# Patient Record
Sex: Female | Born: 1935
Health system: Southern US, Community
[De-identification: ages and names within clinical notes are randomized; demographics above are authoritative.]

## PROBLEM LIST (undated history)

## (undated) DIAGNOSIS — R0602 Shortness of breath: Secondary | ICD-10-CM

## (undated) DIAGNOSIS — T4145XA Adverse effect of unspecified anesthetic, initial encounter: Secondary | ICD-10-CM

## (undated) DIAGNOSIS — I10 Essential (primary) hypertension: Secondary | ICD-10-CM

## (undated) DIAGNOSIS — T8859XA Other complications of anesthesia, initial encounter: Secondary | ICD-10-CM

## (undated) DIAGNOSIS — I639 Cerebral infarction, unspecified: Secondary | ICD-10-CM

## (undated) DIAGNOSIS — E079 Disorder of thyroid, unspecified: Secondary | ICD-10-CM

## (undated) DIAGNOSIS — F329 Major depressive disorder, single episode, unspecified: Secondary | ICD-10-CM

## (undated) DIAGNOSIS — M1990 Unspecified osteoarthritis, unspecified site: Secondary | ICD-10-CM

## (undated) DIAGNOSIS — F32A Depression, unspecified: Secondary | ICD-10-CM

## (undated) DIAGNOSIS — G5600 Carpal tunnel syndrome, unspecified upper limb: Secondary | ICD-10-CM

## (undated) DIAGNOSIS — E785 Hyperlipidemia, unspecified: Secondary | ICD-10-CM

## (undated) HISTORY — DX: Major depressive disorder, single episode, unspecified: F32.9

## (undated) HISTORY — PX: BACK SURGERY: SHX140

## (undated) HISTORY — DX: Depression, unspecified: F32.A

## (undated) HISTORY — DX: Carpal tunnel syndrome, unspecified upper limb: G56.00

## (undated) HISTORY — DX: Hyperlipidemia, unspecified: E78.5

## (undated) HISTORY — DX: Shortness of breath: R06.02

## (undated) HISTORY — PX: CHOLECYSTECTOMY: SHX55

## (undated) HISTORY — PX: HERNIA REPAIR: SHX51

## (undated) HISTORY — PX: APPENDECTOMY: SHX54

---

## 2003-10-23 ENCOUNTER — Other Ambulatory Visit: Payer: Self-pay

## 2003-10-25 ENCOUNTER — Other Ambulatory Visit: Payer: Self-pay

## 2004-03-15 ENCOUNTER — Ambulatory Visit: Payer: Self-pay | Admitting: Internal Medicine

## 2004-12-01 ENCOUNTER — Emergency Department: Payer: Self-pay | Admitting: Emergency Medicine

## 2005-01-06 ENCOUNTER — Ambulatory Visit: Payer: Self-pay

## 2005-02-03 ENCOUNTER — Ambulatory Visit: Payer: Self-pay | Admitting: Internal Medicine

## 2005-03-27 ENCOUNTER — Ambulatory Visit: Payer: Self-pay | Admitting: General Surgery

## 2005-03-28 ENCOUNTER — Ambulatory Visit: Payer: Self-pay | Admitting: General Surgery

## 2005-10-30 ENCOUNTER — Ambulatory Visit: Payer: Self-pay | Admitting: Internal Medicine

## 2005-12-02 ENCOUNTER — Ambulatory Visit: Payer: Self-pay | Admitting: Internal Medicine

## 2006-02-05 ENCOUNTER — Ambulatory Visit: Payer: Self-pay | Admitting: Internal Medicine

## 2006-02-18 ENCOUNTER — Ambulatory Visit: Payer: Self-pay | Admitting: Internal Medicine

## 2006-02-25 ENCOUNTER — Ambulatory Visit: Payer: Self-pay | Admitting: Internal Medicine

## 2006-03-11 ENCOUNTER — Ambulatory Visit: Payer: Self-pay

## 2006-06-03 ENCOUNTER — Ambulatory Visit: Payer: Self-pay | Admitting: Family Medicine

## 2006-06-05 ENCOUNTER — Ambulatory Visit: Payer: Self-pay | Admitting: Family Medicine

## 2006-06-18 ENCOUNTER — Ambulatory Visit: Payer: Self-pay | Admitting: Obstetrics and Gynecology

## 2006-09-08 ENCOUNTER — Ambulatory Visit: Payer: Self-pay | Admitting: Obstetrics and Gynecology

## 2006-09-15 ENCOUNTER — Ambulatory Visit: Payer: Self-pay | Admitting: Gynecologic Oncology

## 2006-11-16 ENCOUNTER — Ambulatory Visit: Payer: Self-pay | Admitting: General Surgery

## 2006-11-25 ENCOUNTER — Ambulatory Visit: Payer: Self-pay | Admitting: Family Medicine

## 2006-12-26 ENCOUNTER — Ambulatory Visit: Payer: Self-pay | Admitting: General Practice

## 2007-01-05 ENCOUNTER — Ambulatory Visit: Payer: Self-pay | Admitting: Internal Medicine

## 2007-01-14 ENCOUNTER — Ambulatory Visit: Payer: Self-pay | Admitting: Pain Medicine

## 2007-02-11 ENCOUNTER — Ambulatory Visit: Payer: Self-pay | Admitting: Pain Medicine

## 2007-03-08 ENCOUNTER — Ambulatory Visit: Payer: Self-pay | Admitting: Physician Assistant

## 2007-03-22 ENCOUNTER — Ambulatory Visit: Payer: Self-pay | Admitting: Pain Medicine

## 2007-03-23 ENCOUNTER — Ambulatory Visit: Payer: Self-pay | Admitting: Pain Medicine

## 2007-05-18 ENCOUNTER — Ambulatory Visit: Payer: Self-pay | Admitting: Pain Medicine

## 2007-06-03 ENCOUNTER — Ambulatory Visit: Payer: Self-pay | Admitting: Physician Assistant

## 2007-07-13 ENCOUNTER — Ambulatory Visit: Payer: Self-pay | Admitting: Unknown Physician Specialty

## 2007-07-30 ENCOUNTER — Other Ambulatory Visit: Payer: Self-pay

## 2007-07-30 ENCOUNTER — Ambulatory Visit: Payer: Self-pay | Admitting: Unknown Physician Specialty

## 2007-08-03 ENCOUNTER — Inpatient Hospital Stay: Payer: Self-pay | Admitting: Unknown Physician Specialty

## 2007-08-05 ENCOUNTER — Other Ambulatory Visit: Payer: Self-pay

## 2007-08-12 ENCOUNTER — Encounter: Payer: Self-pay | Admitting: Internal Medicine

## 2008-04-11 ENCOUNTER — Emergency Department: Payer: Self-pay

## 2008-04-18 ENCOUNTER — Ambulatory Visit: Payer: Self-pay | Admitting: Internal Medicine

## 2008-10-17 ENCOUNTER — Ambulatory Visit: Payer: Self-pay | Admitting: Internal Medicine

## 2008-10-17 DIAGNOSIS — K8020 Calculus of gallbladder without cholecystitis without obstruction: Secondary | ICD-10-CM | POA: Insufficient documentation

## 2008-10-17 DIAGNOSIS — G4733 Obstructive sleep apnea (adult) (pediatric): Secondary | ICD-10-CM | POA: Insufficient documentation

## 2008-10-17 DIAGNOSIS — E039 Hypothyroidism, unspecified: Secondary | ICD-10-CM | POA: Insufficient documentation

## 2008-10-17 DIAGNOSIS — E663 Overweight: Secondary | ICD-10-CM | POA: Insufficient documentation

## 2008-10-17 DIAGNOSIS — E669 Obesity, unspecified: Secondary | ICD-10-CM | POA: Insufficient documentation

## 2008-10-17 DIAGNOSIS — I10 Essential (primary) hypertension: Secondary | ICD-10-CM | POA: Insufficient documentation

## 2008-10-17 DIAGNOSIS — R0602 Shortness of breath: Secondary | ICD-10-CM | POA: Insufficient documentation

## 2008-12-24 ENCOUNTER — Emergency Department: Payer: Self-pay | Admitting: Emergency Medicine

## 2009-11-21 ENCOUNTER — Other Ambulatory Visit: Payer: Self-pay | Admitting: Internal Medicine

## 2009-12-04 ENCOUNTER — Inpatient Hospital Stay: Payer: Self-pay | Admitting: Internal Medicine

## 2009-12-12 LAB — PATHOLOGY REPORT

## 2010-02-17 ENCOUNTER — Emergency Department: Payer: Self-pay | Admitting: Emergency Medicine

## 2010-05-05 ENCOUNTER — Emergency Department: Payer: Self-pay | Admitting: Emergency Medicine

## 2011-01-07 ENCOUNTER — Ambulatory Visit: Payer: Self-pay

## 2012-02-10 ENCOUNTER — Emergency Department: Payer: Self-pay | Admitting: Emergency Medicine

## 2012-02-10 LAB — URINALYSIS, COMPLETE
Bacteria: NONE SEEN
Bilirubin,UR: NEGATIVE
Blood: NEGATIVE
Glucose,UR: NEGATIVE mg/dL (ref 0–75)
Ketone: NEGATIVE
Leukocyte Esterase: NEGATIVE
Nitrite: NEGATIVE
Ph: 6 (ref 4.5–8.0)
Protein: NEGATIVE
RBC,UR: NONE SEEN /HPF (ref 0–5)
Specific Gravity: 1.005 (ref 1.003–1.030)
Squamous Epithelial: <1
WBC UR: NONE SEEN /HPF (ref 0–5)

## 2012-02-10 LAB — COMPREHENSIVE METABOLIC PANEL
Albumin: 3.6 g/dL (ref 3.4–5.0)
Alkaline Phosphatase: 111 U/L (ref 50–136)
Anion Gap: 9 (ref 7–16)
BUN: 20 mg/dL — ABNORMAL HIGH (ref 7–18)
Bilirubin,Total: 0.3 mg/dL (ref 0.2–1.0)
Calcium, Total: 8.9 mg/dL (ref 8.5–10.1)
Chloride: 103 mmol/L (ref 98–107)
Co2: 28 mmol/L (ref 21–32)
Creatinine: 0.99 mg/dL (ref 0.60–1.30)
EGFR (African American): >60
EGFR (Non-African Amer.): 55 — ABNORMAL LOW
Glucose: 107 mg/dL — ABNORMAL HIGH (ref 65–99)
Osmolality: 282 (ref 275–301)
Potassium: 4.1 mmol/L (ref 3.5–5.1)
SGOT(AST): 18 U/L (ref 15–37)
SGPT (ALT): 16 U/L (ref 12–78)
Sodium: 140 mmol/L (ref 136–145)
Total Protein: 6.9 g/dL (ref 6.4–8.2)

## 2012-02-10 LAB — TROPONIN I: Troponin-I: <0.02 ng/mL

## 2012-02-10 LAB — CK TOTAL AND CKMB (NOT AT ARMC)
CK, Total: 96 U/L (ref 21–215)
CK-MB: 2.2 ng/mL (ref 0.5–3.6)

## 2012-02-10 LAB — CBC
HCT: 41.7 % (ref 35.0–47.0)
HGB: 14.5 g/dL (ref 12.0–16.0)
MCH: 31.4 pg (ref 26.0–34.0)
MCHC: 34.6 g/dL (ref 32.0–36.0)
MCV: 91 fL (ref 80–100)
Platelet: 200 10*3/uL (ref 150–440)
RBC: 4.6 10*6/uL (ref 3.80–5.20)
RDW: 13.7 % (ref 11.5–14.5)
WBC: 6.3 10*3/uL (ref 3.6–11.0)

## 2012-02-10 LAB — PRO B NATRIURETIC PEPTIDE: B-Type Natriuretic Peptide: 82 pg/mL (ref 0–450)

## 2012-05-13 ENCOUNTER — Ambulatory Visit: Payer: Self-pay

## 2012-05-13 DIAGNOSIS — R0602 Shortness of breath: Secondary | ICD-10-CM

## 2012-07-01 ENCOUNTER — Ambulatory Visit: Payer: Self-pay

## 2012-08-18 ENCOUNTER — Ambulatory Visit: Payer: Self-pay | Admitting: Internal Medicine

## 2012-09-08 LAB — COMPREHENSIVE METABOLIC PANEL
Albumin: 3.5 g/dL (ref 3.4–5.0)
Alkaline Phosphatase: 105 U/L (ref 50–136)
Anion Gap: 8 (ref 7–16)
BUN: 20 mg/dL — ABNORMAL HIGH (ref 7–18)
Bilirubin,Total: 0.3 mg/dL (ref 0.2–1.0)
Calcium, Total: 8.9 mg/dL (ref 8.5–10.1)
Chloride: 103 mmol/L (ref 98–107)
Co2: 27 mmol/L (ref 21–32)
Creatinine: 1.13 mg/dL (ref 0.60–1.30)
EGFR (African American): 54 — ABNORMAL LOW
EGFR (Non-African Amer.): 47 — ABNORMAL LOW
Glucose: 104 mg/dL — ABNORMAL HIGH (ref 65–99)
Osmolality: 279 (ref 275–301)
Potassium: 3.9 mmol/L (ref 3.5–5.1)
SGOT(AST): 19 U/L (ref 15–37)
SGPT (ALT): 17 U/L (ref 12–78)
Sodium: 138 mmol/L (ref 136–145)
Total Protein: 6.7 g/dL (ref 6.4–8.2)

## 2012-09-08 LAB — URINALYSIS, COMPLETE
Bilirubin,UR: NEGATIVE
Blood: NEGATIVE
Glucose,UR: NEGATIVE mg/dL (ref 0–75)
Ketone: NEGATIVE
Leukocyte Esterase: NEGATIVE
Nitrite: NEGATIVE
Ph: 5 (ref 4.5–8.0)
Protein: NEGATIVE
RBC,UR: 1 /HPF (ref 0–5)
Specific Gravity: 1.019 (ref 1.003–1.030)
Squamous Epithelial: 4
WBC UR: 3 /HPF (ref 0–5)

## 2012-09-08 LAB — CK TOTAL AND CKMB (NOT AT ARMC)
CK, Total: 78 U/L (ref 21–215)
CK-MB: 1.7 ng/mL (ref 0.5–3.6)

## 2012-09-08 LAB — CBC
HCT: 43.7 % (ref 35.0–47.0)
HGB: 14.7 g/dL (ref 12.0–16.0)
MCH: 30.6 pg (ref 26.0–34.0)
MCHC: 33.7 g/dL (ref 32.0–36.0)
MCV: 91 fL (ref 80–100)
Platelet: 224 10*3/uL (ref 150–440)
RBC: 4.81 10*6/uL (ref 3.80–5.20)
RDW: 13.5 % (ref 11.5–14.5)
WBC: 6.3 10*3/uL (ref 3.6–11.0)

## 2012-09-08 LAB — TROPONIN I: Troponin-I: <0.02 ng/mL

## 2012-09-09 ENCOUNTER — Inpatient Hospital Stay: Payer: Self-pay | Admitting: Internal Medicine

## 2012-09-09 LAB — PRO B NATRIURETIC PEPTIDE: B-Type Natriuretic Peptide: 82 pg/mL (ref 0–450)

## 2012-09-09 LAB — TROPONIN I
Troponin-I: <0.02 ng/mL
Troponin-I: <0.02 ng/mL
Troponin-I: <0.02 ng/mL

## 2012-09-09 LAB — CK TOTAL AND CKMB (NOT AT ARMC)
CK, Total: 65 U/L (ref 21–215)
CK-MB: 1.2 ng/mL (ref 0.5–3.6)

## 2012-09-09 LAB — CK-MB
CK-MB: 1.3 ng/mL (ref 0.5–3.6)
CK-MB: 1.4 ng/mL (ref 0.5–3.6)

## 2012-09-10 LAB — BASIC METABOLIC PANEL
Anion Gap: 6 — ABNORMAL LOW (ref 7–16)
BUN: 20 mg/dL — ABNORMAL HIGH (ref 7–18)
Calcium, Total: 9 mg/dL (ref 8.5–10.1)
Chloride: 106 mmol/L (ref 98–107)
Co2: 27 mmol/L (ref 21–32)
Creatinine: 0.87 mg/dL (ref 0.60–1.30)
EGFR (African American): >60
EGFR (Non-African Amer.): >60
Glucose: 89 mg/dL (ref 65–99)
Osmolality: 280 (ref 275–301)
Potassium: 4.3 mmol/L (ref 3.5–5.1)
Sodium: 139 mmol/L (ref 136–145)

## 2012-09-10 LAB — TSH: Thyroid Stimulating Horm: 0.179 u[IU]/mL — ABNORMAL LOW

## 2012-09-24 ENCOUNTER — Ambulatory Visit: Payer: Self-pay

## 2013-03-28 ENCOUNTER — Emergency Department: Payer: Self-pay | Admitting: Emergency Medicine

## 2013-03-28 LAB — COMPREHENSIVE METABOLIC PANEL
Albumin: 3.3 g/dL — ABNORMAL LOW (ref 3.4–5.0)
Alkaline Phosphatase: 109 U/L (ref 50–136)
Anion Gap: 5 — ABNORMAL LOW (ref 7–16)
BUN: 22 mg/dL — ABNORMAL HIGH (ref 7–18)
Bilirubin,Total: 0.3 mg/dL (ref 0.2–1.0)
Calcium, Total: 8.8 mg/dL (ref 8.5–10.1)
Chloride: 108 mmol/L — ABNORMAL HIGH (ref 98–107)
Co2: 28 mmol/L (ref 21–32)
Creatinine: 1.15 mg/dL (ref 0.60–1.30)
EGFR (African American): 53 — ABNORMAL LOW
EGFR (Non-African Amer.): 46 — ABNORMAL LOW
Glucose: 101 mg/dL — ABNORMAL HIGH (ref 65–99)
Osmolality: 285 (ref 275–301)
Potassium: 3.8 mmol/L (ref 3.5–5.1)
SGOT(AST): 22 U/L (ref 15–37)
SGPT (ALT): 17 U/L (ref 12–78)
Sodium: 141 mmol/L (ref 136–145)
Total Protein: 6.8 g/dL (ref 6.4–8.2)

## 2013-03-28 LAB — CBC
HCT: 39.6 % (ref 35.0–47.0)
HGB: 13.3 g/dL (ref 12.0–16.0)
MCH: 30.9 pg (ref 26.0–34.0)
MCHC: 33.6 g/dL (ref 32.0–36.0)
MCV: 92 fL (ref 80–100)
Platelet: 217 10*3/uL (ref 150–440)
RBC: 4.31 10*6/uL (ref 3.80–5.20)
RDW: 14.2 % (ref 11.5–14.5)
WBC: 8.3 10*3/uL (ref 3.6–11.0)

## 2013-03-28 LAB — TROPONIN I: Troponin-I: <0.02 ng/mL

## 2013-03-28 LAB — CK TOTAL AND CKMB (NOT AT ARMC)
CK, Total: 132 U/L (ref 21–215)
CK-MB: 2.5 ng/mL (ref 0.5–3.6)

## 2013-03-28 LAB — PRO B NATRIURETIC PEPTIDE: B-Type Natriuretic Peptide: 102 pg/mL (ref 0–450)

## 2013-09-06 ENCOUNTER — Observation Stay: Payer: Self-pay | Admitting: Internal Medicine

## 2013-09-06 LAB — PROTIME-INR
INR: 0.9
Prothrombin Time: 12 secs (ref 11.5–14.7)

## 2013-09-06 LAB — BASIC METABOLIC PANEL
Anion Gap: 7 (ref 7–16)
BUN: 30 mg/dL — ABNORMAL HIGH (ref 7–18)
Calcium, Total: 9 mg/dL (ref 8.5–10.1)
Chloride: 102 mmol/L (ref 98–107)
Co2: 29 mmol/L (ref 21–32)
Creatinine: 0.99 mg/dL (ref 0.60–1.30)
EGFR (African American): >60
EGFR (Non-African Amer.): 55 — ABNORMAL LOW
Glucose: 109 mg/dL — ABNORMAL HIGH (ref 65–99)
Osmolality: 282 (ref 275–301)
Potassium: 3.6 mmol/L (ref 3.5–5.1)
Sodium: 138 mmol/L (ref 136–145)

## 2013-09-06 LAB — TROPONIN I
Troponin-I: <0.02 ng/mL
Troponin-I: <0.02 ng/mL

## 2013-09-06 LAB — CBC WITH DIFFERENTIAL/PLATELET
Basophil #: 0 10*3/uL (ref 0.0–0.1)
Basophil %: 0.6 %
Eosinophil #: 0.3 10*3/uL (ref 0.0–0.7)
Eosinophil %: 3.2 %
HCT: 43.8 % (ref 35.0–47.0)
HGB: 14.7 g/dL (ref 12.0–16.0)
Lymphocyte #: 2.1 10*3/uL (ref 1.0–3.6)
Lymphocyte %: 24 %
MCH: 31.3 pg (ref 26.0–34.0)
MCHC: 33.5 g/dL (ref 32.0–36.0)
MCV: 94 fL (ref 80–100)
Monocyte #: 0.8 x10 3/mm (ref 0.2–0.9)
Monocyte %: 9.1 %
Neutrophil #: 5.5 10*3/uL (ref 1.4–6.5)
Neutrophil %: 63.1 %
Platelet: 198 10*3/uL (ref 150–440)
RBC: 4.68 10*6/uL (ref 3.80–5.20)
RDW: 14.1 % (ref 11.5–14.5)
WBC: 8.7 10*3/uL (ref 3.6–11.0)

## 2013-09-06 LAB — URINALYSIS, COMPLETE
Bilirubin,UR: NEGATIVE
Glucose,UR: NEGATIVE mg/dL (ref 0–75)
Ketone: NEGATIVE
Nitrite: NEGATIVE
Ph: 5 (ref 4.5–8.0)
Protein: NEGATIVE
RBC,UR: 33 /HPF (ref 0–5)
Specific Gravity: 1.016 (ref 1.003–1.030)
Squamous Epithelial: 3
WBC UR: 12 /HPF (ref 0–5)

## 2013-09-06 LAB — APTT: Activated PTT: 25.4 secs (ref 23.6–35.9)

## 2013-09-06 LAB — TSH: Thyroid Stimulating Horm: 2.02 u[IU]/mL

## 2013-09-06 LAB — CK TOTAL AND CKMB (NOT AT ARMC)
CK, Total: 149 U/L
CK-MB: 3 ng/mL (ref 0.5–3.6)

## 2013-09-06 LAB — PRO B NATRIURETIC PEPTIDE: B-Type Natriuretic Peptide: 108 pg/mL (ref 0–450)

## 2013-09-07 ENCOUNTER — Ambulatory Visit: Payer: Self-pay | Admitting: Neurology

## 2013-09-07 LAB — LIPID PANEL
Cholesterol: 98 mg/dL (ref 0–200)
HDL Cholesterol: 43 mg/dL (ref 40–60)
Ldl Cholesterol, Calc: 42 mg/dL (ref 0–100)
Triglycerides: 63 mg/dL (ref 0–200)
VLDL Cholesterol, Calc: 13 mg/dL (ref 5–40)

## 2013-09-07 LAB — TROPONIN I
Troponin-I: <0.02 ng/mL
Troponin-I: <0.02 ng/mL

## 2013-09-07 LAB — HEMOGLOBIN A1C: Hemoglobin A1C: 5.9 % (ref 4.2–6.3)

## 2013-09-07 LAB — TSH: Thyroid Stimulating Horm: 1.18 u[IU]/mL

## 2013-09-26 ENCOUNTER — Ambulatory Visit: Payer: Self-pay

## 2013-11-22 ENCOUNTER — Emergency Department: Payer: Self-pay | Admitting: Emergency Medicine

## 2013-11-22 LAB — COMPREHENSIVE METABOLIC PANEL
Albumin: 3.4 g/dL (ref 3.4–5.0)
Alkaline Phosphatase: 81 U/L
Anion Gap: 9 (ref 7–16)
BUN: 35 mg/dL — ABNORMAL HIGH (ref 7–18)
Bilirubin,Total: 0.4 mg/dL (ref 0.2–1.0)
Calcium, Total: 8.3 mg/dL — ABNORMAL LOW (ref 8.5–10.1)
Chloride: 106 mmol/L (ref 98–107)
Co2: 25 mmol/L (ref 21–32)
Creatinine: 1.51 mg/dL — ABNORMAL HIGH (ref 0.60–1.30)
EGFR (African American): 38 — ABNORMAL LOW
EGFR (Non-African Amer.): 33 — ABNORMAL LOW
Glucose: 105 mg/dL — ABNORMAL HIGH (ref 65–99)
Osmolality: 288 (ref 275–301)
Potassium: 3.4 mmol/L — ABNORMAL LOW (ref 3.5–5.1)
SGOT(AST): 16 U/L (ref 15–37)
SGPT (ALT): 18 U/L (ref 12–78)
Sodium: 140 mmol/L (ref 136–145)
Total Protein: 6.4 g/dL (ref 6.4–8.2)

## 2013-11-22 LAB — URINALYSIS, COMPLETE
Bacteria: NONE SEEN
Bilirubin,UR: NEGATIVE
Blood: NEGATIVE
Glucose,UR: NEGATIVE mg/dL (ref 0–75)
Ketone: NEGATIVE
Leukocyte Esterase: NEGATIVE
Nitrite: NEGATIVE
Ph: 5 (ref 4.5–8.0)
Protein: NEGATIVE
RBC,UR: 1 /HPF (ref 0–5)
Specific Gravity: 1.018 (ref 1.003–1.030)
Squamous Epithelial: 1
WBC UR: 2 /HPF (ref 0–5)

## 2013-11-22 LAB — CBC
HCT: 40.1 % (ref 35.0–47.0)
HGB: 13.3 g/dL (ref 12.0–16.0)
MCH: 30.9 pg (ref 26.0–34.0)
MCHC: 33.1 g/dL (ref 32.0–36.0)
MCV: 93 fL (ref 80–100)
Platelet: 194 10*3/uL (ref 150–440)
RBC: 4.3 10*6/uL (ref 3.80–5.20)
RDW: 13.9 % (ref 11.5–14.5)
WBC: 6.5 10*3/uL (ref 3.6–11.0)

## 2013-11-22 LAB — LIPASE, BLOOD: Lipase: 155 U/L (ref 73–393)

## 2013-11-22 LAB — TROPONIN I
Troponin-I: <0.02 ng/mL
Troponin-I: <0.02 ng/mL

## 2014-07-07 DIAGNOSIS — M19011 Primary osteoarthritis, right shoulder: Secondary | ICD-10-CM | POA: Diagnosis not present

## 2014-07-07 DIAGNOSIS — M792 Neuralgia and neuritis, unspecified: Secondary | ICD-10-CM | POA: Diagnosis not present

## 2014-07-07 DIAGNOSIS — M545 Low back pain: Secondary | ICD-10-CM | POA: Diagnosis not present

## 2014-07-07 DIAGNOSIS — M79621 Pain in right upper arm: Secondary | ICD-10-CM | POA: Diagnosis not present

## 2014-07-07 DIAGNOSIS — E039 Hypothyroidism, unspecified: Secondary | ICD-10-CM | POA: Diagnosis not present

## 2014-07-07 DIAGNOSIS — M25511 Pain in right shoulder: Secondary | ICD-10-CM | POA: Diagnosis not present

## 2014-07-07 DIAGNOSIS — I10 Essential (primary) hypertension: Secondary | ICD-10-CM | POA: Diagnosis not present

## 2014-07-26 DIAGNOSIS — M1990 Unspecified osteoarthritis, unspecified site: Secondary | ICD-10-CM | POA: Diagnosis not present

## 2014-08-29 DIAGNOSIS — J45991 Cough variant asthma: Secondary | ICD-10-CM | POA: Diagnosis not present

## 2014-08-29 DIAGNOSIS — E039 Hypothyroidism, unspecified: Secondary | ICD-10-CM | POA: Diagnosis not present

## 2014-08-29 DIAGNOSIS — I10 Essential (primary) hypertension: Secondary | ICD-10-CM | POA: Diagnosis not present

## 2014-08-29 DIAGNOSIS — E782 Mixed hyperlipidemia: Secondary | ICD-10-CM | POA: Diagnosis not present

## 2014-08-29 DIAGNOSIS — E2839 Other primary ovarian failure: Secondary | ICD-10-CM | POA: Diagnosis not present

## 2014-08-29 DIAGNOSIS — E1122 Type 2 diabetes mellitus with diabetic chronic kidney disease: Secondary | ICD-10-CM | POA: Diagnosis not present

## 2014-08-29 DIAGNOSIS — I2510 Atherosclerotic heart disease of native coronary artery without angina pectoris: Secondary | ICD-10-CM | POA: Diagnosis not present

## 2014-08-29 DIAGNOSIS — R0602 Shortness of breath: Secondary | ICD-10-CM | POA: Diagnosis not present

## 2014-08-29 DIAGNOSIS — D518 Other vitamin B12 deficiency anemias: Secondary | ICD-10-CM | POA: Diagnosis not present

## 2014-08-31 ENCOUNTER — Ambulatory Visit
Admit: 2014-08-31 | Disposition: A | Discharge status: Home / Self Care | Payer: Self-pay | Attending: Internal Medicine | Admitting: Internal Medicine

## 2014-08-31 DIAGNOSIS — R0602 Shortness of breath: Secondary | ICD-10-CM | POA: Diagnosis not present

## 2014-09-01 NOTE — H&P (Signed)
PATIENT NAME:  Christy Carter, Christy Carter MR#:  [id number] DATE OF BIRTH:  01/29/1933  DATE OF ADMISSION:  09/08/2012  REFERRING PHYSICIAN: Loney Hering, MD  PRIMARY CARE PHYSICIAN: Lavera Guise, MD  CHIEF COMPLAINT: Shortness of breath, generalized weakness.   HISTORY OF PRESENT ILLNESS: The patient is a 81 year old pleasant white female with past medical history of hypertension, hyperlipidemia, hypothyroidism, obesity, who presented to the Emergency Department with complaints of severe generalized weakness, shortness of breath of a sudden onset while working at home. The patient has been having these symptoms of severe generalized weakness and shortness of breath associated with chest pain on the right side of the chest for the last 2 months. Per the patient, the patient underwent echocardiogram and stress test by Dr. Neoma Laming and planned to do the left heart cath this morning at 7:00 a.m. However, yesterday, the patient was cleaning the dishes and cleaning her house and started to experience severe generalized weakness and had to sit down. This was associated with palpitations, lightheadedness. The patient stated that the patient was about to pass out. This particular episode was not associated with chest pain; however, was in severe shortness of breath. Considering this, the patient was brought to the Emergency Department. Workup in the Emergency Department is unremarkable. Chest x-ray does not show any acute cardiopulmonary disease. Cardiac enzymes are well within normal limits. No obvious signs of infection. BNP is 82. When Emergency Department physician tried to walk her in the hallway, the patient's oxygen saturations dropped to 70s. Concerning this, contacted Dr. Neoma Laming, who recommended the patient to be admitted to the hospital and still plan to do the left heart cath considering the patient's persistence of the symptoms for the last few months.   PAST MEDICAL HISTORY:  1. Hypertension.   2. Hyperlipidemia.  3. Hypothyroidism.  4. Congestive heart failure.  5. Previous history of CVA.   PAST SURGICAL HISTORY:  1. Appendectomy.  2. Gallbladder.   3. Hernia repair x3.  4. Back surgery.   HOME MEDICATIONS:  1. Vytorin 10/40 mg 1 tablet once a day.  2. Omeprazole 20 mg once a day.  3. Levothyroxine 137 mcg 1 tablet once a day.  4. Ibuprofen 800 mg 3 times a day.  5. Hydrochlorothiazide 1 tablet once a day.  6. Escitalopram 10 mg once a day.  7. Bisoprolol 5 mg once a day.  8. Acetaminophen 1 tablet 3 times a day.   ALLERGIES: No known drug allergies.   SOCIAL HISTORY: Smoked until 1996, one pack a day. Denies drinking alcohol. Currently lives with her great-grandson. Also, another grandson lives right next door. The patient states independent of ADLs.   FAMILY HISTORY: Multiple family members with various gastrointestinal cancers. One brother with colon cancer and with other two with pancreatic and liver cancer.   REVIEW OF SYSTEMS:  CONSTITUTIONAL: Generalized fatigue, weakness. No weight loss.  EYES: No change in vision. No redness.  ENT: No tinnitus or ear pain. No sore throat.  RESPIRATORY: Has shortness of breath. No cough.  CARDIOVASCULAR: Dizziness, shortness of breath, chest pain frequent.  GASTROINTESTINAL: Mild nausea. No abdominal pain.  GENITOURINARY: No dysuria or hematuria.  MUSCULOSKELETAL: Has chronic back pain.  SKIN: No rash or lesions.  HEMATOLOGIC: No easy bruising or bleeding.  ENDOCRINE: No polyuria or polydipsia.  NEUROLOGIC: No weakness or numbness.  PSYCHIATRIC: No anxiety, insomnia.   PHYSICAL EXAMINATION:  GENERAL: This is a well-built, well-nourished, age-appropriate female lying down in the bed,  not in distress.  VITAL SIGNS: Temperature 97.6, pulse 67, 120/55, respiratory rate of 18, oxygen saturation is 98% on 2 liters of oxygen.  HEENT: Head normocephalic, atraumatic. Eyes: No sclerae icterus. Conjunctivae normal. Extraocular  movements are intact. Mucous membranes moist. No pharyngeal erythema.  NECK: Supple. No lymphadenopathy. No JVD. No carotid bruit. No thyromegaly.  CHEST: Has no focal tenderness.  LUNGS: Bilaterally clear to auscultation.  HEART: S1 and S2 regular. No murmurs are heard. Lower extremities: No pedal edema. Pulses present in dorsalis pedis bilaterally. ABDOMEN: Bowel sounds present. Soft, nontender, nondistended. Obese abdomen. Could not appreciate any hepatosplenomegaly.  SKIN: No rash or lesions.  LYMPHATIC: No cervical, axillary or inguinal lymphadenopathy.  NEUROLOGIC: The patient is alert, oriented to place, person and time. Cranial nerves II through XII intact. Motor 5/5 in upper and lower extremities.   LABORATORIES: CK-MB is 1.7. CMP is completely within normal limits. CBC is completely within normal limits. Cardiac enzymes: Troponin less than 0.02. Chest x-ray, PA and lateral: No acute cardiopulmonary disease. UA negative for nitrites and leukocyte esterase. CT head without contrast: No acute intracranial abnormality. BNP is 82. EKG, 12-lead: Normal sinus rhythm with no ST-T wave abnormalities.   ASSESSMENT AND PLAN: The patient is a 81 year old female who comes to the Emergency Department with generalized weakness of 2 months' duration associated with shortness of breath.   1. Generalized weakness. Concerned about there is some deconditioning. Uncertain about the patient being hypoxic, also could be contributing to some extent. Will also check TSH levels. Will involve the physical therapy. Cardiac enzymes have been negative. No obvious focal signs of weakness.  2. Shortness of breath. Again, cannot pinpoint any obvious cause of the shortness of breath. The patient has a normal BNP. No signs of any infection. The patient's chest x-ray does not indicate any obvious infiltrates or interstitial prominence. However, there is also concern about some component of obesity hypoventilation associated  with the muscle weakness, causing her to have low oxygen saturations with exertion. BNP is 82, which does not indicate the patient is in congestive heart failure. Considering the patient's ongoing symptoms associated with chest pain, the plan was to undergo left heart cath this morning, which will explain if this cause is from the cardiac. Will involve the physical therapy and occupational therapy as mentioned above.  3. Hypertension, currently well controlled.  4. Hypothyroidism. Continue Synthroid.  5. Keep the patient on deep vein thrombosis prophylaxis with Lovenox.   TIME SPENT: 50 minutes.   ____________________________ Monica Becton, MD pv:OSi D: 09/09/2012 08:22:00 ET T: 09/09/2012 08:42:16 ET JOB#: [id number]  cc: Monica Becton, MD, <Dictator> Lavera Guise, MD Grier Mitts Lianni Kanaan MD ELECTRONICALLY SIGNED 09/13/2012 6:54

## 2014-09-01 NOTE — Discharge Summary (Signed)
PATIENT NAME:  SAPHRONIA, OZDEMIR MR#:  [id number] DATE OF BIRTH:  01/29/1933  DATE OF ADMISSION:  09/09/2012 DATE OF DISCHARGE:  09/10/2012  PRESENTING COMPLAINT:  Generalized weakness with hypoxic.   DISCHARGE DIAGNOSES:  1.  Generalized weakness.  2.  Hypothyroidism overtreated.  3.  Morbid obesity.  4.  Hypoxia,  resolved.  5.  Catheter showed ejection fraction of 65% and minimal coronary artery disease.   CODE STATUS:  FULL CODE.   MEDICATIONS:  1.  Acetaminophen/hydrocodone 500/5, 1 tablet 3 times a day as needed.  2.  Hydrochlorothiazide/lisinopril 12.5/10 1 tablet daily.  3.  Escitalopram 10 mg daily.  4.  Vytorin 10/40, 1 tablet daily.  5.  Bisoprolol 5 mg daily.  5.  Omeprazole 20 mg daily.  7.  Ibuprofen 800 mg 1 tablet 3 times a day as needed.  8.  Levothyroxine, new dose has been change to 100 mcg p.o. daily.   PHYSICAL THERAPY:  Home physical therapy.   FOLLOW UP:  Dr. Clayborn Bigness on 09/21/2012 at 1:50 p.m.   CONSULTATION:  Dr. Nehemiah Massed, Dr. Ubaldo Glassing.   LABORATORY, DIAGNOSTIC, AND RADIOLOGICAL DATA:  1.  Basic metabolic panel within normal limits.  2.  TSH is 1.179.  3.  Vitamin B12 is results pending.  4.  Calcitriol results pending.  5.  CPK-MB is 1.4. Troponin x 3 is negative.   PROCEDURES: 1.  Cardiac catheterization showed minimal one-vessel CAD with 25% stenosis.  2.  CT of the head is essentially negative.  3.  Urinalysis negative for UTI. 4.  EKG normal sinus rhythm.  5.  CBC within normal limits.   HISTORY OF PRESENT ILLNESS:  The patient is a 81 year old, morbidly obese, Caucasian female with history of hypothyroidism, comes in with:  1.  Generalized weakness. It appears like chronic deconditioning. TSH level was found to be low due to overcorrection. Her Synthroid dose was decreased to 100 mcg. Physical therapy saw the patient and recommended home PT which has been set up. CT of the head was negative. No obvious neurologic signs or symptoms were noted.   2.  Chronic back pain with severe DJD and previous laminectomy. PT recommends walker, which has been prescribed.  3.  Chest pain with hypoxia, transient, that resolved. Cardiac catheterization was negative, showed minimal CAD, insignificant CAD. The patient is on aspirin and beta blockers.   4.  Hypertension, well controlled.  5.  Hypothyroidism. Continue Synthroid.   BRIEF SUMMARY OF HOSPITAL COURSE:  The patient is a 81 year old, morbidly obese, Caucasian female who has past medical history of hypertension and hypothyroidism who comes to the Emergency Room and was admitted with:  1.  Generalized weakness suspected due to chronic deconditioning. She was found to have low TSH where her thyroid seemed to be overcorrected. Her dose was adjusted. Physical therapy saw the patient and recommended home PT, which has been arranged.  2.  Transient hypoxia noted in the Emergency Room. No saturations were noted in the hospital. The patient is status post heart cath, is breathing at room air, which her saturations at room air were normal. No suspicion for PE. BNP was 82. No signs of infection. Normal chest x-ray. There is a possibility obstructive sleep apnea, which we will defer to primary care physician to do workup as outpatient.  3.  Chest pain with positive stress test. The patient did undergo a heart cath which showed insignificant CAD, one vessel.   4.  Hypertension, well controlled.  5.  Hypothyroidism.  Her Synthroid was adjusted. Hospital stay otherwise remained stable.   CODE STATUS: The patient remained a FULL CODE.   TIME SPENT:  40 minutes.  ____________________________ Hart Rochester Posey Pronto, MD sap:jm D: 09/11/2012 06:59:01 ET T: 09/11/2012 12:03:57 ET JOB#: [id number]  cc: Kenlei Safi A. Posey Pronto, MD, <Dictator> Javier Docker. Ubaldo Glassing, MD Lavera Guise, MD Corey Skains, MD Ilda Basset MD ELECTRONICALLY SIGNED 09/28/2012 15:19

## 2014-09-01 NOTE — Consult Note (Signed)
   General Aspect 81 yo female with history of shortness of breath weakness and fatigue. She presented with increasing symtpoms. She was scheduled for left cardiac cath this am per Dr. Nehemiah Massed. She has ruled out for an mi thus far with a normal troponin. CXR was unremarkable for chf. WHen ambulating, the patients sat dropped to 70%. She has improved with oxygen. She is currently hemodynaimcally stable. She denis orthopnea ofr pnd. Out patient funcitonal study was positive for inferior ischemia. Risk factors include hypertension, prior smoking and family history   Physical Exam:  GEN well nourished, obese   HEENT PERRL, hearing intact to voice   NECK supple   RESP normal resp effort  no use of accessory muscles  rhonchi   CARD Regular rate and rhythm  Normal, S1, S2  Murmur   Murmur Systolic   Systolic Murmur axilla   ABD denies tenderness  no hernia  normal BS   LYMPH negative neck, negative axillae   EXTR negative cyanosis/clubbing, negative edema   SKIN normal to palpation   NEURO cranial nerves intact, motor/sensory function intact   PSYCH A+O to time, place, person, anxious   Review of Systems:  Subjective/Chief Complaint shortness of breath   General: Fatigue  Weakness   Skin: No Complaints   ENT: No Complaints   Eyes: No Complaints   Neck: No Complaints   Respiratory: Short of breath  Wheezing   Cardiovascular: Tightness  Dyspnea  Orthopnea   Gastrointestinal: No Complaints   Genitourinary: No Complaints   Vascular: No Complaints   Musculoskeletal: No Complaints   Neurologic: No Complaints   Hematologic: No Complaints   Endocrine: No Complaints   Psychiatric: No Complaints   Review of Systems: All other systems were reviewed and found to be negative   Medications/Allergies Reviewed Medications/Allergies reviewed     UTI:    hyperlipidemia:    depression:    CHF:    Back Pain, Chronic:    Obesity:    Osteoarthritis:     Hypothyroidism:    Hypertension:    Lumbar laminectomy: 03-Aug-2007   Hernia Repair:    Appendectomy:    Cholecystectomy:   EKG:  EKG NSR   Abnormal NSSTTW changes   Interpretation no ischemia    No Known Allergies:    Impression 81 yo female with history of abnormal funcitonal studyt suggestive of inferior ischemia who was scheduled for out patient cardiac cath per Dr. Nehemiah Massed this am presednted lastpm to the er with complaints of weakness and shortness of breath. Ruiled  out for mi thus far. No chf on cxr. Hypoxic with ambulation but stabled at rest. On oxygen at rest and asyhmptomatic. Based on progressive symptoms and abnormal funcitonal study, left heaert cath is indicated to evaluate coronary anatomy and to guide further therapyh. COnsidration for right heart cath can be made based on symptoms and ouitcome of left heart cath.Pt aslohas ckd iii with gfr of 45   Plan 1. Contineu to rule out for mi 2. Oxygen per [place] 3. Proceed with cardiac cath this am with further recs after cath 4. Weight loss 5. No evidence of chf clinically or b y cxr 6 Class III-IV angina   Electronic Signatures: Teodoro Spray (MD)  (Signed 01-May-14 07:44)  Authored: General Aspect/Present Illness, History and Physical Exam, Review of System, Past Medical History, EKG , Allergies, Impression/Plan   Last Updated: 01-May-14 07:44 by Teodoro Spray (MD)

## 2014-09-02 NOTE — H&P (Signed)
PATIENT NAME:  HOWARD, PATTON MR#:  [id number] DATE OF BIRTH:  01/29/1933  DATE OF ADMISSION:  09/06/2013  PRIMARY CARE PHYSICIAN: Clayborn Bigness, MD.   CHIEF COMPLAINT: Acute onset of balance problems with chest pain.   HISTORY OF PRESENT ILLNESS: A 81 year old morbidly obese Caucasian female patient with history of hypertension, hyperlipidemia, hypothyroidism, chronic back pain with left-sided weakness from prior CVA presents to the  Emergency Room complaining of acute onset of generalized weakness, balance problems and chest pain. The patient mentioned that she got up to go to the bathroom. While returning from the bathroom, she acutely felt like she was drunk. Was having balance problems then developed chest pain. The patient states that it is difficult for her to explain how she felt at that moment, but had problems with balance; never had similar symptoms. She does ambulates with a walker, has chronic back pain, lower extremity weakness, along with left-sided weakness from prior stroke, which are unchanged at this time.   Her chest pain has resolved. She did have a cardiac cath last year with only mild CAD. Presently her neuro examination is normal.   The case was discussed by ER physician with Dr. Irish Elders of neurology. No TP advised.   PAST MEDICAL HISTORY: 1.  Hypertension.  2.  Hyperlipidemia.  3.  Hypothyroidism.  4.  Congestive heart failure.  5.  Prior history of CVA 10 years prior.   PAST SURGICAL HISTORY: Appendectomy, gallbladder surgery, hernia repair and back surgery.   HOME MEDICATIONS: 1.  Vytorin 10/40 one tablet daily.  2.  Omeprazole 20 mg a day.  3.  Levothyroxine 150 daily.  4.  Hydrochlorothiazide 1 tablet oral once a day.  5.  Vitamin B12 1000 mg oral daily.  6.  Multivitamin 1 tablet daily.  7.  Lisinopril 10 mg daily.   ALLERGIES: No known allergies.   SOCIAL HISTORY: The patient smoked until 1996 a pack a day, but quit. Does not drink alcohol. Lives with her  sister at this time, who has a terminal disease, and is taking care of her.   FAMILY HISTORY: Multiple family members with GI cancers, one brother with colon cancer.   REVIEW OF SYSTEMS:  CONSTITUTIONAL: Complains of generalized fatigue, weakness.  EYES: No blurred vision, pain, redness. ENT:  No tinnitus, ear pain, hearing loss. RESPIRATORY: No cough, wheeze, hemoptysis. CARDIOVASCULAR: Has chest pain, dizziness.  GASTROINTESTINAL: Mild nausea. GENITOURINARY: No dysuria, hematuria or frequency.  ENDOCRINE: No polyuria, nocturia or thyroid problems. HEMATOLOGIC AND LYMPHATIC: No anemia, easy bruising, bleeding. INTEGUMENT:  No acne, rash, lesion.  MUSCULOSKELETAL: Has chronic back pain, arthritis.  NEUROLOGIC: Has left-sided weakness from prior stroke.  PSYCHIATRIC:  No anxiety or depression.   PHYSICAL EXAMINATION: VITAL SIGNS: Shows temperature of 98, pulse 72, blood pressure 158/72, saturating 96% on room. GENERAL:  Morbidly obese Caucasian female patient, lying in bed, is comfortable, conversational, cooperative with exam.  PSYCHIATRIC:  Alert, oriented x 3. Mood and affect appropriate. Judgment intact, pleasant. HEENT:  Atraumatic, normocephalic.   Oral mucosa moist and pink. . No pallor noted. Pupils bilaterally equal and reactive to light.  NECK: Supple. No thyromegaly or palpable lymph nodes. Trachea midline. No carotid bruit, JVD. CARDIOVASCULAR: S1, S2, without any murmurs.  Peripheral pulses 2+, no edema.  RESPIRATORY: Normal work of breathing. Clear to auscultation on both sides. GASTROINTESTINAL:  Soft abdomen, nontender. Bowel sounds present. No hepatosplenomegaly palpable.  SKIN: Warm and dry. No petechiae, rash, ulcer. MUSCULOSKELETAL: No joint swelling, redness of  the large joints. Normal muscle tone.  NEUROLOGIC: Has left-sided weakness of 4/5 in upper left-sided, 5/5 on right side. Sensation  intact all over. Cerebellar signs intact. Cranial nerves II through XII are  intact.   LABORATORY STUDY:  Show glucose 109, BNP of 108, BUN 30, creatinine 0.99. Troponin less than 0.02. TSH of 2, WBC 8.7, hemoglobin 14.7, platelets of 198. INR 0.9, PTT of 25.4.   CT scan of the head without contrast shows mild diffuse cortical atrophy, nothing acute.   EKG shows normal sinus rhythm. Q waves in inferior leads. No acute ST elevation.   ASSESSMENT AND PLAN: 1.  Acute onset of generalized weakness and balance problems and then patient developed chest pain. I suspect the patient likely had vertigo, which was brief, although cannot say if this was transient ischemic attack or not. The case has been discussed with Dr. Irish Elders by emergency room physician. We will admit the patient onto the telemetry floor, get MRI, carotid Doppler and echocardiogram. We will start her on aspirin, statin. Check fasting lipid profile in the morning, have physical therapy see her and case manager consult and neurology consult. The patient will be on fall precautions.  2.  Chest pain. This was very brief, associated with her sense of vertigo. The patient did have cardiac catheterization last year with only mild coronary artery disease. Very unlikely this is acute coronary syndrome. We will check 2 more sets of cardiac enzymes and keep patient on telemetry while in the hospital.  3.  Hypertension. Continue medication.  4.  Chronic back pain with left-sided weakness. The patient is on fall precautions.  Physical therapy to see her.  5.  Deep vein thrombosis prophylaxis with Lovenox.  CODE STATUS:  FULL CODE.  TIME SPENT TODAY ON THIS CASE:  40 minutes.    ____________________________ Leia Alf Sartaj Hoskin, MD srs:ce D: 09/06/2013 18:58:33 ET T: 09/06/2013 19:12:12 ET JOB#: [id number]  cc: Alveta Heimlich R. Darvin Neighbours, MD, <Dictator> Lavera Guise, MD Leotis Pain, MD Neita Carp MD ELECTRONICALLY SIGNED 10/07/2013 14:09

## 2014-09-02 NOTE — Consult Note (Signed)
PATIENT NAME:  Christy Carter, Christy Carter MR#:  [id number] DATE OF BIRTH:  01/29/1933  DATE OF CONSULTATION:  09/07/2013  REFERRING PHYSICIAN:   CONSULTING PHYSICIAN:  Leotis Pain, MD  REASON FOR CONSULTATION: Transient ischemic attack.   HISTORY OF PRESENTING ILLNESS: A 81 year old female with past medical history of hypertension, hyperlipidemia, hypothyroidism, congestive heart failure, history of stroke in the 90s, presenting with generalized weakness and left-sided chest discomfort as well as some numbness in the left upper and left lower extremity. The patient last evening stood up to go to the bathroom and while returning from the bathroom, she felt, what she describes, as vertiginous room spinning after which she developed some chest discomfort, chest pressure with some left-sided numbness,  which has all resolved and the patient states she is currently at baseline. She had a stroke in the 1990s with some minimal left residual upper extremity weakness. At baseline, she is not on antiplatelet medication. The patient is status post cardiac catheter last year with only mild coronary artery disease.  PAST MEDICAL HISTORY: Hypertension, hyperlipidemia, hypothyroidism, congestive heart failure and history of stroke in the 1990s.   PAST SURGICAL HISTORY: Appendectomy, gallbladder surgery, hernia repair and back surgery.   HOME MEDICATIONS: Include Vytorin, omeprazole, levothyroxine, hydrochlorothiazide, vitamin B12, multivitamin and lisinopril.   ALLERGIES: No known allergies.   SOCIAL HISTORY: Quit smoking in the 1990s No EtOH use. No drug use. The patient states she lives by herself, but she has family members visiting her daily; at least 2 to 3 people are at her house daily she states and assist her with her daily chores and her daily activities.   FAMILY HISTORY: Significant for GI cancer and colon cancer.  REVIEW OF SYSTEMS: CONSTITUTIONAL: Currently back to baseline. No fatigue. EYES: No blurry  vision. No pain. No redness. ENT: No tinnitus. No ear pain. No hearing loss. RESPIRATORY: No cough. No wheezing. No hemoptysis. CARDIOVASCULAR: Has resolution of her chest discomfort. ENDOCRINE: No polyuria. No nocturia. No polydipsia.  HEMATOLOGIC: No heat or cold intolerance. MUSCULOSKELETAL: Generalized pain that has resolved. Some arthritis and chronic back pain. NEUROLOGIC: History of left upper extremity weakness that is chronic from prior stroke. No history of anxiety or depression.   IMAGING: MRI of the brain: No acute intracranial abnormalities. CAT scan: No acute intracranial abnormalities.   LABORATORY: Laboratory workup has been reviewed. Hemoglobin A1c is 5.9.   PHYSICAL EXAMINATION:  VITAL SIGNS: Include a temperature of 97.5, pulse 68, respirations 18, blood pressure 118/76, pulse oximetry 94% on room air.  NEUROLOGIC: The patient is alert, awake, oriented to time, date, location, the reason why she is in the hospital. Speech appears to be clear and no signs of dysarthria or aphasia are present. Extraocular movements are intact. Visual fields intact bilaterally. Facial sensation intact. Facial motor intact. Tongue is midline. Uvula elevates symmetrically. Shoulder shrug intact. Motor strength left upper extremity is 4+ out of 5, left lower extremities 4+ out of  5. The rest of her extremities on the right upper and right lower extremities are 5 out of 5. Sensation intact to light touch and temperature bilaterally. Reflexes 1+ and diminished throughout. Coordination: Finger-to-nose intact. Gait not assessed.   IMPRESSION: A 81 year old female with past medical history of hypertension, hyperlipidemia, hypothyroidism, congestive heart failure, prior stroke in the 1990s with residual left upper extremity weakness, presenting with sudden episode of dizziness, chest discomfort and numbness on the left side, which have all resolved. The patient is currently back to baseline. The patient  is status  post imaging, which included MRI of the brain that did not show any acute intracranial abnormality. The patient's troponin and other cardiac workup is negative so far. Hemoglobin A1c is within normal limits. The patient is being evaluated by physical therapy at this point.   PLAN: From a neurological standpoint, I think the patient can be discharged home today. The patient lives by herself  and takes care of her usual activities by herself, but she has a lot of grandkids and children that visit her daily; she states at least 2 to 3 grandkids and children are at her house daily assisting her with her daily activities. The patient was not on any antiplatelet at home. I agree with continuation of aspirin 81 mg and continue the current statin that the patient has.   Thank you. It was a pleasure seeing this patient.  ____________________________ Leotis Pain, MD yz:aw D: 09/07/2013 11:14:45 ET T: 09/07/2013 11:50:33 ET JOB#: [id number]  cc: Leotis Pain, MD, <Dictator> Leotis Pain MD ELECTRONICALLY SIGNED 10/10/2013 18:37

## 2014-09-02 NOTE — Discharge Summary (Signed)
PATIENT NAME:  Christy Carter, Christy Carter MR#:  [id number] DATE OF BIRTH:  01/29/1933  DATE OF ADMISSION:  09/06/2013 DATE OF DISCHARGE:  09/07/2013  ADMISSION DIAGNOSIS: 1.  Possible transient ischemic attack.  DISCHARGE DIAGNOSES: 1.  Dizziness.  2.  Chest pain.  3.  History of hypertension.  4.  History of hypothyroidism.   CONSULTATIONS:  Neurology.   IMAGING: MRI of the brain showed no evidence of acute intracranial hemorrhage or CVA.  Carotid Dopplers: negative for hemodynamic significant stenosis. ECHO EF65-70% with [place] no Thrmobii seen.  LABORATORY DATA:  LDL 42. Cholesterol is 98.   TSH 1.18.  Troponin x 3 were negative.   HOSPITAL COURSE:  A 81 year old very pleasant female presented with dizziness and vague chest pain after eating lunch with her sister. For further details, please refer to the H and P.  1.  Dizziness with acute onset of generalized weakness and balance problems. I suspect this is actually related to maybe her dizziness rather than any neurological event such as TIA or CVA. Neurology consultation was obtained as well and it was not felt that this was an acute neurological event.    2.  Chest pain. This did not appear to be cardiac in nature. Troponins were negative. EKG showed no acute changes. Her telemetry was normal. She should follow up with her primary care physician.  3.  Urinary tract infection. It did appear the patient must have a urinary tract infection from her UA.  I started her on some antibiotics and UA can be repeated in 7 days.  4.  Hypertension. The patient is to continue on outpatient medications. 5.  Hyperlipidemia. Her LDL is at goal.  6.  Hypothyroidism. The patient is to continue on outpatient medications.   DISCHARGE MEDICATIONS: 1.  Tylenol Arthritis 650, 2 tablets t.i.d. p.r.n.  2.  Vytorin 10/40 daily.  3.  Hydrochlorothiazide/lisinopril 12.5/10 daily.  4.  Aspirin 81 mg daily.  5.  Ventolin HFA 2 puffs 4 times a day as needed.  6.   Omeprazole 20 mg daily.  7.  Synthroid 100 mcg daily.  8.  Multivitamin 1 tablet daily.  9.  B12, 1 tablet daily.  10.  Keflex 500 mg p.o. q.8 hours x 7 days.   DISCHARGE DIET:  Low sodium.   DISCHARGE ACTIVITY:  As tolerated.   DISCHARGE FOLLOWUP:  The patient will follow up with Dr. Clayborn Bigness in 1 week.    TIME SPENT: 35 minutes.   The patient is medically stable for discharge.    ____________________________ Sidhant Helderman P. Benjie Karvonen, MD spm:dmm D: 09/07/2013 11:31:05 ET T: 09/07/2013 13:33:17 ET JOB#: [id number]  cc: Jamylah Marinaccio P. Benjie Karvonen, MD, <Dictator> Lavera Guise, MD Vertis Bauder P Josiyah Tozzi MD ELECTRONICALLY SIGNED 09/07/2013 20:20

## 2015-01-23 DIAGNOSIS — I10 Essential (primary) hypertension: Secondary | ICD-10-CM | POA: Diagnosis not present

## 2015-01-23 DIAGNOSIS — E2839 Other primary ovarian failure: Secondary | ICD-10-CM | POA: Diagnosis not present

## 2015-01-23 DIAGNOSIS — E039 Hypothyroidism, unspecified: Secondary | ICD-10-CM | POA: Diagnosis not present

## 2015-01-23 DIAGNOSIS — E782 Mixed hyperlipidemia: Secondary | ICD-10-CM | POA: Diagnosis not present

## 2015-01-23 DIAGNOSIS — Z0001 Encounter for general adult medical examination with abnormal findings: Secondary | ICD-10-CM | POA: Diagnosis not present

## 2015-01-23 DIAGNOSIS — Z0000 Encounter for general adult medical examination without abnormal findings: Secondary | ICD-10-CM | POA: Diagnosis not present

## 2015-01-24 ENCOUNTER — Other Ambulatory Visit: Payer: Self-pay | Admitting: Nurse Practitioner

## 2015-01-24 DIAGNOSIS — Z1231 Encounter for screening mammogram for malignant neoplasm of breast: Secondary | ICD-10-CM

## 2015-02-02 ENCOUNTER — Encounter: Payer: Self-pay | Admitting: Emergency Medicine

## 2015-02-02 ENCOUNTER — Emergency Department: Payer: Commercial Managed Care - HMO

## 2015-02-02 ENCOUNTER — Emergency Department
Admission: EM | Admit: 2015-02-02 | Discharge: 2015-02-02 | Disposition: A | Discharge status: Home / Self Care | Payer: Commercial Managed Care - HMO | Attending: Emergency Medicine | Admitting: Emergency Medicine

## 2015-02-02 DIAGNOSIS — Z87891 Personal history of nicotine dependence: Secondary | ICD-10-CM | POA: Diagnosis not present

## 2015-02-02 DIAGNOSIS — Z7982 Long term (current) use of aspirin: Secondary | ICD-10-CM | POA: Insufficient documentation

## 2015-02-02 DIAGNOSIS — Z79899 Other long term (current) drug therapy: Secondary | ICD-10-CM | POA: Diagnosis not present

## 2015-02-02 DIAGNOSIS — M79672 Pain in left foot: Secondary | ICD-10-CM | POA: Insufficient documentation

## 2015-02-02 DIAGNOSIS — I10 Essential (primary) hypertension: Secondary | ICD-10-CM | POA: Insufficient documentation

## 2015-02-02 HISTORY — DX: Essential (primary) hypertension: I10

## 2015-02-02 HISTORY — DX: Cerebral infarction, unspecified: I63.9

## 2015-02-02 HISTORY — DX: Unspecified osteoarthritis, unspecified site: M19.90

## 2015-02-02 HISTORY — DX: Disorder of thyroid, unspecified: E07.9

## 2015-02-02 MED ORDER — HYDROCODONE-ACETAMINOPHEN 5-325 MG PO TABS: 1.0000 | ORAL_TABLET | ORAL | Status: DC | PRN

## 2015-02-02 MED ORDER — NAPROXEN 500 MG PO TABS
500.0000 mg | ORAL_TABLET | Freq: Two times a day (BID) | ORAL | Status: DC
Start: 2015-02-02 — End: 2018-02-12

## 2015-02-02 NOTE — ED Notes (Signed)
Redness to top of left foot  No injury ambulates well with walker

## 2015-02-02 NOTE — ED Provider Notes (Signed)
[facility] Emergency Department Provider Note  ____________________________________________  Time seen: Approximately 3:23 PM  I have reviewed the triage vital signs and the nursing notes.   HISTORY  Chief Complaint Foot Pain  HPI Christy Carter is a 81 y.o. female is here with complaint of redness on the top of her left foot. Patient states that it began last evening and has gotten worse during the night making it painful for her to walk today. She denies any previous problems such as this and denies any injury to her foot. She denies any known knowledge of gout in her family and is not experienced it herself. Pain is 7 out of 10 and constant. Walking makes it worse, elevating helps a little.   Past Medical History  Diagnosis Date  . Hypertension   . Thyroid disease   . Stroke   . Arthritis     Patient Active Problem List   Diagnosis Date Noted  . HYPOTHYROIDISM 10/17/2008  . OVERWEIGHT/OBESITY 10/17/2008  . SLEEP APNEA, OBSTRUCTIVE 10/17/2008  . HYPERTENSION, UNSPECIFIED 10/17/2008  . GALLSTONES 10/17/2008  . DYSPNEA 10/17/2008    Past Surgical History  Procedure Laterality Date  . Cholecystectomy    . Appendectomy    . Hernia repair    . Back surgery      Current Outpatient Rx  Name  Route  Sig  Dispense  Refill  . aspirin 81 MG tablet   Oral   Take 81 mg by mouth daily.         Marland Kitchen gabapentin (NEURONTIN) 100 MG capsule   Oral   Take 100 mg by mouth 2 (two) times daily.         Marland Kitchen levothyroxine (SYNTHROID, LEVOTHROID) 100 MCG tablet   Oral   Take 100 mcg by mouth daily before breakfast.         . omeprazole (PRILOSEC) 20 MG capsule   Oral   Take 20 mg by mouth daily.         Marland Kitchen triamterene-hydrochlorothiazide (MAXZIDE-25) 37.5-25 MG per tablet   Oral   Take 1 tablet by mouth daily.         Marland Kitchen HYDROcodone-acetaminophen (NORCO/VICODIN) 5-325 MG per tablet   Oral   Take 1 tablet by mouth every 4 (four) hours as needed for  moderate pain.   20 tablet   0   . naproxen (NAPROSYN) 500 MG tablet   Oral   Take 1 tablet (500 mg total) by mouth 2 (two) times daily with a meal.   30 tablet   0     Allergies Review of patient's allergies indicates no known allergies.  No family history on file.  Social History Social History  Substance Use Topics  . Smoking status: Former Research scientist (life sciences)  . Smokeless tobacco: None  . Alcohol Use: No    Review of Systems Constitutional: No fever/chills Eyes: No visual changes. ENT: No sore throat. Cardiovascular: Denies chest pain. Respiratory: Denies shortness of breath. Gastrointestinal: .  No nausea, no vomiting.  Genitourinary: Negative for dysuria. Musculoskeletal: Negative for back pain. Skin: Negative for rash. Erythema left foot Neurological: Negative for headaches, focal weakness or numbness.  10-point ROS otherwise negative.  ____________________________________________   PHYSICAL EXAM:  VITAL SIGNS: ED Triage Vitals  Enc Vitals Group     BP 02/02/15 1423 147/63 mmHg     Pulse Rate 02/02/15 1423 107     Resp 02/02/15 1423 20     Temp 02/02/15 1423 98.1 F (36.7  C)     Temp Source 02/02/15 1423 Oral     SpO2 02/02/15 1423 98 %     Weight 02/02/15 1423 230 lb (104.327 kg)     Height 02/02/15 1423 5\' 4"  (1.626 m)     Head Cir --      Peak Flow --      Pain Score 02/02/15 1424 7     Pain Loc --      Pain Edu? --      Excl. in [place]? --     Constitutional: Alert and oriented. Well appearing and in no acute distress. Eyes: Conjunctivae are normal. PERRL. EOMI. Head: Atraumatic. Nose: No congestion/rhinnorhea. Neck: No stridor.   Cardiovascular: Normal rate, regular rhythm. Grossly normal heart sounds.  Good peripheral circulation. Respiratory: Normal respiratory effort.  No retractions. Lungs CTAB. Gastrointestinal: Soft and nontender. No distention. Musculoskeletal left foot no gross deformity as well as ankle. There is moderate tenderness on  palpation of the ankle. Minimal edema present. No evidence of injury. Range of motion with flexion and extension is possible that I'm called for. Neurologic:  Normal speech and language. No gross focal neurologic deficits are appreciated. No gait instability. Skin:  Skin is warm, dry and intact. Moderate erythema with warmth. There is no evidence of abrasion or open skin. Psychiatric: Mood and affect are normal. Speech and behavior are normal.  ____________________________________________   LABS (all labs ordered are listed, but only abnormal results are displayed)  Labs Reviewed - No data to display RADIOLOGY  left x-ray per radiologist showed mild spurring of the posterior calcaneus ____________________________________________   PROCEDURES  Procedure(s) performed: None  Critical Care performed: No  ____________________________________________   INITIAL IMPRESSION / ASSESSMENT AND PLAN / ED COURSE  Pertinent labs & imaging results that were available during my care of the patient were reviewed by me and considered in my medical decision making (see chart for details).  Patient was placed on naproxen twice a day with food, Norco as needed for pain. Patient is to follow-up with her primary care doctor. She is aware this possibly could be gout. She is also advised that the pain medication could cause stress in his which will increase her chances of following. ____________________________________________   FINAL CLINICAL IMPRESSION(S) / ED DIAGNOSES  Final diagnoses:  Acute pain of left foot      Johnn Hai, PA-C 02/02/15 1642  Harvest Dark, MD 02/07/15 1500

## 2015-02-02 NOTE — ED Notes (Signed)
Pt states that last night she was sitting in her recliner and states that the top of her left foot started to hurt and has cont to hurt throughout the night, she was unable to sleep, pt has redness to the top of the foot

## 2015-02-02 NOTE — Discharge Instructions (Signed)
FOLLOW UP WITH YOUR DOCTOR NEXT WEEK IF PAIN IS NOT IMPROVING.  THIS IS SUSPICIOUS FOR GOUT TAKE MEDICATION ONLY AS DIRECTED.  BE AWARE THE PAIN MEDICATION COULD CAUSE DROWSINESS AND INCREASE YOUR RISK OF FALLING

## 2015-02-15 ENCOUNTER — Ambulatory Visit: Payer: Self-pay | Attending: Nurse Practitioner

## 2015-09-08 ENCOUNTER — Encounter: Payer: Self-pay | Admitting: Emergency Medicine

## 2015-09-08 ENCOUNTER — Emergency Department
Admission: EM | Admit: 2015-09-08 | Discharge: 2015-09-08 | Disposition: A | Discharge status: Home / Self Care | Payer: Commercial Managed Care - HMO | Attending: Emergency Medicine | Admitting: Emergency Medicine

## 2015-09-08 ENCOUNTER — Emergency Department: Payer: Commercial Managed Care - HMO

## 2015-09-08 DIAGNOSIS — E669 Obesity, unspecified: Secondary | ICD-10-CM | POA: Diagnosis not present

## 2015-09-08 DIAGNOSIS — Z87891 Personal history of nicotine dependence: Secondary | ICD-10-CM | POA: Diagnosis not present

## 2015-09-08 DIAGNOSIS — S299XXA Unspecified injury of thorax, initial encounter: Secondary | ICD-10-CM | POA: Diagnosis not present

## 2015-09-08 DIAGNOSIS — W19XXXA Unspecified fall, initial encounter: Secondary | ICD-10-CM

## 2015-09-08 DIAGNOSIS — Z79899 Other long term (current) drug therapy: Secondary | ICD-10-CM | POA: Insufficient documentation

## 2015-09-08 DIAGNOSIS — M549 Dorsalgia, unspecified: Secondary | ICD-10-CM | POA: Insufficient documentation

## 2015-09-08 DIAGNOSIS — M1990 Unspecified osteoarthritis, unspecified site: Secondary | ICD-10-CM | POA: Insufficient documentation

## 2015-09-08 DIAGNOSIS — Z8673 Personal history of transient ischemic attack (TIA), and cerebral infarction without residual deficits: Secondary | ICD-10-CM | POA: Insufficient documentation

## 2015-09-08 DIAGNOSIS — S40012A Contusion of left shoulder, initial encounter: Secondary | ICD-10-CM | POA: Diagnosis not present

## 2015-09-08 DIAGNOSIS — E039 Hypothyroidism, unspecified: Secondary | ICD-10-CM | POA: Diagnosis not present

## 2015-09-08 DIAGNOSIS — I10 Essential (primary) hypertension: Secondary | ICD-10-CM | POA: Insufficient documentation

## 2015-09-08 DIAGNOSIS — S40011A Contusion of right shoulder, initial encounter: Secondary | ICD-10-CM | POA: Diagnosis not present

## 2015-09-08 DIAGNOSIS — S3991XA Unspecified injury of abdomen, initial encounter: Secondary | ICD-10-CM | POA: Diagnosis not present

## 2015-09-08 DIAGNOSIS — S0990XA Unspecified injury of head, initial encounter: Secondary | ICD-10-CM | POA: Diagnosis not present

## 2015-09-08 DIAGNOSIS — Y92009 Unspecified place in unspecified non-institutional (private) residence as the place of occurrence of the external cause: Secondary | ICD-10-CM

## 2015-09-08 DIAGNOSIS — M545 Low back pain: Secondary | ICD-10-CM | POA: Diagnosis not present

## 2015-09-08 DIAGNOSIS — T07XXXA Unspecified multiple injuries, initial encounter: Secondary | ICD-10-CM

## 2015-09-08 DIAGNOSIS — S199XXA Unspecified injury of neck, initial encounter: Secondary | ICD-10-CM | POA: Diagnosis not present

## 2015-09-08 MED ORDER — MORPHINE SULFATE (PF) 4 MG/ML IV SOLN
4.0000 mg | Freq: Once | INTRAVENOUS | Status: DC
  Filled 2015-09-08: qty 1

## 2015-09-08 MED ORDER — HYDROCODONE-ACETAMINOPHEN 5-325 MG PO TABS: 1.0000 | ORAL_TABLET | ORAL | Status: DC | PRN

## 2015-09-08 MED ORDER — MORPHINE SULFATE (PF) 4 MG/ML IV SOLN
4.0000 mg | Freq: Once | INTRAVENOUS | Status: AC
  Administered 2015-09-08: 4 mg via INTRAVENOUS

## 2015-09-08 NOTE — ED Notes (Signed)
Pt arrived by EMS after an unwitnessed fall at home. Pt states she missed the step and fell backward on to step and hit between the shoulder blades. Pt states she did not have LOC. Pt has chronic pain.

## 2015-09-08 NOTE — Discharge Instructions (Signed)
You have been seen in the emergency department today after a fall. Your CT scans do not show any acute fractures. You have likely suffered from a bad bruise to your back. Please take your home medications as written by your primary care doctor. Return to the emergency department for any increased pain, or any other symptoms such as chest pain or abdominal pain. Otherwise please follow-up with your primary care physician in the next 2-3 days for recheck/reevaluation.   Fall Prevention in the Home  Falls can cause injuries and can affect people from all age groups. There are many simple things that you can do to make your home safe and to help prevent falls.  WHAT CAN I DO ON THE OUTSIDE OF MY HOME?  Regularly repair the edges of walkways and driveways and fix any cracks.  Remove high doorway thresholds.  Trim any shrubbery on the main path into your home.  Use bright outdoor lighting.  Clear walkways of debris and clutter, including tools and rocks.  Regularly check that handrails are securely fastened and in good repair. Both sides of any steps should have handrails.  Install guardrails along the edges of any raised decks or porches.  Have leaves, snow, and ice cleared regularly.  Use sand or salt on walkways during winter months.  In the garage, clean up any spills right away, including grease or oil spills. WHAT CAN I DO IN THE BATHROOM?  Use night lights.  Install grab bars by the toilet and in the tub and shower. Do not use towel bars as grab bars.  Use non-skid mats or decals on the floor of the tub or shower.  If you need to sit down while you are in the shower, use a plastic, non-slip stool.Marland Kitchen  Keep the floor dry. Immediately clean up any water that spills on the floor.  Remove soap buildup in the tub or shower on a regular basis.  Attach bath mats securely with double-sided non-slip rug tape.  Remove throw rugs and other tripping hazards from the floor. WHAT CAN I DO IN THE BEDROOM?    Use night lights.  Make sure that a bedside light is easy to reach.  Do not use oversized bedding that drapes onto the floor.  Have a firm chair that has side arms to use for getting dressed.  Remove throw rugs and other tripping hazards from the floor. WHAT CAN I DO IN THE KITCHEN?  Clean up any spills right away.  Avoid walking on wet floors.  Place frequently used items in easy-to-reach places.  If you need to reach for something above you, use a sturdy step stool that has a grab bar.  Keep electrical cables out of the way.  Do not use floor polish or wax that makes floors slippery. If you have to use wax, make sure that it is non-skid floor wax.  Remove throw rugs and other tripping hazards from the floor. WHAT CAN I DO IN THE STAIRWAYS?  Do not leave any items on the stairs.  Make sure that there are handrails on both sides of the stairs. Fix handrails that are broken or loose. Make sure that handrails are as long as the stairways.  Check any carpeting to make sure that it is firmly attached to the stairs. Fix any carpet that is loose or worn.  Avoid having throw rugs at the top or bottom of stairways, or secure the rugs with carpet tape to prevent them from moving.  Make  sure that you have a light switch at the top of the stairs and the bottom of the stairs. If you do not have them, have them installed. WHAT ARE SOME OTHER FALL PREVENTION TIPS?  Wear closed-toe shoes that fit well and support your feet. Wear shoes that have rubber soles or low heels.  When you use a stepladder, make sure that it is completely opened and that the sides are firmly locked. Have someone hold the ladder while you are using it. Do not climb a closed stepladder.  Add color or contrast paint or tape to grab bars and handrails in your home. Place contrasting color strips on the first and last steps.  Use mobility aids as needed, such as canes, walkers, scooters, and crutches.  Turn on lights if it is dark.  Replace any light bulbs that burn out.  Set up furniture so that there are clear paths. Keep the furniture in the same spot.  Fix any uneven floor surfaces.  Choose a carpet design that does not hide the edge of steps of a stairway.  Be aware of any and all pets.  Review your medicines with your healthcare provider. Some medicines can cause dizziness or changes in blood pressure, which increase your risk of falling. Talk with your health care provider about other ways that you can decrease your risk of falls. This may include working with a physical therapist or trainer to improve your strength, balance, and endurance.  This information is not intended to replace advice given to you by your health care provider. Make sure you discuss any questions you have with your health care provider.  Document Released: 04/18/2002 Document Revised: 09/12/2014 Document Reviewed: 06/02/2014  Elsevier Interactive Patient Education [employer].

## 2015-09-08 NOTE — ED Notes (Signed)
Pt verbalized understanding of discharge instructions. NAD at this time. 

## 2015-09-08 NOTE — ED Provider Notes (Signed)
[facility] Emergency Department Provider Note  Time seen: 2:18 PM  I have reviewed the triage vital signs and the nursing notes.   HISTORY  Chief Complaint Fall and Back Pain    HPI Christy Carter is a 82 y.o. female with a past medical history of hypertension, CVA, arthritis presents the emergency department after a fall at home. According to the patient she was walking outside to feed some squirrels when she tripped falling backwards. Patient states a cement step hit her in the back between her shoulder blades and that is where she is having muscular pain. Denies any head injury or LOC. Denies abdominal pain patient does have a degree of chronic pain. Patient describes her pain as moderate located mostly in her back between her shoulder blades. States it comes in spasms.     Past Medical History  Diagnosis Date  . Hypertension   . Thyroid disease   . Stroke ([place])   . Arthritis     Patient Active Problem List   Diagnosis Date Noted  . HYPOTHYROIDISM 10/17/2008  . OVERWEIGHT/OBESITY 10/17/2008  . SLEEP APNEA, OBSTRUCTIVE 10/17/2008  . HYPERTENSION, UNSPECIFIED 10/17/2008  . GALLSTONES 10/17/2008  . DYSPNEA 10/17/2008    Past Surgical History  Procedure Laterality Date  . Cholecystectomy    . Appendectomy    . Hernia repair    . Back surgery      Current Outpatient Rx  Name  Route  Sig  Dispense  Refill  . gabapentin (NEURONTIN) 100 MG capsule   Oral   Take 100 mg by mouth 2 (two) times daily.         Marland Kitchen levothyroxine (SYNTHROID, LEVOTHROID) 100 MCG tablet   Oral   Take 100 mcg by mouth daily before breakfast.         . omeprazole (PRILOSEC) 20 MG capsule   Oral   Take 20 mg by mouth daily.         . simvastatin (ZOCOR) 20 MG tablet   Oral   Take 20 mg by mouth at bedtime.         . triamterene-hydrochlorothiazide (MAXZIDE-25) 37.5-25 MG per tablet   Oral   Take 1 tablet by mouth daily.         Marland Kitchen  HYDROcodone-acetaminophen (NORCO/VICODIN) 5-325 MG per tablet   Oral   Take 1 tablet by mouth every 4 (four) hours as needed for moderate pain.   20 tablet   0   . naproxen (NAPROSYN) 500 MG tablet   Oral   Take 1 tablet (500 mg total) by mouth 2 (two) times daily with a meal.   30 tablet   0     Allergies Review of patient's allergies indicates no known allergies.  History reviewed. No pertinent family history.  Social History Social History  Substance Use Topics  . Smoking status: Former Research scientist (life sciences)  . Smokeless tobacco: None  . Alcohol Use: No    Review of Systems Constitutional: Negative for fever. Cardiovascular: Negative for chest pain. Respiratory: Negative for shortness of breath. Gastrointestinal: Negative for abdominal pain Musculoskeletal: Positive for back pain after a fall. Skin: Negative for rash. Negative for abrasion/laceration Neurological: Negative for headache. Negative for focal weakness or numbness. 10-point ROS otherwise negative.  ____________________________________________   PHYSICAL EXAM:  VITAL SIGNS: ED Triage Vitals  Enc Vitals Group     BP 09/08/15 1314 166/71 mmHg     Pulse Rate 09/08/15 1314 80     Resp 09/08/15  1314 18     Temp --      Temp src --      SpO2 09/08/15 1312 95 %     Weight 09/08/15 1314 225 lb (102.059 kg)     Height 09/08/15 1314 5\' 7"  (1.702 m)     Head Cir --      Peak Flow --      Pain Score 09/08/15 1315 0     Pain Loc --      Pain Edu? --      Excl. in [place]? --     Constitutional: Alert and oriented. Well appearing and in no distress. Eyes: Normal exam ENT   Head: Normocephalic and atraumatic.   Mouth/Throat: Mucous membranes are moist. Cardiovascular: Normal rate, regular rhythm. No murmur Respiratory: Normal respiratory effort without tachypnea nor retractions. Breath sounds are clear  Gastrointestinal: Soft and nontender. No distention.   Musculoskeletal: Mild to moderate tenderness palpation  over the mid thoracic spine but appears to be more in the paraspinal muscles than midline. No cervical tenderness. Minimal to no lumbar tenderness. Patient has good range of motion in all extremities including lower extremities, stable pelvis good range of motion in her hips without tenderness. Neurologic:  Normal speech and language. No gross focal neurologic deficits Skin:  Skin is warm, dry and intact.  Psychiatric: Mood and affect are normal.   ____________________________________________   RADIOLOGY  IMPRESSION: Anterior vertebral ossification throughout the thoracic spine suggestive of DISH. Nondisplaced fracture through the ALL ossification at T7-8 of indeterminate age. No extension centrally into the vertebral body or into the posterior elements.  ____________________________________________    INITIAL IMPRESSION / ASSESSMENT AND PLAN / ED COURSE  Pertinent labs & imaging results that were available during my care of the patient were reviewed by me and considered in my medical decision making (see chart for details).  Patient presents the emergency department after a fall at home. Patient denies LOC. States her pain is in her mid back between her shoulder blades. Given the patient's age we'll proceed to CT imaging to help further evaluate the patient's back pain. Patient received 1 dose of IV morphine and is now sleeping comfortably. No acute abnormality and CTs, possible fracture at the ossification of a ligament. Patient will follow up with PCP. ____________________________________________   FINAL CLINICAL IMPRESSION(S) / ED DIAGNOSES  Fall Back pain   Harvest Dark, MD 09/12/15 2241

## 2016-01-25 DIAGNOSIS — Z23 Encounter for immunization: Secondary | ICD-10-CM | POA: Diagnosis not present

## 2016-01-25 DIAGNOSIS — Z0001 Encounter for general adult medical examination with abnormal findings: Secondary | ICD-10-CM | POA: Diagnosis not present

## 2016-01-25 DIAGNOSIS — E039 Hypothyroidism, unspecified: Secondary | ICD-10-CM | POA: Diagnosis not present

## 2016-01-25 DIAGNOSIS — I10 Essential (primary) hypertension: Secondary | ICD-10-CM | POA: Diagnosis not present

## 2016-01-25 DIAGNOSIS — M150 Primary generalized (osteo)arthritis: Secondary | ICD-10-CM | POA: Diagnosis not present

## 2016-01-25 DIAGNOSIS — M5137 Other intervertebral disc degeneration, lumbosacral region: Secondary | ICD-10-CM | POA: Diagnosis not present

## 2016-02-11 DIAGNOSIS — E2839 Other primary ovarian failure: Secondary | ICD-10-CM | POA: Diagnosis not present

## 2016-06-09 DIAGNOSIS — M109 Gout, unspecified: Secondary | ICD-10-CM | POA: Diagnosis not present

## 2016-07-03 ENCOUNTER — Other Ambulatory Visit: Payer: Self-pay | Admitting: Nurse Practitioner

## 2016-07-03 ENCOUNTER — Ambulatory Visit
Admission: RE | Admit: 2016-07-03 | Discharge: 2016-07-03 | Disposition: A | Discharge status: Home / Self Care | Payer: Medicare HMO | Source: Ambulatory Visit | Attending: Nurse Practitioner | Admitting: Nurse Practitioner

## 2016-07-03 DIAGNOSIS — L03116 Cellulitis of left lower limb: Secondary | ICD-10-CM | POA: Diagnosis not present

## 2016-07-03 DIAGNOSIS — M79605 Pain in left leg: Secondary | ICD-10-CM

## 2016-07-03 DIAGNOSIS — N390 Urinary tract infection, site not specified: Secondary | ICD-10-CM | POA: Diagnosis not present

## 2016-07-03 DIAGNOSIS — E039 Hypothyroidism, unspecified: Secondary | ICD-10-CM | POA: Diagnosis not present

## 2016-07-03 DIAGNOSIS — I10 Essential (primary) hypertension: Secondary | ICD-10-CM | POA: Diagnosis not present

## 2016-07-03 DIAGNOSIS — M79672 Pain in left foot: Secondary | ICD-10-CM | POA: Diagnosis not present

## 2016-08-25 ENCOUNTER — Emergency Department: Payer: Medicare HMO

## 2016-08-25 ENCOUNTER — Emergency Department
Admission: EM | Admit: 2016-08-25 | Discharge: 2016-08-25 | Disposition: A | Discharge status: Home / Self Care | Payer: Medicare HMO | Attending: Emergency Medicine | Admitting: Emergency Medicine

## 2016-08-25 ENCOUNTER — Encounter: Payer: Self-pay | Admitting: *Deleted

## 2016-08-25 DIAGNOSIS — M25552 Pain in left hip: Secondary | ICD-10-CM

## 2016-08-25 DIAGNOSIS — M1612 Unilateral primary osteoarthritis, left hip: Secondary | ICD-10-CM | POA: Insufficient documentation

## 2016-08-25 DIAGNOSIS — E039 Hypothyroidism, unspecified: Secondary | ICD-10-CM | POA: Diagnosis not present

## 2016-08-25 DIAGNOSIS — Z87891 Personal history of nicotine dependence: Secondary | ICD-10-CM | POA: Insufficient documentation

## 2016-08-25 DIAGNOSIS — Z79899 Other long term (current) drug therapy: Secondary | ICD-10-CM | POA: Diagnosis not present

## 2016-08-25 DIAGNOSIS — I10 Essential (primary) hypertension: Secondary | ICD-10-CM | POA: Diagnosis not present

## 2016-08-25 MED ORDER — TRAMADOL HCL 50 MG PO TABS: 50.0000 mg | ORAL_TABLET | Freq: Three times a day (TID) | ORAL | 0 refills | Status: DC | PRN

## 2016-08-25 MED ORDER — TRAMADOL HCL 50 MG PO TABS
50.0000 mg | ORAL_TABLET | Freq: Once | ORAL | Status: AC
  Administered 2016-08-25: 50 mg via ORAL
  Filled 2016-08-25: qty 1

## 2016-08-25 NOTE — ED Provider Notes (Signed)
[facility] Emergency Department Provider Note   ____________________________________________   First MD Initiated Contact with Patient 08/25/16 1140     (approximate)  I have reviewed the triage vital signs and the nursing notes.   HISTORY  Chief Complaint Hip Pain    HPI Christy Carter is a 83 y.o. female is here complaining of left hip pain. Patient states that last week she has had increased hip pain. She denies any fall or injury to her hip. She states she is aware that  she does have arthritis. She generally uses her walker at home. Patient states currently she is not on any pain medication. She has taken in the past before but not any recently. She rates her pain as an 8 out of 10 at this time. Patient is ambulatory with the use of a prolonged cane.   Past Medical History:  Diagnosis Date  . Arthritis   . Hypertension   . Stroke ([place])   . Thyroid disease     Patient Active Problem List   Diagnosis Date Noted  . HYPOTHYROIDISM 10/17/2008  . OVERWEIGHT/OBESITY 10/17/2008  . SLEEP APNEA, OBSTRUCTIVE 10/17/2008  . HYPERTENSION, UNSPECIFIED 10/17/2008  . GALLSTONES 10/17/2008  . DYSPNEA 10/17/2008    Past Surgical History:  Procedure Laterality Date  . APPENDECTOMY    . BACK SURGERY    . CHOLECYSTECTOMY    . HERNIA REPAIR      Prior to Admission medications   Medication Sig Start Date End Date Taking? Authorizing Provider  gabapentin (NEURONTIN) 100 MG capsule Take 100 mg by mouth 2 (two) times daily.    Historical Provider, MD  HYDROcodone-acetaminophen (NORCO/VICODIN) 5-325 MG tablet Take 1 tablet by mouth every 4 (four) hours as needed for moderate pain. 09/08/15   Harvest Dark, MD  levothyroxine (SYNTHROID, LEVOTHROID) 100 MCG tablet Take 100 mcg by mouth daily before breakfast.    Historical Provider, MD  naproxen (NAPROSYN) 500 MG tablet Take 1 tablet (500 mg total) by mouth 2 (two) times daily with a meal. 02/02/15   Johnn Hai, PA-C  omeprazole (PRILOSEC) 20 MG capsule Take 20 mg by mouth daily.    Historical Provider, MD  simvastatin (ZOCOR) 20 MG tablet Take 20 mg by mouth at bedtime.    Historical Provider, MD  traMADol (ULTRAM) 50 MG tablet Take 1 tablet (50 mg total) by mouth every 8 (eight) hours as needed. 08/25/16   Johnn Hai, PA-C  triamterene-hydrochlorothiazide (MAXZIDE-25) 37.5-25 MG per tablet Take 1 tablet by mouth daily.    Historical Provider, MD    Allergies Patient has no known allergies.  History reviewed. No pertinent family history.  Social History Social History  Substance Use Topics  . Smoking status: Former Research scientist (life sciences)  . Smokeless tobacco: Not on file  . Alcohol use No    Review of Systems Constitutional: No fever/chills Eyes: No visual changes. Cardiovascular: Denies chest pain. Respiratory: Denies shortness of breath. Gastrointestinal: No abdominal pain.  No nausea, no vomiting.   Genitourinary: Negative for dysuria. Musculoskeletal: Positive for left hip pain. Skin: Negative for rash. Endocrine:  Hypothyroidism Neurological: Negative for headaches, focal weakness or numbness.  10-point ROS otherwise negative.  ____________________________________________   PHYSICAL EXAM:  VITAL SIGNS: ED Triage Vitals [08/25/16 0937]  Enc Vitals Group     BP (!) 162/58     Pulse Rate 73     Resp 18     Temp 98 F (36.7 C)  Temp Source Oral     SpO2 95 %     Weight 220 lb (99.8 kg)     Height 5\' 7"  (1.702 m)     Head Circumference      Peak Flow      Pain Score 8     Pain Loc      Pain Edu?      Excl. in [place]?     Constitutional: Alert and oriented. Well appearing and in no acute distress. Eyes: Conjunctivae are normal. PERRL. EOMI. Head: Atraumatic. Nose: No congestion/rhinnorhea. Neck: No stridor.   Cardiovascular: Normal rate, regular rhythm. Grossly normal heart sounds.  Good peripheral circulation. Respiratory: Normal respiratory effort.  No  retractions. Lungs CTAB. Gastrointestinal: Soft and nontender. Obese. Musculoskeletal: On exam there is no point tenderness to palpation. There is no pain with abduction or abduction. Patient rotated slightly experienced pain. There is no shortening of her lower extremities and negative for rotation of the left leg. Patient is able to weight-bear with the use of a cane. Neurologic:  Normal speech and language. No gross focal neurologic deficits are appreciated.  Skin:  Skin is warm, dry and intact. No ecchymosis, abrasions, erythema or soft tissue swelling. Psychiatric: Mood and affect are normal. Speech and behavior are normal.  ____________________________________________   LABS (all labs ordered are listed, but only abnormal results are displayed)  Labs Reviewed - No data to display  RADIOLOGY Left hip x-ray per radiologist: IMPRESSION:  Bilateral hip joint degenerative changes.    No fracture or dislocation.  I, Johnn Hai, personally viewed and evaluated these images (plain radiographs) as part of my medical decision making, as well as reviewing the written report by the radiologist.  ____________________________________________   PROCEDURES  Procedure(s) performed: None  Procedures  Critical Care performed: No  ____________________________________________   INITIAL IMPRESSION / ASSESSMENT AND PLAN / ED COURSE  Pertinent labs & imaging results that were available during my care of the patient were reviewed by me and considered in my medical decision making (see chart for details).  Patient was reassured that her degenerative arthritis is the only thing that was seen on x-ray. Patient was given a prescription for tramadol 50 mg 1 every 8 hours as needed for pain. She was given one in the department prior to discharge. Patient is aware that this medication can cause drowsiness increase her risk for falling. She states that she generally she uses her walker when she  is up at home. She'll follow-up with her PCP if any continued problems.    ___________________________________________   FINAL CLINICAL IMPRESSION(S) / ED DIAGNOSES  Final diagnoses:  Left hip pain  Osteoarthritis of left hip, unspecified osteoarthritis type      NEW MEDICATIONS STARTED DURING THIS VISIT:  Discharge Medication List as of 08/25/2016  1:12 PM    START taking these medications   Details  traMADol (ULTRAM) 50 MG tablet Take 1 tablet (50 mg total) by mouth every 8 (eight) hours as needed., Starting Mon 08/25/2016, Print         Note:  This document was prepared using Dragon voice recognition software and may include unintentional dictation errors.    Johnn Hai, PA-C 08/25/16 1740    Lavonia Drafts, MD 08/26/16 [phone]

## 2016-08-25 NOTE — Discharge Instructions (Signed)
Follow-up with primary care doctor if any continued problems. Take tramadol as directed. Be aware that this medication could cause drowsiness and increase your risk for falling. Use your walker when up walking.

## 2016-08-25 NOTE — ED Triage Notes (Signed)
States left hip pain for 1 week, pt uses walker all the time, denies any fall or injury, states hx of arthritis

## 2016-08-25 NOTE — ED Notes (Signed)
See triage note  Developed pain to left hip about 2 weeks ago   w/o trauma

## 2016-10-01 DIAGNOSIS — I10 Essential (primary) hypertension: Secondary | ICD-10-CM | POA: Diagnosis not present

## 2016-10-01 DIAGNOSIS — E039 Hypothyroidism, unspecified: Secondary | ICD-10-CM | POA: Diagnosis not present

## 2016-10-01 DIAGNOSIS — M064 Inflammatory polyarthropathy: Secondary | ICD-10-CM | POA: Diagnosis not present

## 2016-10-20 ENCOUNTER — Other Ambulatory Visit: Payer: Self-pay | Admitting: Internal Medicine

## 2016-10-20 DIAGNOSIS — R0602 Shortness of breath: Secondary | ICD-10-CM

## 2016-10-28 ENCOUNTER — Ambulatory Visit
Admission: RE | Admit: 2016-10-28 | Discharge: 2016-10-28 | Disposition: A | Discharge status: Home / Self Care | Payer: Medicare HMO | Source: Ambulatory Visit | Attending: Internal Medicine | Admitting: Internal Medicine

## 2016-10-28 DIAGNOSIS — I10 Essential (primary) hypertension: Secondary | ICD-10-CM | POA: Diagnosis not present

## 2016-10-28 DIAGNOSIS — R0602 Shortness of breath: Secondary | ICD-10-CM | POA: Insufficient documentation

## 2016-10-28 DIAGNOSIS — I071 Rheumatic tricuspid insufficiency: Secondary | ICD-10-CM | POA: Insufficient documentation

## 2016-10-28 DIAGNOSIS — E079 Disorder of thyroid, unspecified: Secondary | ICD-10-CM | POA: Diagnosis not present

## 2016-10-28 DIAGNOSIS — Z8673 Personal history of transient ischemic attack (TIA), and cerebral infarction without residual deficits: Secondary | ICD-10-CM | POA: Insufficient documentation

## 2016-10-28 NOTE — Progress Notes (Signed)
*  PRELIMINARY RESULTS* Echocardiogram 2D Echocardiogram has been performed.  Christy Carter 10/28/2016, 10:14 AM

## 2016-11-24 ENCOUNTER — Other Ambulatory Visit
Admission: RE | Admit: 2016-11-24 | Discharge: 2016-11-24 | Disposition: A | Discharge status: Home / Self Care | Payer: Medicare HMO | Source: Ambulatory Visit | Attending: Nurse Practitioner | Admitting: Nurse Practitioner

## 2016-11-24 DIAGNOSIS — D518 Other vitamin B12 deficiency anemias: Secondary | ICD-10-CM | POA: Diagnosis not present

## 2016-11-24 DIAGNOSIS — R5383 Other fatigue: Secondary | ICD-10-CM | POA: Insufficient documentation

## 2016-11-24 DIAGNOSIS — E538 Deficiency of other specified B group vitamins: Secondary | ICD-10-CM | POA: Diagnosis not present

## 2016-11-24 DIAGNOSIS — M792 Neuralgia and neuritis, unspecified: Secondary | ICD-10-CM | POA: Diagnosis not present

## 2016-11-24 DIAGNOSIS — E039 Hypothyroidism, unspecified: Secondary | ICD-10-CM | POA: Insufficient documentation

## 2016-11-24 DIAGNOSIS — E782 Mixed hyperlipidemia: Secondary | ICD-10-CM | POA: Diagnosis not present

## 2016-11-24 DIAGNOSIS — F330 Major depressive disorder, recurrent, mild: Secondary | ICD-10-CM | POA: Diagnosis not present

## 2016-11-24 DIAGNOSIS — M064 Inflammatory polyarthropathy: Secondary | ICD-10-CM | POA: Diagnosis not present

## 2016-11-24 LAB — CBC
HCT: 44 % (ref 35.0–47.0)
Hemoglobin: 14.8 g/dL (ref 12.0–16.0)
MCH: 29.5 pg (ref 26.0–34.0)
MCHC: 33.7 g/dL (ref 32.0–36.0)
MCV: 87.5 fL (ref 80.0–100.0)
Platelets: 226 10*3/uL (ref 150–440)
RBC: 5.02 MIL/uL (ref 3.80–5.20)
RDW: 14.7 % — ABNORMAL HIGH (ref 11.5–14.5)
WBC: 5.9 10*3/uL (ref 3.6–11.0)

## 2016-11-24 LAB — COMPREHENSIVE METABOLIC PANEL
ALT: 13 U/L — ABNORMAL LOW (ref 14–54)
AST: 22 U/L (ref 15–41)
Albumin: 3.7 g/dL (ref 3.5–5.0)
Alkaline Phosphatase: 95 U/L (ref 38–126)
Anion gap: 9 (ref 5–15)
BUN: 23 mg/dL — ABNORMAL HIGH (ref 6–20)
CO2: 26 mmol/L (ref 22–32)
Calcium: 9.1 mg/dL (ref 8.9–10.3)
Chloride: 103 mmol/L (ref 101–111)
Creatinine, Ser: 1.03 mg/dL — ABNORMAL HIGH (ref 0.44–1.00)
GFR calc Af Amer: 57 mL/min — ABNORMAL LOW (ref >60)
GFR calc non Af Amer: 50 mL/min — ABNORMAL LOW (ref >60)
Glucose, Bld: 115 mg/dL — ABNORMAL HIGH (ref 65–99)
Potassium: 3.7 mmol/L (ref 3.5–5.1)
Sodium: 138 mmol/L (ref 135–145)
Total Bilirubin: 0.5 mg/dL (ref 0.3–1.2)
Total Protein: 6.7 g/dL (ref 6.5–8.1)

## 2016-11-24 LAB — FERRITIN: Ferritin: 42 ng/mL (ref 11–307)

## 2016-11-24 LAB — FOLATE: Folate: 17 ng/mL (ref >5.9)

## 2016-11-24 LAB — TSH: TSH: 1.419 u[IU]/mL (ref 0.350–4.500)

## 2016-11-24 LAB — VITAMIN B12: Vitamin B-12: 1885 pg/mL — ABNORMAL HIGH (ref 180–914)

## 2016-11-24 LAB — T4, FREE: Free T4: 0.8 ng/dL (ref 0.61–1.12)

## 2017-05-18 ENCOUNTER — Encounter: Payer: Self-pay | Admitting: Nurse Practitioner

## 2017-05-19 ENCOUNTER — Ambulatory Visit: Payer: Self-pay | Admitting: Nurse Practitioner

## 2017-05-22 DIAGNOSIS — R300 Dysuria: Secondary | ICD-10-CM | POA: Diagnosis not present

## 2017-05-27 ENCOUNTER — Encounter: Payer: Self-pay | Admitting: Internal Medicine

## 2017-05-27 ENCOUNTER — Ambulatory Visit (INDEPENDENT_AMBULATORY_CARE_PROVIDER_SITE_OTHER): Payer: Medicare HMO | Admitting: Nurse Practitioner

## 2017-05-27 VITALS — BP 173/72 | HR 72 | Temp 96.3°F | Resp 16 | Ht 68.0 in | Wt 244.6 lb

## 2017-05-27 DIAGNOSIS — B373 Candidiasis of vulva and vagina: Secondary | ICD-10-CM | POA: Diagnosis not present

## 2017-05-27 DIAGNOSIS — N390 Urinary tract infection, site not specified: Secondary | ICD-10-CM | POA: Diagnosis not present

## 2017-05-27 DIAGNOSIS — J069 Acute upper respiratory infection, unspecified: Secondary | ICD-10-CM

## 2017-05-27 DIAGNOSIS — I10 Essential (primary) hypertension: Secondary | ICD-10-CM | POA: Diagnosis not present

## 2017-05-27 DIAGNOSIS — N3001 Acute cystitis with hematuria: Secondary | ICD-10-CM

## 2017-05-27 DIAGNOSIS — R062 Wheezing: Secondary | ICD-10-CM

## 2017-05-27 DIAGNOSIS — B3731 Acute candidiasis of vulva and vagina: Secondary | ICD-10-CM

## 2017-05-27 LAB — POCT URINALYSIS DIPSTICK
Bilirubin, UA: NEGATIVE
Blood, UA: NEGATIVE
Glucose, UA: NEGATIVE
Ketones, UA: NEGATIVE
Nitrite, UA: NEGATIVE
Spec Grav, UA: 1.01 (ref 1.010–1.025)
Urobilinogen, UA: 0.2 E.U./dL
pH, UA: 5 (ref 5.0–8.0)

## 2017-05-27 MED ORDER — ALBUTEROL SULFATE HFA 108 (90 BASE) MCG/ACT IN AERS: 2.0000 | INHALATION_SPRAY | Freq: Four times a day (QID) | RESPIRATORY_TRACT | 1 refills | Status: DC | PRN

## 2017-05-27 MED ORDER — FLUCONAZOLE 150 MG PO TABS: ORAL_TABLET | ORAL | 1 refills | Status: DC

## 2017-05-27 MED ORDER — NYSTATIN 100000 UNIT/GM EX OINT: 1.0000 "application " | TOPICAL_OINTMENT | Freq: Three times a day (TID) | CUTANEOUS | 2 refills | Status: DC

## 2017-05-27 MED ORDER — LEVOFLOXACIN 500 MG PO TABS: 500.0000 mg | ORAL_TABLET | Freq: Every day | ORAL | 0 refills | Status: DC

## 2017-05-27 NOTE — Progress Notes (Signed)
[facility] [place], [place] [address]  Internal MEDICINE  Office Visit Note  Patient Name: Christy Carter  [id number]  [id number]  Date of Service: 05/27/2017  Chief Complaint  Patient presents with  . Vaginal Itching    burning with urination    The patient states that she was seen at walk-in clinic on Friday. Was treated with flagyl for bacterial vagintis. Symptoms have not resolved. They did send a urine sample for culture and sensitivity. They called her this morning to say that no bacteria grew in culture.    Other  This is a new problem. The current episode started 1 to 4 weeks ago. The problem occurs constantly. The problem has been unchanged. Associated symptoms include abdominal pain, congestion, coughing, fatigue, headaches, urinary symptoms and weakness. Associated symptoms comments: Wheezing, especially at night. . Nothing aggravates the symptoms. Treatments tried: OTC medication for yeast infection.  The treatment provided no relief.    Pt is here for routine follow up.    Current Medication: Outpatient Encounter Medications as of 05/27/2017  Medication Sig  . gabapentin (NEURONTIN) 100 MG capsule Take 100 mg by mouth 2 (two) times daily.  Marland Kitchen HYDROcodone-acetaminophen (NORCO/VICODIN) 5-325 MG tablet Take 1 tablet by mouth every 4 (four) hours as needed for moderate pain.  Marland Kitchen levothyroxine (SYNTHROID, LEVOTHROID) 100 MCG tablet Take 100 mcg by mouth daily before breakfast.  . naproxen (NAPROSYN) 500 MG tablet Take 1 tablet (500 mg total) by mouth 2 (two) times daily with a meal.  . omeprazole (PRILOSEC) 20 MG capsule Take 20 mg by mouth daily.  . simvastatin (ZOCOR) 20 MG tablet Take 20 mg by mouth at bedtime.  . traMADol (ULTRAM) 50 MG tablet Take 1 tablet (50 mg total) by mouth every 8 (eight) hours as needed.  . triamterene-hydrochlorothiazide (MAXZIDE-25) 37.5-25 MG per tablet Take 1 tablet by mouth daily.   No facility-administered  encounter medications on file as of 05/27/2017.     Surgical History: Past Surgical History:  Procedure Laterality Date  . APPENDECTOMY    . BACK SURGERY    . CHOLECYSTECTOMY    . HERNIA REPAIR      Medical History: Past Medical History:  Diagnosis Date  . Arthritis   . Hypertension   . Stroke ([place])   . Thyroid disease     Family History: History reviewed. No pertinent family history.  Social History   Socioeconomic History  . Marital status: Widowed    Spouse name: Not on file  . Number of children: Not on file  . Years of education: Not on file  . Highest education level: Not on file  Social Needs  . Financial resource strain: Not on file  . Food insecurity - worry: Not on file  . Food insecurity - inability: Not on file  . Transportation needs - medical: Not on file  . Transportation needs - non-medical: Not on file  Occupational History  . Not on file  Tobacco Use  . Smoking status: Former Research scientist (life sciences)  . Smokeless tobacco: Never Used  Substance and Sexual Activity  . Alcohol use: No  . Drug use: No  . Sexual activity: Not on file  Other Topics Concern  . Not on file  Social History Narrative  . Not on file      Review of Systems  Constitutional: Positive for activity change and fatigue.  HENT: Positive for congestion, postnasal drip and rhinorrhea.   Respiratory: Positive for cough and wheezing.  Gastrointestinal: Positive for abdominal pain.  Genitourinary: Positive for difficulty urinating, dysuria, flank pain, frequency and urgency.  Musculoskeletal: Positive for back pain.  Skin: Negative.   Allergic/Immunologic: Negative for environmental allergies, food allergies and immunocompromised state.  Neurological: Positive for weakness and headaches.  Hematological: Negative.   Psychiatric/Behavioral: Negative for behavioral problems and sleep disturbance. The patient is not nervous/anxious.     Today's Vitals   05/27/17 1149  BP: (!) 173/72   Pulse: 72  Resp: 16  Temp: (!) 96.3 F (35.7 C)  TempSrc: Oral  SpO2: 95%  Weight: 244 lb 9.6 oz (110.9 kg)  Height: 5\' 8"  (1.727 m)    Physical Exam  Constitutional: She is oriented to person, place, and time. She appears well-developed and well-nourished.  HENT:  Head: Normocephalic and atraumatic.  Eyes: Pupils are equal, round, and reactive to light.  Neck: Normal range of motion. Neck supple. No thyromegaly present.  Cardiovascular: Normal rate and regular rhythm.  Pulmonary/Chest: Effort normal. She has wheezes.  Congested, non-productive cough present.  Abdominal: Soft. Bowel sounds are normal. There is no tenderness.  Genitourinary:  Genitourinary Comments: Urine sample positive for small WBC  Musculoskeletal: Normal range of motion.  Lymphadenopathy:    She has no cervical adenopathy.  Neurological: She is alert and oriented to person, place, and time.  Skin: Skin is warm and dry.  Psychiatric: She has a normal mood and affect.  Nursing note and vitals reviewed.   Assessment/Plan: 1. Acute cystitis with hematuria levofloxacin (LEVAQUIN) 500 MG tablet; Take 1 tablet (500 mg total) by mouth daily.  Dispense: 10 tablet; Refill: 0 Will send urine sample for culture and sensitivity and adjust antibiotics as indicated.  2. Vaginal candidiasis - fluconazole (DIFLUCAN) 150 MG tablet; Take 1 tablet po once. Repeat dose in 3 days for persistent symptoms.  Dispense: 5 tablet; Refill: 1 - nystatin ointment (MYCOSTATIN); Apply 1 application topically 3 (three) times daily.  Dispense: 60 g; Refill: 2  3. Acute upper respiratory infection Levofloxacin 500mg  daily for 10 days to fight infection.   4. Wheezing - albuterol (PROVENTIL HFA;VENTOLIN HFA) 108 (90 Base) MCG/ACT inhaler; Inhale 2 puffs into the lungs every 6 (six) hours as needed for wheezing or shortness of breath.  Dispense: 1 Inhaler; Refill: 1  5. Essential hypertension bp generally stable. Continue bp medication  as prescribed   6. Urinary tract infection without hematuria, site unspecified POCT Urinalysis Dipstick - CULTURE, URINE COMPREHENSIVE  General Counseling: Anaaya verbalizes understanding of the findings of todays visit and agrees with plan of treatment. I have discussed any further diagnostic evaluation that may be needed or ordered today. We also reviewed her medications today. she has been encouraged to call the office with any questions or concerns that should arise related to todays visit.  This patient was seen by Leretha Pol, FNP- C in Collaboration with Dr Lavera Guise as a part of collaborative care agreement    Time spent: 52 Minutes          Dr Lavera Guise Internal medicine

## 2017-05-30 LAB — CULTURE, URINE COMPREHENSIVE

## 2017-06-11 ENCOUNTER — Other Ambulatory Visit: Payer: Self-pay | Admitting: Nurse Practitioner

## 2017-06-11 DIAGNOSIS — B373 Candidiasis of vulva and vagina: Secondary | ICD-10-CM

## 2017-06-11 DIAGNOSIS — B3731 Acute candidiasis of vulva and vagina: Secondary | ICD-10-CM

## 2017-06-11 MED ORDER — CLOTRIMAZOLE 2 % VA CREA: 1.0000 | TOPICAL_CREAM | Freq: Every day | VAGINAL | 1 refills | Status: DC

## 2017-06-11 MED ORDER — NYSTATIN 100000 UNIT/GM EX OINT: 1.0000 "application " | TOPICAL_OINTMENT | Freq: Three times a day (TID) | CUTANEOUS | 2 refills | Status: DC

## 2017-06-11 NOTE — Progress Notes (Signed)
Start clotrimazole vaginal cream. Use 1 applicatorful nightly for 7 nights. Refilled nystatin cream to apply three times daily as long as needed. Both to [facility].

## 2017-06-15 ENCOUNTER — Other Ambulatory Visit: Payer: Self-pay

## 2017-06-15 ENCOUNTER — Encounter: Payer: Self-pay | Admitting: Internal Medicine

## 2017-06-15 DIAGNOSIS — M109 Gout, unspecified: Secondary | ICD-10-CM | POA: Insufficient documentation

## 2017-06-15 DIAGNOSIS — M792 Neuralgia and neuritis, unspecified: Secondary | ICD-10-CM | POA: Insufficient documentation

## 2017-06-15 DIAGNOSIS — E782 Mixed hyperlipidemia: Secondary | ICD-10-CM | POA: Insufficient documentation

## 2017-06-15 DIAGNOSIS — D518 Other vitamin B12 deficiency anemias: Secondary | ICD-10-CM | POA: Insufficient documentation

## 2017-06-15 DIAGNOSIS — M064 Inflammatory polyarthropathy: Secondary | ICD-10-CM | POA: Insufficient documentation

## 2017-06-15 DIAGNOSIS — F330 Major depressive disorder, recurrent, mild: Secondary | ICD-10-CM | POA: Insufficient documentation

## 2017-06-16 ENCOUNTER — Ambulatory Visit (INDEPENDENT_AMBULATORY_CARE_PROVIDER_SITE_OTHER): Payer: Medicare HMO | Admitting: Internal Medicine

## 2017-06-16 ENCOUNTER — Encounter: Payer: Self-pay | Admitting: Internal Medicine

## 2017-06-16 VITALS — BP 147/72 | HR 67 | Resp 16 | Ht 68.0 in | Wt 244.6 lb

## 2017-06-16 DIAGNOSIS — R300 Dysuria: Secondary | ICD-10-CM

## 2017-06-16 DIAGNOSIS — B373 Candidiasis of vulva and vagina: Secondary | ICD-10-CM

## 2017-06-16 DIAGNOSIS — N952 Postmenopausal atrophic vaginitis: Secondary | ICD-10-CM

## 2017-06-16 DIAGNOSIS — B3731 Acute candidiasis of vulva and vagina: Secondary | ICD-10-CM

## 2017-06-16 LAB — POCT URINALYSIS DIPSTICK
Bilirubin, UA: NEGATIVE
Blood, UA: NEGATIVE
Glucose, UA: NEGATIVE
Ketones, UA: NEGATIVE
Leukocytes, UA: NEGATIVE
Nitrite, UA: NEGATIVE
Protein, UA: NEGATIVE
Spec Grav, UA: 1.01 (ref 1.010–1.025)
Urobilinogen, UA: 0.2 E.U./dL
pH, UA: 5 (ref 5.0–8.0)

## 2017-06-16 MED ORDER — FLUCONAZOLE 150 MG PO TABS: ORAL_TABLET | ORAL | 1 refills | Status: DC

## 2017-06-16 MED ORDER — CLOTRIMAZOLE-BETAMETHASONE 1-0.05 % EX CREA: 1.0000 "application " | TOPICAL_CREAM | Freq: Two times a day (BID) | CUTANEOUS | 1 refills | Status: DC

## 2017-06-16 MED ORDER — ESTROGENS, CONJUGATED 0.625 MG/GM VA CREA: TOPICAL_CREAM | VAGINAL | 1 refills | Status: DC

## 2017-06-16 NOTE — Progress Notes (Signed)
[facility] [place], [place] [address]  Internal MEDICINE  Office Visit Note  Patient Name: Christy Carter  [id number]  [id number]  Date of Service: 06/16/2017  Chief Complaint  Patient presents with  . Vaginal Itching    better some with medication used but has been going on since november    HPI  Pt is here for routine follow up.  Rash and itching in perivaginal area Unable to sleep at night Dysuria is present Takes all medications as prescribed   Current Medication: Outpatient Encounter Medications as of 06/16/2017  Medication Sig  . albuterol (PROVENTIL HFA;VENTOLIN HFA) 108 (90 Base) MCG/ACT inhaler Inhale 2 puffs into the lungs every 6 (six) hours as needed for wheezing or shortness of breath.  . celecoxib (CELEBREX) 200 MG capsule Take 200 mg by mouth daily.  . clotrimazole (GYNE-LOTRIMIN 3) 2 % vaginal cream Place 1 Applicatorful vaginally at bedtime. Use for 7 nights  . ergocalciferol (VITAMIN D2) 50000 units capsule Take 50,000 Units by mouth once a week.  . fluconazole (DIFLUCAN) 150 MG tablet Take 1 tablet po once. Repeat dose in 3 days for persistent symptoms.  Marland Kitchen gabapentin (NEURONTIN) 100 MG capsule Take 100 mg by mouth 2 (two) times daily.  Marland Kitchen HYDROcodone-acetaminophen (NORCO/VICODIN) 5-325 MG tablet Take 1 tablet by mouth every 4 (four) hours as needed for moderate pain.  Marland Kitchen levofloxacin (LEVAQUIN) 500 MG tablet Take 1 tablet (500 mg total) by mouth daily.  Marland Kitchen levothyroxine (SYNTHROID, LEVOTHROID) 100 MCG tablet Take 100 mcg by mouth daily before breakfast.  . naproxen (NAPROSYN) 500 MG tablet Take 1 tablet (500 mg total) by mouth 2 (two) times daily with a meal.  . nystatin ointment (MYCOSTATIN) Apply 1 application topically 3 (three) times daily.  Marland Kitchen omeprazole (PRILOSEC) 20 MG capsule Take 20 mg by mouth daily.  . sertraline (ZOLOFT) 25 MG tablet Take 25 mg by mouth every evening.  . simvastatin (ZOCOR) 20 MG tablet Take 20 mg by mouth at  bedtime.  . traMADol (ULTRAM) 50 MG tablet Take 1 tablet (50 mg total) by mouth every 8 (eight) hours as needed.  . triamterene-hydrochlorothiazide (MAXZIDE-25) 37.5-25 MG per tablet Take 1 tablet by mouth daily.  . vitamin E 200 UNIT capsule Take 200 Units by mouth daily.  Marland Kitchen Zoster Vaccine Live, PF, (ZOSTAVAX) 75643 UNT/0.65ML injection Inject 0.65 mLs into the skin once.   No facility-administered encounter medications on file as of 06/16/2017.     Surgical History: Past Surgical History:  Procedure Laterality Date  . APPENDECTOMY    . BACK SURGERY    . CHOLECYSTECTOMY    . HERNIA REPAIR      Medical History: Past Medical History:  Diagnosis Date  . Arthritis   . Depression   . Hyperlipidemia   . Hypertension   . Shortness of breath   . Stroke ([place])   . Thyroid disease     Family History: History reviewed. No pertinent family history.  Social History   Socioeconomic History  . Marital status: Widowed    Spouse name: Not on file  . Number of children: Not on file  . Years of education: Not on file  . Highest education level: Not on file  Social Needs  . Financial resource strain: Not on file  . Food insecurity - worry: Not on file  . Food insecurity - inability: Not on file  . Transportation needs - medical: Not on file  . Transportation needs - non-medical: Not  on file  Occupational History  . Not on file  Tobacco Use  . Smoking status: Former Research scientist (life sciences)  . Smokeless tobacco: Never Used  Substance and Sexual Activity  . Alcohol use: No  . Drug use: No  . Sexual activity: Not on file  Other Topics Concern  . Not on file  Social History Narrative  . Not on file      Review of Systems  Constitutional: Negative for fatigue.  HENT: Negative for ear pain, postnasal drip and sinus pressure.   Eyes: Negative for photophobia, discharge, redness, itching and visual disturbance.  Respiratory: Negative for cough, shortness of breath and wheezing.   Cardiovascular:  Negative for chest pain, palpitations and leg swelling.  Gastrointestinal: Negative for abdominal pain, constipation, diarrhea, nausea and vomiting.  Genitourinary: Positive for dysuria and urgency. Negative for flank pain.  Musculoskeletal: Negative for arthralgias, back pain, gait problem and neck pain.  Skin: Positive for rash (itching). Negative for color change.  Allergic/Immunologic: Negative for environmental allergies and food allergies.  Neurological: Negative for dizziness and headaches.  Hematological: Does not bruise/bleed easily.  Psychiatric/Behavioral: Negative.  Negative for agitation, behavioral problems (depression) and hallucinations.    Vital Signs: BP (!) 147/72 (BP Location: Left Arm, Patient Position: Sitting)   Pulse 67   Resp 16   Ht 5\' 8"  (1.727 m)   Wt 244 lb 9.6 oz (110.9 kg)   SpO2 96%   BMI 37.19 kg/m    Physical Exam  Constitutional: She appears well-developed and well-nourished.  Eyes: EOM are normal. Pupils are equal, round, and reactive to light.  Cardiovascular: Normal rate.  Pulmonary/Chest: Effort normal.  Abdominal: Soft.  Genitourinary:    There is rash on the right labia. There is rash on the left labia. There is erythema in the vagina.    Assessment/Plan: 1. Dysuria - POCT Urinalysis Dipstick  2. Vaginal candidiasis - fluconazole (DIFLUCAN) 150 MG tablet; Take one tab a day for 3 days and then once a week  Dispense: 7 tablet; Refill: 1 - clotrimazole-betamethasone (LOTRISONE) cream; Apply 1 application topically 2 (two) times daily. Apply to the affected area only at night  Dispense: 30 g; Refill: 1  3. Atrophic vaginitis - conjugated estrogens (PREMARIN) vaginal cream; Use pea size and apply internaly in the vagina once a week  Dispense: 42.5 g; Refill: 1  General Counseling: [name] verbalizes understanding of the findings of todays visit and agrees with plan of treatment. I have discussed any further diagnostic evaluation that may  be needed or ordered today. We also reviewed her medications today. she has been encouraged to call the office with any questions or concerns that should arise related to todays visit.  Time spent: 75 Minutes          Dr Lavera Guise Internal medicine

## 2017-07-01 ENCOUNTER — Ambulatory Visit (INDEPENDENT_AMBULATORY_CARE_PROVIDER_SITE_OTHER): Payer: Medicare HMO | Admitting: Internal Medicine

## 2017-07-01 ENCOUNTER — Encounter: Payer: Self-pay | Admitting: Internal Medicine

## 2017-07-01 VITALS — BP 148/64 | HR 65 | Resp 16 | Ht 68.0 in | Wt 235.6 lb

## 2017-07-01 DIAGNOSIS — N390 Urinary tract infection, site not specified: Secondary | ICD-10-CM

## 2017-07-01 DIAGNOSIS — B373 Candidiasis of vulva and vagina: Secondary | ICD-10-CM | POA: Diagnosis not present

## 2017-07-01 DIAGNOSIS — N952 Postmenopausal atrophic vaginitis: Secondary | ICD-10-CM

## 2017-07-01 DIAGNOSIS — B3731 Acute candidiasis of vulva and vagina: Secondary | ICD-10-CM

## 2017-07-01 DIAGNOSIS — R300 Dysuria: Secondary | ICD-10-CM | POA: Diagnosis not present

## 2017-07-01 LAB — POCT URINALYSIS DIPSTICK
Bilirubin, UA: NEGATIVE
Blood, UA: NEGATIVE
Glucose, UA: NEGATIVE
Ketones, UA: NEGATIVE
Nitrite, UA: NEGATIVE
Protein, UA: NEGATIVE
Spec Grav, UA: <=1.005 — AB (ref 1.010–1.025)
Urobilinogen, UA: 0.2 E.U./dL
pH, UA: 7.5 (ref 5.0–8.0)

## 2017-07-01 MED ORDER — NYSTATIN 100000 UNIT/GM EX POWD
Freq: Two times a day (BID) | CUTANEOUS | 6 refills | Status: DC
Start: 2017-07-01 — End: 2018-02-10

## 2017-07-01 NOTE — Progress Notes (Signed)
[facility] [place], [place] [address]  Internal MEDICINE  Office Visit Note  Patient Name: Christy Carter  [id number]  [id number]  Date of Service: 07/19/2017  Chief Complaint  Patient presents with  . Rash  . Urinary Tract Infection    Rash  This is a recurrent problem. The current episode started more than 1 month ago. The problem has been gradually improving since onset. Location: perivaginal. The rash is characterized by burning (itching). Pertinent negatives include no congestion, cough, diarrhea, fatigue, rhinorrhea, shortness of breath, sore throat or vomiting.  Urinary Tract Infection   This is a recurrent problem. The current episode started 1 to 4 weeks ago. The problem occurs intermittently. The quality of the pain is described as burning. Associated symptoms include chills and flank pain. Pertinent negatives include no frequency, hematuria, nausea or vomiting. The treatment provided moderate relief.    Pt is here for f/u after treatment with premarin vaginal cream and Lotrisone. She is feeling better, UTI is resolved   Current Medication: Outpatient Encounter Medications as of 07/01/2017  Medication Sig  . albuterol (PROVENTIL HFA;VENTOLIN HFA) 108 (90 Base) MCG/ACT inhaler Inhale 2 puffs into the lungs every 6 (six) hours as needed for wheezing or shortness of breath.  . celecoxib (CELEBREX) 200 MG capsule Take 200 mg by mouth daily.  . clotrimazole-betamethasone (LOTRISONE) cream Apply 1 application topically 2 (two) times daily. Apply to the affected area only at night  . conjugated estrogens (PREMARIN) vaginal cream Use pea size and apply internaly in the vagina once a week  . ergocalciferol (VITAMIN D2) 50000 units capsule Take 50,000 Units by mouth once a week.  . fluconazole (DIFLUCAN) 150 MG tablet Take one tab a day for 3 days and then once a week  . gabapentin (NEURONTIN) 100 MG capsule Take 100 mg by mouth 2 (two) times daily.  .  naproxen (NAPROSYN) 500 MG tablet Take 1 tablet (500 mg total) by mouth 2 (two) times daily with a meal.  . omeprazole (PRILOSEC) 20 MG capsule Take 20 mg by mouth daily.  . simvastatin (ZOCOR) 20 MG tablet Take 20 mg by mouth at bedtime.  . triamterene-hydrochlorothiazide (MAXZIDE-25) 37.5-25 MG per tablet Take 1 tablet by mouth daily.  . vitamin E 200 UNIT capsule Take 200 Units by mouth daily.  Marland Kitchen HYDROcodone-acetaminophen (NORCO/VICODIN) 5-325 MG tablet Take 1 tablet by mouth every 4 (four) hours as needed for moderate pain. (Patient not taking: Reported on 07/01/2017)  . nystatin (MYCOSTATIN/NYSTOP) powder Apply topically 2 (two) times daily.  . sertraline (ZOLOFT) 25 MG tablet Take 25 mg by mouth every evening.  . traMADol (ULTRAM) 50 MG tablet Take 1 tablet (50 mg total) by mouth every 8 (eight) hours as needed. (Patient not taking: Reported on 07/01/2017)  . Zoster Vaccine Live, PF, (ZOSTAVAX) 67893 UNT/0.65ML injection Inject 0.65 mLs into the skin once.   No facility-administered encounter medications on file as of 07/01/2017.     Surgical History: Past Surgical History:  Procedure Laterality Date  . APPENDECTOMY    . BACK SURGERY    . CHOLECYSTECTOMY    . HERNIA REPAIR      Medical History: Past Medical History:  Diagnosis Date  . Arthritis   . Depression   . Hyperlipidemia   . Hypertension   . Shortness of breath   . Stroke ([place])   . Thyroid disease     Family History: Family History  Problem Relation Age of Onset  .  Heart disease Mother   . Hypertension Mother   . Diverticulitis Mother   . Heart disease Father   . Hypertension Father     Social History   Socioeconomic History  . Marital status: Widowed    Spouse name: Not on file  . Number of children: Not on file  . Years of education: Not on file  . Highest education level: Not on file  Social Needs  . Financial resource strain: Not on file  . Food insecurity - worry: Not on file  . Food insecurity -  inability: Not on file  . Transportation needs - medical: Not on file  . Transportation needs - non-medical: Not on file  Occupational History  . Not on file  Tobacco Use  . Smoking status: Former Smoker    Types: Cigarettes    Last attempt to quit: 08/15/1992    Years since quitting: 24.9  . Smokeless tobacco: Never Used  Substance and Sexual Activity  . Alcohol use: No  . Drug use: No  . Sexual activity: Not on file  Other Topics Concern  . Not on file  Social History Narrative  . Not on file      Review of Systems  Constitutional: Positive for chills. Negative for fatigue and unexpected weight change.  HENT: Positive for postnasal drip. Negative for congestion, rhinorrhea, sneezing and sore throat.   Eyes: Negative for redness.  Respiratory: Negative for cough, chest tightness and shortness of breath.   Cardiovascular: Negative for chest pain and palpitations.  Gastrointestinal: Negative for abdominal pain, constipation, diarrhea, nausea and vomiting.  Genitourinary: Positive for flank pain. Negative for dysuria, frequency and hematuria.  Musculoskeletal: Negative for arthralgias, back pain, joint swelling and neck pain.  Skin: Positive for rash.  Neurological: Negative.  Negative for tremors and numbness.  Hematological: Negative for adenopathy. Does not bruise/bleed easily.  Psychiatric/Behavioral: Negative for behavioral problems (Depression), sleep disturbance and suicidal ideas. The patient is not nervous/anxious.     Vital Signs: BP (!) 148/64   Pulse 65   Resp 16   Ht 5\' 8"  (1.727 m)   Wt 235 lb 9.6 oz (106.9 kg)   SpO2 95%   BMI 35.82 kg/m    Physical Exam  Constitutional: She is oriented to person, place, and time. She appears well-developed and well-nourished.  HENT:  Head: Normocephalic and atraumatic.  Mouth/Throat: Oropharynx is clear and moist. No oropharyngeal exudate.  Eyes: EOM are normal. Pupils are equal, round, and reactive to light.  Neck:  Normal range of motion. Neck supple.  Cardiovascular: Normal rate, regular rhythm and normal heart sounds. Exam reveals no friction rub.  Pulmonary/Chest: Effort normal. She has wheezes.  Abdominal: Soft. Bowel sounds are normal.  Musculoskeletal: Normal range of motion.  Neurological: She is alert and oriented to person, place, and time. No cranial nerve deficit.  Skin: Skin is warm and dry. There is erythema.  Psychiatric: She has a normal mood and affect. Her behavior is normal. Judgment and thought content normal.    Assessment/Plan: 1. Vaginal candidiasis - nystatin (MYCOSTATIN/NYSTOP) powder; Apply topically 2 (two) times daily.  Dispense: 15 g; Refill: 6  2. Urinary tract infection without hematuria, site unspecified - POCT Urinalysis Dipstick - CULTURE, URINE COMPREHENSIVE  3. Atrophic vaginitis - continue Premarin Vaginal cream once week   General Counseling: Christy Carter verbalizes understanding of the findings of todays visit and agrees with plan of treatment. I have discussed any further diagnostic evaluation that may be needed or ordered  today. We also reviewed her medications today. she has been encouraged to call the office with any questions or concerns that should arise related to todays visit.    Orders Placed This Encounter  Procedures  . CULTURE, URINE COMPREHENSIVE  . POCT Urinalysis Dipstick    Meds ordered this encounter  Medications  . nystatin (MYCOSTATIN/NYSTOP) powder    Sig: Apply topically 2 (two) times daily.    Dispense:  15 g    Refill:  6    Time spent [facility]

## 2017-07-04 LAB — CULTURE, URINE COMPREHENSIVE

## 2017-07-10 ENCOUNTER — Other Ambulatory Visit: Payer: Self-pay

## 2017-07-10 MED ORDER — METRONIDAZOLE 0.75 % EX GEL: 1.0000 "application " | Freq: Every evening | CUTANEOUS | 1 refills | Status: DC

## 2017-07-13 ENCOUNTER — Other Ambulatory Visit: Payer: Self-pay

## 2017-07-20 ENCOUNTER — Other Ambulatory Visit: Payer: Self-pay

## 2017-07-20 DIAGNOSIS — R062 Wheezing: Secondary | ICD-10-CM

## 2017-07-20 MED ORDER — ALBUTEROL SULFATE HFA 108 (90 BASE) MCG/ACT IN AERS: 2.0000 | INHALATION_SPRAY | Freq: Four times a day (QID) | RESPIRATORY_TRACT | 1 refills | Status: DC | PRN

## 2017-07-21 ENCOUNTER — Telehealth: Payer: Self-pay

## 2017-07-21 NOTE — Telephone Encounter (Signed)
-----   Message from Ronnell Freshwater, NP sent at 07/21/2017  8:01 AM EDT ----- Regarding: RE: pt still having burning Contact: [phone] The medications she was given were all presceribed 06/16/2017 and all have a refill on them. She just needs to call pharmacy to get them filled.  ----- Message ----- From: Edd Arbour, CMA Sent: 07/20/2017   1:38 PM To: Ronnell Freshwater, NP Subject: pt still having burning                        Pt left vm asking if she can get another rx for the medication that was given for the vaginal burning and itching.  She stated that it got better and then started up again.  dbs

## 2017-07-21 NOTE — Telephone Encounter (Signed)
Advised pt to call pharmacy and get refill on medication for her burning/itching.  dbs

## 2017-08-04 ENCOUNTER — Ambulatory Visit (INDEPENDENT_AMBULATORY_CARE_PROVIDER_SITE_OTHER): Payer: Medicare HMO | Admitting: Internal Medicine

## 2017-08-04 ENCOUNTER — Encounter: Payer: Self-pay | Admitting: Internal Medicine

## 2017-08-04 VITALS — BP 158/81 | HR 78 | Temp 94.2°F | Resp 16 | Ht 67.0 in | Wt 230.0 lb

## 2017-08-04 DIAGNOSIS — R319 Hematuria, unspecified: Secondary | ICD-10-CM

## 2017-08-04 DIAGNOSIS — N390 Urinary tract infection, site not specified: Secondary | ICD-10-CM | POA: Diagnosis not present

## 2017-08-04 DIAGNOSIS — B3731 Acute candidiasis of vulva and vagina: Secondary | ICD-10-CM

## 2017-08-04 DIAGNOSIS — B373 Candidiasis of vulva and vagina: Secondary | ICD-10-CM | POA: Diagnosis not present

## 2017-08-04 DIAGNOSIS — R102 Pelvic and perineal pain: Secondary | ICD-10-CM | POA: Diagnosis not present

## 2017-08-04 DIAGNOSIS — R300 Dysuria: Secondary | ICD-10-CM | POA: Diagnosis not present

## 2017-08-04 LAB — POCT URINALYSIS DIPSTICK
Glucose, UA: NEGATIVE
Ketones, UA: NEGATIVE
Nitrite, UA: NEGATIVE
Protein, UA: NEGATIVE
Spec Grav, UA: 1.01 (ref 1.010–1.025)
Urobilinogen, UA: 0.2 E.U./dL
pH, UA: 6 (ref 5.0–8.0)

## 2017-08-04 MED ORDER — FLUCONAZOLE 150 MG PO TABS: 150.0000 mg | ORAL_TABLET | Freq: Once | ORAL | 0 refills | Status: AC

## 2017-08-04 MED ORDER — FLUCONAZOLE 150 MG PO TABS: ORAL_TABLET | ORAL | 1 refills | Status: DC

## 2017-08-04 MED ORDER — CIPROFLOXACIN HCL 500 MG PO TABS: 500.0000 mg | ORAL_TABLET | Freq: Two times a day (BID) | ORAL | 1 refills | Status: DC

## 2017-08-04 NOTE — Progress Notes (Signed)
[facility] [place], [place] [address]  Internal MEDICINE  Office Visit Note  Patient Name: Christy Carter  [id number]  [id number]  Date of Service: 08/04/2017  Chief Complaint  Patient presents with  . Urinary Tract Infection    burning terrible when urinating   . Vaginal Itching    HPI  Pt is here for routine follow up. Pt continues to have this problem, she did improve after abx and premarin vaginal cream. She has noticed blood in her urine    Current Medication: Outpatient Encounter Medications as of 08/04/2017  Medication Sig  . albuterol (PROVENTIL HFA;VENTOLIN HFA) 108 (90 Base) MCG/ACT inhaler Inhale 2 puffs into the lungs every 6 (six) hours as needed for wheezing or shortness of breath.  . celecoxib (CELEBREX) 200 MG capsule Take 200 mg by mouth daily.  Marland Kitchen conjugated estrogens (PREMARIN) vaginal cream Use pea size and apply internaly in the vagina once a week  . ergocalciferol (VITAMIN D2) 50000 units capsule Take 50,000 Units by mouth once a week.  . metroNIDAZOLE (METROGEL) 0.75 % gel Apply 1 application topically every evening.  . naproxen (NAPROSYN) 500 MG tablet Take 1 tablet (500 mg total) by mouth 2 (two) times daily with a meal.  . nystatin (MYCOSTATIN/NYSTOP) powder Apply topically 2 (two) times daily.  Marland Kitchen omeprazole (PRILOSEC) 20 MG capsule Take 20 mg by mouth daily.  . simvastatin (ZOCOR) 20 MG tablet Take 20 mg by mouth at bedtime.  . triamterene-hydrochlorothiazide (MAXZIDE-25) 37.5-25 MG per tablet Take 1 tablet by mouth daily.  . ciprofloxacin (CIPRO) 500 MG tablet Take 1 tablet (500 mg total) by mouth 2 (two) times daily. After 10 days, take one tab po M/W/F  . clotrimazole-betamethasone (LOTRISONE) cream Apply 1 application topically 2 (two) times daily. Apply to the affected area only at night  . fluconazole (DIFLUCAN) 150 MG tablet Take one tab a day for 3 days and then once a week  . gabapentin (NEURONTIN) 100 MG capsule Take  100 mg by mouth 2 (two) times daily.  Marland Kitchen HYDROcodone-acetaminophen (NORCO/VICODIN) 5-325 MG tablet Take 1 tablet by mouth every 4 (four) hours as needed for moderate pain. (Patient not taking: Reported on 07/01/2017)  . sertraline (ZOLOFT) 25 MG tablet Take 25 mg by mouth every evening.  . traMADol (ULTRAM) 50 MG tablet Take 1 tablet (50 mg total) by mouth every 8 (eight) hours as needed. (Patient not taking: Reported on 07/01/2017)  . vitamin E 200 UNIT capsule Take 200 Units by mouth daily.  Marland Kitchen Zoster Vaccine Live, PF, (ZOSTAVAX) 71245 UNT/0.65ML injection Inject 0.65 mLs into the skin once.   No facility-administered encounter medications on file as of 08/04/2017.     Surgical History: Past Surgical History:  Procedure Laterality Date  . APPENDECTOMY    . BACK SURGERY    . CHOLECYSTECTOMY    . HERNIA REPAIR      Medical History: Past Medical History:  Diagnosis Date  . Arthritis   . Depression   . Hyperlipidemia   . Hypertension   . Shortness of breath   . Stroke ([place])   . Thyroid disease     Family History: Family History  Problem Relation Age of Onset  . Heart disease Mother   . Hypertension Mother   . Diverticulitis Mother   . Heart disease Father   . Hypertension Father     Social History   Socioeconomic History  . Marital status: Widowed    Spouse name: Not  on file  . Number of children: Not on file  . Years of education: Not on file  . Highest education level: Not on file  Occupational History  . Not on file  Social Needs  . Financial resource strain: Not on file  . Food insecurity:    Worry: Not on file    Inability: Not on file  . Transportation needs:    Medical: Not on file    Non-medical: Not on file  Tobacco Use  . Smoking status: Former Smoker    Types: Cigarettes    Last attempt to quit: 08/15/1992    Years since quitting: 24.9  . Smokeless tobacco: Never Used  Substance and Sexual Activity  . Alcohol use: No  . Drug use: No  . Sexual  activity: Not on file  Lifestyle  . Physical activity:    Days per week: Not on file    Minutes per session: Not on file  . Stress: Not on file  Relationships  . Social connections:    Talks on phone: Not on file    Gets together: Not on file    Attends religious service: Not on file    Active member of club or organization: Not on file    Attends meetings of clubs or organizations: Not on file    Relationship status: Not on file  . Intimate partner violence:    Fear of current or ex partner: Not on file    Emotionally abused: Not on file    Physically abused: Not on file    Forced sexual activity: Not on file  Other Topics Concern  . Not on file  Social History Narrative  . Not on file    Review of Systems  Gastrointestinal: Negative for abdominal pain, blood in stool and diarrhea.  Genitourinary: Positive for difficulty urinating, flank pain, hematuria and pelvic pain.    Vital Signs: BP (!) 158/81 (BP Location: Left Arm, Patient Position: Sitting, Cuff Size: Normal)   Pulse 78   Temp (!) 94.2 F (34.6 C) (Oral)   Resp 16   Ht 5\' 7"  (1.702 m)   Wt 230 lb (104.3 kg)   SpO2 96%   BMI 36.02 kg/m    Physical Exam  Constitutional: She appears well-developed and well-nourished. She appears distressed.  Neck: No JVD present. No tracheal deviation present. No thyromegaly present.  Pulmonary/Chest: Effort normal. She has no rales.  Lymphadenopathy:    She has no cervical adenopathy.  Skin: She is not diaphoretic.  Psychiatric: She has a normal mood and affect.   Assessment/Plan: 1. Urinary tract infection with hematuria, site unspecified - CULTURE, URINE COMPREHENSIVE - US PELVIS (TRANSABDOMINAL ONLY); Future - ciprofloxacin (CIPRO) 500 MG tablet; Take 1 tablet (500 mg total) by mouth 2 (two) times daily. After 10 days, take one tab po M/W/F  Dispense: 45 tablet; Refill: 1, Suppressive therapy  2. Dysuria - POCT Urinalysis Dipstick  3. Pelvic pain - US PELVIS  (TRANSABDOMINAL ONLY); Future  General Counseling: noni stonesifer understanding of the findings of todays visit and agrees with plan of treatment. I have discussed any further diagnostic evaluation that may be needed or ordered today. We also reviewed her medications today. she has been encouraged to call the office with any questions or concerns that should arise related to todays visit.   Orders Placed This Encounter  Procedures  . CULTURE, URINE COMPREHENSIVE  . US PELVIS (TRANSABDOMINAL ONLY)  . POCT Urinalysis Dipstick    Meds ordered  this encounter  Medications  . ciprofloxacin (CIPRO) 500 MG tablet    Sig: Take 1 tablet (500 mg total) by mouth 2 (two) times daily. After 10 days, take one tab po M/W/F    Dispense:  45 tablet    Refill:  1    Time spent:20 Minutes   Dr Lavera Guise Internal medicine

## 2017-08-07 LAB — CULTURE, URINE COMPREHENSIVE

## 2017-08-07 NOTE — Progress Notes (Signed)
Patient called and notified about infection and advised to continue taking rx.

## 2017-08-10 ENCOUNTER — Ambulatory Visit: Payer: Self-pay | Admitting: Nurse Practitioner

## 2017-08-17 ENCOUNTER — Ambulatory Visit: Payer: Medicare HMO | Admitting: Nurse Practitioner

## 2017-09-03 ENCOUNTER — Other Ambulatory Visit: Payer: Self-pay | Admitting: Nurse Practitioner

## 2017-09-03 MED ORDER — METRONIDAZOLE 0.75 % EX GEL: 1.0000 "application " | Freq: Every evening | CUTANEOUS | 1 refills | Status: DC

## 2017-09-08 ENCOUNTER — Other Ambulatory Visit: Payer: Self-pay | Admitting: Internal Medicine

## 2017-09-08 DIAGNOSIS — N952 Postmenopausal atrophic vaginitis: Secondary | ICD-10-CM

## 2017-09-22 ENCOUNTER — Ambulatory Visit
Admission: RE | Admit: 2017-09-22 | Discharge: 2017-09-22 | Disposition: A | Discharge status: Home / Self Care | Payer: Medicare HMO | Source: Ambulatory Visit | Attending: Internal Medicine | Admitting: Internal Medicine

## 2017-09-22 ENCOUNTER — Ambulatory Visit: Payer: Self-pay | Admitting: Nurse Practitioner

## 2017-09-22 DIAGNOSIS — R319 Hematuria, unspecified: Secondary | ICD-10-CM | POA: Insufficient documentation

## 2017-09-22 DIAGNOSIS — N83292 Other ovarian cyst, left side: Secondary | ICD-10-CM | POA: Insufficient documentation

## 2017-09-22 DIAGNOSIS — R9389 Abnormal findings on diagnostic imaging of other specified body structures: Secondary | ICD-10-CM | POA: Insufficient documentation

## 2017-09-22 DIAGNOSIS — N83202 Unspecified ovarian cyst, left side: Secondary | ICD-10-CM | POA: Diagnosis not present

## 2017-09-22 DIAGNOSIS — N390 Urinary tract infection, site not specified: Secondary | ICD-10-CM | POA: Insufficient documentation

## 2017-09-22 DIAGNOSIS — R102 Pelvic and perineal pain: Secondary | ICD-10-CM | POA: Diagnosis not present

## 2017-09-29 ENCOUNTER — Telehealth: Payer: Self-pay

## 2017-09-30 NOTE — Telephone Encounter (Signed)
Pt was notified that results will be discussed at next visit on 10/08/17 at 10:30

## 2017-09-30 NOTE — Telephone Encounter (Signed)
Will be discussed on next visit

## 2017-10-08 ENCOUNTER — Ambulatory Visit (INDEPENDENT_AMBULATORY_CARE_PROVIDER_SITE_OTHER): Payer: Medicare HMO | Admitting: Nurse Practitioner

## 2017-10-08 ENCOUNTER — Encounter: Payer: Self-pay | Admitting: Nurse Practitioner

## 2017-10-08 VITALS — BP 133/51 | HR 77 | Resp 16 | Ht 65.0 in | Wt 248.0 lb

## 2017-10-08 DIAGNOSIS — D390 Neoplasm of uncertain behavior of uterus: Secondary | ICD-10-CM

## 2017-10-08 DIAGNOSIS — M064 Inflammatory polyarthropathy: Secondary | ICD-10-CM

## 2017-10-08 DIAGNOSIS — R102 Pelvic and perineal pain: Secondary | ICD-10-CM | POA: Diagnosis not present

## 2017-10-08 MED ORDER — CELECOXIB 200 MG PO CAPS: 200.0000 mg | ORAL_CAPSULE | Freq: Every day | ORAL | 3 refills | Status: DC

## 2017-10-08 NOTE — Progress Notes (Signed)
[facility] [place], [place] [address]  Internal MEDICINE  Office Visit Note  Patient Name: Christy Carter  [id number]  [id number]  Date of Service: 10/08/2017  Chief Complaint  Patient presents with  . Follow-up    The patient has been having trouble, for last 6 months, with vaginal itching and discomfort. Has been treated multiple times for UTI and bacterial vaginitis. Did have single episode of vaginal bleeding. Recently had transvaginal ultrasound done. Did show endometrial hyperplasia as well as endometrial mass, possibly uterine fibroid embedded in endometrial lining.    Pt is here for routine follow up.    Current Medication: Outpatient Encounter Medications as of 10/08/2017  Medication Sig  . albuterol (PROVENTIL HFA;VENTOLIN HFA) 108 (90 Base) MCG/ACT inhaler Inhale 2 puffs into the lungs every 6 (six) hours as needed for wheezing or shortness of breath.  . celecoxib (CELEBREX) 200 MG capsule Take 1 capsule (200 mg total) by mouth daily.  . clotrimazole-betamethasone (LOTRISONE) cream Apply 1 application topically 2 (two) times daily. Apply to the affected area only at night  . conjugated estrogens (PREMARIN) vaginal cream USE PEA SIZE AND APPLY INTERNALY IN THE VAGINA ONCE A WEEK  . ergocalciferol (VITAMIN D2) 50000 units capsule Take 50,000 Units by mouth once a week.  . fluconazole (DIFLUCAN) 150 MG tablet Take one tab a day for 3 days and then once a week (Patient not taking: Reported on 10/08/2017)  . gabapentin (NEURONTIN) 100 MG capsule Take 100 mg by mouth 2 (two) times daily.  Marland Kitchen HYDROcodone-acetaminophen (NORCO/VICODIN) 5-325 MG tablet Take 1 tablet by mouth every 4 (four) hours as needed for moderate pain. (Patient not taking: Reported on 07/01/2017)  . levothyroxine (SYNTHROID, LEVOTHROID) 100 MCG tablet TAKE 1 TABLET EVERY DAY  . metroNIDAZOLE (METROGEL) 0.75 % gel Apply 1 application topically every evening.  . naproxen (NAPROSYN) 500 MG  tablet Take 1 tablet (500 mg total) by mouth 2 (two) times daily with a meal.  . nystatin (MYCOSTATIN/NYSTOP) powder Apply topically 2 (two) times daily.  Marland Kitchen omeprazole (PRILOSEC) 20 MG capsule Take 20 mg by mouth daily.  . sertraline (ZOLOFT) 25 MG tablet Take 25 mg by mouth every evening.  . simvastatin (ZOCOR) 20 MG tablet TAKE 1 TABLET AT BEDTIME  . traMADol (ULTRAM) 50 MG tablet Take 1 tablet (50 mg total) by mouth every 8 (eight) hours as needed. (Patient not taking: Reported on 07/01/2017)  . triamterene-hydrochlorothiazide (MAXZIDE-25) 37.5-25 MG per tablet Take 1 tablet by mouth daily.  . vitamin E 200 UNIT capsule Take 200 Units by mouth daily.  Marland Kitchen Zoster Vaccine Live, PF, (ZOSTAVAX) 37902 UNT/0.65ML injection Inject 0.65 mLs into the skin once.  . [DISCONTINUED] celecoxib (CELEBREX) 200 MG capsule Take 200 mg by mouth daily.  . [DISCONTINUED] ciprofloxacin (CIPRO) 500 MG tablet Take 1 tablet (500 mg total) by mouth 2 (two) times daily. After 10 days, take one tab po M/W/F (Patient not taking: Reported on 10/08/2017)   No facility-administered encounter medications on file as of 10/08/2017.     Surgical History: Past Surgical History:  Procedure Laterality Date  . APPENDECTOMY    . BACK SURGERY    . CHOLECYSTECTOMY    . HERNIA REPAIR      Medical History: Past Medical History:  Diagnosis Date  . Arthritis   . Depression   . Hyperlipidemia   . Hypertension   . Shortness of breath   . Stroke ([place])   . Thyroid disease  Family History: Family History  Problem Relation Age of Onset  . Heart disease Mother   . Hypertension Mother   . Diverticulitis Mother   . Heart disease Father   . Hypertension Father     Social History   Socioeconomic History  . Marital status: Widowed    Spouse name: Not on file  . Number of children: Not on file  . Years of education: Not on file  . Highest education level: Not on file  Occupational History  . Not on file  Social Needs   . Financial resource strain: Not on file  . Food insecurity:    Worry: Not on file    Inability: Not on file  . Transportation needs:    Medical: Not on file    Non-medical: Not on file  Tobacco Use  . Smoking status: Former Smoker    Types: Cigarettes    Last attempt to quit: 08/15/1992    Years since quitting: 25.1  . Smokeless tobacco: Never Used  Substance and Sexual Activity  . Alcohol use: No  . Drug use: No  . Sexual activity: Not on file  Lifestyle  . Physical activity:    Days per week: Not on file    Minutes per session: Not on file  . Stress: Not on file  Relationships  . Social connections:    Talks on phone: Not on file    Gets together: Not on file    Attends religious service: Not on file    Active member of club or organization: Not on file    Attends meetings of clubs or organizations: Not on file    Relationship status: Not on file  . Intimate partner violence:    Fear of current or ex partner: Not on file    Emotionally abused: Not on file    Physically abused: Not on file    Forced sexual activity: Not on file  Other Topics Concern  . Not on file  Social History Narrative  . Not on file      Review of Systems  Constitutional: Positive for fatigue. Negative for chills and unexpected weight change.  HENT: Negative for congestion, postnasal drip, rhinorrhea, sneezing and sore throat.   Eyes: Negative.  Negative for redness.  Respiratory: Negative for cough, chest tightness, shortness of breath and wheezing.   Cardiovascular: Negative for chest pain and palpitations.  Gastrointestinal: Negative for abdominal pain, constipation, diarrhea and nausea.  Endocrine: Negative for cold intolerance, polydipsia, polyphagia and polyuria.  Genitourinary: Positive for difficulty urinating, flank pain and vaginal pain. Negative for dysuria, frequency and hematuria.  Musculoskeletal: Positive for back pain. Negative for arthralgias, joint swelling and neck pain.   Skin: Positive for rash.  Allergic/Immunologic: Negative for environmental allergies.  Neurological: Positive for weakness. Negative for dizziness, tremors, numbness and headaches.  Hematological: Negative for adenopathy. Does not bruise/bleed easily.  Psychiatric/Behavioral: Negative for behavioral problems (Depression), sleep disturbance and suicidal ideas. The patient is not nervous/anxious.     Today's Vitals   10/08/17 1031  BP: (!) 133/51  Pulse: 77  Resp: 16  SpO2: 95%  Weight: 248 lb (112.5 kg)  Height: 5\' 5"  (1.651 m)    Physical Exam  Constitutional: She is oriented to person, place, and time. She appears well-developed and well-nourished. No distress.  HENT:  Head: Normocephalic and atraumatic.  Nose: Nose normal.  Mouth/Throat: Oropharynx is clear and moist. No oropharyngeal exudate.  Eyes: Pupils are equal, round, and reactive to light.  Conjunctivae and EOM are normal.  Neck: Normal range of motion. Neck supple. No JVD present. No tracheal deviation present. No thyromegaly present.  Cardiovascular: Normal rate, regular rhythm and normal heart sounds. Exam reveals no gallop and no friction rub.  No murmur heard. Pulmonary/Chest: Effort normal and breath sounds normal. No respiratory distress. She has no wheezes. She has no rales. She exhibits no tenderness.  Abdominal: Soft. Bowel sounds are normal.  Musculoskeletal: Normal range of motion.  Lymphadenopathy:    She has no cervical adenopathy.  Neurological: She is alert and oriented to person, place, and time. No cranial nerve deficit.  Skin: Skin is warm and dry. She is not diaphoretic.  Psychiatric: She has a normal mood and affect. Her behavior is normal. Judgment and thought content normal.  Nursing note and vitals reviewed.  Assessment/Plan: 1. Neoplasm of uncertain behavior of endometrium Reviewed transvaginal u/s with patient. Did show endometrial hyperplasia and possible uterine fibroid. Refer to GYN for  further evaluation.  - Ambulatory referral to Gynecology  2. Pelvic pain Refer to GYN   3. Inflammatory polyarthropathy (HCC) Continue celebrex 200mg  daily as needed. Refill provided today.  - celecoxib (CELEBREX) 200 MG capsule; Take 1 capsule (200 mg total) by mouth daily.  Dispense: 30 capsule; Refill: 3  General Counseling: Casilda verbalizes understanding of the findings of todays visit and agrees with plan of treatment. I have discussed any further diagnostic evaluation that may be needed or ordered today. We also reviewed her medications today. she has been encouraged to call the office with any questions or concerns that should arise related to todays visit.  This patient was seen by Leretha Pol, FNP- C in Collaboration with Dr Lavera Guise as a part of collaborative care agreement    Orders Placed This Encounter  Procedures  . Ambulatory referral to Gynecology    Meds ordered this encounter  Medications  . celecoxib (CELEBREX) 200 MG capsule    Sig: Take 1 capsule (200 mg total) by mouth daily.    Dispense:  30 capsule    Refill:  3    Order Specific Question:   Supervising Provider    Answer:   Lavera Guise [[id number]]    Time spent: 83 Minutes    Dr Lavera Guise Internal medicine

## 2017-10-22 DIAGNOSIS — B3749 Other urogenital candidiasis: Secondary | ICD-10-CM | POA: Diagnosis not present

## 2017-10-26 ENCOUNTER — Other Ambulatory Visit: Payer: Self-pay

## 2017-10-26 MED ORDER — METRONIDAZOLE 0.75 % EX GEL: 1.0000 "application " | Freq: Every evening | CUTANEOUS | 1 refills | Status: DC

## 2017-10-27 ENCOUNTER — Other Ambulatory Visit: Payer: Self-pay

## 2017-10-27 MED ORDER — METRONIDAZOLE 0.75 % EX GEL: 1.0000 "application " | Freq: Every evening | CUTANEOUS | 1 refills | Status: DC

## 2017-11-11 ENCOUNTER — Encounter: Payer: Self-pay | Admitting: Obstetrics and Gynecology

## 2017-12-11 ENCOUNTER — Other Ambulatory Visit: Payer: Self-pay | Admitting: Internal Medicine

## 2017-12-15 ENCOUNTER — Encounter: Payer: Self-pay | Admitting: Adult Health

## 2017-12-15 ENCOUNTER — Ambulatory Visit: Payer: Medicare HMO | Admitting: Adult Health

## 2017-12-15 ENCOUNTER — Other Ambulatory Visit: Payer: Self-pay | Admitting: Adult Health

## 2017-12-15 VITALS — BP 150/80 | HR 82 | Resp 16 | Ht 67.0 in | Wt 244.0 lb

## 2017-12-15 DIAGNOSIS — E0781 Sick-euthyroid syndrome: Secondary | ICD-10-CM | POA: Diagnosis not present

## 2017-12-15 DIAGNOSIS — R5383 Other fatigue: Secondary | ICD-10-CM | POA: Diagnosis not present

## 2017-12-15 DIAGNOSIS — E079 Disorder of thyroid, unspecified: Secondary | ICD-10-CM | POA: Diagnosis not present

## 2017-12-15 DIAGNOSIS — E538 Deficiency of other specified B group vitamins: Secondary | ICD-10-CM | POA: Diagnosis not present

## 2017-12-15 DIAGNOSIS — R0602 Shortness of breath: Secondary | ICD-10-CM

## 2017-12-15 DIAGNOSIS — I10 Essential (primary) hypertension: Secondary | ICD-10-CM

## 2017-12-15 DIAGNOSIS — R29898 Other symptoms and signs involving the musculoskeletal system: Secondary | ICD-10-CM | POA: Diagnosis not present

## 2017-12-15 DIAGNOSIS — E611 Iron deficiency: Secondary | ICD-10-CM | POA: Diagnosis not present

## 2017-12-15 DIAGNOSIS — R5381 Other malaise: Secondary | ICD-10-CM | POA: Diagnosis not present

## 2017-12-15 NOTE — Progress Notes (Signed)
[facility] [place], [place] [address]  Internal MEDICINE  Office Visit Note  Patient Name: MARCUS GROLL  [id number]  [id number]  Date of Service: 12/15/2017  Chief Complaint  Patient presents with  . Shortness of Breath    going on for few weeks   . Fatigue     Shortness of Breath  This is a recurrent problem. Pertinent negatives include no abdominal pain, chest pain, neck pain, rash, rhinorrhea, sore throat or vomiting.   Pt is here for a sick visit.  She reports for a few days she has been feeling weak and fatigued.  She reports one episode of diarrhea that resolved. She is very frustrated because she feels like her legs are going to give out, when she walks.  She is currently using a Rolator walker. She was unable to complete a six minute walk because of her legs.  She reports the last time she felt this way was when she was diagnosed with hypothyroid.      Current Medication:  Outpatient Encounter Medications as of 12/15/2017  Medication Sig  . albuterol (PROVENTIL HFA;VENTOLIN HFA) 108 (90 Base) MCG/ACT inhaler Inhale 2 puffs into the lungs every 6 (six) hours as needed for wheezing or shortness of breath.  . celecoxib (CELEBREX) 200 MG capsule Take 1 capsule (200 mg total) by mouth daily.  . clotrimazole-betamethasone (LOTRISONE) cream Apply 1 application topically 2 (two) times daily. Apply to the affected area only at night  . conjugated estrogens (PREMARIN) vaginal cream USE PEA SIZE AND APPLY INTERNALY IN THE VAGINA ONCE A WEEK  . fluconazole (DIFLUCAN) 150 MG tablet Take one tab a day for 3 days and then once a week  . gabapentin (NEURONTIN) 100 MG capsule Take 100 mg by mouth 2 (two) times daily.  Marland Kitchen HYDROcodone-acetaminophen (NORCO/VICODIN) 5-325 MG tablet Take 1 tablet by mouth every 4 (four) hours as needed for moderate pain.  Marland Kitchen levothyroxine (SYNTHROID, LEVOTHROID) 100 MCG tablet TAKE 1 TABLET EVERY DAY  . metroNIDAZOLE (METROGEL) 0.75 %  gel Apply 1 application topically every evening.  . naproxen (NAPROSYN) 500 MG tablet Take 1 tablet (500 mg total) by mouth 2 (two) times daily with a meal.  . nystatin (MYCOSTATIN/NYSTOP) powder Apply topically 2 (two) times daily.  Marland Kitchen omeprazole (PRILOSEC) 20 MG capsule Take 20 mg by mouth daily.  . sertraline (ZOLOFT) 25 MG tablet Take 25 mg by mouth every evening.  . simvastatin (ZOCOR) 20 MG tablet TAKE 1 TABLET AT BEDTIME  . traMADol (ULTRAM) 50 MG tablet Take 1 tablet (50 mg total) by mouth every 8 (eight) hours as needed.  . triamterene-hydrochlorothiazide (MAXZIDE-25) 37.5-25 MG per tablet Take 1 tablet by mouth daily.  . Vitamin D, Ergocalciferol, (DRISDOL) 50000 units CAPS capsule TAKE ONE CAPSULE BY MOUTH WEEKLY FOR 12 WEEKS  . vitamin E 200 UNIT capsule Take 200 Units by mouth daily.  Marland Kitchen Zoster Vaccine Live, PF, (ZOSTAVAX) 88416 UNT/0.65ML injection Inject 0.65 mLs into the skin once.   No facility-administered encounter medications on file as of 12/15/2017.       Medical History: Past Medical History:  Diagnosis Date  . Arthritis   . Depression   . Hyperlipidemia   . Hypertension   . Shortness of breath   . Stroke ([place])   . Thyroid disease      Vital Signs: BP (!) 150/80   Pulse 82   Resp 16   Ht 5\' 7"  (1.702 m)   Wt 244  lb (110.7 kg)   SpO2 95%   BMI 38.22 kg/m    Review of Systems  Constitutional: Negative for chills, fatigue and unexpected weight change.  HENT: Negative for congestion, rhinorrhea, sneezing and sore throat.   Eyes: Negative for photophobia, pain and redness.  Respiratory: Positive for shortness of breath. Negative for cough and chest tightness.   Cardiovascular: Negative for chest pain and palpitations.  Gastrointestinal: Negative for abdominal pain, constipation, diarrhea, nausea and vomiting.  Endocrine: Negative.   Genitourinary: Negative for dysuria and frequency.  Musculoskeletal: Negative for arthralgias, back pain, joint swelling  and neck pain.  Skin: Negative for rash.  Allergic/Immunologic: Negative.   Neurological: Negative for tremors and numbness.  Hematological: Negative for adenopathy. Does not bruise/bleed easily.  Psychiatric/Behavioral: Negative for behavioral problems and sleep disturbance. The patient is not nervous/anxious.     Physical Exam  Constitutional: She is oriented to person, place, and time. She appears well-developed and well-nourished. No distress.  HENT:  Head: Normocephalic and atraumatic.  Mouth/Throat: Oropharynx is clear and moist. No oropharyngeal exudate.  Eyes: Pupils are equal, round, and reactive to light. EOM are normal.  Neck: Normal range of motion. Neck supple. No JVD present. No tracheal deviation present. No thyromegaly present.  Cardiovascular: Normal rate, regular rhythm and normal heart sounds. Exam reveals no gallop and no friction rub.  No murmur heard. Pulmonary/Chest: Effort normal and breath sounds normal. No respiratory distress. She has no wheezes. She has no rales. She exhibits no tenderness.  Abdominal: Soft. There is no tenderness. There is no guarding.  Musculoskeletal: Normal range of motion.  Lymphadenopathy:    She has no cervical adenopathy.  Neurological: She is alert and oriented to person, place, and time. No cranial nerve deficit.  Skin: Skin is warm and dry. She is not diaphoretic.  Psychiatric: She has a normal mood and affect. Her behavior is normal. Judgment and thought content normal.  Nursing note and vitals reviewed.   Assessment/Plan: 1. Bilateral leg weakness New weakness of legs. Will draw fatigue labs, and consider physical therapy depending on results.   2. Fatigue, unspecified type - CBC with Differential/Platelet - TSH - T4, free - Comprehensive metabolic panel - Vitamin D (25 hydroxy) - Fe+TIBC+Fer - B12 and Folate Panel  3. SOB (shortness of breath) Sat did not drop when walking.  Pt did appear short of breath. If not for  her leg weakness, she could have kept walking. - 6 minute walk  4. Essential hypertension Stable at this time.  5. Morbid obesity ([place]) Obesity Counseling: Risk Assessment: An assessment of behavioral risk factors was made today and includes lack of exercise sedentary lifestyle, lack of portion control and poor dietary habits.  Risk Modification Advice: She was counseled on portion control guidelines. Restricting daily caloric intake to. . The detrimental long term effects of obesity on her health and ongoing poor compliance was also discussed with the patient.    General Counseling: true [place] understanding of the findings of todays visit and agrees with plan of treatment. I have discussed any further diagnostic evaluation that may be needed or ordered today. We also reviewed her medications today. she has been encouraged to call the office with any questions or concerns that should arise related to todays visit.   No orders of the defined types were placed in this encounter.   No orders of the defined types were placed in this encounter.   Time spent:25 Minutes  This patient was seen by [place] Skye  Jesseka Drinkard AGNP-C in Collaboration with Dr Lavera Guise as a part of collaborative care agreement

## 2017-12-15 NOTE — Patient Instructions (Signed)

## 2017-12-16 LAB — COMPREHENSIVE METABOLIC PANEL
ALT: 8 IU/L (ref 0–32)
AST: 14 IU/L (ref 0–40)
Albumin/Globulin Ratio: 1.9 (ref 1.2–2.2)
Albumin: 4.3 g/dL (ref 3.5–4.7)
Alkaline Phosphatase: 109 IU/L (ref 39–117)
BUN/Creatinine Ratio: 19 (ref 12–28)
BUN: 21 mg/dL (ref 8–27)
Bilirubin Total: 0.4 mg/dL (ref 0.0–1.2)
CO2: 27 mmol/L (ref 20–29)
Calcium: 9.2 mg/dL (ref 8.7–10.3)
Chloride: 102 mmol/L (ref 96–106)
Creatinine, Ser: 1.09 mg/dL — ABNORMAL HIGH (ref 0.57–1.00)
GFR calc Af Amer: 55 mL/min/{1.73_m2} — ABNORMAL LOW (ref >59)
GFR calc non Af Amer: 47 mL/min/{1.73_m2} — ABNORMAL LOW (ref >59)
Globulin, Total: 2.3 g/dL (ref 1.5–4.5)
Glucose: 87 mg/dL (ref 65–99)
Potassium: 4.5 mmol/L (ref 3.5–5.2)
Sodium: 142 mmol/L (ref 134–144)
Total Protein: 6.6 g/dL (ref 6.0–8.5)

## 2017-12-16 LAB — CBC WITH DIFFERENTIAL/PLATELET
Basophils Absolute: 0.1 10*3/uL (ref 0.0–0.2)
Basos: 1 %
EOS (ABSOLUTE): 0.3 10*3/uL (ref 0.0–0.4)
Eos: 4 %
Hematocrit: 43.4 % (ref 34.0–46.6)
Hemoglobin: 15.2 g/dL (ref 11.1–15.9)
Immature Grans (Abs): 0 10*3/uL (ref 0.0–0.1)
Immature Granulocytes: 1 %
Lymphocytes Absolute: 1.9 10*3/uL (ref 0.7–3.1)
Lymphs: 32 %
MCH: 31 pg (ref 26.6–33.0)
MCHC: 35 g/dL (ref 31.5–35.7)
MCV: 88 fL (ref 79–97)
Monocytes Absolute: 0.6 10*3/uL (ref 0.1–0.9)
Monocytes: 10 %
Neutrophils Absolute: 3 10*3/uL (ref 1.4–7.0)
Neutrophils: 52 %
Platelets: 254 10*3/uL (ref 150–450)
RBC: 4.91 x10E6/uL (ref 3.77–5.28)
RDW: 13.9 % (ref 12.3–15.4)
WBC: 5.8 10*3/uL (ref 3.4–10.8)

## 2017-12-16 LAB — IRON,TIBC AND FERRITIN PANEL
Ferritin: 70 ng/mL (ref 15–150)
Iron Saturation: 18 % (ref 15–55)
Iron: 50 ug/dL (ref 27–139)
Total Iron Binding Capacity: 285 ug/dL (ref 250–450)
UIBC: 235 ug/dL (ref 118–369)

## 2017-12-16 LAB — VITAMIN D 25 HYDROXY (VIT D DEFICIENCY, FRACTURES): Vit D, 25-Hydroxy: 27.8 ng/mL — ABNORMAL LOW (ref 30.0–100.0)

## 2017-12-16 LAB — B12 AND FOLATE PANEL
Folate: 15.2 ng/mL (ref >3.0)
Vitamin B-12: >2000 pg/mL — ABNORMAL HIGH (ref 232–1245)

## 2017-12-16 LAB — T4, FREE: Free T4: 1.32 ng/dL (ref 0.82–1.77)

## 2017-12-16 LAB — TSH: TSH: 1.71 u[IU]/mL (ref 0.450–4.500)

## 2017-12-17 ENCOUNTER — Telehealth: Payer: Self-pay | Admitting: Nurse Practitioner

## 2017-12-17 ENCOUNTER — Encounter: Payer: Self-pay | Admitting: Adult Health

## 2017-12-17 NOTE — Telephone Encounter (Signed)
Talked to Christy Carter about her b12 , patient is to stop taken supplement until seen at next appointment

## 2018-01-08 ENCOUNTER — Encounter: Payer: Self-pay | Admitting: Nurse Practitioner

## 2018-01-08 ENCOUNTER — Other Ambulatory Visit: Payer: Self-pay | Admitting: Nurse Practitioner

## 2018-01-08 ENCOUNTER — Ambulatory Visit (INDEPENDENT_AMBULATORY_CARE_PROVIDER_SITE_OTHER): Payer: Medicare HMO | Admitting: Nurse Practitioner

## 2018-01-08 VITALS — BP 156/68 | HR 68 | Resp 16 | Ht 67.0 in | Wt 235.0 lb

## 2018-01-08 DIAGNOSIS — M064 Inflammatory polyarthropathy: Secondary | ICD-10-CM | POA: Diagnosis not present

## 2018-01-08 DIAGNOSIS — G8929 Other chronic pain: Secondary | ICD-10-CM | POA: Diagnosis not present

## 2018-01-08 DIAGNOSIS — M545 Low back pain: Secondary | ICD-10-CM | POA: Diagnosis not present

## 2018-01-08 DIAGNOSIS — I10 Essential (primary) hypertension: Secondary | ICD-10-CM

## 2018-01-08 DIAGNOSIS — Z0001 Encounter for general adult medical examination with abnormal findings: Secondary | ICD-10-CM

## 2018-01-08 DIAGNOSIS — Z23 Encounter for immunization: Secondary | ICD-10-CM | POA: Diagnosis not present

## 2018-01-08 DIAGNOSIS — R300 Dysuria: Secondary | ICD-10-CM

## 2018-01-08 MED ORDER — ZOSTER VAC RECOMB ADJUVANTED 50 MCG/0.5ML IM SUSR: INTRAMUSCULAR | 1 refills | Status: DC

## 2018-01-08 MED ORDER — HYDROCODONE-ACETAMINOPHEN 5-325 MG PO TABS: 1.0000 | ORAL_TABLET | ORAL | 0 refills | Status: DC | PRN

## 2018-01-08 NOTE — Progress Notes (Signed)
[facility] [place], [place] [address]  Internal MEDICINE  Office Visit Note  Patient Name: Christy Carter  [id number]  [id number]  Date of Service: 01/13/2018   Pt is here for routine health maintenance examination  Chief Complaint  Patient presents with  . Annual Exam    3 month wellness visit  . Hypertension  . Back Pain     The patient c/o lower back pain. This is intermittent. Makes it difficult for her to stand up from seated position and to walk without difficulty. Generally, she takes celebrex every day which will help keep pain and inflammation manageable. Intermittently, the pain can be so bad, she will take a hydrocodone/APPAP. This does make her sleepy, but she only takes it if she is able to go to bed and rest. A prescription for #20 tablets will last her 4-6 months.   Hypertension  This is a chronic problem. The current episode started more than 1 year ago. The problem is unchanged. The problem is resistant. Associated symptoms include shortness of breath. Pertinent negatives include no chest pain, headaches, neck pain or palpitations. Agents associated with hypertension include NSAIDs and thyroid hormones. Risk factors for coronary artery disease include obesity, post-menopausal state and dyslipidemia. Past treatments include diuretics. The current treatment provides moderate improvement. Compliance problems include exercise.  Hypertensive end-organ damage includes kidney disease.     Current Medication: Outpatient Encounter Medications as of 01/08/2018  Medication Sig  . albuterol (PROVENTIL HFA;VENTOLIN HFA) 108 (90 Base) MCG/ACT inhaler Inhale 2 puffs into the lungs every 6 (six) hours as needed for wheezing or shortness of breath.  . celecoxib (CELEBREX) 200 MG capsule Take 1 capsule (200 mg total) by mouth daily.  Marland Kitchen levothyroxine (SYNTHROID, LEVOTHROID) 100 MCG tablet TAKE 1 TABLET EVERY DAY  . metroNIDAZOLE (METROGEL) 0.75 % gel Apply 1  application topically every evening.  Marland Kitchen omeprazole (PRILOSEC) 20 MG capsule Take 20 mg by mouth daily.  . simvastatin (ZOCOR) 20 MG tablet TAKE 1 TABLET AT BEDTIME  . traMADol (ULTRAM) 50 MG tablet Take 1 tablet (50 mg total) by mouth every 8 (eight) hours as needed.  . triamterene-hydrochlorothiazide (MAXZIDE-25) 37.5-25 MG per tablet Take 1 tablet by mouth daily.  . Vitamin D, Ergocalciferol, (DRISDOL) 50000 units CAPS capsule TAKE ONE CAPSULE BY MOUTH WEEKLY FOR 12 WEEKS  . vitamin E 200 UNIT capsule Take 200 Units by mouth daily.  . clotrimazole-betamethasone (LOTRISONE) cream Apply 1 application topically 2 (two) times daily. Apply to the affected area only at night (Patient not taking: Reported on 01/08/2018)  . conjugated estrogens (PREMARIN) vaginal cream USE PEA SIZE AND APPLY INTERNALY IN THE VAGINA ONCE A WEEK (Patient not taking: Reported on 01/08/2018)  . fluconazole (DIFLUCAN) 150 MG tablet Take one tab a day for 3 days and then once a week (Patient not taking: Reported on 01/08/2018)  . gabapentin (NEURONTIN) 100 MG capsule Take 100 mg by mouth 2 (two) times daily.  Marland Kitchen HYDROcodone-acetaminophen (NORCO/VICODIN) 5-325 MG tablet Take 1 tablet by mouth every 4 (four) hours as needed for moderate pain.  . naproxen (NAPROSYN) 500 MG tablet Take 1 tablet (500 mg total) by mouth 2 (two) times daily with a meal. (Patient not taking: Reported on 01/08/2018)  . nystatin (MYCOSTATIN/NYSTOP) powder Apply topically 2 (two) times daily. (Patient not taking: Reported on 01/08/2018)  . sertraline (ZOLOFT) 25 MG tablet Take 25 mg by mouth every evening.  . vitamin B-12 (CYANOCOBALAMIN) 500 MCG tablet Take 500  mcg by mouth daily.  Marland Kitchen Zoster Vaccine Adjuvanted [facility]) injection Shingles vaccine series - inject IM as directed .  Marland Kitchen Zoster Vaccine Live, PF, (ZOSTAVAX) 82993 UNT/0.65ML injection Inject 0.65 mLs into the skin once.  . [DISCONTINUED] HYDROcodone-acetaminophen (NORCO/VICODIN) 5-325 MG tablet Take  1 tablet by mouth every 4 (four) hours as needed for moderate pain. (Patient not taking: Reported on 01/08/2018)   No facility-administered encounter medications on file as of 01/08/2018.     Surgical History: Past Surgical History:  Procedure Laterality Date  . APPENDECTOMY    . BACK SURGERY    . CHOLECYSTECTOMY    . HERNIA REPAIR      Medical History: Past Medical History:  Diagnosis Date  . Arthritis   . Depression   . Hyperlipidemia   . Hypertension   . Shortness of breath   . Stroke ([place])   . Thyroid disease     Family History: Family History  Problem Relation Age of Onset  . Heart disease Mother   . Hypertension Mother   . Diverticulitis Mother   . Heart disease Father   . Hypertension Father       Review of Systems  Constitutional: Positive for fatigue. Negative for activity change, chills and unexpected weight change.  HENT: Negative for congestion, rhinorrhea, sneezing and sore throat.   Eyes: Negative.  Negative for photophobia, pain and redness.  Respiratory: Positive for shortness of breath. Negative for cough, chest tightness and wheezing.   Cardiovascular: Negative for chest pain and palpitations.  Gastrointestinal: Negative for abdominal pain, constipation, diarrhea, nausea and vomiting.  Endocrine: Negative for cold intolerance, heat intolerance, polydipsia, polyphagia and polyuria.       Currently on levothyroxine and is time for her to have thyroid panel checked.   Genitourinary: Positive for flank pain and pelvic pain. Negative for dysuria and frequency.  Musculoskeletal: Negative for arthralgias, back pain, joint swelling and neck pain.  Skin: Negative for rash.  Allergic/Immunologic: Positive for environmental allergies.  Neurological: Positive for weakness. Negative for dizziness, tremors, numbness and headaches.  Hematological: Negative for adenopathy. Does not bruise/bleed easily.  Psychiatric/Behavioral: Negative for behavioral problems and  sleep disturbance. The patient is not nervous/anxious.      Today's Vitals   01/08/18 1037  BP: (!) 156/68  Pulse: 68  Resp: 16  SpO2: 96%  Weight: 235 lb (106.6 kg)  Height: 5\' 7"  (1.702 m)    Physical Exam  Constitutional: She is oriented to person, place, and time. She appears well-developed and well-nourished. No distress.  HENT:  Head: Normocephalic and atraumatic.  Nose: Nose normal.  Mouth/Throat: Oropharynx is clear and moist. No oropharyngeal exudate.  Eyes: Pupils are equal, round, and reactive to light. Conjunctivae and EOM are normal.  Neck: Normal range of motion. Neck supple. No JVD present. No tracheal deviation present. No thyromegaly present.  Cardiovascular: Normal rate, regular rhythm, normal heart sounds and intact distal pulses. Exam reveals no gallop and no friction rub.  No murmur heard. Pulmonary/Chest: Effort normal and breath sounds normal. No respiratory distress. She has no wheezes. She has no rales. She exhibits no tenderness.  Abdominal: Soft. Bowel sounds are normal. There is no tenderness.  Musculoskeletal: Normal range of motion.  Moderate lower back pain, worse with exertion. Using a cane to help with mobility.   Lymphadenopathy:    She has no cervical adenopathy.  Neurological: She is alert and oriented to person, place, and time. No cranial nerve deficit.  She is at her  neurological baseline .  Skin: Skin is warm and dry. Capillary refill takes less than 2 seconds. She is not diaphoretic.  Psychiatric: She has a normal mood and affect. Her behavior is normal. Judgment and thought content normal.  Nursing note and vitals reviewed.  MMSE - Mini Mental State Exam 01/08/2018  Orientation to time 5  Orientation to Place 5  Registration 3  Attention/ Calculation 5  Recall 3  Language- name 2 objects 2  Language- repeat 1  Language- follow 3 step command 3  Language- read & follow direction 1  Write a sentence 1  Copy design 1  Total score  30   Functional Status Survey: Is the patient deaf or have difficulty hearing?: No Does the patient have difficulty seeing, even when wearing glasses/contacts?: No Does the patient have difficulty concentrating, remembering, or making decisions?: Yes(memory) Does the patient have difficulty walking or climbing stairs?: Yes Does the patient have difficulty dressing or bathing?: No Does the patient have difficulty doing errands alone such as visiting a doctor's office or shopping?: No   Fall Risk  01/08/2018 12/15/2017 08/04/2017 07/01/2017 06/16/2017  Falls in the past year? Yes Yes No Yes Yes  Number falls in past yr: 2 or more 2 or more 2 or more 1 1  Injury with Fall? No Yes - No No    Depression screen [facility] 2/9 12/15/2017 08/04/2017 07/01/2017 06/16/2017 05/27/2017  Decreased Interest 0 0 0 0 0  Down, Depressed, Hopeless 0 0 0 0 0  PHQ - 2 Score 0 0 0 0 0    LABS: Recent Results (from the past 2160 hour(s))  Iron, TIBC and Ferritin Panel     Status: None   Collection Time: 12/15/17 10:39 AM  Result Value Ref Range   Total Iron Binding Capacity 285 250 - 450 ug/dL   UIBC 235 118 - 369 ug/dL   Iron 50 27 - 139 ug/dL   Iron Saturation 18 15 - 55 %   Ferritin 70 15 - 150 ng/mL  CBC with Differential/Platelet     Status: None   Collection Time: 12/15/17 10:39 AM  Result Value Ref Range   WBC 5.8 3.4 - 10.8 x10E3/uL   RBC 4.91 3.77 - 5.28 x10E6/uL   Hemoglobin 15.2 11.1 - 15.9 g/dL   Hematocrit 43.4 34.0 - 46.6 %   MCV 88 79 - 97 fL   MCH 31.0 26.6 - 33.0 pg   MCHC 35.0 31.5 - 35.7 g/dL   RDW 13.9 12.3 - 15.4 %   Platelets 254 150 - 450 x10E3/uL   Neutrophils 52 Not Estab. %   Lymphs 32 Not Estab. %   Monocytes 10 Not Estab. %   Eos 4 Not Estab. %   Basos 1 Not Estab. %   Neutrophils Absolute 3.0 1.4 - 7.0 x10E3/uL   Lymphocytes Absolute 1.9 0.7 - 3.1 x10E3/uL   Monocytes Absolute 0.6 0.1 - 0.9 x10E3/uL   EOS (ABSOLUTE) 0.3 0.0 - 0.4 x10E3/uL   Basophils Absolute 0.1 0.0 - 0.2  x10E3/uL   Immature Granulocytes 1 Not Estab. %   Immature Grans (Abs) 0.0 0.0 - 0.1 x10E3/uL  Comprehensive metabolic panel     Status: Abnormal   Collection Time: 12/15/17 10:39 AM  Result Value Ref Range   Glucose 87 65 - 99 mg/dL   BUN 21 8 - 27 mg/dL   Creatinine, Ser 1.09 (H) 0.57 - 1.00 mg/dL   GFR calc non Af Amer 47 (L) >59 mL/min/1.73  GFR calc Af Amer 55 (L) >59 mL/min/1.73   BUN/Creatinine Ratio 19 12 - 28   Sodium 142 134 - 144 mmol/L   Potassium 4.5 3.5 - 5.2 mmol/L   Chloride 102 96 - 106 mmol/L   CO2 27 20 - 29 mmol/L   Calcium 9.2 8.7 - 10.3 mg/dL   Total Protein 6.6 6.0 - 8.5 g/dL   Albumin 4.3 3.5 - 4.7 g/dL   Globulin, Total 2.3 1.5 - 4.5 g/dL   Albumin/Globulin Ratio 1.9 1.2 - 2.2   Bilirubin Total 0.4 0.0 - 1.2 mg/dL   Alkaline Phosphatase 109 39 - 117 IU/L   AST 14 0 - 40 IU/L   ALT 8 0 - 32 IU/L  B12 and Folate Panel     Status: Abnormal   Collection Time: 12/15/17 10:39 AM  Result Value Ref Range   Vitamin B-12 >2000 (H) 232 - 1245 pg/mL   Folate 15.2 >3.0 ng/mL    Comment: A serum folate concentration of less than 3.1 ng/mL is considered to represent clinical deficiency.   T4, free     Status: None   Collection Time: 12/15/17 10:39 AM  Result Value Ref Range   Free T4 1.32 0.82 - 1.77 ng/dL  TSH     Status: None   Collection Time: 12/15/17 10:39 AM  Result Value Ref Range   TSH 1.710 0.450 - 4.500 uIU/mL  VITAMIN D 25 Hydroxy (Vit-D Deficiency, Fractures)     Status: Abnormal   Collection Time: 12/15/17 10:39 AM  Result Value Ref Range   Vit D, 25-Hydroxy 27.8 (L) 30.0 - 100.0 ng/mL    Comment: Vitamin D deficiency has been defined by the [place] and an Endocrine Society practice guideline as a level of serum 25-OH vitamin D less than 20 ng/mL (1,2). The Endocrine Society went on to further define vitamin D insufficiency as a level between 21 and 29 ng/mL (2). 1. IOM (Institute of Medicine). 2010. Dietary reference    intakes  for calcium and D. [place]: The    [employer]. 2. Holick MF, Binkley [place], Bischoff-Ferrari HA, et al.    Evaluation, treatment, and prevention of vitamin D    deficiency: an Endocrine Society clinical practice    guideline. JCEM. 2011 Jul; 96(7):1911-30.   UA/M w/rflx Culture, Routine     Status: Abnormal   Collection Time: 01/08/18 12:00 AM  Result Value Ref Range   Specific Gravity, UA 1.007 1.005 - 1.030   pH, UA 5.5 5.0 - 7.5   Color, UA Yellow Yellow   Appearance Ur Clear Clear   Leukocytes, UA 1+ (A) Negative   Protein, UA Negative Negative/Trace   Glucose, UA Negative Negative   Ketones, UA Negative Negative   RBC, UA Negative Negative   Bilirubin, UA Negative Negative   Urobilinogen, Ur 0.2 0.2 - 1.0 mg/dL   Nitrite, UA Positive (A) Negative   Microscopic Examination See below:     Comment: Microscopic was indicated and was performed.   Urinalysis Reflex Comment     Comment: This specimen has reflexed to a Urine Culture.  Microscopic Examination     Status: Abnormal   Collection Time: 01/08/18 12:00 AM  Result Value Ref Range   WBC, UA 6-10 (A) 0 - 5 /hpf   RBC, UA 0-2 0 - 2 /hpf   Epithelial Cells (non renal) 0-10 0 - 10 /hpf   Casts None seen None seen /lpf   Mucus, UA Present Not  Estab.   Bacteria, UA Few None seen/Few  Urine Culture, Reflex     Status: Abnormal   Collection Time: 01/08/18 12:00 AM  Result Value Ref Range   Urine Culture, Routine Final report (A)    Organism ID, Bacteria Escherichia coli (A)     Comment: Greater than 100,000 colony forming units per mL Cefazolin <=4 ug/mL Cefazolin with an MIC <=16 predicts susceptibility to the oral agents cefaclor, cefdinir, cefpodoxime, cefprozil, cefuroxime, cephalexin, and loracarbef when used for therapy of uncomplicated urinary tract infections due to E. coli, Klebsiella pneumoniae, and Proteus mirabilis.    Antimicrobial Susceptibility Comment     Comment:       ** S =  Susceptible; I = Intermediate; R = Resistant **                    P = Positive; N = Negative             MICS are expressed in micrograms per mL    Antibiotic                 RSLT#1    RSLT#2    RSLT#3    RSLT#4 Amoxicillin/Clavulanic Acid    S Ampicillin                     S Cefepime                       S Ceftriaxone                    S Cefuroxime                     S Ciprofloxacin                  S Ertapenem                      S Gentamicin                     S Imipenem                       S Levofloxacin                   S Meropenem                      S Nitrofurantoin                 S Piperacillin/Tazobactam        S Tetracycline                   S Tobramycin                     S Trimethoprim/Sulfa             S   Specimen status report     Status: None   Collection Time: 01/08/18 12:00 AM  Result Value Ref Range   specimen status report Comment     Comment: Please note Please note The date and/or time of collection was not indicated on the requisition as required by state and federal law.  The date of receipt of the specimen was used as the collection date if not supplied.     Assessment/Plan: 1. Encounter for general adult medical  examination with abnormal findings Annual health maintenance exam today.   2. Essential hypertension enerally stable. Continue bp medication as prescribed. Monitor closely.  3. Inflammatory polyarthropathy (HCC) Continue to take celebrex every day to keep pain/inflammation manageable.   4. Chronic low back pain, unspecified back pain laterality, with sciatica presence unspecified May take hydrocodone/apap 5/325mg  tablets as needed for severe pain. A prescription for #20 tablets was sent to her pharmacy. Reviewed risk factors and side effects associated with taking narcotic pain relievers.  - HYDROcodone-acetaminophen (NORCO/VICODIN) 5-325 MG tablet; Take 1 tablet by mouth every 4 (four) hours as needed for moderate pain.   Dispense: 20 tablet; Refill: 0  5. Need for shingles vaccine - Zoster Vaccine Adjuvanted [facility]) injection; Shingles vaccine series - inject IM as directed .  Dispense: 0.5 mL; Refill: 1  6. Need for vaccination against Streptococcus pneumoniae using pneumococcal conjugate vaccine 13 - Pneumococcal conjugate vaccine 13-valent IM  7. Dysuria - UA/M w/rflx Culture, Routine  General Counseling: Margaretta verbalizes understanding of the findings of todays visit and agrees with plan of treatment. I have discussed any further diagnostic evaluation that may be needed or ordered today. We also reviewed her medications today. she has been encouraged to call the office with any questions or concerns that should arise related to todays visit.    Counseling:  Hypertension Counseling:   The following hypertensive lifestyle modification were recommended and discussed:  1. Limiting alcohol intake to less than 1 oz/day of ethanol:(24 oz of beer or 8 oz of wine or 2 oz of 100-proof whiskey). 2. Take baby ASA 81 mg daily. 3. Importance of regular aerobic exercise and losing weight. 4. Reduce dietary saturated fat and cholesterol intake for overall cardiovascular health. 5. Maintaining adequate dietary potassium, calcium, and magnesium intake. 6. Regular monitoring of the blood pressure. 7. Reduce sodium intake to less than 100 mmol/day (less than 2.3 gm of sodium or less than 6 gm of sodium choride)   This patient was seen by [place] with Dr Lavera Guise as a part of collaborative care agreement  Orders Placed This Encounter  Procedures  . Pneumococcal conjugate vaccine 13-valent IM  . UA/M w/rflx Culture, Routine    Meds ordered this encounter  Medications  . HYDROcodone-acetaminophen (NORCO/VICODIN) 5-325 MG tablet    Sig: Take 1 tablet by mouth every 4 (four) hours as needed for moderate pain.    Dispense:  20 tablet    Refill:  0    Order Specific Question:    Supervising Provider    Answer:   Lavera Guise [[id number]]  . Zoster Vaccine Adjuvanted [facility]) injection    Sig: Shingles vaccine series - inject IM as directed .    Dispense:  0.5 mL    Refill:  1    Order Specific Question:   Supervising Provider    Answer:   Lavera Guise [[id number]]    Time spent: [place], MD  Internal Medicine

## 2018-01-13 ENCOUNTER — Telehealth: Payer: Self-pay

## 2018-01-13 ENCOUNTER — Other Ambulatory Visit: Payer: Self-pay | Admitting: Nurse Practitioner

## 2018-01-13 DIAGNOSIS — R300 Dysuria: Secondary | ICD-10-CM | POA: Insufficient documentation

## 2018-01-13 DIAGNOSIS — M545 Low back pain, unspecified: Secondary | ICD-10-CM | POA: Insufficient documentation

## 2018-01-13 DIAGNOSIS — R319 Hematuria, unspecified: Principal | ICD-10-CM

## 2018-01-13 DIAGNOSIS — Z0001 Encounter for general adult medical examination with abnormal findings: Secondary | ICD-10-CM | POA: Insufficient documentation

## 2018-01-13 DIAGNOSIS — Z23 Encounter for immunization: Secondary | ICD-10-CM | POA: Insufficient documentation

## 2018-01-13 DIAGNOSIS — G8929 Other chronic pain: Secondary | ICD-10-CM | POA: Insufficient documentation

## 2018-01-13 DIAGNOSIS — N390 Urinary tract infection, site not specified: Secondary | ICD-10-CM

## 2018-01-13 LAB — MICROSCOPIC EXAMINATION: Casts: NONE SEEN /lpf

## 2018-01-13 LAB — URINE CULTURE, REFLEX

## 2018-01-13 LAB — UA/M W/RFLX CULTURE, ROUTINE
Bilirubin, UA: NEGATIVE
Glucose, UA: NEGATIVE
Ketones, UA: NEGATIVE
Nitrite, UA: POSITIVE — AB
Protein, UA: NEGATIVE
RBC, UA: NEGATIVE
Specific Gravity, UA: 1.007 (ref 1.005–1.030)
Urobilinogen, Ur: 0.2 mg/dL (ref 0.2–1.0)
pH, UA: 5.5 (ref 5.0–7.5)

## 2018-01-13 LAB — SPECIMEN STATUS REPORT

## 2018-01-13 MED ORDER — NITROFURANTOIN MONOHYD MACRO 100 MG PO CAPS: 100.0000 mg | ORAL_CAPSULE | Freq: Two times a day (BID) | ORAL | 0 refills | Status: DC

## 2018-01-13 NOTE — Telephone Encounter (Signed)
-----   Message from Ronnell Freshwater, NP sent at 01/13/2018  1:51 PM EDT ----- Please let the patient know that Urine sample from CPE done 01/08/2018 showed evidence of uti. Start macrobid 100mg  bid for 10 days. New rx sent to her pharmacy. thanks

## 2018-01-13 NOTE — Telephone Encounter (Signed)
Informed pt of results and also let her know that her rx is at her pharmacy.

## 2018-01-13 NOTE — Progress Notes (Signed)
Urine sample from CPE done 01/08/2018 showed evidence of uti. Start macrobid 100mg  bid for 10 days. New rx sent to her pharmacy.

## 2018-01-26 ENCOUNTER — Encounter: Payer: Self-pay | Admitting: Nurse Practitioner

## 2018-01-26 ENCOUNTER — Ambulatory Visit (INDEPENDENT_AMBULATORY_CARE_PROVIDER_SITE_OTHER): Payer: Medicare HMO | Admitting: Nurse Practitioner

## 2018-01-26 VITALS — BP 148/75 | HR 88 | Temp 96.5°F | Resp 16 | Ht 67.0 in | Wt 239.2 lb

## 2018-01-26 DIAGNOSIS — R079 Chest pain, unspecified: Secondary | ICD-10-CM

## 2018-01-26 DIAGNOSIS — I10 Essential (primary) hypertension: Secondary | ICD-10-CM | POA: Diagnosis not present

## 2018-01-26 DIAGNOSIS — K219 Gastro-esophageal reflux disease without esophagitis: Secondary | ICD-10-CM

## 2018-01-26 DIAGNOSIS — R300 Dysuria: Secondary | ICD-10-CM

## 2018-01-26 DIAGNOSIS — J069 Acute upper respiratory infection, unspecified: Secondary | ICD-10-CM | POA: Diagnosis not present

## 2018-01-26 LAB — POCT URINALYSIS DIPSTICK
Appearance: NEGATIVE
Bilirubin, UA: NEGATIVE
Blood, UA: NEGATIVE
Glucose, UA: NEGATIVE
Ketones, UA: NEGATIVE
Leukocytes, UA: NEGATIVE
Protein, UA: POSITIVE — AB
Spec Grav, UA: 1.01 (ref 1.010–1.025)
Urobilinogen, UA: 0.2 E.U./dL
pH, UA: 5 (ref 5.0–8.0)

## 2018-01-26 MED ORDER — OMEPRAZOLE 40 MG PO CPDR: 40.0000 mg | DELAYED_RELEASE_CAPSULE | Freq: Every day | ORAL | 3 refills | Status: DC

## 2018-01-26 MED ORDER — AMOXICILLIN-POT CLAVULANATE 875-125 MG PO TABS: 1.0000 | ORAL_TABLET | Freq: Two times a day (BID) | ORAL | 0 refills | Status: DC

## 2018-01-26 NOTE — Progress Notes (Signed)
[facility] [place], [place] [address]  Internal MEDICINE  Office Visit Note  Patient Name: Christy Carter  [id number]  [id number]  Date of Service: 02/03/2018   Pt is here for a sick visit.  Chief Complaint  Patient presents with  . Fatigue    has been feeling drained since saturday. no fever or chills. legs weak and gives out on her  . Breathing Problem    pt having hard time breathing been going on since saturday. pt took zantac for intergestion over the weekend due to her feeling like her chest was tight.   . Chest Pain    with shortness of breath     The patient has been having shortness of breast and weakness since Saturday. Has bad taste in her mouth. Tastes like mucus in her mouth. Gives out after walking for short period of time.        Current Medication:  Outpatient Encounter Medications as of 01/26/2018  Medication Sig  . celecoxib (CELEBREX) 200 MG capsule Take 1 capsule (200 mg total) by mouth daily.  Marland Kitchen gabapentin (NEURONTIN) 100 MG capsule Take 100 mg by mouth 2 (two) times daily.  Marland Kitchen HYDROcodone-acetaminophen (NORCO/VICODIN) 5-325 MG tablet Take 1 tablet by mouth every 4 (four) hours as needed for moderate pain.  Marland Kitchen levothyroxine (SYNTHROID, LEVOTHROID) 100 MCG tablet TAKE 1 TABLET EVERY DAY  . metroNIDAZOLE (METROGEL) 0.75 % gel Apply 1 application topically every evening.  . sertraline (ZOLOFT) 25 MG tablet Take 25 mg by mouth every evening.  . simvastatin (ZOCOR) 20 MG tablet TAKE 1 TABLET AT BEDTIME  . traMADol (ULTRAM) 50 MG tablet Take 1 tablet (50 mg total) by mouth every 8 (eight) hours as needed.  . triamterene-hydrochlorothiazide (MAXZIDE-25) 37.5-25 MG per tablet Take 1 tablet by mouth daily.  . vitamin B-12 (CYANOCOBALAMIN) 500 MCG tablet Take 500 mcg by mouth daily.  . Vitamin D, Ergocalciferol, (DRISDOL) 50000 units CAPS capsule TAKE ONE CAPSULE BY MOUTH WEEKLY FOR 12 WEEKS  . vitamin E 200 UNIT capsule Take 200 Units  by mouth daily.  Marland Kitchen Zoster Vaccine Adjuvanted [facility]) injection Shingles vaccine series - inject IM as directed .  Marland Kitchen Zoster Vaccine Live, PF, (ZOSTAVAX) 01751 UNT/0.65ML injection Inject 0.65 mLs into the skin once.  . [DISCONTINUED] albuterol (PROVENTIL HFA;VENTOLIN HFA) 108 (90 Base) MCG/ACT inhaler Inhale 2 puffs into the lungs every 6 (six) hours as needed for wheezing or shortness of breath.  . [DISCONTINUED] nitrofurantoin, macrocrystal-monohydrate, (MACROBID) 100 MG capsule Take 1 capsule (100 mg total) by mouth 2 (two) times daily.  . [DISCONTINUED] omeprazole (PRILOSEC) 20 MG capsule Take 20 mg by mouth daily.  Marland Kitchen amoxicillin-clavulanate (AUGMENTIN) 875-125 MG tablet Take 1 tablet by mouth 2 (two) times daily.  . clotrimazole-betamethasone (LOTRISONE) cream Apply 1 application topically 2 (two) times daily. Apply to the affected area only at night (Patient not taking: Reported on 01/26/2018)  . conjugated estrogens (PREMARIN) vaginal cream USE PEA SIZE AND APPLY INTERNALY IN THE VAGINA ONCE A WEEK (Patient not taking: Reported on 01/08/2018)  . fluconazole (DIFLUCAN) 150 MG tablet Take one tab a day for 3 days and then once a week (Patient not taking: Reported on 01/08/2018)  . naproxen (NAPROSYN) 500 MG tablet Take 1 tablet (500 mg total) by mouth 2 (two) times daily with a meal. (Patient not taking: Reported on 01/08/2018)  . nystatin (MYCOSTATIN/NYSTOP) powder Apply topically 2 (two) times daily. (Patient not taking: Reported on 01/08/2018)  . omeprazole (  PRILOSEC) 40 MG capsule Take 1 capsule (40 mg total) by mouth daily.   No facility-administered encounter medications on file as of 01/26/2018.       Medical History: Past Medical History:  Diagnosis Date  . Arthritis   . Depression   . Hyperlipidemia   . Hypertension   . Shortness of breath   . Stroke ([place])   . Thyroid disease     Review of Systems  Constitutional: Positive for fatigue. Negative for activity change, chills  and unexpected weight change.  HENT: Negative for congestion, rhinorrhea, sneezing and sore throat.   Eyes: Negative.  Negative for photophobia, pain and redness.  Respiratory: Positive for chest tightness and shortness of breath. Negative for cough and wheezing.   Cardiovascular: Positive for chest pain. Negative for palpitations.  Gastrointestinal: Positive for nausea. Negative for abdominal pain, constipation, diarrhea and vomiting.  Endocrine: Negative for cold intolerance, heat intolerance, polydipsia, polyphagia and polyuria.  Genitourinary: Negative for dysuria, flank pain, frequency and pelvic pain.  Musculoskeletal: Negative for arthralgias, back pain, joint swelling and neck pain.  Skin: Negative for rash.  Allergic/Immunologic: Positive for environmental allergies.  Neurological: Positive for weakness. Negative for dizziness, tremors, numbness and headaches.  Hematological: Negative for adenopathy. Does not bruise/bleed easily.  Psychiatric/Behavioral: Negative for behavioral problems and sleep disturbance. The patient is not nervous/anxious.      Today's Vitals   01/26/18 1024  BP: (!) 148/75  Pulse: 88  Resp: 16  Temp: (!) 96.5 F (35.8 C)  SpO2: 91%  Weight: 239 lb 3.2 oz (108.5 kg)  Height: 5\' 7"  (1.702 m)   Physical Exam  Constitutional: She is oriented to person, place, and time. She appears well-developed and well-nourished. No distress.  HENT:  Head: Normocephalic and atraumatic.  Nose: Nose normal.  Mouth/Throat: Oropharynx is clear and moist. No oropharyngeal exudate.  Eyes: Pupils are equal, round, and reactive to light. Conjunctivae and EOM are normal.  Neck: Normal range of motion. Neck supple. No JVD present. No tracheal deviation present. No thyromegaly present.  Cardiovascular: Normal rate, regular rhythm, normal heart sounds and intact distal pulses. Exam reveals no gallop and no friction rub.  No murmur heard. ECG today showed non specific t-wave  abnormality but was otherwise without acute abnormality.   Pulmonary/Chest: Effort normal and breath sounds normal. No respiratory distress. She has no wheezes. She has no rales. She exhibits no tenderness.  Abdominal: Soft. Bowel sounds are normal. There is no tenderness.  Genitourinary:  Genitourinary Comments: Urine sample was positive for protein only.   Musculoskeletal: Normal range of motion.  Moderate lower back pain, worse with exertion. Using a cane to help with mobility.   Lymphadenopathy:    She has no cervical adenopathy.  Neurological: She is alert and oriented to person, place, and time. No cranial nerve deficit.  She is at her neurological baseline .  Skin: Skin is warm and dry. Capillary refill takes less than 2 seconds. She is not diaphoretic.  Psychiatric: She has a normal mood and affect. Her behavior is normal. Judgment and thought content normal.  Nursing note and vitals reviewed.  Assessment/Plan: 1. Chest pain, unspecified type - EKG 12-Lead showed non specific t-wave abnormality but no acute problems noted. Likely related to URI. Will continue to monitor.   2. Acute upper respiratory infection Add augmentin 875mg  twice daily for 10 days. Take OTC medications to alleviate symptoms.  - amoxicillin-clavulanate (AUGMENTIN) 875-125 MG tablet; Take 1 tablet by mouth 2 (two) times  daily.  Dispense: 20 tablet; Refill: 0  3. Gastroesophageal reflux disease without esophagitis - omeprazole (PRILOSEC) 40 MG capsule; Take 1 capsule (40 mg total) by mouth daily.  Dispense: 30 capsule; Refill: 3  4. Essential hypertension Stable. Continue bp medication as prescribed.   5. Dysuria - POCT Urinalysis Dipstick  General Counseling: Kysha verbalizes understanding of the findings of todays visit and agrees with plan of treatment. I have discussed any further diagnostic evaluation that may be needed or ordered today. We also reviewed her medications today. she has been encouraged to  call the office with any questions or concerns that should arise related to todays visit.    Counseling:  Rest and increase fluids. Continue using OTC medication to control symptoms.   This patient was seen by Leretha Pol FNP Collaboration with Dr Lavera Guise as a part of collaborative care agreement  Orders Placed This Encounter  Procedures  . POCT Urinalysis Dipstick  . EKG 12-Lead    Meds ordered this encounter  Medications  . amoxicillin-clavulanate (AUGMENTIN) 875-125 MG tablet    Sig: Take 1 tablet by mouth 2 (two) times daily.    Dispense:  20 tablet    Refill:  0    Order Specific Question:   Supervising Provider    Answer:   Lavera Guise [[id number]]  . omeprazole (PRILOSEC) 40 MG capsule    Sig: Take 1 capsule (40 mg total) by mouth daily.    Dispense:  30 capsule    Refill:  3    Order Specific Question:   Supervising Provider    Answer:   Lavera Guise [[id number]]    Time spent: 30 Minutes

## 2018-01-29 ENCOUNTER — Other Ambulatory Visit: Payer: Self-pay

## 2018-01-29 DIAGNOSIS — R062 Wheezing: Secondary | ICD-10-CM

## 2018-01-29 MED ORDER — ALBUTEROL SULFATE HFA 108 (90 BASE) MCG/ACT IN AERS: 2.0000 | INHALATION_SPRAY | Freq: Four times a day (QID) | RESPIRATORY_TRACT | 1 refills | Status: DC | PRN

## 2018-02-03 DIAGNOSIS — K219 Gastro-esophageal reflux disease without esophagitis: Secondary | ICD-10-CM | POA: Insufficient documentation

## 2018-02-03 DIAGNOSIS — J069 Acute upper respiratory infection, unspecified: Secondary | ICD-10-CM | POA: Insufficient documentation

## 2018-02-03 DIAGNOSIS — R079 Chest pain, unspecified: Secondary | ICD-10-CM | POA: Insufficient documentation

## 2018-02-10 ENCOUNTER — Other Ambulatory Visit: Payer: Self-pay | Admitting: Nurse Practitioner

## 2018-02-10 ENCOUNTER — Ambulatory Visit (INDEPENDENT_AMBULATORY_CARE_PROVIDER_SITE_OTHER): Payer: Medicare HMO | Admitting: Nurse Practitioner

## 2018-02-10 ENCOUNTER — Telehealth: Payer: Self-pay | Admitting: Nurse Practitioner

## 2018-02-10 ENCOUNTER — Encounter: Payer: Self-pay | Admitting: Nurse Practitioner

## 2018-02-10 VITALS — BP 151/68 | HR 71 | Resp 16 | Ht 67.0 in | Wt 229.0 lb

## 2018-02-10 DIAGNOSIS — R413 Other amnesia: Secondary | ICD-10-CM | POA: Insufficient documentation

## 2018-02-10 DIAGNOSIS — J069 Acute upper respiratory infection, unspecified: Secondary | ICD-10-CM | POA: Diagnosis not present

## 2018-02-10 DIAGNOSIS — I10 Essential (primary) hypertension: Secondary | ICD-10-CM | POA: Diagnosis not present

## 2018-02-10 DIAGNOSIS — Z23 Encounter for immunization: Secondary | ICD-10-CM

## 2018-02-10 DIAGNOSIS — M064 Inflammatory polyarthropathy: Secondary | ICD-10-CM

## 2018-02-10 MED ORDER — CELECOXIB 200 MG PO CAPS: 200.0000 mg | ORAL_CAPSULE | Freq: Every day | ORAL | 3 refills | Status: DC

## 2018-02-10 NOTE — Progress Notes (Signed)
[facility] [place], [place] [address]  Internal MEDICINE  Office Visit Note  Patient Name: Christy Carter  [id number]  [id number]  Date of Service: 02/10/2018  Chief Complaint  Patient presents with  . Shortness of Breath    follow up upper resp , doing much better, no recent chest pain  . Memory Loss    pt is concerned about short term memory loss     The patient has been having shortness of breath and weakness since Saturday. Has bad taste in her mouth. Tastes like mucus in her mouth. Gives out after walking for short period of time. She was treated with augentin for 10 days. Today, she is feeling much better today.  She is concerned about memory loss. States that she especially has trouble remembering people's names. Watches "stories" on TV but can forget the character's names. Will also forget tiny details about certain situations. Mini mental status exam was administered today. She scored 30/30,       Current Medication: Outpatient Encounter Medications as of 02/10/2018  Medication Sig  . gabapentin (NEURONTIN) 100 MG capsule Take 100 mg by mouth 2 (two) times daily.  Marland Kitchen HYDROcodone-acetaminophen (NORCO/VICODIN) 5-325 MG tablet Take 1 tablet by mouth every 4 (four) hours as needed for moderate pain.  Marland Kitchen levothyroxine (SYNTHROID, LEVOTHROID) 100 MCG tablet TAKE 1 TABLET EVERY DAY  . metroNIDAZOLE (METROGEL) 0.75 % gel Apply 1 application topically every evening.  Marland Kitchen omeprazole (PRILOSEC) 40 MG capsule Take 1 capsule (40 mg total) by mouth daily.  . sertraline (ZOLOFT) 25 MG tablet Take 25 mg by mouth every evening.  . simvastatin (ZOCOR) 20 MG tablet TAKE 1 TABLET AT BEDTIME  . traMADol (ULTRAM) 50 MG tablet Take 1 tablet (50 mg total) by mouth every 8 (eight) hours as needed.  . triamterene-hydrochlorothiazide (MAXZIDE-25) 37.5-25 MG per tablet Take 1 tablet by mouth daily.  . vitamin B-12 (CYANOCOBALAMIN) 500 MCG tablet Take 500 mcg by mouth daily.  .  Vitamin D, Ergocalciferol, (DRISDOL) 50000 units CAPS capsule TAKE ONE CAPSULE BY MOUTH WEEKLY FOR 12 WEEKS  . vitamin E 200 UNIT capsule Take 200 Units by mouth daily.  Marland Kitchen albuterol (PROVENTIL HFA;VENTOLIN HFA) 108 (90 Base) MCG/ACT inhaler Inhale 2 puffs into the lungs every 6 (six) hours as needed for wheezing or shortness of breath.  . celecoxib (CELEBREX) 200 MG capsule Take 1 capsule (200 mg total) by mouth daily.  . fluconazole (DIFLUCAN) 150 MG tablet Take one tab a day for 3 days and then once a week (Patient not taking: Reported on 01/08/2018)  . naproxen (NAPROSYN) 500 MG tablet Take 1 tablet (500 mg total) by mouth 2 (two) times daily with a meal. (Patient not taking: Reported on 01/08/2018)  . Zoster Vaccine Adjuvanted [facility]) injection Shingles vaccine series - inject IM as directed .  Marland Kitchen Zoster Vaccine Live, PF, (ZOSTAVAX) 79024 UNT/0.65ML injection Inject 0.65 mLs into the skin once.  . [DISCONTINUED] amoxicillin-clavulanate (AUGMENTIN) 875-125 MG tablet Take 1 tablet by mouth 2 (two) times daily. (Patient not taking: Reported on 02/10/2018)  . [DISCONTINUED] clotrimazole-betamethasone (LOTRISONE) cream Apply 1 application topically 2 (two) times daily. Apply to the affected area only at night (Patient not taking: Reported on 01/26/2018)  . [DISCONTINUED] conjugated estrogens (PREMARIN) vaginal cream USE PEA SIZE AND APPLY INTERNALY IN THE VAGINA ONCE A WEEK (Patient not taking: Reported on 01/08/2018)  . [DISCONTINUED] nystatin (MYCOSTATIN/NYSTOP) powder Apply topically 2 (two) times daily. (Patient not taking: Reported on  01/08/2018)   No facility-administered encounter medications on file as of 02/10/2018.     Surgical History: Past Surgical History:  Procedure Laterality Date  . APPENDECTOMY    . BACK SURGERY    . CHOLECYSTECTOMY    . HERNIA REPAIR      Medical History: Past Medical History:  Diagnosis Date  . Arthritis   . Depression   . Hyperlipidemia   . Hypertension    . Shortness of breath   . Stroke ([place])   . Thyroid disease     Family History: Family History  Problem Relation Age of Onset  . Heart disease Mother   . Hypertension Mother   . Diverticulitis Mother   . Heart disease Father   . Hypertension Father     Social History   Socioeconomic History  . Marital status: Widowed    Spouse name: Not on file  . Number of children: Not on file  . Years of education: Not on file  . Highest education level: Not on file  Occupational History  . Not on file  Social Needs  . Financial resource strain: Not on file  . Food insecurity:    Worry: Not on file    Inability: Not on file  . Transportation needs:    Medical: Not on file    Non-medical: Not on file  Tobacco Use  . Smoking status: Former Smoker    Types: Cigarettes    Last attempt to quit: 08/15/1992    Years since quitting: 25.5  . Smokeless tobacco: Never Used  Substance and Sexual Activity  . Alcohol use: No  . Drug use: No  . Sexual activity: Not on file  Lifestyle  . Physical activity:    Days per week: Not on file    Minutes per session: Not on file  . Stress: Not on file  Relationships  . Social connections:    Talks on phone: Not on file    Gets together: Not on file    Attends religious service: Not on file    Active member of club or organization: Not on file    Attends meetings of clubs or organizations: Not on file    Relationship status: Not on file  . Intimate partner violence:    Fear of current or ex partner: Not on file    Emotionally abused: Not on file    Physically abused: Not on file    Forced sexual activity: Not on file  Other Topics Concern  . Not on file  Social History Narrative  . Not on file      Review of Systems  Constitutional: Positive for fatigue. Negative for activity change, chills and unexpected weight change.  HENT: Negative for congestion, rhinorrhea, sneezing and sore throat.   Eyes: Negative.  Negative for photophobia,  pain and redness.  Respiratory: Positive for shortness of breath. Negative for cough, chest tightness and wheezing.   Cardiovascular: Negative for chest pain and palpitations.  Gastrointestinal: Negative for abdominal pain, constipation, diarrhea, nausea and vomiting.  Endocrine: Negative for cold intolerance, heat intolerance, polydipsia, polyphagia and polyuria.  Genitourinary: Negative for dysuria, flank pain, frequency and pelvic pain.  Musculoskeletal: Negative for arthralgias, back pain, joint swelling and neck pain.  Skin: Negative for rash.  Allergic/Immunologic: Positive for environmental allergies.  Neurological: Positive for weakness. Negative for dizziness, tremors, numbness and headaches.  Hematological: Negative for adenopathy. Does not bruise/bleed easily.  Psychiatric/Behavioral: Negative for behavioral problems, dysphoric mood and sleep disturbance. The  patient is not nervous/anxious.     Today's Vitals   02/10/18 1041  BP: (!) 151/68  Pulse: 71  Resp: 16  SpO2: 93%  Weight: 229 lb (103.9 kg)  Height: 5\' 7"  (1.702 m)    Physical Exam  Constitutional: She is oriented to person, place, and time. She appears well-developed and well-nourished. No distress.  HENT:  Head: Normocephalic and atraumatic.  Nose: Nose normal.  Mouth/Throat: Oropharynx is clear and moist. No oropharyngeal exudate.  Eyes: Pupils are equal, round, and reactive to light. Conjunctivae and EOM are normal.  Neck: Normal range of motion. Neck supple. No JVD present. No tracheal deviation present. No thyromegaly present.  Cardiovascular: Normal rate, regular rhythm and normal heart sounds. Exam reveals no gallop and no friction rub.  No murmur heard. Pulmonary/Chest: Effort normal and breath sounds normal. No respiratory distress. She has no wheezes. She has no rales. She exhibits no tenderness.  Abdominal: Soft. Bowel sounds are normal. There is no tenderness.  Genitourinary:  Genitourinary  Comments: Urine sample was positive for protein only.   Musculoskeletal: Normal range of motion.  Moderate lower back pain, worse with exertion. Using a cane to help with mobility.   Lymphadenopathy:    She has no cervical adenopathy.  Neurological: She is alert and oriented to person, place, and time. No cranial nerve deficit.  She is at her neurological baseline .  Skin: Skin is warm and dry. Capillary refill takes less than 2 seconds. She is not diaphoretic.  Psychiatric: She has a normal mood and affect. Her behavior is normal. Judgment and thought content normal.  Nursing note and vitals reviewed.  MMSE - Mini Mental State Exam 02/10/2018 02/10/2018 01/08/2018  Orientation to time 5 5 5   Orientation to Place 5 5 5   Registration 3 3 3   Attention/ Calculation 5 5 5   Recall 3 3 3   Language- name 2 objects 2 2 2   Language- repeat 1 1 1   Language- follow 3 step command 3 3 3   Language- read & follow direction 1 - 1  Write a sentence 1 1 1   Copy design 1 1 1   Total score 30 - 30   Assessment/Plan:  1. Acute upper respiratory infection resolvedd after treatment with antibiotics   2. Essential hypertension Generally stable. Continue bp medication as prescribed   3. Memory loss, short term Mini-mental status exam given at today's visit. Patient scoring 30/30. Will continue to monitor.   4. Flu vaccine need - Flu Vaccine MDCK QUAD PF   General Counseling: Jovana verbalizes understanding of the findings of todays visit and agrees with plan of treatment. I have discussed any further diagnostic evaluation that may be needed or ordered today. We also reviewed her medications today. she has been encouraged to call the office with any questions or concerns that should arise related to todays visit.  This patient was seen by Leretha Pol FNP Collaboration with Dr Lavera Guise as a part of collaborative care agreement  Orders Placed This Encounter  Procedures  . Flu Vaccine MDCK QUAD  PF    Time spent: 33 Minutes      Dr Lavera Guise Internal medicine

## 2018-02-11 ENCOUNTER — Emergency Department: Payer: Medicare HMO

## 2018-02-11 ENCOUNTER — Inpatient Hospital Stay
Admission: EM | Admit: 2018-02-11 | Discharge: 2018-02-14 | DRG: 871 | Disposition: A | Discharge status: Home / Self Care | Payer: Medicare HMO | Attending: Internal Medicine | Admitting: Internal Medicine

## 2018-02-11 ENCOUNTER — Other Ambulatory Visit: Payer: Self-pay

## 2018-02-11 DIAGNOSIS — E039 Hypothyroidism, unspecified: Secondary | ICD-10-CM | POA: Diagnosis not present

## 2018-02-11 DIAGNOSIS — Z79899 Other long term (current) drug therapy: Secondary | ICD-10-CM | POA: Diagnosis not present

## 2018-02-11 DIAGNOSIS — K219 Gastro-esophageal reflux disease without esophagitis: Secondary | ICD-10-CM | POA: Diagnosis not present

## 2018-02-11 DIAGNOSIS — Z791 Long term (current) use of non-steroidal anti-inflammatories (NSAID): Secondary | ICD-10-CM | POA: Diagnosis not present

## 2018-02-11 DIAGNOSIS — I10 Essential (primary) hypertension: Secondary | ICD-10-CM | POA: Diagnosis not present

## 2018-02-11 DIAGNOSIS — I129 Hypertensive chronic kidney disease with stage 1 through stage 4 chronic kidney disease, or unspecified chronic kidney disease: Secondary | ICD-10-CM | POA: Diagnosis not present

## 2018-02-11 DIAGNOSIS — Z8673 Personal history of transient ischemic attack (TIA), and cerebral infarction without residual deficits: Secondary | ICD-10-CM

## 2018-02-11 DIAGNOSIS — Z87891 Personal history of nicotine dependence: Secondary | ICD-10-CM

## 2018-02-11 DIAGNOSIS — A419 Sepsis, unspecified organism: Secondary | ICD-10-CM | POA: Diagnosis not present

## 2018-02-11 DIAGNOSIS — E876 Hypokalemia: Secondary | ICD-10-CM | POA: Diagnosis present

## 2018-02-11 DIAGNOSIS — M1990 Unspecified osteoarthritis, unspecified site: Secondary | ICD-10-CM | POA: Diagnosis present

## 2018-02-11 DIAGNOSIS — R911 Solitary pulmonary nodule: Secondary | ICD-10-CM | POA: Diagnosis not present

## 2018-02-11 DIAGNOSIS — N183 Chronic kidney disease, stage 3 unspecified: Secondary | ICD-10-CM | POA: Diagnosis present

## 2018-02-11 DIAGNOSIS — R0602 Shortness of breath: Secondary | ICD-10-CM | POA: Diagnosis not present

## 2018-02-11 DIAGNOSIS — Z8249 Family history of ischemic heart disease and other diseases of the circulatory system: Secondary | ICD-10-CM | POA: Diagnosis not present

## 2018-02-11 DIAGNOSIS — Z79890 Hormone replacement therapy (postmenopausal): Secondary | ICD-10-CM

## 2018-02-11 DIAGNOSIS — E785 Hyperlipidemia, unspecified: Secondary | ICD-10-CM | POA: Diagnosis present

## 2018-02-11 DIAGNOSIS — R000 Tachycardia, unspecified: Secondary | ICD-10-CM | POA: Diagnosis not present

## 2018-02-11 DIAGNOSIS — R079 Chest pain, unspecified: Secondary | ICD-10-CM | POA: Diagnosis not present

## 2018-02-11 DIAGNOSIS — J189 Pneumonia, unspecified organism: Secondary | ICD-10-CM | POA: Diagnosis present

## 2018-02-11 DIAGNOSIS — R918 Other nonspecific abnormal finding of lung field: Secondary | ICD-10-CM | POA: Diagnosis not present

## 2018-02-11 LAB — COMPREHENSIVE METABOLIC PANEL
ALT: 13 U/L (ref 0–44)
AST: 21 U/L (ref 15–41)
Albumin: 3.8 g/dL (ref 3.5–5.0)
Alkaline Phosphatase: 85 U/L (ref 38–126)
Anion gap: 11 (ref 5–15)
BUN: 26 mg/dL — ABNORMAL HIGH (ref 8–23)
CO2: 24 mmol/L (ref 22–32)
Calcium: 8.8 mg/dL — ABNORMAL LOW (ref 8.9–10.3)
Chloride: 103 mmol/L (ref 98–111)
Creatinine, Ser: 1.23 mg/dL — ABNORMAL HIGH (ref 0.44–1.00)
GFR calc Af Amer: 46 mL/min — ABNORMAL LOW (ref >60)
GFR calc non Af Amer: 40 mL/min — ABNORMAL LOW (ref >60)
Glucose, Bld: 172 mg/dL — ABNORMAL HIGH (ref 70–99)
Potassium: 3.3 mmol/L — ABNORMAL LOW (ref 3.5–5.1)
Sodium: 138 mmol/L (ref 135–145)
Total Bilirubin: 0.6 mg/dL (ref 0.3–1.2)
Total Protein: 7.4 g/dL (ref 6.5–8.1)

## 2018-02-11 LAB — CBC WITH DIFFERENTIAL/PLATELET
Basophils Absolute: 0 10*3/uL (ref 0–0.1)
Basophils Relative: 0 %
Eosinophils Absolute: 0 10*3/uL (ref 0–0.7)
Eosinophils Relative: 0 %
HCT: 41.6 % (ref 35.0–47.0)
Hemoglobin: 14.4 g/dL (ref 12.0–16.0)
Lymphocytes Relative: 1 %
Lymphs Abs: 0.3 10*3/uL — ABNORMAL LOW (ref 1.0–3.6)
MCH: 31 pg (ref 26.0–34.0)
MCHC: 34.6 g/dL (ref 32.0–36.0)
MCV: 89.6 fL (ref 80.0–100.0)
Monocytes Absolute: 0.6 10*3/uL (ref 0.2–0.9)
Monocytes Relative: 2 %
Neutro Abs: 22.5 10*3/uL — ABNORMAL HIGH (ref 1.4–6.5)
Neutrophils Relative %: 97 %
Platelets: 336 10*3/uL (ref 150–440)
RBC: 4.64 MIL/uL (ref 3.80–5.20)
RDW: 13.8 % (ref 11.5–14.5)
WBC: 23.4 10*3/uL — ABNORMAL HIGH (ref 3.6–11.0)

## 2018-02-11 LAB — LACTIC ACID, PLASMA: Lactic Acid, Venous: 1.7 mmol/L (ref 0.5–1.9)

## 2018-02-11 LAB — TROPONIN I: Troponin I: <0.03 ng/mL (ref <0.03)

## 2018-02-11 MED ORDER — IPRATROPIUM-ALBUTEROL 0.5-2.5 (3) MG/3ML IN SOLN
3.0000 mL | Freq: Once | RESPIRATORY_TRACT | Status: AC
  Administered 2018-02-11: 3 mL via RESPIRATORY_TRACT
  Filled 2018-02-11: qty 3

## 2018-02-11 MED ORDER — METRONIDAZOLE IN NACL 5-0.79 MG/ML-% IV SOLN
500.0000 mg | Freq: Three times a day (TID) | INTRAVENOUS | Status: DC
  Administered 2018-02-12 – 2018-02-13 (×6): 500 mg via INTRAVENOUS
  Filled 2018-02-11 (×8): qty 100

## 2018-02-11 MED ORDER — SODIUM CHLORIDE 0.9 % IV SOLN
2.0000 g | Freq: Once | INTRAVENOUS | Status: AC
  Administered 2018-02-11: 2 g via INTRAVENOUS
  Filled 2018-02-11: qty 2

## 2018-02-11 MED ORDER — SODIUM CHLORIDE 0.9 % IV BOLUS
1000.0000 mL | Freq: Once | INTRAVENOUS | Status: AC
  Administered 2018-02-11: 1000 mL via INTRAVENOUS

## 2018-02-11 MED ORDER — VANCOMYCIN HCL IN DEXTROSE 1-5 GM/200ML-% IV SOLN
1000.0000 mg | Freq: Once | INTRAVENOUS | Status: AC
  Administered 2018-02-11: 1000 mg via INTRAVENOUS
  Filled 2018-02-11: qty 200

## 2018-02-11 MED ORDER — IOHEXOL 300 MG/ML  SOLN
60.0000 mL | Freq: Once | INTRAMUSCULAR | Status: AC | PRN
  Administered 2018-02-11: 60 mL via INTRAVENOUS

## 2018-02-11 NOTE — ED Notes (Addendum)
Charge nurse notified of pt's WBC, bed requested

## 2018-02-11 NOTE — ED Provider Notes (Signed)
[facility] Emergency Department Provider Note    First MD Initiated Contact with Patient 02/11/18 2108     (approximate)  I have reviewed the triage vital signs and the nursing notes.   HISTORY  Chief Complaint Shortness of Breath and Chest Pain    HPI Christy Carter is a 84 y.o. female with bolus of chronic medical conditions presents to the emergency department with a one-month history of progressive dyspnea. Patient denies any fever but does admit to cough.patient admits to central chest chest tightness. Patient was seen by primary care provider yesterday and advised that this was secondary to reflux and omeprazole was increased and 20-40 mg.  Patient denies any nausea vomiting diarrhea constipation.  Patient denies any abdominal pain.     Past Medical History:  Diagnosis Date  . Arthritis   . Depression   . Hyperlipidemia   . Hypertension   . Shortness of breath   . Stroke ([place])   . Thyroid disease     Patient Active Problem List   Diagnosis Date Noted  . Memory loss, short term 02/10/2018  . Flu vaccine need 02/10/2018  . Chest pain 02/03/2018  . Acute upper respiratory infection 02/03/2018  . Gastroesophageal reflux disease without esophagitis 02/03/2018  . Encounter for general adult medical examination with abnormal findings 01/13/2018  . Chronic low back pain 01/13/2018  . Need for vaccination against Streptococcus pneumoniae using pneumococcal conjugate vaccine 13 01/13/2018  . Dysuria 01/13/2018  . 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 Pelvic pain 10/08/2017  . Gout 06/15/2017  . Mixed hyperlipidemia 06/15/2017  . Inflammatory polyarthropathy ([place]) 06/15/2017  . Neuralgia and neuritis, unspecified 06/15/2017  . Major depressive disorder, recurrent, mild ([place]) 06/15/2017  . Other vitamin B12 deficiency anemias 06/15/2017  . HYPOTHYROIDISM 10/17/2008  . OVERWEIGHT/OBESITY 10/17/2008  . SLEEP APNEA,  OBSTRUCTIVE 10/17/2008  . Essential hypertension 10/17/2008  . GALLSTONES 10/17/2008  . DYSPNEA 10/17/2008  . Overweight 10/17/2008    Past Surgical History:  Procedure Laterality Date  . APPENDECTOMY    . BACK SURGERY    . CHOLECYSTECTOMY    . HERNIA REPAIR      Prior to Admission medications   Medication Sig Start Date End Date Taking? Authorizing Provider  albuterol (PROVENTIL HFA;VENTOLIN HFA) 108 (90 Base) MCG/ACT inhaler Inhale 2 puffs into the lungs every 6 (six) hours as needed for wheezing or shortness of breath. 01/29/18   Ronnell Freshwater, NP  celecoxib (CELEBREX) 200 MG capsule Take 1 capsule (200 mg total) by mouth daily. 02/10/18   Ronnell Freshwater, NP  fluconazole (DIFLUCAN) 150 MG tablet Take one tab a day for 3 days and then once a week Patient not taking: Reported on 01/08/2018 08/04/17   Lavera Guise, MD  gabapentin (NEURONTIN) 100 MG capsule Take 100 mg by mouth 2 (two) times daily.    [provider]  HYDROcodone-acetaminophen (NORCO/VICODIN) 5-325 MG tablet Take 1 tablet by mouth every 4 (four) hours as needed for moderate pain. 01/08/18   Ronnell Freshwater, NP  levothyroxine (SYNTHROID, LEVOTHROID) 100 MCG tablet TAKE 1 TABLET EVERY DAY 09/08/17   Ronnell Freshwater, NP  metroNIDAZOLE (METROGEL) 0.75 % gel Apply 1 application topically every evening. 10/27/17   Lavera Guise, MD  naproxen (NAPROSYN) 500 MG tablet Take 1 tablet (500 mg total) by mouth 2 (two) times daily with a meal. Patient not taking: Reported on 01/08/2018 02/02/15   Johnn Hai, PA-C  omeprazole (PRILOSEC) 40 MG capsule Take 1 capsule (40 mg total) by mouth daily. 01/26/18   Ronnell Freshwater, NP  sertraline (ZOLOFT) 25 MG tablet Take 25 mg by mouth every evening.    [provider]  simvastatin (ZOCOR) 20 MG tablet TAKE 1 TABLET AT BEDTIME 09/08/17   Ronnell Freshwater, NP  traMADol (ULTRAM) 50 MG tablet Take 1 tablet (50 mg total) by mouth every 8 (eight) hours as needed.  08/25/16   Johnn Hai, PA-C  triamterene-hydrochlorothiazide (MAXZIDE-25) 37.5-25 MG per tablet Take 1 tablet by mouth daily.    [provider]  vitamin B-12 (CYANOCOBALAMIN) 500 MCG tablet Take 500 mcg by mouth daily.    [provider]  Vitamin D, Ergocalciferol, (DRISDOL) 50000 units CAPS capsule TAKE ONE CAPSULE BY MOUTH WEEKLY FOR 12 WEEKS 12/11/17   Ronnell Freshwater, NP  vitamin E 200 UNIT capsule Take 200 Units by mouth daily.    [provider]  Zoster Vaccine Adjuvanted [facility]) injection Shingles vaccine series - inject IM as directed . 01/08/18   Ronnell Freshwater, NP  Zoster Vaccine Live, PF, (ZOSTAVAX) 16109 UNT/0.65ML injection Inject 0.65 mLs into the skin once.    [provider]    Allergies no known drug allergies  Family History  Problem Relation Age of Onset  . Heart disease Mother   . Hypertension Mother   . Diverticulitis Mother   . Heart disease Father   . Hypertension Father     Social History Social History   Tobacco Use  . Smoking status: Former Smoker    Types: Cigarettes    Last attempt to quit: 08/15/1992    Years since quitting: 25.5  . Smokeless tobacco: Never Used  Substance Use Topics  . Alcohol use: No  . Drug use: No    Review of Systems Constitutional: No fever/chills Eyes: No visual changes. ENT: No sore throat. Cardiovascular: Denies chest pain. Respiratory: positive for progressive dyspnea Gastrointestinal: No abdominal pain.  No nausea, no vomiting.  No diarrhea.  No constipation. Genitourinary: Negative for dysuria. Musculoskeletal: Negative for neck pain.  Negative for back pain. Integumentary: Negative for rash. Neurological: Negative for headaches, focal weakness or numbness.   ____________________________________________   PHYSICAL EXAM:  VITAL SIGNS: ED Triage Vitals  Enc Vitals Group     BP 02/11/18 2004 (!) 145/50     Pulse Rate 02/11/18 2004 (!) 109     Resp 02/11/18  2004 (!) 22     Temp 02/11/18 2004 98.3 F (36.8 C)     Temp Source 02/11/18 2004 Oral     SpO2 02/11/18 2004 91 %     Weight 02/11/18 2005 103.4 kg (228 lb)     Height 02/11/18 2005 1.702 m (5\' 7" )     Head Circumference --      Peak Flow --      Pain Score 02/11/18 2020 7     Pain Loc --      Pain Edu? --      Excl. in [place]? --     Constitutional: Alert and oriented. apparent respiratory difficulty Eyes: Conjunctivae are normal.  Head: Atraumatic. Ears:  Healthy appearing ear canals and TMs bilaterally Nose: No congestion/rhinnorhea. Mouth/Throat: Mucous membranes are moist.  Oropharynx non-erythematous. Neck: No stridor.  Cardiovascular: Normal rate, regular rhythm. Good peripheral circulation. Grossly normal heart sounds. Respiratory:tachypnea, positive accessory respiratory muscle use, diffuse rhonchi. Gastrointestinal: Soft and nontender. No distention.  Musculoskeletal: No lower extremity tenderness nor  edema. No gross deformities of extremities. Neurologic:  Normal speech and language. No gross focal neurologic deficits are appreciated.  Skin:  Skin is warm, dry and intact. No rash noted. Psychiatric: Mood and affect are normal. Speech and behavior are normal.  ____________________________________________   LABS (all labs ordered are listed, but only abnormal results are displayed)  Labs Reviewed  CBC WITH DIFFERENTIAL/PLATELET - Abnormal; Notable for the following components:      Result Value   WBC 23.4 (*)    Neutro Abs 22.5 (*)    Lymphs Abs 0.3 (*)    All other components within normal limits  COMPREHENSIVE METABOLIC PANEL - Abnormal; Notable for the following components:   Potassium 3.3 (*)    Glucose, Bld 172 (*)    BUN 26 (*)    Creatinine, Ser 1.23 (*)    Calcium 8.8 (*)    GFR calc non Af Amer 40 (*)    GFR calc Af Amer 46 (*)    All other components within normal limits  CULTURE, BLOOD (ROUTINE X 2)  CULTURE, BLOOD (ROUTINE X 2)  TROPONIN I    LACTIC ACID, PLASMA  LACTIC ACID, PLASMA  BLOOD GAS, VENOUS  URINALYSIS, COMPLETE (UACMP) WITH MICROSCOPIC   ____________________________________________  EKG  ED ECG REPORT I, No Name N BROWN, the attending physician, personally viewed and interpreted this ECG.   Date: 02/11/2018  EKG Time: 8:04 PM  Rate: 104  Rhythm: Sinus tachycardia   Axis: Normal  Intervals: Normal  ST&T Change: None  ____________________________________________  RADIOLOGY I, [place] N BROWN, personally viewed and evaluated these images (plain radiographs) as part of my medical decision making, as well as reviewing the written report by the radiologist.  ED MD interpretation: Increased nonspecific interstitial opacities noted on chest x-ray.  Official radiology report(s): Dg Chest 2 View  Result Date: 02/11/2018 CLINICAL DATA:  Shortness of breath and weakness for 1 month. EXAM: CHEST - 2 VIEW COMPARISON:  08/31/2014 and prior chest radiographs. FINDINGS: Cardiomediastinal silhouette is unremarkable and unchanged. Increased interstitial opacities noted which are nonspecific but may represent interstitial edema. Mild elevation of the LEFT hemidiaphragm is again noted. No definite pleural effusion or pneumothorax. IMPRESSION: Increased nonspecific interstitial opacities, may represent interstitial edema. Consider high-resolution chest CT if interstitial lung disease is suspected. Electronically Signed   By: Margarette Canada M.D.   On: 02/11/2018 20:24   Ct Chest W Contrast  Result Date: 02/11/2018 CLINICAL DATA:  Medial chest pain short of breath EXAM: CT CHEST WITH CONTRAST TECHNIQUE: Multidetector CT imaging of the chest was performed during intravenous contrast administration. CONTRAST:  46mL OMNIPAQUE IOHEXOL 300 MG/ML  SOLN COMPARISON:  Chest x-ray 02/11/2017, CT chest 09/08/2015 FINDINGS: Cardiovascular: Nonaneurysmal aorta. Moderate aortic atherosclerosis. Coronary vascular calcification. Normal heart size.  No pneumothorax. Mediastinum/Nodes: Midline trachea. No thyroid mass. Multiple small lymph nodes in the mediastinum. AP window lymph nodes measure up to 11 mm in size. Small hilar nodes measuring up to 13 mm on the right. Esophagus within normal limits. Lungs/Pleura: Atelectasis in the right middle lobe. No focal consolidation or effusion. 7 mm right lower lobe pulmonary nodule. Additional 2-3 mm multiple nodules in the bilateral upper lobes. Peribronchial thickening in the perihilar regions bilaterally. Upper Abdomen: No acute abnormality. Scarring upper pole right kidney. Musculoskeletal: DISH changes of the spine. No acute or suspicious abnormality. IMPRESSION: 1. No focal pulmonary infiltrate is visualized. Mild peribronchial thickening in the perihilar regions, possibly due to central airways inflammatory process. 2. Multiple small pulmonary  nodules including a 7 mm right lower lobe pulmonary nodule. Non-contrast chest CT at 6-12 months is recommended. If the nodule is stable at time of repeat CT, then future CT at 18-24 months (from today's scan) is considered optional for low-risk patients, but is recommended for high-risk patients. This recommendation follows the consensus statement: Guidelines for Management of Incidental Pulmonary Nodules Detected on CT Images: From the Fleischner Society 2017; Radiology 2017; 284:228-243. 3. Multiple small mediastinal and right hilar lymph nodes, nonspecific, could be due to infection, reactive inflammation, lymphoproliferative abnormality. Aortic Atherosclerosis (ICD10-I70.0). Electronically Signed   By: Donavan Foil M.D.   On: 02/11/2018 22:38    Critical Care performed:   ____________________________________________   INITIAL IMPRESSION / ASSESSMENT AND PLAN / ED COURSE  As part of my medical decision making, I reviewed the following data within the electronic MEDICAL RECORD NUMBER   84 year old female presented with above-stated history and physical exam  concerning for possible sepsis given tachypnea tachycardia leukocytosis as such patient received appropriate antibiotic therapy.  Chest x-ray concerning for possible opacities hours CT scan does not show evidence of pneumonia clinical exam patient has crackles and rhonchi diffusely.  Patient discussed with Dr. Jannifer Franklin for hospital admission further evaluation and management.     ____________________________________________  FINAL CLINICAL IMPRESSION(S) / ED DIAGNOSES  Final diagnoses:  Sepsis, due to unspecified organism, unspecified whether acute organ dysfunction present [facility])     MEDICATIONS GIVEN DURING THIS VISIT:  Medications  sodium chloride 0.9 % bolus 1,000 mL (1,000 mLs Intravenous New Bag/Given 02/11/18 2312)  ipratropium-albuterol (DUONEB) 0.5-2.5 (3) MG/3ML nebulizer solution 3 mL (has no administration in time range)  ceFEPIme (MAXIPIME) 2 g in sodium chloride 0.9 % 100 mL IVPB (has no administration in time range)  metroNIDAZOLE (FLAGYL) IVPB 500 mg (has no administration in time range)  vancomycin (VANCOCIN) IVPB 1000 mg/200 mL premix (has no administration in time range)  iohexol (OMNIPAQUE) 300 MG/ML solution 60 mL (60 mLs Intravenous Contrast Given 02/11/18 2210)     ED Discharge Orders    None       Note:  This document was prepared using Dragon voice recognition software and may include unintentional dictation errors.    Gregor Hams, MD 02/12/18 [phone]

## 2018-02-11 NOTE — ED Triage Notes (Signed)
Patient c/o medial chest pain, denies radiation, described as tightness. Patient c/o SOB. Patient c/o weakness.

## 2018-02-11 NOTE — Progress Notes (Signed)
CODE SEPSIS - PHARMACY COMMUNICATION  **Broad Spectrum Antibiotics should be administered within 1 hour of Sepsis diagnosis**  Time Code Sepsis Called/Page Received: [id number]  Antibiotics Ordered: [id number] [id number]  Time of 1st antibiotic administration: [id number]  Additional action taken by pharmacy:   If necessary, Name of Provider/Nurse Contacted:     Eloise Harman ,PharmD Clinical Pharmacist  02/11/2018  11:53 PM

## 2018-02-12 ENCOUNTER — Other Ambulatory Visit: Payer: Self-pay

## 2018-02-12 ENCOUNTER — Ambulatory Visit: Payer: Self-pay | Admitting: Adult Health

## 2018-02-12 DIAGNOSIS — J189 Pneumonia, unspecified organism: Secondary | ICD-10-CM | POA: Diagnosis present

## 2018-02-12 DIAGNOSIS — R0602 Shortness of breath: Secondary | ICD-10-CM | POA: Diagnosis present

## 2018-02-12 DIAGNOSIS — E039 Hypothyroidism, unspecified: Secondary | ICD-10-CM | POA: Diagnosis present

## 2018-02-12 DIAGNOSIS — K219 Gastro-esophageal reflux disease without esophagitis: Secondary | ICD-10-CM | POA: Diagnosis present

## 2018-02-12 DIAGNOSIS — Z87891 Personal history of nicotine dependence: Secondary | ICD-10-CM | POA: Diagnosis not present

## 2018-02-12 DIAGNOSIS — R918 Other nonspecific abnormal finding of lung field: Secondary | ICD-10-CM | POA: Diagnosis not present

## 2018-02-12 DIAGNOSIS — M1990 Unspecified osteoarthritis, unspecified site: Secondary | ICD-10-CM | POA: Diagnosis present

## 2018-02-12 DIAGNOSIS — R911 Solitary pulmonary nodule: Secondary | ICD-10-CM | POA: Diagnosis present

## 2018-02-12 DIAGNOSIS — E876 Hypokalemia: Secondary | ICD-10-CM | POA: Diagnosis present

## 2018-02-12 DIAGNOSIS — Z8673 Personal history of transient ischemic attack (TIA), and cerebral infarction without residual deficits: Secondary | ICD-10-CM | POA: Diagnosis not present

## 2018-02-12 DIAGNOSIS — Z791 Long term (current) use of non-steroidal anti-inflammatories (NSAID): Secondary | ICD-10-CM | POA: Diagnosis not present

## 2018-02-12 DIAGNOSIS — E785 Hyperlipidemia, unspecified: Secondary | ICD-10-CM | POA: Diagnosis present

## 2018-02-12 DIAGNOSIS — A419 Sepsis, unspecified organism: Secondary | ICD-10-CM | POA: Diagnosis present

## 2018-02-12 DIAGNOSIS — N183 Chronic kidney disease, stage 3 (moderate): Secondary | ICD-10-CM | POA: Diagnosis present

## 2018-02-12 DIAGNOSIS — Z8249 Family history of ischemic heart disease and other diseases of the circulatory system: Secondary | ICD-10-CM | POA: Diagnosis not present

## 2018-02-12 DIAGNOSIS — I129 Hypertensive chronic kidney disease with stage 1 through stage 4 chronic kidney disease, or unspecified chronic kidney disease: Secondary | ICD-10-CM | POA: Diagnosis present

## 2018-02-12 DIAGNOSIS — Z79899 Other long term (current) drug therapy: Secondary | ICD-10-CM | POA: Diagnosis not present

## 2018-02-12 DIAGNOSIS — Z79890 Hormone replacement therapy (postmenopausal): Secondary | ICD-10-CM | POA: Diagnosis not present

## 2018-02-12 LAB — BASIC METABOLIC PANEL
Anion gap: 9 (ref 5–15)
BUN: 22 mg/dL (ref 8–23)
CO2: 26 mmol/L (ref 22–32)
Calcium: 8.1 mg/dL — ABNORMAL LOW (ref 8.9–10.3)
Chloride: 104 mmol/L (ref 98–111)
Creatinine, Ser: 1.15 mg/dL — ABNORMAL HIGH (ref 0.44–1.00)
GFR calc Af Amer: 50 mL/min — ABNORMAL LOW (ref >60)
GFR calc non Af Amer: 43 mL/min — ABNORMAL LOW (ref >60)
Glucose, Bld: 150 mg/dL — ABNORMAL HIGH (ref 70–99)
Potassium: 3.2 mmol/L — ABNORMAL LOW (ref 3.5–5.1)
Sodium: 139 mmol/L (ref 135–145)

## 2018-02-12 LAB — URINALYSIS, COMPLETE (UACMP) WITH MICROSCOPIC
Bacteria, UA: NONE SEEN
Bilirubin Urine: NEGATIVE
Glucose, UA: NEGATIVE mg/dL
Hgb urine dipstick: NEGATIVE
Ketones, ur: NEGATIVE mg/dL
Leukocytes, UA: NEGATIVE
Nitrite: NEGATIVE
Protein, ur: NEGATIVE mg/dL
Specific Gravity, Urine: 1.031 — ABNORMAL HIGH (ref 1.005–1.030)
pH: 5 (ref 5.0–8.0)

## 2018-02-12 LAB — CBC
HCT: 39.4 % (ref 35.0–47.0)
Hemoglobin: 13.3 g/dL (ref 12.0–16.0)
MCH: 31.2 pg (ref 26.0–34.0)
MCHC: 33.8 g/dL (ref 32.0–36.0)
MCV: 92.3 fL (ref 80.0–100.0)
Platelets: 314 10*3/uL (ref 150–440)
RBC: 4.27 MIL/uL (ref 3.80–5.20)
RDW: 14.1 % (ref 11.5–14.5)
WBC: 22.6 10*3/uL — ABNORMAL HIGH (ref 3.6–11.0)

## 2018-02-12 LAB — PROCALCITONIN: Procalcitonin: 4 ng/mL

## 2018-02-12 LAB — BRAIN NATRIURETIC PEPTIDE: B Natriuretic Peptide: 118 pg/mL — ABNORMAL HIGH (ref 0.0–100.0)

## 2018-02-12 LAB — MRSA PCR SCREENING: MRSA by PCR: NEGATIVE

## 2018-02-12 MED ORDER — ALUM & MAG HYDROXIDE-SIMETH 200-200-20 MG/5ML PO SUSP
30.0000 mL | Freq: Four times a day (QID) | ORAL | Status: DC | PRN
  Administered 2018-02-12 (×2): 30 mL via ORAL
  Filled 2018-02-12 (×2): qty 30

## 2018-02-12 MED ORDER — ONDANSETRON HCL 4 MG/2ML IJ SOLN: 4.0000 mg | Freq: Four times a day (QID) | INTRAMUSCULAR | Status: DC | PRN

## 2018-02-12 MED ORDER — ACETAMINOPHEN 650 MG RE SUPP: 650.0000 mg | Freq: Four times a day (QID) | RECTAL | Status: DC | PRN

## 2018-02-12 MED ORDER — ONDANSETRON HCL 4 MG PO TABS: 4.0000 mg | ORAL_TABLET | Freq: Four times a day (QID) | ORAL | Status: DC | PRN

## 2018-02-12 MED ORDER — PNEUMOCOCCAL VAC POLYVALENT 25 MCG/0.5ML IJ INJ: 0.5000 mL | INJECTION | INTRAMUSCULAR | Status: DC

## 2018-02-12 MED ORDER — SODIUM CHLORIDE 0.9 % IV SOLN
INTRAVENOUS | Status: DC | PRN
  Administered 2018-02-12 – 2018-02-13 (×3): 1000 mL via INTRAVENOUS
  Administered 2018-02-14: 500 mL via INTRAVENOUS

## 2018-02-12 MED ORDER — VANCOMYCIN HCL IN DEXTROSE 1-5 GM/200ML-% IV SOLN
1000.0000 mg | INTRAVENOUS | Status: DC
  Administered 2018-02-12: 1000 mg via INTRAVENOUS
  Filled 2018-02-12 (×2): qty 200

## 2018-02-12 MED ORDER — PANTOPRAZOLE SODIUM 40 MG PO TBEC
40.0000 mg | DELAYED_RELEASE_TABLET | Freq: Every day | ORAL | Status: DC
  Administered 2018-02-12 – 2018-02-14 (×3): 40 mg via ORAL
  Filled 2018-02-12 (×2): qty 1

## 2018-02-12 MED ORDER — SODIUM CHLORIDE 0.9 % IV SOLN
2.0000 g | Freq: Two times a day (BID) | INTRAVENOUS | Status: DC
  Administered 2018-02-12 – 2018-02-13 (×3): 2 g via INTRAVENOUS
  Filled 2018-02-12 (×5): qty 2

## 2018-02-12 MED ORDER — HYDROCODONE-ACETAMINOPHEN 5-325 MG PO TABS
1.0000 | ORAL_TABLET | ORAL | Status: DC | PRN
  Administered 2018-02-12 (×2): 1 via ORAL
  Filled 2018-02-12 (×2): qty 1

## 2018-02-12 MED ORDER — ENOXAPARIN SODIUM 40 MG/0.4ML ~~LOC~~ SOLN
40.0000 mg | SUBCUTANEOUS | Status: DC
  Administered 2018-02-12 – 2018-02-13 (×2): 40 mg via SUBCUTANEOUS
  Filled 2018-02-12 (×2): qty 0.4

## 2018-02-12 MED ORDER — IPRATROPIUM-ALBUTEROL 0.5-2.5 (3) MG/3ML IN SOLN: 3.0000 mL | RESPIRATORY_TRACT | Status: DC | PRN

## 2018-02-12 MED ORDER — LEVOTHYROXINE SODIUM 100 MCG PO TABS
100.0000 ug | ORAL_TABLET | Freq: Every day | ORAL | Status: DC
  Administered 2018-02-12 – 2018-02-14 (×3): 100 ug via ORAL
  Filled 2018-02-12 (×3): qty 1

## 2018-02-12 MED ORDER — ACETAMINOPHEN 325 MG PO TABS: 650.0000 mg | ORAL_TABLET | Freq: Four times a day (QID) | ORAL | Status: DC | PRN

## 2018-02-12 NOTE — H&P (Signed)
[facility] NAME: Christy Carter    MR#:  [id number]  DATE OF BIRTH:  Dec 24, 1933  DATE OF ADMISSION:  02/11/2018  PRIMARY CARE PHYSICIAN: Lavera Guise, MD   REQUESTING/REFERRING PHYSICIAN: Owens Shark, MD  CHIEF COMPLAINT:   Chief Complaint  Patient presents with  . Shortness of Breath  . Chest Pain    HISTORY OF PRESENT ILLNESS:  Christy Carter  is a 84 y.o. female who presents with chief complaint as above.  Patient presents to the ED with complaint of shortness of breath, chills, and chest tenderness.  She states that she first started feeling bad a couple weeks ago, when she felt like she had a cold.  Over the last week or so she has been feeling progressively worse, especially over the last 2 to 3 days.  She saw her physician who felt like she had some increased reflux and doubled her antacid medication.  Patient states this did not help.  A couple of nights ago she woke up very short of breath.  Today she had significant shaking chills and was very short of breath with pleuritic chest pain.  She came to ED for evaluation and was found to have pneumonia, and met sepsis criteria.  Hospitalist were called for admission  PAST MEDICAL HISTORY:   Past Medical History:  Diagnosis Date  . Arthritis   . Depression   . Hyperlipidemia   . Hypertension   . Shortness of breath   . Stroke ([facility])   . Thyroid disease      PAST SURGICAL HISTORY:   Past Surgical History:  Procedure Laterality Date  . APPENDECTOMY    . BACK SURGERY    . CHOLECYSTECTOMY    . HERNIA REPAIR       SOCIAL HISTORY:   Social History   Tobacco Use  . Smoking status: Former Smoker    Types: Cigarettes    Last attempt to quit: 08/15/1992    Years since quitting: 25.5  . Smokeless tobacco: Never Used  Substance Use Topics  . Alcohol use: No     FAMILY HISTORY:   Family History  Problem Relation Age of Onset  . Heart disease Mother   .  Hypertension Mother   . Diverticulitis Mother   . Heart disease Father   . Hypertension Father      DRUG ALLERGIES:  No Known Allergies  MEDICATIONS AT HOME:   Prior to Admission medications   Medication Sig Start Date End Date Taking? Authorizing Provider  albuterol (PROVENTIL HFA;VENTOLIN HFA) 108 (90 Base) MCG/ACT inhaler Inhale 2 puffs into the lungs every 6 (six) hours as needed for wheezing or shortness of breath. 01/29/18  Yes Boscia, Greer Ee, NP  celecoxib (CELEBREX) 200 MG capsule Take 1 capsule (200 mg total) by mouth daily. 02/10/18  Yes Boscia, Greer Ee, NP  HYDROcodone-acetaminophen (NORCO/VICODIN) 5-325 MG tablet Take 1 tablet by mouth every 4 (four) hours as needed for moderate pain. 01/08/18  Yes Boscia, Greer Ee, NP  ibuprofen (ADVIL,MOTRIN) 200 MG tablet Take 200 mg by mouth 2 (two) times daily.   Yes [provider]  levothyroxine (SYNTHROID, LEVOTHROID) 100 MCG tablet TAKE 1 TABLET EVERY DAY Patient taking differently: Take 100 mcg by mouth daily before breakfast.  09/08/17  Yes Boscia, Heather E, NP  omeprazole (PRILOSEC) 40 MG capsule Take 1 capsule (40 mg total) by mouth daily. 01/26/18  Yes Ronnell Freshwater, NP  triamterene-hydrochlorothiazide (MAXZIDE-25) 37.5-25  MG per tablet Take 1 tablet by mouth daily.   Yes [provider]  vitamin B-12 (CYANOCOBALAMIN) 500 MCG tablet Take 500 mcg by mouth daily.   Yes [provider]  Vitamin D, Ergocalciferol, (DRISDOL) 50000 units CAPS capsule TAKE ONE CAPSULE BY MOUTH WEEKLY FOR 12 WEEKS Patient taking differently: Take 50,000 Units by mouth every 7 (seven) days.  12/11/17  Yes Ronnell Freshwater, NP  simvastatin (ZOCOR) 20 MG tablet TAKE 1 TABLET AT BEDTIME Patient not taking: Reported on 02/11/2018 09/08/17   Ronnell Freshwater, NP  Zoster Vaccine Adjuvanted [facility]) injection Shingles vaccine series - inject IM as directed . 01/08/18   Ronnell Freshwater, NP    REVIEW OF SYSTEMS:  Review of  Systems  Constitutional: Positive for chills and malaise/fatigue. Negative for fever and weight loss.  HENT: Negative for ear pain, hearing loss and tinnitus.   Eyes: Negative for blurred vision, double vision, pain and redness.  Respiratory: Positive for shortness of breath. Negative for cough and hemoptysis.   Cardiovascular: Negative for chest pain, palpitations, orthopnea and leg swelling.  Gastrointestinal: Negative for abdominal pain, constipation, diarrhea, nausea and vomiting.  Genitourinary: Negative for dysuria, frequency and hematuria.  Musculoskeletal: Negative for back pain, joint pain and neck pain.  Skin:       No acne, rash, or lesions  Neurological: Negative for dizziness, tremors, focal weakness and weakness.  Endo/Heme/Allergies: Negative for polydipsia. Does not bruise/bleed easily.  Psychiatric/Behavioral: Negative for depression. The patient is not nervous/anxious and does not have insomnia.      VITAL SIGNS:   Vitals:   02/11/18 2130 02/11/18 2300 02/11/18 2351 02/12/18 0029  BP: (!) 126/54 (!) 141/55 (!) 144/70 (!) 106/46  Pulse: 96 94 92 (!) 102  Resp: (!) 25 (!) 22 18 (!) 24  Temp:      TempSrc:      SpO2: 91% 95% 97% 95%  Weight:      Height:       Wt Readings from Last 3 Encounters:  02/11/18 103.4 kg  02/10/18 103.9 kg  01/26/18 108.5 kg    PHYSICAL EXAMINATION:  Physical Exam  Vitals reviewed. Constitutional: She is oriented to person, place, and time. She appears well-developed and well-nourished. No distress.  HENT:  Head: Normocephalic and atraumatic.  Mouth/Throat: Oropharynx is clear and moist.  Eyes: Pupils are equal, round, and reactive to light. Conjunctivae and EOM are normal. No scleral icterus.  Neck: Normal range of motion. Neck supple. No JVD present. No thyromegaly present.  Cardiovascular: Regular rhythm and intact distal pulses. Exam reveals no gallop and no friction rub.  No murmur heard. Borderline tachycardic   Respiratory: Effort normal. No respiratory distress. She has no wheezes. She has rales (Bilateral, coarse).  GI: Soft. Bowel sounds are normal. She exhibits no distension. There is no tenderness.  Musculoskeletal: Normal range of motion. She exhibits no edema.  No arthritis, no gout  Lymphadenopathy:    She has no cervical adenopathy.  Neurological: She is alert and oriented to person, place, and time. No cranial nerve deficit.  No dysarthria, no aphasia  Skin: Skin is warm and dry. No rash noted. No erythema.  Psychiatric: She has a normal mood and affect. Her behavior is normal. Judgment and thought content normal.    LABORATORY PANEL:   CBC Recent Labs  Lab 02/11/18 2006  WBC 23.4*  HGB 14.4  HCT 41.6  PLT 336   ------------------------------------------------------------------------------------------------------------------  Chemistries  Recent Labs  Lab  02/11/18 2006  NA 138  K 3.3*  CL 103  CO2 24  GLUCOSE 172*  BUN 26*  CREATININE 1.23*  CALCIUM 8.8*  AST 21  ALT 13  ALKPHOS 85  BILITOT 0.6   ------------------------------------------------------------------------------------------------------------------  Cardiac Enzymes Recent Labs  Lab 02/11/18 2006  TROPONINI <0.03   ------------------------------------------------------------------------------------------------------------------  RADIOLOGY:  Dg Chest 2 View  Result Date: 02/11/2018 CLINICAL DATA:  Shortness of breath and weakness for 1 month. EXAM: CHEST - 2 VIEW COMPARISON:  08/31/2014 and prior chest radiographs. FINDINGS: Cardiomediastinal silhouette is unremarkable and unchanged. Increased interstitial opacities noted which are nonspecific but may represent interstitial edema. Mild elevation of the LEFT hemidiaphragm is again noted. No definite pleural effusion or pneumothorax. IMPRESSION: Increased nonspecific interstitial opacities, may represent interstitial edema. Consider high-resolution  chest CT if interstitial lung disease is suspected. Electronically Signed   By: Margarette Canada M.D.   On: 02/11/2018 20:24   Ct Chest W Contrast  Result Date: 02/11/2018 CLINICAL DATA:  Medial chest pain short of breath EXAM: CT CHEST WITH CONTRAST TECHNIQUE: Multidetector CT imaging of the chest was performed during intravenous contrast administration. CONTRAST:  52m OMNIPAQUE IOHEXOL 300 MG/ML  SOLN COMPARISON:  Chest x-ray 02/11/2017, CT chest 09/08/2015 FINDINGS: Cardiovascular: Nonaneurysmal aorta. Moderate aortic atherosclerosis. Coronary vascular calcification. Normal heart size. No pneumothorax. Mediastinum/Nodes: Midline trachea. No thyroid mass. Multiple small lymph nodes in the mediastinum. AP window lymph nodes measure up to 11 mm in size. Small hilar nodes measuring up to 13 mm on the right. Esophagus within normal limits. Lungs/Pleura: Atelectasis in the right middle lobe. No focal consolidation or effusion. 7 mm right lower lobe pulmonary nodule. Additional 2-3 mm multiple nodules in the bilateral upper lobes. Peribronchial thickening in the perihilar regions bilaterally. Upper Abdomen: No acute abnormality. Scarring upper pole right kidney. Musculoskeletal: DISH changes of the spine. No acute or suspicious abnormality. IMPRESSION: 1. No focal pulmonary infiltrate is visualized. Mild peribronchial thickening in the perihilar regions, possibly due to central airways inflammatory process. 2. Multiple small pulmonary nodules including a 7 mm right lower lobe pulmonary nodule. Non-contrast chest CT at 6-12 months is recommended. If the nodule is stable at time of repeat CT, then future CT at 18-24 months (from today's scan) is considered optional for low-risk patients, but is recommended for high-risk patients. This recommendation follows the consensus statement: Guidelines for Management of Incidental Pulmonary Nodules Detected on CT Images: From the Fleischner Society 2017; Radiology 2017;  284:228-243. 3. Multiple small mediastinal and right hilar lymph nodes, nonspecific, could be due to infection, reactive inflammation, lymphoproliferative abnormality. Aortic Atherosclerosis (ICD10-I70.0). Electronically Signed   By: KDonavan FoilM.D.   On: 02/11/2018 22:38    EKG:   Orders placed or performed during the hospital encounter of 02/11/18  . ED EKG  . ED EKG  . EKG 12-Lead  . EKG 12-Lead    IMPRESSION AND PLAN:  Principal Problem:   Sepsis ([place] -IV antibiotics given, lactic acid within normal limits, blood pressure stable, sepsis is due to pneumonia, culture sent Active Problems:   CAP (community acquired pneumonia) -IV antibiotics as above, other supportive treatment PRN   CKD (chronic kidney disease), stage III ([place] -stable, avoid nephrotoxins and monitor   Essential hypertension -continue home meds   Hypothyroidism -home dose thyroid replacement   Gastroesophageal reflux disease without esophagitis -home dose PPI  Chart review performed and case discussed with ED provider. Labs, imaging and/or ECG reviewed by provider and discussed with patient/family. Management plans  discussed with the patient and/or family.  DVT PROPHYLAXIS: SubQ lovenox   GI PROPHYLAXIS:  PPI   ADMISSION STATUS: Inpatient     CODE STATUS: Full Advance Directive Documentation     Most Recent Value  Type of Advance Directive  Healthcare Power of Attorney, Living will  Pre-existing out of facility DNR order (yellow form or pink MOST form)  -  "MOST" Form in Place?  -      TOTAL TIME TAKING CARE OF THIS PATIENT: 45 minutes.   Jannifer Franklin, Vernadine Coombs Glandorf 02/12/2018, 12:45 AM  [facility]  Office  [phone]  CC: Primary care physician; Lavera Guise, MD  Note:  This document was prepared using Dragon voice recognition software and may include unintentional dictation errors.

## 2018-02-12 NOTE — ED Notes (Signed)
2 unsuccessful attempts to collect 2nd set of blood cultures. One set labeled and sent to lab; antibiotics started at this time.

## 2018-02-12 NOTE — Progress Notes (Signed)
Pharmacy Antibiotic Note  Christy Carter is a 84 y.o. female admitted on 02/11/2018 with unknown source.  Pharmacy has been consulted for vancomycin and cefepime dosing.  Plan: DW 78kg  Vd 55L kei 0.041 hr-1  T1/2 17 hours Vancomycin 1 gram q 18 hours ordered with stacked dosing. Level before 5th dose. Goal trough 15-20  Cefepime 2 grams q 12 hours ordered  Height: 5\' 7"  (170.2 cm) Weight: 228 lb (103.4 kg) IBW/kg (Calculated) : 61.6  Temp (24hrs), Avg:98.3 F (36.8 C), Min:98.3 F (36.8 C), Max:98.3 F (36.8 C)  Recent Labs  Lab 02/11/18 2006 02/11/18 2310  WBC 23.4*  --   CREATININE 1.23*  --   LATICACIDVEN  --  1.7    Estimated Creatinine Clearance: 43.6 mL/min (A) (by C-G formula based on SCr of 1.23 mg/dL (H)).    No Known Allergies  Antimicrobials this admission: Vancomycin, cefepime 10/3  >>    >>   Dose adjustments this admission:   Microbiology results: 10/3 BCx: pending      10/3 CXR: increased interstitial opacities 10/3 UA: pending Thank you for allowing pharmacy to be a part of this patient's care.  Kyia Rhude S 02/12/2018 12:05 AM

## 2018-02-12 NOTE — Progress Notes (Signed)
Pt complains of discomfort in mid chest. Pt says feels like indigestion. Standing Maalox orders will be entered and dose will be given. I will continue to assess.

## 2018-02-13 DIAGNOSIS — R918 Other nonspecific abnormal finding of lung field: Secondary | ICD-10-CM

## 2018-02-13 DIAGNOSIS — K219 Gastro-esophageal reflux disease without esophagitis: Secondary | ICD-10-CM

## 2018-02-13 DIAGNOSIS — N183 Chronic kidney disease, stage 3 (moderate): Secondary | ICD-10-CM

## 2018-02-13 DIAGNOSIS — J189 Pneumonia, unspecified organism: Secondary | ICD-10-CM

## 2018-02-13 LAB — CBC
HCT: 41.7 % (ref 35.0–47.0)
Hemoglobin: 13.5 g/dL (ref 12.0–16.0)
MCH: 29.7 pg (ref 26.0–34.0)
MCHC: 32.4 g/dL (ref 32.0–36.0)
MCV: 91.7 fL (ref 80.0–100.0)
Platelets: 327 10*3/uL (ref 150–440)
RBC: 4.54 MIL/uL (ref 3.80–5.20)
RDW: 14.2 % (ref 11.5–14.5)
WBC: 10.5 10*3/uL (ref 3.6–11.0)

## 2018-02-13 LAB — PROCALCITONIN: Procalcitonin: 1.38 ng/mL

## 2018-02-13 LAB — MAGNESIUM: Magnesium: 2.1 mg/dL (ref 1.7–2.4)

## 2018-02-13 MED ORDER — AZITHROMYCIN 250 MG PO TABS
500.0000 mg | ORAL_TABLET | Freq: Every day | ORAL | Status: DC
  Administered 2018-02-14: 500 mg via ORAL
  Filled 2018-02-13: qty 2

## 2018-02-13 MED ORDER — SODIUM CHLORIDE 0.9 % IV SOLN
1.0000 g | INTRAVENOUS | Status: DC
  Administered 2018-02-14: 1 g via INTRAVENOUS
  Filled 2018-02-13: qty 1
  Filled 2018-02-13: qty 10

## 2018-02-13 MED ORDER — POTASSIUM CHLORIDE CRYS ER 20 MEQ PO TBCR
40.0000 meq | EXTENDED_RELEASE_TABLET | Freq: Once | ORAL | Status: AC
  Administered 2018-02-13: 40 meq via ORAL
  Filled 2018-02-13: qty 2

## 2018-02-13 MED ORDER — GUAIFENESIN-DM 100-10 MG/5ML PO SYRP: 5.0000 mL | ORAL_SOLUTION | ORAL | Status: DC | PRN

## 2018-02-13 NOTE — Progress Notes (Signed)
Advanced Care Plan.  Purpose of Encounter: CODE STATUS. Parties in Attendance: The patient and me. Patient's Decisional Capacity: Yes. Medical Story: Christy Carter  is a 84 y.o. female  with history of hypertension, hyperlipidemia, stroke and arthritis etc. presented to the ED with complaint of shortness of breath, chills, and chest tenderness.  She is admitted for Sepsis due to pneumonia, CAP.  CT of the chest show pulmonary nodules.  I discussed with the patient about current condition, prognosis and CODE STATUS.  She want to be resuscitated and intubated if necessary but she does not want to put on ventilator long time. Plan:  Code Status: Full code. Time spent discussing advance care planning: 17 minutes.

## 2018-02-13 NOTE — Consult Note (Signed)
Pulmonary Critical Care  Initial Consult Note  Christy Carter EUM:[id number] D DOB: [birth year] DOA: 02/11/2018 Referring physician: Dr Christeen Douglas  Chief Complaint: SOB Pulm Nodule  HPI: Christy Carter is a 84 y.o. female admitted for possible pneumonia.  The patient was being followed in the office had been having some complaints of tightness and pain in her chest.  She was treated for what appeared to be an upper respiratory tract infection by her primary care provider.  Patient was given Augmentin as an outpatient.  On the last visit which was October 2 in the office she states that she was feeling better.  She had been complaining about having bad taste in her mouth.  She had just completed course of Augmentin for approximately 10 days.  Should be noted that her primary care provider has noted that she has been having increasing forgetfulness.  She is had difficulty remembering patient's names and details about television shows.  She was also given a flu vaccination at the time of the last visit.  Patient presented then the following day in the emergency room with complaining of shortness of breath as well as chest pain.  The patient at that time told the admitting physician that she was given double her dose of the antireflux medications.  Does not appear that the patient mention anything about the Augmentin.  She had been also complaining about having shaking and chills.  Chest x-ray shows nonspecific interstitial changes and reduction in lung volumes which is really unchanged from her previous film that I personally reviewed dating back to 2016 except for the interstitial changes.  Diagnosis of interstitial pneumonitis was made at that time and patient was admitted to the hospital.  Patient had a CT scan of the chest done which shows multiple small pulmonary nodules with a single 7 mm right lower lobe nodule and a repeat scan was recommended in 6 to 12 months.  I have asked to see the patient for evaluation  of the pulmonary nodules.  It should be noted on the CT scan however that there are no focal pulmonary infiltrates other than some peribronchial thickening which would suggest more of a chronic inflammatory process.  Review of Systems:  12 point ROS performed and is unremarkable other than noted in HPI  Past Medical History:  Diagnosis Date  . Arthritis   . Depression   . Hyperlipidemia   . Hypertension   . Shortness of breath   . Stroke ([place])   . Thyroid disease    Past Surgical History:  Procedure Laterality Date  . APPENDECTOMY    . BACK SURGERY    . CHOLECYSTECTOMY    . HERNIA REPAIR     Social History:  reports that she quit smoking about 25 years ago. Her smoking use included cigarettes. She has never used smokeless tobacco. She reports that she does not drink alcohol or use drugs.  No Known Allergies  Family History  Problem Relation Age of Onset  . Heart disease Mother   . Hypertension Mother   . Diverticulitis Mother   . Heart disease Father   . Hypertension Father     Prior to Admission medications   Medication Sig Start Date End Date Taking? Authorizing Provider  albuterol (PROVENTIL HFA;VENTOLIN HFA) 108 (90 Base) MCG/ACT inhaler Inhale 2 puffs into the lungs every 6 (six) hours as needed for wheezing or shortness of breath. 01/29/18  Yes Ronnell Freshwater, NP  celecoxib (CELEBREX) 200 MG capsule Take 1  capsule (200 mg total) by mouth daily. 02/10/18  Yes Boscia, Greer Ee, NP  HYDROcodone-acetaminophen (NORCO/VICODIN) 5-325 MG tablet Take 1 tablet by mouth every 4 (four) hours as needed for moderate pain. 01/08/18  Yes [place]ia, Greer Ee, NP  ibuprofen (ADVIL,MOTRIN) 200 MG tablet Take 200 mg by mouth 2 (two) times daily.   Yes [provider]  levothyroxine (SYNTHROID, LEVOTHROID) 100 MCG tablet TAKE 1 TABLET EVERY DAY Patient taking differently: Take 100 mcg by mouth daily before breakfast.  09/08/17  Yes Boscia, Heather E, NP  omeprazole (PRILOSEC) 40 MG  capsule Take 1 capsule (40 mg total) by mouth daily. 01/26/18  Yes Boscia, Greer Ee, NP  triamterene-hydrochlorothiazide (MAXZIDE-25) 37.5-25 MG per tablet Take 1 tablet by mouth daily.   Yes [provider]  vitamin B-12 (CYANOCOBALAMIN) 500 MCG tablet Take 500 mcg by mouth daily.   Yes [provider]  Vitamin D, Ergocalciferol, (DRISDOL) 50000 units CAPS capsule TAKE ONE CAPSULE BY MOUTH WEEKLY FOR 12 WEEKS Patient taking differently: Take 50,000 Units by mouth every 7 (seven) days.  12/11/17  Yes Ronnell Freshwater, NP  simvastatin (ZOCOR) 20 MG tablet TAKE 1 TABLET AT BEDTIME Patient not taking: Reported on 02/11/2018 09/08/17   Ronnell Freshwater, NP  Zoster Vaccine Adjuvanted [facility]) injection Shingles vaccine series - inject IM as directed . 01/08/18   Ronnell Freshwater, NP   Physical Exam: Vitals:   02/12/18 2114 02/13/18 0410 02/13/18 0822 02/13/18 1646  BP: (!) 123/52 105/85 (!) 136/53 (!) 112/44  Pulse: 84 87 82 67  Resp:      Temp:  98.5 F (36.9 C) 98 F (36.7 C) 97.9 F (36.6 C)  TempSrc:  Oral Oral Oral  SpO2: 95% 90% 91% 95%  Weight:  99.8 kg    Height:        Wt Readings from Last 3 Encounters:  02/13/18 99.8 kg  02/10/18 103.9 kg  01/26/18 108.5 kg    General:  Appears calm and comfortable Eyes: PERRL, normal lids, irises & conjunctiva ENT: grossly normal hearing, lips & tongue Neck: no LAD, masses or thyromegaly Cardiovascular: RRR, no m/r/g. No LE edema. Respiratory: CTA bilaterally, no w/r/r.       Normal respiratory effort. Abdomen: soft, nontender Skin: no rash or induration seen on limited exam Musculoskeletal: grossly normal tone BUE/BLE Psychiatric: grossly normal mood and affect Neurologic: grossly non-focal.          Labs on Admission:  Basic Metabolic Panel: Recent Labs  Lab 02/11/18 2006 02/12/18 0218 02/13/18 0350  NA 138 139  --   K 3.3* 3.2*  --   CL 103 104  --   CO2 24 26  --   GLUCOSE 172* 150*  --   BUN 26*  22  --   CREATININE 1.23* 1.15*  --   CALCIUM 8.8* 8.1*  --   MG  --   --  2.1   Liver Function Tests: Recent Labs  Lab 02/11/18 2006  AST 21  ALT 13  ALKPHOS 85  BILITOT 0.6  PROT 7.4  ALBUMIN 3.8   No results for input(s): LIPASE, AMYLASE in the last 168 hours. No results for input(s): AMMONIA in the last 168 hours. CBC: Recent Labs  Lab 02/11/18 2006 02/12/18 0218 02/13/18 0350  WBC 23.4* 22.6* 10.5  NEUTROABS 22.5*  --   --   HGB 14.4 13.3 13.5  HCT 41.6 39.4 41.7  MCV 89.6 92.3 91.7  PLT 336 314 327  Cardiac Enzymes: Recent Labs  Lab 02/11/18 2006  TROPONINI <0.03    BNP (last 3 results) Recent Labs    02/11/18 2006  BNP 118.0*    ProBNP (last 3 results) No results for input(s): PROBNP in the last 8760 hours.  CBG: No results for input(s): GLUCAP in the last 168 hours.  Radiological Exams on Admission: Dg Chest 2 View  Result Date: 02/11/2018 CLINICAL DATA:  Shortness of breath and weakness for 1 month. EXAM: CHEST - 2 VIEW COMPARISON:  08/31/2014 and prior chest radiographs. FINDINGS: Cardiomediastinal silhouette is unremarkable and unchanged. Increased interstitial opacities noted which are nonspecific but may represent interstitial edema. Mild elevation of the LEFT hemidiaphragm is again noted. No definite pleural effusion or pneumothorax. IMPRESSION: Increased nonspecific interstitial opacities, may represent interstitial edema. Consider high-resolution chest CT if interstitial lung disease is suspected. Electronically Signed   By: Margarette Canada M.D.   On: 02/11/2018 20:24   Ct Chest W Contrast  Result Date: 02/11/2018 CLINICAL DATA:  Medial chest pain short of breath EXAM: CT CHEST WITH CONTRAST TECHNIQUE: Multidetector CT imaging of the chest was performed during intravenous contrast administration. CONTRAST:  61mL OMNIPAQUE IOHEXOL 300 MG/ML  SOLN COMPARISON:  Chest x-ray 02/11/2017, CT chest 09/08/2015 FINDINGS: Cardiovascular: Nonaneurysmal  aorta. Moderate aortic atherosclerosis. Coronary vascular calcification. Normal heart size. No pneumothorax. Mediastinum/Nodes: Midline trachea. No thyroid mass. Multiple small lymph nodes in the mediastinum. AP window lymph nodes measure up to 11 mm in size. Small hilar nodes measuring up to 13 mm on the right. Esophagus within normal limits. Lungs/Pleura: Atelectasis in the right middle lobe. No focal consolidation or effusion. 7 mm right lower lobe pulmonary nodule. Additional 2-3 mm multiple nodules in the bilateral upper lobes. Peribronchial thickening in the perihilar regions bilaterally. Upper Abdomen: No acute abnormality. Scarring upper pole right kidney. Musculoskeletal: DISH changes of the spine. No acute or suspicious abnormality. IMPRESSION: 1. No focal pulmonary infiltrate is visualized. Mild peribronchial thickening in the perihilar regions, possibly due to central airways inflammatory process. 2. Multiple small pulmonary nodules including a 7 mm right lower lobe pulmonary nodule. Non-contrast chest CT at 6-12 months is recommended. If the nodule is stable at time of repeat CT, then future CT at 18-24 months (from today's scan) is considered optional for low-risk patients, but is recommended for high-risk patients. This recommendation follows the consensus statement: Guidelines for Management of Incidental Pulmonary Nodules Detected on CT Images: From the Fleischner Society 2017; Radiology 2017; 284:228-243. 3. Multiple small mediastinal and right hilar lymph nodes, nonspecific, could be due to infection, reactive inflammation, lymphoproliferative abnormality. Aortic Atherosclerosis (ICD10-I70.0). Electronically Signed   By: Donavan Foil M.D.   On: 02/11/2018 22:38    EKG: Independently reviewed.  Assessment/Plan Principal Problem:   Sepsis ([place]) Active Problems:   Hypothyroidism   Essential hypertension   Gastroesophageal reflux disease without esophagitis   CAP (community acquired  pneumonia)   CKD (chronic kidney disease), stage III ([place])   1. Pulmonary nodules multiple nodules are presented at this time.  They are subcentimeter nodules at this time and therefore no further invasive testing can be performed.  It is reasonable however to continue to follow these nodules around 6 to 12 months.  She can come back in the office and we can continue to monitor them. 2. Possible community-acquired pneumonia the CT scan does not clearly show a pneumonitis or infectious process at this time.  I would consider discontinuing IV cefepime and changing her to azithromycin as  atypical coverage is not been provided.  Patient was already treated with Augmentin as an outpatient. 3. Chronic kidney disease stage III stable at this time we will continue to monitor. 4. GERD would continue with present management  Patient should follow-up with me in the office after she is discharged for further follow-up  Code Status: Full code  Family Communication: None Disposition Plan: Home  Time spent: 70 minutes  I have personally obtained a history, examined the patient, evaluated laboratory and imaging results, formulated the assessment and plan and placed orders.  The Patient requires high complexity decision making for assessment and support. Total Time Spent 1min   Saadat A Khan, MD [facility] Pulmonary Critical Care Medicine Sleep Medicine

## 2018-02-13 NOTE — Progress Notes (Addendum)
[place] [facility] [place] NAME: Christy Carter    MR#:  [id number]  DATE OF BIRTH:  November 02, 1933  SUBJECTIVE:  CHIEF COMPLAINT:   Chief Complaint  Patient presents with  . Shortness of Breath  . Chest Pain   Chest pain while eating.  The patient denies any history of GERD.  She was given Maalox. REVIEW OF SYSTEMS:  Review of Systems  Constitutional: Negative for chills, fever and malaise/fatigue.  HENT: Negative for sore throat.   Eyes: Negative for blurred vision and double vision.  Respiratory: Negative for cough, hemoptysis, shortness of breath, wheezing and stridor.   Cardiovascular: Positive for chest pain. Negative for palpitations, orthopnea and leg swelling.  Gastrointestinal: Negative for abdominal pain, blood in stool, diarrhea, melena, nausea and vomiting.  Genitourinary: Negative for dysuria, flank pain and hematuria.  Musculoskeletal: Negative for back pain and joint pain.  Skin: Negative for rash.  Neurological: Negative for dizziness, sensory change, focal weakness, seizures, loss of consciousness, weakness and headaches.  Endo/Heme/Allergies: Negative for polydipsia.  Psychiatric/Behavioral: Negative for depression. The patient is not nervous/anxious.     DRUG ALLERGIES:  No Known Allergies VITALS:  Blood pressure (!) 136/53, pulse 82, temperature 98 F (36.7 C), temperature source Oral, resp. rate 16, height 5\' 5"  (1.651 m), weight 99.8 kg, SpO2 91 %. PHYSICAL EXAMINATION:  Physical Exam  Constitutional: She is oriented to person, place, and time. She appears well-nourished. No distress.  HENT:  Head: Normocephalic.  Mouth/Throat: Oropharynx is clear and moist.  Eyes: Pupils are equal, round, and reactive to light. Conjunctivae and EOM are normal. No scleral icterus.  Neck: Normal range of motion. Neck supple. No JVD present. No tracheal deviation present.  Cardiovascular: Normal rate, regular rhythm and normal heart  sounds. Exam reveals no gallop.  No murmur heard. Pulmonary/Chest: Effort normal. No respiratory distress. She has no wheezes. She has no rales.  Some crackles.  Abdominal: Soft. Bowel sounds are normal. She exhibits no distension. There is no tenderness. There is no rebound.  Musculoskeletal: Normal range of motion. She exhibits no edema or tenderness.  Neurological: She is alert and oriented to person, place, and time. No cranial nerve deficit.  Skin: No rash noted. No erythema.  Psychiatric: She has a normal mood and affect.   LABORATORY PANEL:  Female CBC Recent Labs  Lab 02/13/18 0350  WBC 10.5  HGB 13.5  HCT 41.7  PLT 327   ------------------------------------------------------------------------------------------------------------------ Chemistries  Recent Labs  Lab 02/11/18 2006 02/12/18 0218 02/13/18 0350  NA 138 139  --   K 3.3* 3.2*  --   CL 103 104  --   CO2 24 26  --   GLUCOSE 172* 150*  --   BUN 26* 22  --   CREATININE 1.23* 1.15*  --   CALCIUM 8.8* 8.1*  --   MG  --   --  2.1  AST 21  --   --   ALT 13  --   --   ALKPHOS 85  --   --   BILITOT 0.6  --   --    RADIOLOGY:  No results found. ASSESSMENT AND PLAN:   Sepsis due to pneumonia, CAP. The patient has been treated with cefepime IV. I will change to Zithromax and Rocephin since it is atypical CAP.  Robitussin as needed.   Blood cultures negative so far.  Leukocytosis improved.  Hypokalemia.  Given potassium and follow-up level.  CKD (chronic kidney disease), stage III ([facility]) -stable, avoid nephrotoxins and monitor   Essential hypertension -continue home meds   Hypothyroidism -home dose thyroid replacement   Gastroesophageal reflux disease without esophagitis -home dose PPI  Multiple small pulmonary nodules including a 7 mm right lower lobe pulmonary nodule. Non-contrast chest CT at 6-12 months is recommended.  Follow-up with Dr. Humphrey Rolls as outpatient.  All the records are reviewed and case  discussed with Care Management/Social Worker. Management plans discussed with the patient, family and they are in agreement.  CODE STATUS: Full Code  TOTAL TIME TAKING CARE OF THIS PATIENT: 25 minutes.   More than 50% of the time was spent in counseling/coordination of care: YES  POSSIBLE D/C IN 2 DAYS, DEPENDING ON CLINICAL CONDITION.   Demetrios Loll M.D on 02/13/2018 at 4:41 PM  Between 7am to 6pm - Pager - [phone]  After 6pm go to [email or web address] - [facility]

## 2018-02-13 NOTE — Progress Notes (Signed)
Admitted for sepsis, community-acquired pneumonia, labs, medications.  Agree with present treatment patient says that she feels better than yesterday, continue antibiotics, antacids, likely discharge home tomorrow if she continues to do well.  Physical exam did not reveal any wheezing on auscultation of the lungs. Time spent 10 minutes

## 2018-02-14 LAB — PROCALCITONIN: Procalcitonin: 0.64 ng/mL

## 2018-02-14 LAB — BASIC METABOLIC PANEL
Anion gap: 5 (ref 5–15)
BUN: 23 mg/dL (ref 8–23)
CO2: 27 mmol/L (ref 22–32)
Calcium: 8.2 mg/dL — ABNORMAL LOW (ref 8.9–10.3)
Chloride: 109 mmol/L (ref 98–111)
Creatinine, Ser: 0.91 mg/dL (ref 0.44–1.00)
GFR calc Af Amer: >60 mL/min (ref >60)
GFR calc non Af Amer: 57 mL/min — ABNORMAL LOW (ref >60)
Glucose, Bld: 94 mg/dL (ref 70–99)
Potassium: 3.9 mmol/L (ref 3.5–5.1)
Sodium: 141 mmol/L (ref 135–145)

## 2018-02-14 MED ORDER — AZITHROMYCIN 500 MG PO TABS: 500.0000 mg | ORAL_TABLET | Freq: Every day | ORAL | 0 refills | Status: DC

## 2018-02-14 MED ORDER — GUAIFENESIN-DM 100-10 MG/5ML PO SYRP: 5.0000 mL | ORAL_SOLUTION | ORAL | 0 refills | Status: DC | PRN

## 2018-02-14 NOTE — Progress Notes (Signed)
Christy Carter to be D/C'd Home per MD order.  Discussed prescriptions and follow up appointments with the patient. Prescriptions given to patient, medication list explained in detail. Pt verbalized understanding. Son at bedside  Allergies as of 02/14/2018   No Known Allergies     Medication List    STOP taking these medications   ibuprofen 200 MG tablet Commonly known as:  ADVIL,MOTRIN   Zoster Vaccine Adjuvanted injection Commonly known as:  [place] these medications   albuterol 108 (90 Base) MCG/ACT inhaler Commonly known as:  PROVENTIL HFA;VENTOLIN HFA Inhale 2 puffs into the lungs every 6 (six) hours as needed for wheezing or shortness of breath.   azithromycin 500 MG tablet Commonly known as:  ZITHROMAX Take 1 tablet (500 mg total) by mouth daily.   celecoxib 200 MG capsule Commonly known as:  CELEBREX Take 1 capsule (200 mg total) by mouth daily.   guaiFENesin-dextromethorphan 100-10 MG/5ML syrup Commonly known as:  ROBITUSSIN DM Take 5 mLs by mouth every 4 (four) hours as needed for cough.   HYDROcodone-acetaminophen 5-325 MG tablet Commonly known as:  NORCO/VICODIN Take 1 tablet by mouth every 4 (four) hours as needed for moderate pain.   levothyroxine 100 MCG tablet Commonly known as:  SYNTHROID, LEVOTHROID TAKE 1 TABLET EVERY DAY What changed:  when to take this   omeprazole 40 MG capsule Commonly known as:  PRILOSEC Take 1 capsule (40 mg total) by mouth daily.   simvastatin 20 MG tablet Commonly known as:  ZOCOR TAKE 1 TABLET AT BEDTIME   triamterene-hydrochlorothiazide 37.5-25 MG tablet Commonly known as:  MAXZIDE-25 Take 1 tablet by mouth daily.   vitamin B-12 500 MCG tablet Commonly known as:  CYANOCOBALAMIN Take 500 mcg by mouth daily.   Vitamin D (Ergocalciferol) 50000 units Caps capsule Commonly known as:  DRISDOL TAKE ONE CAPSULE BY MOUTH WEEKLY FOR 12 WEEKS What changed:  See the new instructions.       Vitals:   02/14/18  0523 02/14/18 0747  BP: (!) 145/63 (!) 138/54  Pulse: 74 65  Resp: 17 18  Temp: 98.1 F (36.7 C) (!) 97.5 F (36.4 C)  SpO2: 92% 94%    Tele box removed and returned. Skin clean, dry and intact without evidence of skin break down, no evidence of skin tears noted. IV catheter discontinued intact. Site without signs and symptoms of complications. Dressing and pressure applied. Pt denies pain at this time. No complaints noted.  An After Visit Summary was printed and given to the patient. Patient escorted via [place], and D/C home via private auto.  [name]

## 2018-02-14 NOTE — Discharge Instructions (Signed)
Multiple small pulmonary nodules including a 7 mm right lower lobe pulmonary nodule. Non-contrast chest CT at 6-12 months is recommended.  Fall precaution.

## 2018-02-14 NOTE — Discharge Summary (Signed)
[facility] [place] NAME: Christy Carter    MR#:  [id number]  DATE OF BIRTH:  1934-05-01  DATE OF ADMISSION:  02/11/2018   ADMITTING PHYSICIAN: Harrie Foreman, MD  DATE OF DISCHARGE: 02/14/2018 PRIMARY CARE PHYSICIAN: Lavera Guise, MD   ADMISSION DIAGNOSIS:  Sepsis, due to unspecified organism, unspecified whether acute organ dysfunction present ([place]) [A41.9] DISCHARGE DIAGNOSIS:  Principal Problem:   Sepsis ([place]) Active Problems:   Hypothyroidism   Essential hypertension   Gastroesophageal reflux disease without esophagitis   CAP (community acquired pneumonia)   CKD (chronic kidney disease), stage III ([place])  SECONDARY DIAGNOSIS:   Past Medical History:  Diagnosis Date  . Arthritis   . Depression   . Hyperlipidemia   . Hypertension   . Shortness of breath   . Stroke ([place])   . Thyroid disease    HOSPITAL COURSE:  Sepsis due to pneumonia, CAP. The patient has been treated with cefepime IV. Changeed to Zithromax and Rocephin since it is atypical CAP.  Robitussin as needed.  continue Zithromax p.o. for 5 more days after discharge. Blood cultures negative so far.  Leukocytosis improved.  Hypokalemia.  Given potassium and improved.  CKD (chronic kidney disease), stage III ([facility]) -stable, avoid nephrotoxins and monitor Essential hypertension -continue home meds Hypothyroidism -home dose thyroid replacement Gastroesophageal reflux disease without esophagitis -home dose PPI  Multiple small pulmonary nodules including a 7 mm right lower lobe pulmonary nodule. Non-contrast chest CT at 6-12 months is recommended.  Follow-up with Dr. Humphrey Rolls as outpatient. The patient needs home health but she refused. DISCHARGE CONDITIONS:  Stable, discharge to home today. CONSULTS OBTAINED:  Treatment Team:  Allyne Gee, MD DRUG ALLERGIES:  No Known Allergies DISCHARGE MEDICATIONS:   Allergies as of 02/14/2018   No Known  Allergies     Medication List    STOP taking these medications   ibuprofen 200 MG tablet Commonly known as:  ADVIL,MOTRIN   Zoster Vaccine Adjuvanted injection Commonly known as:  SHINGRIX     TAKE these medications   albuterol 108 (90 Base) MCG/ACT inhaler Commonly known as:  PROVENTIL HFA;VENTOLIN HFA Inhale 2 puffs into the lungs every 6 (six) hours as needed for wheezing or shortness of breath.   azithromycin 500 MG tablet Commonly known as:  ZITHROMAX Take 1 tablet (500 mg total) by mouth daily.   celecoxib 200 MG capsule Commonly known as:  CELEBREX Take 1 capsule (200 mg total) by mouth daily.   guaiFENesin-dextromethorphan 100-10 MG/5ML syrup Commonly known as:  ROBITUSSIN DM Take 5 mLs by mouth every 4 (four) hours as needed for cough.   HYDROcodone-acetaminophen 5-325 MG tablet Commonly known as:  NORCO/VICODIN Take 1 tablet by mouth every 4 (four) hours as needed for moderate pain.   levothyroxine 100 MCG tablet Commonly known as:  SYNTHROID, LEVOTHROID TAKE 1 TABLET EVERY DAY What changed:  when to take this   omeprazole 40 MG capsule Commonly known as:  PRILOSEC Take 1 capsule (40 mg total) by mouth daily.   simvastatin 20 MG tablet Commonly known as:  ZOCOR TAKE 1 TABLET AT BEDTIME   triamterene-hydrochlorothiazide 37.5-25 MG tablet Commonly known as:  MAXZIDE-25 Take 1 tablet by mouth daily.   vitamin B-12 500 MCG tablet Commonly known as:  CYANOCOBALAMIN Take 500 mcg by mouth daily.   Vitamin D (Ergocalciferol) 50000 units Caps capsule Commonly known as:  DRISDOL TAKE ONE CAPSULE BY MOUTH WEEKLY FOR 12 WEEKS  What changed:  See the new instructions.        DISCHARGE INSTRUCTIONS:  See AVS. If you experience worsening of your admission symptoms, develop shortness of breath, life threatening emergency, suicidal or homicidal thoughts you must seek medical attention immediately by calling 911 or calling your MD immediately  if symptoms less  severe.  You Must read complete instructions/literature along with all the possible adverse reactions/side effects for all the Medicines you take and that have been prescribed to you. Take any new Medicines after you have completely understood and accpet all the possible adverse reactions/side effects.   Please note  You were cared for by a hospitalist during your hospital stay. If you have any questions about your discharge medications or the care you received while you were in the hospital after you are discharged, you can call the unit and asked to speak with the hospitalist on call if the hospitalist that took care of you is not available. Once you are discharged, your primary care physician will handle any further medical issues. Please note that NO REFILLS for any discharge medications will be authorized once you are discharged, as it is imperative that you return to your primary care physician (or establish a relationship with a primary care physician if you do not have one) for your aftercare needs so that they can reassess your need for medications and monitor your lab values.    On the day of Discharge:  VITAL SIGNS:  Blood pressure (!) 138/54, pulse 65, temperature (!) 97.5 F (36.4 C), temperature source Oral, resp. rate 18, height 5\' 5"  (1.651 m), weight 99.8 kg, SpO2 94 %. PHYSICAL EXAMINATION:  GENERAL:  84 y.o.-year-old patient lying in the bed with no acute distress.  EYES: Pupils equal, round, reactive to light and accommodation. No scleral icterus. Extraocular muscles intact.  HEENT: Head atraumatic, normocephalic. Oropharynx and nasopharynx clear.  NECK:  Supple, no jugular venous distention. No thyroid enlargement, no tenderness.  LUNGS: Normal breath sounds bilaterally, no wheezing, rales,rhonchi or crepitation. No use of accessory muscles of respiration.  CARDIOVASCULAR: S1, S2 normal. No murmurs, rubs, or gallops.  ABDOMEN: Soft, non-tender, non-distended. Bowel sounds  present. No organomegaly or mass.  EXTREMITIES: No pedal edema, cyanosis, or clubbing.  NEUROLOGIC: Cranial nerves II through XII are intact. Muscle strength 5/5 in all extremities. Sensation intact. Gait not checked.  PSYCHIATRIC: The patient is alert and oriented x 3.  SKIN: No obvious rash, lesion, or ulcer.  DATA REVIEW:   CBC Recent Labs  Lab 02/13/18 0350  WBC 10.5  HGB 13.5  HCT 41.7  PLT 327    Chemistries  Recent Labs  Lab 02/11/18 2006  02/13/18 0350 02/14/18 0417  NA 138   < >  --  141  K 3.3*   < >  --  3.9  CL 103   < >  --  109  CO2 24   < >  --  27  GLUCOSE 172*   < >  --  94  BUN 26*   < >  --  23  CREATININE 1.23*   < >  --  0.91  CALCIUM 8.8*   < >  --  8.2*  MG  --   --  2.1  --   AST 21  --   --   --   ALT 13  --   --   --   ALKPHOS 85  --   --   --  BILITOT 0.6  --   --   --    < > = values in this interval not displayed.     Microbiology Results  Results for orders placed or performed during the hospital encounter of 02/11/18  Blood Culture (routine x 2)     Status: None (Preliminary result)   Collection Time: 02/12/18  2:17 AM  Result Value Ref Range Status   Specimen Description BLOOD RIGHT ANTECUBITAL  Final   Special Requests   Final    BOTTLES DRAWN AEROBIC AND ANAEROBIC Blood Culture results may not be optimal due to an excessive volume of blood received in culture bottles   Culture   Final    NO GROWTH 2 DAYS Performed at [facility], [address] [place], [place] [address]    Report Status PENDING  Incomplete  Blood Culture (routine x 2)     Status: None (Preliminary result)   Collection Time: 02/12/18  2:18 AM  Result Value Ref Range Status   Specimen Description BLOOD RIGHT ARM  Final   Special Requests   Final    BOTTLES DRAWN AEROBIC AND ANAEROBIC Blood Culture results may not be optimal due to an inadequate volume of blood received in culture bottles   Culture   Final    NO GROWTH 2 DAYS Performed at  [facility], [address] [place], [place] [address]    Report Status PENDING  Incomplete  MRSA PCR Screening     Status: None   Collection Time: 02/12/18  1:16 PM  Result Value Ref Range Status   MRSA by PCR NEGATIVE NEGATIVE Final    Comment:        The GeneXpert MRSA Assay (FDA approved for NASAL specimens only), is one component of a comprehensive MRSA colonization surveillance program. It is not intended to diagnose MRSA infection nor to guide or monitor treatment for MRSA infections. Performed at [facility], [address] [place], [place] [address]     RADIOLOGY:  No results found.   Management plans discussed with the patient, family and they are in agreement.  CODE STATUS: Full Code   TOTAL TIME TAKING CARE OF THIS PATIENT: 32 minutes.    Demetrios Loll M.D on 02/14/2018 at 12:53 PM  Between 7am to 6pm - Pager - [phone]  After 6pm go to [email or web address] - password [email or web address] [facility]  [employer] [place] [facility]  Office  [phone]  CC: Primary care physician; Lavera Guise, MD   Note: This dictation was prepared with Dragon dictation along with smaller phrase technology. Any transcriptional errors that result from this process are unintentional.

## 2018-02-16 ENCOUNTER — Other Ambulatory Visit: Payer: Self-pay

## 2018-02-16 NOTE — Patient Outreach (Addendum)
[facility] [place] [facility]) Care Management  02/16/2018  Christy Carter Dec 11, 1933 [id number]   TELEPHONE SCREENING Referral date: 02/16/18 Referral source: [facility] tier 4  Referral reason: high risk screening Insurance: [employer] Attempt #1   Telephone call to patient regarding referral. Unable to reach patient. HIPAA compliant voice message left with call back phone number.   PLAN: RNCM will attempt 2nd telephone call to patient within 4 business days. RNCM will send outreach letter.   Quinn Plowman RN,BSN, [employer] Telephonic  [phone]

## 2018-02-17 LAB — CULTURE, BLOOD (ROUTINE X 2)
Culture: NO GROWTH
Culture: NO GROWTH

## 2018-02-18 ENCOUNTER — Other Ambulatory Visit: Payer: Self-pay

## 2018-02-18 NOTE — Patient Outreach (Signed)
[facility] [place] [facility]) Care Management  02/18/2018  Christy Carter 05-19-1933 [id number]  TELEPHONE SCREENING Referral date: 02/16/18 Referral source: [facility] referral Insurance: [facility]  Transition of care will be completed by patient's primary care provider office  Telephone call to patient regarding [facility] referral. HIPAA verified with patient. Explained reason for call.  RNCM discussed and offered [facility] care management services. Patient states she was discharged from the hospital on 10/ 6/19.  Patient reports she had Pneumonia, sepsis, and MRSA.  Patient states she is scheduled to follow up with a lung specialist on Monday, 02/22/18 to discuss test results.  She states she had a CT scan done while in the hospital.  Patient states she feels she is ok for now.  She denies having any home care services. Patient states she receives her medication from [facility] order. She states she is taking her medicines as prescribed. Patient states she has transportation to her appointments. RNCM offered to send patient [facility] care management brochure/ magnet.  Patient verbally agreed.   PLAN;  RNCM will close patient due to refusal of services.  RNCM will send patient [facility] care management brochure / magnet RNCM will send patients primary MD closure notification   Quinn Plowman RN,BSN,CCM [facility] Telephonic  [phone]

## 2018-02-22 ENCOUNTER — Ambulatory Visit: Payer: Medicare HMO | Admitting: Internal Medicine

## 2018-02-22 ENCOUNTER — Encounter: Payer: Self-pay | Admitting: Internal Medicine

## 2018-02-22 VITALS — BP 154/74 | HR 73 | Temp 98.3°F | Resp 18 | Ht 67.0 in | Wt 243.0 lb

## 2018-02-22 DIAGNOSIS — G4733 Obstructive sleep apnea (adult) (pediatric): Secondary | ICD-10-CM | POA: Diagnosis not present

## 2018-02-22 DIAGNOSIS — R911 Solitary pulmonary nodule: Secondary | ICD-10-CM | POA: Diagnosis not present

## 2018-02-22 DIAGNOSIS — N183 Chronic kidney disease, stage 3 unspecified: Secondary | ICD-10-CM

## 2018-02-22 DIAGNOSIS — K219 Gastro-esophageal reflux disease without esophagitis: Secondary | ICD-10-CM | POA: Diagnosis not present

## 2018-02-22 NOTE — Progress Notes (Signed)
[facility] [place], [place] [address]  Pulmonary Sleep Medicine   Office Visit Note  Patient Name: Christy Carter DOB: 1933-06-17 MRN [id number]  Date of Service: 02/22/2018  Complaints/HPI: Pt here for hospital follow up.  She was admitted to [facility] with sepsis, and community acquired pneumonia.  She was admitted for 3 days during she was treated for the pneumonia. Dr. Humphrey Rolls was consult on patient due to multiple left lower lobe lung nodules that were found on CAT scan.  She is seen in this today as follow-up for this.  These findings were discussed with patient she does questions at this time.  Will repeat CT in 6 months to evaluate nodules further.  At this juncture she denies chest pain, cough, shortness of breath, fever or chills.  ROS  General: (-) fever, (-) chills, (-) night sweats, (-) weakness Skin: (-) rashes, (-) itching,. Eyes: (-) visual changes, (-) redness, (-) itching. Nose and Sinuses: (-) nasal stuffiness or itchiness, (-) postnasal drip, (-) nosebleeds, (-) sinus trouble. Mouth and Throat: (-) sore throat, (-) hoarseness. Neck: (-) swollen glands, (-) enlarged thyroid, (-) neck pain. Respiratory: - cough, (-) bloody sputum, + shortness of breath, - wheezing. Cardiovascular: - ankle swelling, (-) chest pain. Lymphatic: (-) lymph node enlargement. Neurologic: (-) numbness, (-) tingling. Psychiatric: (-) anxiety, (-) depression   Current Medication: Outpatient Encounter Medications as of 02/22/2018  Medication Sig  . albuterol (PROVENTIL HFA;VENTOLIN HFA) 108 (90 Base) MCG/ACT inhaler Inhale 2 puffs into the lungs every 6 (six) hours as needed for wheezing or shortness of breath.  . celecoxib (CELEBREX) 200 MG capsule Take 1 capsule (200 mg total) by mouth daily.  Marland Kitchen guaiFENesin-dextromethorphan (ROBITUSSIN DM) 100-10 MG/5ML syrup Take 5 mLs by mouth every 4 (four) hours as needed for cough.  Marland Kitchen HYDROcodone-acetaminophen (NORCO/VICODIN) 5-325  MG tablet Take 1 tablet by mouth every 4 (four) hours as needed for moderate pain.  Marland Kitchen levothyroxine (SYNTHROID, LEVOTHROID) 100 MCG tablet TAKE 1 TABLET EVERY DAY (Patient taking differently: Take 100 mcg by mouth daily before breakfast. )  . omeprazole (PRILOSEC) 40 MG capsule Take 1 capsule (40 mg total) by mouth daily.  . simvastatin (ZOCOR) 20 MG tablet TAKE 1 TABLET AT BEDTIME  . triamterene-hydrochlorothiazide (MAXZIDE-25) 37.5-25 MG per tablet Take 1 tablet by mouth daily.  . vitamin B-12 (CYANOCOBALAMIN) 500 MCG tablet Take 500 mcg by mouth daily.  . Vitamin D, Ergocalciferol, (DRISDOL) 50000 units CAPS capsule TAKE ONE CAPSULE BY MOUTH WEEKLY FOR 12 WEEKS (Patient taking differently: Take 50,000 Units by mouth every 7 (seven) days. )  . azithromycin (ZITHROMAX) 500 MG tablet Take 1 tablet (500 mg total) by mouth daily. (Patient not taking: Reported on 02/22/2018)   No facility-administered encounter medications on file as of 02/22/2018.     Surgical History: Past Surgical History:  Procedure Laterality Date  . APPENDECTOMY    . BACK SURGERY    . CHOLECYSTECTOMY    . HERNIA REPAIR      Medical History: Past Medical History:  Diagnosis Date  . Arthritis   . Depression   . Hyperlipidemia   . Hypertension   . Shortness of breath   . Stroke ([place])   . Thyroid disease     Family History: Family History  Problem Relation Age of Onset  . Heart disease Mother   . Hypertension Mother   . Diverticulitis Mother   . Heart disease Father   . Hypertension Father  Social History: Social History   Socioeconomic History  . Marital status: Widowed    Spouse name: Not on file  . Number of children: Not on file  . Years of education: Not on file  . Highest education level: Not on file  Occupational History  . Not on file  Social Needs  . Financial resource strain: Somewhat hard  . Food insecurity:    Worry: Sometimes true    Inability: Sometimes true  . Transportation  needs:    Medical: No    Non-medical: No  Tobacco Use  . Smoking status: Former Smoker    Types: Cigarettes    Last attempt to quit: 08/15/1992    Years since quitting: 25.5  . Smokeless tobacco: Never Used  Substance and Sexual Activity  . Alcohol use: No  . Drug use: No  . Sexual activity: Not on file  Lifestyle  . Physical activity:    Days per week: 0 days    Minutes per session: 0 min  . Stress: Only a little  Relationships  . Social connections:    Talks on phone: More than three times a week    Gets together: More than three times a week    Attends religious service: More than 4 times per year    Active member of club or organization: Yes    Attends meetings of clubs or organizations: 1 to 4 times per year    Relationship status: Widowed  . Intimate partner violence:    Fear of current or ex partner: No    Emotionally abused: No    Physically abused: No    Forced sexual activity: No  Other Topics Concern  . Not on file  Social History Narrative  . Not on file    Vital Signs: Blood pressure (!) 154/74, pulse 73, temperature 98.3 F (36.8 C), resp. rate 18, height 5' 7" (1.702 m), weight 243 lb (110.2 kg), SpO2 93 %.  Examination: General Appearance: The patient is well-developed, well-nourished, and in no distress. Skin: Gross inspection of skin unremarkable. Head: normocephalic, no gross deformities. Eyes: no gross deformities noted. ENT: ears appear grossly normal no exudates. Neck: Supple. No thyromegaly. No LAD. Respiratory: Clear to auscultation bilateraly. Cardiovascular: Normal S1 and S2 without murmur or rub. Extremities: No cyanosis. pulses are equal. Neurologic: Alert and oriented. No involuntary movements.  LABS: Recent Results (from the past 2160 hour(s))  Iron, TIBC and Ferritin Panel     Status: None   Collection Time: 12/15/17 10:39 AM  Result Value Ref Range   Total Iron Binding Capacity 285 250 - 450 ug/dL   UIBC 235 118 - 369 ug/dL    Iron 50 27 - 139 ug/dL   Iron Saturation 18 15 - 55 %   Ferritin 70 15 - 150 ng/mL  CBC with Differential/Platelet     Status: None   Collection Time: 12/15/17 10:39 AM  Result Value Ref Range   WBC 5.8 3.4 - 10.8 x10E3/uL   RBC 4.91 3.77 - 5.28 x10E6/uL   Hemoglobin 15.2 11.1 - 15.9 g/dL   Hematocrit 43.4 34.0 - 46.6 %   MCV 88 79 - 97 fL   MCH 31.0 26.6 - 33.0 pg   MCHC 35.0 31.5 - 35.7 g/dL   RDW 13.9 12.3 - 15.4 %   Platelets 254 150 - 450 x10E3/uL   Neutrophils 52 Not Estab. %   Lymphs 32 Not Estab. %   Monocytes 10 Not Estab. %   Eos   4 Not Estab. %   Basos 1 Not Estab. %   Neutrophils Absolute 3.0 1.4 - 7.0 x10E3/uL   Lymphocytes Absolute 1.9 0.7 - 3.1 x10E3/uL   Monocytes Absolute 0.6 0.1 - 0.9 x10E3/uL   EOS (ABSOLUTE) 0.3 0.0 - 0.4 x10E3/uL   Basophils Absolute 0.1 0.0 - 0.2 x10E3/uL   Immature Granulocytes 1 Not Estab. %   Immature Grans (Abs) 0.0 0.0 - 0.1 x10E3/uL  Comprehensive metabolic panel     Status: Abnormal   Collection Time: 12/15/17 10:39 AM  Result Value Ref Range   Glucose 87 65 - 99 mg/dL   BUN 21 8 - 27 mg/dL   Creatinine, Ser 1.09 (H) 0.57 - 1.00 mg/dL   GFR calc non Af Amer 47 (L) >59 mL/min/1.73   GFR calc Af Amer 55 (L) >59 mL/min/1.73   BUN/Creatinine Ratio 19 12 - 28   Sodium 142 134 - 144 mmol/L   Potassium 4.5 3.5 - 5.2 mmol/L   Chloride 102 96 - 106 mmol/L   CO2 27 20 - 29 mmol/L   Calcium 9.2 8.7 - 10.3 mg/dL   Total Protein 6.6 6.0 - 8.5 g/dL   Albumin 4.3 3.5 - 4.7 g/dL   Globulin, Total 2.3 1.5 - 4.5 g/dL   Albumin/Globulin Ratio 1.9 1.2 - 2.2   Bilirubin Total 0.4 0.0 - 1.2 mg/dL   Alkaline Phosphatase 109 39 - 117 IU/L   AST 14 0 - 40 IU/L   ALT 8 0 - 32 IU/L  B12 and Folate Panel     Status: Abnormal   Collection Time: 12/15/17 10:39 AM  Result Value Ref Range   Vitamin B-12 >2000 (H) 232 - 1245 pg/mL   Folate 15.2 >3.0 ng/mL    Comment: A serum folate concentration of less than 3.1 ng/mL is considered to represent  clinical deficiency.   T4, free     Status: None   Collection Time: 12/15/17 10:39 AM  Result Value Ref Range   Free T4 1.32 0.82 - 1.77 ng/dL  TSH     Status: None   Collection Time: 12/15/17 10:39 AM  Result Value Ref Range   TSH 1.710 0.450 - 4.500 uIU/mL  VITAMIN D 25 Hydroxy (Vit-D Deficiency, Fractures)     Status: Abnormal   Collection Time: 12/15/17 10:39 AM  Result Value Ref Range   Vit D, 25-Hydroxy 27.8 (L) 30.0 - 100.0 ng/mL    Comment: Vitamin D deficiency has been defined by the Institute of Medicine and an Endocrine Society practice guideline as a level of serum 25-OH vitamin D less than 20 ng/mL (1,2). The Endocrine Society went on to further define vitamin D insufficiency as a level between 21 and 29 ng/mL (2). 1. IOM (Institute of Medicine). 2010. Dietary reference    intakes for calcium and D. [place]: The    National Academies Press. 2. Holick MF, Binkley [place], Bischoff-Ferrari HA, et al.    Evaluation, treatment, and prevention of vitamin D    deficiency: an Endocrine Society clinical practice    guideline. JCEM. 2011 Jul; 96(7):1911-30.   UA/M w/rflx Culture, Routine     Status: Abnormal   Collection Time: 01/08/18 12:00 AM  Result Value Ref Range   Specific Gravity, UA 1.007 1.005 - 1.030   pH, UA 5.5 5.0 - 7.5   Color, UA Yellow Yellow   Appearance Ur Clear Clear   Leukocytes, UA 1+ (A) Negative   Protein, UA Negative Negative/Trace   Glucose, UA Negative   Negative   Ketones, UA Negative Negative   RBC, UA Negative Negative   Bilirubin, UA Negative Negative   Urobilinogen, Ur 0.2 0.2 - 1.0 mg/dL   Nitrite, UA Positive (A) Negative   Microscopic Examination See below:     Comment: Microscopic was indicated and was performed.   Urinalysis Reflex Comment     Comment: This specimen has reflexed to a Urine Culture.  Microscopic Examination     Status: Abnormal   Collection Time: 01/08/18 12:00 AM  Result Value Ref Range   WBC, UA 6-10 (A) 0 - 5  /hpf   RBC, UA 0-2 0 - 2 /hpf   Epithelial Cells (non renal) 0-10 0 - 10 /hpf   Casts None seen None seen /lpf   Mucus, UA Present Not Estab.   Bacteria, UA Few None seen/Few  Urine Culture, Reflex     Status: Abnormal   Collection Time: 01/08/18 12:00 AM  Result Value Ref Range   Urine Culture, Routine Final report (A)    Organism ID, Bacteria Escherichia coli (A)     Comment: Greater than 100,000 colony forming units per mL Cefazolin <=4 ug/mL Cefazolin with an MIC <=16 predicts susceptibility to the oral agents cefaclor, cefdinir, cefpodoxime, cefprozil, cefuroxime, cephalexin, and loracarbef when used for therapy of uncomplicated urinary tract infections due to E. coli, Klebsiella pneumoniae, and Proteus mirabilis.    Antimicrobial Susceptibility Comment     Comment:       ** S = Susceptible; I = Intermediate; R = Resistant **                    P = Positive; N = Negative             MICS are expressed in micrograms per mL    Antibiotic                 RSLT#1    RSLT#2    RSLT#3    RSLT#4 Amoxicillin/Clavulanic Acid    S Ampicillin                     S Cefepime                       S Ceftriaxone                    S Cefuroxime                     S Ciprofloxacin                  S Ertapenem                      S Gentamicin                     S Imipenem                       S Levofloxacin                   S Meropenem                      S Nitrofurantoin                 S Piperacillin/Tazobactam        S Tetracycline  S Tobramycin                     S Trimethoprim/Sulfa             S   Specimen status report     Status: None   Collection Time: 01/08/18 12:00 AM  Result Value Ref Range   specimen status report Comment     Comment: Please note Please note The date and/or time of collection was not indicated on the requisition as required by state and federal law.  The date of receipt of the specimen was used as the collection date if  not supplied.   POCT Urinalysis Dipstick     Status: Abnormal   Collection Time: 01/26/18  2:39 PM  Result Value Ref Range   Color, UA     Clarity, UA     Glucose, UA Negative Negative   Bilirubin, UA Negative    Ketones, UA Negative    Spec Grav, UA 1.010 1.010 - 1.025   Blood, UA neg    pH, UA 5.0 5.0 - 8.0   Protein, UA Positive (A) Negative   Urobilinogen, UA 0.2 0.2 or 1.0 E.U./dL   Nitrite, UA     Leukocytes, UA Negative Negative   Appearance Negative    Odor    CBC with Differential     Status: Abnormal   Collection Time: 02/11/18  8:06 PM  Result Value Ref Range   WBC 23.4 (H) 3.6 - 11.0 K/uL   RBC 4.64 3.80 - 5.20 MIL/uL   Hemoglobin 14.4 12.0 - 16.0 g/dL   HCT 41.6 35.0 - 47.0 %   MCV 89.6 80.0 - 100.0 fL   MCH 31.0 26.0 - 34.0 pg   MCHC 34.6 32.0 - 36.0 g/dL   RDW 13.8 11.5 - 14.5 %   Platelets 336 150 - 440 K/uL   Neutrophils Relative % 97 %   Neutro Abs 22.5 (H) 1.4 - 6.5 K/uL   Lymphocytes Relative 1 %   Lymphs Abs 0.3 (L) 1.0 - 3.6 K/uL   Monocytes Relative 2 %   Monocytes Absolute 0.6 0.2 - 0.9 K/uL   Eosinophils Relative 0 %   Eosinophils Absolute 0.0 0 - 0.7 K/uL   Basophils Relative 0 %   Basophils Absolute 0.0 0 - 0.1 K/uL    Comment: Performed at [facility], [address] [place], [place] [address]  Comprehensive metabolic panel     Status: Abnormal   Collection Time: 02/11/18  8:06 PM  Result Value Ref Range   Sodium 138 135 - 145 mmol/L   Potassium 3.3 (L) 3.5 - 5.1 mmol/L   Chloride 103 98 - 111 mmol/L   CO2 24 22 - 32 mmol/L   Glucose, Bld 172 (H) 70 - 99 mg/dL   BUN 26 (H) 8 - 23 mg/dL   Creatinine, Ser 1.23 (H) 0.44 - 1.00 mg/dL   Calcium 8.8 (L) 8.9 - 10.3 mg/dL   Total Protein 7.4 6.5 - 8.1 g/dL   Albumin 3.8 3.5 - 5.0 g/dL   AST 21 15 - 41 U/L   ALT 13 0 - 44 U/L   Alkaline Phosphatase 85 38 - 126 U/L   Total Bilirubin 0.6 0.3 - 1.2 mg/dL   GFR calc non Af Amer 40 (L) >60 mL/min   GFR calc Af Amer 46 (L) >60  mL/min    Comment: (NOTE) The eGFR has been calculated using the CKD EPI equation. This   calculation has not been validated in all clinical situations. eGFR's persistently <60 mL/min signify possible Chronic Kidney Disease.    Anion gap 11 5 - 15    Comment: Performed at [facility], [address] [place], [place] [address]  Troponin I     Status: None   Collection Time: 02/11/18  8:06 PM  Result Value Ref Range   Troponin I <0.03 <0.03 ng/mL    Comment: Performed at [facility], [address] [place], [place] [address]  Brain natriuretic peptide     Status: Abnormal   Collection Time: 02/11/18  8:06 PM  Result Value Ref Range   B Natriuretic Peptide 118.0 (H) 0.0 - 100.0 pg/mL    Comment: Performed at [facility], [address] [place], [place] [address]  Lactic acid, plasma     Status: None   Collection Time: 02/11/18 11:10 PM  Result Value Ref Range   Lactic Acid, Venous 1.7 0.5 - 1.9 mmol/L    Comment: Performed at [facility], [address] [place], [place] [address]  Blood gas, venous     Status: None (Preliminary result)   Collection Time: 02/11/18 11:10 PM  Result Value Ref Range   pH, Ven 7.38 7.250 - 7.430   pCO2, Ven 46 44.0 - 60.0 mmHg   pO2, Ven PENDING 32.0 - 45.0 mmHg   Bicarbonate 27.2 20.0 - 28.0 mmol/L   Acid-Base Excess 1.5 0.0 - 2.0 mmol/L   Patient temperature 37.0    Collection site VEIN    Sample type VENOUS     Comment: Performed at [facility], [address] , [place] [address]  Urinalysis, Complete w Microscopic     Status: Abnormal   Collection Time: 02/12/18  1:48 AM  Result Value Ref Range   Color, Urine YELLOW (A) YELLOW   APPearance CLEAR (A) CLEAR   Specific Gravity, Urine 1.031 (H) 1.005 - 1.030   pH 5.0 5.0 - 8.0   Glucose, UA NEGATIVE NEGATIVE mg/dL   Hgb urine dipstick NEGATIVE NEGATIVE   Bilirubin Urine NEGATIVE NEGATIVE   Ketones, ur NEGATIVE NEGATIVE mg/dL    Protein, ur NEGATIVE NEGATIVE mg/dL   Nitrite NEGATIVE NEGATIVE   Leukocytes, UA NEGATIVE NEGATIVE   RBC / HPF 0-5 0 - 5 RBC/hpf   WBC, UA 0-5 0 - 5 WBC/hpf   Bacteria, UA NONE SEEN NONE SEEN   Squamous Epithelial / LPF 0-5 0 - 5   Mucus PRESENT    Hyaline Casts, UA PRESENT     Comment: Performed at [facility], [address] , [place] [address]  Blood Culture (routine x 2)     Status: None   Collection Time: 02/12/18  2:17 AM  Result Value Ref Range   Specimen Description BLOOD RIGHT ANTECUBITAL    Special Requests      BOTTLES DRAWN AEROBIC AND ANAEROBIC Blood Culture results may not be optimal due to an excessive volume of blood received in culture bottles   Culture      NO GROWTH 5 DAYS Performed at [facility], [address] , [place] [address]    Report Status 02/17/2018 FINAL   Blood Culture (routine x 2)     Status: None   Collection Time: 02/12/18  2:18 AM  Result Value Ref Range   Specimen Description BLOOD RIGHT ARM    Special Requests      BOTTLES DRAWN AEROBIC AND ANAEROBIC Blood Culture results may   not be optimal due to an inadequate volume of blood received in culture bottles   Culture      NO GROWTH 5 DAYS Performed at [facility], [address] [place], [place] [address]    Report Status 02/17/2018 FINAL   Basic metabolic panel     Status: Abnormal   Collection Time: 02/12/18  2:18 AM  Result Value Ref Range   Sodium 139 135 - 145 mmol/L   Potassium 3.2 (L) 3.5 - 5.1 mmol/L   Chloride 104 98 - 111 mmol/L   CO2 26 22 - 32 mmol/L   Glucose, Bld 150 (H) 70 - 99 mg/dL   BUN 22 8 - 23 mg/dL   Creatinine, Ser 1.15 (H) 0.44 - 1.00 mg/dL   Calcium 8.1 (L) 8.9 - 10.3 mg/dL   GFR calc non Af Amer 43 (L) >60 mL/min   GFR calc Af Amer 50 (L) >60 mL/min    Comment: (NOTE) The eGFR has been calculated using the CKD EPI equation. This calculation has not been validated in all clinical situations. eGFR's  persistently <60 mL/min signify possible Chronic Kidney Disease.    Anion gap 9 5 - 15    Comment: Performed at [facility], [address] [place], [place] [address]  CBC     Status: Abnormal   Collection Time: 02/12/18  2:18 AM  Result Value Ref Range   WBC 22.6 (H) 3.6 - 11.0 K/uL   RBC 4.27 3.80 - 5.20 MIL/uL   Hemoglobin 13.3 12.0 - 16.0 g/dL   HCT 39.4 35.0 - 47.0 %   MCV 92.3 80.0 - 100.0 fL   MCH 31.2 26.0 - 34.0 pg   MCHC 33.8 32.0 - 36.0 g/dL   RDW 14.1 11.5 - 14.5 %   Platelets 314 150 - 440 K/uL    Comment: Performed at [facility], [address] [place], [place] [address]  Procalcitonin - Baseline     Status: None   Collection Time: 02/12/18  2:18 AM  Result Value Ref Range   Procalcitonin 4.00 ng/mL    Comment:        Interpretation: PCT > 2 ng/mL: Systemic infection (sepsis) is likely, unless other causes are known. (NOTE)       Sepsis PCT Algorithm           Lower Respiratory Tract                                      Infection PCT Algorithm    ----------------------------     ----------------------------         PCT < 0.25 ng/mL                PCT < 0.10 ng/mL         Strongly encourage             Strongly discourage   discontinuation of antibiotics    initiation of antibiotics    ----------------------------     -----------------------------       PCT 0.25 - 0.50 ng/mL            PCT 0.10 - 0.25 ng/mL               OR       >80% decrease in PCT            Discourage initiation of  antibiotics      Encourage discontinuation           of antibiotics    ----------------------------     -----------------------------         PCT >= 0.50 ng/mL              PCT 0.26 - 0.50 ng/mL               AND       <80% decrease in PCT              Encourage initiation of                                             antibiotics       Encourage continuation           of antibiotics     ----------------------------     -----------------------------        PCT >= 0.50 ng/mL                  PCT > 0.50 ng/mL               AND         increase in PCT                  Strongly encourage                                      initiation of antibiotics    Strongly encourage escalation           of antibiotics                                     -----------------------------                                           PCT <= 0.25 ng/mL                                                 OR                                        > 80% decrease in PCT                                     Discontinue / Do not initiate                                             antibiotics Performed at [facility], [address], [place], [place] [address]   MRSA PCR Screening     Status: None   Collection Time: 02/12/18  1:16 PM  Result Value Ref Range   MRSA by PCR NEGATIVE NEGATIVE    Comment:        The GeneXpert MRSA Assay (FDA approved for NASAL specimens only), is one component of a comprehensive MRSA colonization surveillance program. It is not intended to diagnose MRSA infection nor to guide or monitor treatment for MRSA infections. Performed at [facility], [address] [place], [place] [address]   Procalcitonin     Status: None   Collection Time: 02/13/18  3:50 AM  Result Value Ref Range   Procalcitonin 1.38 ng/mL    Comment:        Interpretation: PCT > 0.5 ng/mL and <= 2 ng/mL: Systemic infection (sepsis) is possible, but other conditions are known to elevate PCT as well. (NOTE)       Sepsis PCT Algorithm           Lower Respiratory Tract                                      Infection PCT Algorithm    ----------------------------     ----------------------------         PCT < 0.25 ng/mL                PCT < 0.10 ng/mL         Strongly encourage             Strongly discourage   discontinuation of antibiotics    initiation of antibiotics     ----------------------------     -----------------------------       PCT 0.25 - 0.50 ng/mL            PCT 0.10 - 0.25 ng/mL               OR       >80% decrease in PCT            Discourage initiation of                                            antibiotics      Encourage discontinuation           of antibiotics    ----------------------------     -----------------------------         PCT >= 0.50 ng/mL              PCT 0.26 - 0.50 ng/mL                AND       <80% decrease in PCT             Encourage initiation of                                             antibiotics       Encourage continuation           of antibiotics    ----------------------------     -----------------------------        PCT >= 0.50 ng/mL                  PCT > 0.50 ng/mL                 AND         increase in PCT                  Strongly encourage                                      initiation of antibiotics    Strongly encourage escalation           of antibiotics                                     -----------------------------                                           PCT <= 0.25 ng/mL                                                 OR                                        > 80% decrease in PCT                                     Discontinue / Do not initiate                                             antibiotics Performed at [facility], [address] [place], [place] [address]   CBC     Status: None   Collection Time: 02/13/18  3:50 AM  Result Value Ref Range   WBC 10.5 3.6 - 11.0 K/uL   RBC 4.54 3.80 - 5.20 MIL/uL   Hemoglobin 13.5 12.0 - 16.0 g/dL   HCT 41.7 35.0 - 47.0 %   MCV 91.7 80.0 - 100.0 fL   MCH 29.7 26.0 - 34.0 pg   MCHC 32.4 32.0 - 36.0 g/dL   RDW 14.2 11.5 - 14.5 %   Platelets 327 150 - 440 K/uL    Comment: Performed at [facility], [address], [place], [place] [address]  Magnesium     Status: None   Collection Time: 02/13/18  3:50 AM  Result  Value Ref Range   Magnesium 2.1 1.7 - 2.4 mg/dL    Comment: Performed at [facility], [address] [place], [place] [address]  Procalcitonin     Status: None   Collection Time: 02/14/18  4:17 AM  Result Value Ref Range   Procalcitonin 0.64 ng/mL    Comment:        Interpretation: PCT > 0.5 ng/mL and <= 2 ng/mL: Systemic infection (sepsis) is possible, but other conditions are known to elevate PCT as well. (NOTE)       Sepsis PCT Algorithm  Lower Respiratory Tract                                      Infection PCT Algorithm    ----------------------------     ----------------------------         PCT < 0.25 ng/mL                PCT < 0.10 ng/mL         Strongly encourage             Strongly discourage   discontinuation of antibiotics    initiation of antibiotics    ----------------------------     -----------------------------       PCT 0.25 - 0.50 ng/mL            PCT 0.10 - 0.25 ng/mL               OR       >80% decrease in PCT            Discourage initiation of                                            antibiotics      Encourage discontinuation           of antibiotics    ----------------------------     -----------------------------         PCT >= 0.50 ng/mL              PCT 0.26 - 0.50 ng/mL                AND       <80% decrease in PCT             Encourage initiation of                                             antibiotics       Encourage continuation           of antibiotics    ----------------------------     -----------------------------        PCT >= 0.50 ng/mL                  PCT > 0.50 ng/mL               AND         increase in PCT                  Strongly encourage                                      initiation of antibiotics    Strongly encourage escalation           of antibiotics                                     -----------------------------  PCT <= 0.25 ng/mL                                                  OR                                        > 80% decrease in PCT                                     Discontinue / Do not initiate                                             antibiotics Performed at [facility], [address] [place], [place] [address]   Basic metabolic panel     Status: Abnormal   Collection Time: 02/14/18  4:17 AM  Result Value Ref Range   Sodium 141 135 - 145 mmol/L   Potassium 3.9 3.5 - 5.1 mmol/L   Chloride 109 98 - 111 mmol/L   CO2 27 22 - 32 mmol/L   Glucose, Bld 94 70 - 99 mg/dL   BUN 23 8 - 23 mg/dL   Creatinine, Ser 0.91 0.44 - 1.00 mg/dL   Calcium 8.2 (L) 8.9 - 10.3 mg/dL   GFR calc non Af Amer 57 (L) >60 mL/min   GFR calc Af Amer >60 >60 mL/min    Comment: (NOTE) The eGFR has been calculated using the CKD EPI equation. This calculation has not been validated in all clinical situations. eGFR's persistently <60 mL/min signify possible Chronic Kidney Disease.    Anion gap 5 5 - 15    Comment: Performed at [facility], [address] [place],  [address]    Radiology: Dg Chest 2 View  Result Date: 02/11/2018 CLINICAL DATA:  Shortness of breath and weakness for 1 month. EXAM: CHEST - 2 VIEW COMPARISON:  08/31/2014 and prior chest radiographs. FINDINGS: Cardiomediastinal silhouette is unremarkable and unchanged. Increased interstitial opacities noted which are nonspecific but may represent interstitial edema. Mild elevation of the LEFT hemidiaphragm is again noted. No definite pleural effusion or pneumothorax. IMPRESSION: Increased nonspecific interstitial opacities, may represent interstitial edema. Consider high-resolution chest CT if interstitial lung disease is suspected. Electronically Signed   By: Jeffrey  Hu M.D.   On: 02/11/2018 20:24   Ct Chest W Contrast  Result Date: 02/11/2018 CLINICAL DATA:  Medial chest pain short of breath EXAM: CT CHEST WITH CONTRAST TECHNIQUE: Multidetector CT imaging  of the chest was performed during intravenous contrast administration. CONTRAST:  60mL OMNIPAQUE IOHEXOL 300 MG/ML  SOLN COMPARISON:  Chest x-ray 02/11/2017, CT chest 09/08/2015 FINDINGS: Cardiovascular: Nonaneurysmal aorta. Moderate aortic atherosclerosis. Coronary vascular calcification. Normal heart size. No pneumothorax. Mediastinum/Nodes: Midline trachea. No thyroid mass. Multiple small lymph nodes in the mediastinum. AP window lymph nodes measure up to 11 mm in size. Small hilar nodes measuring up to 13 mm on the right. Esophagus within normal limits. Lungs/Pleura: Atelectasis in the right middle lobe. No focal consolidation or effusion. 7 mm right lower lobe pulmonary nodule. Additional 2-3 mm multiple   nodules in the bilateral upper lobes. Peribronchial thickening in the perihilar regions bilaterally. Upper Abdomen: No acute abnormality. Scarring upper pole right kidney. Musculoskeletal: DISH changes of the spine. No acute or suspicious abnormality. IMPRESSION: 1. No focal pulmonary infiltrate is visualized. Mild peribronchial thickening in the perihilar regions, possibly due to central airways inflammatory process. 2. Multiple small pulmonary nodules including a 7 mm right lower lobe pulmonary nodule. Non-contrast chest CT at 6-12 months is recommended. If the nodule is stable at time of repeat CT, then future CT at 18-24 months (from today's scan) is considered optional for low-risk patients, but is recommended for high-risk patients. This recommendation follows the consensus statement: Guidelines for Management of Incidental Pulmonary Nodules Detected on CT Images: From the Fleischner Society 2017; Radiology 2017; 284:228-243. 3. Multiple small mediastinal and right hilar lymph nodes, nonspecific, could be due to infection, reactive inflammation, lymphoproliferative abnormality. Aortic Atherosclerosis (ICD10-I70.0). Electronically Signed   By: Kim  Fujinaga M.D.   On: 02/11/2018 22:38    No results  found.  Dg Chest 2 View  Result Date: 02/11/2018 CLINICAL DATA:  Shortness of breath and weakness for 1 month. EXAM: CHEST - 2 VIEW COMPARISON:  08/31/2014 and prior chest radiographs. FINDINGS: Cardiomediastinal silhouette is unremarkable and unchanged. Increased interstitial opacities noted which are nonspecific but may represent interstitial edema. Mild elevation of the LEFT hemidiaphragm is again noted. No definite pleural effusion or pneumothorax. IMPRESSION: Increased nonspecific interstitial opacities, may represent interstitial edema. Consider high-resolution chest CT if interstitial lung disease is suspected. Electronically Signed   By: Jeffrey  Hu M.D.   On: 02/11/2018 20:24   Ct Chest W Contrast  Result Date: 02/11/2018 CLINICAL DATA:  Medial chest pain short of breath EXAM: CT CHEST WITH CONTRAST TECHNIQUE: Multidetector CT imaging of the chest was performed during intravenous contrast administration. CONTRAST:  60mL OMNIPAQUE IOHEXOL 300 MG/ML  SOLN COMPARISON:  Chest x-ray 02/11/2017, CT chest 09/08/2015 FINDINGS: Cardiovascular: Nonaneurysmal aorta. Moderate aortic atherosclerosis. Coronary vascular calcification. Normal heart size. No pneumothorax. Mediastinum/Nodes: Midline trachea. No thyroid mass. Multiple small lymph nodes in the mediastinum. AP window lymph nodes measure up to 11 mm in size. Small hilar nodes measuring up to 13 mm on the right. Esophagus within normal limits. Lungs/Pleura: Atelectasis in the right middle lobe. No focal consolidation or effusion. 7 mm right lower lobe pulmonary nodule. Additional 2-3 mm multiple nodules in the bilateral upper lobes. Peribronchial thickening in the perihilar regions bilaterally. Upper Abdomen: No acute abnormality. Scarring upper pole right kidney. Musculoskeletal: DISH changes of the spine. No acute or suspicious abnormality. IMPRESSION: 1. No focal pulmonary infiltrate is visualized. Mild peribronchial thickening in the perihilar  regions, possibly due to central airways inflammatory process. 2. Multiple small pulmonary nodules including a 7 mm right lower lobe pulmonary nodule. Non-contrast chest CT at 6-12 months is recommended. If the nodule is stable at time of repeat CT, then future CT at 18-24 months (from today's scan) is considered optional for low-risk patients, but is recommended for high-risk patients. This recommendation follows the consensus statement: Guidelines for Management of Incidental Pulmonary Nodules Detected on CT Images: From the Fleischner Society 2017; Radiology 2017; 284:228-243. 3. Multiple small mediastinal and right hilar lymph nodes, nonspecific, could be due to infection, reactive inflammation, lymphoproliferative abnormality. Aortic Atherosclerosis (ICD10-I70.0). Electronically Signed   By: Kim  Fujinaga M.D.   On: 02/11/2018 22:38      Assessment and Plan: Patient Active Problem List   Diagnosis Date Noted  . Sepsis (HCC) 02/11/2018  .   CAP (community acquired pneumonia) 02/11/2018  . CKD (chronic kidney disease), stage III (HCC) 02/11/2018  . Memory loss, short term 02/10/2018  . Flu vaccine need 02/10/2018  . Chest pain 02/03/2018  . Acute upper respiratory infection 02/03/2018  . Gastroesophageal reflux disease without esophagitis 02/03/2018  . Encounter for general adult medical examination with abnormal findings 01/13/2018  . Chronic low back pain 01/13/2018  . Need for vaccination against Streptococcus pneumoniae using pneumococcal conjugate vaccine 13 01/13/2018  . Dysuria 01/13/2018  . 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 Pelvic pain 10/08/2017  . Gout 06/15/2017  . Mixed hyperlipidemia 06/15/2017  . Inflammatory polyarthropathy (HCC) 06/15/2017  . Neuralgia and neuritis, unspecified 06/15/2017  . Major depressive disorder, recurrent, mild (HCC) 06/15/2017  . Other vitamin B12 deficiency anemias 06/15/2017  . Hypothyroidism 10/17/2008  .  OVERWEIGHT/OBESITY 10/17/2008  . SLEEP APNEA, OBSTRUCTIVE 10/17/2008  . Essential hypertension 10/17/2008  . GALLSTONES 10/17/2008  . DYSPNEA 10/17/2008  . Overweight 10/17/2008    1. Pulmonary nodule, left Multiple pulmonary nodules seen on the left side on CT.  Will repeat CAT scan in 6 months for surveillance.  2. CKD (chronic kidney disease), stage III (HCC) Patient's most recent BUN 23 creatinine 0.91And GFR 57  3. OSA (obstructive sleep apnea) Patient unable to tolerate CPAP reports that she feels smothered when she used to try to wear it.  Discussed possible side effects of untreated OSA at this visit.  4. Morbid obesity (HCC) Obesity Counseling: Risk Assessment: An assessment of behavioral risk factors was made today and includes lack of exercise sedentary lifestyle, lack of portion control and poor dietary habits.  Risk Modification Advice: She was counseled on portion control guidelines. Restricting daily caloric intake to. . The detrimental long term effects of obesity on her health and ongoing poor compliance was also discussed with the patient.  5. Gastroesophageal reflux disease without esophagitis Stable, continue with present management.    General Counseling: I have discussed the findings of the evaluation and examination with Meaghan.  I have also discussed any further diagnostic evaluation thatmay be needed or ordered today. Auri verbalizes understanding of the findings of todays visit. We also reviewed her medications today and discussed drug interactions and side effects including but not limited excessive drowsiness and altered mental states. We also discussed that there is always a risk not just to her but also people around her. she has been encouraged to call the office with any questions or concerns that should arise related to todays visit.    Time spent: 25 This patient was seen by   AGNP-C in Collaboration with Dr. Saadat Khan as a part of  collaborative care agreement.   I have personally obtained a history, examined the patient, evaluated laboratory and imaging results, formulated the assessment and plan and placed orders.    Saadat A Khan, MD FCCP Pulmonary and Critical Care Sleep medicine 

## 2018-02-24 NOTE — Telephone Encounter (Signed)
I finished and signed this and gave it back today.

## 2018-02-28 DIAGNOSIS — R457 State of emotional shock and stress, unspecified: Secondary | ICD-10-CM | POA: Diagnosis not present

## 2018-02-28 DIAGNOSIS — N390 Urinary tract infection, site not specified: Secondary | ICD-10-CM | POA: Diagnosis not present

## 2018-02-28 DIAGNOSIS — J441 Chronic obstructive pulmonary disease with (acute) exacerbation: Secondary | ICD-10-CM | POA: Diagnosis not present

## 2018-02-28 DIAGNOSIS — R319 Hematuria, unspecified: Secondary | ICD-10-CM | POA: Diagnosis not present

## 2018-02-28 DIAGNOSIS — R000 Tachycardia, unspecified: Secondary | ICD-10-CM | POA: Diagnosis not present

## 2018-02-28 DIAGNOSIS — R0689 Other abnormalities of breathing: Secondary | ICD-10-CM | POA: Diagnosis not present

## 2018-02-28 DIAGNOSIS — R0602 Shortness of breath: Secondary | ICD-10-CM | POA: Diagnosis not present

## 2018-02-28 DIAGNOSIS — J1089 Influenza due to other identified influenza virus with other manifestations: Secondary | ICD-10-CM | POA: Diagnosis not present

## 2018-02-28 DIAGNOSIS — J101 Influenza due to other identified influenza virus with other respiratory manifestations: Secondary | ICD-10-CM | POA: Diagnosis not present

## 2018-03-01 ENCOUNTER — Encounter: Payer: Self-pay | Admitting: Nurse Practitioner

## 2018-03-01 ENCOUNTER — Ambulatory Visit (INDEPENDENT_AMBULATORY_CARE_PROVIDER_SITE_OTHER): Payer: Medicare HMO | Admitting: Nurse Practitioner

## 2018-03-01 VITALS — BP 150/63 | HR 87 | Temp 96.9°F | Resp 16 | Ht 67.0 in | Wt 241.8 lb

## 2018-03-01 DIAGNOSIS — N390 Urinary tract infection, site not specified: Secondary | ICD-10-CM

## 2018-03-01 DIAGNOSIS — J101 Influenza due to other identified influenza virus with other respiratory manifestations: Secondary | ICD-10-CM

## 2018-03-01 DIAGNOSIS — I10 Essential (primary) hypertension: Secondary | ICD-10-CM | POA: Diagnosis not present

## 2018-03-01 NOTE — Progress Notes (Signed)
[facility] [place], [place] [address]  Internal MEDICINE  Office Visit Note  Patient Name: Christy Carter  [id number]  [id number]  Date of Service: 03/02/2018   Pt is here for a sick visit.  Chief Complaint  Patient presents with  . Medical Management of Chronic Issues    hospital follow up. pt has the flu. pt had pneumonia before she was diagnosed with the flu     The patient was seen In the ER yesterday. Was on he way to [place], and suddenly developed chills and tremor. Felt very sick to her stomach. Stopped at ER in [place], [place]. Was diagnosed with flu and uti. She is currently on antibiotics and tamiflu right now. Her chest x-ray was negative for pneumonia. She is feeling better some better today. She is very congested and aches all over.        Current Medication:  Outpatient Encounter Medications as of 03/01/2018  Medication Sig  . albuterol (PROVENTIL HFA;VENTOLIN HFA) 108 (90 Base) MCG/ACT inhaler Inhale 2 puffs into the lungs every 6 (six) hours as needed for wheezing or shortness of breath.  . celecoxib (CELEBREX) 200 MG capsule Take 1 capsule (200 mg total) by mouth daily.  . cephALEXin (KEFLEX) 500 MG capsule Take 500 mg by mouth 3 (three) times daily.  Marland Kitchen guaiFENesin-dextromethorphan (ROBITUSSIN DM) 100-10 MG/5ML syrup Take 5 mLs by mouth every 4 (four) hours as needed for cough.  Marland Kitchen HYDROcodone-acetaminophen (NORCO/VICODIN) 5-325 MG tablet Take 1 tablet by mouth every 4 (four) hours as needed for moderate pain.  Marland Kitchen levothyroxine (SYNTHROID, LEVOTHROID) 100 MCG tablet TAKE 1 TABLET EVERY DAY (Patient taking differently: Take 100 mcg by mouth daily before breakfast. )  . methylPREDNISolone (MEDROL DOSEPAK) 4 MG TBPK tablet Take 4 mg by mouth.  Marland Kitchen omeprazole (PRILOSEC) 40 MG capsule Take 1 capsule (40 mg total) by mouth daily.  Marland Kitchen oseltamivir (TAMIFLU) 75 MG capsule Take 75 mg by mouth.  . triamterene-hydrochlorothiazide (MAXZIDE-25) 37.5-25  MG per tablet Take 1 tablet by mouth daily.  . vitamin B-12 (CYANOCOBALAMIN) 500 MCG tablet Take 500 mcg by mouth daily.  . Vitamin D, Ergocalciferol, (DRISDOL) 50000 units CAPS capsule TAKE ONE CAPSULE BY MOUTH WEEKLY FOR 12 WEEKS (Patient taking differently: Take 50,000 Units by mouth every 7 (seven) days. )  . simvastatin (ZOCOR) 20 MG tablet TAKE 1 TABLET AT BEDTIME (Patient not taking: Reported on 03/01/2018)  . [DISCONTINUED] azithromycin (ZITHROMAX) 500 MG tablet Take 1 tablet (500 mg total) by mouth daily. (Patient not taking: Reported on 02/22/2018)   No facility-administered encounter medications on file as of 03/01/2018.       Medical History: Past Medical History:  Diagnosis Date  . Arthritis   . Depression   . Hyperlipidemia   . Hypertension   . Shortness of breath   . Stroke ([place])   . Thyroid disease      Vital Signs: BP (!) 150/63 (BP Location: Right Arm, Patient Position: Sitting, Cuff Size: Large)   Pulse 87   Temp (!) 96.9 F (36.1 C)   Resp 16   Ht 5\' 7"  (1.702 m)   Wt 241 lb 12.8 oz (109.7 kg)   SpO2 97%   BMI 37.87 kg/m    Review of Systems  Constitutional: Positive for chills, diaphoresis and fatigue.  HENT: Positive for congestion, ear pain, postnasal drip, rhinorrhea, sinus pain and sore throat. Negative for voice change.   Respiratory: Positive for cough. Negative for chest tightness and  wheezing.   Cardiovascular: Negative for chest pain and palpitations.  Gastrointestinal: Positive for nausea and vomiting.  Endocrine: Negative.   Genitourinary:       Diagnosed with uti yesterday and is currently on keflex to treat.   Musculoskeletal: Positive for myalgias.  Skin: Negative.   Allergic/Immunologic: Negative.   Neurological: Positive for headaches.  Psychiatric/Behavioral: Negative.     Physical Exam  Constitutional: She is oriented to person, place, and time. She appears well-developed and well-nourished. No distress.  HENT:  Head:  Normocephalic and atraumatic.  Nose: Rhinorrhea present.  Mouth/Throat: Oropharynx is clear and moist. No oropharyngeal exudate.  Eyes: Pupils are equal, round, and reactive to light. EOM are normal.  Neck: Normal range of motion. Neck supple. No JVD present. No tracheal deviation present. No thyromegaly present.  Cardiovascular: Normal rate, regular rhythm and normal heart sounds. Exam reveals no gallop and no friction rub.  No murmur heard. Pulmonary/Chest: Effort normal and breath sounds normal. No respiratory distress. She has no wheezes. She has no rales. She exhibits no tenderness.  Congested, non-productive cough present.   Abdominal: Soft. Bowel sounds are normal.  Musculoskeletal: Normal range of motion.  Lymphadenopathy:    She has cervical adenopathy.  Neurological: She is alert and oriented to person, place, and time. No cranial nerve deficit.  Skin: Skin is warm and dry. She is not diaphoretic.  Psychiatric: She has a normal mood and affect. Her behavior is normal. Judgment and thought content normal.  Nursing note and vitals reviewed.  Assessment/Plan: 1. Influenza A Diagnosed with flu A yesterday. Prescription for Tamiflu given in ER. Take as prescribed. Rest and increase fluids. Avoid unnecessary contact with other for next 5 days.  Written rx for tamiflu given to her son, prophylactically. He should start this to protect from developing flu-like symptoms.   2. Urinary tract infection without hematuria, site unspecified Diagnosed in ER yesterday. Take keflex as prescribed. Rest and increase fluid intake.   3. Essential hypertension Generally stable. Continue bp medication as prescribed   General Counseling: Francies verbalizes understanding of the findings of todays visit and agrees with plan of treatment. I have discussed any further diagnostic evaluation that may be needed or ordered today. We also reviewed her medications today. she has been encouraged to call the office  with any questions or concerns that should arise related to todays visit.    Counseling:  Rest and increase fluids. Continue using OTC medication to control symptoms.   This patient was seen by Leretha Pol FNP Collaboration with Dr Lavera Guise as a part of collaborative care agreement  Time spent: 25 Minutes

## 2018-03-02 DIAGNOSIS — N390 Urinary tract infection, site not specified: Secondary | ICD-10-CM | POA: Insufficient documentation

## 2018-03-02 DIAGNOSIS — J101 Influenza due to other identified influenza virus with other respiratory manifestations: Secondary | ICD-10-CM | POA: Insufficient documentation

## 2018-03-02 LAB — BLOOD GAS, VENOUS
Acid-Base Excess: 1.5 mmol/L (ref 0.0–2.0)
Bicarbonate: 27.2 mmol/L (ref 20.0–28.0)
Patient temperature: 37
pCO2, Ven: 46 mmHg (ref 44.0–60.0)
pH, Ven: 7.38 (ref 7.250–7.430)

## 2018-03-19 ENCOUNTER — Other Ambulatory Visit: Payer: Self-pay | Admitting: Nurse Practitioner

## 2018-03-19 DIAGNOSIS — R062 Wheezing: Secondary | ICD-10-CM

## 2018-03-23 ENCOUNTER — Ambulatory Visit: Payer: Self-pay | Admitting: Nurse Practitioner

## 2018-03-26 ENCOUNTER — Encounter: Payer: Self-pay | Admitting: Nurse Practitioner

## 2018-03-26 ENCOUNTER — Ambulatory Visit (INDEPENDENT_AMBULATORY_CARE_PROVIDER_SITE_OTHER): Payer: Medicare HMO | Admitting: Adult Health

## 2018-03-26 VITALS — BP 166/87 | HR 93 | Temp 96.5°F | Resp 16 | Ht 67.0 in | Wt 237.2 lb

## 2018-03-26 DIAGNOSIS — M19019 Primary osteoarthritis, unspecified shoulder: Secondary | ICD-10-CM | POA: Diagnosis not present

## 2018-03-26 DIAGNOSIS — Z6835 Body mass index (BMI) 35.0-35.9, adult: Secondary | ICD-10-CM | POA: Diagnosis not present

## 2018-03-26 DIAGNOSIS — M545 Low back pain, unspecified: Secondary | ICD-10-CM

## 2018-03-26 DIAGNOSIS — K219 Gastro-esophageal reflux disease without esophagitis: Secondary | ICD-10-CM | POA: Diagnosis not present

## 2018-03-26 DIAGNOSIS — G8929 Other chronic pain: Secondary | ICD-10-CM | POA: Diagnosis not present

## 2018-03-26 DIAGNOSIS — M1990 Unspecified osteoarthritis, unspecified site: Secondary | ICD-10-CM | POA: Diagnosis not present

## 2018-03-26 MED ORDER — DICLOFENAC SODIUM 1 % TD GEL: 2.0000 g | Freq: Four times a day (QID) | TRANSDERMAL | 2 refills | Status: DC

## 2018-03-26 MED ORDER — HYDROCODONE-ACETAMINOPHEN 5-325 MG PO TABS: 1.0000 | ORAL_TABLET | ORAL | 0 refills | Status: DC | PRN

## 2018-03-26 NOTE — Patient Instructions (Signed)

## 2018-03-26 NOTE — Progress Notes (Signed)
[facility] [place], [place] [address]  Internal MEDICINE  Office Visit Note  Patient Name: Christy Carter  [id number]  [id number]  Date of Service: 03/26/2018  Chief Complaint  Patient presents with  . Pain    possible arthritis in hands and shoulders,numbness and burning pain  . Arthritis     HPI Pt is here for a sick visit.  Patient is here today reporting bilateral shoulder pain with neck pain and bilateral hand pain.  He has a history of arthritis and reports that this pain is escalating.  She denies any trauma.  She is requesting a refill on her hydrocodone.     Current Medication:  Outpatient Encounter Medications as of 03/26/2018  Medication Sig  . celecoxib (CELEBREX) 200 MG capsule Take 1 capsule (200 mg total) by mouth daily.  . cephALEXin (KEFLEX) 500 MG capsule Take 500 mg by mouth 3 (three) times daily.  Marland Kitchen guaiFENesin-dextromethorphan (ROBITUSSIN DM) 100-10 MG/5ML syrup Take 5 mLs by mouth every 4 (four) hours as needed for cough.  Marland Kitchen HYDROcodone-acetaminophen (NORCO/VICODIN) 5-325 MG tablet Take 1 tablet by mouth every 4 (four) hours as needed for moderate pain.  Marland Kitchen levothyroxine (SYNTHROID, LEVOTHROID) 100 MCG tablet TAKE 1 TABLET EVERY DAY (Patient taking differently: Take 100 mcg by mouth daily before breakfast. )  . omeprazole (PRILOSEC) 40 MG capsule Take 1 capsule (40 mg total) by mouth daily.  Marland Kitchen triamterene-hydrochlorothiazide (MAXZIDE-25) 37.5-25 MG per tablet Take 1 tablet by mouth daily.  . VENTOLIN HFA 108 (90 Base) MCG/ACT inhaler TAKE 2 PUFFS BY MOUTH EVERY 6 HOURS AS NEEDED FOR WHEEZE OR SHORTNESS OF BREATH  . vitamin B-12 (CYANOCOBALAMIN) 500 MCG tablet Take 500 mcg by mouth daily.  . Vitamin D, Ergocalciferol, (DRISDOL) 50000 units CAPS capsule TAKE ONE CAPSULE BY MOUTH WEEKLY FOR 12 WEEKS (Patient taking differently: Take 50,000 Units by mouth every 7 (seven) days. )  . [DISCONTINUED] HYDROcodone-acetaminophen  (NORCO/VICODIN) 5-325 MG tablet Take 1 tablet by mouth every 4 (four) hours as needed for moderate pain.  Marland Kitchen diclofenac sodium (VOLTAREN) 1 % GEL Apply 2 g topically 4 (four) times daily.  . methylPREDNISolone (MEDROL DOSEPAK) 4 MG TBPK tablet Take 4 mg by mouth.  . oseltamivir (TAMIFLU) 75 MG capsule Take 75 mg by mouth.  . simvastatin (ZOCOR) 20 MG tablet TAKE 1 TABLET AT BEDTIME (Patient not taking: Reported on 03/01/2018)   No facility-administered encounter medications on file as of 03/26/2018.       Medical History: Past Medical History:  Diagnosis Date  . Arthritis   . Depression   . Hyperlipidemia   . Hypertension   . Shortness of breath   . Stroke ([place])   . Thyroid disease      Vital Signs: BP (!) 166/87 (BP Location: Right Arm, Patient Position: Sitting, Cuff Size: Large)   Pulse 93   Temp (!) 96.5 F (35.8 C)   Resp 16   Ht 5\' 7"  (1.702 m)   Wt 237 lb 3.2 oz (107.6 kg)   SpO2 94%   BMI 37.15 kg/m    Review of Systems  Constitutional: Negative for chills, fatigue and unexpected weight change.  HENT: Negative for congestion, rhinorrhea, sneezing and sore throat.   Eyes: Negative for photophobia, pain and redness.  Respiratory: Negative for cough, chest tightness and shortness of breath.   Cardiovascular: Negative for chest pain and palpitations.  Gastrointestinal: Negative for abdominal pain, constipation, diarrhea, nausea and vomiting.  Endocrine: Negative.  Genitourinary: Negative for dysuria and frequency.  Musculoskeletal: Negative for arthralgias, back pain, joint swelling and neck pain.  Skin: Negative for rash.  Allergic/Immunologic: Negative.   Neurological: Negative for tremors and numbness.  Hematological: Negative for adenopathy. Does not bruise/bleed easily.  Psychiatric/Behavioral: Negative for behavioral problems and sleep disturbance. The patient is not nervous/anxious.     Physical Exam  Constitutional: She is oriented to person, place,  and time. She appears well-developed and well-nourished. No distress.  HENT:  Head: Normocephalic and atraumatic.  Mouth/Throat: Oropharynx is clear and moist. No oropharyngeal exudate.  Eyes: Pupils are equal, round, and reactive to light. EOM are normal.  Neck: Normal range of motion. Neck supple. No JVD present. No tracheal deviation present. No thyromegaly present.  Cardiovascular: Normal rate, regular rhythm and normal heart sounds. Exam reveals no gallop and no friction rub.  No murmur heard. Pulmonary/Chest: Effort normal and breath sounds normal. No respiratory distress. She has no wheezes. She has no rales. She exhibits no tenderness.  Abdominal: Soft. There is no tenderness. There is no guarding.  Musculoskeletal: Normal range of motion.  Lymphadenopathy:    She has no cervical adenopathy.  Neurological: She is alert and oriented to person, place, and time. No cranial nerve deficit.  Skin: Skin is warm and dry. She is not diaphoretic.  Psychiatric: She has a normal mood and affect. Her behavior is normal. Judgment and thought content normal.  Nursing note and vitals reviewed.  Assessment/Plan: 1. Chronic low back pain Patient hydrocodone refilled for her chronic back pain as well as her bilateral shoulder and hand pain at this time. - HYDROcodone-acetaminophen (NORCO/VICODIN) 5-325 MG tablet; Take 1 tablet by mouth every 4 (four) hours as needed for moderate pain.  Dispense: 20 tablet; Refill: 0 Reviewed risks and possible side effects associated with taking opiates, benzodiazepines and other CNS depressants. Combination of these could cause dizziness and drowsiness. Advised patient not to drive or operate machinery when taking these medications, as patient's and other's life can be at risk and will have consequences. Patient verbalized understanding in this matter. Dependence and abuse for these drugs will be monitored closely. A Controlled substance policy and procedure is on file  which allows [facility] to order a urine drug screen test at any visit. Patient understands and agrees with the plan 2. Arthritis Patient's pain is most likely due to arthritis.  Encouraged her to continue take her Celebrex.  3. Gastroesophageal reflux disease without esophagitis Stable, continue therapy.  4. Morbid obesity ([place]) Obesity Counseling: Risk Assessment: An assessment of behavioral risk factors was made today and includes lack of exercise sedentary lifestyle, lack of portion control and poor dietary habits.  Risk Modification Advice: She was counseled on portion control guidelines. Restricting daily caloric intake to. . The detrimental long term effects of obesity on her health and ongoing poor compliance was also discussed with the patient.  General Counseling: nina mondor understanding of the findings of todays visit and agrees with plan of treatment. I have discussed any further diagnostic evaluation that may be needed or ordered today. We also reviewed her medications today. she has been encouraged to call the office with any questions or concerns that should arise related to todays visit.   No orders of the defined types were placed in this encounter.   Meds ordered this encounter  Medications  . diclofenac sodium (VOLTAREN) 1 % GEL    Sig: Apply 2 g topically 4 (four) times daily.  Dispense:  100 g    Refill:  2  . HYDROcodone-acetaminophen (NORCO/VICODIN) 5-325 MG tablet    Sig: Take 1 tablet by mouth every 4 (four) hours as needed for moderate pain.    Dispense:  20 tablet    Refill:  0    Time spent:25 Minutes  This patient was seen by Orson Gear AGNP-C in Collaboration with Dr Lavera Guise as a part of collaborative care agreement.  Kendell Bane AGNP-C Internal Medicine

## 2018-04-07 DIAGNOSIS — R5383 Other fatigue: Secondary | ICD-10-CM | POA: Diagnosis not present

## 2018-04-07 DIAGNOSIS — R05 Cough: Secondary | ICD-10-CM | POA: Diagnosis not present

## 2018-04-07 DIAGNOSIS — M79641 Pain in right hand: Secondary | ICD-10-CM | POA: Diagnosis not present

## 2018-04-07 DIAGNOSIS — M79642 Pain in left hand: Secondary | ICD-10-CM | POA: Diagnosis not present

## 2018-04-07 DIAGNOSIS — R7309 Other abnormal glucose: Secondary | ICD-10-CM | POA: Diagnosis not present

## 2018-04-19 DIAGNOSIS — M79641 Pain in right hand: Secondary | ICD-10-CM | POA: Diagnosis not present

## 2018-04-19 DIAGNOSIS — R768 Other specified abnormal immunological findings in serum: Secondary | ICD-10-CM | POA: Diagnosis not present

## 2018-04-19 DIAGNOSIS — M79642 Pain in left hand: Secondary | ICD-10-CM | POA: Diagnosis not present

## 2018-04-19 DIAGNOSIS — R202 Paresthesia of skin: Secondary | ICD-10-CM | POA: Diagnosis not present

## 2018-04-19 DIAGNOSIS — R200 Anesthesia of skin: Secondary | ICD-10-CM | POA: Diagnosis not present

## 2018-04-19 DIAGNOSIS — M1811 Unilateral primary osteoarthritis of first carpometacarpal joint, right hand: Secondary | ICD-10-CM | POA: Diagnosis not present

## 2018-04-19 DIAGNOSIS — M1812 Unilateral primary osteoarthritis of first carpometacarpal joint, left hand: Secondary | ICD-10-CM | POA: Diagnosis not present

## 2018-04-19 DIAGNOSIS — G5603 Carpal tunnel syndrome, bilateral upper limbs: Secondary | ICD-10-CM | POA: Diagnosis not present

## 2018-05-10 ENCOUNTER — Ambulatory Visit: Payer: Self-pay | Admitting: Nurse Practitioner

## 2018-05-11 ENCOUNTER — Ambulatory Visit (INDEPENDENT_AMBULATORY_CARE_PROVIDER_SITE_OTHER): Payer: Medicare HMO | Admitting: Adult Health

## 2018-05-11 ENCOUNTER — Encounter: Payer: Self-pay | Admitting: Adult Health

## 2018-05-11 VITALS — BP 171/73 | HR 93 | Resp 16 | Wt 234.0 lb

## 2018-05-11 DIAGNOSIS — E039 Hypothyroidism, unspecified: Secondary | ICD-10-CM

## 2018-05-11 DIAGNOSIS — I10 Essential (primary) hypertension: Secondary | ICD-10-CM | POA: Diagnosis not present

## 2018-05-11 DIAGNOSIS — M19019 Primary osteoarthritis, unspecified shoulder: Secondary | ICD-10-CM | POA: Diagnosis not present

## 2018-05-11 DIAGNOSIS — M79642 Pain in left hand: Secondary | ICD-10-CM

## 2018-05-11 DIAGNOSIS — E782 Mixed hyperlipidemia: Secondary | ICD-10-CM

## 2018-05-11 DIAGNOSIS — M79641 Pain in right hand: Secondary | ICD-10-CM | POA: Diagnosis not present

## 2018-05-11 MED ORDER — HYDROCODONE-ACETAMINOPHEN 5-325 MG PO TABS: 1.0000 | ORAL_TABLET | ORAL | 0 refills | Status: DC | PRN

## 2018-05-11 NOTE — Patient Instructions (Signed)

## 2018-05-11 NOTE — Progress Notes (Signed)
[facility] [place], [place] [address]  Internal MEDICINE  Office Visit Note  Patient Name: Christy Carter  [id number]  [id number]  Date of Service: 05/11/2018  Chief Complaint  Patient presents with  . Hand Problem    losing feeling  . Hypertension  . Hyperlipidemia  . Hypothyroidism    HPI PT is here for follow up on HTN, HLD, and hypothyroid.  She reports she is currently seeing ortho for her hands.  She reports she has numbness and tingling.  She is scheduled for a nerve study next week. She reports she is awake at night crying due to her hand pain. Her blood pressure is slightly elevated at todays visit, 171/73.  She reports her anxiety is high at this time.  She is ready to get some relief from this pain.       Current Medication: Outpatient Encounter Medications as of 05/11/2018  Medication Sig  . celecoxib (CELEBREX) 200 MG capsule Take 1 capsule (200 mg total) by mouth daily.  . cephALEXin (KEFLEX) 500 MG capsule Take 500 mg by mouth 3 (three) times daily.  . diclofenac sodium (VOLTAREN) 1 % GEL Apply 2 g topically 4 (four) times daily.  Marland Kitchen guaiFENesin-dextromethorphan (ROBITUSSIN DM) 100-10 MG/5ML syrup Take 5 mLs by mouth every 4 (four) hours as needed for cough.  Marland Kitchen HYDROcodone-acetaminophen (NORCO/VICODIN) 5-325 MG tablet Take 1 tablet by mouth every 4 (four) hours as needed for moderate pain.  Marland Kitchen levothyroxine (SYNTHROID, LEVOTHROID) 100 MCG tablet TAKE 1 TABLET EVERY DAY (Patient taking differently: Take 100 mcg by mouth daily before breakfast. )  . methylPREDNISolone (MEDROL DOSEPAK) 4 MG TBPK tablet Take 4 mg by mouth.  Marland Kitchen omeprazole (PRILOSEC) 40 MG capsule Take 1 capsule (40 mg total) by mouth daily.  Marland Kitchen oseltamivir (TAMIFLU) 75 MG capsule Take 75 mg by mouth.  . simvastatin (ZOCOR) 20 MG tablet TAKE 1 TABLET AT BEDTIME  . triamterene-hydrochlorothiazide (MAXZIDE-25) 37.5-25 MG per tablet Take 1 tablet by mouth daily.  . VENTOLIN HFA 108  (90 Base) MCG/ACT inhaler TAKE 2 PUFFS BY MOUTH EVERY 6 HOURS AS NEEDED FOR WHEEZE OR SHORTNESS OF BREATH  . vitamin B-12 (CYANOCOBALAMIN) 500 MCG tablet Take 500 mcg by mouth daily.  . Vitamin D, Ergocalciferol, (DRISDOL) 50000 units CAPS capsule TAKE ONE CAPSULE BY MOUTH WEEKLY FOR 12 WEEKS (Patient taking differently: Take 50,000 Units by mouth every 7 (seven) days. )  . [DISCONTINUED] HYDROcodone-acetaminophen (NORCO/VICODIN) 5-325 MG tablet Take 1 tablet by mouth every 4 (four) hours as needed for moderate pain.   No facility-administered encounter medications on file as of 05/11/2018.     Surgical History: Past Surgical History:  Procedure Laterality Date  . APPENDECTOMY    . BACK SURGERY    . CHOLECYSTECTOMY    . HERNIA REPAIR      Medical History: Past Medical History:  Diagnosis Date  . Arthritis   . Depression   . Hyperlipidemia   . Hypertension   . Shortness of breath   . Stroke ([place])   . Thyroid disease     Family History: Family History  Problem Relation Age of Onset  . Heart disease Mother   . Hypertension Mother   . Diverticulitis Mother   . Heart disease Father   . Hypertension Father     Social History   Socioeconomic History  . Marital status: Widowed    Spouse name: Not on file  . Number of children: Not on file  .  Years of education: Not on file  . Highest education level: Not on file  Occupational History  . Not on file  Social Needs  . Financial resource strain: Somewhat hard  . Food insecurity:    Worry: Sometimes true    Inability: Sometimes true  . Transportation needs:    Medical: No    Non-medical: No  Tobacco Use  . Smoking status: Former Smoker    Types: Cigarettes    Last attempt to quit: 08/15/1992    Years since quitting: 25.7  . Smokeless tobacco: Never Used  Substance and Sexual Activity  . Alcohol use: No  . Drug use: No  . Sexual activity: Not on file  Lifestyle  . Physical activity:    Days per week: 0 days     Minutes per session: 0 min  . Stress: Only a little  Relationships  . Social connections:    Talks on phone: More than three times a week    Gets together: More than three times a week    Attends religious service: More than 4 times per year    Active member of club or organization: Yes    Attends meetings of clubs or organizations: 1 to 4 times per year    Relationship status: Widowed  . Intimate partner violence:    Fear of current or ex partner: No    Emotionally abused: No    Physically abused: No    Forced sexual activity: No  Other Topics Concern  . Not on file  Social History Narrative  . Not on file      Review of Systems  Constitutional: Negative for chills, fatigue and unexpected weight change.  HENT: Negative for congestion, rhinorrhea, sneezing and sore throat.   Eyes: Negative for photophobia, pain and redness.  Respiratory: Negative for cough, chest tightness and shortness of breath.   Cardiovascular: Negative for chest pain and palpitations.  Gastrointestinal: Negative for abdominal pain, constipation, diarrhea, nausea and vomiting.  Endocrine: Negative.   Genitourinary: Negative for dysuria and frequency.  Musculoskeletal: Negative for arthralgias, back pain, joint swelling and neck pain.       Bilateral hand pain.  Skin: Negative for rash.  Allergic/Immunologic: Negative.   Neurological: Negative for tremors and numbness.  Hematological: Negative for adenopathy. Does not bruise/bleed easily.  Psychiatric/Behavioral: Negative for behavioral problems and sleep disturbance. The patient is not nervous/anxious.     Vital Signs: BP (!) 171/73   Pulse 93   Resp 16   Wt 234 lb (106.1 kg)   SpO2 96%   BMI 36.65 kg/m    Physical Exam Vitals signs and nursing note reviewed.  Constitutional:      General: She is not in acute distress.    Appearance: She is well-developed. She is not diaphoretic.  HENT:     Head: Normocephalic and atraumatic.      Mouth/Throat:     Pharynx: No oropharyngeal exudate.  Eyes:     Pupils: Pupils are equal, round, and reactive to light.  Neck:     Musculoskeletal: Normal range of motion and neck supple.     Thyroid: No thyromegaly.     Vascular: No JVD.     Trachea: No tracheal deviation.  Cardiovascular:     Rate and Rhythm: Normal rate and regular rhythm.     Heart sounds: Normal heart sounds. No murmur. No friction rub. No gallop.   Pulmonary:     Effort: Pulmonary effort is normal. No respiratory distress.  Breath sounds: Normal breath sounds. No wheezing or rales.  Chest:     Chest wall: No tenderness.  Abdominal:     Palpations: Abdomen is soft.     Tenderness: There is no abdominal tenderness. There is no guarding.  Musculoskeletal: Normal range of motion.  Lymphadenopathy:     Cervical: No cervical adenopathy.  Skin:    General: Skin is warm and dry.  Neurological:     Mental Status: She is alert and oriented to person, place, and time.     Cranial Nerves: No cranial nerve deficit.  Psychiatric:        Behavior: Behavior normal.        Thought Content: Thought content normal.        Judgment: Judgment normal.    Assessment/Plan: 1. Bilateral hand pain Patient is seeing orthopedics for this.  She has a nerve conduction study scheduled for next week.  I encouraged patient to continue follow-up and following their directions.  2. Osteoarthritis of shoulder, unspecified laterality, unspecified osteoarthritis type Patient hydrocodone prescription renewed again at this time for her chronic pain.  - HYDROcodone-acetaminophen (NORCO/VICODIN) 5-325 MG tablet; Take 1 tablet by mouth every 4 (four) hours as needed for moderate pain.  Dispense: 20 tablet; Refill: 0  3. Essential hypertension Elevated today, likely due to pain, continue to monitor at future visits.   4. Hypothyroidism, unspecified type Stable, continue current therapy.   5. Mixed hyperlipidemia Stable, continue  current medications  6. Morbid obesity ([facility]) Obesity Counseling: Risk Assessment: An assessment of behavioral risk factors was made today and includes lack of exercise sedentary lifestyle, lack of portion control and poor dietary habits.  Risk Modification Advice: She was counseled on portion control guidelines. Restricting daily caloric intake to. . The detrimental long term effects of obesity on her health and ongoing poor compliance was also discussed with the patient.    General Counseling: Christy Carter understanding of the findings of todays visit and agrees with plan of treatment. I have discussed any further diagnostic evaluation that may be needed or ordered today. We also reviewed her medications today. she has been encouraged to call the office with any questions or concerns that should arise related to todays visit.    No orders of the defined types were placed in this encounter.   Meds ordered this encounter  Medications  . HYDROcodone-acetaminophen (NORCO/VICODIN) 5-325 MG tablet    Sig: Take 1 tablet by mouth every 4 (four) hours as needed for moderate pain.    Dispense:  20 tablet    Refill:  0    Time spent: 25 Minutes   This patient was seen by Orson Gear AGNP-C in Collaboration with Dr Lavera Guise as a part of collaborative care agreement     Kendell Bane AGNP-C Internal medicine

## 2018-05-18 DIAGNOSIS — R200 Anesthesia of skin: Secondary | ICD-10-CM | POA: Diagnosis not present

## 2018-05-18 DIAGNOSIS — M542 Cervicalgia: Secondary | ICD-10-CM | POA: Diagnosis not present

## 2018-05-18 DIAGNOSIS — R202 Paresthesia of skin: Secondary | ICD-10-CM | POA: Diagnosis not present

## 2018-05-24 ENCOUNTER — Other Ambulatory Visit: Payer: Self-pay

## 2018-05-24 DIAGNOSIS — K219 Gastro-esophageal reflux disease without esophagitis: Secondary | ICD-10-CM

## 2018-05-24 MED ORDER — OMEPRAZOLE 40 MG PO CPDR: 40.0000 mg | DELAYED_RELEASE_CAPSULE | Freq: Every day | ORAL | 3 refills | Status: DC

## 2018-06-02 ENCOUNTER — Other Ambulatory Visit: Payer: Self-pay | Admitting: Internal Medicine

## 2018-06-04 ENCOUNTER — Telehealth: Payer: Self-pay | Admitting: Adult Health

## 2018-06-04 NOTE — Telephone Encounter (Signed)
Spoke with patient regarding refill on medication advised patient that we will recheck vitamin d at next visit .

## 2018-06-07 ENCOUNTER — Other Ambulatory Visit: Payer: Self-pay

## 2018-06-07 DIAGNOSIS — M064 Inflammatory polyarthropathy: Secondary | ICD-10-CM

## 2018-06-07 MED ORDER — CELECOXIB 200 MG PO CAPS: 200.0000 mg | ORAL_CAPSULE | Freq: Every day | ORAL | 3 refills | Status: DC

## 2018-06-18 DIAGNOSIS — G5603 Carpal tunnel syndrome, bilateral upper limbs: Secondary | ICD-10-CM | POA: Diagnosis not present

## 2018-06-18 DIAGNOSIS — M65331 Trigger finger, right middle finger: Secondary | ICD-10-CM | POA: Diagnosis not present

## 2018-06-28 ENCOUNTER — Other Ambulatory Visit: Payer: Self-pay

## 2018-06-28 MED ORDER — LEVOTHYROXINE SODIUM 100 MCG PO TABS: 100.0000 ug | ORAL_TABLET | Freq: Every day | ORAL | 3 refills | Status: DC

## 2018-07-09 ENCOUNTER — Encounter
Admission: RE | Admit: 2018-07-09 | Discharge: 2018-07-09 | Disposition: A | Discharge status: Home / Self Care | Payer: Medicare HMO | Source: Ambulatory Visit | Attending: Surgery | Admitting: Surgery

## 2018-07-09 ENCOUNTER — Other Ambulatory Visit: Payer: Self-pay

## 2018-07-09 DIAGNOSIS — Z01812 Encounter for preprocedural laboratory examination: Secondary | ICD-10-CM | POA: Insufficient documentation

## 2018-07-09 HISTORY — DX: Adverse effect of unspecified anesthetic, initial encounter: T41.45XA

## 2018-07-09 HISTORY — DX: Other complications of anesthesia, initial encounter: T88.59XA

## 2018-07-09 LAB — BASIC METABOLIC PANEL
Anion gap: 10 (ref 5–15)
BUN: 29 mg/dL — ABNORMAL HIGH (ref 8–23)
CO2: 28 mmol/L (ref 22–32)
Calcium: 8.7 mg/dL — ABNORMAL LOW (ref 8.9–10.3)
Chloride: 102 mmol/L (ref 98–111)
Creatinine, Ser: 1.07 mg/dL — ABNORMAL HIGH (ref 0.44–1.00)
GFR calc Af Amer: 56 mL/min — ABNORMAL LOW (ref >60)
GFR calc non Af Amer: 48 mL/min — ABNORMAL LOW (ref >60)
Glucose, Bld: 90 mg/dL (ref 70–99)
Potassium: 3.3 mmol/L — ABNORMAL LOW (ref 3.5–5.1)
Sodium: 140 mmol/L (ref 135–145)

## 2018-07-09 LAB — CBC
HCT: 45.1 % (ref 36.0–46.0)
Hemoglobin: 14 g/dL (ref 12.0–15.0)
MCH: 28.5 pg (ref 26.0–34.0)
MCHC: 31 g/dL (ref 30.0–36.0)
MCV: 91.9 fL (ref 80.0–100.0)
Platelets: 257 10*3/uL (ref 150–400)
RBC: 4.91 MIL/uL (ref 3.87–5.11)
RDW: 14.5 % (ref 11.5–15.5)
WBC: 7.5 10*3/uL (ref 4.0–10.5)
nRBC: 0 % (ref 0.0–0.2)

## 2018-07-09 NOTE — Patient Instructions (Signed)
Your procedure is scheduled on: Tuesday 07/20/18.  Report to DAY SURGERY DEPARTMENT LOCATED ON 2ND FLOOR MEDICAL MALL ENTRANCE. To find out your arrival time please call [phone] between 1PM - 3PM on Monday 07/19/18.   Remember: Instructions that are not followed completely may result in serious medical risk, up to and including death, or upon the discretion of your surgeon and anesthesiologist your surgery may need to be rescheduled.      _X__ 1. Do not eat food after midnight the night before your procedure.                 No gum chewing or hard candies. You may drink clear liquids up to 2 hours                 before you are scheduled to arrive for your surgery- DO NOT drink clear                 liquids within 2 hours of the start of your surgery.                 Clear Liquids include:  water, apple juice without pulp, clear carbohydrate                 drink such as Clearfast or Gatorade, Black Coffee or Tea (Do not add                 milk or creamer to coffee or tea).   __X__2.  On the morning of surgery brush your teeth with toothpaste and water, you may rinse your mouth with mouthwash if you wish.  Do not swallow any toothpaste or mouthwash.      __X__3.  Notify your doctor if there is any change in your medical condition      (cold, fever, infections).      Do not wear jewelry, make-up, hairpins, clips or nail polish. Do not wear lotions, powders, or perfumes.  Do not shave 48 hours prior to surgery. Men may shave face and neck. Do not bring valuables to the hospital.      [facility] is not responsible for any belongings or valuables.   Contacts, dentures/partials or body piercings may not be worn into surgery. Bring a case for your contacts, glasses or hearing aids, a denture cup will be supplied.   Patients discharged the day of surgery will not be allowed to drive home.    Please read over the following fact sheets that you were given:   MRSA  Information   __X__ Take these medicines the morning of surgery with A SIP OF WATER:     1. levothyroxine (SYNTHROID, LEVOTHROID) 100 MCG tablet  2. omeprazole (PRILOSEC) 40 MG capsule  3. VENTOLIN HFA 108 (90 Base) MCG/ACT inhaler       __X__ Use CHG Soap as directed    _ X___ Use inhalers on the day of surgery. Also bring the inhaler with you to the hospital on the morning of surgery.    __X__ Stop Anti-inflammatories 7 days before surgery such as Advil, Ibuprofen, Motrin, BC or Goodies Powder, Naprosyn, Naproxen, Aleve, Aspirin, Meloxicam,Celebrex. Last dose will be on Tuesday 07/13/18.  May take Tylenol if needed for pain or discomfort.     __X__ Stop the following herbal supplements on Tuesday 07/13/18:    vitamin E 400 UNIT capsule  .

## 2018-07-09 NOTE — Pre-Procedure Instructions (Signed)
Faxed abnormal BMP results to Dr. Roland Rack. Fax confirmation received.

## 2018-07-19 MED ORDER — CEFAZOLIN SODIUM-DEXTROSE 2-4 GM/100ML-% IV SOLN: 2.0000 g | Freq: Once | INTRAVENOUS | Status: DC

## 2018-07-20 ENCOUNTER — Ambulatory Visit: Admission: RE | Admit: 2018-07-20 | Payer: Medicare HMO | Source: Home / Self Care | Admitting: Surgery

## 2018-07-20 ENCOUNTER — Encounter: Admission: RE | Payer: Self-pay | Source: Home / Self Care

## 2018-07-20 SURGERY — RELEASE, CARPAL TUNNEL, ENDOSCOPIC
Anesthesia: Choice | Laterality: Right

## 2018-07-21 ENCOUNTER — Ambulatory Visit (INDEPENDENT_AMBULATORY_CARE_PROVIDER_SITE_OTHER): Payer: Medicare HMO | Admitting: Nurse Practitioner

## 2018-07-21 ENCOUNTER — Encounter: Payer: Self-pay | Admitting: Nurse Practitioner

## 2018-07-21 ENCOUNTER — Other Ambulatory Visit: Payer: Self-pay

## 2018-07-21 VITALS — BP 148/68 | HR 75 | Temp 98.2°F | Resp 16 | Ht 66.0 in | Wt 220.0 lb

## 2018-07-21 DIAGNOSIS — R319 Hematuria, unspecified: Secondary | ICD-10-CM

## 2018-07-21 DIAGNOSIS — N390 Urinary tract infection, site not specified: Secondary | ICD-10-CM | POA: Diagnosis not present

## 2018-07-21 DIAGNOSIS — I10 Essential (primary) hypertension: Secondary | ICD-10-CM

## 2018-07-21 DIAGNOSIS — R82998 Other abnormal findings in urine: Secondary | ICD-10-CM | POA: Diagnosis not present

## 2018-07-21 DIAGNOSIS — M064 Inflammatory polyarthropathy: Secondary | ICD-10-CM | POA: Diagnosis not present

## 2018-07-21 LAB — POCT URINALYSIS DIPSTICK
Bilirubin, UA: NEGATIVE
Glucose, UA: NEGATIVE
Ketones, UA: NEGATIVE
Nitrite, UA: NEGATIVE
Protein, UA: NEGATIVE
Spec Grav, UA: <=1.005 — AB (ref 1.010–1.025)
Urobilinogen, UA: 0.2 E.U./dL
pH, UA: 7 (ref 5.0–8.0)

## 2018-07-21 MED ORDER — PREDNISONE 10 MG (21) PO TBPK: ORAL_TABLET | ORAL | 0 refills | Status: DC

## 2018-07-21 MED ORDER — CELECOXIB 200 MG PO CAPS: 200.0000 mg | ORAL_CAPSULE | Freq: Every day | ORAL | 3 refills | Status: DC

## 2018-07-21 MED ORDER — AMOXICILLIN-POT CLAVULANATE 875-125 MG PO TABS: 1.0000 | ORAL_TABLET | Freq: Two times a day (BID) | ORAL | 0 refills | Status: DC

## 2018-07-21 NOTE — Progress Notes (Signed)
[facility] [place], [place] [address]  Internal MEDICINE  Office Visit Note  Patient Name: Christy Carter  [id number]  [id number]  Date of Service: 08/02/2018   Pt is here for a sick visit.  Chief Complaint  Patient presents with  . Fatigue    pt has been feeling weak, going on for a while pt states she is getting weaker by the day, pt gives out fast, can hardly get up and move around without giving out     The patient c/o lower back pain. This is intermittent. Makes it difficult for her to stand up from seated position and to walk without difficulty. Generally, she takes celebrex every day which will help keep pain and inflammation manageable. She has also been feeling very weak and fatigued., more than usual. Blood pressure is mildly elevated, presumably from increased pain and weakness.        Current Medication:  Outpatient Encounter Medications as of 07/21/2018  Medication Sig  . celecoxib (CELEBREX) 200 MG capsule Take 1 capsule (200 mg total) by mouth daily.  . Cyanocobalamin 2500 MCG TABS Take 2,500 mcg by mouth daily as needed (lethargy).   Marland Kitchen diclofenac sodium (VOLTAREN) 1 % GEL Apply 2 g topically 4 (four) times daily. (Patient taking differently: Apply 2 g topically daily as needed (joint pain). )  . ibuprofen (ADVIL,MOTRIN) 200 MG tablet Take 600 mg by mouth every 6 (six) hours as needed for moderate pain.  Marland Kitchen levothyroxine (SYNTHROID, LEVOTHROID) 100 MCG tablet Take 1 tablet (100 mcg total) by mouth daily. (Patient taking differently: Take 100 mcg by mouth daily before breakfast. )  . omeprazole (PRILOSEC) 40 MG capsule Take 1 capsule (40 mg total) by mouth daily.  Marland Kitchen triamterene-hydrochlorothiazide (MAXZIDE-25) 37.5-25 MG tablet TAKE 1 TABLET EVERY DAY FOR BLOOD PRESSURE (Patient taking differently: Take 1 tablet by mouth daily. )  . VENTOLIN HFA 108 (90 Base) MCG/ACT inhaler TAKE 2 PUFFS BY MOUTH EVERY 6 HOURS AS NEEDED FOR WHEEZE OR  SHORTNESS OF BREATH (Patient taking differently: Inhale 2 puffs into the lungs every 6 (six) hours as needed for wheezing or shortness of breath. )  . vitamin E 400 UNIT capsule Take 400 Units by mouth daily.  . [DISCONTINUED] celecoxib (CELEBREX) 200 MG capsule Take 1 capsule (200 mg total) by mouth daily.  Marland Kitchen amoxicillin-clavulanate (AUGMENTIN) 875-125 MG tablet Take 1 tablet by mouth 2 (two) times daily.  Marland Kitchen guaiFENesin-dextromethorphan (ROBITUSSIN DM) 100-10 MG/5ML syrup Take 5 mLs by mouth every 4 (four) hours as needed for cough. (Patient not taking: Reported on 07/05/2018)  . HYDROcodone-acetaminophen (NORCO/VICODIN) 5-325 MG tablet Take 1 tablet by mouth every 4 (four) hours as needed for moderate pain. (Patient not taking: Reported on 07/05/2018)  . omeprazole (PRILOSEC) 20 MG capsule TAKE 1 CAPSULE EVERY DAY (Patient not taking: Reported on 07/05/2018)  . predniSONE (STERAPRED UNI-PAK 21 TAB) 10 MG (21) TBPK tablet 6 day taper - take by mouth as directed for 6 days  . simvastatin (ZOCOR) 20 MG tablet TAKE 1 TABLET AT BEDTIME (Patient not taking: Reported on 07/05/2018)   No facility-administered encounter medications on file as of 07/21/2018.       Medical History: Past Medical History:  Diagnosis Date  . Arthritis   . Carpal tunnel syndrome   . Complication of anesthesia    Difficulty waking up. Believes it took 2 days to wake up after her surgery.  . Depression   . Hyperlipidemia   . Hypertension   .  Shortness of breath   . Stroke ([place])   . Thyroid disease      Today's Vitals   07/21/18 1153  BP: (!) 148/68  Pulse: 75  Resp: 16  Temp: 98.2 F (36.8 C)  SpO2: 97%  Weight: 220 lb (99.8 kg)  Height: 5\' 6"  (1.676 m)   Body mass index is 35.51 kg/m.  Review of Systems  Constitutional: Positive for activity change and fatigue. Negative for chills and unexpected weight change.  HENT: Negative for congestion, rhinorrhea, sneezing and sore throat.   Respiratory: Positive  for shortness of breath. Negative for cough, chest tightness and wheezing.   Cardiovascular: Negative for chest pain and palpitations.       Mildly elevated blood pressure today.  Gastrointestinal: Positive for nausea. Negative for abdominal pain, constipation, diarrhea and vomiting.  Endocrine: Negative for cold intolerance, heat intolerance, polydipsia and polyuria.  Genitourinary: Positive for frequency and urgency. Negative for dysuria, flank pain and pelvic pain.  Musculoskeletal: Positive for arthralgias, back pain and myalgias. Negative for joint swelling and neck pain.  Skin: Negative for rash.  Allergic/Immunologic: Positive for environmental allergies.  Neurological: Positive for weakness and headaches. Negative for dizziness, tremors and numbness.  Hematological: Negative for adenopathy. Does not bruise/bleed easily.  Psychiatric/Behavioral: Negative for behavioral problems, dysphoric mood and sleep disturbance. The patient is not nervous/anxious.     Physical Exam Vitals signs and nursing note reviewed.  Constitutional:      General: She is not in acute distress.    Appearance: She is well-developed. She is ill-appearing. She is not diaphoretic.  HENT:     Head: Normocephalic and atraumatic.     Nose: Nose normal.     Mouth/Throat:     Pharynx: No oropharyngeal exudate.  Eyes:     Conjunctiva/sclera: Conjunctivae normal.     Pupils: Pupils are equal, round, and reactive to light.  Neck:     Musculoskeletal: Normal range of motion and neck supple.     Thyroid: No thyromegaly.     Vascular: No JVD.     Trachea: No tracheal deviation.  Cardiovascular:     Rate and Rhythm: Normal rate and regular rhythm.     Heart sounds: Normal heart sounds. No murmur. No friction rub. No gallop.   Pulmonary:     Effort: Pulmonary effort is normal. No respiratory distress.     Breath sounds: Normal breath sounds. No wheezing or rales.  Chest:     Chest wall: No tenderness.  Abdominal:      General: Bowel sounds are normal.     Palpations: Abdomen is soft.     Tenderness: There is no abdominal tenderness.  Genitourinary:    Comments: Urine sample positive for trace blood and elevated pH.  Musculoskeletal: Normal range of motion.     Comments: Moderate lower back pain, worse with exertion. Using a cane to help with mobility.   Lymphadenopathy:     Cervical: No cervical adenopathy.  Skin:    General: Skin is warm and dry.     Capillary Refill: Capillary refill takes less than 2 seconds.  Neurological:     Mental Status: She is alert and oriented to person, place, and time.     Cranial Nerves: No cranial nerve deficit.     Comments: She is at her neurological baseline .  Psychiatric:        Behavior: Behavior normal.        Thought Content: Thought content normal.  Judgment: Judgment normal.   Assessment/Plan: 1. Urinary tract infection without hematuria, site unspecified Start augmentin 875mg  bid for 10 days. Sent urine for culture and sensitivity and adjust antibiotics as indicated.  - amoxicillin-clavulanate (AUGMENTIN) 875-125 MG tablet; Take 1 tablet by mouth 2 (two) times daily.  Dispense: 20 tablet; Refill: 0  2. Hematuria, unspecified type Start augmentin 875mg  bid for 10 days. Sent urine for culture and sensitivity and adjust antibiotics as indicated.  - POCT Urinalysis Dipstick - CULTURE, URINE COMPREHENSIVE  3. Inflammatory polyarthropathy (HCC) Prednisone 10mg  dose pack. Take as directed for 6 days to reduce pain and inflammation. contineu celebrex 200mg  daily.  - predniSONE (STERAPRED UNI-PAK 21 TAB) 10 MG (21) TBPK tablet; 6 day taper - take by mouth as directed for 6 days  Dispense: 21 tablet; Refill: 0 - celecoxib (CELEBREX) 200 MG capsule; Take 1 capsule (200 mg total) by mouth daily.  Dispense: 30 capsule; Refill: 3  4. Essential hypertension Generally stable. Continue bp medication as prescribed.   General Counseling: mailee klaas  understanding of the findings of todays visit and agrees with plan of treatment. I have discussed any further diagnostic evaluation that may be needed or ordered today. We also reviewed her medications today. she has been encouraged to call the office with any questions or concerns that should arise related to todays visit.    Counseling:  Hypertension Counseling:   The following hypertensive lifestyle modification were recommended and discussed:  1. Limiting alcohol intake to less than 1 oz/day of ethanol:(24 oz of beer or 8 oz of wine or 2 oz of 100-proof whiskey). 2. Take baby ASA 81 mg daily. 3. Importance of regular aerobic exercise and losing weight. 4. Reduce dietary saturated fat and cholesterol intake for overall cardiovascular health. 5. Maintaining adequate dietary potassium, calcium, and magnesium intake. 6. Regular monitoring of the blood pressure. 7. Reduce sodium intake to less than 100 mmol/day (less than 2.3 gm of sodium or less than 6 gm of sodium choride)   This patient was seen by [place] with Dr Lavera Guise as a part of collaborative care agreement  Orders Placed This Encounter  Procedures  . CULTURE, URINE COMPREHENSIVE  . POCT Urinalysis Dipstick    Meds ordered this encounter  Medications  . predniSONE (STERAPRED UNI-PAK 21 TAB) 10 MG (21) TBPK tablet    Sig: 6 day taper - take by mouth as directed for 6 days    Dispense:  21 tablet    Refill:  0    Order Specific Question:   Supervising Provider    Answer:   Lavera Guise [place]  . amoxicillin-clavulanate (AUGMENTIN) 875-125 MG tablet    Sig: Take 1 tablet by mouth 2 (two) times daily.    Dispense:  20 tablet    Refill:  0    Order Specific Question:   Supervising Provider    Answer:   Lavera Guise [[id number]]  . celecoxib (CELEBREX) 200 MG capsule    Sig: Take 1 capsule (200 mg total) by mouth daily.    Dispense:  30 capsule    Refill:  3    Order Specific Question:    Supervising Provider    Answer:   Lavera Guise [[id number]]    Time spent: 25 Minutes

## 2018-07-21 NOTE — Progress Notes (Signed)
Pt blood pressure elevated informed provider. Blood pressure taken manually

## 2018-07-26 LAB — CULTURE, URINE COMPREHENSIVE

## 2018-08-02 DIAGNOSIS — R319 Hematuria, unspecified: Secondary | ICD-10-CM | POA: Insufficient documentation

## 2018-08-12 ENCOUNTER — Ambulatory Visit (INDEPENDENT_AMBULATORY_CARE_PROVIDER_SITE_OTHER): Payer: Medicare Other | Admitting: Internal Medicine

## 2018-08-12 ENCOUNTER — Encounter: Payer: Self-pay | Admitting: Internal Medicine

## 2018-08-12 ENCOUNTER — Other Ambulatory Visit: Payer: Self-pay

## 2018-08-12 DIAGNOSIS — M79642 Pain in left hand: Secondary | ICD-10-CM | POA: Diagnosis not present

## 2018-08-12 DIAGNOSIS — M064 Inflammatory polyarthropathy: Secondary | ICD-10-CM

## 2018-08-12 DIAGNOSIS — M79641 Pain in right hand: Secondary | ICD-10-CM

## 2018-08-12 MED ORDER — PREDNISONE 10 MG (21) PO TBPK: ORAL_TABLET | ORAL | 0 refills | Status: DC

## 2018-08-12 NOTE — Progress Notes (Signed)
[facility] [place], [place] [address]  Internal MEDICINE  Telephone Visit  Patient Name: Christy Carter  [id number]  [id number]  Date of Service: 08/17/2018  I connected with the patient at 1100 by telephone and verified the patients identity using two identifiers.   I discussed the limitations, risks, security and privacy concerns of performing an evaluation and management service by telephone and the availability of in person appointments. I also discussed with the patient that there may be a patient responsible charge related to the service.  The patient expressed understanding and agrees to proceed.    Chief Complaint  Patient presents with  . Telephone Screen    follow up 3 months   . Numbness    in both hands    HPI Pt is c/o numbness and pain in both hands, she is diagnosis of carpal tunnel syndrome She is unable to see her ortho due to COVID cancellation. Pt will like to have a refill on her prednisone which she takes on and off for a flare up. She does not take this medicine on a regular basis. She denies any other symptoms at this time  Current Medication: Outpatient Encounter Medications as of 08/12/2018  Medication Sig  . celecoxib (CELEBREX) 200 MG capsule Take 1 capsule (200 mg total) by mouth daily.  . Cyanocobalamin 2500 MCG TABS Take 2,500 mcg by mouth daily as needed (lethargy).   Marland Kitchen diclofenac sodium (VOLTAREN) 1 % GEL Apply 2 g topically 4 (four) times daily. (Patient taking differently: Apply 2 g topically daily as needed (joint pain). )  . guaiFENesin-dextromethorphan (ROBITUSSIN DM) 100-10 MG/5ML syrup Take 5 mLs by mouth every 4 (four) hours as needed for cough.  Marland Kitchen ibuprofen (ADVIL,MOTRIN) 200 MG tablet Take 600 mg by mouth every 6 (six) hours as needed for moderate pain.  Marland Kitchen levothyroxine (SYNTHROID, LEVOTHROID) 100 MCG tablet Take 1 tablet (100 mcg total) by mouth daily. (Patient taking differently: Take 100 mcg by mouth daily before  breakfast. )  . omeprazole (PRILOSEC) 40 MG capsule Take 1 capsule (40 mg total) by mouth daily.  . simvastatin (ZOCOR) 20 MG tablet TAKE 1 TABLET AT BEDTIME  . triamterene-hydrochlorothiazide (MAXZIDE-25) 37.5-25 MG tablet TAKE 1 TABLET EVERY DAY FOR BLOOD PRESSURE (Patient taking differently: Take 1 tablet by mouth daily. )  . VENTOLIN HFA 108 (90 Base) MCG/ACT inhaler TAKE 2 PUFFS BY MOUTH EVERY 6 HOURS AS NEEDED FOR WHEEZE OR SHORTNESS OF BREATH (Patient taking differently: Inhale 2 puffs into the lungs every 6 (six) hours as needed for wheezing or shortness of breath. )  . vitamin E 400 UNIT capsule Take 400 Units by mouth daily.  Marland Kitchen amoxicillin-clavulanate (AUGMENTIN) 875-125 MG tablet Take 1 tablet by mouth 2 (two) times daily. (Patient not taking: Reported on 08/12/2018)  . HYDROcodone-acetaminophen (NORCO/VICODIN) 5-325 MG tablet Take 1 tablet by mouth every 4 (four) hours as needed for moderate pain. (Patient not taking: Reported on 07/05/2018)  . omeprazole (PRILOSEC) 20 MG capsule TAKE 1 CAPSULE EVERY DAY (Patient not taking: Reported on 08/12/2018)  . predniSONE (STERAPRED UNI-PAK 21 TAB) 10 MG (21) TBPK tablet 6 day taper - take by mouth as directed for 6 days  . [DISCONTINUED] predniSONE (STERAPRED UNI-PAK 21 TAB) 10 MG (21) TBPK tablet 6 day taper - take by mouth as directed for 6 days (Patient not taking: Reported on 08/12/2018)   No facility-administered encounter medications on file as of 08/12/2018.     Surgical History: Past  Surgical History:  Procedure Laterality Date  . APPENDECTOMY    . BACK SURGERY    . CHOLECYSTECTOMY    . HERNIA REPAIR      Medical History: Past Medical History:  Diagnosis Date  . Arthritis   . Carpal tunnel syndrome   . Complication of anesthesia    Difficulty waking up. Believes it took 2 days to wake up after her surgery.  . Depression   . Hyperlipidemia   . Hypertension   . Shortness of breath   . Stroke ([place])   . Thyroid disease     Family  History: Family History  Problem Relation Age of Onset  . Heart disease Mother   . Hypertension Mother   . Diverticulitis Mother   . Heart disease Father   . Hypertension Father     Social History   Socioeconomic History  . Marital status: Widowed    Spouse name: Not on file  . Number of children: Not on file  . Years of education: Not on file  . Highest education level: Not on file  Occupational History  . Not on file  Social Needs  . Financial resource strain: Somewhat hard  . Food insecurity:    Worry: Sometimes true    Inability: Sometimes true  . Transportation needs:    Medical: No    Non-medical: No  Tobacco Use  . Smoking status: Former Smoker    Types: Cigarettes    Last attempt to quit: 08/15/1992    Years since quitting: 26.0  . Smokeless tobacco: Never Used  Substance and Sexual Activity  . Alcohol use: No  . Drug use: No  . Sexual activity: Not Currently  Lifestyle  . Physical activity:    Days per week: 0 days    Minutes per session: 0 min  . Stress: Only a little  Relationships  . Social connections:    Talks on phone: More than three times a week    Gets together: More than three times a week    Attends religious service: More than 4 times per year    Active member of club or organization: Yes    Attends meetings of clubs or organizations: 1 to 4 times per year    Relationship status: Widowed  . Intimate partner violence:    Fear of current or ex partner: No    Emotionally abused: No    Physically abused: No    Forced sexual activity: No  Other Topics Concern  . Not on file  Social History Narrative  . Not on file   Review of Systems  Vital Signs: There were no vitals taken for this visit.   Observation/Objective: Pt seems to be in good spirits, She is Exerting covid precautions    Assessment/Plan: 1. Inflammatory polyarthropathy (HCC) - predniSONE (STERAPRED UNI-PAK 21 TAB) 10 MG (21) TBPK tablet; 6 day taper - take by mouth as  directed for 6 days  Dispense: 21 tablet; Refill: 0  2. Bilateral hand pain - Controlled with Tylenol    General Counseling: Christy Carter verbalizes understanding of the findings of today's phone visit and agrees with plan of treatment. I have discussed any further diagnostic evaluation that may be needed or ordered today. We also reviewed her medications today. she has been encouraged to call the office with any questions or concerns that should arise related to todays visit.     Meds ordered this encounter  Medications  . predniSONE (STERAPRED UNI-PAK 21 TAB) 10 MG (  21) TBPK tablet    Sig: 6 day taper - take by mouth as directed for 6 days    Dispense:  21 tablet    Refill:  0    Time spent:15 Minutes    Dr Lavera Guise Internal medicine

## 2018-08-23 ENCOUNTER — Ambulatory Visit (INDEPENDENT_AMBULATORY_CARE_PROVIDER_SITE_OTHER): Payer: Medicare Other | Admitting: Adult Health

## 2018-08-23 ENCOUNTER — Encounter: Payer: Self-pay | Admitting: Adult Health

## 2018-08-23 ENCOUNTER — Other Ambulatory Visit: Payer: Self-pay

## 2018-08-23 VITALS — Resp 16 | Ht 69.0 in | Wt 220.0 lb

## 2018-08-23 DIAGNOSIS — Z6832 Body mass index (BMI) 32.0-32.9, adult: Secondary | ICD-10-CM

## 2018-08-23 DIAGNOSIS — R0602 Shortness of breath: Secondary | ICD-10-CM | POA: Diagnosis not present

## 2018-08-23 DIAGNOSIS — E6609 Other obesity due to excess calories: Secondary | ICD-10-CM

## 2018-08-23 DIAGNOSIS — M064 Inflammatory polyarthropathy: Secondary | ICD-10-CM

## 2018-08-23 DIAGNOSIS — G4733 Obstructive sleep apnea (adult) (pediatric): Secondary | ICD-10-CM

## 2018-08-23 DIAGNOSIS — R911 Solitary pulmonary nodule: Secondary | ICD-10-CM

## 2018-08-23 MED ORDER — PREDNISONE 10 MG (21) PO TBPK: ORAL_TABLET | ORAL | 0 refills | Status: DC

## 2018-08-23 NOTE — Progress Notes (Signed)
[facility] [place], [place] [address]  Internal MEDICINE  Telephone Visit  Patient Name: Christy Carter  [id number]  [id number]  Date of Service: 08/23/2018  I connected with the patient at 1123 by telephone and verified the patients identity using two identifiers.  I discussed the limitations, risks, security and privacy concerns of performing an evaluation and management service by telephone and the availability of in person appointments. I also discussed with the patient that there may be a patient responsible charge related to the service.  The patient expressed understanding and agrees to proceed.    Chief Complaint  Patient presents with  . Telephone Assessment  . Telephone Screen  . Follow-up         HPI  Pt following up on pulmonary issues. She was hospitalized and treated for pneumonia just over 6 months ago.  Some nodules were found on her left lower lobe, and it is time to get another CT to evaluate those nodules for progression.  Overall patient reports she is doing well.  Denies any chest pain, hemoptysis or breathing issues to speak of.  She has been keeping herself at home due to covid 19.  She has managed to stay well at this time.      Current Medication: Outpatient Encounter Medications as of 08/23/2018  Medication Sig  . celecoxib (CELEBREX) 200 MG capsule Take 1 capsule (200 mg total) by mouth daily.  . Cyanocobalamin 2500 MCG TABS Take 2,500 mcg by mouth daily as needed (lethargy).   Marland Kitchen diclofenac sodium (VOLTAREN) 1 % GEL Apply 2 g topically 4 (four) times daily. (Patient taking differently: Apply 2 g topically daily as needed (joint pain). )  . guaiFENesin-dextromethorphan (ROBITUSSIN DM) 100-10 MG/5ML syrup Take 5 mLs by mouth every 4 (four) hours as needed for cough.  Marland Kitchen HYDROcodone-acetaminophen (NORCO/VICODIN) 5-325 MG tablet Take 1 tablet by mouth every 4 (four) hours as needed for moderate pain.  Marland Kitchen ibuprofen (ADVIL,MOTRIN) 200 MG  tablet Take 600 mg by mouth every 6 (six) hours as needed for moderate pain.  Marland Kitchen levothyroxine (SYNTHROID, LEVOTHROID) 100 MCG tablet Take 1 tablet (100 mcg total) by mouth daily. (Patient taking differently: Take 100 mcg by mouth daily before breakfast. )  . omeprazole (PRILOSEC) 20 MG capsule TAKE 1 CAPSULE EVERY DAY  . omeprazole (PRILOSEC) 40 MG capsule Take 1 capsule (40 mg total) by mouth daily.  . simvastatin (ZOCOR) 20 MG tablet TAKE 1 TABLET AT BEDTIME  . triamterene-hydrochlorothiazide (MAXZIDE-25) 37.5-25 MG tablet TAKE 1 TABLET EVERY DAY FOR BLOOD PRESSURE (Patient taking differently: Take 1 tablet by mouth daily. )  . VENTOLIN HFA 108 (90 Base) MCG/ACT inhaler TAKE 2 PUFFS BY MOUTH EVERY 6 HOURS AS NEEDED FOR WHEEZE OR SHORTNESS OF BREATH (Patient taking differently: Inhale 2 puffs into the lungs every 6 (six) hours as needed for wheezing or shortness of breath. )  . vitamin E 400 UNIT capsule Take 400 Units by mouth daily.  Marland Kitchen amoxicillin-clavulanate (AUGMENTIN) 875-125 MG tablet Take 1 tablet by mouth 2 (two) times daily. (Patient not taking: Reported on 08/12/2018)  . predniSONE (STERAPRED UNI-PAK 21 TAB) 10 MG (21) TBPK tablet 6 day taper - take by mouth as directed for 6 days  . [DISCONTINUED] predniSONE (STERAPRED UNI-PAK 21 TAB) 10 MG (21) TBPK tablet 6 day taper - take by mouth as directed for 6 days (Patient not taking: Reported on 08/23/2018)   No facility-administered encounter medications on file as of 08/23/2018.  Surgical History: Past Surgical History:  Procedure Laterality Date  . APPENDECTOMY    . BACK SURGERY    . CHOLECYSTECTOMY    . HERNIA REPAIR      Medical History: Past Medical History:  Diagnosis Date  . Arthritis   . Carpal tunnel syndrome   . Complication of anesthesia    Difficulty waking up. Believes it took 2 days to wake up after her surgery.  . Depression   . Hyperlipidemia   . Hypertension   . Shortness of breath   . Stroke ([place])   .  Thyroid disease     Family History: Family History  Problem Relation Age of Onset  . Heart disease Mother   . Hypertension Mother   . Diverticulitis Mother   . Heart disease Father   . Hypertension Father     Social History   Socioeconomic History  . Marital status: Widowed    Spouse name: Not on file  . Number of children: Not on file  . Years of education: Not on file  . Highest education level: Not on file  Occupational History  . Not on file  Social Needs  . Financial resource strain: Somewhat hard  . Food insecurity:    Worry: Sometimes true    Inability: Sometimes true  . Transportation needs:    Medical: No    Non-medical: No  Tobacco Use  . Smoking status: Former Smoker    Types: Cigarettes    Last attempt to quit: 08/15/1992    Years since quitting: 26.0  . Smokeless tobacco: Never Used  Substance and Sexual Activity  . Alcohol use: No  . Drug use: No  . Sexual activity: Not Currently  Lifestyle  . Physical activity:    Days per week: 0 days    Minutes per session: 0 min  . Stress: Only a little  Relationships  . Social connections:    Talks on phone: More than three times a week    Gets together: More than three times a week    Attends religious service: More than 4 times per year    Active member of club or organization: Yes    Attends meetings of clubs or organizations: 1 to 4 times per year    Relationship status: Widowed  . Intimate partner violence:    Fear of current or ex partner: No    Emotionally abused: No    Physically abused: No    Forced sexual activity: No  Other Topics Concern  . Not on file  Social History Narrative  . Not on file      Review of Systems  Vital Signs: Resp 16   Ht 5\' 9"  (1.753 m)   Wt 220 lb (99.8 kg)   BMI 32.49 kg/m    Observation/Objective: Sounding good, appropriate affect.  Pt is speaking in complete sentences.     Assessment/Plan: 1. Pulmonary nodule, left CT chest ordered for patient to  evaluate left lower lobe nodules found approximately 6 months ago.   2. OSA (obstructive sleep apnea) Pt does not wear cpap.  We once again discussed the affects of untreated OsA.   3. Inflammatory polyarthropathy (HCC) Refilled patients prednisone, she is unable to have her carpal tunnel repaired due to canceling of procedures from covid 19.  - predniSONE (STERAPRED UNI-PAK 21 TAB) 10 MG (21) TBPK tablet; 6 day taper - take by mouth as directed for 6 days  Dispense: 21 tablet; Refill: 0  4. Shortness of  breath - CT Chest W Contrast; Future  5. Class 1 obesity due to excess calories with serious comorbidity and body mass index (BMI) of 32.0 to 32.9 in adult Obesity Counseling: Risk Assessment: An assessment of behavioral risk factors was made today and includes lack of exercise sedentary lifestyle, lack of portion control and poor dietary habits.  Risk Modification Advice: She was counseled on portion control guidelines. Restricting daily caloric intake to. . The detrimental long term effects of obesity on her health and ongoing poor compliance was also discussed with the patient.    General Counseling: soleil mas understanding of the findings of today's phone visit and agrees with plan of treatment. I have discussed any further diagnostic evaluation that may be needed or ordered today. We also reviewed her medications today. she has been encouraged to call the office with any questions or concerns that should arise related to todays visit.    Orders Placed This Encounter  Procedures  . CT Chest W Contrast    Meds ordered this encounter  Medications  . predniSONE (STERAPRED UNI-PAK 21 TAB) 10 MG (21) TBPK tablet    Sig: 6 day taper - take by mouth as directed for 6 days    Dispense:  21 tablet    Refill:  0    Time spent: [place] AGNP-C Internal medicine

## 2018-08-23 NOTE — Patient Instructions (Signed)
Pulmonary Nodule  A pulmonary nodule is tissue that has grown on your lung. A nodule may be cancer, but most nodules are not cancer.  Follow these instructions at home:     Take over-the-counter and prescription medicines only as told by your doctor.   Do not use any products that have nicotine or tobacco, such as cigarettes and e-cigarettes. If you need help quitting, ask your doctor.   Keep all follow-up visits as told by your doctor. This is important.  Contact a doctor if:   You have trouble breathing when doing activities.   You feel sick.   You feel more tired than normal.   You do not feel like eating.   You lose weight without trying.   You have chills.   You have night sweats.  Get help right away if:   You cannot catch your breath.   You start making whistling sounds when breathing (wheezing).   You cannot stop coughing.   You cough up blood.   You get dizzy.   You feel like you are going to pass out (faint).   You have sudden chest pain.   You have a fever or symptoms for more than 2-3 days.   You have a fever and your symptoms suddenly get worse.  Summary   A pulmonary nodule is tissue that has grown on your lung.   Most nodules are not cancer.   Your doctor will do tests to know what kind of nodule you have, and whether you need treatment for it.  This information is not intended to replace advice given to you by your health care provider. Make sure you discuss any questions you have with your health care provider.  Document Released: 05/31/2010 Document Revised: 05/27/2016 Document Reviewed: 05/27/2016  Elsevier Interactive Patient Education  2019 [employer]vier Inc[employer]

## 2018-09-23 ENCOUNTER — Other Ambulatory Visit: Payer: Self-pay | Admitting: Internal Medicine

## 2018-09-23 MED ORDER — TRIAMTERENE-HCTZ 37.5-25 MG PO TABS: ORAL_TABLET | ORAL | 1 refills | Status: DC

## 2018-10-13 ENCOUNTER — Ambulatory Visit
Admission: RE | Admit: 2018-10-13 | Discharge: 2018-10-13 | Disposition: A | Discharge status: Home / Self Care | Payer: Medicare Other | Source: Ambulatory Visit | Attending: Adult Health | Admitting: Adult Health

## 2018-10-13 ENCOUNTER — Other Ambulatory Visit: Payer: Self-pay

## 2018-10-13 DIAGNOSIS — R0602 Shortness of breath: Secondary | ICD-10-CM | POA: Insufficient documentation

## 2018-10-13 DIAGNOSIS — R918 Other nonspecific abnormal finding of lung field: Secondary | ICD-10-CM | POA: Diagnosis not present

## 2018-10-13 LAB — POCT I-STAT CREATININE: Creatinine, Ser: 1.3 mg/dL — ABNORMAL HIGH (ref 0.44–1.00)

## 2018-10-13 MED ORDER — IOHEXOL 300 MG/ML  SOLN
60.0000 mL | Freq: Once | INTRAMUSCULAR | Status: AC | PRN
  Administered 2018-10-13: 60 mL via INTRAVENOUS

## 2018-10-15 ENCOUNTER — Ambulatory Visit: Payer: Medicare Other | Admitting: Adult Health

## 2018-10-19 ENCOUNTER — Ambulatory Visit (INDEPENDENT_AMBULATORY_CARE_PROVIDER_SITE_OTHER): Payer: Medicare Other | Admitting: Internal Medicine

## 2018-10-19 ENCOUNTER — Encounter: Payer: Self-pay | Admitting: Internal Medicine

## 2018-10-19 ENCOUNTER — Other Ambulatory Visit: Payer: Self-pay

## 2018-10-19 VITALS — BP 157/62 | HR 82 | Resp 16 | Ht 67.0 in | Wt 230.0 lb

## 2018-10-19 DIAGNOSIS — M130 Polyarthritis, unspecified: Secondary | ICD-10-CM | POA: Diagnosis not present

## 2018-10-19 DIAGNOSIS — R918 Other nonspecific abnormal finding of lung field: Secondary | ICD-10-CM

## 2018-10-19 DIAGNOSIS — G4733 Obstructive sleep apnea (adult) (pediatric): Secondary | ICD-10-CM | POA: Diagnosis not present

## 2018-10-19 DIAGNOSIS — R0602 Shortness of breath: Secondary | ICD-10-CM | POA: Diagnosis not present

## 2018-10-19 NOTE — Patient Instructions (Signed)
Pulmonary Nodule  A pulmonary nodule is tissue that has grown on your lung. A nodule may be cancer, but most nodules are not cancer.  Follow these instructions at home:     Take over-the-counter and prescription medicines only as told by your doctor.   Do not use any products that have nicotine or tobacco, such as cigarettes and e-cigarettes. If you need help quitting, ask your doctor.   Keep all follow-up visits as told by your doctor. This is important.  Contact a doctor if:   You have trouble breathing when doing activities.   You feel sick.   You feel more tired than normal.   You do not feel like eating.   You lose weight without trying.   You have chills.   You have night sweats.  Get help right away if:   You cannot catch your breath.   You start making whistling sounds when breathing (wheezing).   You cannot stop coughing.   You cough up blood.   You get dizzy.   You feel like you are going to pass out (faint).   You have sudden chest pain.   You have a fever or symptoms for more than 2-3 days.   You have a fever and your symptoms suddenly get worse.  Summary   A pulmonary nodule is tissue that has grown on your lung.   Most nodules are not cancer.   Your doctor will do tests to know what kind of nodule you have, and whether you need treatment for it.  This information is not intended to replace advice given to you by your health care provider. Make sure you discuss any questions you have with your health care provider.  Document Released: 05/31/2010 Document Revised: 05/27/2016 Document Reviewed: 05/27/2016  Elsevier Interactive Patient Education  2019 [employer]vier Inc[employer]

## 2018-10-19 NOTE — Progress Notes (Signed)
[facility] [place], [place] [address]  Pulmonary Sleep Medicine   Office Visit Note  Patient Name: Christy Carter DOB: November 11, 1933 MRN [id number]  Date of Service: 10/19/2018  Complaints/HPI: Doing well . She had a CT scan done which shows the nodules are actually improving. Patient had a decrease in size from 42mm  Down to 40mm. Patient states no cough or congestion. No hemoptysis. Denies having CP noted. She states tha tshe did take a fall lost her balance. She states no oinjury. No LOC noted.   ROS  General: (-) fever, (-) chills, (-) night sweats, (-) weakness Skin: (-) rashes, (-) itching,. Eyes: (-) visual changes, (-) redness, (-) itching. Nose and Sinuses: (-) nasal stuffiness or itchiness, (-) postnasal drip, (-) nosebleeds, (-) sinus trouble. Mouth and Throat: (-) sore throat, (-) hoarseness. Neck: (-) swollen glands, (-) enlarged thyroid, (-) neck pain. Respiratory: - cough, (-) bloody sputum, + shortness of breath, - wheezing. Cardiovascular:  ankle swelling, (-) chest pain. Lymphatic: (-) lymph node enlargement. Neurologic: (-) numbness, (-) tingling. Psychiatric: (-) anxiety, (-) depression   Current Medication: Outpatient Encounter Medications as of 10/19/2018  Medication Sig  . celecoxib (CELEBREX) 200 MG capsule Take 1 capsule (200 mg total) by mouth daily.  . Cyanocobalamin 2500 MCG TABS Take 2,500 mcg by mouth daily as needed (lethargy).   Marland Kitchen diclofenac sodium (VOLTAREN) 1 % GEL Apply 2 g topically 4 (four) times daily. (Patient taking differently: Apply 2 g topically daily as needed (joint pain). )  . guaiFENesin-dextromethorphan (ROBITUSSIN DM) 100-10 MG/5ML syrup Take 5 mLs by mouth every 4 (four) hours as needed for cough.  Marland Kitchen HYDROcodone-acetaminophen (NORCO/VICODIN) 5-325 MG tablet Take 1 tablet by mouth every 4 (four) hours as needed for moderate pain.  Marland Kitchen ibuprofen (ADVIL,MOTRIN) 200 MG tablet Take 600 mg by mouth every 6 (six) hours  as needed for moderate pain.  Marland Kitchen levothyroxine (SYNTHROID, LEVOTHROID) 100 MCG tablet Take 1 tablet (100 mcg total) by mouth daily. (Patient taking differently: Take 100 mcg by mouth daily before breakfast. )  . omeprazole (PRILOSEC) 20 MG capsule TAKE 1 CAPSULE EVERY DAY  . omeprazole (PRILOSEC) 40 MG capsule Take 1 capsule (40 mg total) by mouth daily.  . predniSONE (STERAPRED UNI-PAK 21 TAB) 10 MG (21) TBPK tablet 6 day taper - take by mouth as directed for 6 days  . simvastatin (ZOCOR) 20 MG tablet TAKE 1 TABLET AT BEDTIME  . triamterene-hydrochlorothiazide (MAXZIDE-25) 37.5-25 MG tablet TAKE 1 TABLET EVERY DAY FOR BLOOD PRESSURE  . VENTOLIN HFA 108 (90 Base) MCG/ACT inhaler TAKE 2 PUFFS BY MOUTH EVERY 6 HOURS AS NEEDED FOR WHEEZE OR SHORTNESS OF BREATH (Patient taking differently: Inhale 2 puffs into the lungs every 6 (six) hours as needed for wheezing or shortness of breath. )  . vitamin E 400 UNIT capsule Take 400 Units by mouth daily.   No facility-administered encounter medications on file as of 10/19/2018.     Surgical History: Past Surgical History:  Procedure Laterality Date  . APPENDECTOMY    . BACK SURGERY    . CHOLECYSTECTOMY    . HERNIA REPAIR      Medical History: Past Medical History:  Diagnosis Date  . Arthritis   . Carpal tunnel syndrome   . Complication of anesthesia    Difficulty waking up. Believes it took 2 days to wake up after her surgery.  . Depression   . Hyperlipidemia   . Hypertension   . Shortness of  breath   . Stroke ([place])   . Thyroid disease     Family History: Family History  Problem Relation Age of Onset  . Heart disease Mother   . Hypertension Mother   . Diverticulitis Mother   . Heart disease Father   . Hypertension Father     Social History: Social History   Socioeconomic History  . Marital status: Widowed    Spouse name: Not on file  . Number of children: Not on file  . Years of education: Not on file  . Highest education  level: Not on file  Occupational History  . Not on file  Social Needs  . Financial resource strain: Somewhat hard  . Food insecurity:    Worry: Sometimes true    Inability: Sometimes true  . Transportation needs:    Medical: No    Non-medical: No  Tobacco Use  . Smoking status: Former Smoker    Types: Cigarettes    Last attempt to quit: 08/15/1992    Years since quitting: 26.1  . Smokeless tobacco: Never Used  Substance and Sexual Activity  . Alcohol use: No  . Drug use: No  . Sexual activity: Not Currently  Lifestyle  . Physical activity:    Days per week: 0 days    Minutes per session: 0 min  . Stress: Only a little  Relationships  . Social connections:    Talks on phone: More than three times a week    Gets together: More than three times a week    Attends religious service: More than 4 times per year    Active member of club or organization: Yes    Attends meetings of clubs or organizations: 1 to 4 times per year    Relationship status: Widowed  . Intimate partner violence:    Fear of current or ex partner: No    Emotionally abused: No    Physically abused: No    Forced sexual activity: No  Other Topics Concern  . Not on file  Social History Narrative  . Not on file    Vital Signs: Blood pressure (!) 157/62, pulse 82, resp. rate 16, height 5\' 7"  (1.702 m), weight 230 lb (104.3 kg), SpO2 97 %.  Examination: General Appearance: The patient is well-developed, well-nourished, and in no distress. Skin: Gross inspection of skin unremarkable. Head: normocephalic, no gross deformities. Eyes: no gross deformities noted. ENT: ears appear grossly normal no exudates. Neck: Supple. No thyromegaly. No LAD. Respiratory: No rhonchi noted at this time. Cardiovascular: Normal S1 and S2 without murmur or rub. Extremities: No cyanosis. pulses are equal. Neurologic: Alert and oriented. No involuntary movements.  LABS: Recent Results (from the past 2160 hour(s))  I-STAT  creatinine     Status: Abnormal   Collection Time: 10/13/18 12:42 PM  Result Value Ref Range   Creatinine, Ser 1.30 (H) 0.44 - 1.00 mg/dL    Radiology: Ct Chest W Contrast  Result Date: 10/13/2018 CLINICAL DATA:  Follow-up lung nodules EXAM: CT CHEST WITH CONTRAST TECHNIQUE: Multidetector CT imaging of the chest was performed during intravenous contrast administration. CONTRAST:  35mL OMNIPAQUE IOHEXOL 300 MG/ML  SOLN COMPARISON:  02/28/2018, 02/11/2018 FINDINGS: Cardiovascular: Severe, irregular atherosclerosis of the aorta. Three-vessel coronary artery calcifications. Normal heart size. No pericardial effusion. Mediastinum/Nodes: No enlarged mediastinal, hilar, or axillary lymph nodes. Thyroid gland, trachea, and esophagus demonstrate no significant findings. Lungs/Pleura: A previously seen 7 mm ground-glass pulmonary nodule of the right lower lobe is almost completely resolved, now  measuring 3 mm (series 3, image 79). There are numerous additional tiny centrilobular pulmonary nodules in an upper lobe predominant distribution, generally unchanged from prior (e.g. Series 3, image 24). There is bandlike scarring and volume loss of the right middle lobe and to a lesser extent, the lingula. No pleural effusion or pneumothorax. Upper Abdomen: No acute abnormality. Musculoskeletal: No chest wall mass or suspicious bone lesions identified. Disc degenerative disease and ankylosis of the thoracic spine. IMPRESSION: 1. A previously seen 7 mm ground-glass pulmonary nodule of the right lower lobe is almost completely resolved, now measuring 3 mm (series 3, image 79). There are numerous additional tiny centrilobular pulmonary nodules in an upper lobe predominant distribution, generally unchanged from prior (e.g. Series 3, image 24). There is bandlike scarring and volume loss of the right middle lobe and to a lesser extent, the lingula. Findings are consistent with atypical infection such as atypical mycobacterium or  inflammatory inhalational lung disease. There are no new suspicious pulmonary nodules. 2.  Coronary artery disease and severe aortic atherosclerosis. Electronically Signed   By: Eddie Candle M.D.   On: 10/13/2018 20:40    No results found.  Ct Chest W Contrast  Result Date: 10/13/2018 CLINICAL DATA:  Follow-up lung nodules EXAM: CT CHEST WITH CONTRAST TECHNIQUE: Multidetector CT imaging of the chest was performed during intravenous contrast administration. CONTRAST:  5mL OMNIPAQUE IOHEXOL 300 MG/ML  SOLN COMPARISON:  02/28/2018, 02/11/2018 FINDINGS: Cardiovascular: Severe, irregular atherosclerosis of the aorta. Three-vessel coronary artery calcifications. Normal heart size. No pericardial effusion. Mediastinum/Nodes: No enlarged mediastinal, hilar, or axillary lymph nodes. Thyroid gland, trachea, and esophagus demonstrate no significant findings. Lungs/Pleura: A previously seen 7 mm ground-glass pulmonary nodule of the right lower lobe is almost completely resolved, now measuring 3 mm (series 3, image 79). There are numerous additional tiny centrilobular pulmonary nodules in an upper lobe predominant distribution, generally unchanged from prior (e.g. Series 3, image 24). There is bandlike scarring and volume loss of the right middle lobe and to a lesser extent, the lingula. No pleural effusion or pneumothorax. Upper Abdomen: No acute abnormality. Musculoskeletal: No chest wall mass or suspicious bone lesions identified. Disc degenerative disease and ankylosis of the thoracic spine. IMPRESSION: 1. A previously seen 7 mm ground-glass pulmonary nodule of the right lower lobe is almost completely resolved, now measuring 3 mm (series 3, image 79). There are numerous additional tiny centrilobular pulmonary nodules in an upper lobe predominant distribution, generally unchanged from prior (e.g. Series 3, image 24). There is bandlike scarring and volume loss of the right middle lobe and to a lesser extent, the  lingula. Findings are consistent with atypical infection such as atypical mycobacterium or inflammatory inhalational lung disease. There are no new suspicious pulmonary nodules. 2.  Coronary artery disease and severe aortic atherosclerosis. Electronically Signed   By: Eddie Candle M.D.   On: 10/13/2018 20:40      Assessment and Plan: Patient Active Problem List   Diagnosis Date Noted  . Hematuria 08/02/2018  . Influenza A 03/02/2018  . Urinary tract infection without hematuria 03/02/2018  . Sepsis ([place]) 02/11/2018  . CAP (community acquired pneumonia) 02/11/2018  . CKD (chronic kidney disease), stage III ([place]) 02/11/2018  . Memory loss, short term 02/10/2018  . Flu vaccine need 02/10/2018  . Chest pain 02/03/2018  . Acute upper respiratory infection 02/03/2018  . Gastroesophageal reflux disease without esophagitis 02/03/2018  . Encounter for general adult medical examination with abnormal findings 01/13/2018  . Chronic low back pain  01/13/2018  . Need for vaccination against Streptococcus pneumoniae using pneumococcal conjugate vaccine 13 01/13/2018  . Dysuria 01/13/2018  . 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 Pelvic pain 10/08/2017  . Gout 06/15/2017  . Mixed hyperlipidemia 06/15/2017  . Inflammatory polyarthropathy ([place]) 06/15/2017  . Neuralgia and neuritis, unspecified 06/15/2017  . Major depressive disorder, recurrent, mild ([place]) 06/15/2017  . Other vitamin B12 deficiency anemias 06/15/2017  . Hypothyroidism 10/17/2008  . OVERWEIGHT/OBESITY 10/17/2008  . SLEEP APNEA, OBSTRUCTIVE 10/17/2008  . Essential hypertension 10/17/2008  . GALLSTONES 10/17/2008  . DYSPNEA 10/17/2008  . Overweight 10/17/2008    1. Pulmonary nodules appear to be resolving will continue with monitoring. Repeat Ct scan in 6 months. Will go ahead and order. 2. OSA not compliant discussed risks she feels OK however with out it 3. Arthritis stable will monitor 4. SOB at  baseline  General Counseling: I have discussed the findings of the evaluation and examination with Christy Carter.  I have also discussed any further diagnostic evaluation thatmay be needed or ordered today. Christy Carter verbalizes understanding of the findings of todays visit. We also reviewed her medications today and discussed drug interactions and side effects including but not limited excessive drowsiness and altered mental states. We also discussed that there is always a risk not just to her but also people around her. she has been encouraged to call the office with any questions or concerns that should arise related to todays visit.    Time spent: 53min  I have personally obtained a history, examined the patient, evaluated laboratory and imaging results, formulated the assessment and plan and placed orders.    Allyne Gee, MD [facility] Pulmonary and Critical Care Sleep medicine

## 2018-10-22 ENCOUNTER — Other Ambulatory Visit: Payer: Self-pay

## 2018-10-22 DIAGNOSIS — M064 Inflammatory polyarthropathy: Secondary | ICD-10-CM

## 2018-10-22 MED ORDER — PREDNISONE 10 MG (21) PO TBPK: ORAL_TABLET | ORAL | 0 refills | Status: DC

## 2018-10-22 MED ORDER — GABAPENTIN 100 MG PO CAPS: ORAL_CAPSULE | ORAL | 0 refills | Status: DC

## 2018-10-22 NOTE — Telephone Encounter (Signed)
Pt called that she having flare ups for gout as per heather send prednisone and gabapentin  And also advised pt if she is not feeling need to been seen

## 2018-11-01 ENCOUNTER — Other Ambulatory Visit: Payer: Self-pay

## 2018-11-01 DIAGNOSIS — M064 Inflammatory polyarthropathy: Secondary | ICD-10-CM

## 2018-11-01 MED ORDER — PREDNISONE 10 MG (21) PO TBPK: ORAL_TABLET | ORAL | 0 refills | Status: DC

## 2018-11-01 NOTE — Telephone Encounter (Signed)
Pt called she finished prednisone taper for her gout she want another around as per heather send and advised pt if she is not feeling better need to  been seen

## 2018-11-22 ENCOUNTER — Other Ambulatory Visit: Payer: Self-pay

## 2018-11-22 ENCOUNTER — Encounter: Payer: Self-pay | Admitting: Adult Health

## 2018-11-22 ENCOUNTER — Ambulatory Visit (INDEPENDENT_AMBULATORY_CARE_PROVIDER_SITE_OTHER): Payer: Medicare Other | Admitting: Adult Health

## 2018-11-22 VITALS — BP 148/58 | HR 88 | Temp 96.4°F | Resp 16 | Ht 67.0 in | Wt 230.0 lb

## 2018-11-22 DIAGNOSIS — L03031 Cellulitis of right toe: Secondary | ICD-10-CM | POA: Diagnosis not present

## 2018-11-22 DIAGNOSIS — M1000 Idiopathic gout, unspecified site: Secondary | ICD-10-CM

## 2018-11-22 DIAGNOSIS — M064 Inflammatory polyarthropathy: Secondary | ICD-10-CM

## 2018-11-22 DIAGNOSIS — K219 Gastro-esophageal reflux disease without esophagitis: Secondary | ICD-10-CM

## 2018-11-22 MED ORDER — SULFAMETHOXAZOLE-TRIMETHOPRIM 400-80 MG PO TABS: 1.0000 | ORAL_TABLET | Freq: Two times a day (BID) | ORAL | 0 refills | Status: DC

## 2018-11-22 MED ORDER — OMEPRAZOLE 40 MG PO CPDR: 40.0000 mg | DELAYED_RELEASE_CAPSULE | Freq: Every day | ORAL | 2 refills | Status: DC

## 2018-11-22 MED ORDER — PREDNISONE 10 MG PO TABS: ORAL_TABLET | ORAL | 0 refills | Status: DC

## 2018-11-22 NOTE — Progress Notes (Signed)
[facility] [place], [place] [address]  Internal MEDICINE  Office Visit Note  Patient Name: Christy Carter  [id number]  [id number]  Date of Service: 11/22/2018  Chief Complaint  Patient presents with  . Gout    on both feet. constant pain.     HPI Pt is here for a sick visit. Pt presents with 3 days of gout pain in both feet.  She reports she has been taking pain medicine for it with some relief.  She reports it is not getting any better.      Current Medication:  Outpatient Encounter Medications as of 11/22/2018  Medication Sig  . celecoxib (CELEBREX) 200 MG capsule Take 1 capsule (200 mg total) by mouth daily.  . Cyanocobalamin 2500 MCG TABS Take 2,500 mcg by mouth daily as needed (lethargy).   Marland Kitchen diclofenac sodium (VOLTAREN) 1 % GEL Apply 2 g topically 4 (four) times daily. (Patient taking differently: Apply 2 g topically daily as needed (joint pain). )  . gabapentin (NEURONTIN) 100 MG capsule Take 1 tab po twice a day for gout  . guaiFENesin-dextromethorphan (ROBITUSSIN DM) 100-10 MG/5ML syrup Take 5 mLs by mouth every 4 (four) hours as needed for cough.  Marland Kitchen HYDROcodone-acetaminophen (NORCO/VICODIN) 5-325 MG tablet Take 1 tablet by mouth every 4 (four) hours as needed for moderate pain.  Marland Kitchen ibuprofen (ADVIL,MOTRIN) 200 MG tablet Take 600 mg by mouth every 6 (six) hours as needed for moderate pain.  Marland Kitchen levothyroxine (SYNTHROID, LEVOTHROID) 100 MCG tablet Take 1 tablet (100 mcg total) by mouth daily. (Patient taking differently: Take 100 mcg by mouth daily before breakfast. )  . omeprazole (PRILOSEC) 40 MG capsule Take 1 capsule (40 mg total) by mouth daily.  . predniSONE (STERAPRED UNI-PAK 21 TAB) 10 MG (21) TBPK tablet 6 day taper - take by mouth as directed for 6 days  . simvastatin (ZOCOR) 20 MG tablet TAKE 1 TABLET AT BEDTIME  . triamterene-hydrochlorothiazide (MAXZIDE-25) 37.5-25 MG tablet TAKE 1 TABLET EVERY DAY FOR BLOOD PRESSURE  . VENTOLIN HFA  108 (90 Base) MCG/ACT inhaler TAKE 2 PUFFS BY MOUTH EVERY 6 HOURS AS NEEDED FOR WHEEZE OR SHORTNESS OF BREATH (Patient taking differently: Inhale 2 puffs into the lungs every 6 (six) hours as needed for wheezing or shortness of breath. )  . vitamin E 400 UNIT capsule Take 400 Units by mouth daily.  . [DISCONTINUED] omeprazole (PRILOSEC) 20 MG capsule TAKE 1 CAPSULE EVERY DAY (Patient not taking: Reported on 11/22/2018)   No facility-administered encounter medications on file as of 11/22/2018.       Medical History: Past Medical History:  Diagnosis Date  . Arthritis   . Carpal tunnel syndrome   . Complication of anesthesia    Difficulty waking up. Believes it took 2 days to wake up after her surgery.  . Depression   . Hyperlipidemia   . Hypertension   . Shortness of breath   . Stroke ([place])   . Thyroid disease      Vital Signs: BP (!) 148/58   Pulse 88   Temp (!) 96.4 F (35.8 C)   Resp 16   Ht 5\' 7"  (1.702 m)   Wt 230 lb (104.3 kg)   SpO2 99%   BMI 36.02 kg/m    Review of Systems  Constitutional: Negative for chills, fatigue and unexpected weight change.  HENT: Negative for congestion, rhinorrhea, sneezing and sore throat.   Eyes: Negative for photophobia, pain and redness.  Respiratory: Negative  for cough, chest tightness and shortness of breath.   Cardiovascular: Negative for chest pain and palpitations.  Gastrointestinal: Negative for abdominal pain, constipation, diarrhea, nausea and vomiting.  Endocrine: Negative.   Genitourinary: Negative for dysuria and frequency.  Musculoskeletal: Negative for arthralgias, back pain, joint swelling and neck pain.  Skin: Negative for rash.  Allergic/Immunologic: Negative.   Neurological: Negative for tremors and numbness.  Hematological: Negative for adenopathy. Does not bruise/bleed easily.  Psychiatric/Behavioral: Negative for behavioral problems and sleep disturbance. The patient is not nervous/anxious.     Physical  Exam Vitals signs and nursing note reviewed.  Constitutional:      General: She is not in acute distress.    Appearance: She is well-developed. She is not diaphoretic.  HENT:     Head: Normocephalic and atraumatic.     Mouth/Throat:     Pharynx: No oropharyngeal exudate.  Eyes:     Pupils: Pupils are equal, round, and reactive to light.  Neck:     Musculoskeletal: Normal range of motion and neck supple.     Thyroid: No thyromegaly.     Vascular: No JVD.     Trachea: No tracheal deviation.  Cardiovascular:     Rate and Rhythm: Normal rate and regular rhythm.     Heart sounds: Normal heart sounds. No murmur. No friction rub. No gallop.   Pulmonary:     Effort: Pulmonary effort is normal. No respiratory distress.     Breath sounds: Normal breath sounds. No wheezing or rales.  Chest:     Chest wall: No tenderness.  Abdominal:     Palpations: Abdomen is soft.     Tenderness: There is no abdominal tenderness. There is no guarding.  Musculoskeletal: Normal range of motion.  Lymphadenopathy:     Cervical: No cervical adenopathy.  Skin:    General: Skin is warm and dry.  Neurological:     Mental Status: She is alert and oriented to person, place, and time.     Cranial Nerves: No cranial nerve deficit.  Psychiatric:        Behavior: Behavior normal.        Thought Content: Thought content normal.        Judgment: Judgment normal.    Assessment/Plan: 1. Inflammatory polyarthropathy (HCC) Take prednisone as directed.  RTC if sympotoms worsen or fail to improve.  2. Cellulitis of toe of right foot Use Bactrim as directed. Advised patient to take entire course of antibiotics as prescribed with food. Pt should return to clinic in 7-10 days if symptoms fail to improve or new symptoms develop.   3. Gastroesophageal reflux disease without esophagitis Stable, continue prilosec  4. Acute idiopathic gout, unspecified site - Uric acid General Counseling: Christy Carter verbalizes understanding  of the findings of todays visit and agrees with plan of treatment. I have discussed any further diagnostic evaluation that may be needed or ordered today. We also reviewed her medications today. she has been encouraged to call the office with any questions or concerns that should arise related to todays visit.   No orders of the defined types were placed in this encounter.   No orders of the defined types were placed in this encounter.   Time spent: 20 Minutes  This patient was seen by Orson Gear AGNP-C in Collaboration with Dr Lavera Guise as a part of collaborative care agreement.  Kendell Bane AGNP-C Internal Medicine

## 2018-11-23 ENCOUNTER — Telehealth: Payer: Self-pay | Admitting: Adult Health

## 2018-11-24 ENCOUNTER — Other Ambulatory Visit: Payer: Self-pay | Admitting: Adult Health

## 2018-11-24 ENCOUNTER — Other Ambulatory Visit: Payer: Self-pay

## 2018-11-24 DIAGNOSIS — L03116 Cellulitis of left lower limb: Secondary | ICD-10-CM

## 2018-11-24 DIAGNOSIS — K122 Cellulitis and abscess of mouth: Secondary | ICD-10-CM

## 2018-11-24 DIAGNOSIS — K219 Gastro-esophageal reflux disease without esophagitis: Secondary | ICD-10-CM

## 2018-11-24 DIAGNOSIS — L03115 Cellulitis of right lower limb: Secondary | ICD-10-CM

## 2018-11-24 MED ORDER — OMEPRAZOLE 40 MG PO CPDR: 40.0000 mg | DELAYED_RELEASE_CAPSULE | Freq: Every day | ORAL | 2 refills | Status: DC

## 2018-11-24 NOTE — Progress Notes (Signed)
Labs resent to lab copr.

## 2018-11-25 DIAGNOSIS — L03116 Cellulitis of left lower limb: Secondary | ICD-10-CM | POA: Diagnosis not present

## 2018-11-25 DIAGNOSIS — L03115 Cellulitis of right lower limb: Secondary | ICD-10-CM | POA: Diagnosis not present

## 2018-11-26 LAB — COMPREHENSIVE METABOLIC PANEL
ALT: 12 IU/L (ref 0–32)
AST: 14 IU/L (ref 0–40)
Albumin/Globulin Ratio: 1.5 (ref 1.2–2.2)
Albumin: 3.9 g/dL (ref 3.6–4.6)
Alkaline Phosphatase: 90 IU/L (ref 39–117)
BUN/Creatinine Ratio: 24 (ref 12–28)
BUN: 34 mg/dL — ABNORMAL HIGH (ref 8–27)
Bilirubin Total: 0.2 mg/dL (ref 0.0–1.2)
CO2: 24 mmol/L (ref 20–29)
Calcium: 9.2 mg/dL (ref 8.7–10.3)
Chloride: 101 mmol/L (ref 96–106)
Creatinine, Ser: 1.43 mg/dL — ABNORMAL HIGH (ref 0.57–1.00)
GFR calc Af Amer: 39 mL/min/{1.73_m2} — ABNORMAL LOW (ref >59)
GFR calc non Af Amer: 34 mL/min/{1.73_m2} — ABNORMAL LOW (ref >59)
Globulin, Total: 2.6 g/dL (ref 1.5–4.5)
Glucose: 112 mg/dL — ABNORMAL HIGH (ref 65–99)
Potassium: 4.7 mmol/L (ref 3.5–5.2)
Sodium: 143 mmol/L (ref 134–144)
Total Protein: 6.5 g/dL (ref 6.0–8.5)

## 2018-11-26 LAB — URIC ACID: Uric Acid: 7.8 mg/dL — ABNORMAL HIGH (ref 2.5–7.1)

## 2018-11-29 ENCOUNTER — Telehealth: Payer: Self-pay

## 2018-11-29 ENCOUNTER — Other Ambulatory Visit: Payer: Self-pay | Admitting: Internal Medicine

## 2018-11-29 MED ORDER — ALLOPURINOL 100 MG PO TABS: ORAL_TABLET | ORAL | 6 refills | Status: DC

## 2018-11-29 NOTE — Telephone Encounter (Signed)
Labs show worsening kidney functions and gout. Pt was notified of this and that an rx of allopurinol was sent for her. 2 week fu was scheduled.

## 2018-11-29 NOTE — Progress Notes (Signed)
RX for allopurinol is sent

## 2018-11-30 NOTE — Telephone Encounter (Signed)
Pt advised krupa for labs result

## 2018-12-06 ENCOUNTER — Ambulatory Visit (INDEPENDENT_AMBULATORY_CARE_PROVIDER_SITE_OTHER): Payer: Medicare Other | Admitting: Nurse Practitioner

## 2018-12-06 ENCOUNTER — Encounter: Payer: Self-pay | Admitting: Nurse Practitioner

## 2018-12-06 ENCOUNTER — Other Ambulatory Visit: Payer: Self-pay

## 2018-12-06 VITALS — BP 144/53 | HR 79 | Resp 16 | Ht 67.0 in | Wt 229.0 lb

## 2018-12-06 DIAGNOSIS — M1000 Idiopathic gout, unspecified site: Secondary | ICD-10-CM

## 2018-12-06 DIAGNOSIS — M064 Inflammatory polyarthropathy: Secondary | ICD-10-CM

## 2018-12-06 DIAGNOSIS — I10 Essential (primary) hypertension: Secondary | ICD-10-CM

## 2018-12-06 DIAGNOSIS — M79672 Pain in left foot: Secondary | ICD-10-CM | POA: Diagnosis not present

## 2018-12-06 MED ORDER — PREDNISONE 10 MG (21) PO TBPK: ORAL_TABLET | ORAL | 0 refills | Status: DC

## 2018-12-06 MED ORDER — HYDROCODONE-ACETAMINOPHEN 5-325 MG PO TABS: 1.0000 | ORAL_TABLET | Freq: Three times a day (TID) | ORAL | 0 refills | Status: DC | PRN

## 2018-12-06 MED ORDER — COLCHICINE 0.6 MG PO TABS: 0.6000 mg | ORAL_TABLET | Freq: Two times a day (BID) | ORAL | 1 refills | Status: DC

## 2018-12-06 NOTE — Progress Notes (Signed)
[facility] [place], [place] [address]  Internal MEDICINE  Office Visit Note  Patient Name: Christy Carter  [id number]  [id number]  Date of Service: 12/19/2018  Chief Complaint  Patient presents with  . Follow-up  . Arthritis  . Depression  . Hypertension  . Hyperlipidemia  . Anxiety  . Thyroid Problem  . Foot Swelling    The patient is here for follow up of gout. Has severe redness, swelling, and pain of the left foot and and to a lesser extent on the right. She was seen for this at last visit. Medication did not work. Pain and swelling have gotten worse. She states that pain is so severe.that she is unable to stand up for longer than a minute or so.       Current Medication: Outpatient Encounter Medications as of 12/06/2018  Medication Sig  . allopurinol (ZYLOPRIM) 100 MG tablet Take 2 tabs at bed time for gout  . celecoxib (CELEBREX) 200 MG capsule Take 1 capsule (200 mg total) by mouth daily.  Marland Kitchen ibuprofen (ADVIL) 200 MG tablet Take 200 mg by mouth every 6 (six) hours as needed.  Marland Kitchen levothyroxine (SYNTHROID, LEVOTHROID) 100 MCG tablet Take 1 tablet (100 mcg total) by mouth daily. (Patient taking differently: Take 100 mcg by mouth daily before breakfast. )  . omeprazole (PRILOSEC) 40 MG capsule Take 1 capsule (40 mg total) by mouth daily.  Marland Kitchen triamterene-hydrochlorothiazide (MAXZIDE-25) 37.5-25 MG tablet TAKE 1 TABLET EVERY DAY FOR BLOOD PRESSURE  . VENTOLIN HFA 108 (90 Base) MCG/ACT inhaler TAKE 2 PUFFS BY MOUTH EVERY 6 HOURS AS NEEDED FOR WHEEZE OR SHORTNESS OF BREATH  . vitamin E 400 UNIT capsule Take 400 Units by mouth daily.  . [DISCONTINUED] gabapentin (NEURONTIN) 100 MG capsule Take 1 tab po twice a day for gout  . colchicine 0.6 MG tablet Take 1 tablet (0.6 mg total) by mouth 2 (two) times daily.  Marland Kitchen HYDROcodone-acetaminophen (NORCO/VICODIN) 5-325 MG tablet Take 1 tablet by mouth 3 (three) times daily as needed for moderate pain.  . predniSONE  (STERAPRED UNI-PAK 21 TAB) 10 MG (21) TBPK tablet 6 day taper - take by mouth as directed for 6 days  . [DISCONTINUED] Cyanocobalamin 2500 MCG TABS Take 2,500 mcg by mouth daily as needed (lethargy).   . [DISCONTINUED] diclofenac sodium (VOLTAREN) 1 % GEL Apply 2 g topically 4 (four) times daily. (Patient not taking: Reported on 12/06/2018)  . [DISCONTINUED] guaiFENesin-dextromethorphan (ROBITUSSIN DM) 100-10 MG/5ML syrup Take 5 mLs by mouth every 4 (four) hours as needed for cough. (Patient not taking: Reported on 12/06/2018)  . [DISCONTINUED] HYDROcodone-acetaminophen (NORCO/VICODIN) 5-325 MG tablet Take 1 tablet by mouth every 4 (four) hours as needed for moderate pain. (Patient not taking: Reported on 12/06/2018)  . [DISCONTINUED] ibuprofen (ADVIL,MOTRIN) 200 MG tablet Take 600 mg by mouth every 6 (six) hours as needed for moderate pain.  . [DISCONTINUED] predniSONE (DELTASONE) 10 MG tablet Use per dose pack (Patient not taking: Reported on 12/06/2018)  . [DISCONTINUED] predniSONE (STERAPRED UNI-PAK 21 TAB) 10 MG (21) TBPK tablet 6 day taper - take by mouth as directed for 6 days (Patient not taking: Reported on 12/06/2018)  . [DISCONTINUED] simvastatin (ZOCOR) 20 MG tablet TAKE 1 TABLET AT BEDTIME (Patient not taking: Reported on 12/06/2018)  . [DISCONTINUED] sulfamethoxazole-trimethoprim (BACTRIM) 400-80 MG tablet Take 1 tablet by mouth 2 (two) times daily. (Patient not taking: Reported on 12/06/2018)   No facility-administered encounter medications on file as of 12/06/2018.  Surgical History: Past Surgical History:  Procedure Laterality Date  . APPENDECTOMY    . BACK SURGERY    . CHOLECYSTECTOMY    . HERNIA REPAIR      Medical History: Past Medical History:  Diagnosis Date  . Arthritis   . Carpal tunnel syndrome   . Complication of anesthesia    Difficulty waking up. Believes it took 2 days to wake up after her surgery.  . Depression   . Hyperlipidemia   . Hypertension   .  Shortness of breath   . Stroke ([facility])   . Thyroid disease     Family History: Family History  Problem Relation Age of Onset  . Heart disease Mother   . Hypertension Mother   . Diverticulitis Mother   . Heart disease Father   . Hypertension Father     Social History   Socioeconomic History  . Marital status: Widowed    Spouse name: Not on file  . Number of children: Not on file  . Years of education: Not on file  . Highest education level: Not on file  Occupational History  . Not on file  Social Needs  . Financial resource strain: Somewhat hard  . Food insecurity    Worry: Sometimes true    Inability: Sometimes true  . Transportation needs    Medical: No    Non-medical: No  Tobacco Use  . Smoking status: Former Smoker    Types: Cigarettes    Quit date: 08/15/1992    Years since quitting: 26.3  . Smokeless tobacco: Never Used  Substance and Sexual Activity  . Alcohol use: No  . Drug use: No  . Sexual activity: Not Currently  Lifestyle  . Physical activity    Days per week: 0 days    Minutes per session: 0 min  . Stress: Only a little  Relationships  . Social connections    Talks on phone: More than three times a week    Gets together: More than three times a week    Attends religious service: More than 4 times per year    Active member of club or organization: Yes    Attends meetings of clubs or organizations: 1 to 4 times per year    Relationship status: Widowed  . Intimate partner violence    Fear of current or ex partner: No    Emotionally abused: No    Physically abused: No    Forced sexual activity: No  Other Topics Concern  . Not on file  Social History Narrative  . Not on file      Review of Systems  Constitutional: Positive for activity change. Negative for chills, fatigue and unexpected weight change.  HENT: Negative for congestion, rhinorrhea, sneezing and sore throat.   Respiratory: Negative for cough, chest tightness and shortness of  breath.   Cardiovascular: Negative for chest pain and palpitations.  Gastrointestinal: Negative for abdominal pain, constipation, diarrhea, nausea and vomiting.  Musculoskeletal: Positive for arthralgias and gait problem. Negative for back pain, joint swelling and neck pain.       Pain and swelling of left foot and some on right foot. Pain is severe. States that she is unable to stand up longer than a minute or so.   Skin: Negative for rash.  Neurological: Negative for dizziness, tremors, numbness and headaches.  Hematological: Negative for adenopathy. Does not bruise/bleed easily.  Psychiatric/Behavioral: Negative for behavioral problems and sleep disturbance. The patient is not nervous/anxious.  Today's Vitals   12/06/18 1204  BP: (!) 144/53  Pulse: 79  Resp: 16  SpO2: 97%  Weight: 229 lb (103.9 kg)  Height: 5\' 7"  (1.702 m)   Body mass index is 35.87 kg/m.  Physical Exam Vitals signs and nursing note reviewed.  Constitutional:      General: She is not in acute distress.    Appearance: Normal appearance. She is well-developed. She is not diaphoretic.  HENT:     Head: Normocephalic and atraumatic.     Mouth/Throat:     Pharynx: No oropharyngeal exudate.  Eyes:     Pupils: Pupils are equal, round, and reactive to light.  Neck:     Musculoskeletal: Normal range of motion and neck supple.     Thyroid: No thyromegaly.     Vascular: No JVD.     Trachea: No tracheal deviation.  Cardiovascular:     Rate and Rhythm: Normal rate and regular rhythm.     Heart sounds: Normal heart sounds. No murmur. No friction rub. No gallop.   Pulmonary:     Effort: Pulmonary effort is normal. No respiratory distress.     Breath sounds: Normal breath sounds. No wheezing or rales.  Chest:     Chest wall: No tenderness.  Abdominal:     Palpations: Abdomen is soft.     Tenderness: There is no abdominal tenderness. There is no guarding.  Musculoskeletal:        General: Swelling and  tenderness present.     Comments: There is pain, swelling, and redness of the left foot from the toes to the ankle. Foot is red and swollen and warm to touch. ROM and strength are reduced due to pain and swelling .  Lymphadenopathy:     Cervical: No cervical adenopathy.  Skin:    General: Skin is warm and dry.  Neurological:     Mental Status: She is alert and oriented to person, place, and time.     Cranial Nerves: No cranial nerve deficit.  Psychiatric:        Behavior: Behavior normal.        Thought Content: Thought content normal.        Judgment: Judgment normal.   Assessment/Plan: 1. Acute idiopathic gout, unspecified site Start colchicine 0.6mg  tablets. Take twice daily for next 5 days. Continue allopurinol daily to prevent further gout flares.  - colchicine 0.6 MG tablet; Take 1 tablet (0.6 mg total) by mouth 2 (two) times daily.  Dispense: 10 tablet; Refill: 1  2. Pain in left foot Short term prescription for hydrocodone/APAP 5/325mg  tablets which may be taken up to three times daily if needed for severe pain. Reviewed risks and possible side effects associated with taking opiates, benzodiazepines and other CNS depressants. Combination of these could cause dizziness and drowsiness. Advised patient not to drive or operate machinery when taking these medications, as patient's and other's life can be at risk and will have consequences. Patient verbalized understanding in this matter. Dependence and abuse for these drugs will be monitored closely. A Controlled substance policy and procedure is on file which allows [facility] to order a urine drug screen test at any visit. Patient understands and agrees with the plan - HYDROcodone-acetaminophen (NORCO/VICODIN) 5-325 MG tablet; Take 1 tablet by mouth 3 (three) times daily as needed for moderate pain.  Dispense: 15 tablet; Refill: 0  3. Inflammatory polyarthropathy (HCC) Prednisone taper ordered. Take as directed for 6 days.   - predniSONE (STERAPRED UNI-PAK  21 TAB) 10 MG (21) TBPK tablet; 6 day taper - take by mouth as directed for 6 days  Dispense: 21 tablet; Refill: 0  4. Essential hypertension Continue bp medications as prescribed.   General Counseling: arlesia kiel understanding of the findings of todays visit and agrees with plan of treatment. I have discussed any further diagnostic evaluation that may be needed or ordered today. We also reviewed her medications today. she has been encouraged to call the office with any questions or concerns that should arise related to todays visit.  This patient was seen by [place] with Dr Lavera Guise as a part of collaborative care agreement  Meds ordered this encounter  Medications  . colchicine 0.6 MG tablet    Sig: Take 1 tablet (0.6 mg total) by mouth 2 (two) times daily.    Dispense:  10 tablet    Refill:  1    Order Specific Question:   Supervising Provider    Answer:   Lavera Guise [[id number]]  . predniSONE (STERAPRED UNI-PAK 21 TAB) 10 MG (21) TBPK tablet    Sig: 6 day taper - take by mouth as directed for 6 days    Dispense:  21 tablet    Refill:  0    Order Specific Question:   Supervising Provider    Answer:   Lavera Guise [place]  . HYDROcodone-acetaminophen (NORCO/VICODIN) 5-325 MG tablet    Sig: Take 1 tablet by mouth 3 (three) times daily as needed for moderate pain.    Dispense:  15 tablet    Refill:  0    Order Specific Question:   Supervising Provider    Answer:   Lavera Guise [[id number]]    Time spent: 64 Minutes      Dr Lavera Guise Internal medicine

## 2018-12-17 ENCOUNTER — Other Ambulatory Visit: Payer: Self-pay

## 2018-12-17 MED ORDER — GABAPENTIN 100 MG PO CAPS: ORAL_CAPSULE | ORAL | 0 refills | Status: DC

## 2018-12-19 DIAGNOSIS — M79672 Pain in left foot: Secondary | ICD-10-CM | POA: Insufficient documentation

## 2019-01-11 ENCOUNTER — Ambulatory Visit: Payer: Self-pay | Admitting: Nurse Practitioner

## 2019-01-12 ENCOUNTER — Other Ambulatory Visit: Payer: Self-pay

## 2019-01-12 ENCOUNTER — Ambulatory Visit (INDEPENDENT_AMBULATORY_CARE_PROVIDER_SITE_OTHER): Payer: Medicare Other | Admitting: Nurse Practitioner

## 2019-01-12 ENCOUNTER — Encounter: Payer: Self-pay | Admitting: Nurse Practitioner

## 2019-01-12 VITALS — BP 144/65 | HR 79 | Resp 16 | Ht 67.0 in | Wt 229.0 lb

## 2019-01-12 DIAGNOSIS — I10 Essential (primary) hypertension: Secondary | ICD-10-CM

## 2019-01-12 DIAGNOSIS — Z0001 Encounter for general adult medical examination with abnormal findings: Secondary | ICD-10-CM | POA: Diagnosis not present

## 2019-01-12 DIAGNOSIS — R300 Dysuria: Secondary | ICD-10-CM

## 2019-01-12 DIAGNOSIS — M064 Inflammatory polyarthropathy: Secondary | ICD-10-CM

## 2019-01-12 DIAGNOSIS — M1000 Idiopathic gout, unspecified site: Secondary | ICD-10-CM | POA: Diagnosis not present

## 2019-01-12 DIAGNOSIS — M79672 Pain in left foot: Secondary | ICD-10-CM | POA: Diagnosis not present

## 2019-01-12 DIAGNOSIS — N289 Disorder of kidney and ureter, unspecified: Secondary | ICD-10-CM

## 2019-01-12 DIAGNOSIS — Z23 Encounter for immunization: Secondary | ICD-10-CM

## 2019-01-12 MED ORDER — PNEUMOVAX 23 25 MCG/0.5ML IJ INJ: INJECTION | INTRAMUSCULAR | 0 refills | Status: DC

## 2019-01-12 MED ORDER — HYDROCODONE-ACETAMINOPHEN 5-325 MG PO TABS: 1.0000 | ORAL_TABLET | Freq: Three times a day (TID) | ORAL | 0 refills | Status: DC | PRN

## 2019-01-12 MED ORDER — BISOPROLOL-HYDROCHLOROTHIAZIDE 2.5-6.25 MG PO TABS: 1.0000 | ORAL_TABLET | Freq: Every day | ORAL | 3 refills | Status: DC

## 2019-01-12 MED ORDER — ALLOPURINOL 100 MG PO TABS: ORAL_TABLET | ORAL | 6 refills | Status: DC

## 2019-01-12 NOTE — Progress Notes (Signed)
[facility] [place], [place] [address]  Internal MEDICINE  Office Visit Note  Patient Name: CORNELIUS DIANE  [id number]  [id number]  Date of Service: 01/19/2019   Pt is here for routine health maintenance examination  Chief Complaint  Patient presents with  . Annual Exam  . Hypertension  . Gout  . Quality Metric Gaps    pna vacc  . Hyperlipidemia     The patient is here for health maintenance exam. She continues to have issues with gout. Has moderate redness, swelling, and pain of the left foot and and to a lesser extent on the right. She was seen for this at last two visits. Medication did not work. Pain and swelling have improved. She did have labs. Uric acid levels and renal functions were both elevated. She is doing well otherwise. Blood pressure is generally well controlled. She also needs to have pneumovax vaccine.      Current Medication: Outpatient Encounter Medications as of 01/12/2019  Medication Sig Note  . allopurinol (ZYLOPRIM) 100 MG tablet Take 2 tabs at bed time for gout   . celecoxib (CELEBREX) 200 MG capsule Take 1 capsule (200 mg total) by mouth daily.   . colchicine 0.6 MG tablet Take 1 tablet (0.6 mg total) by mouth 2 (two) times daily.   Marland Kitchen gabapentin (NEURONTIN) 100 MG capsule Take 1 tab po twice a day for gout   . HYDROcodone-acetaminophen (NORCO/VICODIN) 5-325 MG tablet Take 1 tablet by mouth 3 (three) times daily as needed for moderate pain.   Marland Kitchen ibuprofen (ADVIL) 200 MG tablet Take 200 mg by mouth every 6 (six) hours as needed.   Marland Kitchen levothyroxine (SYNTHROID, LEVOTHROID) 100 MCG tablet Take 1 tablet (100 mcg total) by mouth daily. (Patient taking differently: Take 100 mcg by mouth daily before breakfast. )   . omeprazole (PRILOSEC) 40 MG capsule Take 1 capsule (40 mg total) by mouth daily.   . predniSONE (STERAPRED UNI-PAK 21 TAB) 10 MG (21) TBPK tablet 6 day taper - take by mouth as directed for 6 days   . VENTOLIN HFA 108 (90  Base) MCG/ACT inhaler TAKE 2 PUFFS BY MOUTH EVERY 6 HOURS AS NEEDED FOR WHEEZE OR SHORTNESS OF BREATH   . vitamin E 400 UNIT capsule Take 400 Units by mouth daily.   . [DISCONTINUED] allopurinol (ZYLOPRIM) 100 MG tablet Take 2 tabs at bed time for gout   . [DISCONTINUED] HYDROcodone-acetaminophen (NORCO/VICODIN) 5-325 MG tablet Take 1 tablet by mouth 3 (three) times daily as needed for moderate pain.   . [DISCONTINUED] triamterene-hydrochlorothiazide (MAXZIDE-25) 37.5-25 MG tablet TAKE 1 TABLET EVERY DAY FOR BLOOD PRESSURE 01/12/2019: elevated renal functoins and increased uric acid levels.   . bisoprolol-hydrochlorothiazide (ZIAC) 2.5-6.25 MG tablet Take 1 tablet by mouth daily.   . pneumococcal 23 valent vaccine (PNEUMOVAX 23) 25 MCG/0.5ML injection Inject 0.99ml IM once    No facility-administered encounter medications on file as of 01/12/2019.     Surgical History: Past Surgical History:  Procedure Laterality Date  . APPENDECTOMY    . BACK SURGERY    . CHOLECYSTECTOMY    . HERNIA REPAIR      Medical History: Past Medical History:  Diagnosis Date  . Arthritis   . Carpal tunnel syndrome   . Complication of anesthesia    Difficulty waking up. Believes it took 2 days to wake up after her surgery.  . Depression   . Hyperlipidemia   . Hypertension   . Shortness of  breath   . Stroke ([place])   . Thyroid disease     Family History: Family History  Problem Relation Age of Onset  . Heart disease Mother   . Hypertension Mother   . Diverticulitis Mother   . Heart disease Father   . Hypertension Father       Review of Systems  Constitutional: Positive for activity change. Negative for chills, fatigue and unexpected weight change.  HENT: Negative for congestion, rhinorrhea, sneezing and sore throat.   Respiratory: Negative for cough, chest tightness and shortness of breath.   Cardiovascular: Negative for chest pain and palpitations.  Gastrointestinal: Negative for abdominal pain,  constipation, diarrhea, nausea and vomiting.  Musculoskeletal: Positive for arthralgias and gait problem. Negative for back pain, joint swelling and neck pain.       Pain and swelling of left foot and some on right foot. Pain is moderate and improved.   Skin: Negative for rash.  Neurological: Negative for dizziness, tremors, numbness and headaches.  Hematological: Negative for adenopathy. Does not bruise/bleed easily.  Psychiatric/Behavioral: Negative for behavioral problems and sleep disturbance. The patient is not nervous/anxious.      Today's Vitals   01/12/19 0910  BP: (!) 144/65  Pulse: 79  Resp: 16  SpO2: 96%  Weight: 229 lb (103.9 kg)  Height: 5\' 7"  (1.702 m)   Body mass index is 35.87 kg/m.  Physical Exam Vitals signs and nursing note reviewed.  Constitutional:      General: She is not in acute distress.    Appearance: Normal appearance. She is well-developed. She is not diaphoretic.  HENT:     Head: Normocephalic and atraumatic.     Mouth/Throat:     Pharynx: No oropharyngeal exudate.  Eyes:     Pupils: Pupils are equal, round, and reactive to light.  Neck:     Musculoskeletal: Normal range of motion and neck supple.     Thyroid: No thyromegaly.     Vascular: No JVD.     Trachea: No tracheal deviation.  Cardiovascular:     Rate and Rhythm: Normal rate and regular rhythm.     Heart sounds: Normal heart sounds. No murmur. No friction rub. No gallop.   Pulmonary:     Effort: Pulmonary effort is normal. No respiratory distress.     Breath sounds: Normal breath sounds. No wheezing or rales.  Chest:     Chest wall: No tenderness.     Breasts:        Right: Normal. No swelling, bleeding, inverted nipple, mass, nipple discharge, skin change or tenderness.        Left: Normal. No swelling, bleeding, inverted nipple, mass, nipple discharge, skin change or tenderness.  Abdominal:     Palpations: Abdomen is soft.     Tenderness: There is no abdominal tenderness. There  is no guarding.  Musculoskeletal:        General: Swelling and tenderness present.     Comments: There is pain, swelling, and redness of the left foot from the toes to the ankle. Foot is red and swollen and warm to touch. ROM and strength are reduced due to pain and swelling. This is improved since her most recent visit .  Lymphadenopathy:     Cervical: No cervical adenopathy.  Skin:    General: Skin is warm and dry.  Neurological:     Mental Status: She is alert and oriented to person, place, and time.     Cranial Nerves: No cranial nerve deficit.  Psychiatric:        Behavior: Behavior normal.        Thought Content: Thought content normal.        Judgment: Judgment normal.    Depression screen [facility] 2/9 01/12/2019 03/26/2018 03/01/2018 01/26/2018 12/15/2017  Decreased Interest 0 0 0 0 0  Down, Depressed, Hopeless 0 0 0 0 0  PHQ - 2 Score 0 0 0 0 0    Functional Status Survey: Is the patient deaf or have difficulty hearing?: No Does the patient have difficulty seeing, even when wearing glasses/contacts?: No Does the patient have difficulty concentrating, remembering, or making decisions?: No Does the patient have difficulty walking or climbing stairs?: Yes(walk with a cane and walker) Does the patient have difficulty dressing or bathing?: No Does the patient have difficulty doing errands alone such as visiting a doctor's office or shopping?: Yes(due to mobility)  MMSE - [place] Exam 01/12/2019 02/10/2018 02/10/2018  Orientation to time 5 5 5   Orientation to Place 5 5 5   Registration 3 3 3   Attention/ Calculation 5 5 5   Recall 3 3 3   Language- name 2 objects 2 2 2   Language- repeat 1 1 1   Language- follow 3 step command 3 3 3   Language- read & follow direction 1 1 -  Write a sentence 1 1 1   Copy design 1 1 1   Total score 30 30 -    Fall Risk  01/12/2019 07/21/2018 03/26/2018 03/01/2018 01/26/2018  Falls in the past year? 0 0 0 No Yes  Number falls in past yr: - - - - 2 or  more  Injury with Fall? - - - - No      LABS: Recent Results (from the past 2160 hour(s))  Uric acid     Status: Abnormal   Collection Time: 11/25/18 10:46 AM  Result Value Ref Range   Uric Acid 7.8 (H) 2.5 - 7.1 mg/dL    Comment:            Therapeutic target for gout patients: <6.0  Comprehensive metabolic panel     Status: Abnormal   Collection Time: 11/25/18 10:46 AM  Result Value Ref Range   Glucose 112 (H) 65 - 99 mg/dL   BUN 34 (H) 8 - 27 mg/dL   Creatinine, Ser 1.43 (H) 0.57 - 1.00 mg/dL   GFR calc non Af Amer 34 (L) >59 mL/min/1.73   GFR calc Af Amer 39 (L) >59 mL/min/1.73   BUN/Creatinine Ratio 24 12 - 28   Sodium 143 134 - 144 mmol/L   Potassium 4.7 3.5 - 5.2 mmol/L   Chloride 101 96 - 106 mmol/L   CO2 24 20 - 29 mmol/L   Calcium 9.2 8.7 - 10.3 mg/dL   Total Protein 6.5 6.0 - 8.5 g/dL   Albumin 3.9 3.6 - 4.6 g/dL   Globulin, Total 2.6 1.5 - 4.5 g/dL   Albumin/Globulin Ratio 1.5 1.2 - 2.2   Bilirubin Total 0.2 0.0 - 1.2 mg/dL   Alkaline Phosphatase 90 39 - 117 IU/L   AST 14 0 - 40 IU/L   ALT 12 0 - 32 IU/L  Urinalysis, Routine w reflex microscopic     Status: Abnormal   Collection Time: 01/12/19  9:11 AM  Result Value Ref Range   Specific Gravity, UA 1.011 1.005 - 1.030   pH, UA 7.0 5.0 - 7.5   Color, UA Yellow Yellow   Appearance Ur Cloudy (A) Clear   Leukocytes,UA Trace (A)  Negative   Protein,UA Negative Negative/Trace   Glucose, UA Negative Negative   Ketones, UA Negative Negative   RBC, UA Negative Negative   Bilirubin, UA Negative Negative   Urobilinogen, Ur 0.2 0.2 - 1.0 mg/dL   Nitrite, UA Negative Negative   Microscopic Examination See below:     Comment: Microscopic was indicated and was performed.  Microscopic Examination     Status: Abnormal   Collection Time: 01/12/19  9:11 AM   URINE  Result Value Ref Range   WBC, UA 6-10 (A) 0 - 5 /hpf   RBC None seen 0 - 2 /hpf   Epithelial Cells (non renal) 0-10 0 - 10 /hpf   Casts None seen None  seen /lpf   Mucus, UA Present Not Estab.   Bacteria, UA Few None seen/Few   Assessment/Plan: 1. Encounter for general adult medical examination with abnormal findings Annual health maintenance exam today.   2. Essential hypertension Stable. Continue bp medication as prescribed.  - bisoprolol-hydrochlorothiazide (ZIAC) 2.5-6.25 MG tablet; Take 1 tablet by mouth daily.  Dispense: 30 tablet; Refill: 3  3. Pain in left foot Short term prescription for hydrocodone/APAP 5/325mg  which may be taken up to three times daily as needed for pain. Reviewed risks and possible side effects associated with taking opiates, benzodiazepines and other CNS depressants. Combination of these could cause dizziness and drowsiness. Advised patient not to drive or operate machinery when taking these medications, as patient's and other's life can be at risk and will have consequences. Patient verbalized understanding in this matter. Dependence and abuse for these drugs will be monitored closely. A Controlled substance policy and procedure is on file which allows [facility] to order a urine drug screen test at any visit. Patient understands and agrees with the plan - HYDROcodone-acetaminophen (NORCO/VICODIN) 5-325 MG tablet; Take 1 tablet by mouth 3 (three) times daily as needed for moderate pain.  Dispense: 15 tablet; Refill: 0  4. Acute idiopathic gout, unspecified site Continue allopurinol 100mg  daily. - allopurinol (ZYLOPRIM) 100 MG tablet; Take 2 tabs at bed time for gout  Dispense: 60 tablet; Refill: 6  5. Inflammatory polyarthropathy (HCC) Continue to take celebrex daily. May take narcotic pain medication as needed and as prescribed.  6. Abnormal renal function Will get renal ultrasound for further evaluation.  - US Renal; Future  7. Need for vaccination against Streptococcus pneumoniae using pneumococcal conjugate vaccine 7 Prescription for pneumovax vaccine sent to her pharmacy for  administration.  - pneumococcal 23 valent vaccine (PNEUMOVAX 23) 25 MCG/0.5ML injection; Inject 0.27ml IM once  Dispense: 0.5 mL; Refill: 0  8. Dysuria - Urinalysis, Routine w reflex microscopic  General Counseling: Bernice verbalizes understanding of the findings of todays visit and agrees with plan of treatment. I have discussed any further diagnostic evaluation that may be needed or ordered today. We also reviewed her medications today. she has been encouraged to call the office with any questions or concerns that should arise related to todays visit.    Counseling:   This patient was seen by Leretha Pol FNP Collaboration with Dr Lavera Guise as a part of collaborative care agreement  Orders Placed This Encounter  Procedures  . Microscopic Examination  . US Renal  . Urinalysis, Routine w reflex microscopic    Meds ordered this encounter  Medications  . allopurinol (ZYLOPRIM) 100 MG tablet    Sig: Take 2 tabs at bed time for gout    Dispense:  60 tablet  Refill:  6    Order Specific Question:   Supervising Provider    Answer:   Lavera Guise [id number]  . bisoprolol-hydrochlorothiazide (ZIAC) 2.5-6.25 MG tablet    Sig: Take 1 tablet by mouth daily.    Dispense:  30 tablet    Refill:  3    D/c triamterene/HCTZ    Order Specific Question:   Supervising Provider    Answer:   Lavera Guise [id number]  . HYDROcodone-acetaminophen (NORCO/VICODIN) 5-325 MG tablet    Sig: Take 1 tablet by mouth 3 (three) times daily as needed for moderate pain.    Dispense:  15 tablet    Refill:  0    Order Specific Question:   Supervising Provider    Answer:   Lavera Guise [id number]  . pneumococcal 23 valent vaccine (PNEUMOVAX 23) 25 MCG/0.5ML injection    Sig: Inject 0.2ml IM once    Dispense:  0.5 mL    Refill:  0    Order Specific Question:   Supervising Provider    Answer:   Lavera Guise [id number]    Time spent: [place], MD  Internal Medicine

## 2019-01-13 LAB — MICROSCOPIC EXAMINATION
Casts: NONE SEEN /lpf
RBC, Urine: NONE SEEN /hpf (ref 0–2)

## 2019-01-13 LAB — URINALYSIS, ROUTINE W REFLEX MICROSCOPIC
Bilirubin, UA: NEGATIVE
Glucose, UA: NEGATIVE
Ketones, UA: NEGATIVE
Nitrite, UA: NEGATIVE
Protein,UA: NEGATIVE
RBC, UA: NEGATIVE
Specific Gravity, UA: 1.011 (ref 1.005–1.030)
Urobilinogen, Ur: 0.2 mg/dL (ref 0.2–1.0)
pH, UA: 7 (ref 5.0–7.5)

## 2019-01-14 NOTE — Progress Notes (Signed)
Hey. Can you see if they can run culture and sensitivity on this? Thanks.

## 2019-01-19 DIAGNOSIS — N289 Disorder of kidney and ureter, unspecified: Secondary | ICD-10-CM | POA: Insufficient documentation

## 2019-01-19 NOTE — Telephone Encounter (Signed)
DONE

## 2019-01-28 ENCOUNTER — Other Ambulatory Visit: Payer: Medicare Other

## 2019-01-29 ENCOUNTER — Other Ambulatory Visit: Payer: Self-pay | Admitting: Internal Medicine

## 2019-01-31 ENCOUNTER — Other Ambulatory Visit: Payer: Self-pay

## 2019-01-31 MED ORDER — GABAPENTIN 100 MG PO CAPS: ORAL_CAPSULE | ORAL | 0 refills | Status: DC

## 2019-02-02 ENCOUNTER — Ambulatory Visit (INDEPENDENT_AMBULATORY_CARE_PROVIDER_SITE_OTHER): Payer: Medicare Other | Admitting: Adult Health

## 2019-02-02 ENCOUNTER — Other Ambulatory Visit: Payer: Self-pay

## 2019-02-02 ENCOUNTER — Encounter: Payer: Self-pay | Admitting: Adult Health

## 2019-02-02 VITALS — BP 168/64 | HR 65 | Temp 96.4°F | Resp 16 | Ht 67.0 in | Wt 223.0 lb

## 2019-02-02 DIAGNOSIS — R300 Dysuria: Secondary | ICD-10-CM

## 2019-02-02 DIAGNOSIS — N390 Urinary tract infection, site not specified: Secondary | ICD-10-CM | POA: Diagnosis not present

## 2019-02-02 DIAGNOSIS — E559 Vitamin D deficiency, unspecified: Secondary | ICD-10-CM | POA: Diagnosis not present

## 2019-02-02 LAB — POCT URINALYSIS DIPSTICK
Bilirubin, UA: NEGATIVE
Glucose, UA: NEGATIVE
Ketones, UA: NEGATIVE
Nitrite, UA: POSITIVE
Protein, UA: POSITIVE — AB
Spec Grav, UA: 1.01 (ref 1.010–1.025)
Urobilinogen, UA: 0.2 E.U./dL
pH, UA: 5 (ref 5.0–8.0)

## 2019-02-02 MED ORDER — VITAMIN D (ERGOCALCIFEROL) 1.25 MG (50000 UNIT) PO CAPS: 50000.0000 [IU] | ORAL_CAPSULE | ORAL | 0 refills | Status: DC

## 2019-02-02 MED ORDER — NITROFURANTOIN MONOHYD MACRO 100 MG PO CAPS: 100.0000 mg | ORAL_CAPSULE | Freq: Two times a day (BID) | ORAL | 0 refills | Status: DC

## 2019-02-02 NOTE — Progress Notes (Signed)
[facility] [place], [place] [address]  Internal MEDICINE  Office Visit Note  Patient Name: Christy Carter  [id number]  [id number]  Date of Service: 02/02/2019  Chief Complaint  Patient presents with  . Urinary Tract Infection  . Medical Management of Chronic Issues    memmory issues and pt fall 2 times     HPI Pt is here for a sick visit. Pt has been having some falls, and memory issues as well as confusion for the past 2-3 weeks.  Her urine shows a UTI today. She denies any other symptoms. She denies any burning or pain with urination, however she does have itching.     Current Medication:  Outpatient Encounter Medications as of 02/02/2019  Medication Sig  . allopurinol (ZYLOPRIM) 100 MG tablet Take 2 tabs at bed time for gout  . bisoprolol-hydrochlorothiazide (ZIAC) 2.5-6.25 MG tablet Take 1 tablet by mouth daily.  . celecoxib (CELEBREX) 200 MG capsule Take 1 capsule (200 mg total) by mouth daily.  . colchicine 0.6 MG tablet Take 1 tablet (0.6 mg total) by mouth 2 (two) times daily.  Marland Kitchen gabapentin (NEURONTIN) 100 MG capsule Take 1 tab po twice a day for gout  . HYDROcodone-acetaminophen (NORCO/VICODIN) 5-325 MG tablet Take 1 tablet by mouth 3 (three) times daily as needed for moderate pain.  Marland Kitchen ibuprofen (ADVIL) 200 MG tablet Take 200 mg by mouth every 6 (six) hours as needed.  Marland Kitchen levothyroxine (SYNTHROID, LEVOTHROID) 100 MCG tablet Take 1 tablet (100 mcg total) by mouth daily. (Patient taking differently: Take 100 mcg by mouth daily before breakfast. )  . omeprazole (PRILOSEC) 40 MG capsule Take 1 capsule (40 mg total) by mouth daily.  . pneumococcal 23 valent vaccine (PNEUMOVAX 23) 25 MCG/0.5ML injection Inject 0.103ml IM once  . predniSONE (STERAPRED UNI-PAK 21 TAB) 10 MG (21) TBPK tablet 6 day taper - take by mouth as directed for 6 days  . triamterene-hydrochlorothiazide (MAXZIDE-25) 37.5-25 MG tablet TAKE 1 TABLET BY MOUTH EVERY DAY FOR BLOOD PRESSURE   . VENTOLIN HFA 108 (90 Base) MCG/ACT inhaler TAKE 2 PUFFS BY MOUTH EVERY 6 HOURS AS NEEDED FOR WHEEZE OR SHORTNESS OF BREATH  . vitamin E 400 UNIT capsule Take 400 Units by mouth daily.   No facility-administered encounter medications on file as of 02/02/2019.       Medical History: Past Medical History:  Diagnosis Date  . Arthritis   . Carpal tunnel syndrome   . Complication of anesthesia    Difficulty waking up. Believes it took 2 days to wake up after her surgery.  . Depression   . Hyperlipidemia   . Hypertension   . Shortness of breath   . Stroke ([place])   . Thyroid disease      Vital Signs: BP (!) 168/64   Pulse 65   Temp (!) 96.4 F (35.8 C)   Resp 16   Ht 5\' 7"  (1.702 m)   Wt 223 lb (101.2 kg)   SpO2 97%   BMI 34.93 kg/m    Review of Systems  Constitutional: Positive for fatigue. Negative for chills and unexpected weight change.  HENT: Negative for congestion, rhinorrhea, sneezing and sore throat.   Eyes: Negative for photophobia, pain and redness.  Respiratory: Negative for cough, chest tightness and shortness of breath.   Cardiovascular: Negative for chest pain and palpitations.  Gastrointestinal: Negative for abdominal pain, constipation, diarrhea, nausea and vomiting.  Endocrine: Negative.   Genitourinary: Negative for dysuria and  frequency.  Musculoskeletal: Negative for arthralgias, back pain, joint swelling and neck pain.  Skin: Negative for rash.  Allergic/Immunologic: Negative.   Neurological: Negative for tremors and numbness.  Hematological: Negative for adenopathy. Does not bruise/bleed easily.  Psychiatric/Behavioral: Negative for behavioral problems and sleep disturbance. The patient is not nervous/anxious.     Physical Exam Vitals signs and nursing note reviewed.  Constitutional:      General: She is not in acute distress.    Appearance: She is well-developed. She is not diaphoretic.  HENT:     Head: Normocephalic and atraumatic.      Mouth/Throat:     Pharynx: No oropharyngeal exudate.  Eyes:     Pupils: Pupils are equal, round, and reactive to light.  Neck:     Musculoskeletal: Normal range of motion and neck supple.     Thyroid: No thyromegaly.     Vascular: No JVD.     Trachea: No tracheal deviation.  Cardiovascular:     Rate and Rhythm: Normal rate and regular rhythm.     Heart sounds: Normal heart sounds. No murmur. No friction rub. No gallop.   Pulmonary:     Effort: Pulmonary effort is normal. No respiratory distress.     Breath sounds: Normal breath sounds. No wheezing or rales.  Chest:     Chest wall: No tenderness.  Abdominal:     Palpations: Abdomen is soft.     Tenderness: There is no abdominal tenderness. There is no guarding.  Musculoskeletal: Normal range of motion.  Lymphadenopathy:     Cervical: No cervical adenopathy.  Skin:    General: Skin is warm and dry.  Neurological:     Mental Status: She is alert and oriented to person, place, and time.     Cranial Nerves: No cranial nerve deficit.  Psychiatric:        Behavior: Behavior normal.        Thought Content: Thought content normal.        Judgment: Judgment normal.    Assessment/Plan: 1. Urinary tract infection without hematuria, site unspecified Advised patient to take entire course of antibiotics as prescribed with food. Pt should return to clinic in 7-10 days if symptoms fail to improve or new symptoms develop.  - CULTURE, URINE COMPREHENSIVE - nitrofurantoin, macrocrystal-monohydrate, (MACROBID) 100 MG capsule; Take 1 capsule (100 mg total) by mouth 2 (two) times daily.  Dispense: 14 capsule; Refill: 0  2. Dysuria - POCT Urinalysis Dipstick  3. Vitamin D deficiency Refilled drisdol and instructed patient to take once a week.   - Vitamin D, Ergocalciferol, (DRISDOL) 1.25 MG (50000 UT) CAPS capsule; Take 1 capsule (50,000 Units total) by mouth every 7 (seven) days.  Dispense: 10 capsule; Refill: 0  General Counseling: Pepper  verbalizes understanding of the findings of todays visit and agrees with plan of treatment. I have discussed any further diagnostic evaluation that may be needed or ordered today. We also reviewed her medications today. she has been encouraged to call the office with any questions or concerns that should arise related to todays visit.   Orders Placed This Encounter  Procedures  . CULTURE, URINE COMPREHENSIVE  . POCT Urinalysis Dipstick    No orders of the defined types were placed in this encounter.   Time spent: 15 Minutes  This patient was seen by Orson Gear AGNP-C in Collaboration with Dr Lavera Guise as a part of collaborative care agreement.  Kendell Bane AGNP-C Internal Medicine

## 2019-02-03 ENCOUNTER — Inpatient Hospital Stay
Admit: 2019-02-03 | Discharge: 2019-02-03 | Disposition: A | Discharge status: Home / Self Care | Payer: Medicare Other | Attending: Critical Care Medicine | Admitting: Critical Care Medicine

## 2019-02-03 ENCOUNTER — Inpatient Hospital Stay
Admission: EM | Admit: 2019-02-03 | Discharge: 2019-02-05 | DRG: 193 | Disposition: A | Discharge status: Other Acute Inpatient Hospital | Payer: Medicare Other | Attending: Internal Medicine | Admitting: Internal Medicine

## 2019-02-03 ENCOUNTER — Other Ambulatory Visit: Payer: Self-pay

## 2019-02-03 ENCOUNTER — Emergency Department: Payer: Medicare Other

## 2019-02-03 ENCOUNTER — Encounter: Payer: Self-pay | Admitting: Emergency Medicine

## 2019-02-03 ENCOUNTER — Inpatient Hospital Stay: Payer: Medicare Other

## 2019-02-03 DIAGNOSIS — W19XXXA Unspecified fall, initial encounter: Secondary | ICD-10-CM | POA: Diagnosis present

## 2019-02-03 DIAGNOSIS — R918 Other nonspecific abnormal finding of lung field: Secondary | ICD-10-CM | POA: Diagnosis not present

## 2019-02-03 DIAGNOSIS — R0902 Hypoxemia: Secondary | ICD-10-CM

## 2019-02-03 DIAGNOSIS — R0689 Other abnormalities of breathing: Secondary | ICD-10-CM | POA: Diagnosis not present

## 2019-02-03 DIAGNOSIS — R29707 NIHSS score 7: Secondary | ICD-10-CM | POA: Diagnosis not present

## 2019-02-03 DIAGNOSIS — I639 Cerebral infarction, unspecified: Secondary | ICD-10-CM | POA: Diagnosis not present

## 2019-02-03 DIAGNOSIS — E785 Hyperlipidemia, unspecified: Secondary | ICD-10-CM | POA: Diagnosis not present

## 2019-02-03 DIAGNOSIS — M109 Gout, unspecified: Secondary | ICD-10-CM | POA: Diagnosis not present

## 2019-02-03 DIAGNOSIS — R404 Transient alteration of awareness: Secondary | ICD-10-CM | POA: Diagnosis not present

## 2019-02-03 DIAGNOSIS — J9601 Acute respiratory failure with hypoxia: Secondary | ICD-10-CM | POA: Diagnosis present

## 2019-02-03 DIAGNOSIS — Z20828 Contact with and (suspected) exposure to other viral communicable diseases: Secondary | ICD-10-CM | POA: Diagnosis present

## 2019-02-03 DIAGNOSIS — I6601 Occlusion and stenosis of right middle cerebral artery: Secondary | ICD-10-CM | POA: Diagnosis not present

## 2019-02-03 DIAGNOSIS — Z8249 Family history of ischemic heart disease and other diseases of the circulatory system: Secondary | ICD-10-CM

## 2019-02-03 DIAGNOSIS — J9811 Atelectasis: Secondary | ICD-10-CM | POA: Diagnosis present

## 2019-02-03 DIAGNOSIS — F329 Major depressive disorder, single episode, unspecified: Secondary | ICD-10-CM | POA: Diagnosis present

## 2019-02-03 DIAGNOSIS — J189 Pneumonia, unspecified organism: Principal | ICD-10-CM | POA: Diagnosis present

## 2019-02-03 DIAGNOSIS — R471 Dysarthria and anarthria: Secondary | ICD-10-CM | POA: Diagnosis not present

## 2019-02-03 DIAGNOSIS — I63311 Cerebral infarction due to thrombosis of right middle cerebral artery: Secondary | ICD-10-CM | POA: Diagnosis not present

## 2019-02-03 DIAGNOSIS — Z79899 Other long term (current) drug therapy: Secondary | ICD-10-CM

## 2019-02-03 DIAGNOSIS — Z9181 History of falling: Secondary | ICD-10-CM

## 2019-02-03 DIAGNOSIS — K219 Gastro-esophageal reflux disease without esophagitis: Secondary | ICD-10-CM | POA: Diagnosis present

## 2019-02-03 DIAGNOSIS — I63513 Cerebral infarction due to unspecified occlusion or stenosis of bilateral middle cerebral arteries: Secondary | ICD-10-CM | POA: Diagnosis not present

## 2019-02-03 DIAGNOSIS — I130 Hypertensive heart and chronic kidney disease with heart failure and stage 1 through stage 4 chronic kidney disease, or unspecified chronic kidney disease: Secondary | ICD-10-CM | POA: Diagnosis not present

## 2019-02-03 DIAGNOSIS — R000 Tachycardia, unspecified: Secondary | ICD-10-CM | POA: Diagnosis not present

## 2019-02-03 DIAGNOSIS — N390 Urinary tract infection, site not specified: Secondary | ICD-10-CM | POA: Diagnosis not present

## 2019-02-03 DIAGNOSIS — R0602 Shortness of breath: Secondary | ICD-10-CM | POA: Diagnosis not present

## 2019-02-03 DIAGNOSIS — E039 Hypothyroidism, unspecified: Secondary | ICD-10-CM | POA: Diagnosis present

## 2019-02-03 DIAGNOSIS — I5031 Acute diastolic (congestive) heart failure: Secondary | ICD-10-CM | POA: Diagnosis present

## 2019-02-03 DIAGNOSIS — G629 Polyneuropathy, unspecified: Secondary | ICD-10-CM | POA: Diagnosis present

## 2019-02-03 DIAGNOSIS — G9349 Other encephalopathy: Secondary | ICD-10-CM | POA: Diagnosis not present

## 2019-02-03 DIAGNOSIS — S0990XA Unspecified injury of head, initial encounter: Secondary | ICD-10-CM | POA: Diagnosis not present

## 2019-02-03 DIAGNOSIS — G8194 Hemiplegia, unspecified affecting left nondominant side: Secondary | ICD-10-CM | POA: Diagnosis not present

## 2019-02-03 DIAGNOSIS — M064 Inflammatory polyarthropathy: Secondary | ICD-10-CM | POA: Diagnosis present

## 2019-02-03 DIAGNOSIS — G4730 Sleep apnea, unspecified: Secondary | ICD-10-CM | POA: Diagnosis not present

## 2019-02-03 DIAGNOSIS — J9600 Acute respiratory failure, unspecified whether with hypoxia or hypercapnia: Secondary | ICD-10-CM | POA: Diagnosis not present

## 2019-02-03 DIAGNOSIS — R609 Edema, unspecified: Secondary | ICD-10-CM | POA: Diagnosis not present

## 2019-02-03 DIAGNOSIS — Z6836 Body mass index (BMI) 36.0-36.9, adult: Secondary | ICD-10-CM

## 2019-02-03 DIAGNOSIS — Z87891 Personal history of nicotine dependence: Secondary | ICD-10-CM

## 2019-02-03 DIAGNOSIS — E782 Mixed hyperlipidemia: Secondary | ICD-10-CM | POA: Diagnosis present

## 2019-02-03 DIAGNOSIS — G4733 Obstructive sleep apnea (adult) (pediatric): Secondary | ICD-10-CM | POA: Diagnosis present

## 2019-02-03 DIAGNOSIS — J810 Acute pulmonary edema: Secondary | ICD-10-CM | POA: Diagnosis not present

## 2019-02-03 DIAGNOSIS — N183 Chronic kidney disease, stage 3 (moderate): Secondary | ICD-10-CM | POA: Diagnosis present

## 2019-02-03 DIAGNOSIS — Z79890 Hormone replacement therapy (postmenopausal): Secondary | ICD-10-CM

## 2019-02-03 DIAGNOSIS — I10 Essential (primary) hypertension: Secondary | ICD-10-CM | POA: Diagnosis not present

## 2019-02-03 DIAGNOSIS — Z8673 Personal history of transient ischemic attack (TIA), and cerebral infarction without residual deficits: Secondary | ICD-10-CM

## 2019-02-03 DIAGNOSIS — Z9049 Acquired absence of other specified parts of digestive tract: Secondary | ICD-10-CM

## 2019-02-03 LAB — BASIC METABOLIC PANEL
Anion gap: 14 (ref 5–15)
BUN: 23 mg/dL (ref 8–23)
CO2: 28 mmol/L (ref 22–32)
Calcium: 9.2 mg/dL (ref 8.9–10.3)
Chloride: 101 mmol/L (ref 98–111)
Creatinine, Ser: 0.92 mg/dL (ref 0.44–1.00)
GFR calc Af Amer: >60 mL/min (ref >60)
GFR calc non Af Amer: 58 mL/min — ABNORMAL LOW (ref >60)
Glucose, Bld: 122 mg/dL — ABNORMAL HIGH (ref 70–99)
Potassium: 3.5 mmol/L (ref 3.5–5.1)
Sodium: 143 mmol/L (ref 135–145)

## 2019-02-03 LAB — BLOOD GAS, ARTERIAL
Acid-Base Excess: 3.3 mmol/L — ABNORMAL HIGH (ref 0.0–2.0)
Bicarbonate: 27.8 mmol/L (ref 20.0–28.0)
Delivery systems: POSITIVE
Expiratory PAP: 6
FIO2: 0.28
Inspiratory PAP: 12
O2 Saturation: 95.6 %
Patient temperature: 37
pCO2 arterial: 41 mmHg (ref 32.0–48.0)
pH, Arterial: 7.44 (ref 7.350–7.450)
pO2, Arterial: 76 mmHg — ABNORMAL LOW (ref 83.0–108.0)

## 2019-02-03 LAB — URINALYSIS, COMPLETE (UACMP) WITH MICROSCOPIC
Bilirubin Urine: NEGATIVE
Glucose, UA: NEGATIVE mg/dL
Ketones, ur: 5 mg/dL — AB
Nitrite: NEGATIVE
Protein, ur: NEGATIVE mg/dL
Specific Gravity, Urine: 1.014 (ref 1.005–1.030)
pH: 5 (ref 5.0–8.0)

## 2019-02-03 LAB — CBC WITH DIFFERENTIAL/PLATELET
Abs Immature Granulocytes: 0.06 10*3/uL (ref 0.00–0.07)
Basophils Absolute: 0.1 10*3/uL (ref 0.0–0.1)
Basophils Relative: 1 %
Eosinophils Absolute: 0.2 10*3/uL (ref 0.0–0.5)
Eosinophils Relative: 1 %
HCT: 43.3 % (ref 36.0–46.0)
Hemoglobin: 13.8 g/dL (ref 12.0–15.0)
Immature Granulocytes: 0 %
Lymphocytes Relative: 4 %
Lymphs Abs: 0.6 10*3/uL — ABNORMAL LOW (ref 0.7–4.0)
MCH: 29.1 pg (ref 26.0–34.0)
MCHC: 31.9 g/dL (ref 30.0–36.0)
MCV: 91.2 fL (ref 80.0–100.0)
Monocytes Absolute: 0.6 10*3/uL (ref 0.1–1.0)
Monocytes Relative: 4 %
Neutro Abs: 14.4 10*3/uL — ABNORMAL HIGH (ref 1.7–7.7)
Neutrophils Relative %: 90 %
Platelets: 269 10*3/uL (ref 150–400)
RBC: 4.75 MIL/uL (ref 3.87–5.11)
RDW: 15.2 % (ref 11.5–15.5)
WBC: 15.8 10*3/uL — ABNORMAL HIGH (ref 4.0–10.5)
nRBC: 0 % (ref 0.0–0.2)

## 2019-02-03 LAB — CBC
HCT: 42.3 % (ref 36.0–46.0)
Hemoglobin: 13.4 g/dL (ref 12.0–15.0)
MCH: 28.9 pg (ref 26.0–34.0)
MCHC: 31.7 g/dL (ref 30.0–36.0)
MCV: 91.2 fL (ref 80.0–100.0)
Platelets: 270 10*3/uL (ref 150–400)
RBC: 4.64 MIL/uL (ref 3.87–5.11)
RDW: 15.5 % (ref 11.5–15.5)
WBC: 21.6 10*3/uL — ABNORMAL HIGH (ref 4.0–10.5)
nRBC: 0 % (ref 0.0–0.2)

## 2019-02-03 LAB — MRSA PCR SCREENING: MRSA by PCR: NEGATIVE

## 2019-02-03 LAB — CREATININE, SERUM
Creatinine, Ser: 0.86 mg/dL (ref 0.44–1.00)
GFR calc Af Amer: >60 mL/min (ref >60)
GFR calc non Af Amer: >60 mL/min (ref >60)

## 2019-02-03 LAB — CK: Total CK: 327 U/L — ABNORMAL HIGH (ref 38–234)

## 2019-02-03 LAB — TROPONIN I (HIGH SENSITIVITY)
Troponin I (High Sensitivity): 12 ng/L (ref <18)
Troponin I (High Sensitivity): 14 ng/L (ref <18)

## 2019-02-03 LAB — SARS CORONAVIRUS 2 BY RT PCR (HOSPITAL ORDER, PERFORMED IN ~~LOC~~ HOSPITAL LAB): SARS Coronavirus 2: NEGATIVE

## 2019-02-03 LAB — BRAIN NATRIURETIC PEPTIDE: B Natriuretic Peptide: 314 pg/mL — ABNORMAL HIGH (ref 0.0–100.0)

## 2019-02-03 LAB — GLUCOSE, CAPILLARY: Glucose-Capillary: 113 mg/dL — ABNORMAL HIGH (ref 70–99)

## 2019-02-03 LAB — PROCALCITONIN: Procalcitonin: <0.1 ng/mL

## 2019-02-03 LAB — LACTIC ACID, PLASMA: Lactic Acid, Venous: 1.2 mmol/L (ref 0.5–1.9)

## 2019-02-03 MED ORDER — IPRATROPIUM-ALBUTEROL 0.5-2.5 (3) MG/3ML IN SOLN
3.0000 mL | Freq: Four times a day (QID) | RESPIRATORY_TRACT | Status: DC
  Administered 2019-02-03 – 2019-02-05 (×8): 3 mL via RESPIRATORY_TRACT
  Filled 2019-02-03: qty 3
  Filled 2019-02-03: qty 39
  Filled 2019-02-03 (×6): qty 3

## 2019-02-03 MED ORDER — FUROSEMIDE 10 MG/ML IJ SOLN
40.0000 mg | INTRAMUSCULAR | Status: AC
  Administered 2019-02-03: 18:00:00 40 mg via INTRAVENOUS
  Filled 2019-02-03: qty 4

## 2019-02-03 MED ORDER — TRAZODONE HCL 50 MG PO TABS: 25.0000 mg | ORAL_TABLET | Freq: Every evening | ORAL | Status: DC | PRN

## 2019-02-03 MED ORDER — VITAMIN E 180 MG (400 UNIT) PO CAPS
400.0000 [IU] | ORAL_CAPSULE | Freq: Every day | ORAL | Status: DC
  Administered 2019-02-04 – 2019-02-05 (×2): 400 [IU] via ORAL
  Filled 2019-02-03 (×2): qty 1

## 2019-02-03 MED ORDER — DOCUSATE SODIUM 100 MG PO CAPS
100.0000 mg | ORAL_CAPSULE | Freq: Two times a day (BID) | ORAL | Status: DC
  Administered 2019-02-04 – 2019-02-05 (×3): 100 mg via ORAL
  Filled 2019-02-03 (×3): qty 1

## 2019-02-03 MED ORDER — ONDANSETRON HCL 4 MG PO TABS: 4.0000 mg | ORAL_TABLET | Freq: Four times a day (QID) | ORAL | Status: DC | PRN

## 2019-02-03 MED ORDER — LEVOTHYROXINE SODIUM 100 MCG PO TABS
100.0000 ug | ORAL_TABLET | Freq: Every day | ORAL | Status: DC
  Administered 2019-02-05: 100 ug via ORAL
  Filled 2019-02-03: qty 1

## 2019-02-03 MED ORDER — CHLORHEXIDINE GLUCONATE 0.12 % MT SOLN
15.0000 mL | Freq: Two times a day (BID) | OROMUCOSAL | Status: DC
  Administered 2019-02-03 – 2019-02-05 (×4): 15 mL via OROMUCOSAL
  Filled 2019-02-03 (×2): qty 15

## 2019-02-03 MED ORDER — TRIAMTERENE-HCTZ 37.5-25 MG PO TABS
1.0000 | ORAL_TABLET | Freq: Every day | ORAL | Status: DC
  Filled 2019-02-03: qty 1

## 2019-02-03 MED ORDER — HYDROCODONE-ACETAMINOPHEN 5-325 MG PO TABS
1.0000 | ORAL_TABLET | Freq: Three times a day (TID) | ORAL | Status: DC | PRN
  Administered 2019-02-04 (×2): 1 via ORAL
  Filled 2019-02-03 (×3): qty 1

## 2019-02-03 MED ORDER — VITAMIN B-12 1000 MCG PO TABS
2500.0000 ug | ORAL_TABLET | Freq: Every day | ORAL | Status: DC
  Administered 2019-02-04 – 2019-02-05 (×2): 2500 ug via ORAL
  Filled 2019-02-03 (×2): qty 3

## 2019-02-03 MED ORDER — ALLOPURINOL 100 MG PO TABS
100.0000 mg | ORAL_TABLET | Freq: Every day | ORAL | Status: DC
  Administered 2019-02-04 – 2019-02-05 (×2): 100 mg via ORAL
  Filled 2019-02-03 (×2): qty 1

## 2019-02-03 MED ORDER — CHLORHEXIDINE GLUCONATE CLOTH 2 % EX PADS
6.0000 | MEDICATED_PAD | Freq: Every day | CUTANEOUS | Status: DC
  Administered 2019-02-03: 6 via TOPICAL

## 2019-02-03 MED ORDER — ONDANSETRON HCL 4 MG/2ML IJ SOLN: 4.0000 mg | Freq: Four times a day (QID) | INTRAMUSCULAR | Status: DC | PRN

## 2019-02-03 MED ORDER — ENOXAPARIN SODIUM 40 MG/0.4ML ~~LOC~~ SOLN
40.0000 mg | SUBCUTANEOUS | Status: DC
  Administered 2019-02-03 – 2019-02-04 (×2): 40 mg via SUBCUTANEOUS
  Filled 2019-02-03 (×2): qty 0.4

## 2019-02-03 MED ORDER — PANTOPRAZOLE SODIUM 40 MG PO TBEC
40.0000 mg | DELAYED_RELEASE_TABLET | Freq: Every day | ORAL | Status: DC
  Administered 2019-02-04 – 2019-02-05 (×2): 40 mg via ORAL
  Filled 2019-02-03 (×2): qty 1

## 2019-02-03 MED ORDER — CELECOXIB 200 MG PO CAPS
200.0000 mg | ORAL_CAPSULE | Freq: Every day | ORAL | Status: DC
  Administered 2019-02-04 – 2019-02-05 (×2): 200 mg via ORAL
  Filled 2019-02-03 (×3): qty 1

## 2019-02-03 MED ORDER — ACETAMINOPHEN 325 MG PO TABS: 650.0000 mg | ORAL_TABLET | Freq: Four times a day (QID) | ORAL | Status: DC | PRN

## 2019-02-03 MED ORDER — FUROSEMIDE 10 MG/ML IJ SOLN: 20.0000 mg | Freq: Once | INTRAMUSCULAR | Status: DC

## 2019-02-03 MED ORDER — GABAPENTIN 100 MG PO CAPS
100.0000 mg | ORAL_CAPSULE | Freq: Two times a day (BID) | ORAL | Status: DC
  Administered 2019-02-04 – 2019-02-05 (×3): 100 mg via ORAL
  Filled 2019-02-03 (×3): qty 1

## 2019-02-03 MED ORDER — IPRATROPIUM-ALBUTEROL 0.5-2.5 (3) MG/3ML IN SOLN: 3.0000 mL | Freq: Four times a day (QID) | RESPIRATORY_TRACT | Status: DC | PRN

## 2019-02-03 MED ORDER — BISACODYL 5 MG PO TBEC: 5.0000 mg | DELAYED_RELEASE_TABLET | Freq: Every day | ORAL | Status: DC | PRN

## 2019-02-03 MED ORDER — ACETAMINOPHEN 650 MG RE SUPP: 650.0000 mg | Freq: Four times a day (QID) | RECTAL | Status: DC | PRN

## 2019-02-03 MED ORDER — SODIUM CHLORIDE 0.9 % IV SOLN
500.0000 mg | INTRAVENOUS | Status: DC
  Administered 2019-02-03 – 2019-02-04 (×2): 500 mg via INTRAVENOUS
  Filled 2019-02-03 (×3): qty 500

## 2019-02-03 MED ORDER — SODIUM CHLORIDE 0.9 % IV SOLN
1.0000 g | INTRAVENOUS | Status: DC
  Administered 2019-02-04 – 2019-02-05 (×2): 1 g via INTRAVENOUS
  Filled 2019-02-03: qty 10
  Filled 2019-02-03: qty 1
  Filled 2019-02-03: qty 10

## 2019-02-03 MED ORDER — SODIUM CHLORIDE 0.9 % IV SOLN
1.0000 g | Freq: Once | INTRAVENOUS | Status: AC
  Administered 2019-02-03: 1 g via INTRAVENOUS
  Filled 2019-02-03: qty 10

## 2019-02-03 MED ORDER — ORAL CARE MOUTH RINSE
15.0000 mL | Freq: Two times a day (BID) | OROMUCOSAL | Status: DC
  Administered 2019-02-03 – 2019-02-05 (×2): 15 mL via OROMUCOSAL

## 2019-02-03 NOTE — ED Notes (Signed)
Per instructions from the hospitalist, she would like to switch pt over to 4L nasal cannula. This RN explained that Dr. Archie Balboa had instructed to put her on a non-rebreather previously. The hospitalist insisted that we try the 4L nasal cannula instead.

## 2019-02-03 NOTE — ED Notes (Signed)
Attempted to call report but no answer.

## 2019-02-03 NOTE — ED Notes (Signed)
This RN to bedside when entering the room, BiPap mask was off, pt stated that she took it off because it was smothering her. Pt destat to 88-89%. Explained to the pt the importance of keeping the mask on. Masked replaced. O2 sat improved to 99%

## 2019-02-03 NOTE — ED Triage Notes (Signed)
Pt daughter Willaim Bane requesting updates and a call when she can come visit. [phone]. MD and primary RN aware.

## 2019-02-03 NOTE — Consult Note (Signed)
Name: Christy Carter MRN: [id number] DOB: 07/27/1933    ADMISSION DATE:  02/03/2019 CONSULTATION DATE: 02/03/2019  REFERRING MD :  Dr. Vianne Bulls   CHIEF COMPLAINT: Shortness of Breath   BRIEF PATIENT DESCRIPTION:  85 yo female admitted with UTI, acute encephalopathy, and acute hypoxic respiratory failure secondary to mild vascular congestion and pneumonia vs atelectasis requiring Bipap   SIGNIFICANT EVENTS/STUDIES:  09/24-Pt admitted to stepdown unit on Bipap   HISTORY OF PRESENT ILLNESS:  This is an 85 yo female with a PMH of Hypothyroidism, Stroke, HTN, Former Smoker, Hyperlipidemia, Depression, Carpal Tunnel Syndrome, and Arthritis.  She presented to [facility] ER on 09/24 via EMS from home following a fall.  Per ER notes pt found on the floor by her granddaughter the morning of 09/24 with complaints of shortness of breath and bilateral ankle soreness prompting EMS notification. She also fell on 09/22 and endorsed weakness over the past few days. According to pts daughter she has had multiple falls over the last few months and intermittent confusion (pts daughter concerned pt is showing early signs of dementia).  Upon EMS arrival pt hypoxic with O2 sats @84 % on RA, pt placed on 4L O2 via nasal canula. Upon arrival to the ER pt transitioned to Bipap and O2 sats increased to 100%.  COVID-19 negative, however CXR concerning for mild vascular congestion and bibasilar opacities likely atelectasis.  Lab results revealed glucose 122, BNP 314, CK 327, troponin 12, lactic acid 1.2, and wbc 15.8. UA revealed small leukocytes and rare bacteria.  She received azithromycin and ceftriaxone. She was subsequently admitted to the stepdown unit per hospitalist team for additional workup and treatment.   She saw her outpatient PCP on 02/02/2019 and was diagnosed with UTI.  She was prescribed macrobid 100 mg bid daily x7 days, however symptoms worsened prompting current ER visit.  PAST MEDICAL HISTORY :   has a past  medical history of Arthritis, Carpal tunnel syndrome, Complication of anesthesia, Depression, Hyperlipidemia, Hypertension, Shortness of breath, Stroke ([place]), and Thyroid disease.  has a past surgical history that includes Cholecystectomy; Appendectomy; Hernia repair; and Back surgery. Prior to Admission medications   Medication Sig Start Date End Date Taking? Authorizing Provider  allopurinol (ZYLOPRIM) 100 MG tablet Take 2 tabs at bed time for gout 01/12/19  Yes Boscia, Heather E, NP  bisoprolol-hydrochlorothiazide (ZIAC) 2.5-6.25 MG tablet Take 1 tablet by mouth daily. 01/12/19  Yes Ronnell Freshwater, NP  celecoxib (CELEBREX) 200 MG capsule Take 1 capsule (200 mg total) by mouth daily. 07/21/18  Yes Boscia, Heather E, NP  Cyanocobalamin (B-12) 2500 MCG TABS Take 2,500 mcg by mouth daily. As needed for lethargy   Yes [provider]  gabapentin (NEURONTIN) 100 MG capsule Take 1 tab po twice a day for gout 01/31/19  Yes Boscia, Heather E, NP  levothyroxine (SYNTHROID, LEVOTHROID) 100 MCG tablet Take 1 tablet (100 mcg total) by mouth daily. Patient taking differently: Take 100 mcg by mouth daily before breakfast.  06/28/18  Yes Boscia, Heather E, NP  nitrofurantoin, macrocrystal-monohydrate, (MACROBID) 100 MG capsule Take 1 capsule (100 mg total) by mouth 2 (two) times daily. 02/02/19  Yes Scarboro, Audie Clear, NP  omeprazole (PRILOSEC) 40 MG capsule Take 1 capsule (40 mg total) by mouth daily. 11/24/18  Yes Scarboro, Audie Clear, NP  potassium chloride (K-DUR) 10 MEQ tablet Take 10 mEq by mouth 2 (two) times daily. 07/16/18 07/16/19 Yes [provider]  triamterene-hydrochlorothiazide (MAXZIDE-25) 37.5-25 MG tablet TAKE 1 TABLET BY  MOUTH EVERY DAY FOR BLOOD PRESSURE 01/31/19  Yes Scarboro, Audie Clear, NP  Vitamin D, Ergocalciferol, (DRISDOL) 1.25 MG (50000 UT) CAPS capsule Take 1 capsule (50,000 Units total) by mouth every 7 (seven) days. 02/02/19  Yes Scarboro, Audie Clear, NP  vitamin E 400 UNIT capsule Take 400  Units by mouth daily.   Yes [provider]  colchicine 0.6 MG tablet Take 1 tablet (0.6 mg total) by mouth 2 (two) times daily. 12/06/18   Ronnell Freshwater, NP  HYDROcodone-acetaminophen (NORCO/VICODIN) 5-325 MG tablet Take 1 tablet by mouth 3 (three) times daily as needed for moderate pain. 01/12/19   Ronnell Freshwater, NP  ibuprofen (ADVIL) 200 MG tablet Take 200 mg by mouth every 6 (six) hours as needed.    [provider]  pneumococcal 23 valent vaccine (PNEUMOVAX 23) 25 MCG/0.5ML injection Inject 0.42ml IM once 01/12/19   Boscia, Heather E, NP  VENTOLIN HFA 108 (90 Base) MCG/ACT inhaler TAKE 2 PUFFS BY MOUTH EVERY 6 HOURS AS NEEDED FOR WHEEZE OR SHORTNESS OF BREATH 03/19/18   Ronnell Freshwater, NP   No Known Allergies  FAMILY HISTORY:  family history includes Diverticulitis in her mother; Heart disease in her father and mother; Hypertension in her father and mother. SOCIAL HISTORY:  reports that she quit smoking about 26 years ago. Her smoking use included cigarettes. She has never used smokeless tobacco. She reports that she does not drink alcohol or use drugs.  REVIEW OF SYSTEMS: Positives in BOLD  Constitutional: Negative for fever, chills, weight loss, malaise/fatigue and diaphoresis.  HENT: Negative for hearing loss, ear pain, nosebleeds, congestion, sore throat, neck pain, tinnitus and ear discharge.   Eyes: Negative for blurred vision, double vision, photophobia, pain, discharge and redness.  Respiratory: cough, hemoptysis, sputum production, shortness of breath, wheezing and stridor.   Cardiovascular: Negative for chest pain, palpitations, orthopnea, claudication, leg swelling and PND.  Gastrointestinal: Negative for heartburn, nausea, vomiting, abdominal pain, diarrhea, constipation, blood in stool and melena.  Genitourinary: Negative for dysuria, urgency, frequency, hematuria and flank pain.  Musculoskeletal: Negative for myalgias, back pain, joint pain and falls.   Skin: Negative for itching and rash.  Neurological: confusion, dizziness, tingling, tremors, sensory change, speech change, focal weakness, seizures, loss of consciousness, weakness and headaches.  Endo/Heme/Allergies: Negative for environmental allergies and polydipsia. Does not bruise/bleed easily.  SUBJECTIVE:  No complaints at this time   VITAL SIGNS: Temp:  [98.3 F (36.8 C)-100 F (37.8 C)] 100 F (37.8 C) (09/24 1439) Pulse Rate:  [94-111] 106 (09/24 1500) Resp:  [23-45] 35 (09/24 1500) BP: (134-190)/(65-81) 154/68 (09/24 1500) SpO2:  [84 %-100 %] 99 % (09/24 1500) FiO2 (%):  [28 %] 28 % (09/24 1456) Weight:  [101 kg-104.3 kg] 104.3 kg (09/24 1439)  PHYSICAL EXAMINATION: General: well developed, well nourished elderly female, NAD on Bipap  Neuro: alert and oriented, follows commands, intermittent confusion to situation, BUE and BLE strength 4/5, PERRL  HEENT: supple, no JVD  Cardiovascular: nsr, rrr, no R/G  Lungs: faint crackles throughout, labored, and tachypneic  Abdomen: +BS x4, soft, non tender, non distended  Musculoskeletal: 1+ bilateral lower extremity edema  Skin: intact no rashes or lesions present   Recent Labs  Lab 02/03/19 0942 02/03/19 1510  NA 143  --   K 3.5  --   CL 101  --   CO2 28  --   BUN 23  --   CREATININE 0.92 0.86  GLUCOSE 122*  --  Recent Labs  Lab 02/03/19 0942 02/03/19 1510  HGB 13.8 13.4  HCT 43.3 42.3  WBC 15.8* 21.6*  PLT 269 270   Dg Chest Portable 1 View  Result Date: 02/03/2019 CLINICAL DATA:  Shortness of breath EXAM: PORTABLE CHEST 1 VIEW COMPARISON:  10/13/2018 FINDINGS: The heart size and mediastinal contours are within normal limits. Calcific aortic. Mild pulmonary vascular congestion. Streaky bibasilar opacities. No pleural effusion or pneumothorax. IMPRESSION: 1. Mild pulmonary vascular congestion. 2. Streaky bibasilar opacities, favor atelectasis. Electronically Signed   By: Davina Poke M.D.   On: 02/03/2019  10:34    ASSESSMENT / PLAN:  Acute hypoxic respiratory failure secondary to mild vascular congestion and pneumonia vs. atelectasis Hx: Former smoker  Prn Bipap for dyspnea and/or hypoxic  Scheduled and prn bronchodilator therapy  40 mg iv lasix x1 dose  Echo pending   UTI  Possible pneumonia  Trend WBC and monitor fever curve Will check PCT  Follow cultures  Continue azithromycin and ceftriaxone for now   Hypothyroidism  Continuous telemetry monitoring  Continue outpatient synthroid   Acute encephalopathy  CT Head pending  Frequent reorientation   Marda Stalker, [place] Pager [phone] (please enter 7 digits) PCCM Consult Pager [phone] (please enter 7 digits)

## 2019-02-03 NOTE — ED Notes (Signed)
Per instructions from Dr. Archie Balboa, MD. He instructed [place] to take her off BiPap and try a non-rebreather on the pt.

## 2019-02-03 NOTE — ED Notes (Signed)
Switched pt from BiPap to 4L nasal cannula.

## 2019-02-03 NOTE — ED Provider Notes (Signed)
[facility] Emergency Department Provider Note   ____________________________________________   I have reviewed the triage vital signs and the nursing notes.   HISTORY  Chief Complaint Fall  History limited by: Not Limited   HPI Christy Carter is a 85 y.o. female who presents to the emergency department today after being found down on the ground.  Patient states that she fell yesterday.  She states that she did not pass out although she felt lightheaded but remembers going all the way to the ground.  She denies any traumatic injury from hitting the ground. She has noticed over the past few days that she is felt weak.  She has also had some shortness of breath.  The patient denies any associated chest pain.  States that she is not normally on oxygen.  When EMS arrived she had room sats in the 80s.  Patient denies any urinary tract symptoms.   Records reviewed. Per medical record review patient has a history of stroke, HTN, HLD.   Past Medical History:  Diagnosis Date  . Arthritis   . Carpal tunnel syndrome   . Complication of anesthesia    Difficulty waking up. Believes it took 2 days to wake up after her surgery.  . Depression   . Hyperlipidemia   . Hypertension   . Shortness of breath   . Stroke ([place])   . Thyroid disease     Patient Active Problem List   Diagnosis Date Noted  . Abnormal renal function 01/19/2019  . Pain in left foot 12/19/2018  . Hematuria 08/02/2018  . Influenza A 03/02/2018  . Urinary tract infection without hematuria 03/02/2018  . Sepsis ([place]) 02/11/2018  . CAP (community acquired pneumonia) 02/11/2018  . CKD (chronic kidney disease), stage III ([place]) 02/11/2018  . Memory loss, short term 02/10/2018  . Flu vaccine need 02/10/2018  . Chest pain 02/03/2018  . Acute upper respiratory infection 02/03/2018  . Gastroesophageal reflux disease without esophagitis 02/03/2018  . Encounter for general adult medical examination with  abnormal findings 01/13/2018  . Chronic low back pain 01/13/2018  . Need for vaccination against Streptococcus pneumoniae using pneumococcal conjugate vaccine 7 01/13/2018  . Dysuria 01/13/2018  . 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 Pelvic pain 10/08/2017  . Gout 06/15/2017  . Mixed hyperlipidemia 06/15/2017  . Inflammatory polyarthropathy ([place]) 06/15/2017  . Neuralgia and neuritis, unspecified 06/15/2017  . Major depressive disorder, recurrent, mild ([place]) 06/15/2017  . Other vitamin B12 deficiency anemias 06/15/2017  . Hypothyroidism 10/17/2008  . OVERWEIGHT/OBESITY 10/17/2008  . SLEEP APNEA, OBSTRUCTIVE 10/17/2008  . Essential hypertension 10/17/2008  . GALLSTONES 10/17/2008  . DYSPNEA 10/17/2008  . Overweight 10/17/2008    Past Surgical History:  Procedure Laterality Date  . APPENDECTOMY    . BACK SURGERY    . CHOLECYSTECTOMY    . HERNIA REPAIR      Prior to Admission medications   Medication Sig Start Date End Date Taking? Authorizing Provider  allopurinol (ZYLOPRIM) 100 MG tablet Take 2 tabs at bed time for gout 01/12/19   Ronnell Freshwater, NP  bisoprolol-hydrochlorothiazide (ZIAC) 2.5-6.25 MG tablet Take 1 tablet by mouth daily. 01/12/19   Ronnell Freshwater, NP  celecoxib (CELEBREX) 200 MG capsule Take 1 capsule (200 mg total) by mouth daily. 07/21/18   Ronnell Freshwater, NP  colchicine 0.6 MG tablet Take 1 tablet (0.6 mg total) by mouth 2 (two) times daily. 12/06/18   Ronnell Freshwater, NP  gabapentin (NEURONTIN)  100 MG capsule Take 1 tab po twice a day for gout 01/31/19   Ronnell Freshwater, NP  HYDROcodone-acetaminophen (NORCO/VICODIN) 5-325 MG tablet Take 1 tablet by mouth 3 (three) times daily as needed for moderate pain. 01/12/19   Ronnell Freshwater, NP  ibuprofen (ADVIL) 200 MG tablet Take 200 mg by mouth every 6 (six) hours as needed.    [provider]  levothyroxine (SYNTHROID, LEVOTHROID) 100 MCG tablet Take 1 tablet (100 mcg total) by  mouth daily. Patient taking differently: Take 100 mcg by mouth daily before breakfast.  06/28/18   Ronnell Freshwater, NP  nitrofurantoin, macrocrystal-monohydrate, (MACROBID) 100 MG capsule Take 1 capsule (100 mg total) by mouth 2 (two) times daily. 02/02/19   Kendell Bane, NP  omeprazole (PRILOSEC) 40 MG capsule Take 1 capsule (40 mg total) by mouth daily. 11/24/18   Kendell Bane, NP  pneumococcal 23 valent vaccine (PNEUMOVAX 23) 25 MCG/0.5ML injection Inject 0.40ml IM once 01/12/19   Ronnell Freshwater, NP  predniSONE (STERAPRED UNI-PAK 21 TAB) 10 MG (21) TBPK tablet 6 day taper - take by mouth as directed for 6 days 12/06/18   Ronnell Freshwater, NP  triamterene-hydrochlorothiazide (MAXZIDE-25) 37.5-25 MG tablet TAKE 1 TABLET BY MOUTH EVERY DAY FOR BLOOD PRESSURE 01/31/19   Kendell Bane, NP  VENTOLIN HFA 108 (90 Base) MCG/ACT inhaler TAKE 2 PUFFS BY MOUTH EVERY 6 HOURS AS NEEDED FOR WHEEZE OR SHORTNESS OF BREATH 03/19/18   Ronnell Freshwater, NP  Vitamin D, Ergocalciferol, (DRISDOL) 1.25 MG (50000 UT) CAPS capsule Take 1 capsule (50,000 Units total) by mouth every 7 (seven) days. 02/02/19   Kendell Bane, NP  vitamin E 400 UNIT capsule Take 400 Units by mouth daily.    [provider]    Allergies Patient has no known allergies.  Family History  Problem Relation Age of Onset  . Heart disease Mother   . Hypertension Mother   . Diverticulitis Mother   . Heart disease Father   . Hypertension Father     Social History Social History   Tobacco Use  . Smoking status: Former Smoker    Types: Cigarettes    Quit date: 08/15/1992    Years since quitting: 26.4  . Smokeless tobacco: Never Used  Substance Use Topics  . Alcohol use: No  . Drug use: No    Review of Systems Constitutional: No fever/chills Eyes: No visual changes. ENT: No sore throat. Cardiovascular: Denies chest pain. Respiratory: Positive for shortness of breath. Gastrointestinal: No abdominal pain.  No  nausea, no vomiting.  No diarrhea.   Genitourinary: Negative for dysuria. Musculoskeletal: Negative for back pain. Skin: Negative for rash. Neurological: Positive for lightheadedness. ____________________________________________   PHYSICAL EXAM:  VITAL SIGNS: ED Triage Vitals  Enc Vitals Group     BP 02/03/19 0937 (!) 171/75     Pulse Rate 02/03/19 0937 (!) 101     Resp 02/03/19 0937 (!) 28     Temp 02/03/19 1000 98.3 F (36.8 C)     Temp Source 02/03/19 1000 Axillary     SpO2 02/03/19 0931 95 %     Weight 02/03/19 0940 222 lb 10.6 oz (101 kg)     Height 02/03/19 0940 5\' 7"  (1.702 m)     Head Circumference --      Peak Flow --      Pain Score 02/03/19 0940 0   Constitutional: Alert and oriented.  Eyes: Conjunctivae are normal.  ENT  Head: Normocephalic and atraumatic.      Nose: No congestion/rhinnorhea.      Mouth/Throat: Mucous membranes are moist.      Neck: No stridor. Hematological/Lymphatic/Immunilogical: No cervical lymphadenopathy. Cardiovascular: Tachycardic, regular rhythm.  No murmurs, rubs, or gallops. Respiratory: Increased respiratory effort and rate. On BIPAP. Slight rhonchi in lower lungs.  Gastrointestinal: Soft and non tender. No rebound. No guarding.  Genitourinary: Deferred Musculoskeletal: Normal range of motion in all extremities. No lower extremity edema. Neurologic:  Normal speech and language. No gross focal neurologic deficits are appreciated.  Skin:  Skin is warm, dry and intact. No rash noted. Psychiatric: Mood and affect are normal. Speech and behavior are normal. Patient exhibits appropriate insight and judgment.  ____________________________________________    LABS (pertinent positives/negatives)  UA cloudy, small hgb dipstick, ketones 5, small leukocytes, 6-10 rbc, 11-20 wbc, rare bacteria CK 327 CBC wbc 15.8, hgb 13.8, plt 269 BMP wnl except glu 122  ____________________________________________   EKG  I, Nance Pear,  attending physician, personally viewed and interpreted this EKG  EKG Time: 0937 Rate: 104 Rhythm: sinus tachycardia Axis: normal  Intervals: qtc 523 QRS: narrow ST changes: no st elevation Impression: abnormal ekg   ____________________________________________    RADIOLOGY  CXR Mild pulmonary vascular congestion  ____________________________________________   PROCEDURES  Procedures  CRITICAL CARE Performed by: Nance Pear   Total critical care time: 35 minutes  Critical care time was exclusive of separately billable procedures and treating other patients.  Critical care was necessary to treat or prevent imminent or life-threatening deterioration.  Critical care was time spent personally by me on the following activities: development of treatment plan with patient and/or surrogate as well as nursing, discussions with consultants, evaluation of patient's response to treatment, examination of patient, obtaining history from patient or surrogate, ordering and performing treatments and interventions, ordering and review of laboratory studies, ordering and review of radiographic studies, pulse oximetry and re-evaluation of patient's condition.  ____________________________________________   INITIAL IMPRESSION / ASSESSMENT AND PLAN / ED COURSE  Pertinent labs & imaging results that were available during my care of the patient were reviewed by me and considered in my medical decision making (see chart for details).   Patient presented to the emergency department today after falls and being found on the ground.  Patient does complain of a near syncopal episode prior to going to the ground.  Patient was found to be hypoxic on room air by EMS and was brought in under CPAP.  She was converted to BiPAP.  Chest x-ray shows some possible vascular congestion.  UA is concerning for urinary tract infection.  We did try taking the patient off BiPAP however she continued to desat.  Will  plan on treating for urinary tract infection.  Will plan on admission. ____________________________________________   FINAL CLINICAL IMPRESSION(S) / ED DIAGNOSES  Final diagnoses:  Hypoxia  Lower urinary tract infection     Note: This dictation was prepared with Dragon dictation. Any transcriptional errors that result from this process are unintentional     Nance Pear, MD 02/03/19 1510

## 2019-02-03 NOTE — ED Notes (Addendum)
After a few minutes on the nasal cannula, pt is working hard to breathe. Labored breathing at 35 breaths a min. Pt desat to 84%. Placed pt on non-rebreather. RT notified.

## 2019-02-03 NOTE — H&P (Signed)
[facility] [place] NAME: Christy Carter    MR#:  [id number]  DATE OF BIRTH:  [birth year]  DATE OF ADMISSION:  02/03/2019  PRIMARY CARE PHYSICIAN: Lavera Guise, MD   REQUESTING/REFERRING PHYSICIAN: Dr. Archie Balboa  CHIEF COMPLAINT: Fall   Chief Complaint  Patient presents with  . Shortness of Breath    HISTORY OF PRESENT ILLNESS:  Christy Carter  is a 85 y.o. female with a known history of arthritis, lung nodules comes in because of fall and found to have hypoxia saturation 84% on 4 L when EMS got her.  Blood work showed COVID-19 test negative, has elevated white count, mild UTI, atelectasis in the chest x-ray.  Followed by Dr. Heidi Dach has history of lung nodules.  Patient states that she had a fall this morning feels very tired.  Did not hit her head.  Complains of chills, generalized weakness. PAST MEDICAL HISTORY:   Past Medical History:  Diagnosis Date  . Arthritis   . Carpal tunnel syndrome   . Complication of anesthesia    Difficulty waking up. Believes it took 2 days to wake up after her surgery.  . Depression   . Hyperlipidemia   . Hypertension   . Shortness of breath   . Stroke ([place])   . Thyroid disease     PAST SURGICAL HISTOIRY:   Past Surgical History:  Procedure Laterality Date  . APPENDECTOMY    . BACK SURGERY    . CHOLECYSTECTOMY    . HERNIA REPAIR      SOCIAL HISTORY:   Social History   Tobacco Use  . Smoking status: Former Smoker    Types: Cigarettes    Quit date: 08/15/1992    Years since quitting: 26.4  . Smokeless tobacco: Never Used  Substance Use Topics  . Alcohol use: No    FAMILY HISTORY:   Family History  Problem Relation Age of Onset  . Heart disease Mother   . Hypertension Mother   . Diverticulitis Mother   . Heart disease Father   . Hypertension Father     DRUG ALLERGIES:  No Known Allergies  REVIEW OF SYSTEMS:  CONSTITUTIONAL: COMPLAINS OF GENERALIZED WEAKNESS  EYES: No  blurred or double vision.  EARS, NOSE, AND THROAT: No tinnitus or ear pain.  RESPIRATORY: Denies any shortness of breath but patient he is hypoxic without BiPAP.  CARDIOVASCULAR: No chest pain, orthopnea, edema.  GASTROINTESTINAL: No nausea, vomiting, diarrhea or abdominal pain.  GENITOURINARY: No dysuria, hematuria.  ENDOCRINE: No polyuria, nocturia,  HEMATOLOGY: No anemia, easy bruising or bleeding SKIN: No rash or lesion. MUSCULOSKELETAL: No joint pain or arthritis.   NEUROLOGIC: No tingling, numbness, weakness.  PSYCHIATRY: No anxiety or depression.   MEDICATIONS AT HOME:   Prior to Admission medications   Medication Sig Start Date End Date Taking? Authorizing Provider  allopurinol (ZYLOPRIM) 100 MG tablet Take 2 tabs at bed time for gout 01/12/19  Yes Boscia, Heather E, NP  bisoprolol-hydrochlorothiazide (ZIAC) 2.5-6.25 MG tablet Take 1 tablet by mouth daily. 01/12/19  Yes Ronnell Freshwater, NP  celecoxib (CELEBREX) 200 MG capsule Take 1 capsule (200 mg total) by mouth daily. 07/21/18  Yes Boscia, Heather E, NP  Cyanocobalamin (B-12) 2500 MCG TABS Take 2,500 mcg by mouth daily. As needed for lethargy   Yes [provider]  gabapentin (NEURONTIN) 100 MG capsule Take 1 tab po twice a day for gout 01/31/19  Yes Ronnell Freshwater, NP  levothyroxine (SYNTHROID, LEVOTHROID) 100 MCG tablet Take 1 tablet (100 mcg total) by mouth daily. Patient taking differently: Take 100 mcg by mouth daily before breakfast.  06/28/18  Yes Boscia, Heather E, NP  nitrofurantoin, macrocrystal-monohydrate, (MACROBID) 100 MG capsule Take 1 capsule (100 mg total) by mouth 2 (two) times daily. 02/02/19  Yes Scarboro, Audie Clear, NP  omeprazole (PRILOSEC) 40 MG capsule Take 1 capsule (40 mg total) by mouth daily. 11/24/18  Yes Scarboro, Audie Clear, NP  potassium chloride (K-DUR) 10 MEQ tablet Take 10 mEq by mouth 2 (two) times daily. 07/16/18 07/16/19 Yes [provider]  triamterene-hydrochlorothiazide (MAXZIDE-25)  37.5-25 MG tablet TAKE 1 TABLET BY MOUTH EVERY DAY FOR BLOOD PRESSURE 01/31/19  Yes Scarboro, Audie Clear, NP  Vitamin D, Ergocalciferol, (DRISDOL) 1.25 MG (50000 UT) CAPS capsule Take 1 capsule (50,000 Units total) by mouth every 7 (seven) days. 02/02/19  Yes Scarboro, Audie Clear, NP  vitamin E 400 UNIT capsule Take 400 Units by mouth daily.   Yes [provider]  colchicine 0.6 MG tablet Take 1 tablet (0.6 mg total) by mouth 2 (two) times daily. 12/06/18   Ronnell Freshwater, NP  HYDROcodone-acetaminophen (NORCO/VICODIN) 5-325 MG tablet Take 1 tablet by mouth 3 (three) times daily as needed for moderate pain. 01/12/19   Ronnell Freshwater, NP  ibuprofen (ADVIL) 200 MG tablet Take 200 mg by mouth every 6 (six) hours as needed.    [provider]  pneumococcal 23 valent vaccine (PNEUMOVAX 23) 25 MCG/0.5ML injection Inject 0.19ml IM once 01/12/19   Boscia, Heather E, NP  VENTOLIN HFA 108 (90 Base) MCG/ACT inhaler TAKE 2 PUFFS BY MOUTH EVERY 6 HOURS AS NEEDED FOR WHEEZE OR SHORTNESS OF BREATH 03/19/18   Boscia, Greer Ee, NP      VITAL SIGNS:  Blood pressure (!) 178/71, pulse (!) 109, temperature 98.3 F (36.8 C), temperature source Axillary, resp. rate (!) 35, height 5\' 7"  (1.702 m), weight 101 kg, SpO2 (!) 84 %.  PHYSICAL EXAMINATION:  GENERAL:  85 y.o.-year-old patient lying in the bed with no acute distress.  EYES: Pupils equal, round, reactive to light and accommodation. No scleral icterus. Extraocular muscles intact.  HEENT: Head atraumatic, normocephalic. Oropharynx and nasopharynx clear.  NECK:  Supple, no jugular venous distention. No thyroid enlargement, no tenderness.  LUNGS: Mostly clear to auscultation, no wheezing. CARDIOVASCULAR: S1, S2 normal. No murmurs, rubs, or gallops.  ABDOMEN: Soft, nontender, nondistended. Bowel sounds present. No organomegaly or mass.  EXTREMITIES: SLIGHT PEDAL EDEMA.  NEUROLOGIC: Cranial nerves II through XII are intact. Muscle strength 5/5 in all  extremities. Sensation intact. Gait not checked.  PSYCHIATRIC: The patient is alert and oriented x 3.  SKIN: No obvious rash, lesion, or ulcer.   LABORATORY PANEL:   CBC Recent Labs  Lab 02/03/19 0942  WBC 15.8*  HGB 13.8  HCT 43.3  PLT 269   ------------------------------------------------------------------------------------------------------------------  Chemistries  Recent Labs  Lab 02/03/19 0942  NA 143  K 3.5  CL 101  CO2 28  GLUCOSE 122*  BUN 23  CREATININE 0.92  CALCIUM 9.2   ------------------------------------------------------------------------------------------------------------------  Cardiac Enzymes No results for input(s): TROPONINI in the last 168 hours. ------------------------------------------------------------------------------------------------------------------  RADIOLOGY:  Dg Chest Portable 1 View  Result Date: 02/03/2019 CLINICAL DATA:  Shortness of breath EXAM: PORTABLE CHEST 1 VIEW COMPARISON:  10/13/2018 FINDINGS: The heart size and mediastinal contours are within normal limits. Calcific aortic. Mild pulmonary vascular congestion. Streaky bibasilar opacities. No pleural effusion or pneumothorax. IMPRESSION: 1.  Mild pulmonary vascular congestion. 2. Streaky bibasilar opacities, favor atelectasis. Electronically Signed   By: Davina Poke M.D.   On: 02/03/2019 10:34    EKG:   Orders placed or performed during the hospital encounter of 02/03/19  . EKG 12-Lead  . EKG 12-Lead  Sinus tachycardia with 104 bpm, some PACs noted.  IMPRESSION AND PLAN:   85 year old female with history of hypertension, hyperlipidemia, previous stroke, gout, lung nodules comes in because of fall and found to have mild UTI, atelectasis in the lungs #1, acute respiratory failure with hypoxia secondary to possibly developing pneumonia, underlying.lung nodule history, patient did not tolerate nasal cannula, saturation dropped to 84% on 4 L nasal cannula, continued on  BiPAP, admitted to stepdown unit, epic text message Dr;Aleskorov. 2.  Mild UTI, follow urine cultures, received will r Rocephin  #3 .essential hypertension, controlled, continue home medication 4.  History of sleep apnea continue CPAP at night. 5.  History of fall, generalized weakness, consult physical therapy. All the records are reviewed and case discussed with ED provider. Management plans discussed with the patient, family and they are in agreement.  CODE STATUS:FULL  TOTAL TIME TAKING CARE OF THIS PATIENT:3minutes.    Epifanio Lesches M.D on 02/03/2019 at 1:21 PM  Between 7am to 6pm - Pager - [phone]  After 6pm go to [email or web address] - password [email or web address] [facility]  Office  [phone]  CC: Primary care physician; Lavera Guise, MD  Note: This dictation was prepared with Dragon dictation along with smaller phrase technology. Any transcriptional errors that result from this process are unintentional.

## 2019-02-03 NOTE — Progress Notes (Signed)
RT assisted with patient transport from ER to ICU while patient on the V60 bipap. No complications arose during transport.

## 2019-02-03 NOTE — ED Notes (Signed)
Pt placed on BiPap on arrival, pt O2 saturation in 100% at this time

## 2019-02-03 NOTE — Progress Notes (Signed)
Patient transported to CT on BIPAP.Pt tolerated transport well.

## 2019-02-03 NOTE — ED Notes (Signed)
RT at bedside. Placed pt back on BiPap. Will notify hospitalist.

## 2019-02-03 NOTE — ED Notes (Signed)
Pt tolerating Bi-Pap well.

## 2019-02-03 NOTE — ED Notes (Signed)
Dr. Vianne Bulls notified about placing pt back on BiPap

## 2019-02-03 NOTE — Progress Notes (Signed)
PT Cancellation Note  Patient Details Name: Christy Carter MRN: [id number] DOB: 11-Jan-1934   Cancelled Treatment:    Reason Eval/Treat Not Completed: Medical issues which prohibited therapy(Consult received and chart reviewed. Patient currently in CCU, requiring BiPAP for respiratory support.  Will hold therapy at this time.  Will re-attempt at later time/date as medically stable and appropriate.)   Jahna Liebert H. Owens Shark, PT, DPT, NCS 02/03/19, 3:35 PM [phone]

## 2019-02-03 NOTE — ED Triage Notes (Signed)
Pt via EMS from home. EMS initially called for a fall. Pt was found on the floor by her granddaughter 0830 this am. Pt c/o SOB and soreness in her bilateral ankles. Initially pt was 84% on 4L when EMS got there.

## 2019-02-04 ENCOUNTER — Inpatient Hospital Stay: Payer: Medicare Other

## 2019-02-04 LAB — URINE CULTURE

## 2019-02-04 LAB — BASIC METABOLIC PANEL
Anion gap: 12 (ref 5–15)
BUN: 23 mg/dL (ref 8–23)
CO2: 26 mmol/L (ref 22–32)
Calcium: 8.3 mg/dL — ABNORMAL LOW (ref 8.9–10.3)
Chloride: 107 mmol/L (ref 98–111)
Creatinine, Ser: 1 mg/dL (ref 0.44–1.00)
GFR calc Af Amer: >60 mL/min (ref >60)
GFR calc non Af Amer: 52 mL/min — ABNORMAL LOW (ref >60)
Glucose, Bld: 106 mg/dL — ABNORMAL HIGH (ref 70–99)
Potassium: 3 mmol/L — ABNORMAL LOW (ref 3.5–5.1)
Sodium: 145 mmol/L (ref 135–145)

## 2019-02-04 LAB — ECHOCARDIOGRAM COMPLETE
Height: 66 in
Weight: 3679.04 oz

## 2019-02-04 LAB — CBC
HCT: 37.4 % (ref 36.0–46.0)
Hemoglobin: 11.9 g/dL — ABNORMAL LOW (ref 12.0–15.0)
MCH: 29.2 pg (ref 26.0–34.0)
MCHC: 31.8 g/dL (ref 30.0–36.0)
MCV: 91.7 fL (ref 80.0–100.0)
Platelets: 243 10*3/uL (ref 150–400)
RBC: 4.08 MIL/uL (ref 3.87–5.11)
RDW: 15.7 % — ABNORMAL HIGH (ref 11.5–15.5)
WBC: 14.5 10*3/uL — ABNORMAL HIGH (ref 4.0–10.5)
nRBC: 0 % (ref 0.0–0.2)

## 2019-02-04 LAB — BRAIN NATRIURETIC PEPTIDE: B Natriuretic Peptide: 304 pg/mL — ABNORMAL HIGH (ref 0.0–100.0)

## 2019-02-04 LAB — PHOSPHORUS: Phosphorus: 3.3 mg/dL (ref 2.5–4.6)

## 2019-02-04 LAB — GLUCOSE, CAPILLARY: Glucose-Capillary: 131 mg/dL — ABNORMAL HIGH (ref 70–99)

## 2019-02-04 LAB — MAGNESIUM: Magnesium: 1.9 mg/dL (ref 1.7–2.4)

## 2019-02-04 MED ORDER — FUROSEMIDE 10 MG/ML IJ SOLN
20.0000 mg | Freq: Once | INTRAMUSCULAR | Status: AC
  Administered 2019-02-04: 20 mg via INTRAVENOUS
  Filled 2019-02-04: qty 2

## 2019-02-04 MED ORDER — POTASSIUM CHLORIDE 10 MEQ/100ML IV SOLN
10.0000 meq | INTRAVENOUS | Status: AC
  Administered 2019-02-04 (×4): 10 meq via INTRAVENOUS
  Filled 2019-02-04 (×4): qty 100

## 2019-02-04 NOTE — Progress Notes (Signed)
[facility] [place] NAME: Christy Carter    MR#:  [id number]  DATE OF BIRTH:  [birth year]  SUBJECTIVE:   Patient admitted to the hospital secondary to acute respiratory failure with hypoxia secondary to suspected pneumonia/mild CHF.  Much improved today and weaned off BiPAP.  Patient presently denies any chest pains, admits to a cough which is mildly productive.  No other acute events overnight.  REVIEW OF SYSTEMS:    Review of Systems  Constitutional: Negative for chills and fever.  HENT: Negative for congestion and tinnitus.   Eyes: Negative for blurred vision and double vision.  Respiratory: Positive for cough. Negative for shortness of breath and wheezing.   Cardiovascular: Negative for chest pain, orthopnea and PND.  Gastrointestinal: Negative for abdominal pain, diarrhea, nausea and vomiting.  Genitourinary: Negative for dysuria and hematuria.  Neurological: Positive for weakness (generalized). Negative for dizziness, sensory change and focal weakness.  All other systems reviewed and are negative.   Nutrition: Heart Healthy Tolerating Diet: Yes Tolerating PT: Await Eval.    DRUG ALLERGIES:  No Known Allergies  VITALS:  Blood pressure (!) 127/45, pulse 92, temperature 98.9 F (37.2 C), temperature source Oral, resp. rate 16, height 5\' 6"  (1.676 m), weight 103.4 kg, SpO2 95 %.  PHYSICAL EXAMINATION:   Physical Exam  GENERAL:  85 y.o.-year-old patient lying in bed in no acute distress.  EYES: Pupils equal, round, reactive to light and accommodation. No scleral icterus. Extraocular muscles intact.  HEENT: Head atraumatic, normocephalic. Oropharynx and nasopharynx clear.  NECK:  Supple, no jugular venous distention. No thyroid enlargement, no tenderness.  LUNGS: Poor Resp effort, no wheezing, Basilar rales b/l, No rhonchi. No use of accessory muscles of respiration.  CARDIOVASCULAR: S1, S2 normal. No murmurs, rubs, or gallops.   ABDOMEN: Soft, nontender, nondistended. Bowel sounds present. No organomegaly or mass.  EXTREMITIES: No cyanosis, clubbing or edema b/l.    NEUROLOGIC: Cranial nerves II through XII are intact. No focal Motor or sensory deficits b/l.  Globally weak.  PSYCHIATRIC: The patient is alert and oriented x 3.  SKIN: No obvious rash, lesion, or ulcer.    LABORATORY PANEL:   CBC Recent Labs  Lab 02/04/19 0401  WBC 14.5*  HGB 11.9*  HCT 37.4  PLT 243   ------------------------------------------------------------------------------------------------------------------  Chemistries  Recent Labs  Lab 02/04/19 0401  NA 145  K 3.0*  CL 107  CO2 26  GLUCOSE 106*  BUN 23  CREATININE 1.00  CALCIUM 8.3*  MG 1.9   ------------------------------------------------------------------------------------------------------------------  Cardiac Enzymes No results for input(s): TROPONINI in the last 168 hours. ------------------------------------------------------------------------------------------------------------------  RADIOLOGY:  Ct Head Wo Contrast  Result Date: 02/03/2019 CLINICAL DATA:  Altered level of consciousness, fall EXAM: CT HEAD WITHOUT CONTRAST TECHNIQUE: Contiguous axial images were obtained from the base of the skull through the vertex without intravenous contrast. COMPARISON:  09/06/2013 FINDINGS: Brain: There is atrophy and chronic small vessel disease changes. No acute intracranial abnormality. Specifically, no hemorrhage, hydrocephalus, mass lesion, acute infarction, or significant intracranial injury. Vascular: No hyperdense vessel or unexpected calcification. Skull: No acute calvarial abnormality. Sinuses/Orbits: Visualized paranasal sinuses and mastoids clear. Orbital soft tissues unremarkable. Other: None IMPRESSION: Atrophy, chronic microvascular disease. No acute intracranial abnormality. Electronically Signed   By: Rolm Baptise M.D.   On: 02/03/2019 18:48   Dg Chest Port  1 View  Result Date: 02/04/2019 CLINICAL DATA:  Acute respiratory failure. EXAM: PORTABLE CHEST 1 VIEW COMPARISON:  02/03/2019  and earlier exams. FINDINGS: Vascular congestion and interstitial thickening is stable from the previous day's study. No lung consolidation. No convincing pleural effusion and no pneumothorax. Mild streaky basilar atelectasis. Cardiac silhouette normal in size. IMPRESSION: 1. Stable exam from the previous day's study. 2. Vascular congestion with interstitial prominence. No evidence of pneumonia. Electronically Signed   By: Lajean Manes M.D.   On: 02/04/2019 09:17   Dg Chest Portable 1 View  Result Date: 02/03/2019 CLINICAL DATA:  Shortness of breath EXAM: PORTABLE CHEST 1 VIEW COMPARISON:  10/13/2018 FINDINGS: The heart size and mediastinal contours are within normal limits. Calcific aortic. Mild pulmonary vascular congestion. Streaky bibasilar opacities. No pleural effusion or pneumothorax. IMPRESSION: 1. Mild pulmonary vascular congestion. 2. Streaky bibasilar opacities, favor atelectasis. Electronically Signed   By: Davina Poke M.D.   On: 02/03/2019 10:34     ASSESSMENT AND PLAN:   85 year old female with past medical history of previous CVA, hypertension hyperlipidemia, depression, history of carpal tunnel syndrome, hypothyroidism who presented to the hospital due to shortness of breath and respiratory distress.  1.  Acute respiratory failure with hypoxia- secondary to suspected pneumonia and also mild CHF. -Initially was on BiPAP but now weaned off of it. -Continue IV ceftriaxone, Zithromax.  Given 1 dose of Lasix this morning.  Echo pending. - Continue O2 supplementation.  2.  Pneumonia-source of patient's hypoxemia and shortness of breath. - Continue IV ceftriaxone, Zithromax.  Follow cultures.  3.  CHF-unclear if it systolic or diastolic.  Echocardiogram pending. - X-ray yesterday showing mild pulmonary vascular congestion.  - given one dose of IV lasix  this a.m.   4. Hx of Gout - cont. Allopurinol.  - no acute attack  5. Hx of Hypothyroidism - cont. Synthroid.   6. GERD - cont. Protonix.   7. Neuropathy - cont. Neurontin.    Transfer to floor/await PT eval.    All the records are reviewed and case discussed with Care Management/Social Worker. Management plans discussed with the patient, family and they are in agreement.  CODE STATUS: Full code  DVT Prophylaxis: Lovenox  TOTAL TIME TAKING CARE OF THIS PATIENT: 30 minutes.   POSSIBLE D/C IN 1-2 DAYS, DEPENDING ON CLINICAL CONDITION.   Henreitta Leber M.D on 02/04/2019 at 3:23 PM  Between 7am to 6pm - Pager - [phone]  After 6pm go to [email or web address] - password [email or web address] [facility]  Office  [phone]  CC: Primary care physician; Lavera Guise, MD

## 2019-02-04 NOTE — Progress Notes (Signed)
CRITICAL CARE PROGRESS NOTE    Name: Christy Carter MRN: [id number] DOB: Nov 24, 1933     LOS: 1   SUBJECTIVE FINDINGS & SIGNIFICANT EVENTS    Patient is clinically improved, I have met with the son at the bedside to discuss patient's reason for admission as well as hospital course and overall improvement.  Patient has eaten breakfast without problems. Off BIPAP overnight. Clinically improved.  PT/OT eval today  PAST MEDICAL HISTORY   Past Medical History:  Diagnosis Date   Arthritis    Carpal tunnel syndrome    Complication of anesthesia    Difficulty waking up. Believes it took 2 days to wake up after her surgery.   Depression    Hyperlipidemia    Hypertension    Shortness of breath    Stroke [facility])    Thyroid disease      SURGICAL HISTORY   Past Surgical History:  Procedure Laterality Date   APPENDECTOMY     BACK SURGERY     CHOLECYSTECTOMY     HERNIA REPAIR       FAMILY HISTORY   Family History  Problem Relation Age of Onset   Heart disease Mother    Hypertension Mother    Diverticulitis Mother    Heart disease Father    Hypertension Father      SOCIAL HISTORY   Social History   Tobacco Use   Smoking status: Former Smoker    Types: Cigarettes    Quit date: 08/15/1992    Years since quitting: 26.4   Smokeless tobacco: Never Used  Substance Use Topics   Alcohol use: No   Drug use: No     MEDICATIONS   Current Medication:  Current Facility-Administered Medications:    acetaminophen (TYLENOL) tablet 650 mg, 650 mg, Oral, Q6H PRN **OR** acetaminophen (TYLENOL) suppository 650 mg, 650 mg, Rectal, Q6H PRN, Epifanio Lesches, MD   allopurinol (ZYLOPRIM) tablet 100 mg, 100 mg, Oral, Daily, Epifanio Lesches, MD   azithromycin (ZITHROMAX) 500 mg in  sodium chloride 0.9 % 250 mL IVPB, 500 mg, Intravenous, Q24H, Epifanio Lesches, MD, Last Rate: 250 mL/hr at 02/03/19 1738, 500 mg at 02/03/19 1738   bisacodyl (DULCOLAX) EC tablet 5 mg, 5 mg, Oral, Daily PRN, Epifanio Lesches, MD   cefTRIAXone (ROCEPHIN) 1 g in sodium chloride 0.9 % 100 mL IVPB, 1 g, Intravenous, Q24H, Konidena, Snehalatha, MD   celecoxib (CELEBREX) capsule 200 mg, 200 mg, Oral, Daily, Vianne Bulls, Snehalatha, MD   chlorhexidine (PERIDEX) 0.12 % solution 15 mL, 15 mL, Mouth Rinse, BID, Epifanio Lesches, MD, 15 mL at 02/03/19 2212   Chlorhexidine Gluconate Cloth 2 % PADS 6 each, 6 each, Topical, Daily, Epifanio Lesches, MD, 6 each at 02/03/19 1447   docusate sodium (COLACE) capsule 100 mg, 100 mg, Oral, BID, Epifanio Lesches, MD   enoxaparin (LOVENOX) injection 40 mg, 40 mg, Subcutaneous, Q24H, Vianne Bulls, Snehalatha, MD, 40 mg at 02/03/19 2212   gabapentin (NEURONTIN) capsule 100 mg, 100 mg, Oral, BID, Epifanio Lesches, MD   HYDROcodone-acetaminophen (NORCO/VICODIN) 5-325 MG per tablet 1 tablet, 1 tablet, Oral, TID PRN, Epifanio Lesches, MD   ipratropium-albuterol (DUONEB) 0.5-2.5 (3) MG/3ML nebulizer solution 3 mL, 3 mL, Nebulization, Q6H, Konidena, Snehalatha, MD, 3 mL at 02/04/19 0742   ipratropium-albuterol (DUONEB) 0.5-2.5 (3) MG/3ML nebulizer solution 3 mL, 3 mL, Nebulization, Q6H PRN, Awilda Bill, NP   levothyroxine (SYNTHROID) tablet 100 mcg, 100 mcg, Oral, QAC breakfast, Epifanio Lesches, MD, Stopped at 02/04/19 0505   MEDLINE mouth  rinse, 15 mL, Mouth Rinse, q12n4p, Epifanio Lesches, MD, 15 mL at 02/03/19 1748   ondansetron (ZOFRAN) tablet 4 mg, 4 mg, Oral, Q6H PRN **OR** ondansetron (ZOFRAN) injection 4 mg, 4 mg, Intravenous, Q6H PRN, Epifanio Lesches, MD   pantoprazole (PROTONIX) EC tablet 40 mg, 40 mg, Oral, Daily, Vianne Bulls, Snehalatha, MD   potassium chloride 10 mEq in 100 mL IVPB, 10 mEq, Intravenous, Q1 Hr x 4,  Darel Hong D, NP, Last Rate: 75 mL/hr at 02/04/19 1022, 10 mEq at 02/04/19 1022   traZODone (DESYREL) tablet 25 mg, 25 mg, Oral, QHS PRN, Epifanio Lesches, MD   vitamin B-12 (CYANOCOBALAMIN) tablet 2,500 mcg, 2,500 mcg, Oral, Daily, Epifanio Lesches, MD   vitamin E capsule 400 Units, 400 Units, Oral, Daily, Epifanio Lesches, MD    ALLERGIES   Patient has no known allergies.    REVIEW OF SYSTEMS     N point ROS conducted and is negative except as per HPI and subjective findings  PHYSICAL EXAMINATION   Vital Signs: Temp:  [98.2 F (36.8 C)-100 F (37.8 C)] 98.4 F (36.9 C) (09/25 0900) Pulse Rate:  [80-111] 92 (09/25 1034) Resp:  [16-45] 28 (09/25 1034) BP: (127-190)/(45-124) 148/53 (09/25 1000) SpO2:  [84 %-100 %] 91 % (09/25 1034) FiO2 (%):  [28 %] 28 % (09/25 0744) Weight:  [103.4 kg-104.3 kg] 103.4 kg (09/25 0500)  GENERAL: No apparent distress HEAD: Normocephalic, atraumatic.  EYES: Pupils equal, round, reactive to light.  No scleral icterus.  MOUTH: Moist mucosal membrane. NECK: Supple. No thyromegaly. No nodules. No JVD.  PULMONARY: Mild bibasilar crackles CARDIOVASCULAR: S1 and S2. Regular rate and rhythm. No murmurs, rubs, or gallops.  GASTROINTESTINAL: Soft, nontender, non-distended. No masses. Positive bowel sounds. No hepatosplenomegaly.  MUSCULOSKELETAL: No swelling, clubbing, or edema.  NEUROLOGIC: Mild distress due to acute illness SKIN:intact,warm,dry   PERTINENT DATA     Infusions:  azithromycin 500 mg (02/03/19 1738)   cefTRIAXone (ROCEPHIN)  IV     potassium chloride 10 mEq (02/04/19 1022)   Scheduled Medications:  allopurinol  100 mg Oral Daily   celecoxib  200 mg Oral Daily   chlorhexidine  15 mL Mouth Rinse BID   Chlorhexidine Gluconate Cloth  6 each Topical Daily   docusate sodium  100 mg Oral BID   enoxaparin (LOVENOX) injection  40 mg Subcutaneous Q24H   gabapentin  100 mg Oral BID    ipratropium-albuterol  3 mL Nebulization Q6H   levothyroxine  100 mcg Oral QAC breakfast   mouth rinse  15 mL Mouth Rinse q12n4p   pantoprazole  40 mg Oral Daily   vitamin B-12  2,500 mcg Oral Daily   vitamin E  400 Units Oral Daily   PRN Medications: acetaminophen **OR** acetaminophen, bisacodyl, HYDROcodone-acetaminophen, ipratropium-albuterol, ondansetron **OR** ondansetron (ZOFRAN) IV, traZODone Hemodynamic parameters:   Intake/Output: 09/24 0701 - 09/25 0700 In: -  Out: 1300 [Urine:1300]  Ventilator  Settings: FiO2 (%):  [28 %] 28 %   Other Labs:     LAB RESULTS:  Basic Metabolic Panel: Recent Labs  Lab 02/03/19 0942 02/03/19 1510 02/04/19 0401  NA 143  --  145  K 3.5  --  3.0*  CL 101  --  107  CO2 28  --  26  GLUCOSE 122*  --  106*  BUN 23  --  23  CREATININE 0.92 0.86 1.00  CALCIUM 9.2  --  8.3*  MG  --   --  1.9  PHOS  --   --  3.3   Liver Function Tests: No results for input(s): AST, ALT, ALKPHOS, BILITOT, PROT, ALBUMIN in the last 168 hours. No results for input(s): LIPASE, AMYLASE in the last 168 hours. No results for input(s): AMMONIA in the last 168 hours. CBC: Recent Labs  Lab 02/03/19 0942 02/03/19 1510 02/04/19 0401  WBC 15.8* 21.6* 14.5*  NEUTROABS 14.4*  --   --   HGB 13.8 13.4 11.9*  HCT 43.3 42.3 37.4  MCV 91.2 91.2 91.7  PLT 269 270 243   Cardiac Enzymes: Recent Labs  Lab 02/03/19 0942  CKTOTAL 327*   BNP: Invalid input(s): POCBNP CBG: Recent Labs  Lab 02/03/19 1438  GLUCAP 113*     IMAGING RESULTS:  Imaging: Ct Head Wo Contrast  Result Date: 02/03/2019 CLINICAL DATA:  Altered level of consciousness, fall EXAM: CT HEAD WITHOUT CONTRAST TECHNIQUE: Contiguous axial images were obtained from the base of the skull through the vertex without intravenous contrast. COMPARISON:  09/06/2013 FINDINGS: Brain: There is atrophy and chronic small vessel disease changes. No acute intracranial abnormality. Specifically, no  hemorrhage, hydrocephalus, mass lesion, acute infarction, or significant intracranial injury. Vascular: No hyperdense vessel or unexpected calcification. Skull: No acute calvarial abnormality. Sinuses/Orbits: Visualized paranasal sinuses and mastoids clear. Orbital soft tissues unremarkable. Other: None IMPRESSION: Atrophy, chronic microvascular disease. No acute intracranial abnormality. Electronically Signed   By: Rolm Baptise M.D.   On: 02/03/2019 18:48   Dg Chest Port 1 View  Result Date: 02/04/2019 CLINICAL DATA:  Acute respiratory failure. EXAM: PORTABLE CHEST 1 VIEW COMPARISON:  02/03/2019 and earlier exams. FINDINGS: Vascular congestion and interstitial thickening is stable from the previous day's study. No lung consolidation. No convincing pleural effusion and no pneumothorax. Mild streaky basilar atelectasis. Cardiac silhouette normal in size. IMPRESSION: 1. Stable exam from the previous day's study. 2. Vascular congestion with interstitial prominence. No evidence of pneumonia. Electronically Signed   By: Lajean Manes M.D.   On: 02/04/2019 09:17   Dg Chest Portable 1 View  Result Date: 02/03/2019 CLINICAL DATA:  Shortness of breath EXAM: PORTABLE CHEST 1 VIEW COMPARISON:  10/13/2018 FINDINGS: The heart size and mediastinal contours are within normal limits. Calcific aortic. Mild pulmonary vascular congestion. Streaky bibasilar opacities. No pleural effusion or pneumothorax. IMPRESSION: 1. Mild pulmonary vascular congestion. 2. Streaky bibasilar opacities, favor atelectasis. Electronically Signed   By: Davina Poke M.D.   On: 02/03/2019 10:34   _0 @   ASSESSMENT AND PLAN    -Multidisciplinary rounds held today   Acute hypoxic respiratory failure secondary to mild vascular congestion and pneumonia vs. atelectasis Hx: Former smoker  Prn Bipap for dyspnea and/or hypoxic  Scheduled and prn bronchodilator therapy  40 mg iv lasix x1 dose  Echo pending   UTI  Possible  pneumonia  Trend WBC and monitor fever curve Will check PCT  Follow cultures  Continue azithromycin and ceftriaxone for now   Hypothyroidism  Continuous telemetry monitoring  Continue outpatient synthroid   Acute encephalopathy  CT Head pending  Frequent reorientation   GI/Nutrition GI PROPHYLAXIS as indicated DIET-->TF's as tolerated Constipation protocol as indicated  ENDO - ICU hypoglycemic\Hyperglycemia protocol -check FSBS per protocol   ELECTROLYTES -follow labs as needed -replace as needed -pharmacy consultation   DVT/GI PRX ordered -SCDs  TRANSFUSIONS AS NEEDED MONITOR FSBS ASSESS the need for LABS as needed   Critical care provider statement:    Critical care time (minutes):  32   Critical care time was exclusive of:  Separately billable procedures and treating  other patients   Critical care was necessary to treat or prevent imminent or life-threatening deterioration of the following conditions:   Acute hypoxemic respiratory failure, hypothyroidism, multiple comorbid conditions   Critical care was time spent personally by me on the following activities:  Development of treatment plan with patient or surrogate, discussions with consultants, evaluation of patient's response to treatment, examination of patient, obtaining history from patient or surrogate, ordering and performing treatments and interventions, ordering and review of laboratory studies and re-evaluation of patient's condition.  I assumed direction of critical care for this patient from another provider in my specialty: no    This document was prepared using Dragon voice recognition software and may include unintentional dictation errors.    Ottie Glazier, M.D.  Division of [place]

## 2019-02-04 NOTE — Progress Notes (Signed)
Physical Therapy Evaluation Patient Details Name: Christy Carter MRN: [id number] DOB: 10/25/1933 Today's Date: 02/04/2019   History of Present Illness  Pt. is a 85 y/o M admitted 2/2 BiPAP use to address ARF. Pt is now on room air. Pt has c/o falls, weakness, SOB, bilateral ankle soreness. PMH includes depression, HLD, HTN, SOB, stroke.    Clinical Impression  Pt is a pleasant 85 year old F who was admitted for BiPAP needs 2/2 ARF. Upon entry, pt is resting in supine with daughter present. Daughter states that her mother is "not herself". Pt performs bed mobility and transfers with mod/max assist. Pt was at EOB and unable to stand with +2 assist from PT. Pt demonstrates significant UE weakness on the L side with hand and fingers in flexed position. Pt also has difficulty engaging L and R side simultaneously. L side weakness present in both UE and LE. Concern regarding L side weakness and possible L facial droop expressed to RN following treatment. Pt demonstrates impairments with strength, mobility, and activity tolerance. Current recommendation for follow-up care is SNF due to current mobility status.     Follow Up Recommendations SNF    Equipment Recommendations  Rolling walker with 5" wheels    Recommendations for Other Services       Precautions / Restrictions Precautions Precautions: Fall Restrictions Weight Bearing Restrictions: No      Mobility  Bed Mobility Overal bed mobility: Needs Assistance Bed Mobility: Supine to Sit;Sit to Supine     Supine to sit: HOB elevated;Mod assist Sit to supine: Max assist   General bed mobility comments: Pt required PT assistance with bed mobility. L UE did not change position; assist bringing bilateral LE on and off table. Increased difficulty with L LE. Assist also required behind shoulders to bring trunk upright and return patient to bed.  Transfers                 General transfer comment: Pt up at EOB with two blocked knees.  Two attempts made, but pt was unable to transfer from sit to stand.   Ambulation/Gait             General Gait Details: Unable to assess  Stairs            Wheelchair Mobility    Modified Rankin (Stroke Patients Only)       Balance Overall balance assessment: Needs assistance;History of Falls Sitting-balance support: Feet supported Sitting balance-Leahy Scale: Poor Sitting balance - Comments: Pt presents with significant trunk lean toward L side Postural control: Left lateral lean     Standing balance comment: unable to assess                             Pertinent Vitals/Pain Pain Assessment: Faces Faces Pain Scale: Hurts a little bit Pain Location: wrist and arm cramping Pain Descriptors / Indicators: Aching Pain Intervention(s): Limited activity within patient's tolerance;Monitored during session;Repositioned    Home Living Family/patient expects to be discharged to:: Private residence Living Arrangements: Alone Available Help at Discharge: Family;Available 24 hours/day(son and daughter planning to "move in" to provide 24/7 care) Type of Home: House Home Access: Ramped entrance     Home Layout: One level Home Equipment: [place] - 4 wheels;Walker - 2 wheels Additional Comments: Pt's daughter and son will be splitting time staying with her    Prior Function Level of Independence: Independent with assistive device(s)  Comments: Pt independent with rollater; household amb     Hand Dominance        Extremity/Trunk Assessment   Upper Extremity Assessment Upper Extremity Assessment: Generalized weakness;LUE deficits/detail LUE Deficits / Details: Pt L LE extemely weak; L wrist and hand in flexed position upon entry.     Lower Extremity Assessment Lower Extremity Assessment: Generalized weakness;LLE deficits/detail(bilateral hip flex: 3/5) LLE Deficits / Details: bilateral weakness; L weaker than R    Cervical / Trunk  Assessment Cervical / Trunk Assessment: Other exceptions;Kyphotic Cervical / Trunk Exceptions: When seated at EOB, pt has trunk lean toward L side  Communication   Communication: No difficulties  Cognition Arousal/Alertness: Awake/alert Behavior During Therapy: WFL for tasks assessed/performed Overall Cognitive Status: Within Functional Limits for tasks assessed                                 General Comments: Pt is oriented to self and year; able to recall month with prompting.       General Comments General comments (skin integrity, edema, etc.): Pt is generally weak, significantly increased weakness on LUE and LLE. Pt also presents with slight drooping of the mouth on L side. Pt has difficulty performing exercises with L and R side simultaneously.     Exercises Other Exercises Other Exercises: Supine, 10 repetitions: AP, QS, SAQ; significant verbal cueing for sequencing and bilateral engagement Other Exercises: Scooting in bed to reach EOB; 2+ assist required. Pt working at maintaining upright balance   Assessment/Plan    PT Assessment Patient needs continued PT services  PT Problem List Decreased strength;Decreased activity tolerance;Decreased balance;Decreased mobility;Decreased coordination;Pain       PT Treatment Interventions DME instruction;Gait training;Stair training;Therapeutic activities;Therapeutic exercise;Balance training;Patient/family education    PT Goals (Current goals can be found in the Care Plan section)  Acute Rehab PT Goals Patient Stated Goal: to go home PT Goal Formulation: With patient Time For Goal Achievement: 02/18/19 Potential to Achieve Goals: Fair    Frequency Min 2X/week   Barriers to discharge        Co-evaluation               AM-PAC PT "6 Clicks" Mobility  Outcome Measure Help needed turning from your back to your side while in a flat bed without using bedrails?: A Lot Help needed moving from lying on your back  to sitting on the side of a flat bed without using bedrails?: A Lot Help needed moving to and from a bed to a chair (including a wheelchair)?: A Lot Help needed standing up from a chair using your arms (e.g., wheelchair or bedside chair)?: A Lot Help needed to walk in hospital room?: A Lot Help needed climbing 3-5 steps with a railing? : Total 6 Click Score: 11    End of Session Equipment Utilized During Treatment: Gait belt Activity Tolerance: No increased pain;Patient limited by lethargy Patient left: in bed;with bed alarm set;with family/visitor present(L UE support 2 pillows; washcloth in L hand) Nurse Communication: Mobility status(Concern regarding LUE weakness and drooping) PT Visit Diagnosis: History of falling (Z91.81);Repeated falls (R29.6);Muscle weakness (generalized) (M62.81);Pain Pain - Right/Left: Left Pain - part of body: Arm;Hand    Time: 1410-1448 PT Time Calculation (min) (ACUTE ONLY): 38 min   Charges:   PT Evaluation $PT Eval Moderate Complexity: 1 Mod PT Treatments $Therapeutic Exercise: 8-22 mins $Therapeutic Activity: 8-22 mins  Dixie Dials, SPT   Jinger Middlesworth 02/04/2019, 5:39 PM

## 2019-02-04 NOTE — Progress Notes (Signed)
Notified by physical therapy during their evaluation of pt she suddenly developed left sided weakness and left sided facial droop.  Upon my arrival at pts bedside pt alert and oriented, BLU/BLL extremity motor strength 4/5, sensation wnl bilaterally, PERRLA present, and no facial droop.  Findings consistent with previous assessments.  Pt laughing and joking appropriately.  Will continue to monitor and assess pt.    Marda Stalker, [place] Pager [phone] (please enter 7 digits) PCCM Consult Pager [phone] (please enter 7 digits)

## 2019-02-05 ENCOUNTER — Inpatient Hospital Stay: Payer: Medicare Other

## 2019-02-05 ENCOUNTER — Inpatient Hospital Stay (HOSPITAL_COMMUNITY): Payer: Medicare Other

## 2019-02-05 ENCOUNTER — Inpatient Hospital Stay (HOSPITAL_COMMUNITY)
Admission: AD | Admit: 2019-02-05 | Discharge: 2019-02-11 | DRG: 023 | Disposition: A | Discharge status: Rehabilitation Facility | Payer: Medicare Other | Source: Ambulatory Visit | Attending: Neurology | Admitting: Neurology

## 2019-02-05 ENCOUNTER — Encounter (HOSPITAL_COMMUNITY): Payer: Self-pay | Admitting: Anesthesiology

## 2019-02-05 ENCOUNTER — Inpatient Hospital Stay: Admission: AD | Admit: 2019-02-05 | Payer: Medicare Other | Source: Ambulatory Visit | Admitting: Neurology

## 2019-02-05 ENCOUNTER — Inpatient Hospital Stay (HOSPITAL_COMMUNITY): Payer: Medicare Other | Admitting: Anesthesiology

## 2019-02-05 ENCOUNTER — Encounter: Payer: Self-pay | Admitting: Radiology

## 2019-02-05 ENCOUNTER — Encounter (HOSPITAL_COMMUNITY)
Admission: AD | Disposition: A | Discharge status: Skilled Nursing Facility | Payer: Self-pay | Source: Ambulatory Visit | Attending: Neurology

## 2019-02-05 DIAGNOSIS — I639 Cerebral infarction, unspecified: Secondary | ICD-10-CM | POA: Diagnosis not present

## 2019-02-05 DIAGNOSIS — I69322 Dysarthria following cerebral infarction: Secondary | ICD-10-CM | POA: Diagnosis not present

## 2019-02-05 DIAGNOSIS — Z79890 Hormone replacement therapy (postmenopausal): Secondary | ICD-10-CM

## 2019-02-05 DIAGNOSIS — R1310 Dysphagia, unspecified: Secondary | ICD-10-CM

## 2019-02-05 DIAGNOSIS — E039 Hypothyroidism, unspecified: Secondary | ICD-10-CM | POA: Diagnosis present

## 2019-02-05 DIAGNOSIS — H17811 Minor opacity of cornea, right eye: Secondary | ICD-10-CM

## 2019-02-05 DIAGNOSIS — N179 Acute kidney failure, unspecified: Secondary | ICD-10-CM | POA: Diagnosis not present

## 2019-02-05 DIAGNOSIS — I63511 Cerebral infarction due to unspecified occlusion or stenosis of right middle cerebral artery: Secondary | ICD-10-CM | POA: Diagnosis not present

## 2019-02-05 DIAGNOSIS — N183 Chronic kidney disease, stage 3 (moderate): Secondary | ICD-10-CM | POA: Diagnosis not present

## 2019-02-05 DIAGNOSIS — G4733 Obstructive sleep apnea (adult) (pediatric): Secondary | ICD-10-CM | POA: Diagnosis not present

## 2019-02-05 DIAGNOSIS — R4189 Other symptoms and signs involving cognitive functions and awareness: Secondary | ICD-10-CM | POA: Diagnosis not present

## 2019-02-05 DIAGNOSIS — Z7982 Long term (current) use of aspirin: Secondary | ICD-10-CM

## 2019-02-05 DIAGNOSIS — I6601 Occlusion and stenosis of right middle cerebral artery: Secondary | ICD-10-CM | POA: Diagnosis present

## 2019-02-05 DIAGNOSIS — E785 Hyperlipidemia, unspecified: Secondary | ICD-10-CM

## 2019-02-05 DIAGNOSIS — I63531 Cerebral infarction due to unspecified occlusion or stenosis of right posterior cerebral artery: Secondary | ICD-10-CM | POA: Diagnosis not present

## 2019-02-05 DIAGNOSIS — E669 Obesity, unspecified: Secondary | ICD-10-CM | POA: Diagnosis present

## 2019-02-05 DIAGNOSIS — I63533 Cerebral infarction due to unspecified occlusion or stenosis of bilateral posterior cerebral arteries: Secondary | ICD-10-CM | POA: Diagnosis not present

## 2019-02-05 DIAGNOSIS — R0602 Shortness of breath: Secondary | ICD-10-CM | POA: Diagnosis not present

## 2019-02-05 DIAGNOSIS — J189 Pneumonia, unspecified organism: Secondary | ICD-10-CM | POA: Diagnosis not present

## 2019-02-05 DIAGNOSIS — Z23 Encounter for immunization: Secondary | ICD-10-CM | POA: Diagnosis not present

## 2019-02-05 DIAGNOSIS — Z8249 Family history of ischemic heart disease and other diseases of the circulatory system: Secondary | ICD-10-CM

## 2019-02-05 DIAGNOSIS — G9340 Encephalopathy, unspecified: Secondary | ICD-10-CM | POA: Diagnosis not present

## 2019-02-05 DIAGNOSIS — I69391 Dysphagia following cerebral infarction: Secondary | ICD-10-CM

## 2019-02-05 DIAGNOSIS — Z20828 Contact with and (suspected) exposure to other viral communicable diseases: Secondary | ICD-10-CM | POA: Diagnosis present

## 2019-02-05 DIAGNOSIS — E782 Mixed hyperlipidemia: Secondary | ICD-10-CM | POA: Diagnosis present

## 2019-02-05 DIAGNOSIS — J810 Acute pulmonary edema: Secondary | ICD-10-CM | POA: Diagnosis not present

## 2019-02-05 DIAGNOSIS — R0989 Other specified symptoms and signs involving the circulatory and respiratory systems: Secondary | ICD-10-CM | POA: Diagnosis not present

## 2019-02-05 DIAGNOSIS — I5033 Acute on chronic diastolic (congestive) heart failure: Secondary | ICD-10-CM | POA: Diagnosis not present

## 2019-02-05 DIAGNOSIS — I6300 Cerebral infarction due to thrombosis of unspecified precerebral artery: Secondary | ICD-10-CM | POA: Diagnosis not present

## 2019-02-05 DIAGNOSIS — I6389 Other cerebral infarction: Secondary | ICD-10-CM | POA: Diagnosis not present

## 2019-02-05 DIAGNOSIS — Z713 Dietary counseling and surveillance: Secondary | ICD-10-CM | POA: Diagnosis not present

## 2019-02-05 DIAGNOSIS — J9601 Acute respiratory failure with hypoxia: Secondary | ICD-10-CM | POA: Diagnosis not present

## 2019-02-05 DIAGNOSIS — Z87891 Personal history of nicotine dependence: Secondary | ICD-10-CM

## 2019-02-05 DIAGNOSIS — N390 Urinary tract infection, site not specified: Secondary | ICD-10-CM | POA: Diagnosis present

## 2019-02-05 DIAGNOSIS — R29818 Other symptoms and signs involving the nervous system: Secondary | ICD-10-CM | POA: Diagnosis not present

## 2019-02-05 DIAGNOSIS — Z79899 Other long term (current) drug therapy: Secondary | ICD-10-CM | POA: Diagnosis not present

## 2019-02-05 DIAGNOSIS — R1312 Dysphagia, oropharyngeal phase: Secondary | ICD-10-CM | POA: Diagnosis not present

## 2019-02-05 DIAGNOSIS — E876 Hypokalemia: Secondary | ICD-10-CM | POA: Diagnosis not present

## 2019-02-05 DIAGNOSIS — I6621 Occlusion and stenosis of right posterior cerebral artery: Secondary | ICD-10-CM | POA: Diagnosis not present

## 2019-02-05 DIAGNOSIS — M19071 Primary osteoarthritis, right ankle and foot: Secondary | ICD-10-CM | POA: Diagnosis not present

## 2019-02-05 DIAGNOSIS — G8194 Hemiplegia, unspecified affecting left nondominant side: Secondary | ICD-10-CM | POA: Diagnosis not present

## 2019-02-05 DIAGNOSIS — R29707 NIHSS score 7: Secondary | ICD-10-CM | POA: Diagnosis not present

## 2019-02-05 DIAGNOSIS — G479 Sleep disorder, unspecified: Secondary | ICD-10-CM | POA: Diagnosis not present

## 2019-02-05 DIAGNOSIS — R451 Restlessness and agitation: Secondary | ICD-10-CM | POA: Diagnosis present

## 2019-02-05 DIAGNOSIS — Z6838 Body mass index (BMI) 38.0-38.9, adult: Secondary | ICD-10-CM

## 2019-02-05 DIAGNOSIS — I10 Essential (primary) hypertension: Secondary | ICD-10-CM | POA: Diagnosis not present

## 2019-02-05 DIAGNOSIS — J9811 Atelectasis: Secondary | ICD-10-CM | POA: Diagnosis not present

## 2019-02-05 DIAGNOSIS — Z791 Long term (current) use of non-steroidal anti-inflammatories (NSAID): Secondary | ICD-10-CM | POA: Diagnosis not present

## 2019-02-05 DIAGNOSIS — I129 Hypertensive chronic kidney disease with stage 1 through stage 4 chronic kidney disease, or unspecified chronic kidney disease: Secondary | ICD-10-CM | POA: Diagnosis not present

## 2019-02-05 DIAGNOSIS — R739 Hyperglycemia, unspecified: Secondary | ICD-10-CM | POA: Diagnosis not present

## 2019-02-05 DIAGNOSIS — I69354 Hemiplegia and hemiparesis following cerebral infarction affecting left non-dominant side: Secondary | ICD-10-CM

## 2019-02-05 DIAGNOSIS — D72829 Elevated white blood cell count, unspecified: Secondary | ICD-10-CM | POA: Diagnosis not present

## 2019-02-05 DIAGNOSIS — I63513 Cerebral infarction due to unspecified occlusion or stenosis of bilateral middle cerebral arteries: Secondary | ICD-10-CM | POA: Diagnosis not present

## 2019-02-05 DIAGNOSIS — M109 Gout, unspecified: Secondary | ICD-10-CM | POA: Diagnosis not present

## 2019-02-05 DIAGNOSIS — G9341 Metabolic encephalopathy: Secondary | ICD-10-CM | POA: Diagnosis not present

## 2019-02-05 HISTORY — PX: RADIOLOGY WITH ANESTHESIA: SHX6223

## 2019-02-05 HISTORY — PX: IR CT HEAD LTD: IMG2386

## 2019-02-05 HISTORY — PX: IR PTA INTRACRANIAL: IMG2344

## 2019-02-05 LAB — CBC WITH DIFFERENTIAL/PLATELET
Abs Immature Granulocytes: 0.04 10*3/uL (ref 0.00–0.07)
Basophils Absolute: 0.1 10*3/uL (ref 0.0–0.1)
Basophils Relative: 1 %
Eosinophils Absolute: 0.8 10*3/uL — ABNORMAL HIGH (ref 0.0–0.5)
Eosinophils Relative: 8 %
HCT: 37.1 % (ref 36.0–46.0)
Hemoglobin: 11.5 g/dL — ABNORMAL LOW (ref 12.0–15.0)
Immature Granulocytes: 0 %
Lymphocytes Relative: 10 %
Lymphs Abs: 0.9 10*3/uL (ref 0.7–4.0)
MCH: 28.8 pg (ref 26.0–34.0)
MCHC: 31 g/dL (ref 30.0–36.0)
MCV: 93 fL (ref 80.0–100.0)
Monocytes Absolute: 0.9 10*3/uL (ref 0.1–1.0)
Monocytes Relative: 10 %
Neutro Abs: 6.7 10*3/uL (ref 1.7–7.7)
Neutrophils Relative %: 71 %
Platelets: 234 10*3/uL (ref 150–400)
RBC: 3.99 MIL/uL (ref 3.87–5.11)
RDW: 15.7 % — ABNORMAL HIGH (ref 11.5–15.5)
WBC: 9.4 10*3/uL (ref 4.0–10.5)
nRBC: 0 % (ref 0.0–0.2)

## 2019-02-05 LAB — BASIC METABOLIC PANEL
Anion gap: 10 (ref 5–15)
BUN: 30 mg/dL — ABNORMAL HIGH (ref 8–23)
CO2: 26 mmol/L (ref 22–32)
Calcium: 8.4 mg/dL — ABNORMAL LOW (ref 8.9–10.3)
Chloride: 104 mmol/L (ref 98–111)
Creatinine, Ser: 0.88 mg/dL (ref 0.44–1.00)
GFR calc Af Amer: >60 mL/min (ref >60)
GFR calc non Af Amer: >60 mL/min (ref >60)
Glucose, Bld: 103 mg/dL — ABNORMAL HIGH (ref 70–99)
Potassium: 3.3 mmol/L — ABNORMAL LOW (ref 3.5–5.1)
Sodium: 140 mmol/L (ref 135–145)

## 2019-02-05 LAB — POCT ACTIVATED CLOTTING TIME
Activated Clotting Time: 158 seconds
Activated Clotting Time: 169 seconds

## 2019-02-05 LAB — LIPID PANEL
Cholesterol: 196 mg/dL (ref 0–200)
HDL: 39 mg/dL — ABNORMAL LOW (ref >40)
LDL Cholesterol: 128 mg/dL — ABNORMAL HIGH (ref 0–99)
Total CHOL/HDL Ratio: 5 RATIO
Triglycerides: 146 mg/dL (ref <150)
VLDL: 29 mg/dL (ref 0–40)

## 2019-02-05 LAB — CULTURE, URINE COMPREHENSIVE

## 2019-02-05 LAB — GLUCOSE, CAPILLARY: Glucose-Capillary: 89 mg/dL (ref 70–99)

## 2019-02-05 SURGERY — RADIOLOGY WITH ANESTHESIA
Anesthesia: General

## 2019-02-05 MED ORDER — TICAGRELOR 90 MG PO TABS
90.0000 mg | ORAL_TABLET | Freq: Two times a day (BID) | ORAL | Status: DC
  Administered 2019-02-06 – 2019-02-11 (×10): 90 mg via ORAL
  Filled 2019-02-05 (×11): qty 1

## 2019-02-05 MED ORDER — SODIUM CHLORIDE 0.9 % IV SOLN: INTRAVENOUS | Status: DC | PRN

## 2019-02-05 MED ORDER — CEFAZOLIN SODIUM-DEXTROSE 2-4 GM/100ML-% IV SOLN
INTRAVENOUS | Status: AC
  Filled 2019-02-05: qty 100

## 2019-02-05 MED ORDER — ASPIRIN EC 81 MG PO TBEC
81.0000 mg | DELAYED_RELEASE_TABLET | Freq: Every day | ORAL | Status: DC
  Administered 2019-02-06 – 2019-02-11 (×6): 81 mg via ORAL
  Filled 2019-02-05 (×6): qty 1

## 2019-02-05 MED ORDER — SENNOSIDES-DOCUSATE SODIUM 8.6-50 MG PO TABS: 1.0000 | ORAL_TABLET | Freq: Every evening | ORAL | Status: DC | PRN

## 2019-02-05 MED ORDER — TICAGRELOR 90 MG PO TABS
90.0000 mg | ORAL_TABLET | Freq: Two times a day (BID) | ORAL | Status: DC
  Administered 2019-02-08: 90 mg
  Filled 2019-02-05 (×3): qty 1

## 2019-02-05 MED ORDER — STROKE: EARLY STAGES OF RECOVERY BOOK
Freq: Once | Status: AC
  Administered 2019-02-05: 22:00:00
  Filled 2019-02-05 (×2): qty 1

## 2019-02-05 MED ORDER — EPTIFIBATIDE 20 MG/10ML IV SOLN
INTRAVENOUS | Status: DC | PRN
  Administered 2019-02-05 (×3): 1.8 mg

## 2019-02-05 MED ORDER — ACETAMINOPHEN 325 MG PO TABS
650.0000 mg | ORAL_TABLET | ORAL | Status: DC | PRN
  Administered 2019-02-06 – 2019-02-11 (×4): 650 mg via ORAL
  Filled 2019-02-05 (×4): qty 2

## 2019-02-05 MED ORDER — IOHEXOL 300 MG/ML  SOLN
100.0000 mL | Freq: Once | INTRAMUSCULAR | Status: AC | PRN
  Administered 2019-02-05: 100 mL via INTRA_ARTERIAL

## 2019-02-05 MED ORDER — PROPOFOL 10 MG/ML IV BOLUS
INTRAVENOUS | Status: DC | PRN
  Administered 2019-02-05: 100 mg via INTRAVENOUS

## 2019-02-05 MED ORDER — TIROFIBAN HCL IN NACL 5-0.9 MG/100ML-% IV SOLN
INTRAVENOUS | Status: AC
  Filled 2019-02-05: qty 100

## 2019-02-05 MED ORDER — GADOBUTROL 1 MMOL/ML IV SOLN
10.0000 mL | Freq: Once | INTRAVENOUS | Status: AC | PRN
  Administered 2019-02-05: 10 mL via INTRAVENOUS

## 2019-02-05 MED ORDER — HEPARIN SODIUM (PORCINE) 5000 UNIT/ML IJ SOLN
5000.0000 [IU] | Freq: Three times a day (TID) | INTRAMUSCULAR | Status: DC
  Administered 2019-02-05 – 2019-02-11 (×16): 5000 [IU] via SUBCUTANEOUS
  Filled 2019-02-05 (×17): qty 1

## 2019-02-05 MED ORDER — ASPIRIN 325 MG PO TABS: 325.0000 mg | ORAL_TABLET | Freq: Every day | ORAL | 0 refills | Status: DC

## 2019-02-05 MED ORDER — CLOPIDOGREL BISULFATE 300 MG PO TABS
ORAL_TABLET | ORAL | Status: AC
  Filled 2019-02-05: qty 1

## 2019-02-05 MED ORDER — SUGAMMADEX SODIUM 200 MG/2ML IV SOLN
INTRAVENOUS | Status: DC | PRN
  Administered 2019-02-05: 200 mg via INTRAVENOUS

## 2019-02-05 MED ORDER — ASPIRIN 81 MG PO CHEW: 81.0000 mg | CHEWABLE_TABLET | Freq: Every day | ORAL | Status: DC

## 2019-02-05 MED ORDER — CHLORHEXIDINE GLUCONATE CLOTH 2 % EX PADS
6.0000 | MEDICATED_PAD | Freq: Every day | CUTANEOUS | Status: DC
  Administered 2019-02-05 – 2019-02-10 (×6): 6 via TOPICAL

## 2019-02-05 MED ORDER — SODIUM CHLORIDE 0.9 % IV SOLN
1.0000 g | INTRAVENOUS | Status: AC
  Administered 2019-02-05 – 2019-02-07 (×3): 1 g via INTRAVENOUS
  Filled 2019-02-05 (×3): qty 1

## 2019-02-05 MED ORDER — ASPIRIN 325 MG PO TABS
325.0000 mg | ORAL_TABLET | Freq: Every day | ORAL | Status: DC
  Administered 2019-02-05: 325 mg via ORAL
  Filled 2019-02-05: qty 1

## 2019-02-05 MED ORDER — PHENYLEPHRINE HCL (PRESSORS) 10 MG/ML IV SOLN
INTRAVENOUS | Status: DC | PRN
  Administered 2019-02-05 (×2): 40 ug via INTRAVENOUS

## 2019-02-05 MED ORDER — TICAGRELOR 60 MG PO TABS
ORAL_TABLET | ORAL | Status: DC | PRN
  Administered 2019-02-05: 180 mg

## 2019-02-05 MED ORDER — DEXAMETHASONE SODIUM PHOSPHATE 10 MG/ML IJ SOLN
INTRAMUSCULAR | Status: DC | PRN
  Administered 2019-02-05: 4 mg via INTRAVENOUS

## 2019-02-05 MED ORDER — ATORVASTATIN CALCIUM 20 MG PO TABS
80.0000 mg | ORAL_TABLET | Freq: Every day | ORAL | Status: DC
  Administered 2019-02-05: 80 mg via ORAL
  Filled 2019-02-05: qty 4

## 2019-02-05 MED ORDER — CLEVIDIPINE BUTYRATE 0.5 MG/ML IV EMUL
0.0000 mg/h | INTRAVENOUS | Status: AC
  Administered 2019-02-05 – 2019-02-06 (×2): 2 mg/h via INTRAVENOUS
  Administered 2019-02-06: 25 mg/h via INTRAVENOUS
  Administered 2019-02-06: 26 mg/h via INTRAVENOUS
  Administered 2019-02-06: 14 mg/h via INTRAVENOUS
  Administered 2019-02-06: 16 mg/h via INTRAVENOUS
  Administered 2019-02-06: 14 mg/h via INTRAVENOUS
  Filled 2019-02-05: qty 100
  Filled 2019-02-05 (×2): qty 50
  Filled 2019-02-05: qty 100

## 2019-02-05 MED ORDER — SODIUM CHLORIDE 0.9 % IV SOLN
INTRAVENOUS | Status: DC | PRN
  Administered 2019-02-05: 25 ug/min via INTRAVENOUS

## 2019-02-05 MED ORDER — CEFAZOLIN SODIUM-DEXTROSE 2-3 GM-%(50ML) IV SOLR
INTRAVENOUS | Status: DC | PRN
  Administered 2019-02-05: 2 g via INTRAVENOUS

## 2019-02-05 MED ORDER — PANTOPRAZOLE SODIUM 40 MG IV SOLR
40.0000 mg | INTRAVENOUS | Status: DC
  Administered 2019-02-05 – 2019-02-07 (×3): 40 mg via INTRAVENOUS
  Filled 2019-02-05 (×3): qty 40

## 2019-02-05 MED ORDER — TICAGRELOR 90 MG PO TABS
ORAL_TABLET | ORAL | Status: AC
  Filled 2019-02-05: qty 2

## 2019-02-05 MED ORDER — ONDANSETRON HCL 4 MG/2ML IJ SOLN
INTRAMUSCULAR | Status: DC | PRN
  Administered 2019-02-05: 4 mg via INTRAVENOUS

## 2019-02-05 MED ORDER — ACETAMINOPHEN 650 MG RE SUPP: 650.0000 mg | RECTAL | Status: DC | PRN

## 2019-02-05 MED ORDER — TICAGRELOR 90 MG PO TABS: 90.0000 mg | ORAL_TABLET | Freq: Two times a day (BID) | ORAL | Status: DC

## 2019-02-05 MED ORDER — LABETALOL HCL 5 MG/ML IV SOLN
INTRAVENOUS | Status: DC | PRN
  Administered 2019-02-05 (×2): 2.5 mg via INTRAVENOUS
  Administered 2019-02-05: 5 mg via INTRAVENOUS
  Administered 2019-02-05: 20 mg via INTRAVENOUS
  Administered 2019-02-05 (×2): 10 mg via INTRAVENOUS

## 2019-02-05 MED ORDER — SUCCINYLCHOLINE CHLORIDE 200 MG/10ML IV SOSY
PREFILLED_SYRINGE | INTRAVENOUS | Status: DC | PRN
  Administered 2019-02-05: 120 mg via INTRAVENOUS

## 2019-02-05 MED ORDER — POTASSIUM CHLORIDE CRYS ER 20 MEQ PO TBCR
40.0000 meq | EXTENDED_RELEASE_TABLET | Freq: Once | ORAL | Status: AC
  Administered 2019-02-05: 40 meq via ORAL
  Filled 2019-02-05: qty 2

## 2019-02-05 MED ORDER — LACTATED RINGERS IV SOLN
INTRAVENOUS | Status: DC | PRN
  Administered 2019-02-05: 18:00:00 via INTRAVENOUS

## 2019-02-05 MED ORDER — IOHEXOL 350 MG/ML SOLN
115.0000 mL | Freq: Once | INTRAVENOUS | Status: AC | PRN
  Administered 2019-02-05: 115 mL via INTRAVENOUS

## 2019-02-05 MED ORDER — ACETAMINOPHEN 160 MG/5ML PO SOLN: 650.0000 mg | ORAL | Status: DC | PRN

## 2019-02-05 MED ORDER — ACETAMINOPHEN 325 MG PO TABS: 650.0000 mg | ORAL_TABLET | ORAL | Status: DC | PRN

## 2019-02-05 MED ORDER — IPRATROPIUM-ALBUTEROL 0.5-2.5 (3) MG/3ML IN SOLN
3.0000 mL | Freq: Three times a day (TID) | RESPIRATORY_TRACT | Status: DC
  Administered 2019-02-05: 3 mL via RESPIRATORY_TRACT
  Filled 2019-02-05: qty 3

## 2019-02-05 MED ORDER — ROCURONIUM BROMIDE 100 MG/10ML IV SOLN
INTRAVENOUS | Status: DC | PRN
  Administered 2019-02-05: 10 mg via INTRAVENOUS
  Administered 2019-02-05: 40 mg via INTRAVENOUS

## 2019-02-05 MED ORDER — NITROGLYCERIN 1 MG/10 ML FOR IR/CATH LAB
INTRA_ARTERIAL | Status: AC
  Filled 2019-02-05: qty 10

## 2019-02-05 MED ORDER — ASPIRIN 81 MG PO CHEW
CHEWABLE_TABLET | ORAL | Status: AC
  Filled 2019-02-05: qty 1

## 2019-02-05 MED ORDER — LABETALOL HCL 5 MG/ML IV SOLN
10.0000 mg | Freq: Once | INTRAVENOUS | Status: AC
  Administered 2019-02-05: 22:00:00 10 mg via INTRAVENOUS
  Filled 2019-02-05: qty 4

## 2019-02-05 MED ORDER — SODIUM CHLORIDE 0.9 % IV SOLN
INTRAVENOUS | Status: DC
  Administered 2019-02-06 – 2019-02-07 (×3): via INTRAVENOUS

## 2019-02-05 MED ORDER — EPTIFIBATIDE 20 MG/10ML IV SOLN
INTRAVENOUS | Status: AC
  Filled 2019-02-05: qty 10

## 2019-02-05 MED ORDER — IOHEXOL 300 MG/ML  SOLN
150.0000 mL | Freq: Once | INTRAMUSCULAR | Status: AC | PRN
  Administered 2019-02-05: 44 mL via INTRA_ARTERIAL

## 2019-02-05 MED ORDER — LEVOTHYROXINE SODIUM 100 MCG PO TABS
100.0000 ug | ORAL_TABLET | Freq: Every day | ORAL | Status: DC
  Administered 2019-02-09 – 2019-02-11 (×3): 100 ug via ORAL
  Filled 2019-02-05 (×4): qty 1

## 2019-02-05 MED ORDER — LIDOCAINE HCL (CARDIAC) PF 100 MG/5ML IV SOSY
PREFILLED_SYRINGE | INTRAVENOUS | Status: DC | PRN
  Administered 2019-02-05: 60 mg via INTRATRACHEAL

## 2019-02-05 MED ORDER — AZITHROMYCIN 500 MG PO TABS
500.0000 mg | ORAL_TABLET | Freq: Every day | ORAL | Status: DC
  Administered 2019-02-05: 500 mg via ORAL
  Filled 2019-02-05: qty 1

## 2019-02-05 MED ORDER — SODIUM CHLORIDE 0.9 % IV SOLN
INTRAVENOUS | Status: DC
  Administered 2019-02-05 – 2019-02-10 (×7): via INTRAVENOUS

## 2019-02-05 MED ORDER — HEPARIN SODIUM (PORCINE) 1000 UNIT/ML IJ SOLN
INTRAMUSCULAR | Status: DC | PRN
  Administered 2019-02-05 (×2): 1000 [IU] via INTRAVENOUS

## 2019-02-05 NOTE — Anesthesia Procedure Notes (Signed)
Arterial Line Insertion Start/End9/26/2020 5:58 PM, 02/05/2019 6:01 PM Performed by: Oleta Mouse, MD, anesthesiologist  Patient location: OR. Preanesthetic checklist: patient identified, IV checked, site marked, risks and benefits discussed, surgical consent, monitors and equipment checked, pre-op evaluation, timeout performed and anesthesia consent Left, radial was placed Catheter size: 20 G Hand hygiene performed  and maximum sterile barriers used   Attempts: 1 Procedure performed without using ultrasound guided technique. Following insertion, dressing applied and Biopatch. Post procedure assessment: normal and unchanged  Patient tolerated the procedure well with no immediate complications.

## 2019-02-05 NOTE — Consult Note (Addendum)
Referring Physician: Lanney Gins    Chief Complaint: Left sided weakness  HPI: Christy Carter is an 85 y.o. female admitted on 9/24 with UTI and possible PNA.  On yesterday noted by therapy to have left sided weakness.  When evaluated by her physician no focal abnormalities were noted.  This morning was noted again to have left sided weakness.  Consult called for further recommendations.  LKW per nursing notes was 0200 when evaluation noted no focal abnormalities.  Initial NIHSS of 7. Patient reports a history of stroke many years ago that affected her left side but left her with no residual.    Date last known well: Date: 02/05/2019 Time last known well: Time: 02:00 tPA Given: No: Outside time window  Past Medical History:  Diagnosis Date  . Arthritis   . Carpal tunnel syndrome   . Complication of anesthesia    Difficulty waking up. Believes it took 2 days to wake up after her surgery.  . Depression   . Hyperlipidemia   . Hypertension   . Shortness of breath   . Stroke ([place])   . Thyroid disease     Past Surgical History:  Procedure Laterality Date  . APPENDECTOMY    . BACK SURGERY    . CHOLECYSTECTOMY    . HERNIA REPAIR      Family History  Problem Relation Age of Onset  . Heart disease Mother   . Hypertension Mother   . Diverticulitis Mother   . Heart disease Father   . Hypertension Father    Social History:  reports that she quit smoking about 26 years ago. Her smoking use included cigarettes. She has never used smokeless tobacco. She reports that she does not drink alcohol or use drugs.  Allergies: No Known Allergies  Medications:  I have reviewed the patient's current medications. Prior to Admission:  Medications Prior to Admission  Medication Sig Dispense Refill Last Dose  . allopurinol (ZYLOPRIM) 100 MG tablet Take 2 tabs at bed time for gout 60 tablet 6 02/02/2019 at 2000  . bisoprolol-hydrochlorothiazide (ZIAC) 2.5-6.25 MG tablet Take 1 tablet by mouth daily.  30 tablet 3 02/02/2019 at 0800  . celecoxib (CELEBREX) 200 MG capsule Take 1 capsule (200 mg total) by mouth daily. 30 capsule 3 02/02/2019 at 0800  . Cyanocobalamin (B-12) 2500 MCG TABS Take 2,500 mcg by mouth daily. As needed for lethargy   Past Week at Unknown time  . gabapentin (NEURONTIN) 100 MG capsule Take 1 tab po twice a day for gout 60 capsule 0 02/02/2019 at 2000  . levothyroxine (SYNTHROID, LEVOTHROID) 100 MCG tablet Take 1 tablet (100 mcg total) by mouth daily. (Patient taking differently: Take 100 mcg by mouth daily before breakfast. ) 90 tablet 3 02/02/2019 at 0700  . nitrofurantoin, macrocrystal-monohydrate, (MACROBID) 100 MG capsule Take 1 capsule (100 mg total) by mouth 2 (two) times daily. 14 capsule 0 02/02/2019 at 2000  . omeprazole (PRILOSEC) 40 MG capsule Take 1 capsule (40 mg total) by mouth daily. 90 capsule 2 02/02/2019 at 2000  . potassium chloride (K-DUR) 10 MEQ tablet Take 10 mEq by mouth 2 (two) times daily.   02/02/2019 at 2000  . triamterene-hydrochlorothiazide (MAXZIDE-25) 37.5-25 MG tablet TAKE 1 TABLET BY MOUTH EVERY DAY FOR BLOOD PRESSURE 90 tablet 1 02/02/2019 at 0800  . Vitamin D, Ergocalciferol, (DRISDOL) 1.25 MG (50000 UT) CAPS capsule Take 1 capsule (50,000 Units total) by mouth every 7 (seven) days. 10 capsule 0 Past Week at Unknown time  .  vitamin E 400 UNIT capsule Take 400 Units by mouth daily.   Past Week at Unknown time  . colchicine 0.6 MG tablet Take 1 tablet (0.6 mg total) by mouth 2 (two) times daily. 10 tablet 1 prn at prn  . HYDROcodone-acetaminophen (NORCO/VICODIN) 5-325 MG tablet Take 1 tablet by mouth 3 (three) times daily as needed for moderate pain. 15 tablet 0   . ibuprofen (ADVIL) 200 MG tablet Take 200 mg by mouth every 6 (six) hours as needed.   prn at prn  . pneumococcal 23 valent vaccine (PNEUMOVAX 23) 25 MCG/0.5ML injection Inject 0.56ml IM once 0.5 mL 0   . VENTOLIN HFA 108 (90 Base) MCG/ACT inhaler TAKE 2 PUFFS BY MOUTH EVERY 6 HOURS AS NEEDED  FOR WHEEZE OR SHORTNESS OF BREATH 18 Inhaler 1 prn at prn   Scheduled: . allopurinol  100 mg Oral Daily  . aspirin  325 mg Oral Daily  . atorvastatin  80 mg Oral q1800  . azithromycin  500 mg Oral q1800  . chlorhexidine  15 mL Mouth Rinse BID  . Chlorhexidine Gluconate Cloth  6 each Topical Daily  . docusate sodium  100 mg Oral BID  . enoxaparin (LOVENOX) injection  40 mg Subcutaneous Q24H  . gabapentin  100 mg Oral BID  . ipratropium-albuterol  3 mL Nebulization TID  . levothyroxine  100 mcg Oral QAC breakfast  . mouth rinse  15 mL Mouth Rinse q12n4p  . pantoprazole  40 mg Oral Daily  . vitamin B-12  2,500 mcg Oral Daily  . vitamin E  400 Units Oral Daily    ROS: History obtained from the patient  General ROS: negative for - chills, fatigue, fever, night sweats, weight gain or weight loss Psychological ROS: negative for - behavioral disorder, hallucinations, memory difficulties, mood swings or suicidal ideation Ophthalmic ROS: negative for - blurry vision, double vision, eye pain or loss of vision ENT ROS: negative for - epistaxis, nasal discharge, oral lesions, sore throat, tinnitus or vertigo Allergy and Immunology ROS: negative for - hives or itchy/watery eyes Hematological and Lymphatic ROS: negative for - bleeding problems, bruising or swollen lymph nodes Endocrine ROS: negative for - galactorrhea, hair pattern changes, polydipsia/polyuria or temperature intolerance Respiratory ROS: shortness of breath Cardiovascular ROS: negative for - chest pain, dyspnea on exertion, edema or irregular heartbeat Gastrointestinal ROS: negative for - abdominal pain, diarrhea, hematemesis, nausea/vomiting or stool incontinence Genito-Urinary ROS: negative for - dysuria, hematuria, incontinence or urinary frequency/urgency Musculoskeletal ROS: negative for - joint swelling or muscular weakness Neurological ROS: as noted in HPI Dermatological ROS: negative for rash and skin lesion  changes  Physical Examination: Blood pressure 115/64, pulse 95, temperature (!) 97.5 F (36.4 C), temperature source Oral, resp. rate 17, height 5\' 6"  (1.676 m), weight 102.5 kg, SpO2 95 %.  HEENT-  Normocephalic, no lesions, without obvious abnormality.  Normal external eye and conjunctiva.  Normal TM's bilaterally.  Normal auditory canals and external ears. Normal external nose, mucus membranes and septum.  Normal pharynx. Cardiovascular- S1, S2 normal, pulses palpable throughout   Lungs- chest clear, no wheezing, rales, normal symmetric air entry Abdomen- soft, non-tender; bowel sounds normal; no masses,  no organomegaly Extremities- LUE swelling Lymph-no adenopathy palpable Musculoskeletal-no joint tenderness, deformity or swelling Skin-warm and dry, no hyperpigmentation, vitiligo, or suspicious lesions  Neurological Examination   Mental Status: Alert, oriented, thought content appropriate.  Speech fluent without evidence of aphasia but dysarthria noted.  Able to follow 3 step commands without difficulty.  Cranial Nerves: II: Discs flat bilaterally; Visual fields grossly normal, pupils equal, round, reactive to light and accommodation III,IV, VI: ptosis not present, extra-ocular motions intact bilaterally V,VII: decrease in right NLF, facial light touch sensation normal bilaterally VIII: hearing normal bilaterally IX,X: gag reflex present XI: bilateral shoulder shrug XII: midline tongue extension Motor: Right : Upper extremity   5/5    Left:     Upper extremity   3/5  Lower extremity   5/5     Lower extremity   1/5 Tone and bulk:normal tone throughout; no atrophy noted Sensory: Pinprick and light touch intact throughout, bilaterally Deep Tendon Reflexes: Symmetric throughout Plantars: Right: mute   Left: mute Cerebellar: Normal finger-to-nose on the right.  Unable to perform on the left due to weakness Gait: not tested due to safety concerns    Laboratory Studies:  Basic  Metabolic Panel: Recent Labs  Lab 02/03/19 0942 02/03/19 1510 02/04/19 0401 02/05/19 0547  NA 143  --  145 140  K 3.5  --  3.0* 3.3*  CL 101  --  107 104  CO2 28  --  26 26  GLUCOSE 122*  --  106* 103*  BUN 23  --  23 30*  CREATININE 0.92 0.86 1.00 0.88  CALCIUM 9.2  --  8.3* 8.4*  MG  --   --  1.9  --   PHOS  --   --  3.3  --     Liver Function Tests: No results for input(s): AST, ALT, ALKPHOS, BILITOT, PROT, ALBUMIN in the last 168 hours. No results for input(s): LIPASE, AMYLASE in the last 168 hours. No results for input(s): AMMONIA in the last 168 hours.  CBC: Recent Labs  Lab 02/03/19 0942 02/03/19 1510 02/04/19 0401 02/05/19 0547  WBC 15.8* 21.6* 14.5* 9.4  NEUTROABS 14.4*  --   --  6.7  HGB 13.8 13.4 11.9* 11.5*  HCT 43.3 42.3 37.4 37.1  MCV 91.2 91.2 91.7 93.0  PLT 269 270 243 234    Cardiac Enzymes: Recent Labs  Lab 02/03/19 0942  CKTOTAL 327*    BNP: Invalid input(s): POCBNP  CBG: Recent Labs  Lab 02/03/19 1438 02/04/19 1059 02/05/19 0744  GLUCAP 113* 131* 89    Microbiology: Results for orders placed or performed during the hospital encounter of 02/03/19  Blood culture (routine x 2)     Status: None (Preliminary result)   Collection Time: 02/03/19  9:43 AM   Specimen: BLOOD  Result Value Ref Range Status   Specimen Description BLOOD RIGHT ANTECUBITAL  Final   Special Requests Blood Culture adequate volume  Final   Culture   Final    NO GROWTH 2 DAYS Performed at [facility], [address], [place], [place] [address]    Report Status PENDING  Incomplete  SARS Coronavirus 2 [facility] order, Performed in [place] hospital lab) Nasopharyngeal Nasopharyngeal Swab     Status: None   Collection Time: 02/03/19  9:52 AM   Specimen: Nasopharyngeal Swab  Result Value Ref Range Status   SARS Coronavirus 2 NEGATIVE NEGATIVE Final    Comment: (NOTE) If result is NEGATIVE SARS-CoV-2 target nucleic acids are NOT DETECTED. The  SARS-CoV-2 RNA is generally detectable in upper and lower  respiratory specimens during the acute phase of infection. The lowest  concentration of SARS-CoV-2 viral copies this assay can detect is 250  copies / mL. A negative result does not preclude SARS-CoV-2 infection  and should not be used as  the sole basis for treatment or other  patient management decisions.  A negative result may occur with  improper specimen collection / handling, submission of specimen other  than nasopharyngeal swab, presence of viral mutation(s) within the  areas targeted by this assay, and inadequate number of viral copies  (<250 copies / mL). A negative result must be combined with clinical  observations, patient history, and epidemiological information. If result is POSITIVE SARS-CoV-2 target nucleic acids are DETECTED. The SARS-CoV-2 RNA is generally detectable in upper and lower  respiratory specimens dur ing the acute phase of infection.  Positive  results are indicative of active infection with SARS-CoV-2.  Clinical  correlation with patient history and other diagnostic information is  necessary to determine patient infection status.  Positive results do  not rule out bacterial infection or co-infection with other viruses. If result is PRESUMPTIVE POSTIVE SARS-CoV-2 nucleic acids MAY BE PRESENT.   A presumptive positive result was obtained on the submitted specimen  and confirmed on repeat testing.  While 2019 novel coronavirus  (SARS-CoV-2) nucleic acids may be present in the submitted sample  additional confirmatory testing may be necessary for epidemiological  and / or clinical management purposes  to differentiate between  SARS-CoV-2 and other Sarbecovirus currently known to infect humans.  If clinically indicated additional testing with an alternate test  methodology [phone]) is advised. The SARS-CoV-2 RNA is generally  detectable in upper and lower respiratory sp ecimens during the acute   phase of infection. The expected result is Negative. Fact Sheet for Patients:  StrictlyIdeas.no Fact Sheet for Healthcare Providers: BankingDealers.co.za This test is not yet approved or cleared by the [place] FDA and has been authorized for detection and/or diagnosis of SARS-CoV-2 by FDA under an Emergency Use Authorization (EUA).  This EUA will remain in effect (meaning this test can be used) for the duration of the COVID-19 declaration under Section 564(b)(1) of the Act, 21 U.S.C. section 360bbb-3(b)(1), unless the authorization is terminated or revoked sooner. Performed at [facility], [address] [place], [place] [address]   Blood culture (routine x 2)     Status: None (Preliminary result)   Collection Time: 02/03/19  9:52 AM   Specimen: BLOOD  Result Value Ref Range Status   Specimen Description BLOOD BLOOD LEFT HAND  Final   Special Requests   Final    BOTTLES DRAWN AEROBIC AND ANAEROBIC Blood Culture results may not be optimal due to an inadequate volume of blood received in culture bottles   Culture   Final    NO GROWTH 2 DAYS Performed at [facility], [address], [place], [place] [address]    Report Status PENDING  Incomplete  Urine Culture     Status: Abnormal   Collection Time: 02/03/19 11:22 AM   Specimen: Urine, Random  Result Value Ref Range Status   Specimen Description   Final    URINE, RANDOM Performed at [facility], [address] [place], [place] [address]    Special Requests   Final    NONE Performed at [facility], [address] [place], [place] [address]    Culture MULTIPLE SPECIES PRESENT, SUGGEST RECOLLECTION (A)  Final   Report Status 02/04/2019 FINAL  Final  MRSA PCR Screening     Status: None   Collection Time: 02/03/19  2:51 PM   Specimen: Nasopharyngeal  Result Value Ref Range Status   MRSA by PCR NEGATIVE NEGATIVE Final    Comment:  The GeneXpert MRSA Assay (FDA approved for NASAL specimens only), is one component of a comprehensive MRSA colonization surveillance program. It is not intended to diagnose MRSA infection nor to guide or monitor treatment for MRSA infections. Performed at [facility], [address] [place], [place] [address]     Coagulation Studies: No results for input(s): LABPROT, INR in the last 72 hours.  Urinalysis:  Recent Labs  Lab 02/02/19 1033 02/03/19 1122  COLORURINE  --  YELLOW*  LABSPEC  --  1.014  PHURINE  --  5.0  GLUCOSEU  --  NEGATIVE  HGBUR  --  SMALL*  BILIRUBINUR negative NEGATIVE  KETONESUR  --  5*  PROTEINUR Positive* NEGATIVE  UROBILINOGEN 0.2  --   NITRITE positive NEGATIVE  LEUKOCYTESUR Moderate (2+)* SMALL*    Lipid Panel:    Component Value Date/Time   CHOL 196 02/05/2019 0547   CHOL 98 09/07/2013 0358   TRIG 146 02/05/2019 0547   TRIG 63 09/07/2013 0358   HDL 39 (L) 02/05/2019 0547   HDL 43 09/07/2013 0358   CHOLHDL 5.0 02/05/2019 0547   VLDL 29 02/05/2019 0547   VLDL 13 09/07/2013 0358   LDLCALC 128 (H) 02/05/2019 0547   LDLCALC 42 09/07/2013 0358    HgbA1C:  Lab Results  Component Value Date   HGBA1C 5.9 09/07/2013    Urine Drug Screen:  No results found for: LABOPIA, COCAINSCRNUR, LABBENZ, AMPHETMU, THCU, LABBARB  Alcohol Level: No results for input(s): ETH in the last 168 hours.  Other results: EKG: atrial fibrillation.  Imaging: Ct Head Wo Contrast  Result Date: 02/05/2019 CLINICAL DATA:  Focal neuro deficit.  Left-sided weakness dysarthria EXAM: CT HEAD WITHOUT CONTRAST TECHNIQUE: Contiguous axial images were obtained from the base of the skull through the vertex without intravenous contrast. COMPARISON:  CT head 02/03/2019 FINDINGS: Brain: Moderate atrophy and ventricular enlargement, stable. Patchy hypodensity in the white matter right greater than left, stable. Negative for hemorrhage or mass.  No midline shift.  Vascular: Negative for hyperdense vessel Skull: Negative Sinuses/Orbits: Negative Other: None IMPRESSION: No change from yesterday Moderate atrophy. Microvascular ischemic changes are present a white matter right greater than left, stable from prior study. Electronically Signed   By: Franchot Gallo M.D.   On: 02/05/2019 08:59   Ct Head Wo Contrast  Result Date: 02/03/2019 CLINICAL DATA:  Altered level of consciousness, fall EXAM: CT HEAD WITHOUT CONTRAST TECHNIQUE: Contiguous axial images were obtained from the base of the skull through the vertex without intravenous contrast. COMPARISON:  09/06/2013 FINDINGS: Brain: There is atrophy and chronic small vessel disease changes. No acute intracranial abnormality. Specifically, no hemorrhage, hydrocephalus, mass lesion, acute infarction, or significant intracranial injury. Vascular: No hyperdense vessel or unexpected calcification. Skull: No acute calvarial abnormality. Sinuses/Orbits: Visualized paranasal sinuses and mastoids clear. Orbital soft tissues unremarkable. Other: None IMPRESSION: Atrophy, chronic microvascular disease. No acute intracranial abnormality. Electronically Signed   By: Rolm Baptise M.D.   On: 02/03/2019 18:48   Mr Angio Head Wo Contrast  Result Date: 02/05/2019 CLINICAL DATA:  Stroke.  Left-sided weakness. EXAM: MRI HEAD WITHOUT  CONTRAST MRA HEAD WITHOUT CONTRAST MRA NECK WITHOUT AND WITH CONTRAST TECHNIQUE: Multiplanar, multiecho pulse sequences of the brain and surrounding structures were obtained without intravenous contrast. Angiographic images of the Circle of Willis were obtained using MRA technique without intravenous contrast. Angiographic images of the neck were obtained using MRA technique without and with intravenous contrast. Carotid stenosis measurements (when applicable) are obtained utilizing NASCET  criteria, using the distal internal carotid diameter as the denominator. CONTRAST:  71mL GADAVIST GADOBUTROL 1 MMOL/ML IV SOLN  COMPARISON:  CT head 02/05/2019.  MRI 09/07/2013 FINDINGS: MRI HEAD FINDINGS Brain: Acute infarct in the deep white matter on the right with scattered small areas of acute infarct. Additional small areas of acute infarct in the left parietal periventricular white matter and left parietal cortex. Small acute infarct left lateral posterior temporal lobe. Generalized atrophy with ventricular enlargement similar to 2015. Chronic microvascular ischemic changes in the white matter. Small chronic infarct right cerebellum. Retro cerebellar cyst unchanged. Negative for hemorrhage or mass. Vascular: Normal arterial flow voids. Skull and upper cervical spine: Negative Sinuses/Orbits: Negative Other: None MRA HEAD FINDINGS Left vertebral artery supplies the basilar and is widely patent. Distal right vertebral artery occluded. Mild stenosis proximal basilar. Main basilar is patent. Moderate to severe stenosis right P1 segment. Severe stenosis right P2 segment. Moderate stenosis left P1 segment. Mild atherosclerotic irregularity and mild stenosis in the internal carotid artery bilaterally. Left A1 segment occluded. Both anterior cerebral arteries patent and supplied from the right Severe stenosis right M1 segment with distal flow present. Mild to moderate stenosis proximal left M1. Left middle cerebral artery branches widely patent. Negative for cerebral aneurysm. MRA NECK FINDINGS Normal aortic arch.  Proximal great vessels patent. Irregularity of the proximal right common carotid artery most likely artifact from breathing motion although possibly atherosclerotic stenosis. Carotid bifurcation widely patent bilaterally. Mild to moderate stenosis left internal carotid artery a at the petrous segment. Left vertebral artery widely patent to the basilar without stenosis Occlusion of the proximal and mid right vertebral artery with reconstitution distally. Severely disease distal right vertebral artery which may have minimal  contribution to the basilar. IMPRESSION: 1. Acute infarct deep white matter on the right in a watershed territory, likely due to severe stenosis right M1 segment. 2. Small acute infarct left lateral posterior temporal lobe 3. Carotid bifurcation widely patent in the neck. Mild to moderate stenosis of the left petrous carotid. 4. Left vertebral artery widely patent. Right vertebral artery severely diseased with long segment occlusion and partial reconstitution distally. 5. Moderate to severe stenosis right P1. Severe stenosis right P2 segment. Moderate stenosis left P1 segment. 6. Occluded left A1 segment. Severe stenosis right M1 segment. Mild to moderate stenosis left M1. Electronically Signed   By: Franchot Gallo M.D.   On: 02/05/2019 13:06   Mr Angio Neck W Wo Contrast  Result Date: 02/05/2019 CLINICAL DATA:  Stroke.  Left-sided weakness. EXAM: MRI HEAD WITHOUT  CONTRAST MRA HEAD WITHOUT CONTRAST MRA NECK WITHOUT AND WITH CONTRAST TECHNIQUE: Multiplanar, multiecho pulse sequences of the brain and surrounding structures were obtained without intravenous contrast. Angiographic images of the Circle of Willis were obtained using MRA technique without intravenous contrast. Angiographic images of the neck were obtained using MRA technique without and with intravenous contrast. Carotid stenosis measurements (when applicable) are obtained utilizing NASCET criteria, using the distal internal carotid diameter as the denominator. CONTRAST:  7mL GADAVIST GADOBUTROL 1 MMOL/ML IV SOLN COMPARISON:  CT head 02/05/2019.  MRI 09/07/2013 FINDINGS: MRI HEAD FINDINGS Brain: Acute infarct in the deep white matter on the right with scattered small areas of acute infarct. Additional small areas of acute infarct in the left parietal periventricular white matter and left parietal cortex. Small acute infarct left lateral posterior temporal lobe. Generalized atrophy with ventricular enlargement similar to 2015. Chronic microvascular  ischemic changes in the white matter. Small chronic infarct right cerebellum.  Retro cerebellar cyst unchanged. Negative for hemorrhage or mass. Vascular: Normal arterial flow voids. Skull and upper cervical spine: Negative Sinuses/Orbits: Negative Other: None MRA HEAD FINDINGS Left vertebral artery supplies the basilar and is widely patent. Distal right vertebral artery occluded. Mild stenosis proximal basilar. Main basilar is patent. Moderate to severe stenosis right P1 segment. Severe stenosis right P2 segment. Moderate stenosis left P1 segment. Mild atherosclerotic irregularity and mild stenosis in the internal carotid artery bilaterally. Left A1 segment occluded. Both anterior cerebral arteries patent and supplied from the right Severe stenosis right M1 segment with distal flow present. Mild to moderate stenosis proximal left M1. Left middle cerebral artery branches widely patent. Negative for cerebral aneurysm. MRA NECK FINDINGS Normal aortic arch.  Proximal great vessels patent. Irregularity of the proximal right common carotid artery most likely artifact from breathing motion although possibly atherosclerotic stenosis. Carotid bifurcation widely patent bilaterally. Mild to moderate stenosis left internal carotid artery a at the petrous segment. Left vertebral artery widely patent to the basilar without stenosis Occlusion of the proximal and mid right vertebral artery with reconstitution distally. Severely disease distal right vertebral artery which may have minimal contribution to the basilar. IMPRESSION: 1. Acute infarct deep white matter on the right in a watershed territory, likely due to severe stenosis right M1 segment. 2. Small acute infarct left lateral posterior temporal lobe 3. Carotid bifurcation widely patent in the neck. Mild to moderate stenosis of the left petrous carotid. 4. Left vertebral artery widely patent. Right vertebral artery severely diseased with long segment occlusion and partial  reconstitution distally. 5. Moderate to severe stenosis right P1. Severe stenosis right P2 segment. Moderate stenosis left P1 segment. 6. Occluded left A1 segment. Severe stenosis right M1 segment. Mild to moderate stenosis left M1. Electronically Signed   By: Franchot Gallo M.D.   On: 02/05/2019 13:06   Mr Brain Wo Contrast  Result Date: 02/05/2019 CLINICAL DATA:  Stroke.  Left-sided weakness. EXAM: MRI HEAD WITHOUT  CONTRAST MRA HEAD WITHOUT CONTRAST MRA NECK WITHOUT AND WITH CONTRAST TECHNIQUE: Multiplanar, multiecho pulse sequences of the brain and surrounding structures were obtained without intravenous contrast. Angiographic images of the Circle of Willis were obtained using MRA technique without intravenous contrast. Angiographic images of the neck were obtained using MRA technique without and with intravenous contrast. Carotid stenosis measurements (when applicable) are obtained utilizing NASCET criteria, using the distal internal carotid diameter as the denominator. CONTRAST:  51mL GADAVIST GADOBUTROL 1 MMOL/ML IV SOLN COMPARISON:  CT head 02/05/2019.  MRI 09/07/2013 FINDINGS: MRI HEAD FINDINGS Brain: Acute infarct in the deep white matter on the right with scattered small areas of acute infarct. Additional small areas of acute infarct in the left parietal periventricular white matter and left parietal cortex. Small acute infarct left lateral posterior temporal lobe. Generalized atrophy with ventricular enlargement similar to 2015. Chronic microvascular ischemic changes in the white matter. Small chronic infarct right cerebellum. Retro cerebellar cyst unchanged. Negative for hemorrhage or mass. Vascular: Normal arterial flow voids. Skull and upper cervical spine: Negative Sinuses/Orbits: Negative Other: None MRA HEAD FINDINGS Left vertebral artery supplies the basilar and is widely patent. Distal right vertebral artery occluded. Mild stenosis proximal basilar. Main basilar is patent. Moderate to severe  stenosis right P1 segment. Severe stenosis right P2 segment. Moderate stenosis left P1 segment. Mild atherosclerotic irregularity and mild stenosis in the internal carotid artery bilaterally. Left A1 segment occluded. Both anterior cerebral arteries patent and supplied from the right Severe stenosis right M1 segment  with distal flow present. Mild to moderate stenosis proximal left M1. Left middle cerebral artery branches widely patent. Negative for cerebral aneurysm. MRA NECK FINDINGS Normal aortic arch.  Proximal great vessels patent. Irregularity of the proximal right common carotid artery most likely artifact from breathing motion although possibly atherosclerotic stenosis. Carotid bifurcation widely patent bilaterally. Mild to moderate stenosis left internal carotid artery a at the petrous segment. Left vertebral artery widely patent to the basilar without stenosis Occlusion of the proximal and mid right vertebral artery with reconstitution distally. Severely disease distal right vertebral artery which may have minimal contribution to the basilar. IMPRESSION: 1. Acute infarct deep white matter on the right in a watershed territory, likely due to severe stenosis right M1 segment. 2. Small acute infarct left lateral posterior temporal lobe 3. Carotid bifurcation widely patent in the neck. Mild to moderate stenosis of the left petrous carotid. 4. Left vertebral artery widely patent. Right vertebral artery severely diseased with long segment occlusion and partial reconstitution distally. 5. Moderate to severe stenosis right P1. Severe stenosis right P2 segment. Moderate stenosis left P1 segment. 6. Occluded left A1 segment. Severe stenosis right M1 segment. Mild to moderate stenosis left M1. Electronically Signed   By: Franchot Gallo M.D.   On: 02/05/2019 13:06   Dg Chest Port 1 View  Result Date: 02/04/2019 CLINICAL DATA:  Acute respiratory failure. EXAM: PORTABLE CHEST 1 VIEW COMPARISON:  02/03/2019 and  earlier exams. FINDINGS: Vascular congestion and interstitial thickening is stable from the previous day's study. No lung consolidation. No convincing pleural effusion and no pneumothorax. Mild streaky basilar atelectasis. Cardiac silhouette normal in size. IMPRESSION: 1. Stable exam from the previous day's study. 2. Vascular congestion with interstitial prominence. No evidence of pneumonia. Electronically Signed   By: Lajean Manes M.D.   On: 02/04/2019 09:17    Assessment: 85 y.o. female admitted with infection noted to have left sided weakness.  Head CT performed and shows no acute changes.  MRI of the brain performed and shows bilateral small acute infarcts with the largest being in the deep white matter watershed territory on the right.  Infarcts likely embolic in etiology.  MRA shows severe right M1 stenosis and multiple other arterial stenoses including right vertebral oclusion, moderate to severe right P1 stenosis, severe right P2 stenosis, occluded left A1 and mild to moderate left M1 stenosis.  Case discussed with daughter who is in favor of aggressive management.  Case then discussed with IR at [facility].  Patient to have further imaging and transfer to be initiated.     Stroke Risk Factors - atrial fibrillation, hyperlipidemia and hypertension  Plan: 1. CTA of the head and neck, CTP now 2. ASA 325mg  now.  Will delay start of anticoagulation until further decisions about intervention made.   3. Echocardiogram 4. NPO until RN stroke swallow screen 5. Telemetry monitoring 6. Frequent neuro checks 7. Patient will require PT/OT and speech therapy consults 8. HgbA1c, fasting lipid panel   Alexis Goodell, MD Neurology [phone] 02/05/2019, 2:35 PM  CTA and CTP reviewed with radiology.  Case discussed again with IR.  They are requesting transfer.  Arranging with [facility] neurology.  Family and patient are in agreement.    This patient is critically ill and at significant risk of neurological  worsening, death and care requires constant monitoring of vital signs, hemodynamics,respiratory and cardiac monitoring, neurological assessment, discussion with family, other specialists and medical decision making of high complexity. I spent 180 minutes of neurocritical care time  in the care of  this patient. Verbal consent obtained from daughter, son and patient for diagnostic cerebral angiogram with possible stenting of near occluded right M1 for the purpose of salvage of at risk cerebral tissue.  Risks include stroke and 10% cerebral hemorrhage.   Alexis Goodell, MD Neurology [phone] 02/05/2019  4:09 PM

## 2019-02-05 NOTE — Progress Notes (Signed)
CRITICAL CARE PROGRESS NOTE    Name: Christy Carter MRN: [id number] DOB: 02/11/1934     LOS: 2   SUBJECTIVE FINDINGS & SIGNIFICANT EVENTS   -Was alerted by RN that patient is exhibiting Left sided weakness.  Grandaughter at bedside states this was a new finding but she is uncertain when symptoms started as patient has been asleep all night. Last known normal 2AM per RN charting.    -Similar episode was reported by PT yesterday but upon my neuroeval at that time with presence of NP and RN, patient neurologically grossly intact with no FNDs  STAT CTH was done and is negative.  Patient was discussed with neurologist and consultation ordered.  MRA/MRI w/o is in progress.      PAST MEDICAL HISTORY   Past Medical History:  Diagnosis Date  . Arthritis   . Carpal tunnel syndrome   . Complication of anesthesia    Difficulty waking up. Believes it took 2 days to wake up after her surgery.  . Depression   . Hyperlipidemia   . Hypertension   . Shortness of breath   . Stroke ([place])   . Thyroid disease      SURGICAL HISTORY   Past Surgical History:  Procedure Laterality Date  . APPENDECTOMY    . BACK SURGERY    . CHOLECYSTECTOMY    . HERNIA REPAIR       FAMILY HISTORY   Family History  Problem Relation Age of Onset  . Heart disease Mother   . Hypertension Mother   . Diverticulitis Mother   . Heart disease Father   . Hypertension Father      SOCIAL HISTORY   Social History   Tobacco Use  . Smoking status: Former Smoker    Types: Cigarettes    Quit date: 08/15/1992    Years since quitting: 26.4  . Smokeless tobacco: Never Used  Substance Use Topics  . Alcohol use: No  . Drug use: No     MEDICATIONS   Current Medication:  Current Facility-Administered Medications:  .  acetaminophen  (TYLENOL) tablet 650 mg, 650 mg, Oral, Q6H PRN **OR** acetaminophen (TYLENOL) suppository 650 mg, 650 mg, Rectal, Q6H PRN, Epifanio Lesches, MD .  allopurinol (ZYLOPRIM) tablet 100 mg, 100 mg, Oral, Daily, Epifanio Lesches, MD, 100 mg at 02/04/19 1110 .  azithromycin (ZITHROMAX) 500 mg in sodium chloride 0.9 % 250 mL IVPB, 500 mg, Intravenous, Q24H, Epifanio Lesches, MD, Last Rate: 250 mL/hr at 02/04/19 1749, 500 mg at 02/04/19 1749 .  bisacodyl (DULCOLAX) EC tablet 5 mg, 5 mg, Oral, Daily PRN, Epifanio Lesches, MD .  cefTRIAXone (ROCEPHIN) 1 g in sodium chloride 0.9 % 100 mL IVPB, 1 g, Intravenous, Q24H, Epifanio Lesches, MD, Last Rate: 200 mL/hr at 02/04/19 2000 .  celecoxib (CELEBREX) capsule 200 mg, 200 mg, Oral, Daily, Epifanio Lesches, MD, 200 mg at 02/04/19 1110 .  chlorhexidine (PERIDEX) 0.12 % solution 15 mL, 15 mL, Mouth Rinse, BID, Epifanio Lesches, MD, 15 mL at 02/04/19 2128 .  Chlorhexidine Gluconate Cloth 2 % PADS 6 each, 6 each, Topical, Daily, Epifanio Lesches, MD, Stopped at 02/04/19 2042 .  docusate sodium (COLACE) capsule 100 mg, 100 mg, Oral, BID, Epifanio Lesches, MD, 100 mg at 02/04/19 2128 .  enoxaparin (LOVENOX) injection 40 mg, 40 mg, Subcutaneous, Q24H, Epifanio Lesches, MD, 40 mg at 02/04/19 2128 .  gabapentin (NEURONTIN) capsule 100 mg, 100 mg, Oral, BID, Epifanio Lesches, MD, 100 mg at 02/04/19 2128 .  HYDROcodone-acetaminophen (  NORCO/VICODIN) 5-325 MG per tablet 1 tablet, 1 tablet, Oral, TID PRN, Epifanio Lesches, MD, 1 tablet at 02/04/19 2135 .  ipratropium-albuterol (DUONEB) 0.5-2.5 (3) MG/3ML nebulizer solution 3 mL, 3 mL, Nebulization, Q6H, Epifanio Lesches, MD, 3 mL at 02/05/19 0134 .  ipratropium-albuterol (DUONEB) 0.5-2.5 (3) MG/3ML nebulizer solution 3 mL, 3 mL, Nebulization, Q6H PRN, Awilda Bill, NP .  levothyroxine (SYNTHROID) tablet 100 mcg, 100 mcg, Oral, QAC breakfast, Epifanio Lesches, MD, 100 mcg  at 02/05/19 0521 .  MEDLINE mouth rinse, 15 mL, Mouth Rinse, q12n4p, Epifanio Lesches, MD, 15 mL at 02/03/19 1748 .  ondansetron (ZOFRAN) tablet 4 mg, 4 mg, Oral, Q6H PRN **OR** ondansetron (ZOFRAN) injection 4 mg, 4 mg, Intravenous, Q6H PRN, Epifanio Lesches, MD .  pantoprazole (PROTONIX) EC tablet 40 mg, 40 mg, Oral, Daily, Epifanio Lesches, MD, 40 mg at 02/04/19 1110 .  traZODone (DESYREL) tablet 25 mg, 25 mg, Oral, QHS PRN, Epifanio Lesches, MD .  vitamin B-12 (CYANOCOBALAMIN) tablet 2,500 mcg, 2,500 mcg, Oral, Daily, Epifanio Lesches, MD, 2,500 mcg at 02/04/19 1108 .  vitamin E capsule 400 Units, 400 Units, Oral, Daily, Epifanio Lesches, MD, 400 Units at 02/04/19 1111    ALLERGIES   Patient has no known allergies.    REVIEW OF SYSTEMS     N point ROS conducted and is negative except as per HPI and subjective findings  PHYSICAL EXAMINATION   Vital Signs: Temp:  [97.7 F (36.5 C)-98.9 F (37.2 C)] 97.7 F (36.5 C) (09/26 0200) Pulse Rate:  [69-106] 87 (09/26 0400) Resp:  [15-28] 21 (09/26 0400) BP: (127-170)/(38-146) 161/61 (09/26 0400) SpO2:  [89 %-100 %] 97 % (09/26 0400) FiO2 (%):  [28 %] 28 % (09/25 0744) Weight:  [102.5 kg] 102.5 kg (09/26 0232)  GENERAL: No apparent distress HEAD: Normocephalic, atraumatic.  EYES: Pupils equal, round, reactive to light.  No scleral icterus.  MOUTH: Moist mucosal membrane. NECK: Supple. No thyromegaly. No nodules. No JVD.  PULMONARY: Mild bibasilar crackles CARDIOVASCULAR: S1 and S2. Regular rate and rhythm. No murmurs, rubs, or gallops.  GASTROINTESTINAL: Soft, nontender, non-distended. No masses. Positive bowel sounds. No hepatosplenomegaly.  MUSCULOSKELETAL: No swelling, clubbing, or edema.  NEUROLOGIC: Mild distress due to acute illness SKIN:intact,warm,dry   PERTINENT DATA     Infusions: . azithromycin 500 mg (02/04/19 1749)  . cefTRIAXone (ROCEPHIN)  IV 200 mL/hr at 02/04/19 2000    Scheduled Medications: . allopurinol  100 mg Oral Daily  . celecoxib  200 mg Oral Daily  . chlorhexidine  15 mL Mouth Rinse BID  . Chlorhexidine Gluconate Cloth  6 each Topical Daily  . docusate sodium  100 mg Oral BID  . enoxaparin (LOVENOX) injection  40 mg Subcutaneous Q24H  . gabapentin  100 mg Oral BID  . ipratropium-albuterol  3 mL Nebulization Q6H  . levothyroxine  100 mcg Oral QAC breakfast  . mouth rinse  15 mL Mouth Rinse q12n4p  . pantoprazole  40 mg Oral Daily  . vitamin B-12  2,500 mcg Oral Daily  . vitamin E  400 Units Oral Daily   PRN Medications: acetaminophen **OR** acetaminophen, bisacodyl, HYDROcodone-acetaminophen, ipratropium-albuterol, ondansetron **OR** ondansetron (ZOFRAN) IV, traZODone Hemodynamic parameters:   Intake/Output: 09/25 0701 - 09/26 0700 In: 1399.1 [P.O.:480; IV Piggyback:919.1] Out: 750 [Urine:750]  Ventilator  Settings: FiO2 (%):  [28 %] 28 %    LAB RESULTS:  Basic Metabolic Panel: Recent Labs  Lab 02/03/19 0942 02/03/19 1510 02/04/19 0401 02/05/19 0547  NA 143  --  145  140  K 3.5  --  3.0* 3.3*  CL 101  --  107 104  CO2 28  --  26 26  GLUCOSE 122*  --  106* 103*  BUN 23  --  23 30*  CREATININE 0.92 0.86 1.00 0.88  CALCIUM 9.2  --  8.3* 8.4*  MG  --   --  1.9  --   PHOS  --   --  3.3  --    Liver Function Tests: No results for input(s): AST, ALT, ALKPHOS, BILITOT, PROT, ALBUMIN in the last 168 hours. No results for input(s): LIPASE, AMYLASE in the last 168 hours. No results for input(s): AMMONIA in the last 168 hours. CBC: Recent Labs  Lab 02/03/19 0942 02/03/19 1510 02/04/19 0401 02/05/19 0547  WBC 15.8* 21.6* 14.5* 9.4  NEUTROABS 14.4*  --   --  6.7  HGB 13.8 13.4 11.9* 11.5*  HCT 43.3 42.3 37.4 37.1  MCV 91.2 91.2 91.7 93.0  PLT 269 270 243 234   Cardiac Enzymes: Recent Labs  Lab 02/03/19 0942  CKTOTAL 327*   BNP: Invalid input(s): POCBNP CBG: Recent Labs  Lab 02/03/19 1438 02/04/19 1059   GLUCAP 113* 131*     IMAGING RESULTS:  Imaging: Ct Head Wo Contrast  Result Date: 02/03/2019 CLINICAL DATA:  Altered level of consciousness, fall EXAM: CT HEAD WITHOUT CONTRAST TECHNIQUE: Contiguous axial images were obtained from the base of the skull through the vertex without intravenous contrast. COMPARISON:  09/06/2013 FINDINGS: Brain: There is atrophy and chronic small vessel disease changes. No acute intracranial abnormality. Specifically, no hemorrhage, hydrocephalus, mass lesion, acute infarction, or significant intracranial injury. Vascular: No hyperdense vessel or unexpected calcification. Skull: No acute calvarial abnormality. Sinuses/Orbits: Visualized paranasal sinuses and mastoids clear. Orbital soft tissues unremarkable. Other: None IMPRESSION: Atrophy, chronic microvascular disease. No acute intracranial abnormality. Electronically Signed   By: Rolm Baptise M.D.   On: 02/03/2019 18:48   Dg Chest Port 1 View  Result Date: 02/04/2019 CLINICAL DATA:  Acute respiratory failure. EXAM: PORTABLE CHEST 1 VIEW COMPARISON:  02/03/2019 and earlier exams. FINDINGS: Vascular congestion and interstitial thickening is stable from the previous day's study. No lung consolidation. No convincing pleural effusion and no pneumothorax. Mild streaky basilar atelectasis. Cardiac silhouette normal in size. IMPRESSION: 1. Stable exam from the previous day's study. 2. Vascular congestion with interstitial prominence. No evidence of pneumonia. Electronically Signed   By: Lajean Manes M.D.   On: 02/04/2019 09:17   Dg Chest Portable 1 View  Result Date: 02/03/2019 CLINICAL DATA:  Shortness of breath EXAM: PORTABLE CHEST 1 VIEW COMPARISON:  10/13/2018 FINDINGS: The heart size and mediastinal contours are within normal limits. Calcific aortic. Mild pulmonary vascular congestion. Streaky bibasilar opacities. No pleural effusion or pneumothorax. IMPRESSION: 1. Mild pulmonary vascular congestion. 2. Streaky  bibasilar opacities, favor atelectasis. Electronically Signed   By: Davina Poke M.D.   On: 02/03/2019 10:34   @PROBHOSP @   ASSESSMENT AND PLAN    -Multidisciplinary rounds held today   Acute hypoxic respiratory failure secondary to mild vascular congestion and pneumonia vs. atelectasis Hx: Former smoker  Prn Bipap for dyspnea and/or hypoxic  Scheduled and prn bronchodilator therapy  40 mg iv lasix x1 dose  Echo pending   Acute Left sided hemiparesis  - s/p CTH no bleed, MRA/MRI in process   - neurology consultation in progress - appreciate input  Hypothyroidism  Continuous telemetry monitoring  Continue outpatient synthroid   Acute encephalopathy  resolved  GI/Nutrition  GI PROPHYLAXIS as indicated DIET-->TF's as tolerated Constipation protocol as indicated  ENDO - ICU hypoglycemic\Hyperglycemia protocol -check FSBS per protocol   ELECTROLYTES -follow labs as needed -replace as needed -pharmacy consultation   DVT/GI PRX ordered -SCDs  TRANSFUSIONS AS NEEDED MONITOR FSBS ASSESS the need for LABS as needed   Critical care provider statement:    Critical care time (minutes):  32   Critical care time was exclusive of:  Separately billable procedures and treating other patients   Critical care was necessary to treat or prevent imminent or life-threatening deterioration of the following conditions:   Acute hypoxemic respiratory failure, hypothyroidism, multiple comorbid conditions   Critical care was time spent personally by me on the following activities:  Development of treatment plan with patient or surrogate, discussions with consultants, evaluation of patient's response to treatment, examination of patient, obtaining history from patient or surrogate, ordering and performing treatments and interventions, ordering and review of laboratory studies and re-evaluation of patient's condition.  I assumed direction of critical care for this patient from another  provider in my specialty: no    This document was prepared using Dragon voice recognition software and may include unintentional dictation errors.    Ottie Glazier, M.D.  Division of [place]

## 2019-02-05 NOTE — Anesthesia Procedure Notes (Signed)
Procedure Name: Intubation Date/Time: 02/05/2019 5:46 PM Performed by: Suzy Bouchard, CRNA Pre-anesthesia Checklist: Patient identified, Patient being monitored, Emergency Drugs available, Suction available and Timeout performed Patient Re-evaluated:Patient Re-evaluated prior to induction Oxygen Delivery Method: Circle system utilized Preoxygenation: Pre-oxygenation with 100% oxygen Induction Type: IV induction and Rapid sequence Laryngoscope Size: Miller and 2 Grade View: Grade I Tube type: Oral Tube size: 7.0 mm Number of attempts: 1 Airway Equipment and Method: Stylet Placement Confirmation: ETT inserted through vocal cords under direct vision,  positive ETCO2 and breath sounds checked- equal and bilateral Secured at: 23 cm Tube secured with: Tape Dental Injury: Teeth and Oropharynx as per pre-operative assessment

## 2019-02-05 NOTE — Procedures (Signed)
S/P RT common carotid arteriogram RT CFA approach. S/balloon angioplasty x 3 of pre occlusive symptomatic rt mca m1 SEG TO IMPROVED CALIBER TO APPROX 70 %. s.Lowry Bala md

## 2019-02-05 NOTE — Discharge Summary (Signed)
DISCHARGE SUMMARY     Name: GRATIA FARAR MRN: [id number] DOB: Apr 28, 1934     LOS: 2   DISCHARGE SUMMARY      Viana Shay  is a 85 y.o. female with a known history of arthritis, lung nodules comes in because of fall and found to have hypoxia saturation 84% on 4 L when EMS got her.  Blood work showed COVID-19 test negative, has elevated white count, mild UTI, atelectasis in the chest x-ray.  Followed by Dr. Heidi Dach has history of lung nodules.  Patient states that she had a fall this morning feels very tired.  Did not hit her head.  Complains of chills, generalized weakness.  Since being in medical intensive care unit patient has been weaned off of noninvasive ventilation and has clinically improved, she has worked with physical therapy.  Yesterday physical therapy noted that she had left facial droop and left hemiparesis but upon our evaluation patient was able to move all 4 extremities and able to articulate without slurring of speech without any focal neurologic deficits noted.  This morning at approximately 8 AM patient was again noted to have moderate weakness of left upper and lower extremity 2/4.  Patient was subsequently evaluated by neurology with MRI/MRA/CT done stat showing acute CVA with thrombotic component.  Neurology evaluation/discourse with neuroradiology for possible penumbra/thrombectomy.  Neurologist had initiated transfer to [facility], patient will be brought to ER.     PAST MEDICAL HISTORY   Past Medical History:  Diagnosis Date  . Arthritis   . Carpal tunnel syndrome   . Complication of anesthesia    Difficulty waking up. Believes it took 2 days to wake up after her surgery.  . Depression   . Hyperlipidemia   . Hypertension   . Shortness of breath   . Stroke ([place])   . Thyroid disease       SURGICAL HISTORY   Past Surgical History:  Procedure Laterality Date  . APPENDECTOMY    . BACK SURGERY    . CHOLECYSTECTOMY    . HERNIA REPAIR       FAMILY HISTORY   Family History  Problem Relation Age of Onset  . Heart disease Mother   . Hypertension Mother   . Diverticulitis Mother   . Heart disease Father   . Hypertension Father      SOCIAL HISTORY   Social History   Tobacco Use  . Smoking status: Former Smoker    Types: Cigarettes    Quit date: 08/15/1992    Years since quitting: 26.4  . Smokeless tobacco: Never Used  Substance Use Topics  . Alcohol use: No  . Drug use: No     MEDICATIONS   Current Medication:  Current Facility-Administered Medications:  .  acetaminophen (TYLENOL) tablet 650 mg, 650 mg, Oral, Q6H PRN **OR** acetaminophen (TYLENOL) suppository 650 mg, 650 mg, Rectal, Q6H PRN, Epifanio Lesches, MD .  allopurinol (ZYLOPRIM) tablet 100 mg, 100 mg, Oral, Daily, Epifanio Lesches, MD, 100 mg at 02/05/19 0927 .  aspirin tablet 325 mg, 325 mg, Oral, Daily, Leslye Peer, Richard, MD, 325 mg at 02/05/19 1356 .  atorvastatin (LIPITOR) tablet 80 mg, 80 mg, Oral, q1800, Ottie Glazier, MD, 80 mg at 02/05/19 1356 .  azithromycin (ZITHROMAX) tablet 500 mg, 500 mg, Oral, q1800, Charlett Nose, RPH, 500 mg at 02/05/19 1620 .  bisacodyl (DULCOLAX) EC tablet 5 mg, 5 mg, Oral, Daily PRN, Epifanio Lesches, MD .  cefTRIAXone (ROCEPHIN) 1 g in sodium  chloride 0.9 % 100 mL IVPB, 1 g, Intravenous, Q24H, Epifanio Lesches, MD, Last Rate: 200 mL/hr at 02/05/19 1619, 1 g at 02/05/19 1619 .  chlorhexidine (PERIDEX) 0.12 % solution 15 mL, 15 mL, Mouth Rinse, BID, Epifanio Lesches, MD, 15 mL at 02/05/19 0927 .  Chlorhexidine Gluconate Cloth 2 % PADS 6 each, 6 each, Topical, Daily, Epifanio Lesches, MD, Stopped at 02/04/19 2042 .  docusate sodium (COLACE) capsule 100 mg, 100 mg, Oral, BID, Epifanio Lesches, MD, 100 mg at 02/05/19 0927 .   enoxaparin (LOVENOX) injection 40 mg, 40 mg, Subcutaneous, Q24H, Epifanio Lesches, MD, 40 mg at 02/04/19 2128 .  gabapentin (NEURONTIN) capsule 100 mg, 100 mg, Oral, BID, Epifanio Lesches, MD, 100 mg at 02/05/19 0926 .  HYDROcodone-acetaminophen (NORCO/VICODIN) 5-325 MG per tablet 1 tablet, 1 tablet, Oral, TID PRN, Epifanio Lesches, MD, 1 tablet at 02/04/19 2135 .  ipratropium-albuterol (DUONEB) 0.5-2.5 (3) MG/3ML nebulizer solution 3 mL, 3 mL, Nebulization, Q6H PRN, Awilda Bill, NP .  ipratropium-albuterol (DUONEB) 0.5-2.5 (3) MG/3ML nebulizer solution 3 mL, 3 mL, Nebulization, TID, Lanney Gins, Dyanara Cozza, MD, 3 mL at 02/05/19 1420 .  levothyroxine (SYNTHROID) tablet 100 mcg, 100 mcg, Oral, QAC breakfast, Epifanio Lesches, MD, 100 mcg at 02/05/19 0521 .  MEDLINE mouth rinse, 15 mL, Mouth Rinse, q12n4p, Epifanio Lesches, MD, 15 mL at 02/05/19 1614 .  ondansetron (ZOFRAN) tablet 4 mg, 4 mg, Oral, Q6H PRN **OR** ondansetron (ZOFRAN) injection 4 mg, 4 mg, Intravenous, Q6H PRN, Epifanio Lesches, MD .  pantoprazole (PROTONIX) EC tablet 40 mg, 40 mg, Oral, Daily, Epifanio Lesches, MD, 40 mg at 02/05/19 0926 .  traZODone (DESYREL) tablet 25 mg, 25 mg, Oral, QHS PRN, Epifanio Lesches, MD .  vitamin B-12 (CYANOCOBALAMIN) tablet 2,500 mcg, 2,500 mcg, Oral, Daily, Epifanio Lesches, MD, 2,500 mcg at 02/05/19 0926 .  vitamin E capsule 400 Units, 400 Units, Oral, Daily, Epifanio Lesches, MD, 400 Units at 02/05/19 [id number]    ALLERGIES   Patient has no known allergies.    REVIEW OF SYSTEMS     N point ROS conducted and is negative except as per HPI and subjective findings  PHYSICAL EXAMINATION   Vital Signs: Temp:  [97.5 F (36.4 C)-98.6 F (37 C)] 98.4 F (36.9 C) (09/26 1600) Pulse Rate:  [69-99] 93 (09/26 1600) Resp:  [15-30] 22 (09/26 1600) BP: (100-172)/(38-146) 172/61 (09/26 1600) SpO2:  [92 %-100 %] 92 % (09/26 1600) Weight:  [102.5 kg] 102.5 kg (09/26  0232)  GENERAL: No apparent distress HEAD: Normocephalic, atraumatic.  EYES: Pupils equal, round, reactive to light.  No scleral icterus.  MOUTH: Moist mucosal membrane. NECK: Supple. No thyromegaly. No nodules. No JVD.  PULMONARY: Mild bibasilar crackles CARDIOVASCULAR: S1 and S2. Regular rate and rhythm. No murmurs, rubs, or gallops.  GASTROINTESTINAL: Soft, nontender, non-distended. No masses. Positive bowel sounds. No hepatosplenomegaly.  MUSCULOSKELETAL: No swelling, clubbing, or edema.  NEUROLOGIC: Mild distress due to acute illness SKIN:intact,warm,dry   PERTINENT DATA     Infusions: . cefTRIAXone (ROCEPHIN)  IV 1 g (02/05/19 1619)   Scheduled Medications: . allopurinol  100 mg Oral Daily  . aspirin  325 mg Oral Daily  . atorvastatin  80 mg Oral q1800  . azithromycin  500 mg Oral q1800  . chlorhexidine  15 mL Mouth Rinse BID  . Chlorhexidine Gluconate Cloth  6 each Topical Daily  . docusate sodium  100 mg Oral BID  . enoxaparin (LOVENOX) injection  40 mg Subcutaneous Q24H  . gabapentin  100 mg  Oral BID  . ipratropium-albuterol  3 mL Nebulization TID  . levothyroxine  100 mcg Oral QAC breakfast  . mouth rinse  15 mL Mouth Rinse q12n4p  . pantoprazole  40 mg Oral Daily  . vitamin B-12  2,500 mcg Oral Daily  . vitamin E  400 Units Oral Daily   PRN Medications: acetaminophen **OR** acetaminophen, bisacodyl, HYDROcodone-acetaminophen, ipratropium-albuterol, ondansetron **OR** ondansetron (ZOFRAN) IV, traZODone Hemodynamic parameters:   Intake/Output: 09/25 0701 - 09/26 0700 In: 1399.1 [P.O.:480; IV Piggyback:919.1] Out: 750 [Urine:750]  Ventilator  Settings:      LAB RESULTS:  Basic Metabolic Panel: Recent Labs  Lab 02/03/19 0942 02/03/19 1510 02/04/19 0401 02/05/19 0547  NA 143  --  145 140  K 3.5  --  3.0* 3.3*  CL 101  --  107 104  CO2 28  --  26 26  GLUCOSE 122*  --  106* 103*  BUN 23  --  23 30*  CREATININE 0.92 0.86 1.00 0.88  CALCIUM 9.2   --  8.3* 8.4*  MG  --   --  1.9  --   PHOS  --   --  3.3  --    Liver Function Tests: No results for input(s): AST, ALT, ALKPHOS, BILITOT, PROT, ALBUMIN in the last 168 hours. No results for input(s): LIPASE, AMYLASE in the last 168 hours. No results for input(s): AMMONIA in the last 168 hours. CBC: Recent Labs  Lab 02/03/19 0942 02/03/19 1510 02/04/19 0401 02/05/19 0547  WBC 15.8* 21.6* 14.5* 9.4  NEUTROABS 14.4*  --   --  6.7  HGB 13.8 13.4 11.9* 11.5*  HCT 43.3 42.3 37.4 37.1  MCV 91.2 91.2 91.7 93.0  PLT 269 270 243 234   Cardiac Enzymes: Recent Labs  Lab 02/03/19 0942  CKTOTAL 327*   BNP: Invalid input(s): POCBNP CBG: Recent Labs  Lab 02/03/19 1438 02/04/19 1059 02/05/19 0744  GLUCAP 113* 131* 89     IMAGING RESULTS:  Imaging: Ct Angio Head W Or Wo Contrast  Result Date: 02/05/2019 CLINICAL DATA:  Stroke. Left-sided weakness. Right-sided watershed infarction by MRI. Small left temporal stroke. EXAM: CT ANGIOGRAPHY HEAD AND NECK CT PERFUSION BRAIN TECHNIQUE: Multidetector CT imaging of the head and neck was performed using the standard protocol during bolus administration of intravenous contrast. Multiplanar CT image reconstructions and MIPs were obtained to evaluate the vascular anatomy. Carotid stenosis measurements (when applicable) are obtained utilizing NASCET criteria, using the distal internal carotid diameter as the denominator. Multiphase CT imaging of the brain was performed following IV bolus contrast injection. Subsequent parametric perfusion maps were calculated using RAPID software. CONTRAST:  140mL OMNIPAQUE IOHEXOL 350 MG/ML SOLN COMPARISON:  MRI studies same day FINDINGS: CTA NECK FINDINGS Aortic arch: Aortic atherosclerosis. Branching pattern shows atherosclerotic disease. There is motion degradation. I do not suspect a flow limiting origin stenosis. Left vertebral arises directly from the arch. Right carotid system: Common carotid artery widely  patent to the bifurcation. Calcified plaque at the carotid bifurcation and proximal ICA bulb. Minimal diameter is 3 mm. Compared to a more distal cervical ICA diameter of 5 mm, this indicates a 40% stenosis. Left carotid system: Common carotid artery is widely patent to the bifurcation region. Calcified plaque at the carotid bifurcation. No stenosis. Vertebral arteries: Right vertebral artery is occluded at the origin. Left vertebral artery originates from the arch. The origin cannot be accurately evaluated. The left vertebral artery is patent through the cervical region to the foramen magnum. There is  reconstitution of the right vertebral artery at C1. Skeleton: Ordinary spondylosis. Other neck: No mass or lymphadenopathy. Upper chest: Negative Review of the MIP images confirms the above findings CTA HEAD FINDINGS Anterior circulation: Both internal carotid arteries are patent through the skull base and siphon regions. There is atherosclerotic calcification in both carotid siphon regions without stenosis greater than 30%. The right A1 segment appears widely patent. There is severe stenosis in the right M1 segment with near occlusion. Distal vessels do show good opacification. Both anterior cerebral arteries opacified normally, thanks to the presence of a patent anterior communicating artery. The left A1 segment is occluded. The left M1 segment is widely patent. Left MCA branch vessels show flow. Stenosis at the origin of the left temporal branch. Posterior circulation: Left vertebral artery is widely patent through the foramen magnum to the basilar. There is retrograde and or reconstituted flow in the distal right vertebral artery. Multiple stenoses are present in the right V4 segment. There is moderate stenosis of the proximal basilar artery, 50%. More distal basilar artery is widely patent. Superior cerebellar and posterior cerebral arteries are patent. There is moderate stenosis at the right P1 P2 junction.  Venous sinuses: Patent and normal. Anatomic variants: None other significant. Review of the MIP images confirms the above findings CT Brain Perfusion Findings: ASPECTS: 10 CBF (<30%) Volume: 20mL Perfusion (Tmax>6.0s) volume: 3 ccmL. There is a 110 cc region of T-max greater than 4 seconds in the right middle cerebral artery territory, secondary to the severe M1 stenosis. Mismatch Volume: 2mL Infarction Location:No infarction identified. At risk brain in the posterior parietal region on the right measuring 3 cc. If 1 considers the T-max greater than 4 second brain to be at risk, there is considerable at risk brain throughout the right MCA territory. IMPRESSION: CT angiography confirms the MR findings. Severe stenosis with near occlusion of the right M1 segment. Occlusion of the left A1 segment. Moderate stenosis of the right P1 P2 junction. 40% stenosis at the right carotid bifurcation. Right vertebral artery occluded at its origin. Reconstituted flow and retrograde flow in the distal right vertebral artery. 50% proximal basilar stenosis. No CBF less than 30% identified by this study. Possible reperfusion of the completed infarctions of the deep brain on the right shown by MRI. Small focus of T-max greater than 6 seconds in the right parietal region. 110 cc region of T-max greater than 4 seconds throughout the right middle cerebral artery distribution, indicating at risk brain secondary to the near occlusion of the right MCA. These results were called by telephone at the time of interpretation on 02/05/2019 at 3:47 pm to provider [facility] , who verbally acknowledged these results. Electronically Signed   By: Nelson Chimes M.D.   On: 02/05/2019 15:55   Ct Head Wo Contrast  Result Date: 02/05/2019 CLINICAL DATA:  Focal neuro deficit.  Left-sided weakness dysarthria EXAM: CT HEAD WITHOUT CONTRAST TECHNIQUE: Contiguous axial images were obtained from the base of the skull through the vertex without intravenous  contrast. COMPARISON:  CT head 02/03/2019 FINDINGS: Brain: Moderate atrophy and ventricular enlargement, stable. Patchy hypodensity in the white matter right greater than left, stable. Negative for hemorrhage or mass.  No midline shift. Vascular: Negative for hyperdense vessel Skull: Negative Sinuses/Orbits: Negative Other: None IMPRESSION: No change from yesterday Moderate atrophy. Microvascular ischemic changes are present a white matter right greater than left, stable from prior study. Electronically Signed   By: Franchot Gallo M.D.   On: 02/05/2019 08:59  Ct Head Wo Contrast  Result Date: 02/03/2019 CLINICAL DATA:  Altered level of consciousness, fall EXAM: CT HEAD WITHOUT CONTRAST TECHNIQUE: Contiguous axial images were obtained from the base of the skull through the vertex without intravenous contrast. COMPARISON:  09/06/2013 FINDINGS: Brain: There is atrophy and chronic small vessel disease changes. No acute intracranial abnormality. Specifically, no hemorrhage, hydrocephalus, mass lesion, acute infarction, or significant intracranial injury. Vascular: No hyperdense vessel or unexpected calcification. Skull: No acute calvarial abnormality. Sinuses/Orbits: Visualized paranasal sinuses and mastoids clear. Orbital soft tissues unremarkable. Other: None IMPRESSION: Atrophy, chronic microvascular disease. No acute intracranial abnormality. Electronically Signed   By: Rolm Baptise M.D.   On: 02/03/2019 18:48   Ct Angio Neck W Or Wo Contrast  Result Date: 02/05/2019 CLINICAL DATA:  Stroke. Left-sided weakness. Right-sided watershed infarction by MRI. Small left temporal stroke. EXAM: CT ANGIOGRAPHY HEAD AND NECK CT PERFUSION BRAIN TECHNIQUE: Multidetector CT imaging of the head and neck was performed using the standard protocol during bolus administration of intravenous contrast. Multiplanar CT image reconstructions and MIPs were obtained to evaluate the vascular anatomy. Carotid stenosis measurements (when  applicable) are obtained utilizing NASCET criteria, using the distal internal carotid diameter as the denominator. Multiphase CT imaging of the brain was performed following IV bolus contrast injection. Subsequent parametric perfusion maps were calculated using RAPID software. CONTRAST:  116mL OMNIPAQUE IOHEXOL 350 MG/ML SOLN COMPARISON:  MRI studies same day FINDINGS: CTA NECK FINDINGS Aortic arch: Aortic atherosclerosis. Branching pattern shows atherosclerotic disease. There is motion degradation. I do not suspect a flow limiting origin stenosis. Left vertebral arises directly from the arch. Right carotid system: Common carotid artery widely patent to the bifurcation. Calcified plaque at the carotid bifurcation and proximal ICA bulb. Minimal diameter is 3 mm. Compared to a more distal cervical ICA diameter of 5 mm, this indicates a 40% stenosis. Left carotid system: Common carotid artery is widely patent to the bifurcation region. Calcified plaque at the carotid bifurcation. No stenosis. Vertebral arteries: Right vertebral artery is occluded at the origin. Left vertebral artery originates from the arch. The origin cannot be accurately evaluated. The left vertebral artery is patent through the cervical region to the foramen magnum. There is reconstitution of the right vertebral artery at C1. Skeleton: Ordinary spondylosis. Other neck: No mass or lymphadenopathy. Upper chest: Negative Review of the MIP images confirms the above findings CTA HEAD FINDINGS Anterior circulation: Both internal carotid arteries are patent through the skull base and siphon regions. There is atherosclerotic calcification in both carotid siphon regions without stenosis greater than 30%. The right A1 segment appears widely patent. There is severe stenosis in the right M1 segment with near occlusion. Distal vessels do show good opacification. Both anterior cerebral arteries opacified normally, thanks to the presence of a patent anterior  communicating artery. The left A1 segment is occluded. The left M1 segment is widely patent. Left MCA branch vessels show flow. Stenosis at the origin of the left temporal branch. Posterior circulation: Left vertebral artery is widely patent through the foramen magnum to the basilar. There is retrograde and or reconstituted flow in the distal right vertebral artery. Multiple stenoses are present in the right V4 segment. There is moderate stenosis of the proximal basilar artery, 50%. More distal basilar artery is widely patent. Superior cerebellar and posterior cerebral arteries are patent. There is moderate stenosis at the right P1 P2 junction. Venous sinuses: Patent and normal. Anatomic variants: None other significant. Review of the MIP images confirms the above  findings CT Brain Perfusion Findings: ASPECTS: 10 CBF (<30%) Volume: 46mL Perfusion (Tmax>6.0s) volume: 3 ccmL. There is a 110 cc region of T-max greater than 4 seconds in the right middle cerebral artery territory, secondary to the severe M1 stenosis. Mismatch Volume: 60mL Infarction Location:No infarction identified. At risk brain in the posterior parietal region on the right measuring 3 cc. If 1 considers the T-max greater than 4 second brain to be at risk, there is considerable at risk brain throughout the right MCA territory. IMPRESSION: CT angiography confirms the MR findings. Severe stenosis with near occlusion of the right M1 segment. Occlusion of the left A1 segment. Moderate stenosis of the right P1 P2 junction. 40% stenosis at the right carotid bifurcation. Right vertebral artery occluded at its origin. Reconstituted flow and retrograde flow in the distal right vertebral artery. 50% proximal basilar stenosis. No CBF less than 30% identified by this study. Possible reperfusion of the completed infarctions of the deep brain on the right shown by MRI. Small focus of T-max greater than 6 seconds in the right parietal region. 110 cc region of T-max  greater than 4 seconds throughout the right middle cerebral artery distribution, indicating at risk brain secondary to the near occlusion of the right MCA. These results were called by telephone at the time of interpretation on 02/05/2019 at 3:47 pm to provider [facility] , who verbally acknowledged these results. Electronically Signed   By: Nelson Chimes M.D.   On: 02/05/2019 15:55   Mr Angio Head Wo Contrast  Result Date: 02/05/2019 CLINICAL DATA:  Stroke.  Left-sided weakness. EXAM: MRI HEAD WITHOUT  CONTRAST MRA HEAD WITHOUT CONTRAST MRA NECK WITHOUT AND WITH CONTRAST TECHNIQUE: Multiplanar, multiecho pulse sequences of the brain and surrounding structures were obtained without intravenous contrast. Angiographic images of the Circle of Willis were obtained using MRA technique without intravenous contrast. Angiographic images of the neck were obtained using MRA technique without and with intravenous contrast. Carotid stenosis measurements (when applicable) are obtained utilizing NASCET criteria, using the distal internal carotid diameter as the denominator. CONTRAST:  66mL GADAVIST GADOBUTROL 1 MMOL/ML IV SOLN COMPARISON:  CT head 02/05/2019.  MRI 09/07/2013 FINDINGS: MRI HEAD FINDINGS Brain: Acute infarct in the deep white matter on the right with scattered small areas of acute infarct. Additional small areas of acute infarct in the left parietal periventricular white matter and left parietal cortex. Small acute infarct left lateral posterior temporal lobe. Generalized atrophy with ventricular enlargement similar to 2015. Chronic microvascular ischemic changes in the white matter. Small chronic infarct right cerebellum. Retro cerebellar cyst unchanged. Negative for hemorrhage or mass. Vascular: Normal arterial flow voids. Skull and upper cervical spine: Negative Sinuses/Orbits: Negative Other: None MRA HEAD FINDINGS Left vertebral artery supplies the basilar and is widely patent. Distal right vertebral  artery occluded. Mild stenosis proximal basilar. Main basilar is patent. Moderate to severe stenosis right P1 segment. Severe stenosis right P2 segment. Moderate stenosis left P1 segment. Mild atherosclerotic irregularity and mild stenosis in the internal carotid artery bilaterally. Left A1 segment occluded. Both anterior cerebral arteries patent and supplied from the right Severe stenosis right M1 segment with distal flow present. Mild to moderate stenosis proximal left M1. Left middle cerebral artery branches widely patent. Negative for cerebral aneurysm. MRA NECK FINDINGS Normal aortic arch.  Proximal great vessels patent. Irregularity of the proximal right common carotid artery most likely artifact from breathing motion although possibly atherosclerotic stenosis. Carotid bifurcation widely patent bilaterally. Mild to moderate stenosis left internal  carotid artery a at the petrous segment. Left vertebral artery widely patent to the basilar without stenosis Occlusion of the proximal and mid right vertebral artery with reconstitution distally. Severely disease distal right vertebral artery which may have minimal contribution to the basilar. IMPRESSION: 1. Acute infarct deep white matter on the right in a watershed territory, likely due to severe stenosis right M1 segment. 2. Small acute infarct left lateral posterior temporal lobe 3. Carotid bifurcation widely patent in the neck. Mild to moderate stenosis of the left petrous carotid. 4. Left vertebral artery widely patent. Right vertebral artery severely diseased with long segment occlusion and partial reconstitution distally. 5. Moderate to severe stenosis right P1. Severe stenosis right P2 segment. Moderate stenosis left P1 segment. 6. Occluded left A1 segment. Severe stenosis right M1 segment. Mild to moderate stenosis left M1. Electronically Signed   By: Franchot Gallo M.D.   On: 02/05/2019 13:06   Mr Angio Neck W Wo Contrast  Result Date:  02/05/2019 CLINICAL DATA:  Stroke.  Left-sided weakness. EXAM: MRI HEAD WITHOUT  CONTRAST MRA HEAD WITHOUT CONTRAST MRA NECK WITHOUT AND WITH CONTRAST TECHNIQUE: Multiplanar, multiecho pulse sequences of the brain and surrounding structures were obtained without intravenous contrast. Angiographic images of the Circle of Willis were obtained using MRA technique without intravenous contrast. Angiographic images of the neck were obtained using MRA technique without and with intravenous contrast. Carotid stenosis measurements (when applicable) are obtained utilizing NASCET criteria, using the distal internal carotid diameter as the denominator. CONTRAST:  84mL GADAVIST GADOBUTROL 1 MMOL/ML IV SOLN COMPARISON:  CT head 02/05/2019.  MRI 09/07/2013 FINDINGS: MRI HEAD FINDINGS Brain: Acute infarct in the deep white matter on the right with scattered small areas of acute infarct. Additional small areas of acute infarct in the left parietal periventricular white matter and left parietal cortex. Small acute infarct left lateral posterior temporal lobe. Generalized atrophy with ventricular enlargement similar to 2015. Chronic microvascular ischemic changes in the white matter. Small chronic infarct right cerebellum. Retro cerebellar cyst unchanged. Negative for hemorrhage or mass. Vascular: Normal arterial flow voids. Skull and upper cervical spine: Negative Sinuses/Orbits: Negative Other: None MRA HEAD FINDINGS Left vertebral artery supplies the basilar and is widely patent. Distal right vertebral artery occluded. Mild stenosis proximal basilar. Main basilar is patent. Moderate to severe stenosis right P1 segment. Severe stenosis right P2 segment. Moderate stenosis left P1 segment. Mild atherosclerotic irregularity and mild stenosis in the internal carotid artery bilaterally. Left A1 segment occluded. Both anterior cerebral arteries patent and supplied from the right Severe stenosis right M1 segment with distal flow present.  Mild to moderate stenosis proximal left M1. Left middle cerebral artery branches widely patent. Negative for cerebral aneurysm. MRA NECK FINDINGS Normal aortic arch.  Proximal great vessels patent. Irregularity of the proximal right common carotid artery most likely artifact from breathing motion although possibly atherosclerotic stenosis. Carotid bifurcation widely patent bilaterally. Mild to moderate stenosis left internal carotid artery a at the petrous segment. Left vertebral artery widely patent to the basilar without stenosis Occlusion of the proximal and mid right vertebral artery with reconstitution distally. Severely disease distal right vertebral artery which may have minimal contribution to the basilar. IMPRESSION: 1. Acute infarct deep white matter on the right in a watershed territory, likely due to severe stenosis right M1 segment. 2. Small acute infarct left lateral posterior temporal lobe 3. Carotid bifurcation widely patent in the neck. Mild to moderate stenosis of the left petrous carotid. 4. Left vertebral artery widely  patent. Right vertebral artery severely diseased with long segment occlusion and partial reconstitution distally. 5. Moderate to severe stenosis right P1. Severe stenosis right P2 segment. Moderate stenosis left P1 segment. 6. Occluded left A1 segment. Severe stenosis right M1 segment. Mild to moderate stenosis left M1. Electronically Signed   By: Franchot Gallo M.D.   On: 02/05/2019 13:06   Mr Brain Wo Contrast  Result Date: 02/05/2019 CLINICAL DATA:  Stroke.  Left-sided weakness. EXAM: MRI HEAD WITHOUT  CONTRAST MRA HEAD WITHOUT CONTRAST MRA NECK WITHOUT AND WITH CONTRAST TECHNIQUE: Multiplanar, multiecho pulse sequences of the brain and surrounding structures were obtained without intravenous contrast. Angiographic images of the Circle of Willis were obtained using MRA technique without intravenous contrast. Angiographic images of the neck were obtained using MRA technique  without and with intravenous contrast. Carotid stenosis measurements (when applicable) are obtained utilizing NASCET criteria, using the distal internal carotid diameter as the denominator. CONTRAST:  38mL GADAVIST GADOBUTROL 1 MMOL/ML IV SOLN COMPARISON:  CT head 02/05/2019.  MRI 09/07/2013 FINDINGS: MRI HEAD FINDINGS Brain: Acute infarct in the deep white matter on the right with scattered small areas of acute infarct. Additional small areas of acute infarct in the left parietal periventricular white matter and left parietal cortex. Small acute infarct left lateral posterior temporal lobe. Generalized atrophy with ventricular enlargement similar to 2015. Chronic microvascular ischemic changes in the white matter. Small chronic infarct right cerebellum. Retro cerebellar cyst unchanged. Negative for hemorrhage or mass. Vascular: Normal arterial flow voids. Skull and upper cervical spine: Negative Sinuses/Orbits: Negative Other: None MRA HEAD FINDINGS Left vertebral artery supplies the basilar and is widely patent. Distal right vertebral artery occluded. Mild stenosis proximal basilar. Main basilar is patent. Moderate to severe stenosis right P1 segment. Severe stenosis right P2 segment. Moderate stenosis left P1 segment. Mild atherosclerotic irregularity and mild stenosis in the internal carotid artery bilaterally. Left A1 segment occluded. Both anterior cerebral arteries patent and supplied from the right Severe stenosis right M1 segment with distal flow present. Mild to moderate stenosis proximal left M1. Left middle cerebral artery branches widely patent. Negative for cerebral aneurysm. MRA NECK FINDINGS Normal aortic arch.  Proximal great vessels patent. Irregularity of the proximal right common carotid artery most likely artifact from breathing motion although possibly atherosclerotic stenosis. Carotid bifurcation widely patent bilaterally. Mild to moderate stenosis left internal carotid artery a at the petrous  segment. Left vertebral artery widely patent to the basilar without stenosis Occlusion of the proximal and mid right vertebral artery with reconstitution distally. Severely disease distal right vertebral artery which may have minimal contribution to the basilar. IMPRESSION: 1. Acute infarct deep white matter on the right in a watershed territory, likely due to severe stenosis right M1 segment. 2. Small acute infarct left lateral posterior temporal lobe 3. Carotid bifurcation widely patent in the neck. Mild to moderate stenosis of the left petrous carotid. 4. Left vertebral artery widely patent. Right vertebral artery severely diseased with long segment occlusion and partial reconstitution distally. 5. Moderate to severe stenosis right P1. Severe stenosis right P2 segment. Moderate stenosis left P1 segment. 6. Occluded left A1 segment. Severe stenosis right M1 segment. Mild to moderate stenosis left M1. Electronically Signed   By: Franchot Gallo M.D.   On: 02/05/2019 13:06   Ct Cerebral Perfusion W Contrast  Result Date: 02/05/2019 CLINICAL DATA:  Stroke. Left-sided weakness. Right-sided watershed infarction by MRI. Small left temporal stroke. EXAM: CT ANGIOGRAPHY HEAD AND NECK CT PERFUSION BRAIN TECHNIQUE: Multidetector  CT imaging of the head and neck was performed using the standard protocol during bolus administration of intravenous contrast. Multiplanar CT image reconstructions and MIPs were obtained to evaluate the vascular anatomy. Carotid stenosis measurements (when applicable) are obtained utilizing NASCET criteria, using the distal internal carotid diameter as the denominator. Multiphase CT imaging of the brain was performed following IV bolus contrast injection. Subsequent parametric perfusion maps were calculated using RAPID software. CONTRAST:  151mL OMNIPAQUE IOHEXOL 350 MG/ML SOLN COMPARISON:  MRI studies same day FINDINGS: CTA NECK FINDINGS Aortic arch: Aortic atherosclerosis. Branching pattern  shows atherosclerotic disease. There is motion degradation. I do not suspect a flow limiting origin stenosis. Left vertebral arises directly from the arch. Right carotid system: Common carotid artery widely patent to the bifurcation. Calcified plaque at the carotid bifurcation and proximal ICA bulb. Minimal diameter is 3 mm. Compared to a more distal cervical ICA diameter of 5 mm, this indicates a 40% stenosis. Left carotid system: Common carotid artery is widely patent to the bifurcation region. Calcified plaque at the carotid bifurcation. No stenosis. Vertebral arteries: Right vertebral artery is occluded at the origin. Left vertebral artery originates from the arch. The origin cannot be accurately evaluated. The left vertebral artery is patent through the cervical region to the foramen magnum. There is reconstitution of the right vertebral artery at C1. Skeleton: Ordinary spondylosis. Other neck: No mass or lymphadenopathy. Upper chest: Negative Review of the MIP images confirms the above findings CTA HEAD FINDINGS Anterior circulation: Both internal carotid arteries are patent through the skull base and siphon regions. There is atherosclerotic calcification in both carotid siphon regions without stenosis greater than 30%. The right A1 segment appears widely patent. There is severe stenosis in the right M1 segment with near occlusion. Distal vessels do show good opacification. Both anterior cerebral arteries opacified normally, thanks to the presence of a patent anterior communicating artery. The left A1 segment is occluded. The left M1 segment is widely patent. Left MCA branch vessels show flow. Stenosis at the origin of the left temporal branch. Posterior circulation: Left vertebral artery is widely patent through the foramen magnum to the basilar. There is retrograde and or reconstituted flow in the distal right vertebral artery. Multiple stenoses are present in the right V4 segment. There is moderate stenosis  of the proximal basilar artery, 50%. More distal basilar artery is widely patent. Superior cerebellar and posterior cerebral arteries are patent. There is moderate stenosis at the right P1 P2 junction. Venous sinuses: Patent and normal. Anatomic variants: None other significant. Review of the MIP images confirms the above findings CT Brain Perfusion Findings: ASPECTS: 10 CBF (<30%) Volume: 77mL Perfusion (Tmax>6.0s) volume: 3 ccmL. There is a 110 cc region of T-max greater than 4 seconds in the right middle cerebral artery territory, secondary to the severe M1 stenosis. Mismatch Volume: 74mL Infarction Location:No infarction identified. At risk brain in the posterior parietal region on the right measuring 3 cc. If 1 considers the T-max greater than 4 second brain to be at risk, there is considerable at risk brain throughout the right MCA territory. IMPRESSION: CT angiography confirms the MR findings. Severe stenosis with near occlusion of the right M1 segment. Occlusion of the left A1 segment. Moderate stenosis of the right P1 P2 junction. 40% stenosis at the right carotid bifurcation. Right vertebral artery occluded at its origin. Reconstituted flow and retrograde flow in the distal right vertebral artery. 50% proximal basilar stenosis. No CBF less than 30% identified by this study.  Possible reperfusion of the completed infarctions of the deep brain on the right shown by MRI. Small focus of T-max greater than 6 seconds in the right parietal region. 110 cc region of T-max greater than 4 seconds throughout the right middle cerebral artery distribution, indicating at risk brain secondary to the near occlusion of the right MCA. These results were called by telephone at the time of interpretation on 02/05/2019 at 3:47 pm to provider [facility] , who verbally acknowledged these results. Electronically Signed   By: Nelson Chimes M.D.   On: 02/05/2019 15:55   Dg Chest Port 1 View  Result Date: 02/04/2019 CLINICAL  DATA:  Acute respiratory failure. EXAM: PORTABLE CHEST 1 VIEW COMPARISON:  02/03/2019 and earlier exams. FINDINGS: Vascular congestion and interstitial thickening is stable from the previous day's study. No lung consolidation. No convincing pleural effusion and no pneumothorax. Mild streaky basilar atelectasis. Cardiac silhouette normal in size. IMPRESSION: 1. Stable exam from the previous day's study. 2. Vascular congestion with interstitial prominence. No evidence of pneumonia. Electronically Signed   By: Lajean Manes M.D.   On: 02/04/2019 09:17    ASSESSMENT AND PLAN    -Multidisciplinary rounds held today   Acute hypoxic respiratory failure secondary to mild vascular congestion and pneumonia vs. atelectasis Hx: Former smoker  Prn Bipap for dyspnea and/or hypoxic  Scheduled and prn bronchodilator therapy  40 mg iv lasix x1 dose  Echo pending     Acute Left sided hemiparesis  - s/p CTH no bleed, MRA/MRI -acute cva  - neurology consultation in progress - appreciate input CTA -Severe stenosis with near occlusion of the right M1 segment. Occlusion of the left A1 segment. Moderate stenosis of the right P1 P2 junction   Hypothyroidism  Continuous telemetry monitoring  Continue outpatient synthroid   Acute encephalopathy  resolved  GI/Nutrition GI PROPHYLAXIS as indicated DIET-->TF's as tolerated Constipation protocol as indicated  ENDO - ICU hypoglycemic\Hyperglycemia protocol -check FSBS per protocol   ELECTROLYTES -follow labs as needed -replace as needed -pharmacy consultation   DVT/GI PRX ordered -SCDs  TRANSFUSIONS AS NEEDED MONITOR FSBS ASSESS the need for LABS as needed   Critical care provider statement:    Critical care time (minutes):  32   Critical care time was exclusive of:  Separately billable procedures and treating other patients   Critical care was necessary to treat or prevent imminent or life-threatening deterioration of the following  conditions:   Acute hypoxemic respiratory failure, hypothyroidism, multiple comorbid conditions   Critical care was time spent personally by me on the following activities:  Development of treatment plan with patient or surrogate, discussions with consultants, evaluation of patient's response to treatment, examination of patient, obtaining history from patient or surrogate, ordering and performing treatments and interventions, ordering and review of laboratory studies and re-evaluation of patient's condition.  I assumed direction of critical care for this patient from another provider in my specialty: no    This document was prepared using Dragon voice recognition software and may include unintentional dictation errors.    Ottie Glazier, M.D.  Division of [facility]

## 2019-02-05 NOTE — Progress Notes (Signed)
Patient ID: Christy Carter, female   DOB: [birth year], 85 y.o.   MRN: [id number] INR . Post procedure. CT brain neg for ICH or mass effect. Patient extubated. Moving  RT side spontaneously and to command . NO sig movement  In the LT arm and leg. RT groin hemostasis achieved with manual compression. Distal pulses palpable LT DP and LKT PT. Dopplerable RT DP and PT.  S.Kamaal Cast MD

## 2019-02-05 NOTE — Progress Notes (Signed)
Patient ID: Christy Carter, female   DOB: August 16, 1933, 85 y.o.   MRN: [id number]  [facility] PROGRESS NOTE  MARLEENA MCKEEL [id number] DOB: 09-01-1933 DOA: 02/03/2019 PCP: Lavera Guise, MD  HPI/Subjective: Patient had some left arm weakness.  Patient was admitted with acute hypoxic respiratory failure.  Daughter states that she could not feed herself last night and was having problems with vision and strength.  Objective: Vitals:   02/05/19 1055 02/05/19 1205  BP:  (!) 134/44  Pulse: 91 88  Resp: (!) 27 (!) 22  Temp:  (!) 97.5 F (36.4 C)  SpO2: 96% 94%    Intake/Output Summary (Last 24 hours) at 02/05/2019 1302 Last data filed at 02/05/2019 1000 Gross per 24 hour  Intake 900.29 ml  Output 460 ml  Net 440.29 ml   Filed Weights   02/03/19 1439 02/04/19 0500 02/05/19 0232  Weight: 104.3 kg 103.4 kg 102.5 kg    ROS: Review of Systems  Constitutional: Negative for chills and fever.  Eyes: Negative for blurred vision.  Respiratory: Positive for shortness of breath. Negative for cough.   Cardiovascular: Negative for chest pain.  Gastrointestinal: Negative for abdominal pain, constipation, diarrhea, nausea and vomiting.  Genitourinary: Negative for dysuria.  Musculoskeletal: Negative for joint pain.  Neurological: Negative for dizziness and headaches.   Exam: Physical Exam  HENT:  Nose: No mucosal edema.  Mouth/Throat: No oropharyngeal exudate or posterior oropharyngeal edema.  Eyes: Pupils are equal, round, and reactive to light. Conjunctivae, EOM and lids are normal.  Neck: No JVD present. Carotid bruit is not present. No edema present. No thyroid mass and no thyromegaly present.  Cardiovascular: S1 normal and S2 normal. Exam reveals no gallop.  No murmur heard. Pulses:      Dorsalis pedis pulses are 2+ on the right side and 2+ on the left side.  Respiratory: No respiratory distress. She has decreased breath sounds in the right lower field and the left lower field.  She has no wheezes. She has no rhonchi. She has no rales.  GI: Soft. Bowel sounds are normal. There is no abdominal tenderness.  Musculoskeletal:     Right ankle: She exhibits no swelling.     Left ankle: She exhibits no swelling.  Lymphadenopathy:    She has no cervical adenopathy.  Neurological: She is alert. No cranial nerve deficit.  Left arm weakness with lifting her arm up.  Skin: Skin is warm. No rash noted. Nails show no clubbing.  Psychiatric: She has a normal mood and affect.      Data Reviewed: Basic Metabolic Panel: Recent Labs  Lab 02/03/19 0942 02/03/19 1510 02/04/19 0401 02/05/19 0547  NA 143  --  145 140  K 3.5  --  3.0* 3.3*  CL 101  --  107 104  CO2 28  --  26 26  GLUCOSE 122*  --  106* 103*  BUN 23  --  23 30*  CREATININE 0.92 0.86 1.00 0.88  CALCIUM 9.2  --  8.3* 8.4*  MG  --   --  1.9  --   PHOS  --   --  3.3  --    CBC: Recent Labs  Lab 02/03/19 0942 02/03/19 1510 02/04/19 0401 02/05/19 0547  WBC 15.8* 21.6* 14.5* 9.4  NEUTROABS 14.4*  --   --  6.7  HGB 13.8 13.4 11.9* 11.5*  HCT 43.3 42.3 37.4 37.1  MCV 91.2 91.2 91.7 93.0  PLT 269 270 243 234  Cardiac Enzymes: Recent Labs  Lab 02/03/19 0942  CKTOTAL 327*   BNP (last 3 results) Recent Labs    02/11/18 2006 02/03/19 0942 02/04/19 0401  BNP 118.0* 314.0* 304.0*     CBG: Recent Labs  Lab 02/03/19 1438 02/04/19 1059 02/05/19 0744  GLUCAP 113* 131* 89    Recent Results (from the past 240 hour(s))  CULTURE, URINE COMPREHENSIVE     Status: None (Preliminary result)   Collection Time: 02/02/19 10:39 AM   Specimen: Urine   URINE  Result Value Ref Range Status   Urine Culture, Comprehensive Preliminary report  Preliminary   Organism ID, Bacteria Comment  Preliminary    Comment: More than 3 organisms recovered, none predominant. Please submit another culture if clinically indicated. 50,000-100,000 colony forming units per mL   Blood culture (routine x 2)     Status:  None (Preliminary result)   Collection Time: 02/03/19  9:43 AM   Specimen: BLOOD  Result Value Ref Range Status   Specimen Description BLOOD RIGHT ANTECUBITAL  Final   Special Requests Blood Culture adequate volume  Final   Culture   Final    NO GROWTH 2 DAYS Performed at [facility], [address], [place], [place] [address]    Report Status PENDING  Incomplete  SARS Coronavirus 2 [facility] order, Performed in [facility] hospital lab) Nasopharyngeal Nasopharyngeal Swab     Status: None   Collection Time: 02/03/19  9:52 AM   Specimen: Nasopharyngeal Swab  Result Value Ref Range Status   SARS Coronavirus 2 NEGATIVE NEGATIVE Final    Comment: (NOTE) If result is NEGATIVE SARS-CoV-2 target nucleic acids are NOT DETECTED. The SARS-CoV-2 RNA is generally detectable in upper and lower  respiratory specimens during the acute phase of infection. The lowest  concentration of SARS-CoV-2 viral copies this assay can detect is 250  copies / mL. A negative result does not preclude SARS-CoV-2 infection  and should not be used as the sole basis for treatment or other  patient management decisions.  A negative result may occur with  improper specimen collection / handling, submission of specimen other  than nasopharyngeal swab, presence of viral mutation(s) within the  areas targeted by this assay, and inadequate number of viral copies  (<250 copies / mL). A negative result must be combined with clinical  observations, patient history, and epidemiological information. If result is POSITIVE SARS-CoV-2 target nucleic acids are DETECTED. The SARS-CoV-2 RNA is generally detectable in upper and lower  respiratory specimens dur ing the acute phase of infection.  Positive  results are indicative of active infection with SARS-CoV-2.  Clinical  correlation with patient history and other diagnostic information is  necessary to determine patient infection status.  Positive results do  not rule  out bacterial infection or co-infection with other viruses. If result is PRESUMPTIVE POSTIVE SARS-CoV-2 nucleic acids MAY BE PRESENT.   A presumptive positive result was obtained on the submitted specimen  and confirmed on repeat testing.  While 2019 novel coronavirus  (SARS-CoV-2) nucleic acids may be present in the submitted sample  additional confirmatory testing may be necessary for epidemiological  and / or clinical management purposes  to differentiate between  SARS-CoV-2 and other Sarbecovirus currently known to infect humans.  If clinically indicated additional testing with an alternate test  methodology [phone]) is advised. The SARS-CoV-2 RNA is generally  detectable in upper and lower respiratory sp ecimens during the acute  phase of infection. The expected result is Negative.  Fact Sheet for Patients:  StrictlyIdeas.no Fact Sheet for Healthcare Providers: BankingDealers.co.za This test is not yet approved or cleared by the [place] FDA and has been authorized for detection and/or diagnosis of SARS-CoV-2 by FDA under an Emergency Use Authorization (EUA).  This EUA will remain in effect (meaning this test can be used) for the duration of the COVID-19 declaration under Section 564(b)(1) of the Act, 21 U.S.C. section 360bbb-3(b)(1), unless the authorization is terminated or revoked sooner. Performed at [facility], [address] [place], [place] [address]   Blood culture (routine x 2)     Status: None (Preliminary result)   Collection Time: 02/03/19  9:52 AM   Specimen: BLOOD  Result Value Ref Range Status   Specimen Description BLOOD BLOOD LEFT HAND  Final   Special Requests   Final    BOTTLES DRAWN AEROBIC AND ANAEROBIC Blood Culture results may not be optimal due to an inadequate volume of blood received in culture bottles   Culture   Final    NO GROWTH 2 DAYS Performed at [facility], [address] [place], [place] [address]    Report Status PENDING  Incomplete  Urine Culture     Status: Abnormal   Collection Time: 02/03/19 11:22 AM   Specimen: Urine, Random  Result Value Ref Range Status   Specimen Description   Final    URINE, RANDOM Performed at [facility], [address], [place], [place] [address]    Special Requests   Final    NONE Performed at [facility], [address], [place], [place] [address]    Culture MULTIPLE SPECIES PRESENT, SUGGEST RECOLLECTION (A)  Final   Report Status 02/04/2019 FINAL  Final  MRSA PCR Screening     Status: None   Collection Time: 02/03/19  2:51 PM   Specimen: Nasopharyngeal  Result Value Ref Range Status   MRSA by PCR NEGATIVE NEGATIVE Final    Comment:        The GeneXpert MRSA Assay (FDA approved for NASAL specimens only), is one component of a comprehensive MRSA colonization surveillance program. It is not intended to diagnose MRSA infection nor to guide or monitor treatment for MRSA infections. Performed at [facility], [address] [place], [place] [address]      Studies: Ct Head Wo Contrast  Result Date: 02/05/2019 CLINICAL DATA:  Focal neuro deficit.  Left-sided weakness dysarthria EXAM: CT HEAD WITHOUT CONTRAST TECHNIQUE: Contiguous axial images were obtained from the base of the skull through the vertex without intravenous contrast. COMPARISON:  CT head 02/03/2019 FINDINGS: Brain: Moderate atrophy and ventricular enlargement, stable. Patchy hypodensity in the white matter right greater than left, stable. Negative for hemorrhage or mass.  No midline shift. Vascular: Negative for hyperdense vessel Skull: Negative Sinuses/Orbits: Negative Other: None IMPRESSION: No change from yesterday Moderate atrophy. Microvascular ischemic changes are present a white matter right greater than left, stable from prior study. Electronically Signed   By: Franchot Gallo M.D.   On: 02/05/2019 08:59    Ct Head Wo Contrast  Result Date: 02/03/2019 CLINICAL DATA:  Altered level of consciousness, fall EXAM: CT HEAD WITHOUT CONTRAST TECHNIQUE: Contiguous axial images were obtained from the base of the skull through the vertex without intravenous contrast. COMPARISON:  09/06/2013 FINDINGS: Brain: There is atrophy and chronic small vessel disease changes. No acute intracranial abnormality. Specifically, no hemorrhage, hydrocephalus, mass lesion, acute infarction, or significant intracranial injury. Vascular: No hyperdense vessel or unexpected  calcification. Skull: No acute calvarial abnormality. Sinuses/Orbits: Visualized paranasal sinuses and mastoids clear. Orbital soft tissues unremarkable. Other: None IMPRESSION: Atrophy, chronic microvascular disease. No acute intracranial abnormality. Electronically Signed   By: Rolm Baptise M.D.   On: 02/03/2019 18:48   Dg Chest Port 1 View  Result Date: 02/04/2019 CLINICAL DATA:  Acute respiratory failure. EXAM: PORTABLE CHEST 1 VIEW COMPARISON:  02/03/2019 and earlier exams. FINDINGS: Vascular congestion and interstitial thickening is stable from the previous day's study. No lung consolidation. No convincing pleural effusion and no pneumothorax. Mild streaky basilar atelectasis. Cardiac silhouette normal in size. IMPRESSION: 1. Stable exam from the previous day's study. 2. Vascular congestion with interstitial prominence. No evidence of pneumonia. Electronically Signed   By: Lajean Manes M.D.   On: 02/04/2019 09:17    Scheduled Meds: . allopurinol  100 mg Oral Daily  . azithromycin  500 mg Oral q1800  . celecoxib  200 mg Oral Daily  . chlorhexidine  15 mL Mouth Rinse BID  . Chlorhexidine Gluconate Cloth  6 each Topical Daily  . docusate sodium  100 mg Oral BID  . enoxaparin (LOVENOX) injection  40 mg Subcutaneous Q24H  . gabapentin  100 mg Oral BID  . ipratropium-albuterol  3 mL Nebulization TID  . levothyroxine  100 mcg Oral QAC breakfast  . mouth  rinse  15 mL Mouth Rinse q12n4p  . pantoprazole  40 mg Oral Daily  . vitamin B-12  2,500 mcg Oral Daily  . vitamin E  400 Units Oral Daily   Continuous Infusions: . cefTRIAXone (ROCEPHIN)  IV 200 mL/hr at 02/04/19 2000    Assessment/Plan:  1. Acute thrombotic stroke with left arm weakness.  Critical care specialist put in for neurology consultation.  Initial CT scan negative.  MRI confirming stroke in watershed area.  Recent echocardiogram did not see any clot in the heart.  Start aspirin, Lipitor.  PT and OT evaluations. 2. Acute hypoxic respiratory failure.  Patient initially required BiPAP but now weaned off BiPAP. 3. Acute diastolic CHF.  Patient given a dose of Lasix 4. Pneumonia on Rocephin and Zithromax. 5. History of gout on allopurinol 6. Hypothyroidism on Synthroid 7. GERD on Protonix 8. Neuropathy on Neurontin  Code Status:     Code Status Orders  (From admission, onward)         Start     Ordered   02/03/19 1245  Full code  Continuous     02/03/19 1250        Code Status History    Date Active Date Inactive Code Status Order ID Comments User Context   02/12/2018 0155 02/14/2018 1604 Full Code [id number]  Lance Coon, MD Inpatient   Advance Care Planning Activity     Family Communication: Spoke with daughter on the phone. Disposition Plan: To be determined  Consultants:  Critical care specialist  Neurology  Time spent: 28 minutes  [place]

## 2019-02-05 NOTE — Progress Notes (Signed)
Pharmacy Electrolyte Monitoring Consult:  Pharmacy consulted to assist in monitoring and replacing electrolytes in this 85 y.o. female admitted on 02/03/2019.   Labs:  Sodium (mmol/L)  Date Value  02/05/2019 140  11/25/2018 143  11/22/2013 140   Potassium (mmol/L)  Date Value  02/05/2019 3.3 (L)  11/22/2013 3.4 (L)   Magnesium (mg/dL)  Date Value  02/04/2019 1.9   Phosphorus (mg/dL)  Date Value  02/04/2019 3.3   Calcium (mg/dL)  Date Value  02/05/2019 8.4 (L)   Calcium, Total (mg/dL)  Date Value  11/22/2013 8.3 (L)   Albumin (g/dL)  Date Value  11/25/2018 3.9  11/22/2013 3.4    Assessment/Plan: Patient received potassium 35mEq PO x 1 and potassium 67mEq IV Q1hr x 4 doses this am.   No further replacement warranted at this time.   BMP/Magnesium with am labs.   Pharmacy will continue to monitor and adjust per consult.    Christy Carter L 02/05/2019 12:40 PM

## 2019-02-05 NOTE — H&P (Signed)
Neurology Consultation   CC: left sided weakness  History is obtained from:chart review - pt unable to provide due to anesthesia  HPI: Christy Carter is a 85 y.o. female PMH of sleep apnea, HTN, HLD, carpal tunnel, stroke who presented to [facility] on 9/24 for SOB. She was admitted by the medical services there, noted to have a pneumonia/UTI. She was noted to have some left sided weakness on initial exam, and again today, with LKW documented to be 0200 02/05/2019. Neurology - Dr. Doy Mince evaluated patient, NIH 7. Pt had a prior stroke that affected the left side but had no residual deficits. Per verbal report signed out to me by the day neurologist excepting the patient-patient has a modified Rankin of 1 at baseline Further imaging was done in the form of MRI of the brain after a CT showed no acute changes.  The MRI brain showed bilateral small acute infarct with the largest being in the deep white matter watershed territory on the right.  It was presumed that the infarcts are embolic in etiology but the MRA showed a severe right M1 stenosis, right vertebral occlusion, moderate to severe right P1 stenosis, severe right P2 stenosis and an occluded left A1 along with mild to moderate left M1 stenosis.  The neurologist over at [facility] regional discussed with the family who wanted aggressive care and she was transferred directly to the IR suite at [facility]- after detailed discussions between Dr. Doy Mince, Dr. Cheral Marker and Dr. Estanislado Pandy for a possible right M1 thrombectomy/angioplasty. I was handed off patient care while she was in the IR suite.  I examined her right after the procedure.  She was still under the effect of anesthesia not able to provide any history. Dr. Estanislado Pandy reports that he had to angioplasty that right MCA M1 segment 3 times prior to obtaining a good flow.  They will attempt to extubate her.  She will be going to the ICU.  LKW: 0200 02/05/2019 tpa given?: no, OSW Premorbid  modified Rankin scale (mRS): 1 -according to verbal report obtained and handoff.  ROS:  Unable to obtain due to altered mental status.   Past Medical History:  Diagnosis Date  . Arthritis   . Carpal tunnel syndrome   . Complication of anesthesia    Difficulty waking up. Believes it took 2 days to wake up after her surgery.  . Depression   . Hyperlipidemia   . Hypertension   . Shortness of breath   . Stroke ([place])   . Thyroid disease     Family History  Problem Relation Age of Onset  . Heart disease Mother   . Hypertension Mother   . Diverticulitis Mother   . Heart disease Father   . Hypertension Father    Social History:   reports that she quit smoking about 26 years ago. Her smoking use included cigarettes. She has never used smokeless tobacco. She reports that she does not drink alcohol or use drugs.  Medications No current facility-administered medications for this encounter.   Facility-Administered Medications Ordered in Other Encounters:  .  ceFAZolin (ANCEF) IVPB 2 g/50 mL premix, , Intravenous, Anesthesia Intra-op, Suzy Bouchard, CRNA, 2 g at 02/05/19 1757 .  dexamethasone (DECADRON) injection, , , Anesthesia Intra-op, Suzy Bouchard, CRNA, 4 mg at 02/05/19 1818 .  eptifibatide (INTEGRILIN) injection, , , PRN, [name], [name], MD, 1.8 mg at 02/05/19 1948 .  heparin injection, , , Anesthesia Intra-op, Suzy Bouchard, CRNA, 1,000 Units at 02/05/19  1838 .  labetalol (NORMODYNE) injection, , , Anesthesia Intra-op, Suzy Bouchard, CRNA, 2.5 mg at 02/05/19 2030 .  lactated ringers infusion, , , Continuous PRN, Suzy Bouchard, CRNA .  lidocaine (cardiac) 100 mg/62mL (XYLOCAINE) injection 2%, , Tracheal Tube, Anesthesia Intra-op, Suzy Bouchard, CRNA, 60 mg at 02/05/19 1745 .  ondansetron (ZOFRAN) injection, , , Anesthesia Intra-op, Suzy Bouchard, CRNA, 4 mg at 02/05/19 1948 .  phenylephrine (NEO-SYNEPHRINE) 0.04 mg/mL in sodium chloride 0.9 % 250 mL infusion,  , , Continuous PRN, Suzy Bouchard, CRNA, Stopped at 02/05/19 2008 .  phenylephrine (NEO-SYNEPHRINE) injection, , , Anesthesia Intra-op, Suzy Bouchard, CRNA, 40 mcg at 02/05/19 1753 .  propofol (DIPRIVAN) 10 mg/mL bolus/IV push, , , Anesthesia Intra-op, Suzy Bouchard, CRNA, 100 mg at 02/05/19 1745 .  rocuronium (ZEMURON) injection, , , Anesthesia Intra-op, Suzy Bouchard, CRNA, 10 mg at 02/05/19 1901 .  succinylcholine (ANECTINE) syringe, , Intravenous, Anesthesia Intra-op, Suzy Bouchard, CRNA, 120 mg at 02/05/19 1745 .  sugammadex sodium (BRIDION) injection, , Intravenous, Anesthesia Intra-op, Suzy Bouchard, CRNA, 200 mg at 02/05/19 2024 .  ticagrelor (BRILINTA) tablet, , , PRN, [name], MD, 180 mg at 02/05/19 1804  Exam: Current vital signs: There were no vitals taken for this visit. Vital signs in last 24 hours: Temp:  [97.5 F (36.4 C)-98.4 F (36.9 C)] 98.2 F (36.8 C) (09/26 1700) Pulse Rate:  [69-99] 88 (09/26 1700) Resp:  [15-30] 21 (09/26 1700) BP: (100-172)/(38-73) 154/54 (09/26 1700) SpO2:  [92 %-100 %] 93 % (09/26 1700) Weight:  [102.5 kg] 102.5 kg (09/26 0232) General: Sedated with general anesthetics, no distress HEENT: Cephalic atraumatic endotracheal tube in place Lungs: Clear to auscultation Cardiovascular: Regular rate rhythm, normal pulses Abdomen: Soft nondistended nontender Extremities: Warm perfused, trace edema, intact pulses Neurological exam Sedated intubated No spontaneous movements Pupils equal round reactive to light Corneal reflex present bilaterally Breathing over the ventilator No spontaneous movements No movement to noxious stimulation   NIH stroke scale prior to anesthesia reported by Dr. Mariana Kaufman I have reviewed labs in epic and the results pertinent to this consultation are:   CBC    Component Value Date/Time   WBC 9.4 02/05/2019 0547   RBC 3.99 02/05/2019 0547   HGB 11.5 (L) 02/05/2019 0547   HGB  15.2 12/15/2017 1039   HCT 37.1 02/05/2019 0547   HCT 43.4 12/15/2017 1039   PLT 234 02/05/2019 0547   PLT 254 12/15/2017 1039   MCV 93.0 02/05/2019 0547   MCV 88 12/15/2017 1039   MCV 93 11/22/2013 1637   MCH 28.8 02/05/2019 0547   MCHC 31.0 02/05/2019 0547   RDW 15.7 (H) 02/05/2019 0547   RDW 13.9 12/15/2017 1039   RDW 13.9 11/22/2013 1637   LYMPHSABS 0.9 02/05/2019 0547   LYMPHSABS 1.9 12/15/2017 1039   LYMPHSABS 2.1 09/06/2013 1544   MONOABS 0.9 02/05/2019 0547   MONOABS 0.8 09/06/2013 1544   EOSABS 0.8 (H) 02/05/2019 0547   EOSABS 0.3 12/15/2017 1039   EOSABS 0.3 09/06/2013 1544   BASOSABS 0.1 02/05/2019 0547   BASOSABS 0.1 12/15/2017 1039   BASOSABS 0.0 09/06/2013 1544    CMP     Component Value Date/Time   NA 140 02/05/2019 0547   NA 143 11/25/2018 1046   NA 140 11/22/2013 1637   K 3.3 (L) 02/05/2019 0547   K 3.4 (L) 11/22/2013 1637   CL 104 02/05/2019 0547   CL 106 11/22/2013  1637   CO2 26 02/05/2019 0547   CO2 25 11/22/2013 1637   GLUCOSE 103 (H) 02/05/2019 0547   GLUCOSE 105 (H) 11/22/2013 1637   BUN 30 (H) 02/05/2019 0547   BUN 34 (H) 11/25/2018 1046   BUN 35 (H) 11/22/2013 1637   CREATININE 0.88 02/05/2019 0547   CREATININE 1.51 (H) 11/22/2013 1637   CALCIUM 8.4 (L) 02/05/2019 0547   CALCIUM 8.3 (L) 11/22/2013 1637   PROT 6.5 11/25/2018 1046   PROT 6.4 11/22/2013 1637   ALBUMIN 3.9 11/25/2018 1046   ALBUMIN 3.4 11/22/2013 1637   AST 14 11/25/2018 1046   AST 16 11/22/2013 1637   ALT 12 11/25/2018 1046   ALT 18 11/22/2013 1637   ALKPHOS 90 11/25/2018 1046   ALKPHOS 81 11/22/2013 1637   BILITOT 0.2 11/25/2018 1046   BILITOT 0.4 11/22/2013 1637   GFRNONAA >60 02/05/2019 0547   GFRNONAA 33 (L) 11/22/2013 1637   GFRAA >60 02/05/2019 0547   GFRAA 38 (L) 11/22/2013 1637    Lipid Panel     Component Value Date/Time   CHOL 196 02/05/2019 0547   CHOL 98 09/07/2013 0358   TRIG 146 02/05/2019 0547   TRIG 63 09/07/2013 0358   HDL 39 (L) 02/05/2019  0547   HDL 43 09/07/2013 0358   CHOLHDL 5.0 02/05/2019 0547   VLDL 29 02/05/2019 0547   VLDL 13 09/07/2013 0358   LDLCALC 128 (H) 02/05/2019 0547   LDLCALC 42 09/07/2013 0358     Imaging I have reviewed the images obtained:  CT-scan of the brain-no acute abnormality MRI brain along with MRA head and neck-acute infarct in the deep white matter on the right and a watershed pattern likely secondary to severe right M1 stenosis.  Small acute infarct in the lateral left posterior temporal lobe.  Carotid bifurcations widely patent in the neck with mild to moderate stenosis of the left petrous carotid.  Left vertebral artery patent.  Right vertebral artery severely diseased with long segment occlusion and partial reconstitution distally.  Moderate to severe right P1 stenosis.  Severe stenosis of right P2.  Moderate stenosis of left P1.  Occluded left A1.  Severe right M1 segment stenosis.  Mild to moderate left M1 stenosis.   Assessment:  85 year old woman with above said past medical history with sudden onset of left-sided weakness with symptoms consistent with a right hemispheric stroke noted to have multiple strokes predominantly in the right watershed pattern due to severe right M1 stenosis sent from [facility] hospital for emergent revascularization. She also does have multiple other diseased segments on both right and left anterior and right and left posterior circulation. She has successfully been revascularized on that right M1 stenosis with angioplasty.  Currently in the IR suite in the process of postop care from the IR procedure.  Will attempt to extubate.  I attempted to call the number listed on the chart for the daughter.  No response.  Impression: Acute ischemic stroke- likely large vessel etiology Cannot rule out embolic phenomenon due to bilateral involvement. Severe intracranial atherosclerosis UTI Pneumonia UTI Toxic metabolic encephalopathy due to the above Acute  hypoxic respiratory failure mild vascular congestion secondary to pneumonia versus atelectasis  Recommendations: Acute Ischemic Stroke Cerebral infarction due to occlusion of right middle cerebral artery  Acuity: Acute Current Suspected Etiology: Atheroembolic versus cardioembolic versus atherosclerotic Continue Evaluation:  -Admit to: ICU -Aspirin Brilinta per neuro endovascular -Continue statin -Blood pressure goal 1 20-1 40 -MRI/ECHO/A1C/Lipid panel. -Hyperglycemia management per SSI  to maintain glucose 140-180mg /dL. -PT/OT/ST therapies and recommendations when able  CNS -Close neuro monitoring  Dysarthria Dysphagia following cerebral infarction  -NPO until cleared by speech -ST  Hemiplegia and hemiparesis following cerebral infarction affecting left non-dominant side  -PT/OT -PM&R consult  Toxi/metabolic c encephalopathy -Correct metabolic causes -Monitor  RESP Acute Respiratory Failure  -vent management per ICU -wean when able  CV Essential (primary) hypertension -Aggressive BP control, goal SBP 1 20-1 40 -Use labetalol hydralazine as needed.  Can use Cleviprex drip.  Other -TTE  Hyperlipidemia, unspecified  - Statin for goal LDL < 70  HEME Iron Deficiency Anemia -Monitor -transfuse for hgb < 7  ENDO Mild hyperglycemia -goal HgbA1c < 7  Hypothyroidism -Home Synthroid  GI/GU UTI -IV ceftriaxone -Gentle hydration  Fluid/Electrolyte Disorders Hyper OR Hypo Potassium  Hyper OR Hypo Calcium Hyper OR Hypo Magnesium  Hyper OR Hypo Phosphorus  -Replete -Repeat labs -Trend -Per dialysis  ID Possible PNA -CXR -NPO -Monitor  Possible UTI -as above   Nutrition E66.9 Obesity  -diet consult  Prophylaxis DVT: SQH   GI: PPI Bowel: Doc/senna   Diet: NPO until cleared by speech/bedside eval  Code Status: Full Code   THE FOLLOWING WERE PRESENT ON ADMISSION: Acute ischemic stroke Intracranial atherosclerosis Possible  pneumonia Possible UTI Toxic metabolic encephalopathy  -- Amie Portland, MD [facility] Pager: [phone] If 7pm to 7am, please call on call as listed on AMION.   CRITICAL CARE ATTESTATION Performed by: Amie Portland, MD Total critical care time: 60 minutes Critical care time was exclusive of separately billable procedures and treating other patients and/or supervising APPs/Residents/Students Critical care was necessary to treat or prevent imminent or life-threatening deterioration due to Acute ischemic stroke  This patient is critically ill and at significant risk for neurological worsening and/or death and care requires constant monitoring. Critical care was time spent personally by me on the following activities: development of treatment plan with patient and/or surrogate as well as nursing, discussions with consultants, evaluation of patient's response to treatment, examination of patient, obtaining history from patient or surrogate, ordering and performing treatments and interventions, ordering and review of laboratory studies, ordering and review of radiographic studies, pulse oximetry, re-evaluation of patient's condition, participation in multidisciplinary rounds and medical decision making of high complexity in the care of this patient.

## 2019-02-05 NOTE — Progress Notes (Addendum)
Shift summary:  - Upon AM assessment patient is exhibiting L sided weakness, dysrthria. MD notified. [place] ordered. NIH is 8.   - CTH is clean. Neuro consult placed. MRI ordered, scheduled for 2PM today per MRI's availability.   - Patient transported off of unit by this RN to MRI @ 1100 AM, as Rad's availability changed.   - Patient back on unit ~ 1205 hrs, tolerated MRI well.  - MRI resulted with acute stroke. DR's Aleskerov, Wieting, and [employer].   - Patient transported off of unit by [facility] at 1709 hrs en route to [facility] ED to await IR procedure.   - Report given to [facility] ED Charge RN.

## 2019-02-05 NOTE — Anesthesia Preprocedure Evaluation (Addendum)
Anesthesia Evaluation  Patient identified by MRN, date of birth, ID bandPreop documentation limited or incomplete due to emergent nature of procedure.  History of Anesthesia Complications (+) PROLONGED EMERGENCE and history of anesthetic complications  Airway Mallampati: III  TM Distance: >3 FB Neck ROM: Full    Dental  (+) Poor Dentition, Missing   Pulmonary shortness of breath, sleep apnea , neg recent URI, former smoker,    breath sounds clear to auscultation       Cardiovascular hypertension,  Rhythm:Irregular     Neuro/Psych PSYCHIATRIC DISORDERS Depression  Neuromuscular disease CVA, Residual Symptoms    GI/Hepatic GERD  ,  Endo/Other  Hypothyroidism Morbid obesity  Renal/GU Renal disease     Musculoskeletal  (+) Arthritis ,   Abdominal   Peds  Hematology  (+) anemia ,   Anesthesia Other Findings EKG with PAC Previous stroke (right) with no residual admited 9/24 with SOB, LAsix Bipap  Reproductive/Obstetrics                            Anesthesia Physical Anesthesia Plan  ASA: III and emergent  Anesthesia Plan: General   Post-op Pain Management:    Induction: Intravenous, Rapid sequence and Cricoid pressure planned  PONV Risk Score and Plan: 3 and Ondansetron and Dexamethasone  Airway Management Planned: Oral ETT  Additional Equipment: Arterial line  Intra-op Plan:   Post-operative Plan: Possible Post-op intubation/ventilation  Informed Consent:     Only emergency history available and History available from chart only  Plan Discussed with: CRNA and Surgeon  Anesthesia Plan Comments:        Anesthesia Quick Evaluation

## 2019-02-05 NOTE — Transfer of Care (Signed)
Immediate Anesthesia Transfer of Care Note  Patient: Christy Carter  Procedure(s) Performed: CODE STROKE (N/A )  Patient Location: ICU  Anesthesia Type:General  Level of Consciousness: awake and alert . Oriented to person and time.  Thinks she is at home. Follows commands, speech ok.  Not moving left side.  Airway & Oxygen Therapy: Patient Spontanous Breathing and Patient connected to face mask oxygen  Post-op Assessment: Report given to RN and Post -op Vital signs reviewed and stable  Post vital signs: Reviewed and stable. B/P 150 by cuff and 170 by A-line.  RN with orders for Cleviprex.  Last Vitals:  Vitals Value Taken Time  BP 153/82 02/05/19 2117  Temp    Pulse 74 02/05/19 2121  Resp 20 02/05/19 2121  SpO2 99 % 02/05/19 2121  Vitals shown include unvalidated device data.  Last Pain:  Vitals:   02/05/19 2105  PainSc: Asleep         Complications: No apparent anesthesia complications

## 2019-02-05 NOTE — Progress Notes (Signed)
PHARMACIST - PHYSICIAN COMMUNICATION  CONCERNING: Antibiotic IV to Oral Route Change Policy  RECOMMENDATION: This patient is receiving azithromycin by the intravenous route.  Based on criteria approved by the Pharmacy and Therapeutics Committee, the antibiotic(s) is/are being converted to the equivalent oral dose form(s).   DESCRIPTION: These criteria include:  Patient being treated for a respiratory tract infection, urinary tract infection, cellulitis or clostridium difficile associated diarrhea if on metronidazole  The patient is not neutropenic and does not exhibit a GI malabsorption state  The patient is eating (either orally or via tube) and/or has been taking other orally administered medications for a least 24 hours  The patient is improving clinically and has a Tmax < 100.5  If you have questions about this conversion, please contact the Pharmacy Department at [phone].   MLS, RPh  

## 2019-02-05 NOTE — Plan of Care (Signed)

## 2019-02-05 NOTE — Progress Notes (Signed)
Patient ID: EYVETTE PIPER, female   DOB: [birth year], 85 y.o.   MRN: [id number] INR  85 Y RT F MRSS 1 LSW  200am LN. New onset of Lt sided weakness and dysarthria. CT Brain NO ICH .ASPECTS 10 cta PRE OCCLUSIVE STENOSIS OF rt mca m 1 SEG WITH THROMBUS? \MRI brain small focal DWI restriction in the RT centrum semiovale and perivent WM Rapid unavailable. Endovascular revascularization treatment to prevent eminent complete closure D/W daughter by Dr Doy Mince and myself. Procedure reasons and risks discussed. Risks of ICH of 10 % ,neurological decline ,death and inability to revascularize reviewed. She expressed understanding and provided consent to proceed. S.Annalee Meyerhoff MD.

## 2019-02-06 ENCOUNTER — Inpatient Hospital Stay (HOSPITAL_COMMUNITY): Payer: Medicare Other

## 2019-02-06 ENCOUNTER — Inpatient Hospital Stay: Admit: 2019-02-06 | Payer: Medicare Other

## 2019-02-06 ENCOUNTER — Other Ambulatory Visit: Payer: Self-pay

## 2019-02-06 DIAGNOSIS — I63513 Cerebral infarction due to unspecified occlusion or stenosis of bilateral middle cerebral arteries: Secondary | ICD-10-CM

## 2019-02-06 LAB — CBC WITH DIFFERENTIAL/PLATELET
Abs Immature Granulocytes: 0.09 10*3/uL — ABNORMAL HIGH (ref 0.00–0.07)
Basophils Absolute: 0 10*3/uL (ref 0.0–0.1)
Basophils Relative: 0 %
Eosinophils Absolute: 0 10*3/uL (ref 0.0–0.5)
Eosinophils Relative: 0 %
HCT: 39 % (ref 36.0–46.0)
Hemoglobin: 12.7 g/dL (ref 12.0–15.0)
Immature Granulocytes: 1 %
Lymphocytes Relative: 4 %
Lymphs Abs: 0.5 10*3/uL — ABNORMAL LOW (ref 0.7–4.0)
MCH: 30.2 pg (ref 26.0–34.0)
MCHC: 32.6 g/dL (ref 30.0–36.0)
MCV: 92.6 fL (ref 80.0–100.0)
Monocytes Absolute: 0.3 10*3/uL (ref 0.1–1.0)
Monocytes Relative: 2 %
Neutro Abs: 10.6 10*3/uL — ABNORMAL HIGH (ref 1.7–7.7)
Neutrophils Relative %: 93 %
Platelets: 282 10*3/uL (ref 150–400)
RBC: 4.21 MIL/uL (ref 3.87–5.11)
RDW: 15.6 % — ABNORMAL HIGH (ref 11.5–15.5)
WBC: 11.5 10*3/uL — ABNORMAL HIGH (ref 4.0–10.5)
nRBC: 0 % (ref 0.0–0.2)

## 2019-02-06 LAB — BASIC METABOLIC PANEL
Anion gap: 11 (ref 5–15)
BUN: 21 mg/dL (ref 8–23)
CO2: 23 mmol/L (ref 22–32)
Calcium: 8.8 mg/dL — ABNORMAL LOW (ref 8.9–10.3)
Chloride: 105 mmol/L (ref 98–111)
Creatinine, Ser: 0.98 mg/dL (ref 0.44–1.00)
GFR calc Af Amer: >60 mL/min (ref >60)
GFR calc non Af Amer: 53 mL/min — ABNORMAL LOW (ref >60)
Glucose, Bld: 163 mg/dL — ABNORMAL HIGH (ref 70–99)
Potassium: 4.1 mmol/L (ref 3.5–5.1)
Sodium: 139 mmol/L (ref 135–145)

## 2019-02-06 LAB — LIPID PANEL
Cholesterol: 213 mg/dL — ABNORMAL HIGH (ref 0–200)
HDL: 44 mg/dL (ref >40)
LDL Cholesterol: 139 mg/dL — ABNORMAL HIGH (ref 0–99)
Total CHOL/HDL Ratio: 4.8 RATIO
Triglycerides: 152 mg/dL — ABNORMAL HIGH (ref <150)
VLDL: 30 mg/dL (ref 0–40)

## 2019-02-06 LAB — MRSA PCR SCREENING: MRSA by PCR: NEGATIVE

## 2019-02-06 LAB — HEMOGLOBIN A1C
Hgb A1c MFr Bld: 5.6 % (ref 4.8–5.6)
Mean Plasma Glucose: 114.02 mg/dL

## 2019-02-06 SURGERY — RADIOLOGY WITH ANESTHESIA
Anesthesia: Choice

## 2019-02-06 MED ORDER — BISOPROLOL FUMARATE 5 MG PO TABS
2.5000 mg | ORAL_TABLET | Freq: Every day | ORAL | Status: DC
  Administered 2019-02-07 – 2019-02-11 (×5): 2.5 mg via ORAL
  Filled 2019-02-06 (×5): qty 0.5

## 2019-02-06 MED ORDER — ATORVASTATIN CALCIUM 40 MG PO TABS
40.0000 mg | ORAL_TABLET | Freq: Every day | ORAL | Status: DC
  Administered 2019-02-06 – 2019-02-10 (×4): 40 mg via ORAL
  Filled 2019-02-06 (×4): qty 1

## 2019-02-06 MED ORDER — CLEVIDIPINE BUTYRATE 0.5 MG/ML IV EMUL
0.0000 mg/h | INTRAVENOUS | Status: AC
  Administered 2019-02-07: 19 mg/h via INTRAVENOUS
  Administered 2019-02-07: 22 mg/h via INTRAVENOUS
  Administered 2019-02-07: 20 mg/h via INTRAVENOUS
  Administered 2019-02-07: 22 mg/h via INTRAVENOUS
  Administered 2019-02-07: 14 mg/h via INTRAVENOUS
  Administered 2019-02-07: 08:00:00 10 mg/h via INTRAVENOUS
  Filled 2019-02-06 (×3): qty 100

## 2019-02-06 MED ORDER — BISOPROLOL-HYDROCHLOROTHIAZIDE 2.5-6.25 MG PO TABS
1.0000 | ORAL_TABLET | Freq: Every day | ORAL | Status: DC
  Filled 2019-02-06: qty 1

## 2019-02-06 MED ORDER — TRIAMTERENE-HCTZ 37.5-25 MG PO TABS
1.0000 | ORAL_TABLET | Freq: Every day | ORAL | Status: DC
  Administered 2019-02-07 – 2019-02-11 (×5): 1
  Filled 2019-02-06 (×6): qty 1

## 2019-02-06 NOTE — Progress Notes (Signed)
STROKE TEAM PROGRESS NOTE   HISTORY OF PRESENT ILLNESS (per record) Christy Carter is a 85 y.o. female PMH of sleep apnea, HTN, HLD, carpal tunnel, stroke who presented to [facility] on 9/24 for SOB. She was admitted by the medical services there, noted to have a pneumonia/UTI. She was noted to have some left sided weakness on initial exam, and again today, with LKW documented to be 0200 02/05/2019. Neurology - Dr. Doy Mince evaluated patient, NIH 7. Pt had a prior stroke that affected the left side but had no residual deficits. Per verbal report signed out to me by the day neurologist excepting the patient-patient has a modified Rankin of 1 at baseline Further imaging was done in the form of MRI of the brain after a CT showed no acute changes.  The MRI brain showed bilateral small acute infarct with the largest being in the deep white matter watershed territory on the right.  It was presumed that the infarcts are embolic in etiology but the MRA showed a severe right M1 stenosis, right vertebral occlusion, moderate to severe right P1 stenosis, severe right P2 stenosis and an occluded left A1 along with mild to moderate left M1 stenosis.  The neurologist over at [facility] regional discussed with the family who wanted aggressive care and she was transferred directly to the IR suite at [facility]- after detailed discussions between Dr. Doy Mince, Dr. Cheral Marker and Dr. Estanislado Pandy for a possible right M1 thrombectomy/angioplasty. I was handed off patient care while she was in the IR suite.  I examined her right after the procedure.  She was still under the effect of anesthesia not able to provide any history. Dr. Estanislado Pandy reports that he had to angioplasty that right MCA M1 segment 3 times prior to obtaining a good flow.  They will attempt to extubate her.  She will be going to the ICU.  LKW: 0200 02/05/2019 tpa given?: no, OSW Premorbid modified Rankin scale (mRS): 1 -according to verbal report obtained and  handoff.   INTERVAL HISTORY Her nurse is at bedside. She is sitting up, on nasal cannula 4L, alert, not oriented to place, left hemiparesis.     OBJECTIVE Vitals:   02/06/19 0700 02/06/19 0751 02/06/19 0800 02/06/19 0900  BP: (!) 118/56  134/68 (!) 120/94  Pulse: 94 95 95 95  Resp: (!) 31 (!) 25 17 (!) 21  Temp:   99 F (37.2 C)   TempSrc:   Axillary   SpO2: 93% 95% 93% 91%    CBC:  Recent Labs  Lab 02/05/19 0547 02/06/19 0252  WBC 9.4 11.5*  NEUTROABS 6.7 10.6*  HGB 11.5* 12.7  HCT 37.1 39.0  MCV 93.0 92.6  PLT 234 [id number]    Basic Metabolic Panel:  Recent Labs  Lab 02/04/19 0401 02/05/19 0547 02/06/19 0252  NA 145 140 139  K 3.0* 3.3* 4.1  CL 107 104 105  CO2 26 26 23   GLUCOSE 106* 103* 163*  BUN 23 30* 21  CREATININE 1.00 0.88 0.98  CALCIUM 8.3* 8.4* 8.8*  MG 1.9  --   --   PHOS 3.3  --   --     Lipid Panel:     Component Value Date/Time   CHOL 213 (H) 02/06/2019 0252   CHOL 98 09/07/2013 0358   TRIG 152 (H) 02/06/2019 0252   TRIG 63 09/07/2013 0358   HDL 44 02/06/2019 0252   HDL 43 09/07/2013 0358   CHOLHDL 4.8 02/06/2019 0252   VLDL 30  02/06/2019 0252   VLDL 13 09/07/2013 0358   LDLCALC 139 (H) 02/06/2019 0252   LDLCALC 42 09/07/2013 0358   HgbA1c:  Lab Results  Component Value Date   HGBA1C 5.6 02/06/2019   Urine Drug Screen: No results found for: LABOPIA, COCAINSCRNUR, LABBENZ, AMPHETMU, THCU, LABBARB  Alcohol Level No results found for: ETH  IMAGING  Ct Angio Head W Or Wo Contrast Ct Angio Neck W Or Wo Contrast Ct Cerebral Perfusion W Contrast 02/05/2019 IMPRESSION:   CT angiography confirms the MR findings.   Severe stenosis with near occlusion of the right M1 segment.   Occlusion of the left A1 segment.   Moderate stenosis of the right P1 P2 junction. 40% stenosis at the right carotid bifurcation.   Right vertebral artery occluded at its origin. Reconstituted flow and retrograde flow in the distal right vertebral artery.  50% proximal basilar stenosis.   No CBF less than 30% identified by this study. Possible reperfusion of the completed infarctions of the deep brain on the right shown by MRI.   Small focus of T-max greater than 6 seconds in the right parietal region. 110 cc region of T-max greater than 4 seconds throughout the right middle cerebral artery distribution, indicating at risk brain secondary to the near occlusion of the right MCA.    Ct Head Wo Contrast 02/05/2019  IMPRESSION:  No change from yesterday Moderate atrophy. Microvascular ischemic changes are present a white matter right greater than left, stable from prior study.    Mr Angio Head Wo Contrast Mr Angio Neck W Wo Contrast Mr Brain Wo Contrast 02/05/2019  IMPRESSION:  1. Acute infarct deep white matter on the right in a watershed territory, likely due to severe stenosis right M1 segment.  2. Small acute infarct left lateral posterior temporal lobe  3. Carotid bifurcation widely patent in the neck. Mild to moderate stenosis of the left petrous carotid.  4. Left vertebral artery widely patent. Right vertebral artery severely diseased with long segment occlusion and partial reconstitution distally.  5. Moderate to severe stenosis right P1. Severe stenosis right P2 segment. Moderate stenosis left P1 segment.  6. Occluded left A1 segment. Severe stenosis right M1 segment. Mild to moderate stenosis left M1.   Interventional Radiology 02/05/2019 S/balloon angioplasty x 3 of pre occlusive symptomatic rt mca m1 SEG TO IMPROVED CALIBER TO APPROX 70 %.    Transthoracic Echocardiogram  02/03/2019 IMPRESSIONS  1. Left ventricular ejection fraction, by visual estimation, is 65 to 70%. The left ventricle has hyperdynamic function. There is no left ventricular hypertrophy.  2. Global right ventricle has normal systolic function.The right ventricular size is normal. No increase in right ventricular wall thickness.  3. Left atrial size was  normal.  4. Right atrial size was normal.  5. The mitral valve is normal in structure. Trace mitral valve regurgitation.  6. The tricuspid valve is normal in structure. Tricuspid valve regurgitation is trivial.  7. The aortic valve is normal in structure. Aortic valve regurgitation was not visualized by color flow Doppler.  8. The pulmonic valve was not well visualized. Pulmonic valve regurgitation was not assessed by color flow Doppler.  9. The aortic root was not well visualized. 10. The interatrial septum was not well visualized.   ECG - ST rate 104   BPM. (See cardiology reading for complete details)    PHYSICAL EXAM Blood pressure (!) 120/94, pulse 95, temperature 99 F (37.2 C), temperature source Axillary, resp. rate (!) 21, SpO2 91 %.  Exam: NAD, pleasant                  Speech:    Speech is dysarthic, not aphasic;   Cognition:    The patient is oriented to person, month and year but thinks she is at the [facility] office. Following simple commands.     Cranial Nerves:    The pupils are equal, round, and reactive to light. Does not blink to threat. EOMI. Trigeminal sensation is intact and the muscles of mastication are normal. NL flattening on the right. The palate elevates in the midline. Hearing intact to voice. Voice is normal. Shoulder shrug is normal. The tongue is midline.  Coordination:  ataxic on the right arm to FTN  Motor Observation:    Head and chin tremor Tone:    Increased right leg    Strength: left arm and leg weakness 2+/5, right arm and leg antigravity without drift         Sensation: decreased left arm and left leg, intact right  Reflexes: upgoing toe on left   ASSESSMENT/PLAN Christy Carter is a 85 y.o. female with history of sleep apnea, HTN, HLD, carpal tunnel, previous stroke presenting with UTI, pneumonia and left sided weakness. She did not receive IV t-PA due to late presentation (>4.5 hours from time of onset). Interventional  Radiology 02/05/2019 S/balloon angioplasty x 3 of pre occlusive symptomatic rt mca m1 SEG TO IMPROVED CALIBER TO APPROX 70 %.   Stroke:  Acute infarct deep white matter on the right in a watershed territory, likely due to severe stenosis right M1 segment. Small acute (lacunar vs embolic from large vessel disease) infarct left lateral posterior temporal lobe   Code Stroke CT Head - not ordered  CT head - No change from yesterday Moderate atrophy. Microvascular ischemic changes are present a white matter right greater than left, stable from prior study.   MRI head -  Acute infarct deep white matter on the right in a watershed territory, likely due to severe stenosis right M1 segment. Small acute infarct left lateral posterior temporal lobe   MRA H&N -  Right vertebral artery severely diseased with long segment occlusion and partial reconstitution distally. Moderate to severe stenosis right P1. Severe stenosis right P2 segment. Moderate stenosis left P1 segment. Occluded left A1 segment. Severe stenosis right M1 segment. Mild to moderate stenosis left M1.   CTA H&N - Severe stenosis with near occlusion of the right M1 segment. Occlusion of the left A1 segment. Moderate stenosis of the right P1 P2 junction. 40% stenosis at the right carotid bifurcation. Right vertebral artery occluded at its origin. Reconstituted flow and retrograde flow in the distal right vertebral artery. 50% proximal basilar stenosis.    CT Perfusion - No CBF less than 30% identified by this study. Possible reperfusion of the completed infarctions of the deep brain on the right shown by MRI. Small focus of T-max greater than 6 seconds in the right parietal region. 110 cc region of T-max greater than 4 seconds throughout the right middle cerebral artery distribution, indicating at risk brain secondary to the near occlusion of the right MCA.   Carotid Doppler - CTA neck performed - carotid dopplers not indicated.  2D Echo - EF 65 -  70%. No cardiac source of emboli identified.   Sars Corona Virus 2 - negative  LDL - 139  HgbA1c - 5.6  UDS - not ordered  VTE prophylaxis - [place] Heparin Diet  Diet  Order            Diet NPO time specified  Diet effective now              aspirin 325 mg daily prior to admission, now on aspirin 81 mg daily and Brilinta  Patient counseled to be compliant with her antithrombotic medications  Ongoing aggressive stroke risk factor management  Therapy recommendations:  pending  Disposition:  Pending  Hypertension  Home BP meds: Ziac / Maxzide  Current BP meds: Ziac / Maxzide / Cleviprex  Stable . Permissive hypertension (OK if < 220/120) but gradually normalize in 5-7 days  . Long-term BP goal normotensive  Hyperlipidemia  Home Lipid lowering medication:  none  LDL 139, goal < 70  Current lipid lowering medication: Will start Lipitor 40 mg daily   Continue statin at discharge  Other Stroke Risk Factors  Advanced age  Former cigarette smoker - quit  Obesity, recommend weight loss, diet and exercise as appropriate   Hx stroke/TIA  Obstructive sleep apnea   Other Active Problems  UTI - Rocephin  Mild Leukocytosis - 11.5 (temp - 99 )  Hypokalemia- corrected  PLAN  Resume home anti-htn meds and wean Cleviprex      Hospital day # 1  Personally examined patient and images, and have participated in and made any corrections needed to history, physical, neuro exam,assessment and plan as stated above.  I have personally obtained the history, evaluated lab date, reviewed imaging studies and agree with radiology interpretations.    Sarina Ill, MD Stroke Neurology   A total of 35 minutes was spent for the care of this patient, spent on counseling patient and family on different diagnostic and therapeutic options, counseling and coordination of care, riskd ans benefits of management, compliance, or risk factor reduction and education.   To  contact Stroke Continuity provider, please refer to [email or web address] After hours, contact General Neurology

## 2019-02-06 NOTE — Progress Notes (Addendum)
Referring Physician(s): Code Stroke- Kerney Elbe  Supervising Physician: [name]  Patient Status:  [facility] - In-pt  Chief Complaint: "Bad all over"  Subjective:  Right MCA M1 segment pre-occlusive stenosis s/p emergent endovascular revascularization using balloon angioplasty x3 02/05/2019 by Dr. Estanislado Pandy. Patient awake and alert laying in bed. States she feels "bad all over" but denies pain. Can spontaneously move right side, can lift LLE off bed but cannot hold up, and can give thumbs up/hand grip of LUE but cannot lift. Right groin incision c/d/i.   Allergies: Patient has no known allergies.  Medications: Prior to Admission medications   Medication Sig Start Date End Date Taking? Authorizing Provider  allopurinol (ZYLOPRIM) 100 MG tablet Take 2 tabs at bed time for gout 01/12/19   Ronnell Freshwater, NP  aspirin 325 MG tablet Take 1 tablet (325 mg total) by mouth daily. 02/06/19   Ottie Glazier, MD  bisoprolol-hydrochlorothiazide (ZIAC) 2.5-6.25 MG tablet Take 1 tablet by mouth daily. 01/12/19   Ronnell Freshwater, NP  celecoxib (CELEBREX) 200 MG capsule Take 1 capsule (200 mg total) by mouth daily. 07/21/18   Ronnell Freshwater, NP  colchicine 0.6 MG tablet Take 1 tablet (0.6 mg total) by mouth 2 (two) times daily. 12/06/18   Ronnell Freshwater, NP  Cyanocobalamin (B-12) 2500 MCG TABS Take 2,500 mcg by mouth daily. As needed for lethargy    [provider]  gabapentin (NEURONTIN) 100 MG capsule Take 1 tab po twice a day for gout 01/31/19   Ronnell Freshwater, NP  HYDROcodone-acetaminophen (NORCO/VICODIN) 5-325 MG tablet Take 1 tablet by mouth 3 (three) times daily as needed for moderate pain. 01/12/19   Ronnell Freshwater, NP  ibuprofen (ADVIL) 200 MG tablet Take 200 mg by mouth every 6 (six) hours as needed.    [provider]  levothyroxine (SYNTHROID, LEVOTHROID) 100 MCG tablet Take 1 tablet (100 mcg total) by mouth daily. Patient taking differently: Take 100  mcg by mouth daily before breakfast.  06/28/18   Ronnell Freshwater, NP  nitrofurantoin, macrocrystal-monohydrate, (MACROBID) 100 MG capsule Take 1 capsule (100 mg total) by mouth 2 (two) times daily. 02/02/19   Kendell Bane, NP  omeprazole (PRILOSEC) 40 MG capsule Take 1 capsule (40 mg total) by mouth daily. 11/24/18   Kendell Bane, NP  pneumococcal 23 valent vaccine (PNEUMOVAX 23) 25 MCG/0.5ML injection Inject 0.83ml IM once 01/12/19   Leretha Pol E, NP  potassium chloride (K-DUR) 10 MEQ tablet Take 10 mEq by mouth 2 (two) times daily. 07/16/18 07/16/19  [provider]  triamterene-hydrochlorothiazide (MAXZIDE-25) 37.5-25 MG tablet TAKE 1 TABLET BY MOUTH EVERY DAY FOR BLOOD PRESSURE 01/31/19   [place]ar[place], Audie Clear, NP  VENTOLIN HFA 108 (90 Base) MCG/ACT inhaler TAKE 2 PUFFS BY MOUTH EVERY 6 HOURS AS NEEDED FOR WHEEZE OR SHORTNESS OF BREATH 03/19/18   Ronnell Freshwater, NP  Vitamin D, Ergocalciferol, (DRISDOL) 1.25 MG (50000 UT) CAPS capsule Take 1 capsule (50,000 Units total) by mouth every 7 (seven) days. 02/02/19   Kendell Bane, NP  vitamin E 400 UNIT capsule Take 400 Units by mouth daily.    [provider]     Vital Signs: BP (!) 120/94   Pulse 95   Temp 99 F (37.2 C) (Axillary)   Resp (!) 21   SpO2 91%   Physical Exam Vitals signs and nursing note reviewed.  Constitutional:      General: She is not in acute distress.  Appearance: Normal appearance.  Pulmonary:     Effort: Pulmonary effort is normal. No respiratory distress.  Skin:    General: Skin is warm and dry.     Comments: Right groin incision soft without active bleeding or hematoma.  Neurological:     Mental Status: She is alert.     Comments: Alert, awake, and oriented x3. Speech and comprehension intact. PERRL bilaterally. No facial asymmetry. Tongue midline. Can spontaneously move right side, can lift LLE off bed but cannot hold up, and can give thumbs up/hand grip of LUE but cannot  lift. Distal pulses palpable bilaterally with Doppler.     Imaging: Ct Angio Head W Or Wo Contrast  Result Date: 02/05/2019 CLINICAL DATA:  Stroke. Left-sided weakness. Right-sided watershed infarction by MRI. Small left temporal stroke. EXAM: CT ANGIOGRAPHY HEAD AND NECK CT PERFUSION BRAIN TECHNIQUE: Multidetector CT imaging of the head and neck was performed using the standard protocol during bolus administration of intravenous contrast. Multiplanar CT image reconstructions and MIPs were obtained to evaluate the vascular anatomy. Carotid stenosis measurements (when applicable) are obtained utilizing NASCET criteria, using the distal internal carotid diameter as the denominator. Multiphase CT imaging of the brain was performed following IV bolus contrast injection. Subsequent parametric perfusion maps were calculated using RAPID software. CONTRAST:  145mL OMNIPAQUE IOHEXOL 350 MG/ML SOLN COMPARISON:  MRI studies same day FINDINGS: CTA NECK FINDINGS Aortic arch: Aortic atherosclerosis. Branching pattern shows atherosclerotic disease. There is motion degradation. I do not suspect a flow limiting origin stenosis. Left vertebral arises directly from the arch. Right carotid system: Common carotid artery widely patent to the bifurcation. Calcified plaque at the carotid bifurcation and proximal ICA bulb. Minimal diameter is 3 mm. Compared to a more distal cervical ICA diameter of 5 mm, this indicates a 40% stenosis. Left carotid system: Common carotid artery is widely patent to the bifurcation region. Calcified plaque at the carotid bifurcation. No stenosis. Vertebral arteries: Right vertebral artery is occluded at the origin. Left vertebral artery originates from the arch. The origin cannot be accurately evaluated. The left vertebral artery is patent through the cervical region to the foramen magnum. There is reconstitution of the right vertebral artery at C1. Skeleton: Ordinary spondylosis. Other neck: No mass  or lymphadenopathy. Upper chest: Negative Review of the MIP images confirms the above findings CTA HEAD FINDINGS Anterior circulation: Both internal carotid arteries are patent through the skull base and siphon regions. There is atherosclerotic calcification in both carotid siphon regions without stenosis greater than 30%. The right A1 segment appears widely patent. There is severe stenosis in the right M1 segment with near occlusion. Distal vessels do show good opacification. Both anterior cerebral arteries opacified normally, thanks to the presence of a patent anterior communicating artery. The left A1 segment is occluded. The left M1 segment is widely patent. Left MCA branch vessels show flow. Stenosis at the origin of the left temporal branch. Posterior circulation: Left vertebral artery is widely patent through the foramen magnum to the basilar. There is retrograde and or reconstituted flow in the distal right vertebral artery. Multiple stenoses are present in the right V4 segment. There is moderate stenosis of the proximal basilar artery, 50%. More distal basilar artery is widely patent. Superior cerebellar and posterior cerebral arteries are patent. There is moderate stenosis at the right P1 P2 junction. Venous sinuses: Patent and normal. Anatomic variants: None other significant. Review of the MIP images confirms the above findings CT Brain Perfusion Findings: ASPECTS: 10 CBF (<30%)  Volume: 64mL Perfusion (Tmax>6.0s) volume: 3 ccmL. There is a 110 cc region of T-max greater than 4 seconds in the right middle cerebral artery territory, secondary to the severe M1 stenosis. Mismatch Volume: 47mL Infarction Location:No infarction identified. At risk brain in the posterior parietal region on the right measuring 3 cc. If 1 considers the T-max greater than 4 second brain to be at risk, there is considerable at risk brain throughout the right MCA territory. IMPRESSION: CT angiography confirms the MR findings. Severe  stenosis with near occlusion of the right M1 segment. Occlusion of the left A1 segment. Moderate stenosis of the right P1 P2 junction. 40% stenosis at the right carotid bifurcation. Right vertebral artery occluded at its origin. Reconstituted flow and retrograde flow in the distal right vertebral artery. 50% proximal basilar stenosis. No CBF less than 30% identified by this study. Possible reperfusion of the completed infarctions of the deep brain on the right shown by MRI. Small focus of T-max greater than 6 seconds in the right parietal region. 110 cc region of T-max greater than 4 seconds throughout the right middle cerebral artery distribution, indicating at risk brain secondary to the near occlusion of the right MCA. These results were called by telephone at the time of interpretation on 02/05/2019 at 3:47 pm to provider [facility] , who verbally acknowledged these results. Electronically Signed   By: Nelson Chimes M.D.   On: 02/05/2019 15:55   Ct Head Wo Contrast  Result Date: 02/05/2019 CLINICAL DATA:  Focal neuro deficit.  Left-sided weakness dysarthria EXAM: CT HEAD WITHOUT CONTRAST TECHNIQUE: Contiguous axial images were obtained from the base of the skull through the vertex without intravenous contrast. COMPARISON:  CT head 02/03/2019 FINDINGS: Brain: Moderate atrophy and ventricular enlargement, stable. Patchy hypodensity in the white matter right greater than left, stable. Negative for hemorrhage or mass.  No midline shift. Vascular: Negative for hyperdense vessel Skull: Negative Sinuses/Orbits: Negative Other: None IMPRESSION: No change from yesterday Moderate atrophy. Microvascular ischemic changes are present a white matter right greater than left, stable from prior study. Electronically Signed   By: Franchot Gallo M.D.   On: 02/05/2019 08:59   Ct Head Wo Contrast  Result Date: 02/03/2019 CLINICAL DATA:  Altered level of consciousness, fall EXAM: CT HEAD WITHOUT CONTRAST TECHNIQUE:  Contiguous axial images were obtained from the base of the skull through the vertex without intravenous contrast. COMPARISON:  09/06/2013 FINDINGS: Brain: There is atrophy and chronic small vessel disease changes. No acute intracranial abnormality. Specifically, no hemorrhage, hydrocephalus, mass lesion, acute infarction, or significant intracranial injury. Vascular: No hyperdense vessel or unexpected calcification. Skull: No acute calvarial abnormality. Sinuses/Orbits: Visualized paranasal sinuses and mastoids clear. Orbital soft tissues unremarkable. Other: None IMPRESSION: Atrophy, chronic microvascular disease. No acute intracranial abnormality. Electronically Signed   By: Rolm Baptise M.D.   On: 02/03/2019 18:48   Ct Angio Neck W Or Wo Contrast  Result Date: 02/05/2019 CLINICAL DATA:  Stroke. Left-sided weakness. Right-sided watershed infarction by MRI. Small left temporal stroke. EXAM: CT ANGIOGRAPHY HEAD AND NECK CT PERFUSION BRAIN TECHNIQUE: Multidetector CT imaging of the head and neck was performed using the standard protocol during bolus administration of intravenous contrast. Multiplanar CT image reconstructions and MIPs were obtained to evaluate the vascular anatomy. Carotid stenosis measurements (when applicable) are obtained utilizing NASCET criteria, using the distal internal carotid diameter as the denominator. Multiphase CT imaging of the brain was performed following IV bolus contrast injection. Subsequent parametric perfusion maps were calculated using  RAPID software. CONTRAST:  160mL OMNIPAQUE IOHEXOL 350 MG/ML SOLN COMPARISON:  MRI studies same day FINDINGS: CTA NECK FINDINGS Aortic arch: Aortic atherosclerosis. Branching pattern shows atherosclerotic disease. There is motion degradation. I do not suspect a flow limiting origin stenosis. Left vertebral arises directly from the arch. Right carotid system: Common carotid artery widely patent to the bifurcation. Calcified plaque at the carotid  bifurcation and proximal ICA bulb. Minimal diameter is 3 mm. Compared to a more distal cervical ICA diameter of 5 mm, this indicates a 40% stenosis. Left carotid system: Common carotid artery is widely patent to the bifurcation region. Calcified plaque at the carotid bifurcation. No stenosis. Vertebral arteries: Right vertebral artery is occluded at the origin. Left vertebral artery originates from the arch. The origin cannot be accurately evaluated. The left vertebral artery is patent through the cervical region to the foramen magnum. There is reconstitution of the right vertebral artery at C1. Skeleton: Ordinary spondylosis. Other neck: No mass or lymphadenopathy. Upper chest: Negative Review of the MIP images confirms the above findings CTA HEAD FINDINGS Anterior circulation: Both internal carotid arteries are patent through the skull base and siphon regions. There is atherosclerotic calcification in both carotid siphon regions without stenosis greater than 30%. The right A1 segment appears widely patent. There is severe stenosis in the right M1 segment with near occlusion. Distal vessels do show good opacification. Both anterior cerebral arteries opacified normally, thanks to the presence of a patent anterior communicating artery. The left A1 segment is occluded. The left M1 segment is widely patent. Left MCA branch vessels show flow. Stenosis at the origin of the left temporal branch. Posterior circulation: Left vertebral artery is widely patent through the foramen magnum to the basilar. There is retrograde and or reconstituted flow in the distal right vertebral artery. Multiple stenoses are present in the right V4 segment. There is moderate stenosis of the proximal basilar artery, 50%. More distal basilar artery is widely patent. Superior cerebellar and posterior cerebral arteries are patent. There is moderate stenosis at the right P1 P2 junction. Venous sinuses: Patent and normal. Anatomic variants: None  other significant. Review of the MIP images confirms the above findings CT Brain Perfusion Findings: ASPECTS: 10 CBF (<30%) Volume: 64mL Perfusion (Tmax>6.0s) volume: 3 ccmL. There is a 110 cc region of T-max greater than 4 seconds in the right middle cerebral artery territory, secondary to the severe M1 stenosis. Mismatch Volume: 57mL Infarction Location:No infarction identified. At risk brain in the posterior parietal region on the right measuring 3 cc. If 1 considers the T-max greater than 4 second brain to be at risk, there is considerable at risk brain throughout the right MCA territory. IMPRESSION: CT angiography confirms the MR findings. Severe stenosis with near occlusion of the right M1 segment. Occlusion of the left A1 segment. Moderate stenosis of the right P1 P2 junction. 40% stenosis at the right carotid bifurcation. Right vertebral artery occluded at its origin. Reconstituted flow and retrograde flow in the distal right vertebral artery. 50% proximal basilar stenosis. No CBF less than 30% identified by this study. Possible reperfusion of the completed infarctions of the deep brain on the right shown by MRI. Small focus of T-max greater than 6 seconds in the right parietal region. 110 cc region of T-max greater than 4 seconds throughout the right middle cerebral artery distribution, indicating at risk brain secondary to the near occlusion of the right MCA. These results were called by telephone at the time of interpretation on 02/05/2019  at 3:47 pm to provider Alexis Goodell , who verbally acknowledged these results. Electronically Signed   By: Nelson Chimes M.D.   On: 02/05/2019 15:55   Mr Angio Head Wo Contrast  Result Date: 02/05/2019 CLINICAL DATA:  Stroke.  Left-sided weakness. EXAM: MRI HEAD WITHOUT  CONTRAST MRA HEAD WITHOUT CONTRAST MRA NECK WITHOUT AND WITH CONTRAST TECHNIQUE: Multiplanar, multiecho pulse sequences of the brain and surrounding structures were obtained without intravenous  contrast. Angiographic images of the Circle of Willis were obtained using MRA technique without intravenous contrast. Angiographic images of the neck were obtained using MRA technique without and with intravenous contrast. Carotid stenosis measurements (when applicable) are obtained utilizing NASCET criteria, using the distal internal carotid diameter as the denominator. CONTRAST:  23mL GADAVIST GADOBUTROL 1 MMOL/ML IV SOLN COMPARISON:  CT head 02/05/2019.  MRI 09/07/2013 FINDINGS: MRI HEAD FINDINGS Brain: Acute infarct in the deep white matter on the right with scattered small areas of acute infarct. Additional small areas of acute infarct in the left parietal periventricular white matter and left parietal cortex. Small acute infarct left lateral posterior temporal lobe. Generalized atrophy with ventricular enlargement similar to 2015. Chronic microvascular ischemic changes in the white matter. Small chronic infarct right cerebellum. Retro cerebellar cyst unchanged. Negative for hemorrhage or mass. Vascular: Normal arterial flow voids. Skull and upper cervical spine: Negative Sinuses/Orbits: Negative Other: None MRA HEAD FINDINGS Left vertebral artery supplies the basilar and is widely patent. Distal right vertebral artery occluded. Mild stenosis proximal basilar. Main basilar is patent. Moderate to severe stenosis right P1 segment. Severe stenosis right P2 segment. Moderate stenosis left P1 segment. Mild atherosclerotic irregularity and mild stenosis in the internal carotid artery bilaterally. Left A1 segment occluded. Both anterior cerebral arteries patent and supplied from the right Severe stenosis right M1 segment with distal flow present. Mild to moderate stenosis proximal left M1. Left middle cerebral artery branches widely patent. Negative for cerebral aneurysm. MRA NECK FINDINGS Normal aortic arch.  Proximal great vessels patent. Irregularity of the proximal right common carotid artery most likely artifact  from breathing motion although possibly atherosclerotic stenosis. Carotid bifurcation widely patent bilaterally. Mild to moderate stenosis left internal carotid artery a at the petrous segment. Left vertebral artery widely patent to the basilar without stenosis Occlusion of the proximal and mid right vertebral artery with reconstitution distally. Severely disease distal right vertebral artery which may have minimal contribution to the basilar. IMPRESSION: 1. Acute infarct deep white matter on the right in a watershed territory, likely due to severe stenosis right M1 segment. 2. Small acute infarct left lateral posterior temporal lobe 3. Carotid bifurcation widely patent in the neck. Mild to moderate stenosis of the left petrous carotid. 4. Left vertebral artery widely patent. Right vertebral artery severely diseased with long segment occlusion and partial reconstitution distally. 5. Moderate to severe stenosis right P1. Severe stenosis right P2 segment. Moderate stenosis left P1 segment. 6. Occluded left A1 segment. Severe stenosis right M1 segment. Mild to moderate stenosis left M1. Electronically Signed   By: Franchot Gallo M.D.   On: 02/05/2019 13:06   Mr Angio Neck W Wo Contrast  Result Date: 02/05/2019 CLINICAL DATA:  Stroke.  Left-sided weakness. EXAM: MRI HEAD WITHOUT  CONTRAST MRA HEAD WITHOUT CONTRAST MRA NECK WITHOUT AND WITH CONTRAST TECHNIQUE: Multiplanar, multiecho pulse sequences of the brain and surrounding structures were obtained without intravenous contrast. Angiographic images of the Circle of Willis were obtained using MRA technique without intravenous contrast. Angiographic images of the  neck were obtained using MRA technique without and with intravenous contrast. Carotid stenosis measurements (when applicable) are obtained utilizing NASCET criteria, using the distal internal carotid diameter as the denominator. CONTRAST:  12mL GADAVIST GADOBUTROL 1 MMOL/ML IV SOLN COMPARISON:  CT head  02/05/2019.  MRI 09/07/2013 FINDINGS: MRI HEAD FINDINGS Brain: Acute infarct in the deep white matter on the right with scattered small areas of acute infarct. Additional small areas of acute infarct in the left parietal periventricular white matter and left parietal cortex. Small acute infarct left lateral posterior temporal lobe. Generalized atrophy with ventricular enlargement similar to 2015. Chronic microvascular ischemic changes in the white matter. Small chronic infarct right cerebellum. Retro cerebellar cyst unchanged. Negative for hemorrhage or mass. Vascular: Normal arterial flow voids. Skull and upper cervical spine: Negative Sinuses/Orbits: Negative Other: None MRA HEAD FINDINGS Left vertebral artery supplies the basilar and is widely patent. Distal right vertebral artery occluded. Mild stenosis proximal basilar. Main basilar is patent. Moderate to severe stenosis right P1 segment. Severe stenosis right P2 segment. Moderate stenosis left P1 segment. Mild atherosclerotic irregularity and mild stenosis in the internal carotid artery bilaterally. Left A1 segment occluded. Both anterior cerebral arteries patent and supplied from the right Severe stenosis right M1 segment with distal flow present. Mild to moderate stenosis proximal left M1. Left middle cerebral artery branches widely patent. Negative for cerebral aneurysm. MRA NECK FINDINGS Normal aortic arch.  Proximal great vessels patent. Irregularity of the proximal right common carotid artery most likely artifact from breathing motion although possibly atherosclerotic stenosis. Carotid bifurcation widely patent bilaterally. Mild to moderate stenosis left internal carotid artery a at the petrous segment. Left vertebral artery widely patent to the basilar without stenosis Occlusion of the proximal and mid right vertebral artery with reconstitution distally. Severely disease distal right vertebral artery which may have minimal contribution to the basilar.  IMPRESSION: 1. Acute infarct deep white matter on the right in a watershed territory, likely due to severe stenosis right M1 segment. 2. Small acute infarct left lateral posterior temporal lobe 3. Carotid bifurcation widely patent in the neck. Mild to moderate stenosis of the left petrous carotid. 4. Left vertebral artery widely patent. Right vertebral artery severely diseased with long segment occlusion and partial reconstitution distally. 5. Moderate to severe stenosis right P1. Severe stenosis right P2 segment. Moderate stenosis left P1 segment. 6. Occluded left A1 segment. Severe stenosis right M1 segment. Mild to moderate stenosis left M1. Electronically Signed   By: Franchot Gallo M.D.   On: 02/05/2019 13:06   Mr Brain Wo Contrast  Result Date: 02/05/2019 CLINICAL DATA:  Stroke.  Left-sided weakness. EXAM: MRI HEAD WITHOUT  CONTRAST MRA HEAD WITHOUT CONTRAST MRA NECK WITHOUT AND WITH CONTRAST TECHNIQUE: Multiplanar, multiecho pulse sequences of the brain and surrounding structures were obtained without intravenous contrast. Angiographic images of the Circle of Willis were obtained using MRA technique without intravenous contrast. Angiographic images of the neck were obtained using MRA technique without and with intravenous contrast. Carotid stenosis measurements (when applicable) are obtained utilizing NASCET criteria, using the distal internal carotid diameter as the denominator. CONTRAST:  83mL GADAVIST GADOBUTROL 1 MMOL/ML IV SOLN COMPARISON:  CT head 02/05/2019.  MRI 09/07/2013 FINDINGS: MRI HEAD FINDINGS Brain: Acute infarct in the deep white matter on the right with scattered small areas of acute infarct. Additional small areas of acute infarct in the left parietal periventricular white matter and left parietal cortex. Small acute infarct left lateral posterior temporal lobe. Generalized atrophy with ventricular  enlargement similar to 2015. Chronic microvascular ischemic changes in the white matter.  Small chronic infarct right cerebellum. Retro cerebellar cyst unchanged. Negative for hemorrhage or mass. Vascular: Normal arterial flow voids. Skull and upper cervical spine: Negative Sinuses/Orbits: Negative Other: None MRA HEAD FINDINGS Left vertebral artery supplies the basilar and is widely patent. Distal right vertebral artery occluded. Mild stenosis proximal basilar. Main basilar is patent. Moderate to severe stenosis right P1 segment. Severe stenosis right P2 segment. Moderate stenosis left P1 segment. Mild atherosclerotic irregularity and mild stenosis in the internal carotid artery bilaterally. Left A1 segment occluded. Both anterior cerebral arteries patent and supplied from the right Severe stenosis right M1 segment with distal flow present. Mild to moderate stenosis proximal left M1. Left middle cerebral artery branches widely patent. Negative for cerebral aneurysm. MRA NECK FINDINGS Normal aortic arch.  Proximal great vessels patent. Irregularity of the proximal right common carotid artery most likely artifact from breathing motion although possibly atherosclerotic stenosis. Carotid bifurcation widely patent bilaterally. Mild to moderate stenosis left internal carotid artery a at the petrous segment. Left vertebral artery widely patent to the basilar without stenosis Occlusion of the proximal and mid right vertebral artery with reconstitution distally. Severely disease distal right vertebral artery which may have minimal contribution to the basilar. IMPRESSION: 1. Acute infarct deep white matter on the right in a watershed territory, likely due to severe stenosis right M1 segment. 2. Small acute infarct left lateral posterior temporal lobe 3. Carotid bifurcation widely patent in the neck. Mild to moderate stenosis of the left petrous carotid. 4. Left vertebral artery widely patent. Right vertebral artery severely diseased with long segment occlusion and partial reconstitution distally. 5. Moderate to  severe stenosis right P1. Severe stenosis right P2 segment. Moderate stenosis left P1 segment. 6. Occluded left A1 segment. Severe stenosis right M1 segment. Mild to moderate stenosis left M1. Electronically Signed   By: Franchot Gallo M.D.   On: 02/05/2019 13:06   Ct Cerebral Perfusion W Contrast  Result Date: 02/05/2019 CLINICAL DATA:  Stroke. Left-sided weakness. Right-sided watershed infarction by MRI. Small left temporal stroke. EXAM: CT ANGIOGRAPHY HEAD AND NECK CT PERFUSION BRAIN TECHNIQUE: Multidetector CT imaging of the head and neck was performed using the standard protocol during bolus administration of intravenous contrast. Multiplanar CT image reconstructions and MIPs were obtained to evaluate the vascular anatomy. Carotid stenosis measurements (when applicable) are obtained utilizing NASCET criteria, using the distal internal carotid diameter as the denominator. Multiphase CT imaging of the brain was performed following IV bolus contrast injection. Subsequent parametric perfusion maps were calculated using RAPID software. CONTRAST:  139mL OMNIPAQUE IOHEXOL 350 MG/ML SOLN COMPARISON:  MRI studies same day FINDINGS: CTA NECK FINDINGS Aortic arch: Aortic atherosclerosis. Branching pattern shows atherosclerotic disease. There is motion degradation. I do not suspect a flow limiting origin stenosis. Left vertebral arises directly from the arch. Right carotid system: Common carotid artery widely patent to the bifurcation. Calcified plaque at the carotid bifurcation and proximal ICA bulb. Minimal diameter is 3 mm. Compared to a more distal cervical ICA diameter of 5 mm, this indicates a 40% stenosis. Left carotid system: Common carotid artery is widely patent to the bifurcation region. Calcified plaque at the carotid bifurcation. No stenosis. Vertebral arteries: Right vertebral artery is occluded at the origin. Left vertebral artery originates from the arch. The origin cannot be accurately evaluated. The  left vertebral artery is patent through the cervical region to the foramen magnum. There is reconstitution of the right  vertebral artery at C1. Skeleton: Ordinary spondylosis. Other neck: No mass or lymphadenopathy. Upper chest: Negative Review of the MIP images confirms the above findings CTA HEAD FINDINGS Anterior circulation: Both internal carotid arteries are patent through the skull base and siphon regions. There is atherosclerotic calcification in both carotid siphon regions without stenosis greater than 30%. The right A1 segment appears widely patent. There is severe stenosis in the right M1 segment with near occlusion. Distal vessels do show good opacification. Both anterior cerebral arteries opacified normally, thanks to the presence of a patent anterior communicating artery. The left A1 segment is occluded. The left M1 segment is widely patent. Left MCA branch vessels show flow. Stenosis at the origin of the left temporal branch. Posterior circulation: Left vertebral artery is widely patent through the foramen magnum to the basilar. There is retrograde and or reconstituted flow in the distal right vertebral artery. Multiple stenoses are present in the right V4 segment. There is moderate stenosis of the proximal basilar artery, 50%. More distal basilar artery is widely patent. Superior cerebellar and posterior cerebral arteries are patent. There is moderate stenosis at the right P1 P2 junction. Venous sinuses: Patent and normal. Anatomic variants: None other significant. Review of the MIP images confirms the above findings CT Brain Perfusion Findings: ASPECTS: 10 CBF (<30%) Volume: 71mL Perfusion (Tmax>6.0s) volume: 3 ccmL. There is a 110 cc region of T-max greater than 4 seconds in the right middle cerebral artery territory, secondary to the severe M1 stenosis. Mismatch Volume: 88mL Infarction Location:No infarction identified. At risk brain in the posterior parietal region on the right measuring 3 cc. If 1  considers the T-max greater than 4 second brain to be at risk, there is considerable at risk brain throughout the right MCA territory. IMPRESSION: CT angiography confirms the MR findings. Severe stenosis with near occlusion of the right M1 segment. Occlusion of the left A1 segment. Moderate stenosis of the right P1 P2 junction. 40% stenosis at the right carotid bifurcation. Right vertebral artery occluded at its origin. Reconstituted flow and retrograde flow in the distal right vertebral artery. 50% proximal basilar stenosis. No CBF less than 30% identified by this study. Possible reperfusion of the completed infarctions of the deep brain on the right shown by MRI. Small focus of T-max greater than 6 seconds in the right parietal region. 110 cc region of T-max greater than 4 seconds throughout the right middle cerebral artery distribution, indicating at risk brain secondary to the near occlusion of the right MCA. These results were called by telephone at the time of interpretation on 02/05/2019 at 3:47 pm to provider [facility] , who verbally acknowledged these results. Electronically Signed   By: Nelson Chimes M.D.   On: 02/05/2019 15:55   Dg Chest Port 1 View  Result Date: 02/04/2019 CLINICAL DATA:  Acute respiratory failure. EXAM: PORTABLE CHEST 1 VIEW COMPARISON:  02/03/2019 and earlier exams. FINDINGS: Vascular congestion and interstitial thickening is stable from the previous day's study. No lung consolidation. No convincing pleural effusion and no pneumothorax. Mild streaky basilar atelectasis. Cardiac silhouette normal in size. IMPRESSION: 1. Stable exam from the previous day's study. 2. Vascular congestion with interstitial prominence. No evidence of pneumonia. Electronically Signed   By: Lajean Manes M.D.   On: 02/04/2019 09:17   Dg Chest Portable 1 View  Result Date: 02/03/2019 CLINICAL DATA:  Shortness of breath EXAM: PORTABLE CHEST 1 VIEW COMPARISON:  10/13/2018 FINDINGS: The heart size and  mediastinal contours are within normal  limits. Calcific aortic. Mild pulmonary vascular congestion. Streaky bibasilar opacities. No pleural effusion or pneumothorax. IMPRESSION: 1. Mild pulmonary vascular congestion. 2. Streaky bibasilar opacities, favor atelectasis. Electronically Signed   By: Davina Poke M.D.   On: 02/03/2019 10:34    Labs:  CBC: Recent Labs    02/03/19 1510 02/04/19 0401 02/05/19 0547 02/06/19 0252  WBC 21.6* 14.5* 9.4 11.5*  HGB 13.4 11.9* 11.5* 12.7  HCT 42.3 37.4 37.1 39.0  PLT 270 243 234 282    COAGS: No results for input(s): INR, APTT in the last 8760 hours.  BMP: Recent Labs    02/03/19 0942 02/03/19 1510 02/04/19 0401 02/05/19 0547 02/06/19 0252  NA 143  --  145 140 139  K 3.5  --  3.0* 3.3* 4.1  CL 101  --  107 104 105  CO2 28  --  26 26 23   GLUCOSE 122*  --  106* 103* 163*  BUN 23  --  23 30* 21  CALCIUM 9.2  --  8.3* 8.4* 8.8*  CREATININE 0.92 0.86 1.00 0.88 0.98  GFRNONAA 58* >60 52* >60 53*  GFRAA >60 >60 >60 >60 >60    LIVER FUNCTION TESTS: Recent Labs    02/11/18 2006 11/25/18 1046  BILITOT 0.6 0.2  AST 21 14  ALT 13 12  ALKPHOS 85 90  PROT 7.4 6.5  ALBUMIN 3.8 3.9    Assessment and Plan:  Right MCA M1 segment pre-occlusive stenosis s/p emergent endovascular revascularization using balloon angioplasty x3 02/05/2019 by Dr. Estanislado Pandy. Patient's condition improving- can spontaneously move right side, can lift LLE off bed but cannot hold up, and can give thumbs up/hand grip of LUE but cannot lift. Right groin incision c/d/i, distal pulses palpable bilaterally with Doppler. Continue taking Brilinta 90 mg twice daily and Aspirin 81 mg once daily. Plan to follow-up with Dr. Estanislado Pandy in clinic 4 weeks after discharge. Plan for MR/MRA brain/head (without contrast) today per Dr. Estanislado Pandy- order placed. Further plans per neurology- appreciate and agree with management. NIR to follow.   Electronically Signed: Earley Abide, PA-C 02/06/2019, 9:44 AM   I spent a total of 25 Minutes at the the patient's bedside AND on the patient's hospital floor or unit, greater than 50% of which was counseling/coordinating care for right MCA M1 segment pre-occlusive stenosis s/p revascularization.

## 2019-02-06 NOTE — Plan of Care (Signed)
Pt verbalizes positive feelings about self

## 2019-02-06 NOTE — Progress Notes (Signed)
PT Cancellation Note  Patient Details Name: Christy Carter MRN: [id number] DOB: [birth year]   Cancelled Treatment:    Reason Eval/Treat Not Completed: Patient at procedure or test/unavailable.  Will re-attempt mobility evaluation when pt available, follow up next date if necessary.     Kearney Hard [facility] 02/06/2019, 11:46 AM

## 2019-02-06 NOTE — Progress Notes (Signed)
OT Cancellation Note  Patient Details Name: Christy Carter MRN: [id number] DOB: 16-Feb-1934   Cancelled Treatment:    Reason Eval/Treat Not Completed: Patient at procedure or test/ unavailable(Will return as schedule as schedule allows.)  Lequita Halt, OT Student  02/06/2019, 12:53 PM

## 2019-02-06 NOTE — Progress Notes (Signed)
Patient has max on cleviprex and blood pressure still within goal, No PRN, MD made aware. Awaiting new orders

## 2019-02-06 NOTE — Progress Notes (Signed)
Modified Barium Swallow Progress Note  Patient Details  Name: Christy Carter MRN: [id number] Date of Birth: 03-17-1934  Today's Date: 02/06/2019  Modified Barium Swallow completed.  Full report located under Chart Review in the Imaging Section.  Brief recommendations include the following:  Clinical Impression  Pt presents with mild oropharyngeal dysphagia c/b decreased labial seal, reduced lingual strength and coordination, premature spillage, delayed swallow initiation, reduced base of tongue retraction, incomplete laryngeal closure, and diminished sensation.  These deficits resulted in piecemeal deglution, BOT and vallecula residue with solids, and penetration of thin and nectar thick liquids.  Penetration occured before swallow 2/2 delayed swallow initiation.  Not all penetration was cleared during swallow.  With pill simulation, penetration of thin liquid increased with penetration reaching the vocal folds.  There was a reflexive cough with this episode.  Penetration appeared to clear; however, pt's positioning and body habitus precluded full and complete visualization of laryngeal vestibule and trachea throughout study.  No aspiration was observed.  Penetration above the vocal folds noted with nectar thick liquid.  There was no penetration honey thick liquid, puree or solid consistencies. With solid textures there was significant oral residue with spillover of residuals to vallecula without initiation of cleansing swallow.  Oral residue noted on L side during clinical evaluation. Recommend initiation of puree diet with honey thick liquid.  This is a consevative diet recommendation based on incomplete visualization of larynx and trachea during study and pt's inconsistent cough reflex.  Consider FEES for further instrumental evaluation if needed.   Swallow Evaluation Recommendations       SLP Diet Recommendations: Dysphagia 1 (Puree) solids;Honey thick liquids   Liquid Administration via:  Cup;No straw   Medication Administration: Whole meds with puree   Supervision: Staff to assist with self feeding   Compensations: Slow rate;Small sips/bites   Postural Changes: Seated upright at 90 degrees   Oral Care Recommendations: Oral care BID   Other Recommendations: Order thickener from [place], [place], [place] Office: [phone] 02/06/2019,2:05 PM

## 2019-02-06 NOTE — Evaluation (Signed)
Clinical/Bedside Swallow Evaluation Patient Details  Name: Christy Carter MRN: [id number] Date of Birth: 08-05-1933  Today's Date: 02/06/2019 Time: SLP Start Time (ACUTE ONLY): 0914 SLP Stop Time (ACUTE ONLY): 0927 SLP Time Calculation (min) (ACUTE ONLY): 13 min  Past Medical History:  Past Medical History:  Diagnosis Date  . Arthritis   . Carpal tunnel syndrome   . Complication of anesthesia    Difficulty waking up. Believes it took 2 days to wake up after her surgery.  . Depression   . Hyperlipidemia   . Hypertension   . Shortness of breath   . Stroke ([place])   . Thyroid disease    Past Surgical History:  Past Surgical History:  Procedure Laterality Date  . APPENDECTOMY    . BACK SURGERY    . CHOLECYSTECTOMY    . HERNIA REPAIR     HPI:  Christy Carter is a 85 y.o. female PMH of sleep apnea, HTN, HLD, carpal tunnel, stroke who presented to [facility] on 9/24 for SOB. She was admitted by the medical services there, noted to have a pneumonia/UTI. She was noted to have some left sided weakness on initial exam, and again today, with LKW documented to be 0200 02/05/2019. Further imaging was done in the form of MRI of the brain after a CT showed no acute changes.  The MRI brain showed bilateral small acute infarct with the largest being in the deep white matter watershed territory on the right. The neurologist over at [facility] regional discussed with the family who wanted aggressive care and she was transferred directly to the IR suite at [facility].  CXR 9/24: vascular congestion; "Streaky bibasilar opacities, favor atelectasis".  MRI 9/26: "IMPRESSION: 1. Acute infarct deep white matter on the right in a watershed territory, likely due to severe stenosis right M1 segment. 2. Small acute infarct left lateral posterior temporal lobe 3. Carotid bifurcation widely patent in the neck. Mild to moderate stenosis of the left petrous carotid. 4. Left vertebral artery widely patent. Right vertebral  artery severely diseased with long segment occlusion and partial reconstitution distally. 5. Moderate to severe stenosis right P1. Severe stenosis right P2 segment. Moderate stenosis left P1 segment. 6. Occluded left A1 segment. Severe stenosis right M1 segment. Mild to moderate stenosis left M1."   Assessment / Plan / Recommendation Clinical Impression  Pt presents with clinical s/s of pharyngeal dysphagia, and mild oral dysphagia c/b reduced lingual strength and ROM which resulted in oral residue L>R.  With thin liquid pt required multiple swallows and there was intermittent coughing and throat clearing.  Clinical s/s of aspiration were not reduced with nectar thick liquid.  Recommend instrumental evaulation prior to initiation of PO diet.  MBSS is scheduled for later this date. SLP Visit Diagnosis: Dysphagia, oropharyngeal phase (R13.12)    Aspiration Risk    Moderate   Diet Recommendation NPO        Other  Recommendations     Follow up Recommendations   Pending results of MBSS     Frequency and Duration   Pending results of MBSS         Prognosis Prognosis for Safe Diet Advancement: Good      Swallow Study   General HPI: Christy Carter is a 85 y.o. female PMH of sleep apnea, HTN, HLD, carpal tunnel, stroke who presented to [facility] on 9/24 for SOB. She was admitted by the medical services there, noted to have a pneumonia/UTI. She was noted to have  some left sided weakness on initial exam, and again today, with LKW documented to be 0200 02/05/2019. Further imaging was done in the form of MRI of the brain after a CT showed no acute changes.  The MRI brain showed bilateral small acute infarct with the largest being in the deep white matter watershed territory on the right. The neurologist over at [facility] regional discussed with the family who wanted aggressive care and she was transferred directly to the IR suite at [facility].  CXR 9/24: vascular congestion; "Streaky bibasilar  opacities, favor atelectasis".  MRI 9/26: "IMPRESSION: 1. Acute infarct deep white matter on the right in a watershed territory, likely due to severe stenosis right M1 segment. 2. Small acute infarct left lateral posterior temporal lobe 3. Carotid bifurcation widely patent in the neck. Mild to moderate stenosis of the left petrous carotid. 4. Left vertebral artery widely patent. Right vertebral artery severely diseased with long segment occlusion and partial reconstitution distally. 5. Moderate to severe stenosis right P1. Severe stenosis right P2 segment. Moderate stenosis left P1 segment. 6. Occluded left A1 segment. Severe stenosis right M1 segment. Mild to moderate stenosis left M1." Type of Study: Bedside Swallow Evaluation Previous Swallow Assessment: None Diet Prior to this Study: NPO Temperature Spikes Noted: No History of Recent Intubation: Yes Length of Intubations (days): (for procedure) Behavior/Cognition: Alert;Cooperative;Pleasant mood Oral Cavity Assessment: Within Functional Limits Oral Cavity - Dentition: Edentulous;Missing dentition;Poor condition Self-Feeding Abilities: Needs assist Patient Positioning: Upright in bed Baseline Vocal Quality: Normal    Oral/Motor/Sensory Function Overall Oral Motor/Sensory Function: Mild impairment Facial ROM: Reduced left Facial Symmetry: Abnormal symmetry left Lingual ROM: Reduced left Lingual Symmetry: Abnormal symmetry left Lingual Strength: Reduced Velum: Within Functional Limits Mandible: Within Functional Limits   Ice Chips Ice chips: Not tested   Thin Liquid Thin Liquid: Impaired Presentation: Straw;Cup Oral Phase Impairments: Reduced labial seal Oral Phase Functional Implications: Right anterior spillage;Left anterior spillage Pharyngeal  Phase Impairments: Multiple swallows;Throat Clearing - Immediate;Throat Clearing - Delayed;Cough - Immediate    Nectar Thick Nectar Thick Liquid: Impaired Presentation: Straw Pharyngeal  Phase Impairments: Throat Clearing - Delayed   Honey Thick Honey Thick Liquid: Not tested   Puree Puree: Within functional limits Presentation: Spoon   Solid     Solid: Impaired Presentation: Spoon(SLP fed) Oral Phase Impairments: Impaired mastication Oral Phase Functional Implications: Oral residue Pharyngeal Phase Impairments: Cough - Delayed      Celedonio Savage, MA, [place] Office: [phone] 02/06/2019,10:04 AM

## 2019-02-07 ENCOUNTER — Inpatient Hospital Stay (HOSPITAL_COMMUNITY): Payer: Medicare Other

## 2019-02-07 ENCOUNTER — Encounter (HOSPITAL_COMMUNITY): Payer: Self-pay | Admitting: Radiology

## 2019-02-07 LAB — BASIC METABOLIC PANEL
Anion gap: 9 (ref 5–15)
BUN: 21 mg/dL (ref 8–23)
CO2: 22 mmol/L (ref 22–32)
Calcium: 8.6 mg/dL — ABNORMAL LOW (ref 8.9–10.3)
Chloride: 110 mmol/L (ref 98–111)
Creatinine, Ser: 0.93 mg/dL (ref 0.44–1.00)
GFR calc Af Amer: >60 mL/min (ref >60)
GFR calc non Af Amer: 57 mL/min — ABNORMAL LOW (ref >60)
Glucose, Bld: 109 mg/dL — ABNORMAL HIGH (ref 70–99)
Potassium: 4.5 mmol/L (ref 3.5–5.1)
Sodium: 141 mmol/L (ref 135–145)

## 2019-02-07 LAB — TRIGLYCERIDES: Triglycerides: 249 mg/dL — ABNORMAL HIGH (ref <150)

## 2019-02-07 LAB — GLUCOSE, CAPILLARY: Glucose-Capillary: 82 mg/dL (ref 70–99)

## 2019-02-07 MED ORDER — HALOPERIDOL LACTATE 5 MG/ML IJ SOLN
1.0000 mg | Freq: Once | INTRAMUSCULAR | Status: AC
  Administered 2019-02-07: 1 mg via INTRAMUSCULAR
  Filled 2019-02-07: qty 1

## 2019-02-07 MED ORDER — QUETIAPINE FUMARATE 25 MG PO TABS
25.0000 mg | ORAL_TABLET | Freq: Every day | ORAL | Status: DC
  Administered 2019-02-07: 20:00:00 25 mg via ORAL
  Filled 2019-02-07: qty 1

## 2019-02-07 MED ORDER — SENNOSIDES-DOCUSATE SODIUM 8.6-50 MG PO TABS
1.0000 | ORAL_TABLET | Freq: Every day | ORAL | Status: DC
  Administered 2019-02-09 – 2019-02-10 (×2): 1 via ORAL
  Filled 2019-02-07 (×4): qty 1

## 2019-02-07 MED ORDER — IPRATROPIUM-ALBUTEROL 0.5-2.5 (3) MG/3ML IN SOLN
3.0000 mL | Freq: Four times a day (QID) | RESPIRATORY_TRACT | Status: DC | PRN
  Administered 2019-02-07 – 2019-02-09 (×4): 3 mL via RESPIRATORY_TRACT
  Filled 2019-02-07 (×4): qty 3

## 2019-02-07 MED ORDER — LORAZEPAM 2 MG/ML IJ SOLN
1.0000 mg | Freq: Once | INTRAMUSCULAR | Status: AC
  Administered 2019-02-07: 1 mg via INTRAVENOUS
  Filled 2019-02-07: qty 1

## 2019-02-07 MED ORDER — LORAZEPAM 2 MG/ML IJ SOLN: 1.0000 mg | Freq: Once | INTRAMUSCULAR | Status: AC

## 2019-02-07 MED ORDER — HALOPERIDOL LACTATE 5 MG/ML IJ SOLN: 1.0000 mg | INTRAMUSCULAR | Status: DC | PRN

## 2019-02-07 MED ORDER — HALOPERIDOL LACTATE 5 MG/ML IJ SOLN: 1.0000 mg | Freq: Once | INTRAMUSCULAR | Status: DC

## 2019-02-07 MED ORDER — RESOURCE THICKENUP CLEAR PO POWD
ORAL | Status: DC | PRN
  Filled 2019-02-07: qty 125

## 2019-02-07 MED ORDER — CLONIDINE HCL 0.1 MG/24HR TD PTWK
0.1000 mg | MEDICATED_PATCH | TRANSDERMAL | Status: DC
  Administered 2019-02-07: 0.1 mg via TRANSDERMAL
  Filled 2019-02-07: qty 1

## 2019-02-07 MED ORDER — HALOPERIDOL LACTATE 5 MG/ML IJ SOLN
INTRAMUSCULAR | Status: AC
  Administered 2019-02-07: 5 mg
  Filled 2019-02-07: qty 1

## 2019-02-07 MED ORDER — QUETIAPINE FUMARATE 25 MG PO TABS: 25.0000 mg | ORAL_TABLET | Freq: Once | ORAL | Status: AC

## 2019-02-07 NOTE — Evaluation (Signed)
Physical Therapy Evaluation Patient Details Name: Christy Carter MRN: [id number] DOB: 01/03/1934 Today's Date: 02/07/2019   History of Present Illness   Christy Carter is a 85 y.o. female PMH of sleep apnea, HTN, HLD, carpal tunnel, stroke who presented to [facility] on 9/24 for SOB. She was admitted by the medical services there, noted to have a pneumonia/UTI. Pt also found to have L sided weakness. Pt transfered to [facility]. MRI revealed bila small acute infarct with, in deep white matter watershed territory on the R. MRA revealed severe R M1 stensosis, R vertebral occlusion, moderate  to severe R P1 stenosis, severe R P2 stenosis and occluded L A1 in addition to mild/mod L m1 stensosis.  Pt s/p R M1 thrombectomy/anbioplasty.  Clinical Impression  Pt admitted with above. Pt with no eye opening or command follow during PT/OT evaluation. Pt with some voluntary movement of R UE but none with the L UE or Bilat LE. Pt easily agitated and become irritated. This is not patients baseline according to the daughter. Dtr reports pt was indep and never cursed PTA. Pt didn't drive however used a RW and brought in her own fire wood to heat the house and would do her grocery shopping (ride provided by family). Pt currently dependent for all mobility at this time. Recommending ST-SNF upon d/c for maximal functional recovery. Acute PT to cont to follow.    Follow Up Recommendations SNF;Supervision/Assistance - 24 hour    Equipment Recommendations  Other (comment)(TBD at next venue)    Recommendations for Other Services       Precautions / Restrictions Precautions Precautions: Fall Precaution Comments: can be agitated Restrictions Weight Bearing Restrictions: No      Mobility  Bed Mobility Overal bed mobility: Needs Assistance Bed Mobility: Supine to Sit;Sit to Supine     Supine to sit: Max assist;+2 for physical assistance Sit to supine: Max assist;+2 for physical assistance   General bed mobility comments:  pt with no initiation of transfer, dependent for trunk elevation and LE manabement to/from EOB.  Transfers                 General transfer comment: unable this date  Ambulation/Gait             General Gait Details: unable this date  Stairs            Wheelchair Mobility    Modified Rankin (Stroke Patients Only) Modified Rankin (Stroke Patients Only) Pre-Morbid Rankin Score: No significant disability Modified Rankin: Severe disability     Balance Overall balance assessment: Needs assistance;History of Falls Sitting-balance support: Feet supported;Bilateral upper extremity supported Sitting balance-Leahy Scale: Zero Sitting balance - Comments: pt dependent to maintain EOB balance otherwise would fall posteriorly                                     Pertinent Vitals/Pain Pain Assessment: Faces Faces Pain Scale: No hurt    Home Living Family/patient expects to be discharged to:: Private residence Living Arrangements: Alone Available Help at Discharge: Family;Available 24 hours/day Type of Home: House Home Access: Ramped entrance     Home Layout: One level Home Equipment: Walker - 4 wheels;Walker - 2 wheels Additional Comments: dtr and son could provide 24/7    Prior Function Level of Independence: Independent with assistive device(s)         Comments: heats with wood, would bring in her  own wood, doesn't drive but would grocery shop herself via her dtr or son providing her a ride, uses a RW     Hand Dominance   Dominant Hand: Right    Extremity/Trunk Assessment   Upper Extremity Assessment Upper Extremity Assessment: Defer to OT evaluation(pt with noted voluntary mvmt of R UE)    Lower Extremity Assessment Lower Extremity Assessment: RLE deficits/detail;LLE deficits/detail RLE Deficits / Details: pt with no voluntary movement, pt holding LE in extension and resisting ROM, in sitting EOB pt bent knees and was able to  achieve full knee/ankle PROM LLE Deficits / Details: pt fighting ROM in bed, no active movement, able to achieve full PROM to knee/ankle in sitting    Cervical / Trunk Assessment Cervical / Trunk Assessment: Normal  Communication   Communication: No difficulties  Cognition Arousal/Alertness: Lethargic Behavior During Therapy: Agitated Overall Cognitive Status: Impaired/Different from baseline Area of Impairment: Orientation;Attention;Following commands;Awareness;Problem solving;Safety/judgement                 Orientation Level: Disoriented to;Place;Time;Situation Current Attention Level: Focused   Following Commands: Follows one step commands inconsistently(no command follow) Safety/Judgement: Decreased awareness of safety;Decreased awareness of deficits Awareness: Intellectual Problem Solving: Slow processing;Decreased initiation;Difficulty sequencing;Requires verbal cues;Requires tactile cues General Comments: spoke with daughter and shes states "she has never cused at anybody. She is a church going lady. Pt was indep PTA and even brought in her own wood, as she heats with wood. "       General Comments General comments (skin integrity, edema, etc.): RR into 33s from 20s, SpO2 in low 80s on RA, in 90s on 4LO2 via [place], BP map 112, RN made aware of vitals    Exercises     Assessment/Plan    PT Assessment Patient needs continued PT services  PT Problem List Decreased strength;Decreased activity tolerance;Decreased balance;Decreased mobility;Decreased coordination;Pain       PT Treatment Interventions DME instruction;Gait training;Stair training;Therapeutic activities;Therapeutic exercise;Balance training;Patient/family education    PT Goals (Current goals can be found in the Care Plan section)  Acute Rehab PT Goals PT Goal Formulation: Patient unable to participate in goal setting Time For Goal Achievement: 02/14/19 Potential to Achieve Goals: Fair    Frequency Min  3X/week   Barriers to discharge        Co-evaluation PT/OT/SLP Co-Evaluation/Treatment: Yes Reason for Co-Treatment: Complexity of the patient's impairments (multi-system involvement) PT goals addressed during session: Mobility/safety with mobility         AM-PAC PT "6 Clicks" Mobility  Outcome Measure Help needed turning from your back to your side while in a flat bed without using bedrails?: Total Help needed moving from lying on your back to sitting on the side of a flat bed without using bedrails?: Total Help needed moving to and from a bed to a chair (including a wheelchair)?: Total Help needed standing up from a chair using your arms (e.g., wheelchair or bedside chair)?: Total Help needed to walk in hospital room?: Total Help needed climbing 3-5 steps with a railing? : Total 6 Click Score: 6    End of Session   Activity Tolerance: Patient limited by fatigue Patient left: in bed;with call bell/phone within reach;with chair alarm set Nurse Communication: Mobility status PT Visit Diagnosis: History of falling (Z91.81);Repeated falls (R29.6);Muscle weakness (generalized) (M62.81);Pain    Time: [id number] PT Time Calculation (min) (ACUTE ONLY): 14 min   Charges:   PT Evaluation $PT Eval Moderate Complexity: 1 Mod  Kittie Plater, PT, DPT Acute Rehabilitation Services Pager #: [phone] Office #: [phone]   Berline Lopes 02/07/2019, 9:27 AM

## 2019-02-07 NOTE — Progress Notes (Signed)
STROKE TEAM PROGRESS NOTE   INTERVAL HISTORY Patient has just returned from an MRI where she was agitated and required sedation with Haldol and Ativan and is quite drowsy and hence exam is limited.  Patient's daughter is present at the bedside.  MRI was quite suboptimal due to excessive motion artifacts but does show right periventricular subcortical infarct.  There may be questionable additional small infarcts in the right temporal occipital lobe and right middle cerebellar peduncle as well.  MRA of the brain shows high-grade stenosis of right MCA in the M1 segment which has persisted despite angioplasty  OBJECTIVE Vitals:   02/07/19 0800 02/07/19 0900 02/07/19 1000 02/07/19 1100  BP: 136/64 (!) 142/57 109/68 (!) 115/46  Pulse: 100 94  79  Resp: (!) 30 (!) 21  18  Temp: (!) 97.5 F (36.4 C)     TempSrc: Axillary     SpO2: 94% 100%  100%  Weight:      Height:       CBC:  Recent Labs  Lab 02/05/19 0547 02/06/19 0252  WBC 9.4 11.5*  NEUTROABS 6.7 10.6*  HGB 11.5* 12.7  HCT 37.1 39.0  MCV 93.0 92.6  PLT 234 [id number]   Basic Metabolic Panel:  Recent Labs  Lab 02/04/19 0401  02/06/19 0252 02/07/19 0507  NA 145   < > 139 141  K 3.0*   < > 4.1 4.5  CL 107   < > 105 110  CO2 26   < > 23 22  GLUCOSE 106*   < > 163* 109*  BUN 23   < > 21 21  CREATININE 1.00   < > 0.98 0.93  CALCIUM 8.3*   < > 8.8* 8.6*  MG 1.9  --   --   --   PHOS 3.3  --   --   --    < > = values in this interval not displayed.   Lipid Panel:     Component Value Date/Time   CHOL 213 (H) 02/06/2019 0252   CHOL 98 09/07/2013 0358   TRIG 249 (H) 02/07/2019 0507   TRIG 63 09/07/2013 0358   HDL 44 02/06/2019 0252   HDL 43 09/07/2013 0358   CHOLHDL 4.8 02/06/2019 0252   VLDL 30 02/06/2019 0252   VLDL 13 09/07/2013 0358   LDLCALC 139 (H) 02/06/2019 0252   LDLCALC 42 09/07/2013 0358   HgbA1c:  Lab Results  Component Value Date   HGBA1C 5.6 02/06/2019   IMAGING  Ct Angio Head W Or Wo Contrast Ct Angio  Neck W Or Wo Contrast Ct Cerebral Perfusion W Contrast 02/05/2019 1. CT angiography confirms the MR findings.  2. Severe stenosis with near occlusion of the right M1 segment.  3. Occlusion of the left A1 segment.  4. Moderate stenosis of the right P1 P2 junction. 40% stenosis at the right carotid bifurcation.  5. Right vertebral artery occluded at its origin. Reconstituted flow and retrograde flow in the distal right vertebral artery. 50% proximal basilar stenosis.  6. No CBF less than 30% identified by this study. Possible reperfusion of the completed infarctions of the deep brain on the right shown by MRI.  7. Small focus of T-max greater than 6 seconds in the right parietal region. 110 cc region of T-max greater than 4 seconds throughout the right middle cerebral artery distribution, indicating at risk brain secondary to the near occlusion of the right MCA.   Ct Head Wo Contrast 02/05/2019 No change from yesterday  Moderate atrophy. Microvascular ischemic changes are present a white matter right greater than left, stable from prior study.   Mr Angio Head Wo Contrast Mr Angio Neck W Wo Contrast Mr Brain Wo Contrast 02/05/2019 1. Acute infarct deep white matter on the right in a watershed territory, likely due to severe stenosis right M1 segment.  2. Small acute infarct left lateral posterior temporal lobe  3. Carotid bifurcation widely patent in the neck. Mild to moderate stenosis of the left petrous carotid.  4. Left vertebral artery widely patent. Right vertebral artery severely diseased with long segment occlusion and partial reconstitution distally.  5. Moderate to severe stenosis right P1. Severe stenosis right P2 segment. Moderate stenosis left P1 segment.  6. Occluded left A1 segment. Severe stenosis right M1 segment. Mild to moderate stenosis left M1.   Interventional Radiology 02/05/2019 S/balloon angioplasty x 3 of pre occlusive symptomatic rt mca m1 SEG TO IMPROVED CALIBER TO  APPROX 70 %.  Transthoracic Echocardiogram  02/03/2019  1. Left ventricular ejection fraction, by visual estimation, is 65 to 70%. The left ventricle has hyperdynamic function. There is no left ventricular hypertrophy.  2. Global right ventricle has normal systolic function.The right ventricular size is normal. No increase in right ventricular wall thickness.  3. Left atrial size was normal.  4. Right atrial size was normal.  5. The mitral valve is normal in structure. Trace mitral valve regurgitation.  6. The tricuspid valve is normal in structure. Tricuspid valve regurgitation is trivial.  7. The aortic valve is normal in structure. Aortic valve regurgitation was not visualized by color flow Doppler.  8. The pulmonic valve was not well visualized. Pulmonic valve regurgitation was not assessed by color flow Doppler.  9. The aortic root was not well visualized. 10. The interatrial septum was not well visualized.  ECG - ST rate 104   BPM. (See cardiology reading for complete details)   PHYSICAL EXAM   Obese elderly Caucasian lady not in distress . Afebrile. Head is nontraumatic. Neck is supple without bruit.    Cardiac exam no murmur or gallop. Lungs are clear to auscultation. Distal pulses are well felt. Neurological Exam Patient is lethargic and sleepy.  She can be barely aroused with sternal rub.  Her speech is dysarthric.  Eyes a slightly disconjugate but she does not follow gaze.  She does not blink to threat on either side.  Fundi not visualized.  Face appears symmetric.  Tongue midline.  She does withdraw right upper and lower extremity briskly and left lower extremity to some extent.  There is minimum withdrawal in the left upper extremity to painful stimuli.  Tone is diminished in the left upper extremity.  Sensation coordination cannot be reliably tested.  Plantars are downgoing.  Gait not tested. ASSESSMENT/PLAN [name] is a 85 y.o. female with history of sleep apnea, HTN,  HLD, carpal tunnel, previous stroke presenting to [facility] with UTI, pneumonia and left sided weakness. She did not receive IV t-PA due to late presentation (>4.5 hours from time of onset). MRI found B small infarcts, largest on the R with severe stenosis throughout. transferred to [facility] for IR for R M1 stenosis. 02/05/2019 s/p angioplasty x 3 with APPROX 70 % flow return.  Stroke: R watershed territory infarct in setting of severe stenosis right M1 segment s/p IR w/ partial revascularization AND possible small left lateral posterior temporal lobe infarct AND R middle cerebellar peduncle in setting of diffuse intracranial stenosis as  likely etiology  Code Stroke CT Head - not ordered  CT head - No change from yesterday Moderate atrophy. Microvascular ischemic changes are present a white matter right greater than left, stable from prior study.   MRI head -  Acute infarct deep white matter on the right in a watershed territory, likely due to severe stenosis right M1 segment. Small acute infarct left lateral posterior temporal lobe   MRA H&N -  Right vertebral artery severely diseased with long segment occlusion and partial reconstitution distally. Moderate to severe stenosis right P1. Severe stenosis right P2 segment. Moderate stenosis left P1 segment. Occluded left A1 segment. Severe stenosis right M1 segment. Mild to moderate stenosis left M1.   CTA H&N - Severe stenosis with near occlusion of the right M1 segment. Occlusion of the left A1 segment. Moderate stenosis of the right P1 P2 junction. 40% stenosis at the right carotid bifurcation. Right vertebral artery occluded at its origin. Reconstituted flow and retrograde flow in the distal right vertebral artery. 50% proximal basilar stenosis.    CT Perfusion - No CBF less than 30% identified by this study. Possible reperfusion of the completed infarctions of the deep brain on the right shown by MRI. Small focus of T-max greater  than 6 seconds in the right parietal region. 110 cc region of T-max greater than 4 seconds throughout the right middle cerebral artery distribution, indicating at risk brain secondary to the near occlusion of the right MCA.   Repeat MRI 9/28 new punctate R temporal occipital lobe infarct. Subtle R middle cerebellar peduncle abnormality possible stroke. R deep white matter infarct. Small vessel disease. Atrophy. Old R cerebellar lacunes.   Repeat MRA 9/28 persistent R M1 focal stenosis. Occlusion L A1. R VA w/ retrograde flow. Moderate B PCA P1-P2 jxn stenoses  Carotid Doppler - CTA neck performed - carotid dopplers not indicated.  2D Echo - EF 65 - 70%. No cardiac source of emboli identified.   Sars Corona Virus 2 - negative  LDL - 139  HgbA1c - 5.6  VTE prophylaxis - [place] Heparin  aspirin 325 mg daily prior to admission, now on aspirin 81 mg daily and Brilinta  Therapy recommendations:  SNF  Disposition:  Pending  Hypertension  Home BP meds: Ziac / Maxzide  Lost IV access this am  BP not at designated goal of 140. Increase SBP goal to < 180   Add clonidine 0.1 patch.  On bisoprolol 2.5, triamterene-HCTZ 37.5-25  . Long-term BP goal normotensive  Hyperlipidemia  Home Lipid lowering medication:  none  LDL 139, goal < 70  On Lipitor 40 mg daily   Continue statin at discharge  Other Stroke Risk Factors  Advanced age  Former cigarette smoker - quit  Obesity, recommend weight loss, diet and exercise as appropriate   Hx stroke/TIA  Obstructive sleep apnea  Dysphagia . Secondary to stroke . Cleared for D1 honey thick liquids . Speech on board   Other Active Problems  UTI - UCx 9/24 multiple species. Rocephin 9/26>>9/28 (3 doses)  Mild Leukocytosis - 11.5 (temp - 99 )  Hypokalemia, resolved- 4.5  Audible wheezing during the night. Duoneb given. Added seroquel and gave ativan. CXR increasing hazy opacities lower lungs edema vs interstitial changes.  Possible trace L pleural effusion. Repeat [facility] day # 2  The patient presented with symptomatic right MCA stenosis for which he underwent angioplasty x3 but apparently stenosis seems to have recurred.  MRI was extremely motion degraded despite  patient being sedated.  She would likely need dual antiplatelet therapy aspirin and Brilinta for 3 months followed by single agent alone and aggressive risk factor modification.  I had a long discussion with the patient's daughter at the bedside and discuss her prognosis and plan of care and answered questions.  Plan to check swallow eval when she is more alert and ongoing therapy evaluations.  Continue close neurological monitoring and strict blood pressure control as per post intervention protocol.  She will likely need rehab over the next few days when she improves. This patient is critically ill and at significant risk of neurological worsening, death and care requires constant monitoring of vital signs, hemodynamics,respiratory and cardiac monitoring, extensive review of multiple databases, frequent neurological assessment, discussion with family, other specialists and medical decision making of high complexity.I have made any additions or clarifications directly to the above note.This critical care time does not reflect procedure time, or teaching time or supervisory time of PA/NP/Med Resident etc but could involve care discussion time.  I spent 30 minutes of neurocritical care time  in the care of  this patient.      Antony Contras, MDTo contact Stroke Continuity provider, please refer to [email or web address] After hours, contact General Neurology

## 2019-02-07 NOTE — Progress Notes (Signed)
Pt is confused and extremely agitated. Dr. Rory Percy notified and orders received for Haldol. Sp02 currently in the 80s and pt is refusing to wear her oxygen. MD aware.

## 2019-02-07 NOTE — Evaluation (Signed)
Occupational Therapy Evaluation Patient Details Name: Christy Carter MRN: [id number] DOB: March 19, 1934 Today's Date: 02/07/2019    History of Present Illness  Christy Carter is a 85 y.o. female PMH of sleep apnea, HTN, HLD, carpal tunnel, stroke who presented to [facility] on 9/24 for SOB. She was admitted by the medical services there, noted to have a pneumonia/UTI. Pt also found to have L sided weakness. Pt transfered to [facility]. MRI revealed bila small acute infarct with, in deep white matter watershed territory on the R. MRA revealed severe R M1 stensosis, R vertebral occlusion, moderate  to severe R P1 stenosis, severe R P2 stenosis and occluded L A1 in addition to mild/mod L m1 stensosis.  Pt s/p R M1 thrombectomy/anbioplasty.   Clinical Impression   PTA, pt was living alone and was independent with ADLs and light IADLs; home information and PLOF collected from daughter by PT. Pt currently requiring Max-Total A for ADLs and bed mobility. Pt presenting with decreased balance, strength, cognition, and activity tolerance. No noted movement at LUE.Marland Kitchen Pt keeping her eyes closed throughout session. Pt would benefit from further acute OT to facilitate safe dc. Recommend dc to SNF for further OT to optimize safety, independence with ADLs, and return to PLOF.      Follow Up Recommendations  SNF;Supervision/Assistance - 24 hour    Equipment Recommendations  Other (comment)(Defer to next venue)    Recommendations for Other Services PT consult;Speech consult     Precautions / Restrictions Precautions Precautions: Fall Precaution Comments: can be agitated Restrictions Weight Bearing Restrictions: No      Mobility Bed Mobility Overal bed mobility: Needs Assistance Bed Mobility: Supine to Sit;Sit to Supine     Supine to sit: Max assist;+2 for physical assistance Sit to supine: Max assist;+2 for physical assistance   General bed mobility comments: pt with no initiation of transfer, dependent for trunk  elevation and LE manabement to/from EOB.  Transfers                 General transfer comment: unable this date    Balance Overall balance assessment: Needs assistance;History of Falls Sitting-balance support: Feet supported;Bilateral upper extremity supported Sitting balance-Leahy Scale: Zero Sitting balance - Comments: pt dependent to maintain EOB balance otherwise would fall posteriorly                                   ADL either performed or assessed with clinical judgement   ADL Overall ADL's : Needs assistance/impaired Eating/Feeding: Maximal assistance;Bed level   Grooming: Wash/dry face;Bed level;Maximal assistance Grooming Details (indicate cue type and reason): Pt requiring hand over hand to perform                                General ADL Comments: Pt requiring Max-Total A for ADLs and bed mobility     Vision         Perception     Praxis      Pertinent Vitals/Pain Pain Assessment: Faces Faces Pain Scale: No hurt Pain Intervention(s): Monitored during session;Limited activity within patient's tolerance;Repositioned     Hand Dominance Right   Extremity/Trunk Assessment Upper Extremity Assessment Upper Extremity Assessment: RUE deficits/detail;LUE deficits/detail RUE Deficits / Details: Limited active movement seen at RUE. Once back in bed, pt rubbing her stomach with RUe and bringing her right hand to her face.  LUE Deficits / Details: No active movement of LUE. Edema noted. WFL for PROM with slight tone at end range LUE Coordination: decreased fine motor;decreased gross motor   Lower Extremity Assessment Lower Extremity Assessment: Defer to PT evaluation RLE Deficits / Details: pt with no voluntary movement, pt holding LE in extension and resisting ROM, in sitting EOB pt bent knees and was able to achieve full knee/ankle PROM LLE Deficits / Details: pt fighting ROM in bed, no active movement, able to achieve full PROM  to knee/ankle in sitting   Cervical / Trunk Assessment Cervical / Trunk Assessment: Normal   Communication Communication Communication: No difficulties   Cognition Arousal/Alertness: Lethargic Behavior During Therapy: Agitated Overall Cognitive Status: Impaired/Different from baseline Area of Impairment: Orientation;Attention;Following commands;Awareness;Problem solving;Safety/judgement                 Orientation Level: Disoriented to;Place;Time;Situation Current Attention Level: Focused   Following Commands: Follows one step commands inconsistently(no command follow) Safety/Judgement: Decreased awareness of safety;Decreased awareness of deficits Awareness: Intellectual Problem Solving: Slow processing;Decreased initiation;Difficulty sequencing;Requires verbal cues;Requires tactile cues General Comments: Per daughter and shes states "she has never cussed at anybody. She is a church going lady. Pt was indep PTA and even brought in her own wood, as she heats with wood."    General Comments  RR elevating to 37 from 20s during activity. SpO2 dropping to 83 on RA. 4LO2 via [place] SpO2 97%. BP 159/101 supine and 163/145 sitting at EOB    Exercises     Shoulder Instructions      Home Living Family/patient expects to be discharged to:: Private residence Living Arrangements: Alone Available Help at Discharge: Family;Available 24 hours/day Type of Home: House Home Access: Ramped entrance     Home Layout: One level     Bathroom Shower/Tub: Teacher, early years/pre: Standard     Home Equipment: Environmental consultant - 4 wheels;Walker - 2 wheels   Additional Comments: dtr and son could provide 24/7      Prior Functioning/Environment Level of Independence: Independent with assistive device(s)        Comments: heats with wood, would bring in her own wood, doesn't drive but would grocery shop herself via her dtr or son providing her a ride, uses a RW        OT Problem List:  Decreased strength;Decreased range of motion;Decreased activity tolerance;Impaired balance (sitting and/or standing);Decreased cognition;Decreased safety awareness;Decreased knowledge of use of DME or AE;Decreased knowledge of precautions;Decreased coordination;Pain      OT Treatment/Interventions: Self-care/ADL training;Therapeutic exercise;Energy conservation;DME and/or AE instruction;Therapeutic activities;Patient/family education    OT Goals(Current goals can be found in the care plan section) Acute Rehab OT Goals Patient Stated Goal: "Leave me alone" OT Goal Formulation: With patient Time For Goal Achievement: 02/21/19 Potential to Achieve Goals: Good  OT Frequency: Min 3X/week   Barriers to D/C:            Co-evaluation PT/OT/SLP Co-Evaluation/Treatment: Yes Reason for Co-Treatment: Complexity of the patient's impairments (multi-system involvement);For patient/therapist safety;To address functional/ADL transfers   OT goals addressed during session: ADL's and self-care      AM-PAC OT "6 Clicks" Daily Activity     Outcome Measure Help from another person eating meals?: A Lot Help from another person taking care of personal grooming?: A Lot Help from another person toileting, which includes using toliet, bedpan, or urinal?: A Lot Help from another person bathing (including washing, rinsing, drying)?: A Lot Help from another person to put on  and taking off regular upper body clothing?: A Lot Help from another person to put on and taking off regular lower body clothing?: Total 6 Click Score: 11   End of Session Equipment Utilized During Treatment: Oxygen(4L) Nurse Communication: Mobility status  Activity Tolerance: Patient limited by fatigue;Patient limited by lethargy Patient left: in bed;with call bell/phone within reach;with bed alarm set  OT Visit Diagnosis: Unsteadiness on feet (R26.81);Other abnormalities of gait and mobility (R26.89);Muscle weakness (generalized)  (M62.81);Other symptoms and signs involving cognitive function                Time: [id number] OT Time Calculation (min): 11 min Charges:  OT General Charges $OT Visit: 1 Visit OT Evaluation $OT Eval Moderate Complexity: [place], OTR/L Acute Rehab Pager: [phone] Office: [place] 02/07/2019, 1:16 PM

## 2019-02-07 NOTE — Progress Notes (Addendum)
  Speech Language Pathology Treatment: Dysphagia  Patient Details Name: Christy Carter MRN: [id number] DOB: [birth year] Today's Date: 02/07/2019 Time: [id number] SLP Time Calculation (min) (ACUTE ONLY): 8 min  Assessment / Plan / Recommendation Clinical Impression  RN reported pt had Haldol at 9:30 for MRI but has po meds scheduled for 6:00. Despite max multimodal cues with cold cloth, sternal rub and attempts to reposition pt responded x 1 but could not maintain alertness. Therapist educated daughter re: results and recommendations of MBS (thick liquids) and progression of po's as she improves and answered questions. SLP will continue to intervene for po safety and upgrade. Unable to initiate SLE.      HPI HPI: Christy Carter is a 85 y.o. female PMH of sleep apnea, HTN, HLD, carpal tunnel, stroke who presented to [facility] on 9/24 for SOB. She was admitted by the medical services there, noted to have a pneumonia/UTI. She was noted to have some left sided weakness on initial exam, and again today, with LKW documented to be 0200 02/05/2019. Further imaging was done in the form of MRI of the brain after a CT showed no acute changes.  The MRI brain showed bilateral small acute infarct with the largest being in the deep white matter watershed territory on the right. The neurologist over at [facility] discussed with the family who wanted aggressive care and she was transferred directly to the IR suite at [facility].  CXR 9/24: vascular congestion; "Streaky bibasilar opacities, favor atelectasis".  MRI 9/26:       SLP Plan  Continue with current plan of care       Recommendations  Diet recommendations: Dysphagia 1 (puree);Honey-thick liquid Liquids provided via: Cup Medication Administration: Whole meds with puree Supervision: Full supervision/cueing for compensatory strategies Compensations: Slow rate;Small sips/bites Postural Changes and/or Swallow Maneuvers: Seated upright 90 degrees                 Oral Care Recommendations: Oral care BID Follow up Recommendations: 24 hour supervision/assistance SLP Visit Diagnosis: Dysphagia, oropharyngeal phase (R13.12) Plan: Continue with current plan of care                       Houston Siren 02/07/2019, 4:07 PM   Orbie Pyo Colvin Caroli.Ed Risk analyst [phone] Office [phone]

## 2019-02-07 NOTE — Progress Notes (Addendum)
Extremely agitated, not oriented to place. Per RN - violent towards staff. Requesting precedex. Pt with recent stroke s/p angioplasty of rt M1 - SBP still in 160s - on Cleviprex 28/h   Increase Cleviprex to 32 (Max 32)  I will try Ativan 1mg  IV x1 and Seroquel 25mg  PO now.  -- Amie Portland, MD [facility] Pager: [phone] If 7pm to 7am, please call on call as listed on AMION.

## 2019-02-07 NOTE — Progress Notes (Signed)
Pt has become increasingly more agitated and confused, disoriented to time, place, and situation. Difficult to get SBP to goal of 140 despite Cleviprex titrations due to agitation. Pt is also having audible wheezing. Dr. Rory Percy notified, orders received for ativan, seroquel, DuoNeb and CXR.

## 2019-02-07 NOTE — Progress Notes (Signed)
Referring Physician(s): Dr Myrla Halsted  Supervising Physician: [name]  Patient Status:  [facility] - In-pt  Chief Complaint:  Right MCA M1 segment pre-occlusive stenosis s/p emergent endovascular revascularization using balloon angioplasty x3 02/05/2019 by Dr. Estanislado Pandy  Subjective:  Up in bed; sleepy Had Ativan last pm secondary agitation Wheezing per RN CXR today: IMPRESSION: 1. Increasingly coalescent hazy opacity throughout the lower lungs with some interval development of fissural thickening. Findings could reflect interstitial/alveolar edema superimposed on more chronic interstitial changes. Infection could have a similar appearance and should be excluded clinically. 2. Possible trace left pleural effusion.  Pt still somewhat agitated when she awakes-- Able to take meds with pudding this am  ASA/Brilinta po For MR today   Allergies: Patient has no known allergies.  Medications: Prior to Admission medications   Medication Sig Start Date End Date Taking? Authorizing Provider  allopurinol (ZYLOPRIM) 100 MG tablet Take 2 tabs at bed time for gout 01/12/19   Ronnell Freshwater, NP  aspirin 325 MG tablet Take 1 tablet (325 mg total) by mouth daily. 02/06/19   Ottie Glazier, MD  bisoprolol-hydrochlorothiazide (ZIAC) 2.5-6.25 MG tablet Take 1 tablet by mouth daily. 01/12/19   Ronnell Freshwater, NP  celecoxib (CELEBREX) 200 MG capsule Take 1 capsule (200 mg total) by mouth daily. 07/21/18   Ronnell Freshwater, NP  colchicine 0.6 MG tablet Take 1 tablet (0.6 mg total) by mouth 2 (two) times daily. 12/06/18   Ronnell Freshwater, NP  Cyanocobalamin (B-12) 2500 MCG TABS Take 2,500 mcg by mouth daily. As needed for lethargy    [provider]  gabapentin (NEURONTIN) 100 MG capsule Take 1 tab po twice a day for gout 01/31/19   Ronnell Freshwater, NP  HYDROcodone-acetaminophen (NORCO/VICODIN) 5-325 MG tablet Take 1 tablet by mouth 3 (three) times daily as needed for moderate  pain. 01/12/19   Ronnell Freshwater, NP  ibuprofen (ADVIL) 200 MG tablet Take 200 mg by mouth every 6 (six) hours as needed.    [provider]  levothyroxine (SYNTHROID, LEVOTHROID) 100 MCG tablet Take 1 tablet (100 mcg total) by mouth daily. Patient taking differently: Take 100 mcg by mouth daily before breakfast.  06/28/18   Ronnell Freshwater, NP  nitrofurantoin, macrocrystal-monohydrate, (MACROBID) 100 MG capsule Take 1 capsule (100 mg total) by mouth 2 (two) times daily. 02/02/19   Kendell Bane, NP  omeprazole (PRILOSEC) 40 MG capsule Take 1 capsule (40 mg total) by mouth daily. 11/24/18   Kendell Bane, NP  pneumococcal 23 valent vaccine (PNEUMOVAX 23) 25 MCG/0.5ML injection Inject 0.73ml IM once 01/12/19   Leretha Pol E, NP  potassium chloride (K-DUR) 10 MEQ tablet Take 10 mEq by mouth 2 (two) times daily. 07/16/18 07/16/19  [provider]  triamterene-hydrochlorothiazide (MAXZIDE-25) 37.5-25 MG tablet TAKE 1 TABLET BY MOUTH EVERY DAY FOR BLOOD PRESSURE 01/31/19   Scarboro, Audie Clear, NP  VENTOLIN HFA 108 (90 Base) MCG/ACT inhaler TAKE 2 PUFFS BY MOUTH EVERY 6 HOURS AS NEEDED FOR WHEEZE OR SHORTNESS OF BREATH 03/19/18   Ronnell Freshwater, NP  Vitamin D, Ergocalciferol, (DRISDOL) 1.25 MG (50000 UT) CAPS capsule Take 1 capsule (50,000 Units total) by mouth every 7 (seven) days. 02/02/19   Kendell Bane, NP  vitamin E 400 UNIT capsule Take 400 Units by mouth daily.    [provider]     Vital Signs: BP 129/75   Pulse 99   Temp 98 F (36.7 C) (Axillary)  Resp (!) 25   Ht 5\' 6"  (1.676 m)   Wt 238 lb 5.1 oz (108.1 kg)   SpO2 96%   BMI 38.47 kg/m   Physical Exam Vitals signs reviewed.  HENT:     Head:     Comments: Face symmetrical tongue out for pudding/meds  Musculoskeletal:     Comments: Moves Rt foot and hand to command No movement on left  Rt groin is clean and dry NT no bleeding No hematoma  Rt foot 1+ pulses   Skin:    General: Skin is warm  and dry.     Imaging: Ct Angio Head W Or Wo Contrast  Result Date: 02/05/2019 CLINICAL DATA:  Stroke. Left-sided weakness. Right-sided watershed infarction by MRI. Small left temporal stroke. EXAM: CT ANGIOGRAPHY HEAD AND NECK CT PERFUSION BRAIN TECHNIQUE: Multidetector CT imaging of the head and neck was performed using the standard protocol during bolus administration of intravenous contrast. Multiplanar CT image reconstructions and MIPs were obtained to evaluate the vascular anatomy. Carotid stenosis measurements (when applicable) are obtained utilizing NASCET criteria, using the distal internal carotid diameter as the denominator. Multiphase CT imaging of the brain was performed following IV bolus contrast injection. Subsequent parametric perfusion maps were calculated using RAPID software. CONTRAST:  147mL OMNIPAQUE IOHEXOL 350 MG/ML SOLN COMPARISON:  MRI studies same day FINDINGS: CTA NECK FINDINGS Aortic arch: Aortic atherosclerosis. Branching pattern shows atherosclerotic disease. There is motion degradation. I do not suspect a flow limiting origin stenosis. Left vertebral arises directly from the arch. Right carotid system: Common carotid artery widely patent to the bifurcation. Calcified plaque at the carotid bifurcation and proximal ICA bulb. Minimal diameter is 3 mm. Compared to a more distal cervical ICA diameter of 5 mm, this indicates a 40% stenosis. Left carotid system: Common carotid artery is widely patent to the bifurcation region. Calcified plaque at the carotid bifurcation. No stenosis. Vertebral arteries: Right vertebral artery is occluded at the origin. Left vertebral artery originates from the arch. The origin cannot be accurately evaluated. The left vertebral artery is patent through the cervical region to the foramen magnum. There is reconstitution of the right vertebral artery at C1. Skeleton: Ordinary spondylosis. Other neck: No mass or lymphadenopathy. Upper chest: Negative Review  of the MIP images confirms the above findings CTA HEAD FINDINGS Anterior circulation: Both internal carotid arteries are patent through the skull base and siphon regions. There is atherosclerotic calcification in both carotid siphon regions without stenosis greater than 30%. The right A1 segment appears widely patent. There is severe stenosis in the right M1 segment with near occlusion. Distal vessels do show good opacification. Both anterior cerebral arteries opacified normally, thanks to the presence of a patent anterior communicating artery. The left A1 segment is occluded. The left M1 segment is widely patent. Left MCA branch vessels show flow. Stenosis at the origin of the left temporal branch. Posterior circulation: Left vertebral artery is widely patent through the foramen magnum to the basilar. There is retrograde and or reconstituted flow in the distal right vertebral artery. Multiple stenoses are present in the right V4 segment. There is moderate stenosis of the proximal basilar artery, 50%. More distal basilar artery is widely patent. Superior cerebellar and posterior cerebral arteries are patent. There is moderate stenosis at the right P1 P2 junction. Venous sinuses: Patent and normal. Anatomic variants: None other significant. Review of the MIP images confirms the above findings CT Brain Perfusion Findings: ASPECTS: 10 CBF (<30%) Volume: 74mL Perfusion (Tmax>6.0s) volume:  3 ccmL. There is a 110 cc region of T-max greater than 4 seconds in the right middle cerebral artery territory, secondary to the severe M1 stenosis. Mismatch Volume: 27mL Infarction Location:No infarction identified. At risk brain in the posterior parietal region on the right measuring 3 cc. If 1 considers the T-max greater than 4 second brain to be at risk, there is considerable at risk brain throughout the right MCA territory. IMPRESSION: CT angiography confirms the MR findings. Severe stenosis with near occlusion of the right M1  segment. Occlusion of the left A1 segment. Moderate stenosis of the right P1 P2 junction. 40% stenosis at the right carotid bifurcation. Right vertebral artery occluded at its origin. Reconstituted flow and retrograde flow in the distal right vertebral artery. 50% proximal basilar stenosis. No CBF less than 30% identified by this study. Possible reperfusion of the completed infarctions of the deep brain on the right shown by MRI. Small focus of T-max greater than 6 seconds in the right parietal region. 110 cc region of T-max greater than 4 seconds throughout the right middle cerebral artery distribution, indicating at risk brain secondary to the near occlusion of the right MCA. These results were called by telephone at the time of interpretation on 02/05/2019 at 3:47 pm to provider [facility] , who verbally acknowledged these results. Electronically Signed   By: Nelson Chimes M.D.   On: 02/05/2019 15:55   Ct Head Wo Contrast  Result Date: 02/05/2019 CLINICAL DATA:  Focal neuro deficit.  Left-sided weakness dysarthria EXAM: CT HEAD WITHOUT CONTRAST TECHNIQUE: Contiguous axial images were obtained from the base of the skull through the vertex without intravenous contrast. COMPARISON:  CT head 02/03/2019 FINDINGS: Brain: Moderate atrophy and ventricular enlargement, stable. Patchy hypodensity in the white matter right greater than left, stable. Negative for hemorrhage or mass.  No midline shift. Vascular: Negative for hyperdense vessel Skull: Negative Sinuses/Orbits: Negative Other: None IMPRESSION: No change from yesterday Moderate atrophy. Microvascular ischemic changes are present a white matter right greater than left, stable from prior study. Electronically Signed   By: Franchot Gallo M.D.   On: 02/05/2019 08:59   Ct Head Wo Contrast  Result Date: 02/03/2019 CLINICAL DATA:  Altered level of consciousness, fall EXAM: CT HEAD WITHOUT CONTRAST TECHNIQUE: Contiguous axial images were obtained from the base  of the skull through the vertex without intravenous contrast. COMPARISON:  09/06/2013 FINDINGS: Brain: There is atrophy and chronic small vessel disease changes. No acute intracranial abnormality. Specifically, no hemorrhage, hydrocephalus, mass lesion, acute infarction, or significant intracranial injury. Vascular: No hyperdense vessel or unexpected calcification. Skull: No acute calvarial abnormality. Sinuses/Orbits: Visualized paranasal sinuses and mastoids clear. Orbital soft tissues unremarkable. Other: None IMPRESSION: Atrophy, chronic microvascular disease. No acute intracranial abnormality. Electronically Signed   By: Rolm Baptise M.D.   On: 02/03/2019 18:48   Ct Angio Neck W Or Wo Contrast  Result Date: 02/05/2019 CLINICAL DATA:  Stroke. Left-sided weakness. Right-sided watershed infarction by MRI. Small left temporal stroke. EXAM: CT ANGIOGRAPHY HEAD AND NECK CT PERFUSION BRAIN TECHNIQUE: Multidetector CT imaging of the head and neck was performed using the standard protocol during bolus administration of intravenous contrast. Multiplanar CT image reconstructions and MIPs were obtained to evaluate the vascular anatomy. Carotid stenosis measurements (when applicable) are obtained utilizing NASCET criteria, using the distal internal carotid diameter as the denominator. Multiphase CT imaging of the brain was performed following IV bolus contrast injection. Subsequent parametric perfusion maps were calculated using RAPID software. CONTRAST:  156mL  OMNIPAQUE IOHEXOL 350 MG/ML SOLN COMPARISON:  MRI studies same day FINDINGS: CTA NECK FINDINGS Aortic arch: Aortic atherosclerosis. Branching pattern shows atherosclerotic disease. There is motion degradation. I do not suspect a flow limiting origin stenosis. Left vertebral arises directly from the arch. Right carotid system: Common carotid artery widely patent to the bifurcation. Calcified plaque at the carotid bifurcation and proximal ICA bulb. Minimal diameter  is 3 mm. Compared to a more distal cervical ICA diameter of 5 mm, this indicates a 40% stenosis. Left carotid system: Common carotid artery is widely patent to the bifurcation region. Calcified plaque at the carotid bifurcation. No stenosis. Vertebral arteries: Right vertebral artery is occluded at the origin. Left vertebral artery originates from the arch. The origin cannot be accurately evaluated. The left vertebral artery is patent through the cervical region to the foramen magnum. There is reconstitution of the right vertebral artery at C1. Skeleton: Ordinary spondylosis. Other neck: No mass or lymphadenopathy. Upper chest: Negative Review of the MIP images confirms the above findings CTA HEAD FINDINGS Anterior circulation: Both internal carotid arteries are patent through the skull base and siphon regions. There is atherosclerotic calcification in both carotid siphon regions without stenosis greater than 30%. The right A1 segment appears widely patent. There is severe stenosis in the right M1 segment with near occlusion. Distal vessels do show good opacification. Both anterior cerebral arteries opacified normally, thanks to the presence of a patent anterior communicating artery. The left A1 segment is occluded. The left M1 segment is widely patent. Left MCA branch vessels show flow. Stenosis at the origin of the left temporal branch. Posterior circulation: Left vertebral artery is widely patent through the foramen magnum to the basilar. There is retrograde and or reconstituted flow in the distal right vertebral artery. Multiple stenoses are present in the right V4 segment. There is moderate stenosis of the proximal basilar artery, 50%. More distal basilar artery is widely patent. Superior cerebellar and posterior cerebral arteries are patent. There is moderate stenosis at the right P1 P2 junction. Venous sinuses: Patent and normal. Anatomic variants: None other significant. Review of the MIP images confirms the  above findings CT Brain Perfusion Findings: ASPECTS: 10 CBF (<30%) Volume: 59mL Perfusion (Tmax>6.0s) volume: 3 ccmL. There is a 110 cc region of T-max greater than 4 seconds in the right middle cerebral artery territory, secondary to the severe M1 stenosis. Mismatch Volume: 14mL Infarction Location:No infarction identified. At risk brain in the posterior parietal region on the right measuring 3 cc. If 1 considers the T-max greater than 4 second brain to be at risk, there is considerable at risk brain throughout the right MCA territory. IMPRESSION: CT angiography confirms the MR findings. Severe stenosis with near occlusion of the right M1 segment. Occlusion of the left A1 segment. Moderate stenosis of the right P1 P2 junction. 40% stenosis at the right carotid bifurcation. Right vertebral artery occluded at its origin. Reconstituted flow and retrograde flow in the distal right vertebral artery. 50% proximal basilar stenosis. No CBF less than 30% identified by this study. Possible reperfusion of the completed infarctions of the deep brain on the right shown by MRI. Small focus of T-max greater than 6 seconds in the right parietal region. 110 cc region of T-max greater than 4 seconds throughout the right middle cerebral artery distribution, indicating at risk brain secondary to the near occlusion of the right MCA. These results were called by telephone at the time of interpretation on 02/05/2019 at 3:47 pm to provider  LESLIE REYNOLDS , who verbally acknowledged these results. Electronically Signed   By: Nelson Chimes M.D.   On: 02/05/2019 15:55   Mr Angio Head Wo Contrast  Result Date: 02/05/2019 CLINICAL DATA:  Stroke.  Left-sided weakness. EXAM: MRI HEAD WITHOUT  CONTRAST MRA HEAD WITHOUT CONTRAST MRA NECK WITHOUT AND WITH CONTRAST TECHNIQUE: Multiplanar, multiecho pulse sequences of the brain and surrounding structures were obtained without intravenous contrast. Angiographic images of the Circle of Willis were  obtained using MRA technique without intravenous contrast. Angiographic images of the neck were obtained using MRA technique without and with intravenous contrast. Carotid stenosis measurements (when applicable) are obtained utilizing NASCET criteria, using the distal internal carotid diameter as the denominator. CONTRAST:  59mL GADAVIST GADOBUTROL 1 MMOL/ML IV SOLN COMPARISON:  CT head 02/05/2019.  MRI 09/07/2013 FINDINGS: MRI HEAD FINDINGS Brain: Acute infarct in the deep white matter on the right with scattered small areas of acute infarct. Additional small areas of acute infarct in the left parietal periventricular white matter and left parietal cortex. Small acute infarct left lateral posterior temporal lobe. Generalized atrophy with ventricular enlargement similar to 2015. Chronic microvascular ischemic changes in the white matter. Small chronic infarct right cerebellum. Retro cerebellar cyst unchanged. Negative for hemorrhage or mass. Vascular: Normal arterial flow voids. Skull and upper cervical spine: Negative Sinuses/Orbits: Negative Other: None MRA HEAD FINDINGS Left vertebral artery supplies the basilar and is widely patent. Distal right vertebral artery occluded. Mild stenosis proximal basilar. Main basilar is patent. Moderate to severe stenosis right P1 segment. Severe stenosis right P2 segment. Moderate stenosis left P1 segment. Mild atherosclerotic irregularity and mild stenosis in the internal carotid artery bilaterally. Left A1 segment occluded. Both anterior cerebral arteries patent and supplied from the right Severe stenosis right M1 segment with distal flow present. Mild to moderate stenosis proximal left M1. Left middle cerebral artery branches widely patent. Negative for cerebral aneurysm. MRA NECK FINDINGS Normal aortic arch.  Proximal great vessels patent. Irregularity of the proximal right common carotid artery most likely artifact from breathing motion although possibly atherosclerotic  stenosis. Carotid bifurcation widely patent bilaterally. Mild to moderate stenosis left internal carotid artery a at the petrous segment. Left vertebral artery widely patent to the basilar without stenosis Occlusion of the proximal and mid right vertebral artery with reconstitution distally. Severely disease distal right vertebral artery which may have minimal contribution to the basilar. IMPRESSION: 1. Acute infarct deep white matter on the right in a watershed territory, likely due to severe stenosis right M1 segment. 2. Small acute infarct left lateral posterior temporal lobe 3. Carotid bifurcation widely patent in the neck. Mild to moderate stenosis of the left petrous carotid. 4. Left vertebral artery widely patent. Right vertebral artery severely diseased with long segment occlusion and partial reconstitution distally. 5. Moderate to severe stenosis right P1. Severe stenosis right P2 segment. Moderate stenosis left P1 segment. 6. Occluded left A1 segment. Severe stenosis right M1 segment. Mild to moderate stenosis left M1. Electronically Signed   By: Franchot Gallo M.D.   On: 02/05/2019 13:06   Mr Angio Neck W Wo Contrast  Result Date: 02/05/2019 CLINICAL DATA:  Stroke.  Left-sided weakness. EXAM: MRI HEAD WITHOUT  CONTRAST MRA HEAD WITHOUT CONTRAST MRA NECK WITHOUT AND WITH CONTRAST TECHNIQUE: Multiplanar, multiecho pulse sequences of the brain and surrounding structures were obtained without intravenous contrast. Angiographic images of the Circle of Willis were obtained using MRA technique without intravenous contrast. Angiographic images of the neck were obtained using MRA  technique without and with intravenous contrast. Carotid stenosis measurements (when applicable) are obtained utilizing NASCET criteria, using the distal internal carotid diameter as the denominator. CONTRAST:  25mL GADAVIST GADOBUTROL 1 MMOL/ML IV SOLN COMPARISON:  CT head 02/05/2019.  MRI 09/07/2013 FINDINGS: MRI HEAD FINDINGS  Brain: Acute infarct in the deep white matter on the right with scattered small areas of acute infarct. Additional small areas of acute infarct in the left parietal periventricular white matter and left parietal cortex. Small acute infarct left lateral posterior temporal lobe. Generalized atrophy with ventricular enlargement similar to 2015. Chronic microvascular ischemic changes in the white matter. Small chronic infarct right cerebellum. Retro cerebellar cyst unchanged. Negative for hemorrhage or mass. Vascular: Normal arterial flow voids. Skull and upper cervical spine: Negative Sinuses/Orbits: Negative Other: None MRA HEAD FINDINGS Left vertebral artery supplies the basilar and is widely patent. Distal right vertebral artery occluded. Mild stenosis proximal basilar. Main basilar is patent. Moderate to severe stenosis right P1 segment. Severe stenosis right P2 segment. Moderate stenosis left P1 segment. Mild atherosclerotic irregularity and mild stenosis in the internal carotid artery bilaterally. Left A1 segment occluded. Both anterior cerebral arteries patent and supplied from the right Severe stenosis right M1 segment with distal flow present. Mild to moderate stenosis proximal left M1. Left middle cerebral artery branches widely patent. Negative for cerebral aneurysm. MRA NECK FINDINGS Normal aortic arch.  Proximal great vessels patent. Irregularity of the proximal right common carotid artery most likely artifact from breathing motion although possibly atherosclerotic stenosis. Carotid bifurcation widely patent bilaterally. Mild to moderate stenosis left internal carotid artery a at the petrous segment. Left vertebral artery widely patent to the basilar without stenosis Occlusion of the proximal and mid right vertebral artery with reconstitution distally. Severely disease distal right vertebral artery which may have minimal contribution to the basilar. IMPRESSION: 1. Acute infarct deep white matter on the  right in a watershed territory, likely due to severe stenosis right M1 segment. 2. Small acute infarct left lateral posterior temporal lobe 3. Carotid bifurcation widely patent in the neck. Mild to moderate stenosis of the left petrous carotid. 4. Left vertebral artery widely patent. Right vertebral artery severely diseased with long segment occlusion and partial reconstitution distally. 5. Moderate to severe stenosis right P1. Severe stenosis right P2 segment. Moderate stenosis left P1 segment. 6. Occluded left A1 segment. Severe stenosis right M1 segment. Mild to moderate stenosis left M1. Electronically Signed   By: Franchot Gallo M.D.   On: 02/05/2019 13:06   Mr Brain Wo Contrast  Result Date: 02/05/2019 CLINICAL DATA:  Stroke.  Left-sided weakness. EXAM: MRI HEAD WITHOUT  CONTRAST MRA HEAD WITHOUT CONTRAST MRA NECK WITHOUT AND WITH CONTRAST TECHNIQUE: Multiplanar, multiecho pulse sequences of the brain and surrounding structures were obtained without intravenous contrast. Angiographic images of the Circle of Willis were obtained using MRA technique without intravenous contrast. Angiographic images of the neck were obtained using MRA technique without and with intravenous contrast. Carotid stenosis measurements (when applicable) are obtained utilizing NASCET criteria, using the distal internal carotid diameter as the denominator. CONTRAST:  26mL GADAVIST GADOBUTROL 1 MMOL/ML IV SOLN COMPARISON:  CT head 02/05/2019.  MRI 09/07/2013 FINDINGS: MRI HEAD FINDINGS Brain: Acute infarct in the deep white matter on the right with scattered small areas of acute infarct. Additional small areas of acute infarct in the left parietal periventricular white matter and left parietal cortex. Small acute infarct left lateral posterior temporal lobe. Generalized atrophy with ventricular enlargement similar to 2015. Chronic  microvascular ischemic changes in the white matter. Small chronic infarct right cerebellum. Retro  cerebellar cyst unchanged. Negative for hemorrhage or mass. Vascular: Normal arterial flow voids. Skull and upper cervical spine: Negative Sinuses/Orbits: Negative Other: None MRA HEAD FINDINGS Left vertebral artery supplies the basilar and is widely patent. Distal right vertebral artery occluded. Mild stenosis proximal basilar. Main basilar is patent. Moderate to severe stenosis right P1 segment. Severe stenosis right P2 segment. Moderate stenosis left P1 segment. Mild atherosclerotic irregularity and mild stenosis in the internal carotid artery bilaterally. Left A1 segment occluded. Both anterior cerebral arteries patent and supplied from the right Severe stenosis right M1 segment with distal flow present. Mild to moderate stenosis proximal left M1. Left middle cerebral artery branches widely patent. Negative for cerebral aneurysm. MRA NECK FINDINGS Normal aortic arch.  Proximal great vessels patent. Irregularity of the proximal right common carotid artery most likely artifact from breathing motion although possibly atherosclerotic stenosis. Carotid bifurcation widely patent bilaterally. Mild to moderate stenosis left internal carotid artery a at the petrous segment. Left vertebral artery widely patent to the basilar without stenosis Occlusion of the proximal and mid right vertebral artery with reconstitution distally. Severely disease distal right vertebral artery which may have minimal contribution to the basilar. IMPRESSION: 1. Acute infarct deep white matter on the right in a watershed territory, likely due to severe stenosis right M1 segment. 2. Small acute infarct left lateral posterior temporal lobe 3. Carotid bifurcation widely patent in the neck. Mild to moderate stenosis of the left petrous carotid. 4. Left vertebral artery widely patent. Right vertebral artery severely diseased with long segment occlusion and partial reconstitution distally. 5. Moderate to severe stenosis right P1. Severe stenosis right  P2 segment. Moderate stenosis left P1 segment. 6. Occluded left A1 segment. Severe stenosis right M1 segment. Mild to moderate stenosis left M1. Electronically Signed   By: Franchot Gallo M.D.   On: 02/05/2019 13:06   Ct Cerebral Perfusion W Contrast  Result Date: 02/05/2019 CLINICAL DATA:  Stroke. Left-sided weakness. Right-sided watershed infarction by MRI. Small left temporal stroke. EXAM: CT ANGIOGRAPHY HEAD AND NECK CT PERFUSION BRAIN TECHNIQUE: Multidetector CT imaging of the head and neck was performed using the standard protocol during bolus administration of intravenous contrast. Multiplanar CT image reconstructions and MIPs were obtained to evaluate the vascular anatomy. Carotid stenosis measurements (when applicable) are obtained utilizing NASCET criteria, using the distal internal carotid diameter as the denominator. Multiphase CT imaging of the brain was performed following IV bolus contrast injection. Subsequent parametric perfusion maps were calculated using RAPID software. CONTRAST:  163mL OMNIPAQUE IOHEXOL 350 MG/ML SOLN COMPARISON:  MRI studies same day FINDINGS: CTA NECK FINDINGS Aortic arch: Aortic atherosclerosis. Branching pattern shows atherosclerotic disease. There is motion degradation. I do not suspect a flow limiting origin stenosis. Left vertebral arises directly from the arch. Right carotid system: Common carotid artery widely patent to the bifurcation. Calcified plaque at the carotid bifurcation and proximal ICA bulb. Minimal diameter is 3 mm. Compared to a more distal cervical ICA diameter of 5 mm, this indicates a 40% stenosis. Left carotid system: Common carotid artery is widely patent to the bifurcation region. Calcified plaque at the carotid bifurcation. No stenosis. Vertebral arteries: Right vertebral artery is occluded at the origin. Left vertebral artery originates from the arch. The origin cannot be accurately evaluated. The left vertebral artery is patent through the  cervical region to the foramen magnum. There is reconstitution of the right vertebral artery at C1. Skeleton:  Ordinary spondylosis. Other neck: No mass or lymphadenopathy. Upper chest: Negative Review of the MIP images confirms the above findings CTA HEAD FINDINGS Anterior circulation: Both internal carotid arteries are patent through the skull base and siphon regions. There is atherosclerotic calcification in both carotid siphon regions without stenosis greater than 30%. The right A1 segment appears widely patent. There is severe stenosis in the right M1 segment with near occlusion. Distal vessels do show good opacification. Both anterior cerebral arteries opacified normally, thanks to the presence of a patent anterior communicating artery. The left A1 segment is occluded. The left M1 segment is widely patent. Left MCA branch vessels show flow. Stenosis at the origin of the left temporal branch. Posterior circulation: Left vertebral artery is widely patent through the foramen magnum to the basilar. There is retrograde and or reconstituted flow in the distal right vertebral artery. Multiple stenoses are present in the right V4 segment. There is moderate stenosis of the proximal basilar artery, 50%. More distal basilar artery is widely patent. Superior cerebellar and posterior cerebral arteries are patent. There is moderate stenosis at the right P1 P2 junction. Venous sinuses: Patent and normal. Anatomic variants: None other significant. Review of the MIP images confirms the above findings CT Brain Perfusion Findings: ASPECTS: 10 CBF (<30%) Volume: 86mL Perfusion (Tmax>6.0s) volume: 3 ccmL. There is a 110 cc region of T-max greater than 4 seconds in the right middle cerebral artery territory, secondary to the severe M1 stenosis. Mismatch Volume: 44mL Infarction Location:No infarction identified. At risk brain in the posterior parietal region on the right measuring 3 cc. If 1 considers the T-max greater than 4 second  brain to be at risk, there is considerable at risk brain throughout the right MCA territory. IMPRESSION: CT angiography confirms the MR findings. Severe stenosis with near occlusion of the right M1 segment. Occlusion of the left A1 segment. Moderate stenosis of the right P1 P2 junction. 40% stenosis at the right carotid bifurcation. Right vertebral artery occluded at its origin. Reconstituted flow and retrograde flow in the distal right vertebral artery. 50% proximal basilar stenosis. No CBF less than 30% identified by this study. Possible reperfusion of the completed infarctions of the deep brain on the right shown by MRI. Small focus of T-max greater than 6 seconds in the right parietal region. 110 cc region of T-max greater than 4 seconds throughout the right middle cerebral artery distribution, indicating at risk brain secondary to the near occlusion of the right MCA. These results were called by telephone at the time of interpretation on 02/05/2019 at 3:47 pm to provider [facility] , who verbally acknowledged these results. Electronically Signed   By: Nelson Chimes M.D.   On: 02/05/2019 15:55   Dg Chest Port 1 View  Result Date: 02/07/2019 CLINICAL DATA:  Shortness of breath, wheezing EXAM: PORTABLE CHEST 1 VIEW COMPARISON:  Most recent radiograph 02/04/2019, CT 10/13/2018 FINDINGS: Cleat chronically coarsened interstitial changes are again seen throughout the lungs. There is increasingly coalescent hazy opacity throughout the lower lungs with some interval development of fissural thickening. The cardiac silhouette appears enlarged from prior portable radiograph. No pneumothorax. Some hazy obscuration of the left hemidiaphragm could suggest a trace effusion. No acute osseous or soft tissue abnormality. Degenerative changes are present in the imaged spine and shoulders. IMPRESSION: 1. Increasingly coalescent hazy opacity throughout the lower lungs with some interval development of fissural thickening.  Findings could reflect interstitial/alveolar edema superimposed on more chronic interstitial changes. Infection could have a similar  appearance and should be excluded clinically. 2. Possible trace left pleural effusion. Electronically Signed   By: Lovena Le M.D.   On: 02/07/2019 01:50   Dg Chest Port 1 View  Result Date: 02/04/2019 CLINICAL DATA:  Acute respiratory failure. EXAM: PORTABLE CHEST 1 VIEW COMPARISON:  02/03/2019 and earlier exams. FINDINGS: Vascular congestion and interstitial thickening is stable from the previous day's study. No lung consolidation. No convincing pleural effusion and no pneumothorax. Mild streaky basilar atelectasis. Cardiac silhouette normal in size. IMPRESSION: 1. Stable exam from the previous day's study. 2. Vascular congestion with interstitial prominence. No evidence of pneumonia. Electronically Signed   By: Lajean Manes M.D.   On: 02/04/2019 09:17   Dg Chest Portable 1 View  Result Date: 02/03/2019 CLINICAL DATA:  Shortness of breath EXAM: PORTABLE CHEST 1 VIEW COMPARISON:  10/13/2018 FINDINGS: The heart size and mediastinal contours are within normal limits. Calcific aortic. Mild pulmonary vascular congestion. Streaky bibasilar opacities. No pleural effusion or pneumothorax. IMPRESSION: 1. Mild pulmonary vascular congestion. 2. Streaky bibasilar opacities, favor atelectasis. Electronically Signed   By: Davina Poke M.D.   On: 02/03/2019 10:34   Dg Swallowing Func-speech Pathology  Result Date: 02/06/2019 Objective Swallowing Evaluation: Type of Study: MBS-Modified Barium Swallow Study  Patient Details Name: Christy Carter MRN: [id number] Date of Birth: [birth year] Today's Date: 02/06/2019 Time: SLP Start Time (ACUTE ONLY): 27 -SLP Stop Time (ACUTE ONLY): 1147 SLP Time Calculation (min) (ACUTE ONLY): 17 min Past Medical History: Past Medical History: Diagnosis Date . Arthritis  . Carpal tunnel syndrome  . Complication of anesthesia   Difficulty waking up.  Believes it took 2 days to wake up after her surgery. . Depression  . Hyperlipidemia  . Hypertension  . Shortness of breath  . Stroke ([place])  . Thyroid disease  Past Surgical History: Past Surgical History: Procedure Laterality Date . APPENDECTOMY   . BACK SURGERY   . CHOLECYSTECTOMY   . HERNIA REPAIR   HPI: SIONNA MUCHNICK is a 85 y.o. female PMH of sleep apnea, HTN, HLD, carpal tunnel, stroke who presented to [facility] on 9/24 for SOB. She was admitted by the medical services there, noted to have a pneumonia/UTI. She was noted to have some left sided weakness on initial exam, and again today, with LKW documented to be 0200 02/05/2019. Further imaging was done in the form of MRI of the brain after a CT showed no acute changes.  The MRI brain showed bilateral small acute infarct with the largest being in the deep white matter watershed territory on the right. The neurologist over at [facility] regional discussed with the family who wanted aggressive care and she was transferred directly to the IR suite at [facility].  CXR 9/24: vascular congestion; "Streaky bibasilar opacities, favor atelectasis".  MRI 9/26 concerning for CVA  Subjective: Pt awake, alert, pleasant, participative Assessment / Plan / Recommendation CHL IP CLINICAL IMPRESSIONS 02/06/2019 Clinical Impression Pt presents with mild oropharyngeal dysphagia c/b decreased labial seal, reduced lingual strength and coordination, premature spillage, delayed swallow initiation, reduced base of tongue retraction, incomplete laryngeal closure, and diminished sensation.  These deficits resulted in piecemeal deglution, BOT and vallecula residue with solids, and penetration of thin and nectar thick liquids.  Penetration occured before swallow 2/2 delayed swallow initiation.  Not all penetration was cleared during swallow.  With pill simulation, penetration of thin liquid increased with penetration reaching the vocal folds.  There was a reflexive cough with this episode.   Penetration appeared  to clear; however, pt's positioning and body habitus precluded full and complete visualization of laryngeal vestibule and trachea throughout study.  No aspiration was observed.  Penetration above the vocal folds noted with nectar thick liquid.  There was no penetration honey thick liquid, puree or solid consistencies. With solid textures there was significant oral residue with spillover of residuals to vallecula without initiation of cleansing swallow.  Oral residue noted on L side during clinical evaluation. Recommend initiation of puree diet with honey thick liquid.  This is a consevative diet recommendation based on incomplete visualization of larynx and trachea during study and pt's inconsistent cough reflex.  Consider FEES for further instrumental evaluation if needed. SLP Visit Diagnosis -- Attention and concentration deficit following -- Frontal lobe and executive function deficit following -- Impact on safety and function Mild aspiration risk   CHL IP TREATMENT RECOMMENDATION 02/06/2019 Treatment Recommendations Therapy as outlined in treatment plan below   Prognosis 02/06/2019 Prognosis for Safe Diet Advancement Good Barriers to Reach Goals -- Barriers/Prognosis Comment -- CHL IP DIET RECOMMENDATION 02/06/2019 SLP Diet Recommendations Dysphagia 1 (Puree) solids;Honey thick liquids Liquid Administration via Cup;No straw Medication Administration Whole meds with puree Compensations Slow rate;Small sips/bites Postural Changes Seated upright at 90 degrees   CHL IP OTHER RECOMMENDATIONS 02/06/2019 Recommended Consults -- Oral Care Recommendations Oral care BID Other Recommendations Order thickener from pharmacy   No flowsheet data found.  CHL IP FREQUENCY AND DURATION 02/06/2019 Speech Therapy Frequency (ACUTE ONLY) min 2x/week Treatment Duration 2 weeks      CHL IP ORAL PHASE 02/06/2019 Oral Phase Impaired Oral - Pudding Teaspoon -- Oral - Pudding Cup -- Oral - Honey Teaspoon -- Oral - Honey Cup  Left anterior bolus loss;Right anterior bolus loss;Premature spillage Oral - Nectar Teaspoon -- Oral - Nectar Cup -- Oral - Nectar Straw Premature spillage Oral - Thin Teaspoon -- Oral - Thin Cup -- Oral - Thin Straw Premature spillage Oral - Puree Premature spillage Oral - Mech Soft -- Oral - Regular Lingual/palatal residue;Decreased bolus cohesion;Delayed oral transit;Piecemeal swallowing Oral - Multi-Consistency -- Oral - Pill Premature spillage Oral Phase - Comment --  CHL IP PHARYNGEAL PHASE 02/06/2019 Pharyngeal Phase Impaired Pharyngeal- Pudding Teaspoon -- Pharyngeal -- Pharyngeal- Pudding Cup -- Pharyngeal -- Pharyngeal- Honey Teaspoon -- Pharyngeal -- Pharyngeal- Honey Cup Delayed swallow initiation-pyriform sinuses Pharyngeal Material does not enter airway Pharyngeal- Nectar Teaspoon -- Pharyngeal -- Pharyngeal- Nectar Cup -- Pharyngeal -- Pharyngeal- Nectar Straw Delayed swallow initiation-vallecula;Reduced airway/laryngeal closure Pharyngeal Material enters airway, remains ABOVE vocal cords and not ejected out Pharyngeal- Thin Teaspoon -- Pharyngeal -- Pharyngeal- Thin Cup -- Pharyngeal -- Pharyngeal- Thin Straw Delayed swallow initiation-pyriform sinuses;Reduced airway/laryngeal closure;Penetration/Aspiration before swallow;Penetration/Aspiration during swallow Pharyngeal Material enters airway, remains ABOVE vocal cords and not ejected out;Material enters airway, CONTACTS cords and then ejected out Pharyngeal- Puree Delayed swallow initiation-vallecula;Reduced pharyngeal peristalsis;Reduced tongue base retraction Pharyngeal Material does not enter airway Pharyngeal- Mechanical Soft -- Pharyngeal -- Pharyngeal- Regular Delayed swallow initiation-vallecula;Reduced pharyngeal peristalsis;Reduced tongue base retraction;Pharyngeal residue - valleculae Pharyngeal Material does not enter airway Pharyngeal- Multi-consistency -- Pharyngeal -- Pharyngeal- Pill Delayed swallow initiation-vallecula Pharyngeal  Material does not enter airway Pharyngeal Comment --  CHL IP CERVICAL ESOPHAGEAL PHASE 02/06/2019 Cervical Esophageal Phase WFL Pudding Teaspoon -- Pudding Cup -- Honey Teaspoon -- Honey Cup -- Nectar Teaspoon -- Nectar Cup -- Nectar Straw -- Thin Teaspoon -- Thin Cup -- Thin Straw -- Puree -- Mechanical Soft -- Regular -- Multi-consistency -- Pill -- Cervical Esophageal Comment --  Celedonio Savage, MA, [place] Office: [phone] 02/06/2019, 2:05 PM               Labs:  CBC: Recent Labs    02/03/19 1510 02/04/19 0401 02/05/19 0547 02/06/19 0252  WBC 21.6* 14.5* 9.4 11.5*  HGB 13.4 11.9* 11.5* 12.7  HCT 42.3 37.4 37.1 39.0  PLT 270 243 234 282    COAGS: No results for input(s): INR, APTT in the last 8760 hours.  BMP: Recent Labs    02/04/19 0401 02/05/19 0547 02/06/19 0252 02/07/19 0507  NA 145 140 139 141  K 3.0* 3.3* 4.1 4.5  CL 107 104 105 110  CO2 26 26 23 22   GLUCOSE 106* 103* 163* 109*  BUN 23 30* 21 21  CALCIUM 8.3* 8.4* 8.8* 8.6*  CREATININE 1.00 0.88 0.98 0.93  GFRNONAA 52* >60 53* 57*  GFRAA >60 >60 >60 >60    LIVER FUNCTION TESTS: Recent Labs    02/11/18 2006 11/25/18 1046  BILITOT 0.6 0.2  AST 21 14  ALT 13 12  ALKPHOS 85 90  PROT 7.4 6.5  ALBUMIN 3.8 3.9    Assessment and Plan:  Right MCA M1 segment pre-occlusive stenosis s/p emergent endovascular revascularization using balloon angioplasty x3 02/05/2019 by Dr. Estanislado Pandy Moving Rt to command Left no movement Will report to Dr Estanislado Pandy Plan per Stroke team  Electronically Signed: Lavonia Drafts, PA-C 02/07/2019, 8:58 AM   I spent a total of 15 Minutes at the the patient's bedside AND on the patient's hospital floor or unit, greater than 50% of which was counseling/coordinating care for CVA

## 2019-02-08 ENCOUNTER — Inpatient Hospital Stay (HOSPITAL_COMMUNITY): Payer: Medicare Other

## 2019-02-08 ENCOUNTER — Ambulatory Visit: Payer: Medicare Other | Admitting: Nurse Practitioner

## 2019-02-08 ENCOUNTER — Encounter (HOSPITAL_COMMUNITY): Payer: Self-pay | Admitting: Interventional Radiology

## 2019-02-08 DIAGNOSIS — I639 Cerebral infarction, unspecified: Secondary | ICD-10-CM

## 2019-02-08 LAB — CULTURE, BLOOD (ROUTINE X 2)
Culture: NO GROWTH
Culture: NO GROWTH
Special Requests: ADEQUATE

## 2019-02-08 LAB — BASIC METABOLIC PANEL
Anion gap: 12 (ref 5–15)
BUN: 19 mg/dL (ref 8–23)
CO2: 21 mmol/L — ABNORMAL LOW (ref 22–32)
Calcium: 8.7 mg/dL — ABNORMAL LOW (ref 8.9–10.3)
Chloride: 107 mmol/L (ref 98–111)
Creatinine, Ser: 0.77 mg/dL (ref 0.44–1.00)
GFR calc Af Amer: >60 mL/min (ref >60)
GFR calc non Af Amer: >60 mL/min (ref >60)
Glucose, Bld: 84 mg/dL (ref 70–99)
Potassium: 4 mmol/L (ref 3.5–5.1)
Sodium: 140 mmol/L (ref 135–145)

## 2019-02-08 MED ORDER — POTASSIUM CHLORIDE CRYS ER 10 MEQ PO TBCR
10.0000 meq | EXTENDED_RELEASE_TABLET | Freq: Two times a day (BID) | ORAL | Status: DC
  Administered 2019-02-08 – 2019-02-11 (×6): 10 meq via ORAL
  Filled 2019-02-08 (×6): qty 1

## 2019-02-08 MED ORDER — PANTOPRAZOLE SODIUM 40 MG PO TBEC
80.0000 mg | DELAYED_RELEASE_TABLET | Freq: Every day | ORAL | Status: DC
  Administered 2019-02-09 – 2019-02-11 (×3): 80 mg via ORAL
  Filled 2019-02-08 (×3): qty 2

## 2019-02-08 MED ORDER — QUETIAPINE FUMARATE 25 MG PO TABS
12.5000 mg | ORAL_TABLET | Freq: Every day | ORAL | Status: DC
  Administered 2019-02-08 – 2019-02-10 (×3): 12.5 mg via ORAL
  Filled 2019-02-08 (×3): qty 1

## 2019-02-08 NOTE — Evaluation (Signed)
Speech Language Pathology Evaluation Patient Details Name: Christy Carter MRN: [id number] DOB: [birth year] Today's Date: 02/08/2019 Time: [id number] SLP Time Calculation (min) (ACUTE ONLY): 22 min  Problem List:  Patient Active Problem List   Diagnosis Date Noted  . Stroke (cerebrum) ([place]) 02/05/2019  . Middle cerebral artery stenosis, right 02/05/2019  . Acute respiratory failure ([place]) 02/03/2019  . Acute respiratory failure with hypoxemia ([place]) 02/03/2019  . Abnormal renal function 01/19/2019  . Pain in left foot 12/19/2018  . Hematuria 08/02/2018  . Influenza A 03/02/2018  . Urinary tract infection without hematuria 03/02/2018  . Sepsis ([place]) 02/11/2018  . CAP (community acquired pneumonia) 02/11/2018  . CKD (chronic kidney disease), stage III ([place]) 02/11/2018  . Memory loss, short term 02/10/2018  . Flu vaccine need 02/10/2018  . Chest pain 02/03/2018  . Acute upper respiratory infection 02/03/2018  . Gastroesophageal reflux disease without esophagitis 02/03/2018  . Encounter for general adult medical examination with abnormal findings 01/13/2018  . Chronic low back pain 01/13/2018  . Need for vaccination against Streptococcus pneumoniae using pneumococcal conjugate vaccine 7 01/13/2018  . Dysuria 01/13/2018  . 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 Pelvic pain 10/08/2017  . Gout 06/15/2017  . Mixed hyperlipidemia 06/15/2017  . Inflammatory polyarthropathy ([place]) 06/15/2017  . Neuralgia and neuritis, unspecified 06/15/2017  . Major depressive disorder, recurrent, mild ([place]) 06/15/2017  . Other vitamin B12 deficiency anemias 06/15/2017  . Hypothyroidism 10/17/2008  . OVERWEIGHT/OBESITY 10/17/2008  . SLEEP APNEA, OBSTRUCTIVE 10/17/2008  . Essential hypertension 10/17/2008  . GALLSTONES 10/17/2008  . DYSPNEA 10/17/2008  . Overweight 10/17/2008   Past Medical History:  Past Medical History:  Diagnosis Date  . Arthritis   . Carpal tunnel syndrome   .  Complication of anesthesia    Difficulty waking up. Believes it took 2 days to wake up after her surgery.  . Depression   . Hyperlipidemia   . Hypertension   . Shortness of breath   . Stroke ([place])   . Thyroid disease    Past Surgical History:  Past Surgical History:  Procedure Laterality Date  . APPENDECTOMY    . BACK SURGERY    . CHOLECYSTECTOMY    . HERNIA REPAIR    . IR PTA INTRACRANIAL  02/05/2019  . RADIOLOGY WITH ANESTHESIA N/A 02/05/2019   Procedure: CODE STROKE;  Surgeon: Radiologist, Medication, MD;  Location: [place];  Service: Radiology;  Laterality: N/A;   HPI:  Christy Carter is a 85 y.o. female PMH of sleep apnea, HTN, HLD, carpal tunnel, stroke who presented to [facility] on 9/24 for SOB. She was admitted by the medical services there, noted to have a pneumonia/UTI. She was noted to have some left sided weakness on initial exam, and again today, with LKW documented to be 0200 02/05/2019. Further imaging was done in the form of MRI of the brain after a CT showed no acute changes.  The MRI brain showed bilateral small acute infarct with the largest being in the deep white matter watershed territory on the right. The neurologist over at [facility] regional discussed with the family who wanted aggressive care and she was transferred directly to the IR suite at [facility].  CXR 9/24: vascular congestion; "Streaky bibasilar opacities, favor atelectasis".  MRI 9/26:   Assessment / Plan / Recommendation Clinical Impression  Pt lives alone and was independent prior to this admission. Daughter reported concerns with pt's short term memory and eluded to diminishing reasoning and problem solving  abilities (son and daughter arrived to find strong odor and found pt had cooked sausage that had expired months prior). Pt manages medication and finances independently and reports she does not use a pill box and daughter was not aware of errors although would be a concern given declining memory.  Various subtests of MOCA given showing impairements in visuospatial due to her left inattention. Problem solving, working memory and awareness are areas of growth via compensatory strategies, family support and education. She may benefit from outpatient neurology/neuropsyc assessment for progressive cognitive impairments. ST will intervene while on acute care and recommend home health ST.    SLP Assessment  SLP Recommendation/Assessment: Patient needs continued Speech Lanaguage Pathology Services SLP Visit Diagnosis: Cognitive communication deficit (R41.841)    Follow Up Recommendations  Inpatient Rehab;24 hour supervision/assistance    Frequency and Duration min 2x/week  2 weeks      SLP Evaluation Cognition  Overall Cognitive Status: Impaired/Different from baseline Arousal/Alertness: Awake/alert Orientation Level: Oriented to person;Oriented to time;Oriented to place;Oriented to situation Attention: Sustained Sustained Attention: Appears intact Memory: Impaired Memory Impairment: Decreased recall of new information;Decreased short term memory Awareness: Impaired Awareness Impairment: Intellectual impairment Problem Solving: Impaired Problem Solving Impairment: Functional basic Safety/Judgment: Impaired       Comprehension  Auditory Comprehension Overall Auditory Comprehension: Appears within functional limits for tasks assessed Interfering Components: Working Technical brewer Discrimination: Not tested Reading Comprehension Reading Status: Not tested    Expression Expression Primary Mode of Expression: Verbal Verbal Expression Overall Verbal Expression: Appears within functional limits for tasks assessed(dysnomial several times) Level of Generative/Spontaneous Verbalization: Conversation Repetition: No impairment Naming: Not tested Pragmatics: No impairment Written Expression Dominant Hand: Right Written Expression: Not tested   Oral /  Motor  Oral Motor/Sensory Function Overall Oral Motor/Sensory Function: Mild impairment Facial ROM: Reduced left Facial Symmetry: Abnormal symmetry left Lingual ROM: Reduced left Lingual Symmetry: Abnormal symmetry left Lingual Strength: Reduced Velum: Within Functional Limits Mandible: Within Functional Limits Motor Speech Overall Motor Speech: Appears within functional limits for tasks assessed Respiration: Within functional limits Phonation: Normal Resonance: Within functional limits Articulation: Within functional limitis Intelligibility: Intelligible Motor Planning: Witnin functional limits   GO                    Houston Siren 02/08/2019, 9:19 PM

## 2019-02-08 NOTE — Progress Notes (Signed)
Referring Physician(s): Code Stroke- Kerney Elbe  Supervising Physician: [name]  Patient Status:  [facility] - In-pt  Chief Complaint: "I want to go home"  Subjective:  Right MCA M1 segment pre-occlusive stenosis s/p emergent endovascular revascularization using balloon angioplasty x3 02/05/2019 by Dr. Estanislado Pandy. Patient awake and alert laying in bed. States she wants to go home. Can spontaneously move right side, can lift LLE off bed, can hold LLE up and give thumbs up/hand grip of LUE. Right groin incision c/d/i.  MR/MRA brain/head 02/07/2019: 1. Significantly motion degraded and limited examination. 2. New punctate acute/early subacute infarct within the right temporal occipital lobe. 3. 11 mm focus of subtle apparent restricted diffusion within the right middle cerebellar peduncle. An acute/early subacute infarct cannot be excluded, although the diffusion abnormality is subtle and this may reflect artifact. 4. Persistent restricted diffusion at site of subacute deep white matter infarct on the right. 5. Atrophy and chronic small vessel ischemic disease. Redemonstrated chronic right cerebellar lacunar infarcts. 6. Significantly motion degraded examination. 7. Persistent apparent severe focal stenosis within the M1 right middle cerebral artery. 8. Redemonstrated occlusion of the A1 left anterior cerebral artery. 9. Suspected retrograde flow within the intracranial right vertebral artery. The right cervical vertebral artery was a occluded on recent prior CTA. 10. Moderate stenoses within the bilateral posterior cerebral arteries at the P1-P2 junctions.   Allergies: Patient has no known allergies.  Medications: Prior to Admission medications   Medication Sig Start Date End Date Taking? Authorizing Provider  allopurinol (ZYLOPRIM) 100 MG tablet Take 2 tabs at bed time for gout 01/12/19   Ronnell Freshwater, NP  aspirin 325 MG tablet Take 1 tablet (325 mg total) by mouth  daily. 02/06/19   Ottie Glazier, MD  bisoprolol-hydrochlorothiazide (ZIAC) 2.5-6.25 MG tablet Take 1 tablet by mouth daily. 01/12/19   Ronnell Freshwater, NP  celecoxib (CELEBREX) 200 MG capsule Take 1 capsule (200 mg total) by mouth daily. 07/21/18   Ronnell Freshwater, NP  colchicine 0.6 MG tablet Take 1 tablet (0.6 mg total) by mouth 2 (two) times daily. 12/06/18   Ronnell Freshwater, NP  Cyanocobalamin (B-12) 2500 MCG TABS Take 2,500 mcg by mouth daily. As needed for lethargy    [provider]  gabapentin (NEURONTIN) 100 MG capsule Take 1 tab po twice a day for gout 01/31/19   Ronnell Freshwater, NP  HYDROcodone-acetaminophen (NORCO/VICODIN) 5-325 MG tablet Take 1 tablet by mouth 3 (three) times daily as needed for moderate pain. 01/12/19   Ronnell Freshwater, NP  ibuprofen (ADVIL) 200 MG tablet Take 200 mg by mouth every 6 (six) hours as needed.    [provider]  levothyroxine (SYNTHROID, LEVOTHROID) 100 MCG tablet Take 1 tablet (100 mcg total) by mouth daily. Patient taking differently: Take 100 mcg by mouth daily before breakfast.  06/28/18   Ronnell Freshwater, NP  nitrofurantoin, macrocrystal-monohydrate, (MACROBID) 100 MG capsule Take 1 capsule (100 mg total) by mouth 2 (two) times daily. 02/02/19   Kendell Bane, NP  omeprazole (PRILOSEC) 40 MG capsule Take 1 capsule (40 mg total) by mouth daily. 11/24/18   Kendell Bane, NP  pneumococcal 23 valent vaccine (PNEUMOVAX 23) 25 MCG/0.5ML injection Inject 0.95ml IM once 01/12/19   Leretha Pol E, NP  potassium chloride (K-DUR) 10 MEQ tablet Take 10 mEq by mouth 2 (two) times daily. 07/16/18 07/16/19  [provider]  triamterene-hydrochlorothiazide (MAXZIDE-25) 37.5-25 MG tablet TAKE 1 TABLET BY MOUTH EVERY DAY  FOR BLOOD PRESSURE 01/31/19   Scar[place], Audie Clear, NP  VENTOLIN HFA 108 (90 Base) MCG/ACT inhaler TAKE 2 PUFFS BY MOUTH EVERY 6 HOURS AS NEEDED FOR WHEEZE OR SHORTNESS OF BREATH 03/19/18   Ronnell Freshwater, NP  Vitamin D,  Ergocalciferol, (DRISDOL) 1.25 MG (50000 UT) CAPS capsule Take 1 capsule (50,000 Units total) by mouth every 7 (seven) days. 02/02/19   Kendell Bane, NP  vitamin E 400 UNIT capsule Take 400 Units by mouth daily.    [provider]     Vital Signs: BP (!) 180/74   Pulse 89   Temp 98.1 F (36.7 C) (Axillary)   Resp (!) 26   Ht 5\' 6"  (1.676 m)   Wt 238 lb 5.1 oz (108.1 kg)   SpO2 93%   BMI 38.47 kg/m   Physical Exam Vitals signs and nursing note reviewed.  Constitutional:      General: She is not in acute distress.    Appearance: Normal appearance.  Pulmonary:     Effort: Pulmonary effort is normal. No respiratory distress.  Skin:    General: Skin is warm and dry.     Comments: Right groin incision soft without active bleeding or hematoma.  Neurological:     Mental Status: She is alert.     Comments: Alert, awake, and oriented x3. Speech and comprehension intact. PERRL bilaterally. No facial asymmetry. Tongue midline. Can spontaneously move right side, can lift LLE off bed, can hold LLE up and give thumbs up/hand grip of LUE. Distal pulses palpable bilaterally with Doppler.      Imaging: Ct Angio Head W Or Wo Contrast  Result Date: 02/05/2019 CLINICAL DATA:  Stroke. Left-sided weakness. Right-sided watershed infarction by MRI. Small left temporal stroke. EXAM: CT ANGIOGRAPHY HEAD AND NECK CT PERFUSION BRAIN TECHNIQUE: Multidetector CT imaging of the head and neck was performed using the standard protocol during bolus administration of intravenous contrast. Multiplanar CT image reconstructions and MIPs were obtained to evaluate the vascular anatomy. Carotid stenosis measurements (when applicable) are obtained utilizing NASCET criteria, using the distal internal carotid diameter as the denominator. Multiphase CT imaging of the brain was performed following IV bolus contrast injection. Subsequent parametric perfusion maps were calculated using RAPID software. CONTRAST:   147mL OMNIPAQUE IOHEXOL 350 MG/ML SOLN COMPARISON:  MRI studies same day FINDINGS: CTA NECK FINDINGS Aortic arch: Aortic atherosclerosis. Branching pattern shows atherosclerotic disease. There is motion degradation. I do not suspect a flow limiting origin stenosis. Left vertebral arises directly from the arch. Right carotid system: Common carotid artery widely patent to the bifurcation. Calcified plaque at the carotid bifurcation and proximal ICA bulb. Minimal diameter is 3 mm. Compared to a more distal cervical ICA diameter of 5 mm, this indicates a 40% stenosis. Left carotid system: Common carotid artery is widely patent to the bifurcation region. Calcified plaque at the carotid bifurcation. No stenosis. Vertebral arteries: Right vertebral artery is occluded at the origin. Left vertebral artery originates from the arch. The origin cannot be accurately evaluated. The left vertebral artery is patent through the cervical region to the foramen magnum. There is reconstitution of the right vertebral artery at C1. Skeleton: Ordinary spondylosis. Other neck: No mass or lymphadenopathy. Upper chest: Negative Review of the MIP images confirms the above findings CTA HEAD FINDINGS Anterior circulation: Both internal carotid arteries are patent through the skull base and siphon regions. There is atherosclerotic calcification in both carotid siphon regions without stenosis greater than 30%. The right A1 segment  appears widely patent. There is severe stenosis in the right M1 segment with near occlusion. Distal vessels do show good opacification. Both anterior cerebral arteries opacified normally, thanks to the presence of a patent anterior communicating artery. The left A1 segment is occluded. The left M1 segment is widely patent. Left MCA branch vessels show flow. Stenosis at the origin of the left temporal branch. Posterior circulation: Left vertebral artery is widely patent through the foramen magnum to the basilar. There is  retrograde and or reconstituted flow in the distal right vertebral artery. Multiple stenoses are present in the right V4 segment. There is moderate stenosis of the proximal basilar artery, 50%. More distal basilar artery is widely patent. Superior cerebellar and posterior cerebral arteries are patent. There is moderate stenosis at the right P1 P2 junction. Venous sinuses: Patent and normal. Anatomic variants: None other significant. Review of the MIP images confirms the above findings CT Brain Perfusion Findings: ASPECTS: 10 CBF (<30%) Volume: 21mL Perfusion (Tmax>6.0s) volume: 3 ccmL. There is a 110 cc region of T-max greater than 4 seconds in the right middle cerebral artery territory, secondary to the severe M1 stenosis. Mismatch Volume: 1mL Infarction Location:No infarction identified. At risk brain in the posterior parietal region on the right measuring 3 cc. If 1 considers the T-max greater than 4 second brain to be at risk, there is considerable at risk brain throughout the right MCA territory. IMPRESSION: CT angiography confirms the MR findings. Severe stenosis with near occlusion of the right M1 segment. Occlusion of the left A1 segment. Moderate stenosis of the right P1 P2 junction. 40% stenosis at the right carotid bifurcation. Right vertebral artery occluded at its origin. Reconstituted flow and retrograde flow in the distal right vertebral artery. 50% proximal basilar stenosis. No CBF less than 30% identified by this study. Possible reperfusion of the completed infarctions of the deep brain on the right shown by MRI. Small focus of T-max greater than 6 seconds in the right parietal region. 110 cc region of T-max greater than 4 seconds throughout the right middle cerebral artery distribution, indicating at risk brain secondary to the near occlusion of the right MCA. These results were called by telephone at the time of interpretation on 02/05/2019 at 3:47 pm to provider [facility] , who verbally  acknowledged these results. Electronically Signed   By: Nelson Chimes M.D.   On: 02/05/2019 15:55   Dg Chest 2 View  Result Date: 02/08/2019 CLINICAL DATA:  Shortness of breath. Follow-up pulmonary edema. EXAM: CHEST - 2 VIEW COMPARISON:  02/07/2019 FINDINGS: Heart size remains stable. Aortic atherosclerosis. Diffuse interstitial infiltrates show improvement since previous study, likely due to decreased interstitial edema. Low lung volumes and bibasilar atelectasis show no significant change. Probable small left pleural effusion again noted. IMPRESSION: Interval improvement in diffuse interstitial infiltrates, likely due to decreased interstitial edema. No significant change in bibasilar atelectasis and probable small left pleural effusion. Electronically Signed   By: Marlaine Hind M.D.   On: 02/08/2019 09:40   Ct Head Wo Contrast  Result Date: 02/05/2019 CLINICAL DATA:  Focal neuro deficit.  Left-sided weakness dysarthria EXAM: CT HEAD WITHOUT CONTRAST TECHNIQUE: Contiguous axial images were obtained from the base of the skull through the vertex without intravenous contrast. COMPARISON:  CT head 02/03/2019 FINDINGS: Brain: Moderate atrophy and ventricular enlargement, stable. Patchy hypodensity in the white matter right greater than left, stable. Negative for hemorrhage or mass.  No midline shift. Vascular: Negative for hyperdense vessel Skull: Negative Sinuses/Orbits: Negative  Other: None IMPRESSION: No change from yesterday Moderate atrophy. Microvascular ischemic changes are present a white matter right greater than left, stable from prior study. Electronically Signed   By: Franchot Gallo M.D.   On: 02/05/2019 08:59   Ct Angio Neck W Or Wo Contrast  Result Date: 02/05/2019 CLINICAL DATA:  Stroke. Left-sided weakness. Right-sided watershed infarction by MRI. Small left temporal stroke. EXAM: CT ANGIOGRAPHY HEAD AND NECK CT PERFUSION BRAIN TECHNIQUE: Multidetector CT imaging of the head and neck was  performed using the standard protocol during bolus administration of intravenous contrast. Multiplanar CT image reconstructions and MIPs were obtained to evaluate the vascular anatomy. Carotid stenosis measurements (when applicable) are obtained utilizing NASCET criteria, using the distal internal carotid diameter as the denominator. Multiphase CT imaging of the brain was performed following IV bolus contrast injection. Subsequent parametric perfusion maps were calculated using RAPID software. CONTRAST:  12mL OMNIPAQUE IOHEXOL 350 MG/ML SOLN COMPARISON:  MRI studies same day FINDINGS: CTA NECK FINDINGS Aortic arch: Aortic atherosclerosis. Branching pattern shows atherosclerotic disease. There is motion degradation. I do not suspect a flow limiting origin stenosis. Left vertebral arises directly from the arch. Right carotid system: Common carotid artery widely patent to the bifurcation. Calcified plaque at the carotid bifurcation and proximal ICA bulb. Minimal diameter is 3 mm. Compared to a more distal cervical ICA diameter of 5 mm, this indicates a 40% stenosis. Left carotid system: Common carotid artery is widely patent to the bifurcation region. Calcified plaque at the carotid bifurcation. No stenosis. Vertebral arteries: Right vertebral artery is occluded at the origin. Left vertebral artery originates from the arch. The origin cannot be accurately evaluated. The left vertebral artery is patent through the cervical region to the foramen magnum. There is reconstitution of the right vertebral artery at C1. Skeleton: Ordinary spondylosis. Other neck: No mass or lymphadenopathy. Upper chest: Negative Review of the MIP images confirms the above findings CTA HEAD FINDINGS Anterior circulation: Both internal carotid arteries are patent through the skull base and siphon regions. There is atherosclerotic calcification in both carotid siphon regions without stenosis greater than 30%. The right A1 segment appears widely  patent. There is severe stenosis in the right M1 segment with near occlusion. Distal vessels do show good opacification. Both anterior cerebral arteries opacified normally, thanks to the presence of a patent anterior communicating artery. The left A1 segment is occluded. The left M1 segment is widely patent. Left MCA branch vessels show flow. Stenosis at the origin of the left temporal branch. Posterior circulation: Left vertebral artery is widely patent through the foramen magnum to the basilar. There is retrograde and or reconstituted flow in the distal right vertebral artery. Multiple stenoses are present in the right V4 segment. There is moderate stenosis of the proximal basilar artery, 50%. More distal basilar artery is widely patent. Superior cerebellar and posterior cerebral arteries are patent. There is moderate stenosis at the right P1 P2 junction. Venous sinuses: Patent and normal. Anatomic variants: None other significant. Review of the MIP images confirms the above findings CT Brain Perfusion Findings: ASPECTS: 10 CBF (<30%) Volume: 55mL Perfusion (Tmax>6.0s) volume: 3 ccmL. There is a 110 cc region of T-max greater than 4 seconds in the right middle cerebral artery territory, secondary to the severe M1 stenosis. Mismatch Volume: 52mL Infarction Location:No infarction identified. At risk brain in the posterior parietal region on the right measuring 3 cc. If 1 considers the T-max greater than 4 second brain to be at risk, there is  considerable at risk brain throughout the right MCA territory. IMPRESSION: CT angiography confirms the MR findings. Severe stenosis with near occlusion of the right M1 segment. Occlusion of the left A1 segment. Moderate stenosis of the right P1 P2 junction. 40% stenosis at the right carotid bifurcation. Right vertebral artery occluded at its origin. Reconstituted flow and retrograde flow in the distal right vertebral artery. 50% proximal basilar stenosis. No CBF less than 30%  identified by this study. Possible reperfusion of the completed infarctions of the deep brain on the right shown by MRI. Small focus of T-max greater than 6 seconds in the right parietal region. 110 cc region of T-max greater than 4 seconds throughout the right middle cerebral artery distribution, indicating at risk brain secondary to the near occlusion of the right MCA. These results were called by telephone at the time of interpretation on 02/05/2019 at 3:47 pm to provider [facility] , who verbally acknowledged these results. Electronically Signed   By: Nelson Chimes M.D.   On: 02/05/2019 15:55   Mr Angio Head Wo Contrast  Result Date: 02/07/2019 CLINICAL DATA:  Follow-up right MCA angioplasty x3 02/05/2019. EXAM: MRI HEAD WITHOUT CONTRAST MRA HEAD WITHOUT CONTRAST TECHNIQUE: Multiplanar, multiecho pulse sequences of the brain and surrounding structures were obtained without intravenous contrast. Angiographic images of the head were obtained using MRA technique without contrast. CORONARY ARTERIES: CT angiogram head/neck and CT brain perfusion 02/05/2019, MRI/MRA brain 02/05/2019 FINDINGS: MRI HEAD FINDINGS Brain: Multiple sequences are significantly motion degraded, limiting evaluation. Persistent restricted diffusion at site of a deep white matter subacute infarct on the right. Corresponding T2/FLAIR hyperintensity at this site. The additional previously demonstrated recent small cerebral infarcts are no longer well appreciated on motion degraded diffusion-weighted imaging. There is a new punctate acute/early subacute infarct within the right temporal occipital lobe (series 2, image 25). Additionally, there is an 11 mm focus of subtle apparent restricted diffusion within the right middle cerebellar peduncle which may reflect an acute/early subacute infarct versus artifact (series 2, image 18). No evidence of intracranial mass. No midline shift or extra-axial fluid collection. There are few chronic  microhemorrhages along the margin of the posterior body/atrium of the right lateral ventricle. Moderate scattered and confluent T2/FLAIR hyperintensity within the cerebral white matter is nonspecific, but consistent with chronic small vessel ischemic disease. Redemonstrated small chronic lacunar infarcts within the right cerebellum. Mild generalized parenchymal atrophy. Unchanged retrocerebellar cyst Vascular: Reported separately Skull and upper cervical spine: Normal marrow signal. Sinuses/Orbits: Visualized orbits demonstrate no acute abnormality. Mild ethmoid sinus mucosal thickening. No significant mastoid effusion. MRA HEAD FINDINGS MR angiography of the head is significantly motion degraded, limiting evaluation. The intracranial internal carotid arteries are patent without evidence of high-grade stenosis. There is persistent apparent severe focal stenosis within the M1 right middle cerebral artery (series 652, image 10). Flow related signal within the more distal right MCA branches. The right anterior cerebral artery is patent without high-grade proximal stenosis. As before, the A1 left anterior cerebral artery is occluded. The left middle cerebral artery is patent without high-grade proximal a stenosis. No significant flow related signal is demonstrated within the intracranial right vertebral artery. The cervical right vertebral artery was occluded on prior CTA, and this likely reflects retrograde flow within the intracranial right vertebral artery. The left vertebral artery is patent without significant stenosis. Redemonstrated moderate (approximately 50%) stenosis within the caudal basilar artery. Distal to this, the basilar artery is widely patent. Moderate stenoses within the bilateral posterior cerebral arteries  at the P1-P2 junctions. These results were called by telephone at the time of interpretation on 02/07/2019 at 11:53 am to provider Dr. Leonie Man, who verbally acknowledged these results. IMPRESSION:  MRI brain: 1. Significantly motion degraded and limited examination. 2. New punctate acute/early subacute infarct within the right temporal occipital lobe. 3. 11 mm focus of subtle apparent restricted diffusion within the right middle cerebellar peduncle. An acute/early subacute infarct cannot be excluded, although the diffusion abnormality is subtle and this may reflect artifact. 4. Persistent restricted diffusion at site of subacute deep white matter infarct on the right. 5. Atrophy and chronic small vessel ischemic disease. Redemonstrated chronic right cerebellar lacunar infarcts. MRA head: 1. Significantly motion degraded examination. 2. Persistent apparent severe focal stenosis within the M1 right middle cerebral artery. 3. Redemonstrated occlusion of the A1 left anterior cerebral artery. 4. Suspected retrograde flow within the intracranial right vertebral artery. The right cervical vertebral artery was a occluded on recent prior CTA. 5. Moderate stenoses within the bilateral posterior cerebral arteries at the P1-P2 junctions. Electronically Signed   By: Kellie Simmering   On: 02/07/2019 11:53   Mr Angio Head Wo Contrast  Result Date: 02/05/2019 CLINICAL DATA:  Stroke.  Left-sided weakness. EXAM: MRI HEAD WITHOUT  CONTRAST MRA HEAD WITHOUT CONTRAST MRA NECK WITHOUT AND WITH CONTRAST TECHNIQUE: Multiplanar, multiecho pulse sequences of the brain and surrounding structures were obtained without intravenous contrast. Angiographic images of the Circle of Willis were obtained using MRA technique without intravenous contrast. Angiographic images of the neck were obtained using MRA technique without and with intravenous contrast. Carotid stenosis measurements (when applicable) are obtained utilizing NASCET criteria, using the distal internal carotid diameter as the denominator. CONTRAST:  39mL GADAVIST GADOBUTROL 1 MMOL/ML IV SOLN COMPARISON:  CT head 02/05/2019.  MRI 09/07/2013 FINDINGS: MRI HEAD FINDINGS Brain:  Acute infarct in the deep white matter on the right with scattered small areas of acute infarct. Additional small areas of acute infarct in the left parietal periventricular white matter and left parietal cortex. Small acute infarct left lateral posterior temporal lobe. Generalized atrophy with ventricular enlargement similar to 2015. Chronic microvascular ischemic changes in the white matter. Small chronic infarct right cerebellum. Retro cerebellar cyst unchanged. Negative for hemorrhage or mass. Vascular: Normal arterial flow voids. Skull and upper cervical spine: Negative Sinuses/Orbits: Negative Other: None MRA HEAD FINDINGS Left vertebral artery supplies the basilar and is widely patent. Distal right vertebral artery occluded. Mild stenosis proximal basilar. Main basilar is patent. Moderate to severe stenosis right P1 segment. Severe stenosis right P2 segment. Moderate stenosis left P1 segment. Mild atherosclerotic irregularity and mild stenosis in the internal carotid artery bilaterally. Left A1 segment occluded. Both anterior cerebral arteries patent and supplied from the right Severe stenosis right M1 segment with distal flow present. Mild to moderate stenosis proximal left M1. Left middle cerebral artery branches widely patent. Negative for cerebral aneurysm. MRA NECK FINDINGS Normal aortic arch.  Proximal great vessels patent. Irregularity of the proximal right common carotid artery most likely artifact from breathing motion although possibly atherosclerotic stenosis. Carotid bifurcation widely patent bilaterally. Mild to moderate stenosis left internal carotid artery a at the petrous segment. Left vertebral artery widely patent to the basilar without stenosis Occlusion of the proximal and mid right vertebral artery with reconstitution distally. Severely disease distal right vertebral artery which may have minimal contribution to the basilar. IMPRESSION: 1. Acute infarct deep white matter on the right in a  watershed territory, likely due to severe stenosis  right M1 segment. 2. Small acute infarct left lateral posterior temporal lobe 3. Carotid bifurcation widely patent in the neck. Mild to moderate stenosis of the left petrous carotid. 4. Left vertebral artery widely patent. Right vertebral artery severely diseased with long segment occlusion and partial reconstitution distally. 5. Moderate to severe stenosis right P1. Severe stenosis right P2 segment. Moderate stenosis left P1 segment. 6. Occluded left A1 segment. Severe stenosis right M1 segment. Mild to moderate stenosis left M1. Electronically Signed   By: Franchot Gallo M.D.   On: 02/05/2019 13:06   Mr Angio Neck W Wo Contrast  Result Date: 02/05/2019 CLINICAL DATA:  Stroke.  Left-sided weakness. EXAM: MRI HEAD WITHOUT  CONTRAST MRA HEAD WITHOUT CONTRAST MRA NECK WITHOUT AND WITH CONTRAST TECHNIQUE: Multiplanar, multiecho pulse sequences of the brain and surrounding structures were obtained without intravenous contrast. Angiographic images of the Circle of Willis were obtained using MRA technique without intravenous contrast. Angiographic images of the neck were obtained using MRA technique without and with intravenous contrast. Carotid stenosis measurements (when applicable) are obtained utilizing NASCET criteria, using the distal internal carotid diameter as the denominator. CONTRAST:  23mL GADAVIST GADOBUTROL 1 MMOL/ML IV SOLN COMPARISON:  CT head 02/05/2019.  MRI 09/07/2013 FINDINGS: MRI HEAD FINDINGS Brain: Acute infarct in the deep white matter on the right with scattered small areas of acute infarct. Additional small areas of acute infarct in the left parietal periventricular white matter and left parietal cortex. Small acute infarct left lateral posterior temporal lobe. Generalized atrophy with ventricular enlargement similar to 2015. Chronic microvascular ischemic changes in the white matter. Small chronic infarct right cerebellum. Retro cerebellar  cyst unchanged. Negative for hemorrhage or mass. Vascular: Normal arterial flow voids. Skull and upper cervical spine: Negative Sinuses/Orbits: Negative Other: None MRA HEAD FINDINGS Left vertebral artery supplies the basilar and is widely patent. Distal right vertebral artery occluded. Mild stenosis proximal basilar. Main basilar is patent. Moderate to severe stenosis right P1 segment. Severe stenosis right P2 segment. Moderate stenosis left P1 segment. Mild atherosclerotic irregularity and mild stenosis in the internal carotid artery bilaterally. Left A1 segment occluded. Both anterior cerebral arteries patent and supplied from the right Severe stenosis right M1 segment with distal flow present. Mild to moderate stenosis proximal left M1. Left middle cerebral artery branches widely patent. Negative for cerebral aneurysm. MRA NECK FINDINGS Normal aortic arch.  Proximal great vessels patent. Irregularity of the proximal right common carotid artery most likely artifact from breathing motion although possibly atherosclerotic stenosis. Carotid bifurcation widely patent bilaterally. Mild to moderate stenosis left internal carotid artery a at the petrous segment. Left vertebral artery widely patent to the basilar without stenosis Occlusion of the proximal and mid right vertebral artery with reconstitution distally. Severely disease distal right vertebral artery which may have minimal contribution to the basilar. IMPRESSION: 1. Acute infarct deep white matter on the right in a watershed territory, likely due to severe stenosis right M1 segment. 2. Small acute infarct left lateral posterior temporal lobe 3. Carotid bifurcation widely patent in the neck. Mild to moderate stenosis of the left petrous carotid. 4. Left vertebral artery widely patent. Right vertebral artery severely diseased with long segment occlusion and partial reconstitution distally. 5. Moderate to severe stenosis right P1. Severe stenosis right P2  segment. Moderate stenosis left P1 segment. 6. Occluded left A1 segment. Severe stenosis right M1 segment. Mild to moderate stenosis left M1. Electronically Signed   By: Franchot Gallo M.D.   On: 02/05/2019 13:06  Mr Brain Wo Contrast  Result Date: 02/07/2019 CLINICAL DATA:  Follow-up right MCA angioplasty x3 02/05/2019. EXAM: MRI HEAD WITHOUT CONTRAST MRA HEAD WITHOUT CONTRAST TECHNIQUE: Multiplanar, multiecho pulse sequences of the brain and surrounding structures were obtained without intravenous contrast. Angiographic images of the head were obtained using MRA technique without contrast. CORONARY ARTERIES: CT angiogram head/neck and CT brain perfusion 02/05/2019, MRI/MRA brain 02/05/2019 FINDINGS: MRI HEAD FINDINGS Brain: Multiple sequences are significantly motion degraded, limiting evaluation. Persistent restricted diffusion at site of a deep white matter subacute infarct on the right. Corresponding T2/FLAIR hyperintensity at this site. The additional previously demonstrated recent small cerebral infarcts are no longer well appreciated on motion degraded diffusion-weighted imaging. There is a new punctate acute/early subacute infarct within the right temporal occipital lobe (series 2, image 25). Additionally, there is an 11 mm focus of subtle apparent restricted diffusion within the right middle cerebellar peduncle which may reflect an acute/early subacute infarct versus artifact (series 2, image 18). No evidence of intracranial mass. No midline shift or extra-axial fluid collection. There are few chronic microhemorrhages along the margin of the posterior body/atrium of the right lateral ventricle. Moderate scattered and confluent T2/FLAIR hyperintensity within the cerebral white matter is nonspecific, but consistent with chronic small vessel ischemic disease. Redemonstrated small chronic lacunar infarcts within the right cerebellum. Mild generalized parenchymal atrophy. Unchanged retrocerebellar cyst  Vascular: Reported separately Skull and upper cervical spine: Normal marrow signal. Sinuses/Orbits: Visualized orbits demonstrate no acute abnormality. Mild ethmoid sinus mucosal thickening. No significant mastoid effusion. MRA HEAD FINDINGS MR angiography of the head is significantly motion degraded, limiting evaluation. The intracranial internal carotid arteries are patent without evidence of high-grade stenosis. There is persistent apparent severe focal stenosis within the M1 right middle cerebral artery (series 652, image 10). Flow related signal within the more distal right MCA branches. The right anterior cerebral artery is patent without high-grade proximal stenosis. As before, the A1 left anterior cerebral artery is occluded. The left middle cerebral artery is patent without high-grade proximal a stenosis. No significant flow related signal is demonstrated within the intracranial right vertebral artery. The cervical right vertebral artery was occluded on prior CTA, and this likely reflects retrograde flow within the intracranial right vertebral artery. The left vertebral artery is patent without significant stenosis. Redemonstrated moderate (approximately 50%) stenosis within the caudal basilar artery. Distal to this, the basilar artery is widely patent. Moderate stenoses within the bilateral posterior cerebral arteries at the P1-P2 junctions. These results were called by telephone at the time of interpretation on 02/07/2019 at 11:53 am to provider Dr. Leonie Man, who verbally acknowledged these results. IMPRESSION: MRI brain: 1. Significantly motion degraded and limited examination. 2. New punctate acute/early subacute infarct within the right temporal occipital lobe. 3. 11 mm focus of subtle apparent restricted diffusion within the right middle cerebellar peduncle. An acute/early subacute infarct cannot be excluded, although the diffusion abnormality is subtle and this may reflect artifact. 4. Persistent  restricted diffusion at site of subacute deep white matter infarct on the right. 5. Atrophy and chronic small vessel ischemic disease. Redemonstrated chronic right cerebellar lacunar infarcts. MRA head: 1. Significantly motion degraded examination. 2. Persistent apparent severe focal stenosis within the M1 right middle cerebral artery. 3. Redemonstrated occlusion of the A1 left anterior cerebral artery. 4. Suspected retrograde flow within the intracranial right vertebral artery. The right cervical vertebral artery was a occluded on recent prior CTA. 5. Moderate stenoses within the bilateral posterior cerebral arteries at the P1-P2 junctions. Electronically  Signed   By: Kellie Simmering   On: 02/07/2019 11:53   Mr Brain Wo Contrast  Result Date: 02/05/2019 CLINICAL DATA:  Stroke.  Left-sided weakness. EXAM: MRI HEAD WITHOUT  CONTRAST MRA HEAD WITHOUT CONTRAST MRA NECK WITHOUT AND WITH CONTRAST TECHNIQUE: Multiplanar, multiecho pulse sequences of the brain and surrounding structures were obtained without intravenous contrast. Angiographic images of the Circle of Willis were obtained using MRA technique without intravenous contrast. Angiographic images of the neck were obtained using MRA technique without and with intravenous contrast. Carotid stenosis measurements (when applicable) are obtained utilizing NASCET criteria, using the distal internal carotid diameter as the denominator. CONTRAST:  32mL GADAVIST GADOBUTROL 1 MMOL/ML IV SOLN COMPARISON:  CT head 02/05/2019.  MRI 09/07/2013 FINDINGS: MRI HEAD FINDINGS Brain: Acute infarct in the deep white matter on the right with scattered small areas of acute infarct. Additional small areas of acute infarct in the left parietal periventricular white matter and left parietal cortex. Small acute infarct left lateral posterior temporal lobe. Generalized atrophy with ventricular enlargement similar to 2015. Chronic microvascular ischemic changes in the white matter. Small  chronic infarct right cerebellum. Retro cerebellar cyst unchanged. Negative for hemorrhage or mass. Vascular: Normal arterial flow voids. Skull and upper cervical spine: Negative Sinuses/Orbits: Negative Other: None MRA HEAD FINDINGS Left vertebral artery supplies the basilar and is widely patent. Distal right vertebral artery occluded. Mild stenosis proximal basilar. Main basilar is patent. Moderate to severe stenosis right P1 segment. Severe stenosis right P2 segment. Moderate stenosis left P1 segment. Mild atherosclerotic irregularity and mild stenosis in the internal carotid artery bilaterally. Left A1 segment occluded. Both anterior cerebral arteries patent and supplied from the right Severe stenosis right M1 segment with distal flow present. Mild to moderate stenosis proximal left M1. Left middle cerebral artery branches widely patent. Negative for cerebral aneurysm. MRA NECK FINDINGS Normal aortic arch.  Proximal great vessels patent. Irregularity of the proximal right common carotid artery most likely artifact from breathing motion although possibly atherosclerotic stenosis. Carotid bifurcation widely patent bilaterally. Mild to moderate stenosis left internal carotid artery a at the petrous segment. Left vertebral artery widely patent to the basilar without stenosis Occlusion of the proximal and mid right vertebral artery with reconstitution distally. Severely disease distal right vertebral artery which may have minimal contribution to the basilar. IMPRESSION: 1. Acute infarct deep white matter on the right in a watershed territory, likely due to severe stenosis right M1 segment. 2. Small acute infarct left lateral posterior temporal lobe 3. Carotid bifurcation widely patent in the neck. Mild to moderate stenosis of the left petrous carotid. 4. Left vertebral artery widely patent. Right vertebral artery severely diseased with long segment occlusion and partial reconstitution distally. 5. Moderate to severe  stenosis right P1. Severe stenosis right P2 segment. Moderate stenosis left P1 segment. 6. Occluded left A1 segment. Severe stenosis right M1 segment. Mild to moderate stenosis left M1. Electronically Signed   By: Franchot Gallo M.D.   On: 02/05/2019 13:06   Ir Pta Intracranial  Result Date: 02/08/2019 INDICATION: New onset left-sided weakness with waxing and waning symptoms. Now complete occlusion of the right middle cerebral artery mid M1 segment associated with thrombus as per CT angiogram of the head and neck. EXAM: 1. EMERGENT LARGE VESSEL OCCLUSION THROMBOLYSIS (anterior CIRCULATION) COMPARISON:  CT angiogram of the head and neck of February 05, 2019. MEDICATIONS: Ancef 2 g IV antibiotic was administered within 1 hour of the procedure. ANESTHESIA/SEDATION: General anesthesia. CONTRAST:  Isovue 300  approximately 110 mL. FLUOROSCOPY TIME:  Fluoroscopy Time: 47 minutes 18 seconds (3219 mGy). COMPLICATIONS: None immediate. TECHNIQUE: Following a full explanation of the procedure along with the potential associated complications, an informed witnessed consent was obtained from the patient's daughter. The risks of intracranial hemorrhage of 10%, worsening neurological deficit, ventilator dependency, death and inability to revascularize were all reviewed in detail with the patient's daughter. The patient was then put under general anesthesia by the Department of Anesthesiology at [facility]. The right groin was prepped and draped in the usual sterile fashion. Thereafter using modified Seldinger technique, transfemoral access into the right common femoral artery was obtained without difficulty. Over a 0.035 inch guidewire a 5 French Pinnacle sheath was inserted. Through this, and also over a 0.035 inch guidewire a 5 [place] JB 1 catheter was advanced to the aortic arch region and selectively positioned in the innominate artery, and right common carotid artery. FINDINGS: The innominate arteriogram  demonstrates the origins of the right subclavian artery and the right common carotid artery to be widely patent. No angiographic evidence of a right vertebral artery is seen. The right common carotid arteriogram demonstrates the right external carotid artery and its major branches to be widely patent. The right internal carotid artery at the bulb demonstrates a smooth shallow plaque along the posterior wall associated with approximately 10% narrowing by the NASCET criteria. No evidence of intraluminal filling defects is seen. The right internal carotid artery, otherwise, opacifies unimpeded to the cranial skull base. There is wide patency of the petrous, cavernous, and the supraclinoid segments. The right posterior communicating artery is seen opacifying the right posterior cerebral artery distribution. The right anterior cerebral artery opacifies into the capillary and venous phases. There is cross-filling with anterior communicating artery of the left anterior cerebral A2 segment and distally. The right middle cerebral artery demonstrates segmental severe pre occlusive stenosis of the mid M1 segment with slow filling of the superior and inferior divisions. Marked delay of opacification of the peri-insular branches is seen on the lateral sequences. PROCEDURE: The diagnostic JB 1 catheter in the right common carotid artery was then exchanged over a 0.035 inch 300 cm exchange micro guidewire for an 8 French Pinnacle sheath in the right groin which was then connected to continuous heparinized saline infusion. Over the exchange guidewire, an 8 [place] Neuron Max guide sheath was then advanced initially into the right common carotid artery bifurcation region. The guidewire was removed. Good aspiration was obtained from the hub of the Neuron Max sheath. A gentle control arteriogram demonstrated no evidence of spasms, dissections or of intraluminal filling defects. Through this, and over a 0.035 inch Roadrunner guidewire,  a 5 French 115 cm Catalyst guide catheter was advanced to the distal cervical petrous junction. The guidewire was removed. Good aspiration obtained from the hub of the 5 French catheter guide catheter. A control arteriogram performed through this demonstrated no change in the severe pre occlusive stenosis of the right middle cerebral artery M1 segment. Measurements were then performed of the right middle cerebral artery distal and proximal to the severe high-grade stenosis. It was decided to proceed with endovascular balloon angioplasty of this segment in order to prevent eminent closure of the right middle cerebral artery. A Gateway balloon angioplasty catheter of size 2 mm x 9 mm was prepped and purged with heparinized saline infusion. Over a 0.014 inch Softip Synchro micro guidewire, the Gateway balloon angioplasty microcatheter was advanced to the supraclinoid right ICA. Using a torque device,  the right middle cerebral artery was then crossed with the micro guidewire to the M2 M3 region followed by the balloon angioplasty catheter. The distal and the proximal zones was then positioned adequate distance from the severe high-grade stenosis. Control angioplasty was then performed using a micro inflation syringe device via micro tubing. Initially the balloon was inflated to approximately 1.87 mm in slow increments where it was then maintained for approximately 40 seconds. The balloon was then deflated and slightly retrieved more proximally as the wire was maintained distally. A control arteriogram performed gently to the 5 [place] Catalyst guide catheter demonstrated significant improved caliber and flow through the middle cerebral artery distribution with improved hemodynamic flow through the right middle cerebral distribution. Recoil was noted on the subsequent 5 mm repeat arteriogram which prompted a second angioplasty this time balloon being inflated to approximately 1.9 mm where it was maintained for  approximately 30 seconds, and slightly more proximally at the origin of the right middle cerebral artery again inflated to approximately 1.9 mm again slowly using a micro inflation syringe device via micro tubing. Balloon was then deflated and retrieved proximally. A control arteriogram performed through the 5 [place] Catalyst guide catheter demonstrated significantly improved caliber and flow through the angioplastied segment into right mid middle cerebral artery distribution. Control arteriogram was then performed at 10 and 20 minutes post the most recent angioplasty. This continued to demonstrate excellent flow through the angioplastied segment. The micro guidewire, and the balloon angioplasty catheter were retrieved and removed. Control arteriogram performed at approximately 35 minutes post the most recent angioplasty demonstrated significantly improved caliber and flow through the right middle cerebral artery distribution. There continued to be a residual approximately 40-50% stenosis in the mid M1 segment. However, having improved flow to the right middle cerebral artery distribution, no placement of an intracranial stent was performed. The patient was loaded with aspirin 81 mg, and Brilinta 180 mg via an orogastric tube. Also the patient was given a total of 4.5 mg of intra-arterial Integrilin to prevent acute aggregation of platelets at the site of the angioplasty. None was noted. A control arteriogram performed through the 8 French Neuron Max sheath in the right common carotid artery demonstrated a TICI 3 revascularization of the right MCA distribution with equivalent hemodynamic flow compared to the anterior cerebral artery distribution. During the procedure, the patient's blood pressure and neurological status remained stable. No evidence of extravasation or mass-effect or midline shift was noted. The 8 [place] Neuron Max sheath was then removed. The right groin sheath was left in situ and connected to  continuous heparinized saline infusion. Distal pulses remained palpable in the dorsalis pedis, and posterior tibial regions bilaterally unchanged. A flat panel CT of the brain demonstrated no evidence of intracranial hemorrhage, or mass effect or midline shift. Patient was extubated and was able to bend the left knee. No significant motor movement was noticeable in the left upper extremity. Patient was then transferred to the neuro ICU to continue with post revascularization management. IMPRESSION: Endovascular revascularization of pre occlusive right middle cerebral artery stenosis with balloon angioplasty as described achieving a TICI 3 revascularization. Residual stenosis of approximately 50% following the balloon angioplasty. PLAN: Follow-up in clinic 4 weeks post discharge. Follow-up CT angiogram of the brain to evaluate the right middle cerebral artery residual stenosis. Patient may need placement of stent in the right middle cerebral artery at the site of angioplasty should the patient continue to have significant stenosis secondary to recoil phenomenon. Electronically Signed  By: [name] M.D.   On: 02/07/2019 15:03   Ct Cerebral Perfusion W Contrast  Result Date: 02/05/2019 CLINICAL DATA:  Stroke. Left-sided weakness. Right-sided watershed infarction by MRI. Small left temporal stroke. EXAM: CT ANGIOGRAPHY HEAD AND NECK CT PERFUSION BRAIN TECHNIQUE: Multidetector CT imaging of the head and neck was performed using the standard protocol during bolus administration of intravenous contrast. Multiplanar CT image reconstructions and MIPs were obtained to evaluate the vascular anatomy. Carotid stenosis measurements (when applicable) are obtained utilizing NASCET criteria, using the distal internal carotid diameter as the denominator. Multiphase CT imaging of the brain was performed following IV bolus contrast injection. Subsequent parametric perfusion maps were calculated using RAPID software.  CONTRAST:  137mL OMNIPAQUE IOHEXOL 350 MG/ML SOLN COMPARISON:  MRI studies same day FINDINGS: CTA NECK FINDINGS Aortic arch: Aortic atherosclerosis. Branching pattern shows atherosclerotic disease. There is motion degradation. I do not suspect a flow limiting origin stenosis. Left vertebral arises directly from the arch. Right carotid system: Common carotid artery widely patent to the bifurcation. Calcified plaque at the carotid bifurcation and proximal ICA bulb. Minimal diameter is 3 mm. Compared to a more distal cervical ICA diameter of 5 mm, this indicates a 40% stenosis. Left carotid system: Common carotid artery is widely patent to the bifurcation region. Calcified plaque at the carotid bifurcation. No stenosis. Vertebral arteries: Right vertebral artery is occluded at the origin. Left vertebral artery originates from the arch. The origin cannot be accurately evaluated. The left vertebral artery is patent through the cervical region to the foramen magnum. There is reconstitution of the right vertebral artery at C1. Skeleton: Ordinary spondylosis. Other neck: No mass or lymphadenopathy. Upper chest: Negative Review of the MIP images confirms the above findings CTA HEAD FINDINGS Anterior circulation: Both internal carotid arteries are patent through the skull base and siphon regions. There is atherosclerotic calcification in both carotid siphon regions without stenosis greater than 30%. The right A1 segment appears widely patent. There is severe stenosis in the right M1 segment with near occlusion. Distal vessels do show good opacification. Both anterior cerebral arteries opacified normally, thanks to the presence of a patent anterior communicating artery. The left A1 segment is occluded. The left M1 segment is widely patent. Left MCA branch vessels show flow. Stenosis at the origin of the left temporal branch. Posterior circulation: Left vertebral artery is widely patent through the foramen magnum to the  basilar. There is retrograde and or reconstituted flow in the distal right vertebral artery. Multiple stenoses are present in the right V4 segment. There is moderate stenosis of the proximal basilar artery, 50%. More distal basilar artery is widely patent. Superior cerebellar and posterior cerebral arteries are patent. There is moderate stenosis at the right P1 P2 junction. Venous sinuses: Patent and normal. Anatomic variants: None other significant. Review of the MIP images confirms the above findings CT Brain Perfusion Findings: ASPECTS: 10 CBF (<30%) Volume: 9mL Perfusion (Tmax>6.0s) volume: 3 ccmL. There is a 110 cc region of T-max greater than 4 seconds in the right middle cerebral artery territory, secondary to the severe M1 stenosis. Mismatch Volume: 61mL Infarction Location:No infarction identified. At risk brain in the posterior parietal region on the right measuring 3 cc. If 1 considers the T-max greater than 4 second brain to be at risk, there is considerable at risk brain throughout the right MCA territory. IMPRESSION: CT angiography confirms the MR findings. Severe stenosis with near occlusion of the right M1 segment. Occlusion of the left A1  segment. Moderate stenosis of the right P1 P2 junction. 40% stenosis at the right carotid bifurcation. Right vertebral artery occluded at its origin. Reconstituted flow and retrograde flow in the distal right vertebral artery. 50% proximal basilar stenosis. No CBF less than 30% identified by this study. Possible reperfusion of the completed infarctions of the deep brain on the right shown by MRI. Small focus of T-max greater than 6 seconds in the right parietal region. 110 cc region of T-max greater than 4 seconds throughout the right middle cerebral artery distribution, indicating at risk brain secondary to the near occlusion of the right MCA. These results were called by telephone at the time of interpretation on 02/05/2019 at 3:47 pm to provider [facility] ,  who verbally acknowledged these results. Electronically Signed   By: Nelson Chimes M.D.   On: 02/05/2019 15:55   Dg Chest Port 1 View  Result Date: 02/07/2019 CLINICAL DATA:  Shortness of breath, wheezing EXAM: PORTABLE CHEST 1 VIEW COMPARISON:  Most recent radiograph 02/04/2019, CT 10/13/2018 FINDINGS: Cleat chronically coarsened interstitial changes are again seen throughout the lungs. There is increasingly coalescent hazy opacity throughout the lower lungs with some interval development of fissural thickening. The cardiac silhouette appears enlarged from prior portable radiograph. No pneumothorax. Some hazy obscuration of the left hemidiaphragm could suggest a trace effusion. No acute osseous or soft tissue abnormality. Degenerative changes are present in the imaged spine and shoulders. IMPRESSION: 1. Increasingly coalescent hazy opacity throughout the lower lungs with some interval development of fissural thickening. Findings could reflect interstitial/alveolar edema superimposed on more chronic interstitial changes. Infection could have a similar appearance and should be excluded clinically. 2. Possible trace left pleural effusion. Electronically Signed   By: Lovena Le M.D.   On: 02/07/2019 01:50   Dg Swallowing Func-speech Pathology  Result Date: 02/06/2019 Objective Swallowing Evaluation: Type of Study: MBS-Modified Barium Swallow Study  Patient Details Name: Christy Carter MRN: [id number] Date of Birth: 12/24/1933 Today's Date: 02/06/2019 Time: SLP Start Time (ACUTE ONLY): 12 -SLP Stop Time (ACUTE ONLY): 1147 SLP Time Calculation (min) (ACUTE ONLY): 17 min Past Medical History: Past Medical History: Diagnosis Date . Arthritis  . Carpal tunnel syndrome  . Complication of anesthesia   Difficulty waking up. Believes it took 2 days to wake up after her surgery. . Depression  . Hyperlipidemia  . Hypertension  . Shortness of breath  . Stroke ([place])  . Thyroid disease  Past Surgical History: Past Surgical  History: Procedure Laterality Date . APPENDECTOMY   . BACK SURGERY   . CHOLECYSTECTOMY   . HERNIA REPAIR   HPI: DALLYS WINDT is a 85 y.o. female PMH of sleep apnea, HTN, HLD, carpal tunnel, stroke who presented to [facility] on 9/24 for SOB. She was admitted by the medical services there, noted to have a pneumonia/UTI. She was noted to have some left sided weakness on initial exam, and again today, with LKW documented to be 0200 02/05/2019. Further imaging was done in the form of MRI of the brain after a CT showed no acute changes.  The MRI brain showed bilateral small acute infarct with the largest being in the deep white matter watershed territory on the right. The neurologist over at [facility] regional discussed with the family who wanted aggressive care and she was transferred directly to the IR suite at [facility].  CXR 9/24: vascular congestion; "Streaky bibasilar opacities, favor atelectasis".  MRI 9/26 concerning for CVA  Subjective: Pt awake, alert, pleasant,  participative Assessment / Plan / Recommendation CHL IP CLINICAL IMPRESSIONS 02/06/2019 Clinical Impression Pt presents with mild oropharyngeal dysphagia c/b decreased labial seal, reduced lingual strength and coordination, premature spillage, delayed swallow initiation, reduced base of tongue retraction, incomplete laryngeal closure, and diminished sensation.  These deficits resulted in piecemeal deglution, BOT and vallecula residue with solids, and penetration of thin and nectar thick liquids.  Penetration occured before swallow 2/2 delayed swallow initiation.  Not all penetration was cleared during swallow.  With pill simulation, penetration of thin liquid increased with penetration reaching the vocal folds.  There was a reflexive cough with this episode.  Penetration appeared to clear; however, pt's positioning and body habitus precluded full and complete visualization of laryngeal vestibule and trachea throughout study.  No aspiration was  observed.  Penetration above the vocal folds noted with nectar thick liquid.  There was no penetration honey thick liquid, puree or solid consistencies. With solid textures there was significant oral residue with spillover of residuals to vallecula without initiation of cleansing swallow.  Oral residue noted on L side during clinical evaluation. Recommend initiation of puree diet with honey thick liquid.  This is a consevative diet recommendation based on incomplete visualization of larynx and trachea during study and pt's inconsistent cough reflex.  Consider FEES for further instrumental evaluation if needed. SLP Visit Diagnosis -- Attention and concentration deficit following -- Frontal lobe and executive function deficit following -- Impact on safety and function Mild aspiration risk   CHL IP TREATMENT RECOMMENDATION 02/06/2019 Treatment Recommendations Therapy as outlined in treatment plan below   Prognosis 02/06/2019 Prognosis for Safe Diet Advancement Good Barriers to Reach Goals -- Barriers/Prognosis Comment -- CHL IP DIET RECOMMENDATION 02/06/2019 SLP Diet Recommendations Dysphagia 1 (Puree) solids;Honey thick liquids Liquid Administration via Cup;No straw Medication Administration Whole meds with puree Compensations Slow rate;Small sips/bites Postural Changes Seated upright at 90 degrees   CHL IP OTHER RECOMMENDATIONS 02/06/2019 Recommended Consults -- Oral Care Recommendations Oral care BID Other Recommendations Order thickener from pharmacy   No flowsheet data found.  CHL IP FREQUENCY AND DURATION 02/06/2019 Speech Therapy Frequency (ACUTE ONLY) min 2x/week Treatment Duration 2 weeks      CHL IP ORAL PHASE 02/06/2019 Oral Phase Impaired Oral - Pudding Teaspoon -- Oral - Pudding Cup -- Oral - Honey Teaspoon -- Oral - Honey Cup Left anterior bolus loss;Right anterior bolus loss;Premature spillage Oral - Nectar Teaspoon -- Oral - Nectar Cup -- Oral - Nectar Straw Premature spillage Oral - Thin Teaspoon -- Oral -  Thin Cup -- Oral - Thin Straw Premature spillage Oral - Puree Premature spillage Oral - Mech Soft -- Oral - Regular Lingual/palatal residue;Decreased bolus cohesion;Delayed oral transit;Piecemeal swallowing Oral - Multi-Consistency -- Oral - Pill Premature spillage Oral Phase - Comment --  CHL IP PHARYNGEAL PHASE 02/06/2019 Pharyngeal Phase Impaired Pharyngeal- Pudding Teaspoon -- Pharyngeal -- Pharyngeal- Pudding Cup -- Pharyngeal -- Pharyngeal- Honey Teaspoon -- Pharyngeal -- Pharyngeal- Honey Cup Delayed swallow initiation-pyriform sinuses Pharyngeal Material does not enter airway Pharyngeal- Nectar Teaspoon -- Pharyngeal -- Pharyngeal- Nectar Cup -- Pharyngeal -- Pharyngeal- Nectar Straw Delayed swallow initiation-vallecula;Reduced airway/laryngeal closure Pharyngeal Material enters airway, remains ABOVE vocal cords and not ejected out Pharyngeal- Thin Teaspoon -- Pharyngeal -- Pharyngeal- Thin Cup -- Pharyngeal -- Pharyngeal- Thin Straw Delayed swallow initiation-pyriform sinuses;Reduced airway/laryngeal closure;Penetration/Aspiration before swallow;Penetration/Aspiration during swallow Pharyngeal Material enters airway, remains ABOVE vocal cords and not ejected out;Material enters airway, CONTACTS cords and then ejected out Pharyngeal- Puree Delayed swallow initiation-vallecula;Reduced pharyngeal peristalsis;Reduced  tongue base retraction Pharyngeal Material does not enter airway Pharyngeal- Mechanical Soft -- Pharyngeal -- Pharyngeal- Regular Delayed swallow initiation-vallecula;Reduced pharyngeal peristalsis;Reduced tongue base retraction;Pharyngeal residue - valleculae Pharyngeal Material does not enter airway Pharyngeal- Multi-consistency -- Pharyngeal -- Pharyngeal- Pill Delayed swallow initiation-vallecula Pharyngeal Material does not enter airway Pharyngeal Comment --  CHL IP CERVICAL ESOPHAGEAL PHASE 02/06/2019 Cervical Esophageal Phase WFL Pudding Teaspoon -- Pudding Cup -- Honey Teaspoon -- Honey Cup  -- Nectar Teaspoon -- Nectar Cup -- Nectar Straw -- Thin Teaspoon -- Thin Cup -- Thin Straw -- Puree -- Mechanical Soft -- Regular -- Multi-consistency -- Pill -- Cervical Esophageal Comment -- Celedonio Savage, MA, CCC-SLP Acute Rehabilitation Services Office: [phone] 02/06/2019, 2:05 PM               Labs:  CBC: Recent Labs    02/03/19 1510 02/04/19 0401 02/05/19 0547 02/06/19 0252  WBC 21.6* 14.5* 9.4 11.5*  HGB 13.4 11.9* 11.5* 12.7  HCT 42.3 37.4 37.1 39.0  PLT 270 243 234 282    COAGS: No results for input(s): INR, APTT in the last 8760 hours.  BMP: Recent Labs    02/05/19 0547 02/06/19 0252 02/07/19 0507 02/08/19 0814  NA 140 139 141 140  K 3.3* 4.1 4.5 4.0  CL 104 105 110 107  CO2 26 23 22  21*  GLUCOSE 103* 163* 109* 84  BUN 30* 21 21 19   CALCIUM 8.4* 8.8* 8.6* 8.7*  CREATININE 0.88 0.98 0.93 0.77  GFRNONAA >60 53* 57* >60  GFRAA >60 >60 >60 >60    LIVER FUNCTION TESTS: Recent Labs    02/11/18 2006 11/25/18 1046  BILITOT 0.6 0.2  AST 21 14  ALT 13 12  ALKPHOS 85 90  PROT 7.4 6.5  ALBUMIN 3.8 3.9    Assessment and Plan:  Right MCA M1 segment pre-occlusive stenosis s/p emergent endovascular revascularization using balloon angioplasty x3 02/05/2019 by Dr. Estanislado Pandy. Patient's condition improving- can spontaneously move right side, can lift LLE off bed, can hold LLE up and give thumbs up/hand grip of LUE. Right groin incision c/d/i, distal pulses palpable bilaterally with Doppler. Continue taking Brilinta 90 mg twice daily and Aspirin 81 mg once daily. Plan to follow-up with Dr. Estanislado Pandy in clinic 4 weeks after discharge. Further plans per neurology- appreciate and agree with management. Please call NIR with questions/concerns.   Electronically Signed: Earley Abide, PA-C 02/08/2019, 12:27 PM   I spent a total of 25 Minutes at the the patient's bedside AND on the patient's hospital floor or unit, greater than 50% of which was  counseling/coordinating care for right MCA M1 segment pre-occlusive stenosis s/p revascularization.

## 2019-02-08 NOTE — Anesthesia Postprocedure Evaluation (Signed)
Anesthesia Post Note  Patient: Christy Carter  Procedure(s) Performed: CODE STROKE (N/A )     Patient location during evaluation: SICU Anesthesia Type: General Level of consciousness: awake and confused Pain management: pain level controlled Vital Signs Assessment: post-procedure vital signs reviewed and stable Respiratory status: spontaneous breathing and respiratory function stable Cardiovascular status: stable Postop Assessment: no apparent nausea or vomiting Anesthetic complications: no    Last Vitals:  Vitals:   02/08/19 0700 02/08/19 0800  BP: (!) 149/50 (!) 177/63  Pulse: 73 90  Resp: 19 (!) 30  Temp:    SpO2: 97% 93%    Last Pain:  Vitals:   02/08/19 0400  TempSrc: Axillary  PainSc: 0-No pain                 Marilla Boddy

## 2019-02-08 NOTE — Progress Notes (Signed)
CXR postponed until morning, pt sleeping after being severely agitated all night. Dr. Rory Percy aware.

## 2019-02-08 NOTE — Progress Notes (Signed)
Physical Therapy Treatment Patient Details Name: Christy Carter MRN: [id number] DOB: January 25, 1934 Today's Date: 02/08/2019    History of Present Illness  Christy Carter is a 85 y.o. female PMH of sleep apnea, HTN, HLD, carpal tunnel, stroke who presented to [facility] on 9/24 for SOB. She was admitted by the medical services there, noted to have a pneumonia/UTI. Pt also found to have L sided weakness. Pt transfered to [facility]. MRI revealed bila small acute infarct with, in deep white matter watershed territory on the R. MRA revealed severe R M1 stensosis, R vertebral occlusion, moderate  to severe R P1 stenosis, severe R P2 stenosis and occluded L A1 in addition to mild/mod L m1 stensosis.  Pt s/p R M1 thrombectomy/anbioplasty.    PT Comments    Pt with significant cognitive improvement. Pt orientedx4, kind, and appropriate. Pt participated in therapy. Pt with noted L sided weakness, wheezing (SPo2 >98% on 4LO2 via [place]), and requiring maxAx2 for EOB and OOB mobility. Pt was indep and living alone PTA. Pt to benefit from CIR upon d/c for maximal functional recovery. Pt with good home set up and support, family can provide 24/7 assist. Acute PT to cont to follow.    Follow Up Recommendations  CIR     Equipment Recommendations  (TBD at next venue)    Recommendations for Other Services Rehab consult     Precautions / Restrictions Precautions Precautions: Fall Precaution Comments: morbidly obese Restrictions Weight Bearing Restrictions: No    Mobility  Bed Mobility Overal bed mobility: Needs Assistance Bed Mobility: Supine to Sit     Supine to sit: Mod assist;+2 for physical assistance     General bed mobility comments: pt initiated LE movement to the L, help required for L LE due to weakness. Pt required maxA for trunk elevation and to scoot to EOB  Transfers Overall transfer level: Needs assistance   Transfers: Sit to/from Stand Sit to Stand: Max assist;+2 physical assistance          General transfer comment: used bed pad to assist with power up, once in stedy pt with strong L Lateral lean, provided tactile cues at L tricep to extend elbow to push self towards midline, however pt unable and required maxA to maintain midlin ewhile in stedy. Pt then required maxAX2 to stand again to sit in chair  Ambulation/Gait             General Gait Details: unable at this time   Stairs             Wheelchair Mobility    Modified Rankin (Stroke Patients Only) Modified Rankin (Stroke Patients Only) Pre-Morbid Rankin Score: No significant disability Modified Rankin: Severe disability     Balance Overall balance assessment: Needs assistance Sitting-balance support: Feet supported;Bilateral upper extremity supported Sitting balance-Leahy Scale: Poor Sitting balance - Comments: pt with strong L lateral lean and required modA to prevent L lateral/posterior lean Postural control: Left lateral lean;Posterior lean     Standing balance comment: dependent on physical assist and equipment                            Cognition Arousal/Alertness: Awake/alert Behavior During Therapy: WFL for tasks assessed/performed Overall Cognitive Status: Impaired/Different from baseline Area of Impairment: Problem solving                             Problem Solving:  Slow processing;Requires verbal cues;Requires tactile cues General Comments: pt much improved compared to yesterday, very polite and kind. followed all commands and tried all tasks asked. pt not irritated or agitated at all. became emotional stating "my children are my everything" , pt very appropriate today      Exercises      General Comments General comments (skin integrity, edema, etc.): VSS      Pertinent Vitals/Pain Pain Assessment: No/denies pain    Home Living                      Prior Function            PT Goals (current goals can now be found in the care plan  section) Progress towards PT goals: Progressing toward goals    Frequency    Min 4X/week      PT Plan Discharge plan needs to be updated    Co-evaluation              AM-PAC PT "6 Clicks" Mobility   Outcome Measure  Help needed turning from your back to your side while in a flat bed without using bedrails?: A Lot Help needed moving from lying on your back to sitting on the side of a flat bed without using bedrails?: A Lot Help needed moving to and from a bed to a chair (including a wheelchair)?: A Lot Help needed standing up from a chair using your arms (e.g., wheelchair or bedside chair)?: A Lot Help needed to walk in hospital room?: Total Help needed climbing 3-5 steps with a railing? : Total 6 Click Score: 10    End of Session Equipment Utilized During Treatment: Gait belt Activity Tolerance: Patient tolerated treatment well Patient left: in chair;with call bell/phone within reach;with family/visitor present Nurse Communication: Mobility status PT Visit Diagnosis: Muscle weakness (generalized) (M62.81);Difficulty in walking, not elsewhere classified (R26.2)     Time: [id number] PT Time Calculation (min) (ACUTE ONLY): 28 min  Charges:  $Therapeutic Activity: 8-22 mins $Neuromuscular Re-education: 8-22 mins                     Kittie Plater, PT, DPT Acute Rehabilitation Services Pager #: [phone] Office #: [phone]    Berline Lopes 02/08/2019, 1:20 PM

## 2019-02-08 NOTE — Progress Notes (Signed)
  Speech Language Pathology Treatment: Dysphagia  Patient Details Name: Christy Carter MRN: [id number] DOB: Feb 16, 1934 Today's Date: 02/08/2019 Time: [id number] SLP Time Calculation (min) (ACUTE ONLY): 35 min  Assessment / Plan / Recommendation Clinical Impression  Pt presenting alert, attentive, and talkative, demonstrating a significant improvement from previous session. Daughter reported pt ate reg/thin while edentitous prior to incident. Honey thick liquids were observed with intermittent strong coughs with both small and moderate sips. Regular solids required longer mastication 2/2 missing dentition but were able to be cleared independently, although pt prefers to take sips of liquid following. Due to pts improved alertness, attention to POs, and success with regular, recommend upgrading from Dys 1 to Dys 3 with Honey thick liquids, meds whole in puree. Full supervision necessary to ensure she is using a slow rate with small bites/sips and lingual sweep for oral residue. SLP will continue tx for toleration of her upgraded diet with secondary MBS soon dependant on pt performance at bedside.    HPI HPI: Christy Carter is a 85 y.o. female PMH of sleep apnea, HTN, HLD, carpal tunnel, stroke who presented to [facility] on 9/24 for SOB. She was admitted by the medical services there, noted to have a pneumonia/UTI. She was noted to have some left sided weakness on initial exam, and again today, with LKW documented to be 0200 02/05/2019. Further imaging was done in the form of MRI of the brain after a CT showed no acute changes.  The MRI brain showed bilateral small acute infarct with the largest being in the deep white matter watershed territory on the right. The neurologist over at [facility] regional discussed with the family who wanted aggressive care and she was transferred directly to the IR suite at [facility].  CXR 9/24: vascular congestion; "Streaky bibasilar opacities, favor atelectasis".  MRI 9/26:       SLP Plan  Continue with current plan of care       Recommendations  Diet recommendations: Dysphagia 3 (mechanical soft);Honey-thick liquid Liquids provided via: Cup Medication Administration: Whole meds with puree Supervision: Full supervision/cueing for compensatory strategies Compensations: Lingual sweep for clearance of pocketing;Small sips/bites;Slow rate Postural Changes and/or Swallow Maneuvers: Seated upright 90 degrees                Oral Care Recommendations: Oral care BID;Patient independent with oral care Follow up Recommendations: 24 hour supervision/assistance SLP Visit Diagnosis: Dysphagia, oropharyngeal phase (R13.12) Plan: Continue with current plan of care                       Tzvi Economou 02/08/2019, 4:23 PM

## 2019-02-08 NOTE — Progress Notes (Signed)
TCD's have been completed. Preliminary results can be found in CV Proc through chart review.   02/08/19 2:34 PM Christy Carter RVT

## 2019-02-08 NOTE — Progress Notes (Signed)
Inpatient Rehabilitation Admissions Coordinator  Inpatient rehab consult received. I will meet with patient for rehab assessment tomorrow.  Danne Baxter, RN, MSN Rehab Admissions Coordinator [phone] 02/08/2019 5:17 PM

## 2019-02-08 NOTE — Progress Notes (Signed)
STROKE TEAM PROGRESS NOTE   INTERVAL HISTORY Patient is doing better today and is more alert and interactive.  She still has left hemiparesis but is able to move left arm and leg off the bed though still weak.  Blood pressure adequately controlled.  No changes overnight.  OBJECTIVE Vitals:   02/08/19 0500 02/08/19 0600 02/08/19 0700 02/08/19 0800  BP: (!) 172/77 (!) 163/61 (!) 149/50 (!) 177/63  Pulse: 92 70 73 90  Resp: (!) 22 (!) 22 19 (!) 30  Temp:    98.1 F (36.7 C)  TempSrc:    Axillary  SpO2: 93% 97% 97% 93%  Weight:      Height:       CBC:  Recent Labs  Lab 02/05/19 0547 02/06/19 0252  WBC 9.4 11.5*  NEUTROABS 6.7 10.6*  HGB 11.5* 12.7  HCT 37.1 39.0  MCV 93.0 92.6  PLT 234 [id number]   Basic Metabolic Panel:  Recent Labs  Lab 02/04/19 0401  02/06/19 0252 02/07/19 0507  NA 145   < > 139 141  K 3.0*   < > 4.1 4.5  CL 107   < > 105 110  CO2 26   < > 23 22  GLUCOSE 106*   < > 163* 109*  BUN 23   < > 21 21  CREATININE 1.00   < > 0.98 0.93  CALCIUM 8.3*   < > 8.8* 8.6*  MG 1.9  --   --   --   PHOS 3.3  --   --   --    < > = values in this interval not displayed.   Lipid Panel:     Component Value Date/Time   CHOL 213 (H) 02/06/2019 0252   CHOL 98 09/07/2013 0358   TRIG 249 (H) 02/07/2019 0507   TRIG 63 09/07/2013 0358   HDL 44 02/06/2019 0252   HDL 43 09/07/2013 0358   CHOLHDL 4.8 02/06/2019 0252   VLDL 30 02/06/2019 0252   VLDL 13 09/07/2013 0358   LDLCALC 139 (H) 02/06/2019 0252   LDLCALC 42 09/07/2013 0358   HgbA1c:  Lab Results  Component Value Date   HGBA1C 5.6 02/06/2019   IMAGING  Ct Angio Head W Or Wo Contrast Ct Angio Neck W Or Wo Contrast Ct Cerebral Perfusion W Contrast 02/05/2019 1. CT angiography confirms the MR findings.  2. Severe stenosis with near occlusion of the right M1 segment.  3. Occlusion of the left A1 segment.  4. Moderate stenosis of the right P1 P2 junction. 40% stenosis at the right carotid bifurcation.  5. Right  vertebral artery occluded at its origin. Reconstituted flow and retrograde flow in the distal right vertebral artery. 50% proximal basilar stenosis.  6. No CBF less than 30% identified by this study. Possible reperfusion of the completed infarctions of the deep brain on the right shown by MRI.  7. Small focus of T-max greater than 6 seconds in the right parietal region. 110 cc region of T-max greater than 4 seconds throughout the right middle cerebral artery distribution, indicating at risk brain secondary to the near occlusion of the right MCA.   Ct Head Wo Contrast 02/05/2019 No change from yesterday Moderate atrophy. Microvascular ischemic changes are present a white matter right greater than left, stable from prior study.   Mr Angio Head Wo Contrast Mr Angio Neck W Wo Contrast Mr Brain Wo Contrast 02/05/2019 1. Acute infarct deep white matter on the right in a watershed territory, likely  due to severe stenosis right M1 segment.  2. Small acute infarct left lateral posterior temporal lobe  3. Carotid bifurcation widely patent in the neck. Mild to moderate stenosis of the left petrous carotid.  4. Left vertebral artery widely patent. Right vertebral artery severely diseased with long segment occlusion and partial reconstitution distally.  5. Moderate to severe stenosis right P1. Severe stenosis right P2 segment. Moderate stenosis left P1 segment.  6. Occluded left A1 segment. Severe stenosis right M1 segment. Mild to moderate stenosis left M1.   Interventional Radiology 02/05/2019 S/balloon angioplasty x 3 of pre occlusive symptomatic rt mca m1 SEG TO IMPROVED CALIBER TO APPROX 70 %.  Transthoracic Echocardiogram  02/03/2019  1. Left ventricular ejection fraction, by visual estimation, is 65 to 70%. The left ventricle has hyperdynamic function. There is no left ventricular hypertrophy.  2. Global right ventricle has normal systolic function.The right ventricular size is normal. No increase in  right ventricular wall thickness.  3. Left atrial size was normal.  4. Right atrial size was normal.  5. The mitral valve is normal in structure. Trace mitral valve regurgitation.  6. The tricuspid valve is normal in structure. Tricuspid valve regurgitation is trivial.  7. The aortic valve is normal in structure. Aortic valve regurgitation was not visualized by color flow Doppler.  8. The pulmonic valve was not well visualized. Pulmonic valve regurgitation was not assessed by color flow Doppler.  9. The aortic root was not well visualized. 10. The interatrial septum was not well visualized.  ECG - ST rate 104   BPM. (See cardiology reading for complete details)   PHYSICAL EXAM     Obese elderly Caucasian lady not in distress . Afebrile. Head is nontraumatic. Neck is supple without bruit.    Cardiac exam no murmur or gallop. Lungs are clear to auscultation. Distal pulses are well felt. Neurological Exam She is awake alert and interactive.  Her speech is dysarthric.  Extraocular movements appear full range without nystagmus.  She blinks to threat bilaterally.  Fundi not visualized.  Face appears symmetric.  Tongue midline.  She has normal strength on the right side.  She has left hemiparesis with 3/5 left upper extremity and 3/5 left lower extremity strength with distal weakness in the left hand and grip and left hip flexors.  Tone is diminished in the left upper extremity.  Sensation slightly diminished on the left upper and lower extremity compared to the right.  Plantars are downgoing.  Gait not tested.  ASSESSMENT/PLAN [name] is a 85 y.o. female with history of sleep apnea, HTN, HLD, carpal tunnel, previous stroke presenting to [facility] with UTI, pneumonia and left sided weakness. She did not receive IV t-PA due to late presentation (>4.5 hours from time of onset). MRI found B small infarcts, largest on the R with severe stenosis throughout. transferred to [facility]  for IR for R M1 stenosis. 02/05/2019 s/p angioplasty x 3 with APPROX 70 % flow return.  Stroke: R watershed territory infarct in setting of severe stenosis right M1 segment s/p IR w/ partial revascularization AND possible small left lateral posterior temporal lobe infarct AND R middle cerebellar peduncle in setting of diffuse intracranial stenosis as likely etiology  Code Stroke CT Head - not ordered  CT head - No change from yesterday Moderate atrophy. Microvascular ischemic changes are present a white matter right greater than left, stable from prior study.   MRI head -  Acute infarct deep white  matter on the right in a watershed territory, likely due to severe stenosis right M1 segment. Small acute infarct left lateral posterior temporal lobe   MRA H&N -  Right vertebral artery severely diseased with long segment occlusion and partial reconstitution distally. Moderate to severe stenosis right P1. Severe stenosis right P2 segment. Moderate stenosis left P1 segment. Occluded left A1 segment. Severe stenosis right M1 segment. Mild to moderate stenosis left M1.   CTA H&N - Severe stenosis with near occlusion of the right M1 segment. Occlusion of the left A1 segment. Moderate stenosis of the right P1 P2 junction. 40% stenosis at the right carotid bifurcation. Right vertebral artery occluded at its origin. Reconstituted flow and retrograde flow in the distal right vertebral artery. 50% proximal basilar stenosis.    CT Perfusion - No CBF less than 30% identified by this study. Possible reperfusion of the completed infarctions of the deep brain on the right shown by MRI. Small focus of T-max greater than 6 seconds in the right parietal region. 110 cc region of T-max greater than 4 seconds throughout the right middle cerebral artery distribution, indicating at risk brain secondary to the near occlusion of the right MCA.   Repeat MRI 9/28 new punctate R temporal occipital lobe infarct. Subtle R middle  cerebellar peduncle abnormality possible stroke. R deep white matter infarct. Small vessel disease. Atrophy. Old R cerebellar lacunes.   Repeat MRA 9/28 persistent R M1 focal stenosis. Occlusion L A1. R VA w/ retrograde flow. Moderate B PCA P1-P2 jxn stenoses  2D Echo - EF 65 - 70%. No cardiac source of emboli identified.   Sars Corona Virus 2 - negative  LDL - 139  HgbA1c - 5.6  VTE prophylaxis - [place] Heparin  aspirin 325 mg daily prior to admission, now on aspirin 81 mg daily and Brilinta  Therapy recommendations:  SNF. Given improvement, will have therapy to reassess for possible CIR.   Disposition:  Pending  Transfer to floor  Agitation/sundowning   Received haldol x 2 last night and also the night prior  Avoid haldol and ativan if possible  Add standing seroquel 12.5 q hs  Hypertension  Home BP meds: Ziac / Maxzide  Lost IV access this am  On clonidine 0.1 patch.  On bisoprolol 2.5, triamterene-HCTZ 37.5-25   SBP goal < 180  . Long-term BP goal normotensive  Hyperlipidemia  Home Lipid lowering medication:  none  LDL 139, goal < 70  On Lipitor 40 mg daily   Continue statin at discharge  Other Stroke Risk Factors  Advanced age  Former cigarette smoker - quit  Obesity, recommend weight loss, diet and exercise as appropriate   Hx stroke/TIA  Obstructive sleep apnea  Dysphagia . Secondary to stroke . Cleared for D1 honey thick liquids . Speech following   Other Active Problems  UTI - UCx 9/24 multiple species. Rocephin 9/26>>9/28 (3 doses)  Mild Leukocytosis - 11.5  (afebrile ) repeat am  Hypokalemia, resolved- 4.0   Audible wheezing during the night. Duoneb given. Added seroquel and gave ativan. CXR increasing hazy opacities lower lungs edema vs interstitial changes. Possible trace L pleural effusion. Repeat CXR with improving opacities, still L pleural effusion. Afebrile. Check CBC am.  Hospital day # 3 Continue mobilization out of bed.   Physical occupational consults.  Inpatient rehab consult.  Transfer to neurology floor bed later today.  Start Seroquel 12.5 mg at night for agitation/sundowning.  Discussed with patient and Dr. Estanislado Pandy and answered questions.  Daughter not available at the bedside for discussion.  Greater than 50% time during this 35-minute visit was spent on counseling and coordination of care and discussion with care team about her plan of care. Christy Contras, MD  Christy Carter, MDTo contact Stroke Continuity provider, please refer to [email or web address] After hours, contact General Neurology

## 2019-02-09 DIAGNOSIS — R1310 Dysphagia, unspecified: Secondary | ICD-10-CM

## 2019-02-09 DIAGNOSIS — R451 Restlessness and agitation: Secondary | ICD-10-CM | POA: Diagnosis present

## 2019-02-09 LAB — BASIC METABOLIC PANEL
Anion gap: 11 (ref 5–15)
BUN: 18 mg/dL (ref 8–23)
CO2: 22 mmol/L (ref 22–32)
Calcium: 8.5 mg/dL — ABNORMAL LOW (ref 8.9–10.3)
Chloride: 105 mmol/L (ref 98–111)
Creatinine, Ser: 0.83 mg/dL (ref 0.44–1.00)
GFR calc Af Amer: >60 mL/min (ref >60)
GFR calc non Af Amer: >60 mL/min (ref >60)
Glucose, Bld: 90 mg/dL (ref 70–99)
Potassium: 3.6 mmol/L (ref 3.5–5.1)
Sodium: 138 mmol/L (ref 135–145)

## 2019-02-09 LAB — CBC
HCT: 39.6 % (ref 36.0–46.0)
Hemoglobin: 12.7 g/dL (ref 12.0–15.0)
MCH: 29.5 pg (ref 26.0–34.0)
MCHC: 32.1 g/dL (ref 30.0–36.0)
MCV: 92.1 fL (ref 80.0–100.0)
Platelets: 327 10*3/uL (ref 150–400)
RBC: 4.3 MIL/uL (ref 3.87–5.11)
RDW: 15.3 % (ref 11.5–15.5)
WBC: 7.3 10*3/uL (ref 4.0–10.5)
nRBC: 0 % (ref 0.0–0.2)

## 2019-02-09 NOTE — Care Management Important Message (Signed)
Important Message  Patient Details  Name: Christy Carter MRN: [id number] Date of Birth: 05/25/1933   Medicare Important Message Given:  Yes     [name] 02/09/2019, 3:12 PM

## 2019-02-09 NOTE — Progress Notes (Signed)
Occupational Therapy Treatment Patient Details Name: Christy Carter MRN: [id number] DOB: October 11, 1933 Today's Date: 02/09/2019    History of present illness  Christy Carter is a 85 y.o. female PMH of sleep apnea, HTN, HLD, carpal tunnel, stroke who presented to [facility] on 9/24 for SOB. She was admitted by the medical services there, noted to have a pneumonia/UTI. Pt also found to have L sided weakness. Pt transfered to [facility] MRI revealed bila small acute infarct with, in deep white matter watershed territory on the R. MRA revealed severe R M1 stensosis, R vertebral occlusion, moderate  to severe R P1 stenosis, severe R P2 stenosis and occluded L A1 in addition to mild/mod L m1 stensosis.  Pt s/p R M1 thrombectomy/anbioplasty.   OT comments  Pt progressing towards acute OT goals. Focus of session was grooming tasks, cognition, and LUE AAROM/PROM exercises. Daughter present. Feel pt would benefit from intensive rehab program at d/c. Good family support and pt is very motivated to work with therapies. D/c recommendation updated to CIR.   Follow Up Recommendations  CIR    Equipment Recommendations  Other (comment)(defer to next venue)    Recommendations for Other Services      Precautions / Restrictions Precautions Precautions: Fall Precaution Comments: morbidly obese Restrictions Weight Bearing Restrictions: No       Mobility Bed Mobility Overal bed mobility: Needs Assistance Bed Mobility: Supine to Sit     Supine to sit: Mod assist;HOB elevated     General bed mobility comments: up in recliner at start of session  Transfers Overall transfer level: Needs assistance   Transfers: Sit to/from Stand           General transfer comment: not attempted this session due to only +1 assist available during session     Balance Overall balance assessment: Needs assistance Sitting-balance support: Feet supported;Bilateral upper extremity supported Sitting balance-Leahy Scale: Poor Sitting  balance - Comments: supported seating for UB tasks Postural control: Left lateral lean     Standing balance comment: dependent on physical assist and equipment                           ADL either performed or assessed with clinical judgement   ADL Overall ADL's : Needs assistance/impaired     Grooming: Wash/dry hands;Wash/dry face;Brushing hair;Maximal assistance;Sitting Grooming Details (indicate cue type and reason): Pt requiring hand over hand to perform. Assist to position comb into L hand. Able to make small combing motion with arm held in supported position of flexion. Assist also for throughness.                               General ADL Comments: Worked on incoporating LUE into grooming tasks and attending to left side.      Vision       Perception     Praxis      Cognition Arousal/Alertness: Awake/alert Behavior During Therapy: WFL for tasks assessed/performed Overall Cognitive Status: Impaired/Different from baseline Area of Impairment: Problem solving                   Current Attention Level: Focused     Safety/Judgement: Decreased awareness of safety;Decreased awareness of deficits Awareness: Intellectual Problem Solving: Slow processing;Requires verbal cues;Requires tactile cues General Comments: WFL for mobility tasks        Exercises Exercises: Other exercises General Exercises - Upper Extremity  Shoulder Flexion: AROM;AAROM;Left;10 reps Elbow Flexion: AROM;10 reps General Exercises - Lower Extremity Ankle Circles/Pumps: AROM;Both Hip Flexion/Marching: AROM;Both;Seated Other Exercises Other Exercises: AAROM/PROM LUE to toleration in supported sitting   Shoulder Instructions       General Comments      Pertinent Vitals/ Pain       Pain Assessment: No/denies pain Faces Pain Scale: Hurts a little bit Pain Location: L shoulder with flexion Pain Descriptors / Indicators: Grimacing;Sore Pain Intervention(s):  Limited activity within patient's tolerance;Monitored during session;Repositioned  Home Living                                          Prior Functioning/Environment              Frequency  Min 3X/week        Progress Toward Goals  OT Goals(current goals can now be found in the care plan section)  Progress towards OT goals: Progressing toward goals  Acute Rehab OT Goals Patient Stated Goal: rehab then home OT Goal Formulation: With patient Time For Goal Achievement: 02/21/19 Potential to Achieve Goals: Good ADL Goals Pt Will Perform Grooming: with min assist;sitting Pt Will Perform Upper Body Dressing: with min assist;sitting Pt Will Transfer to Toilet: with mod assist;stand pivot transfer;bedside commode Pt Will Perform Toileting - Clothing Manipulation and hygiene: with mod assist;sitting/lateral leans;sit to/from stand Additional ADL Goal #1: Pt will perform bed mobility with Min A in prepartion for ADLs Additional ADL Goal #2: Pt will sustain attention to ADL with Mod cues Additional ADL Goal #3: Pt will tolerate sitting at EOB for 5 minutes with Min A in preparation for ADLs  Plan Discharge plan needs to be updated    Co-evaluation                 AM-PAC OT "6 Clicks" Daily Activity     Outcome Measure   Help from another person eating meals?: A Lot Help from another person taking care of personal grooming?: A Lot Help from another person toileting, which includes using toliet, bedpan, or urinal?: A Lot Help from another person bathing (including washing, rinsing, drying)?: A Lot Help from another person to put on and taking off regular upper body clothing?: A Lot Help from another person to put on and taking off regular lower body clothing?: Total 6 Click Score: 11    End of Session    OT Visit Diagnosis: Unsteadiness on feet (R26.81);Other abnormalities of gait and mobility (R26.89);Muscle weakness (generalized) (M62.81);Other  symptoms and signs involving cognitive function   Activity Tolerance Patient limited by fatigue   Patient Left in chair;with call bell/phone within reach;with chair alarm set;with family/visitor present   Nurse Communication          Time: 1251-1315 OT Time Calculation (min): 24 min  Charges: OT General Charges $OT Visit: 1 Visit OT Treatments $Self Care/Home Management : 23-37 mins  Tyrone Schimke, OT Acute Rehabilitation Services Pager: [phone] Office: [phone]   Hortencia Pilar 02/09/2019, 1:31 PM

## 2019-02-09 NOTE — Progress Notes (Signed)
Physical Therapy Treatment Patient Details Name: Christy Carter MRN: [id number] DOB: August 31, 1933 Today's Date: 02/09/2019    History of Present Illness  Christy Carter is a 85 y.o. female PMH of sleep apnea, HTN, HLD, carpal tunnel, stroke who presented to [facility] on 9/24 for SOB. She was admitted by the medical services there, noted to have a pneumonia/UTI. Pt also found to have L sided weakness. Pt transfered to [facility]. MRI revealed bila small acute infarct with, in deep white matter watershed territory on the R. MRA revealed severe R M1 stensosis, R vertebral occlusion, moderate  to severe R P1 stenosis, severe R P2 stenosis and occluded L A1 in addition to mild/mod L m1 stensosis.  Pt s/p R M1 thrombectomy/anbioplasty.    PT Comments    Patient seen for mobility progression. Pt is pleasant and eager to participate in therapy. Pt continues to present with L side weakness and Vertis Kelch utilized for functional transfer training and pre gait activities. Continue to recommend CIR for further skilled PT services to maximize independence and safety with mobility.    Follow Up Recommendations  CIR     Equipment Recommendations  (TBD at next venue)    Recommendations for Other Services Rehab consult     Precautions / Restrictions Precautions Precautions: Fall Precaution Comments: morbidly obese Restrictions Weight Bearing Restrictions: No    Mobility  Bed Mobility Overal bed mobility: Needs Assistance Bed Mobility: Supine to Sit     Supine to sit: Mod assist;HOB elevated     General bed mobility comments: use of rail; cues for sequencing; most assistance needed at hips to scoot to EOB and to elevate trunk into sitting   Transfers Overall transfer level: Needs assistance   Transfers: Sit to/from Stand           General transfer comment: Marketing executive utilized for [employer] transfers given pt's poor sitting balance EOB with L lateral lean; multimodal cues and assist for hip extension especially  when fatigued after transfer to [facility]  Ambulation/Gait                 Stairs             Wheelchair Mobility    Modified Rankin (Stroke Patients Only) Modified Rankin (Stroke Patients Only) Pre-Morbid Rankin Score: No significant disability Modified Rankin: Severe disability     Balance Overall balance assessment: Needs assistance Sitting-balance support: Feet supported;Bilateral upper extremity supported Sitting balance-Leahy Scale: Poor   Postural control: Left lateral lean     Standing balance comment: dependent on physical assist and equipment                            Cognition Arousal/Alertness: Awake/alert Behavior During Therapy: WFL for tasks assessed/performed Overall Cognitive Status: No family/caregiver present to determine baseline cognitive functioning                                 General Comments: WFL for mobility tasks      Exercises General Exercises - Upper Extremity Shoulder Flexion: AROM;AAROM;Left;10 reps Elbow Flexion: AROM;10 reps General Exercises - Lower Extremity Ankle Circles/Pumps: AROM;Both Hip Flexion/Marching: AROM;Both;Seated    General Comments        Pertinent Vitals/Pain Pain Assessment: Faces Faces Pain Scale: Hurts a little bit Pain Location: L shoulder with flexion Pain Descriptors / Indicators: Grimacing;Sore Pain Intervention(s): Limited activity within patient's  tolerance;Monitored during session;Repositioned    Home Living                      Prior Function            PT Goals (current goals can now be found in the care plan section) Progress towards PT goals: Progressing toward goals    Frequency    Min 4X/week      PT Plan Current plan remains appropriate    Co-evaluation              AM-PAC PT "6 Clicks" Mobility   Outcome Measure  Help needed turning from your back to your side while in a flat bed without using bedrails?: A Lot Help  needed moving from lying on your back to sitting on the side of a flat bed without using bedrails?: A Lot Help needed moving to and from a bed to a chair (including a wheelchair)?: A Lot Help needed standing up from a chair using your arms (e.g., wheelchair or bedside chair)?: A Lot Help needed to walk in hospital room?: Total Help needed climbing 3-5 steps with a railing? : Total 6 Click Score: 10    End of Session Equipment Utilized During Treatment: Gait belt Activity Tolerance: Patient tolerated treatment well Patient left: in chair;with call bell/phone within reach;with chair alarm set Nurse Communication: Mobility status PT Visit Diagnosis: Muscle weakness (generalized) (M62.81);Difficulty in walking, not elsewhere classified (R26.2) Pain - Right/Left: Left Pain - part of body: Arm;Hand     Time: [id number] PT Time Calculation (min) (ACUTE ONLY): 53 min  Charges:  $Gait Training: 23-37 mins $Therapeutic Activity: 8-22 mins                     Earney Navy, PTA Acute Rehabilitation Services Pager: [phone] Office: [phone]     Darliss Cheney 02/09/2019, 12:43 PM

## 2019-02-09 NOTE — Progress Notes (Signed)
STROKE TEAM PROGRESS NOTE   INTERVAL HISTORY Patient is doing well  She still has left hemiparesis but is able to move left arm and leg off the bed though still weak.  Blood pressure adequately controlled.  No changes overnight.  OBJECTIVE Vitals:   02/09/19 0500 02/09/19 0819 02/09/19 1216 02/09/19 1553  BP:  (!) 154/56 (!) 158/58 (!) 178/57  Pulse:  79 60 (!) 59  Resp:  16 20 20   Temp:  97.9 F (36.6 C) (!) 97.5 F (36.4 C) (!) 97.4 F (36.3 C)  TempSrc:  Oral Oral Axillary  SpO2:  98% 99% 98%  Weight: 111.9 kg     Height:       CBC:  Recent Labs  Lab 02/05/19 0547 02/06/19 0252 02/09/19 0406  WBC 9.4 11.5* 7.3  NEUTROABS 6.7 10.6*  --   HGB 11.5* 12.7 12.7  HCT 37.1 39.0 39.6  MCV 93.0 92.6 92.1  PLT 234 282 [id number]   Basic Metabolic Panel:  Recent Labs  Lab 02/04/19 0401  02/08/19 0814 02/09/19 0406  NA 145   < > 140 138  K 3.0*   < > 4.0 3.6  CL 107   < > 107 105  CO2 26   < > 21* 22  GLUCOSE 106*   < > 84 90  BUN 23   < > 19 18  CREATININE 1.00   < > 0.77 0.83  CALCIUM 8.3*   < > 8.7* 8.5*  MG 1.9  --   --   --   PHOS 3.3  --   --   --    < > = values in this interval not displayed.   Lipid Panel:     Component Value Date/Time   CHOL 213 (H) 02/06/2019 0252   CHOL 98 09/07/2013 0358   TRIG 249 (H) 02/07/2019 0507   TRIG 63 09/07/2013 0358   HDL 44 02/06/2019 0252   HDL 43 09/07/2013 0358   CHOLHDL 4.8 02/06/2019 0252   VLDL 30 02/06/2019 0252   VLDL 13 09/07/2013 0358   LDLCALC 139 (H) 02/06/2019 0252   LDLCALC 42 09/07/2013 0358   HgbA1c:  Lab Results  Component Value Date   HGBA1C 5.6 02/06/2019   IMAGING  Ct Angio Head W Or Wo Contrast Ct Angio Neck W Or Wo Contrast Ct Cerebral Perfusion W Contrast 02/05/2019 1. CT angiography confirms the MR findings.  2. Severe stenosis with near occlusion of the right M1 segment.  3. Occlusion of the left A1 segment.  4. Moderate stenosis of the right P1 P2 junction. 40% stenosis at the right  carotid bifurcation.  5. Right vertebral artery occluded at its origin. Reconstituted flow and retrograde flow in the distal right vertebral artery. 50% proximal basilar stenosis.  6. No CBF less than 30% identified by this study. Possible reperfusion of the completed infarctions of the deep brain on the right shown by MRI.  7. Small focus of T-max greater than 6 seconds in the right parietal region. 110 cc region of T-max greater than 4 seconds throughout the right middle cerebral artery distribution, indicating at risk brain secondary to the near occlusion of the right MCA.   Ct Head Wo Contrast 02/05/2019 No change from yesterday Moderate atrophy. Microvascular ischemic changes are present a white matter right greater than left, stable from prior study.   Mr Angio Head Wo Contrast Mr Angio Neck W Wo Contrast Mr Brain Wo Contrast 02/05/2019 1. Acute infarct deep white  matter on the right in a watershed territory, likely due to severe stenosis right M1 segment.  2. Small acute infarct left lateral posterior temporal lobe  3. Carotid bifurcation widely patent in the neck. Mild to moderate stenosis of the left petrous carotid.  4. Left vertebral artery widely patent. Right vertebral artery severely diseased with long segment occlusion and partial reconstitution distally.  5. Moderate to severe stenosis right P1. Severe stenosis right P2 segment. Moderate stenosis left P1 segment.  6. Occluded left A1 segment. Severe stenosis right M1 segment. Mild to moderate stenosis left M1.   Interventional Radiology 02/05/2019 S/balloon angioplasty x 3 of pre occlusive symptomatic rt mca m1 SEG TO IMPROVED CALIBER TO APPROX 70 %.  Transthoracic Echocardiogram  02/03/2019  1. Left ventricular ejection fraction, by visual estimation, is 65 to 70%. The left ventricle has hyperdynamic function. There is no left ventricular hypertrophy.  2. Global right ventricle has normal systolic function.The right  ventricular size is normal. No increase in right ventricular wall thickness.  3. Left atrial size was normal.  4. Right atrial size was normal.  5. The mitral valve is normal in structure. Trace mitral valve regurgitation.  6. The tricuspid valve is normal in structure. Tricuspid valve regurgitation is trivial.  7. The aortic valve is normal in structure. Aortic valve regurgitation was not visualized by color flow Doppler.  8. The pulmonic valve was not well visualized. Pulmonic valve regurgitation was not assessed by color flow Doppler.  9. The aortic root was not well visualized. 10. The interatrial septum was not well visualized.  ECG - ST rate 104   BPM. (See cardiology reading for complete details)   PHYSICAL EXAM     Obese elderly Caucasian lady not in distress . Afebrile. Head is nontraumatic. Neck is supple without bruit.    Cardiac exam no murmur or gallop. Lungs are clear to auscultation. Distal pulses are well felt. Neurological Exam She is awake alert and interactive.  Her speech is dysarthric.  Extraocular movements appear full range without nystagmus.  She blinks to threat bilaterally.  Fundi not visualized.  Face appears symmetric.  Tongue midline.  She has normal strength on the right side.  She has left hemiparesis with 3/5 left upper extremity and 3/5 left lower extremity strength with distal weakness in the left hand and grip and left hip flexors.  Tone is diminished in the left upper extremity.  Sensation slightly diminished on the left upper and lower extremity compared to the right.  Plantars are downgoing.  Gait not tested.  ASSESSMENT/PLAN [name] is a 85 y.o. female with history of sleep apnea, HTN, HLD, carpal tunnel, previous stroke presenting to [facility] with UTI, pneumonia and left sided weakness. She did not receive IV t-PA due to late presentation (>4.5 hours from time of onset). MRI found B small infarcts, largest on the R with  severe stenosis throughout. transferred to [facility] for IR for R M1 stenosis. 02/05/2019 s/p angioplasty x 3 with APPROX 70 % flow return.  Stroke: R watershed territory infarct in setting of severe stenosis right M1 segment s/p IR w/ partial revascularization AND possible small left lateral posterior temporal lobe infarct AND R middle cerebellar peduncle in setting of diffuse intracranial stenosis as likely etiology  Code Stroke CT Head - not ordered  CT head - No change from yesterday Moderate atrophy. Microvascular ischemic changes are present a white matter right greater than left, stable from prior study.  MRI head -  Acute infarct deep white matter on the right in a watershed territory, likely due to severe stenosis right M1 segment. Small acute infarct left lateral posterior temporal lobe   MRA H&N -  Right vertebral artery severely diseased with long segment occlusion and partial reconstitution distally. Moderate to severe stenosis right P1. Severe stenosis right P2 segment. Moderate stenosis left P1 segment. Occluded left A1 segment. Severe stenosis right M1 segment. Mild to moderate stenosis left M1.   CTA H&N - Severe stenosis with near occlusion of the right M1 segment. Occlusion of the left A1 segment. Moderate stenosis of the right P1 P2 junction. 40% stenosis at the right carotid bifurcation. Right vertebral artery occluded at its origin. Reconstituted flow and retrograde flow in the distal right vertebral artery. 50% proximal basilar stenosis.    CT Perfusion - No CBF less than 30% identified by this study. Possible reperfusion of the completed infarctions of the deep brain on the right shown by MRI. Small focus of T-max greater than 6 seconds in the right parietal region. 110 cc region of T-max greater than 4 seconds throughout the right middle cerebral artery distribution, indicating at risk brain secondary to the near occlusion of the right MCA.   Repeat MRI 9/28 new punctate R  temporal occipital lobe infarct. Subtle R middle cerebellar peduncle abnormality possible stroke. R deep white matter infarct. Small vessel disease. Atrophy. Old R cerebellar lacunes.   Repeat MRA 9/28 persistent R M1 focal stenosis. Occlusion L A1. R VA w/ retrograde flow. Moderate B PCA P1-P2 jxn stenoses  2D Echo - EF 65 - 70%. No cardiac source of emboli identified.   [place] Corona Virus 2 - negative  LDL - 139  HgbA1c - 5.6  VTE prophylaxis - [place] Heparin  aspirin 325 mg daily prior to admission, now on aspirin 81 mg daily and Brilinta  Therapy recommendations:  SNF. Given improvement, will have therapy to reassess for possible CIR.   Disposition:  Pending  Transfer to floor  Agitation/sundowning   Received haldol x 2 last night and also the night prior  Avoid haldol and ativan if possible  Add standing seroquel 12.5 q hs  Hypertension  Home BP meds: Ziac / Maxzide  Lost IV access this am  On clonidine 0.1 patch.  On bisoprolol 2.5, triamterene-HCTZ 37.5-25   SBP goal < 180  . Long-term BP goal normotensive  Hyperlipidemia  Home Lipid lowering medication:  none  LDL 139, goal < 70  On Lipitor 40 mg daily   Continue statin at discharge  Other Stroke Risk Factors  Advanced age  Former cigarette smoker - quit  Obesity, recommend weight loss, diet and exercise as appropriate   Hx stroke/TIA  Obstructive sleep apnea  Dysphagia . Secondary to stroke . Cleared for D1 honey thick liquids . Speech following   Other Active Problems  UTI - UCx 9/24 multiple species. Rocephin 9/26>>9/28 (3 doses)  Mild Leukocytosis - 11.5  (afebrile ) repeat am  Hypokalemia, resolved- 4.0   Audible wheezing during the night. Duoneb given. Added seroquel and gave ativan. CXR increasing hazy opacities lower lungs edema vs interstitial changes. Possible trace L pleural effusion. Repeat CXR with improving opacities, still L pleural effusion. Afebrile. Check CBC  am.  Hospital day # 4 Patient appears medically stable to be transferred to inpatient rehab later today when bed available and after insurance approval. Antony Contras, MD  Antony Contras, MDTo contact Stroke Continuity provider, please refer to  [email or web address] After hours, contact General Neurology

## 2019-02-09 NOTE — Discharge Summary (Addendum)
Stroke Discharge Summary  Patient ID: Christy Carter   MRN: [id number]      DOB: 11/02/1933  Date of Admission: 02/05/2019 Date of Discharge: 02/11/2019  Attending Physician:  Garvin Fila, MD, Stroke MD Consultant(s):   Wyman Songster, MD (Interventional Neuroradiologist), pulmonary/intensive care Patient's PCP:  Lavera Guise, MD  Discharge Diagnoses:  Principal Problem:   Stroke (cerebrum) ([place]) - R watershed d/t large vessel dz & small L temp lobe & R cerebellar infarcts Active Problems:   SLEEP APNEA, OBSTRUCTIVE   Essential hypertension   Obesity   Mixed hyperlipidemia   Middle cerebral artery stenosis, right   Agitation requiring sedation protocol   Dysphagia  Past Medical History:  Diagnosis Date  . Arthritis   . Carpal tunnel syndrome   . Complication of anesthesia    Difficulty waking up. Believes it took 2 days to wake up after her surgery.  . Depression   . Hyperlipidemia   . Hypertension   . Shortness of breath   . Stroke ([place])   . Thyroid disease    Past Surgical History:  Procedure Laterality Date  . APPENDECTOMY    . BACK SURGERY    . CHOLECYSTECTOMY    . HERNIA REPAIR    . IR CT HEAD LTD  02/05/2019  . IR PTA INTRACRANIAL  02/05/2019  . RADIOLOGY WITH ANESTHESIA N/A 02/05/2019   Procedure: CODE STROKE;  Surgeon: Radiologist, Medication, MD;  Location: [facility];  Service: Radiology;  Laterality: N/A;    Medications to be continued on Rehab Allergies as of 02/11/2019   No Known Allergies     Medication List    STOP taking these medications   aspirin 325 MG tablet Replaced by: aspirin 81 MG EC tablet   celecoxib 200 MG capsule Commonly known as: CELEBREX   gabapentin 100 MG capsule Commonly known as: NEURONTIN   ibuprofen 200 MG tablet Commonly known as: ADVIL   nitrofurantoin (macrocrystal-monohydrate) 100 MG capsule Commonly known as: MACROBID   Pneumovax 23 25 MCG/0.5ML injection Generic drug: pneumococcal 23 valent  vaccine     TAKE these medications   allopurinol 100 MG tablet Commonly known as: ZYLOPRIM Take 2 tabs at bed time for gout   aspirin 81 MG EC tablet Take 1 tablet (81 mg total) by mouth daily. Replaces: aspirin 325 MG tablet   atorvastatin 40 MG tablet Commonly known as: LIPITOR Take 1 tablet (40 mg total) by mouth daily at 6 PM.   B-12 2500 MCG Tabs Take 2,500 mcg by mouth daily. As needed for lethargy   bisoprolol-hydrochlorothiazide 2.5-6.25 MG tablet Commonly known as: ZIAC Take 1 tablet by mouth daily.   Chlorhexidine Gluconate Cloth 2 % Pads Apply 6 each topically daily.   cloNIDine 0.1 mg/24hr patch Commonly known as: CATAPRES - Dosed in mg/24 hr Place 1 patch (0.1 mg total) onto the skin every Monday. Start taking on: February 14, 2019   colchicine 0.6 MG tablet Take 1 tablet (0.6 mg total) by mouth 2 (two) times daily.   heparin 5000 UNIT/ML injection Inject 1 mL (5,000 Units total) into the skin every 8 (eight) hours.   HYDROcodone-acetaminophen 5-325 MG tablet Commonly known as: NORCO/VICODIN Take 1 tablet by mouth 3 (three) times daily as needed for moderate pain.   levothyroxine 100 MCG tablet Commonly known as: SYNTHROID Take 1 tablet (100 mcg total) by mouth daily. What changed: when to take this   omeprazole 40 MG capsule Commonly known  as: PRILOSEC Take 1 capsule (40 mg total) by mouth daily.   potassium chloride 10 MEQ tablet Commonly known as: KLOR-CON Take 10 mEq by mouth 2 (two) times daily.   QUEtiapine 25 MG tablet Commonly known as: SEROQUEL Take 0.5 tablets (12.5 mg total) by mouth at bedtime.   Resource ThickenUp Clear Powd Take 120 g by mouth as needed (for nectar thick liquids).   senna-docusate 8.6-50 MG tablet Commonly known as: Senokot-S Take 1 tablet by mouth at bedtime.   sodium chloride 0.9 % infusion Inject 75 mLs into the vein continuous.   ticagrelor 90 MG Tabs tablet Commonly known as: BRILINTA Take 1 tablet  (90 mg total) by mouth 2 (two) times daily.   triamterene-hydrochlorothiazide 37.5-25 MG tablet Commonly known as: MAXZIDE-25 TAKE 1 TABLET BY MOUTH EVERY DAY FOR BLOOD PRESSURE   Ventolin HFA 108 (90 Base) MCG/ACT inhaler Generic drug: albuterol TAKE 2 PUFFS BY MOUTH EVERY 6 HOURS AS NEEDED FOR WHEEZE OR SHORTNESS OF BREATH   Vitamin D (Ergocalciferol) 1.25 MG (50000 UT) Caps capsule Commonly known as: DRISDOL Take 1 capsule (50,000 Units total) by mouth every 7 (seven) days.   vitamin E 400 UNIT capsule Take 400 Units by mouth daily.       LABORATORY STUDIES CBC    Component Value Date/Time   WBC 7.3 02/09/2019 0406   RBC 4.30 02/09/2019 0406   HGB 12.7 02/09/2019 0406   HGB 15.2 12/15/2017 1039   HCT 39.6 02/09/2019 0406   HCT 43.4 12/15/2017 1039   PLT 327 02/09/2019 0406   PLT 254 12/15/2017 1039   MCV 92.1 02/09/2019 0406   MCV 88 12/15/2017 1039   MCV 93 11/22/2013 1637   MCH 29.5 02/09/2019 0406   MCHC 32.1 02/09/2019 0406   RDW 15.3 02/09/2019 0406   RDW 13.9 12/15/2017 1039   RDW 13.9 11/22/2013 1637   LYMPHSABS 0.5 (L) 02/06/2019 0252   LYMPHSABS 1.9 12/15/2017 1039   LYMPHSABS 2.1 09/06/2013 1544   MONOABS 0.3 02/06/2019 0252   MONOABS 0.8 09/06/2013 1544   EOSABS 0.0 02/06/2019 0252   EOSABS 0.3 12/15/2017 1039   EOSABS 0.3 09/06/2013 1544   BASOSABS 0.0 02/06/2019 0252   BASOSABS 0.1 12/15/2017 1039   BASOSABS 0.0 09/06/2013 1544   CMP    Component Value Date/Time   NA 138 02/09/2019 0406   NA 143 11/25/2018 1046   NA 140 11/22/2013 1637   K 3.6 02/09/2019 0406   K 3.4 (L) 11/22/2013 1637   CL 105 02/09/2019 0406   CL 106 11/22/2013 1637   CO2 22 02/09/2019 0406   CO2 25 11/22/2013 1637   GLUCOSE 90 02/09/2019 0406   GLUCOSE 105 (H) 11/22/2013 1637   BUN 18 02/09/2019 0406   BUN 34 (H) 11/25/2018 1046   BUN 35 (H) 11/22/2013 1637   CREATININE 0.83 02/09/2019 0406   CREATININE 1.51 (H) 11/22/2013 1637   CALCIUM 8.5 (L) 02/09/2019  0406   CALCIUM 8.3 (L) 11/22/2013 1637   PROT 6.5 11/25/2018 1046   PROT 6.4 11/22/2013 1637   ALBUMIN 3.9 11/25/2018 1046   ALBUMIN 3.4 11/22/2013 1637   AST 14 11/25/2018 1046   AST 16 11/22/2013 1637   ALT 12 11/25/2018 1046   ALT 18 11/22/2013 1637   ALKPHOS 90 11/25/2018 1046   ALKPHOS 81 11/22/2013 1637   BILITOT 0.2 11/25/2018 1046   BILITOT 0.4 11/22/2013 1637   GFRNONAA >60 02/09/2019 0406   GFRNONAA 33 (L) 11/22/2013 1637  GFRAA >60 02/09/2019 0406   GFRAA 38 (L) 11/22/2013 1637   COAGS Lab Results  Component Value Date   INR 0.9 09/06/2013   Lipid Panel    Component Value Date/Time   CHOL 213 (H) 02/06/2019 0252   CHOL 98 09/07/2013 0358   TRIG 249 (H) 02/07/2019 0507   TRIG 63 09/07/2013 0358   HDL 44 02/06/2019 0252   HDL 43 09/07/2013 0358   CHOLHDL 4.8 02/06/2019 0252   VLDL 30 02/06/2019 0252   VLDL 13 09/07/2013 0358   LDLCALC 139 (H) 02/06/2019 0252   LDLCALC 42 09/07/2013 0358   HgbA1C  Lab Results  Component Value Date   HGBA1C 5.6 02/06/2019   Urinalysis    Component Value Date/Time   COLORURINE YELLOW (A) 02/03/2019 1122   APPEARANCEUR CLOUDY (A) 02/03/2019 1122   APPEARANCEUR Cloudy (A) 01/12/2019 0911   LABSPEC 1.014 02/03/2019 1122   LABSPEC 1.018 11/22/2013 1637   PHURINE 5.0 02/03/2019 1122   GLUCOSEU NEGATIVE 02/03/2019 1122   GLUCOSEU Negative 11/22/2013 1637   HGBUR SMALL (A) 02/03/2019 1122   BILIRUBINUR NEGATIVE 02/03/2019 1122   BILIRUBINUR negative 02/02/2019 1033   BILIRUBINUR Negative 01/12/2019 0911   BILIRUBINUR Negative 11/22/2013 1637   KETONESUR 5 (A) 02/03/2019 1122   PROTEINUR NEGATIVE 02/03/2019 1122   UROBILINOGEN 0.2 02/02/2019 1033   NITRITE NEGATIVE 02/03/2019 1122   LEUKOCYTESUR SMALL (A) 02/03/2019 1122   LEUKOCYTESUR Negative 11/22/2013 1637    SIGNIFICANT DIAGNOSTIC STUDIES Ct Head Wo Contrast 02/03/2019 Atrophy, chronic microvascular disease. No acute intracranial abnormality.  Ct Head Wo  Contrast 02/05/2019 No change from yesterday Moderate atrophy. Microvascular ischemic changes are present a white matter right greater than left, stable from prior study.   Mr Angio Head Wo Contrast Mr Angio Neck W Wo Contrast Mr Brain Wo Contrast 02/05/2019 1. Acute infarct deep white matter on the right in a watershed territory, likely due to severe stenosis right M1 segment.  2. Small acute infarct left lateral posterior temporal lobe  3. Carotid bifurcation widely patent in the neck. Mild to moderate stenosis of the left petrous carotid.  4. Left vertebral artery widely patent. Right vertebral artery severely diseased with long segment occlusion and partial reconstitution distally.  5. Moderate to severe stenosis right P1. Severe stenosis right P2 segment. Moderate stenosis left P1 segment.  6. Occluded left A1 segment. Severe stenosis right M1 segment. Mild to moderate stenosis left M1.   Ct Angio Head W Or Wo Contrast Ct Angio Neck W Or Wo Contrast Ct Cerebral Perfusion W Contrast 02/05/2019 1. CT angiography confirms the MR findings.  2. Severe stenosis with near occlusion of the right M1 segment.  3. Occlusion of the left A1 segment.  4. Moderate stenosis of the right P1 P2 junction. 40% stenosis at the right carotid bifurcation.  5. Right vertebral artery occluded at its origin. Reconstituted flow and retrograde flow in the distal right vertebral artery. 50% proximal basilar stenosis.  6. No CBF less than 30% identified by this study. Possible reperfusion of the completed infarctions of the deep brain on the right shown by MRI.  7. Small focus of T-max greater than 6 seconds in the right parietal region. 110 cc region of T-max greater than 4 seconds throughout the right middle cerebral artery distribution, indicating at risk brain secondary to the near occlusion of the right MCA.   Interventional Radiology 02/05/2019 S/balloon angioplasty x 3 of pre occlusive symptomatic rt mca  m1 SEG TO IMPROVED CALIBER  TO APPROX 70 %.  Mr Brain Wo Contrast 02/07/2019 1. Significantly motion degraded and limited examination. 2. New punctate acute/early subacute infarct within the right temporal occipital lobe.  3. 11 mm focus of subtle apparent restricted diffusion within the right middle cerebellar peduncle. An acute/early subacute infarct cannot be excluded, although the diffusion abnormality is subtle and this may reflect artifact.  4. Persistent restricted diffusion at site of subacute deep white matter infarct on the right.  5. Atrophy and chronic small vessel ischemic disease. Redemonstrated chronic right cerebellar lacunar infarcts.  Mr Angio Head Wo Contrast 02/07/2019 1. Significantly motion degraded examination. 2. Persistent apparent severe focal stenosis within the M1 right middle cerebral artery. 3. Redemonstrated occlusion of the A1 left anterior cerebral artery. 4. Suspected retrograde flow within the intracranial right vertebral artery. The right cervical vertebral artery was a occluded on recent prior CTA. 5. Moderate stenoses within the bilateral posterior cerebral arteries at the P1-P2 junctions.  Transthoracic Echocardiogram  02/03/2019  1. Left ventricular ejection fraction, by visual estimation, is 65 to 70%. The left ventricle has hyperdynamic function. There is no left ventricular hypertrophy.  2. Global right ventricle has normal systolic function.The right ventricular size is normal. No increase in right ventricular wall thickness.  3. Left atrial size was normal.  4. Right atrial size was normal.  5. The mitral valve is normal in structure. Trace mitral valve regurgitation.  6. The tricuspid valve is normal in structure. Tricuspid valve regurgitation is trivial.  7. The aortic valve is normal in structure. Aortic valve regurgitation was not visualized by color flow Doppler.  8. The pulmonic valve was not well visualized. Pulmonic valve regurgitation  was not assessed by color flow Doppler.  9. The aortic root was not well visualized. 10. The interatrial septum was not well visualized.  CXR 02/08/2019 Interval improvement in diffuse interstitial infiltrates, likely due to decreased interstitial edema. No significant change in bibasilar atelectasis and probable small left pleural effusion. 02/07/2019 1. Increasingly coalescent hazy opacity throughout the lower lungs with some interval development of fissural thickening. Findings could reflect interstitial/alveolar edema superimposed on more chronic interstitial changes. Infection could have a similar appearance and should be excluded clinically. 2. Possible trace left pleural effusion.  02/04/2019 1. Stable exam from the previous day's study. 2. Vascular congestion with interstitial prominence. No evidence of pneumonia. 02/03/2019 1. Mild pulmonary vascular congestion. 2. Streaky bibasilar opacities, favor atelectasis.  ECG - ST rate 104   BPM. (See cardiology reading for complete details)     HISTORY OF PRESENT ILLNESS MOZEL PSENCIK is a 85 y.o. female PMH of sleep apnea, HTN, HLD, carpal tunnel, stroke who presented to [facility] on 02/03/2019 for SOB. She was admitted by the medical services there, noted to have a pneumonia/UTI. She was noted to have some left sided weakness on initial exam, and again today 02/05/2019, with LKW documented to be 0200 02/05/2019. She was not a tPA candidate as she was out of the window. Neurology - Dr. Doy Mince evaluated patient at [facility], [place] [facility]. Pt had a prior stroke that affected the left side but had no residual deficits. modified Rankin of 1 at baseline. CT head showed no acute changes.  The MRI brain showed bilateral small acute infarct with the largest being in the deep white matter watershed territory on the right.  It was presumed that the infarcts were embolic in etiology but the MRA showed a severe right M1  stenosis,  right vertebral occlusion, moderate to severe right P1 stenosis, severe right P2 stenosis and an occluded left A1 along with mild to moderate left M1 stenosis.  Dr. Doy Mince, neurologist at [facility] discussed with the family, who wanted aggressive care and she was transferred directly to the IR suite at [facility] for a possible right M1 thrombectomy/angioplasty. Dr. Estanislado Pandy did angioplasty to the right MCA M1 segment 3 times prior to obtaining a good flow.  Post IR she was still under the effect of anesthesia not able to provide any history. She was extubated and transferred to the neuro ICU.   HOSPITAL COURSE Ms. AGELIKI WESTLAKE is a 85 y.o. female with history of sleep apnea, HTN, HLD, carpal tunnel, previous stroke presenting to [facility] with UTI, pneumonia and left sided weakness. She did not receive IV t-PA due to late presentation (>4.5 hours from time of onset). MRI found B small infarcts, largest on the R with severe stenosis throughout. transferred to [facility] for IR for R M1 stenosis. 02/05/2019 s/p angioplasty x 3 with APPROX 70 % flow return.  Stroke: R watershed territory infarct in setting of severe stenosis right M1 segment s/p IR w/ partial revascularization AND possible small left lateral posterior temporal lobe infarct AND R middle cerebellar peduncle in setting of diffuse intracranial stenosis as likely etiology  Code Stroke CT Head - not ordered  CT head - No change from yesterday Moderate atrophy. Microvascular ischemic changes are present a white matter right greater than left, stable from prior study.   MRI head -  Acute infarct deep white matter on the right in a watershed territory, likely due to severe stenosis right M1 segment. Small acute infarct left lateral posterior temporal lobe   MRA H&N -  Right vertebral artery severely diseased with long segment occlusion and partial reconstitution distally. Moderate to  severe stenosis right P1. Severe stenosis right P2 segment. Moderate stenosis left P1 segment. Occluded left A1 segment. Severe stenosis right M1 segment. Mild to moderate stenosis left M1.   CTA H&N - Severe stenosis with near occlusion of the right M1 segment. Occlusion of the left A1 segment. Moderate stenosis of the right P1 P2 junction. 40% stenosis at the right carotid bifurcation. Right vertebral artery occluded at its origin. Reconstituted flow and retrograde flow in the distal right vertebral artery. 50% proximal basilar stenosis.    CT Perfusion - No CBF less than 30% identified by this study. Possible reperfusion of the completed infarctions of the deep brain on the right shown by MRI. Small focus of T-max greater than 6 seconds in the right parietal region. 110 cc region of T-max greater than 4 seconds throughout the right middle cerebral artery distribution, indicating at risk brain secondary to the near occlusion of the right MCA.   Repeat MRI 9/28 new punctate R temporal occipital lobe infarct. Subtle R middle cerebellar peduncle abnormality possible stroke. R deep white matter infarct. Small vessel disease. Atrophy. Old R cerebellar lacunes.   Repeat MRA 9/28 persistent R M1 focal stenosis. Occlusion L A1. R VA w/ retrograde flow. Moderate B PCA P1-P2 jxn stenoses  2D Echo - EF 65 - 70%. No cardiac source of emboli identified.   Sars Corona Virus 2 - negative  LDL - 139  HgbA1c - 5.6  VTE prophylaxis - [place] Heparin  aspirin 325 mg daily prior to admission, now on aspirin 81 mg daily and Brilinta  Therapy recommendations:  CIR  Disposition:  CIR  Agitation/sundowning   Received haldol x 2 nights in a row  Now on standing seroquel 12.5 q hs  Stable   Avoid haldol and ativan   Hypertension  Home BP meds: Ziac / Maxzide  On clonidine 0.1 patch - d/c patch as BP improves (started when pt NPO and lost IV)  On bisoprolol 2.5, triamterene-HCTZ 37.5-25   SBP at goal  < 180   Long-term BP goal normotensive  Hyperlipidemia  Home Lipid lowering medication:  none  LDL 139, goal < 70  On Lipitor 40 mg daily   Continue statin at discharge  Other Stroke Risk Factors  Advanced age  Former cigarette smoker - quit  Obesity, recommend weight loss, diet and exercise as appropriate   Hx stroke/TIA  Obstructive sleep apnea  Dysphagia  Secondary to stroke  Cleared for D1 nectar  thick liquids  Speech following   Other Active Problems  UTI - UCx 9/24 multiple species. Rocephin 9/26>>9/28 (3 doses)  Mild Leukocytosis, resolved  - 11.5  (afebrile ) 7.3  Hypokalemia, resolved  Audible wheezing during the night. Duoneb given. Added seroquel and gave ativan. CXR increasing hazy opacities lower lungs edema vs interstitial changes. Possible trace L pleural effusion. Repeat CXR with improving opacities, still L pleural effusion. Afebrile. CBC normal.  DISCHARGE EXAM Blood pressure (!) 141/55, pulse 62, temperature 97.8 F (36.6 C), temperature source Oral, resp. rate 16, height 5\' 6"  (1.676 m), weight 111.9 kg, SpO2 99 %. Obese elderly Caucasian lady not in distress . Afebrile. Head is nontraumatic. Neck is supple without bruit.    Cardiac exam no murmur or gallop. Lungs are clear to auscultation. Distal pulses are well felt. Neurological Exam She is awake alert and interactive.  Her speech is dysarthric.  Extraocular movements appear full range without nystagmus.  She blinks to threat bilaterally.  Fundi not visualized.  Face appears symmetric.  Tongue midline.  She has normal strength on the right side.  She has left hemiparesis with 3/5 left upper extremity and 3/5 left lower extremity strength with distal weakness in the left hand and grip and left hip flexors.  Tone is diminished in the left upper extremity.  Sensation slightly diminished on the left upper and lower extremity compared to the right. Plantars are downgoing.  Gait not  tested.   Discharge Diet  Dysphagia 3 nectar thick liquids  DISCHARGE PLAN  Disposition:  Transfer to [facility] for ongoing PT, OT and ST  aspirin 81 mg daily and Brilinta (ticagrelor) 90 mg bid for secondary stroke prevention   Recommend ongoing stroke risk factor control by Primary Care Physician at time of discharge from inpatient rehabilitation.  Follow-up Lavera Guise, MD in 2 weeks following discharge from rehab.  Follow-up in [facility] in 4 weeks following discharge from rehab, office to schedule an appointment.   Follow-up [name] [facility]) Estanislado Pandy, MD (Interventional Neuroradiologist) in 4 weeks following discharge from rehab, office to schedule an appointment.   40 minutes were spent preparing discharge.  Burnetta Sabin, MSN, APRN, ANVP-BC, AGPCNP-BC Advanced Practice Stroke Nurse [place] for Schedule & Pager information 02/11/2019 11:51 AM  I have personally obtained history,examined this patient, reviewed notes, independently viewed imaging studies, participated in medical decision making and plan of care.ROS completed by me personally and pertinent positives fully documented  I have made any additions or clarifications directly to the above note. Agree with note above.    Tavaras Goody  Leonie Man, [facility] Pager: [phone] 02/11/2019 1:37 PM

## 2019-02-09 NOTE — Progress Notes (Signed)
OT Cancellation Note  Patient Details Name: Christy Carter MRN: [id number] DOB: [birth year]   Cancelled Treatment:    Reason Eval/Treat Not Completed: Other (comment)(currently meeting with Rehab Admissions Coordinator). Plan to reattempt.  Tyrone Schimke, OT Acute Rehabilitation Services Pager: [phone] Office: [phone]  02/09/2019, 11:53 AM

## 2019-02-09 NOTE — Consult Note (Signed)
Inpatient Rehabilitation Admissions Coordinator  Inpatient rehab consult received. I met with patient with her daughter, Earlie Server at bedside for rehab assessment. We discussed goals and expectations of an inpt rehab admission. They prefer an inpt rehab admit rather than SNF. I will begin insurance approval with [facility] Medicare after I receive updated OT assessment today for a possible admit pending insurance approval.  Danne Baxter, RN, MSN Rehab Admissions Coordinator [phone] 02/09/2019 12:04 PM

## 2019-02-09 NOTE — Progress Notes (Signed)
  Speech Language Pathology Treatment: Dysphagia  Patient Details Name: Christy Carter MRN: [id number] DOB: [birth year] Today's Date: 02/09/2019 Time: 1200-1220 SLP Time Calculation (min) (ACUTE ONLY): 20 min  Assessment / Plan / Recommendation Clinical Impression  Pt upright in chair, daughter visiting. Pts speech 100% intelligible today, mildly dysarthric. Sustained attention and participated appropriately in social conversation. Needed verbal and tactile cues for basic functional problem solving and awareness. Pt verbalized dislike of honey thick liquids and also a desire to use straws for increased access to self feeding with a lidded cup. Trialed thin liquids with a straw with small single sips (cued). This elicited immediately coughing in at least three out of 6 trials. Transitioned to nectar thick liquids via straw with no coughing and pt taking sips independently. Will upgrade diet to dys 3/nectar thick liquids and f/u for tolerance.   HPI HPI: Christy Carter is a 85 y.o. female PMH of sleep apnea, HTN, HLD, carpal tunnel, stroke who presented to [facility] on 9/24 for SOB. She was admitted by the medical services there, noted to have a pneumonia/UTI. She was noted to have some left sided weakness on initial exam, and again today, with LKW documented to be 0200 02/05/2019. Further imaging was done in the form of MRI of the brain after a CT showed no acute changes.  The MRI brain showed bilateral small acute infarct with the largest being in the deep white matter watershed territory on the right. The neurologist over at [facility] regional discussed with the family who wanted aggressive care and she was transferred directly to the IR suite at [facility].  CXR 9/24: vascular congestion; "Streaky bibasilar opacities, favor atelectasis".        SLP Plan  Continue with current plan of care       Recommendations  Diet recommendations: Nectar-thick liquid;Dysphagia 3 (mechanical soft) Liquids  provided via: Straw;Cup Medication Administration: Whole meds with puree Supervision: Full supervision/cueing for compensatory strategies Compensations: Lingual sweep for clearance of pocketing;Small sips/bites;Slow rate Postural Changes and/or Swallow Maneuvers: Seated upright 90 degrees                Oral Care Recommendations: Oral care BID Follow up Recommendations: Inpatient Rehab;24 hour supervision/assistance SLP Visit Diagnosis: Cognitive communication deficit [id number]) Plan: Continue with current plan of care       GO              Herbie [place], MA [place] Pager [phone] Office [phone]   Lynann Beaver 02/09/2019, 2:06 PM

## 2019-02-10 NOTE — PMR Pre-admission (Signed)
PMR Admission Coordinator Pre-Admission Assessment  Patient: Christy Carter is an 85 y.o., female MRN: [id number] DOB: 1933-09-13 Height: _0  (167.6 cm) Weight: 111.9 kg  Insurance Information HMO: yes    PPO:      PCP:      IPA:      80/20:      OTHER: medicare advantage plan PRIMARY: [place] dual complete       Policy#: [id number]      Subscriber: pt CM Name: Christy Carter      Phone#: [phone]     Fax#: portal Pre-Cert#: [id number]    Approved for 7 days with f/u optum CM Christy Carter phone [phone] fax [phone]  Employer:  Benefits:  Phone #: [phone]     Name: 9/30 Eff. Date: 10/11/2018     Deduct: none      Out of Pocket Max: [phone]      Life Max: none CIR: $250 co pay per admit if no Medicaid      SNF: no co pay days 1 until 20; $176 co pay per day days 21 until 100 Outpatient: no co pay      Co-Pay: visits per medical neccesity Home Health: 1005      Co-Pay: visits per medical neccesity DME: 80%     Co-Pay: 205 Providers: in network  SECONDARY: Medicaid      Policy#: [id number]      Subscriber: pt 10/1 per passport one source not active for QMB; I discussed with daughter that it looks like Medicaid not currently active  Medicaid Application Date:       Case Manager:  Disability Application Date:       Case Worker:   The "Data Collection Information Summary" for patients in Inpatient Rehabilitation Facilities with attached "Privacy Act [place] Records" was provided and verbally reviewed with: Patient and Family  Emergency Contact Information Contact Information    Name Relation Home Work Mobile   Carter,Christy Daughter [phone]     Carter, Christy [phone]        Current Medical History  Patient Admitting Diagnosis: CVA  History of Present Illness: 85 year old right-handed female with history of hyperlipidemia, , CVA maintained on aspirin, hypertension, gout.  She quit smoking 26 years ago.   Presented 02/02/2021 [facility] with  recent fall as well as reports of some memory issues for the past 2 to 3 weeks.  Noted shortness of breath with noted hypoxia oxygen saturations 84%.  Urinalysis positive nitrite, chest x-ray mild pulmonary vascular congestion, cranial CT scan negative for acute changes, WBC 15,800, troponin negative, CK 327, lactic acid within normal limits, COVID negative, blood cultures no growth to date.  Patient was placed on Rocephin for UTI as well as suspected pneumonia.  Neurology consulted 02/05/2019 for reports of left-sided weakness.  Cranial CT scan repeated 02/05/2019 showing patchy hypodensity in the white matter right greater than left.  No midline shift.  MRI as well as MRA of head and neck showed acute infarct deep white matter on the right and a watershed territory likely due to severe stenosis of M1 segment.  Small acute infarct left lateral posterior temporal lobe.  Occluded left A1 segment.  Severe stenosis right M1 segment.  Patient was transferred to [facility] underwent balloon angioplasty x3 of pre occlusive symptomatic right MCA M1 segment per interventional radiology.  Echocardiogram with ejection fraction of 70% without emboli.  Follow-up MRI after angioplasty showed new punctate acute early subacute infarct within the  right temporal occipital lobe.  Repeat MRSA 02/07/2019 persistent right M1 focal stenosis.  Patient presently on low-dose aspirin 81 mg daily as well as Brilinta for CVA prophylaxis.  Subcutaneous heparin for DVT prophylaxis.  Dysphasia #3 nectar thick liquid diet.  Hospital course bouts of restlessness and agitation she was placed on low-dose nightly Seroquel.  Latest follow-up urine study culture multi-species Rocephin has been completed.  Leukocytosis improved 7.3.    Complete NIHSS TOTAL: 8  Patient's medical record from [facility]  has been reviewed by the rehabilitation admission coordinator and physician.  Past Medical History  Past Medical History:  Diagnosis  Date  . Arthritis   . Carpal tunnel syndrome   . Complication of anesthesia    Difficulty waking up. Believes it took 2 days to wake up after her surgery.  . Depression   . Hyperlipidemia   . Hypertension   . Shortness of breath   . Stroke ([place])   . Thyroid disease     Family History   family history includes Diverticulitis in her mother; Heart disease in her father and mother; Hypertension in her father and mother.  Prior Rehab/Hospitalizations Has the patient had prior rehab or hospitalizations prior to admission? Yes  Has the patient had major surgery during 100 days prior to admission? No   Current Medications  Current Facility-Administered Medications:  .  0.9 %  sodium chloride infusion, , Intravenous, Continuous, Carter, Christy L, NP, Last Rate: 75 mL/hr at 02/10/19 2117 .  0.9 %  sodium chloride infusion, , Intravenous, PRN, Christy Starch, NP .  acetaminophen (TYLENOL) tablet 650 mg, 650 mg, Oral, Q4H PRN, 650 mg at 02/11/19 1020 **OR** acetaminophen (TYLENOL) solution 650 mg, 650 mg, Per Tube, Q4H PRN **OR** acetaminophen (TYLENOL) suppository 650 mg, 650 mg, Rectal, Q4H PRN, Carter, Christy L, NP .  aspirin EC tablet 81 mg, 81 mg, Oral, Daily, Carter, Christy L, NP, 81 mg at 02/11/19 1008 .  atorvastatin (LIPITOR) tablet 40 mg, 40 mg, Oral, q1800, Carter, Christy L, NP, 40 mg at 02/10/19 1708 .  bisoprolol (ZEBETA) tablet 2.5 mg, 2.5 mg, Oral, Daily, Carter, Christy L, NP, 2.5 mg at 02/11/19 1006 .  Chlorhexidine Gluconate Cloth 2 % PADS 6 each, 6 each, Topical, Daily, Christy Starch, NP, 6 each at 02/10/19 0933 .  cloNIDine (CATAPRES - Dosed in mg/24 hr) patch 0.1 mg, 0.1 mg, Transdermal, Q Mon, Carter, Christy L, NP, 0.1 mg at 02/07/19 0934 .  heparin injection 5,000 Units, 5,000 Units, Subcutaneous, Q8H, Carter, Christy L, NP, 5,000 Units at 02/11/19 0556 .  ipratropium-albuterol (DUONEB) 0.5-2.5 (3) MG/3ML nebulizer solution 3 mL, 3 mL, Nebulization, Q6H PRN, Carter, Christy L, NP, 3 mL at  02/09/19 0323 .  levothyroxine (SYNTHROID) tablet 100 mcg, 100 mcg, Oral, QAC breakfast, Carter, Christy L, NP, 100 mcg at 02/11/19 0556 .  pantoprazole (PROTONIX) EC tablet 80 mg, 80 mg, Oral, Daily, Carter, Christy L, NP, 80 mg at 02/11/19 1006 .  potassium chloride (K-DUR) CR tablet 10 mEq, 10 mEq, Oral, BID, Carter, Christy L, NP, 10 mEq at 02/11/19 1006 .  QUEtiapine (SEROQUEL) tablet 12.5 mg, 12.5 mg, Oral, QHS, Carter, Christy L, NP, 12.5 mg at 02/10/19 2120 .  [employer], , Oral, PRN, Christy Starch, NP .  senna-docusate (Senokot-S) tablet 1 tablet, 1 tablet, Oral, QHS, Carter, Christy L, NP, 1 tablet at 02/10/19 1708 .  ticagrelor (BRILINTA) tablet 90 mg, 90 mg, Oral, BID, 90 mg at  02/11/19 1007 **OR** ticagrelor (BRILINTA) tablet 90 mg, 90 mg, Per Tube, BID, Carter, Christy L, NP, 90 mg at 02/08/19 1007 .  triamterene-hydrochlorothiazide (MAXZIDE-25) 37.5-25 MG per tablet 1 tablet, 1 tablet, Per Tube, Daily, Carter, Christy L, NP, 1 tablet at 02/11/19 1006  Patients Current Diet:  Diet Order            DIET DYS 3 Room service appropriate? No; Fluid consistency: Nectar Thick  Diet effective now              Precautions / Restrictions Precautions Precautions: Fall Precaution Comments: morbidly obese Restrictions Weight Bearing Restrictions: No   Has the patient had 2 or more falls or a fall with injury in the past year? No  Prior Activity Level Community (5-7x/wk): Mod I with RW, active  Prior Functional Level Self Care: Did the patient need help bathing, dressing, using the toilet or eating? Independent  Indoor Mobility: Did the patient need assistance with walking from room to room (with or without device)? Independent  Stairs: Did the patient need assistance with internal or external stairs (with or without device)? Independent  Functional Cognition: Did the patient need help planning regular tasks such as shopping or remembering to take medications? [place] / [place] Devices/Equipment: Eyeglasses Home Equipment: Walker - 4 wheels, Walker - 2 wheels  Prior Device Use: Indicate devices/aids used by the patient prior to current illness, exacerbation or injury? Walker  Current Functional Level Cognition  Arousal/Alertness: Awake/alert Overall Cognitive Status: Impaired/Different from baseline Current Attention Level: Focused Orientation Level: (P) Oriented to person, Oriented to place, Oriented to situation, Disoriented to time Following Commands: Follows one step commands with increased time Safety/Judgement: Decreased awareness of safety, Decreased awareness of deficits General Comments: WFL for mobility tasks Attention: Sustained Sustained Attention: Appears intact Memory: Impaired Memory Impairment: Decreased recall of new information, Decreased short term memory Awareness: Impaired Awareness Impairment: Intellectual impairment Problem Solving: Impaired Problem Solving Impairment: Functional basic Safety/Judgment: Impaired    Extremity Assessment (includes Sensation/Coordination)  Upper Extremity Assessment: LUE deficits/detail RUE Deficits / Details: Limited active movement seen at RUE. Once back in bed, pt rubbing her stomach with RUe and bringing her right hand to her face.  LUE Deficits / Details: 2/5 grossly.  LUE Coordination: decreased fine motor, decreased gross motor  Lower Extremity Assessment: Defer to PT evaluation RLE Deficits / Details: pt with no voluntary movement, pt holding LE in extension and resisting ROM, in sitting EOB pt bent knees and was able to achieve full knee/ankle PROM LLE Deficits / Details: pt fighting ROM in bed, no active movement, able to achieve full PROM to knee/ankle in sitting    ADLs  Overall ADL's : Needs assistance/impaired Eating/Feeding: Maximal assistance, Bed level Grooming: Wash/dry hands, Wash/dry face, Brushing hair, Maximal assistance,  Sitting Grooming Details (indicate cue type and reason): Pt requiring hand over hand to perform. Assist to position comb into L hand. Able to make small combing motion with arm held in supported position of flexion. Assist also for throughness. General ADL Comments: Worked on incoporating LUE into grooming tasks and attending to left side.     Mobility  Overal bed mobility: Needs Assistance Bed Mobility: Supine to Sit Supine to sit: Max assist, HOB elevated Sit to supine: Max assist, +2 for physical assistance General bed mobility comments: cues for sequencing and assistance to bring hips to EOB and to reach bed rail going toward L side and assist to elevate  trunk into sitting    Transfers  Overall transfer level: Needs assistance Transfer via Lift Equipment: [place] Transfers: Sit to/from Stand Sit to Stand: Max assist, +2 physical assistance, Mod assist General transfer comment: max A +2 from EOB and mod A +2 from [place] seat; +2 assist with use of bed pad to power up into standing     Ambulation / Gait / Stairs / Wheelchair Mobility  Ambulation/Gait General Gait Details: unable at this time    Posture / Balance Dynamic Sitting Balance Sitting balance - Comments: supported seating for UB tasks Balance Overall balance assessment: Needs assistance Sitting-balance support: Single extremity supported, Feet supported Sitting balance-Leahy Scale: Poor Sitting balance - Comments: supported seating for UB tasks Postural control: Left lateral lean Standing balance comment: dependent on physical assist and equipment    Special needs/care consideration BiPAP/CPAP n/a CPM  N/a Continuous Drip IV  N/a Dialysis n/a Life Vest  N/a Oxygen  N/a Special Bed  N/a Trach Size  N/a Wound Vac n/a Skin  Ecchymosis to right arm and flank, and bilateral LES  Bowel mgmt:  Incontinent LBM 10/1 Bladder mgmt: external catheter Diabetic mgmt:  N/a Behavioral consideration patient confused since admit;  not her baseline per daughter Chemo/radiation  N/a Designated visitor  Daughter, Earlie Server   Previous Home Environment  Living Arrangements: Alone  Lives With: Alone Available Help at Discharge: (daughter, Earlie Server will provide 24/7 assist) Type of Home: House Home Layout: One level Home Access: Ramped entrance Bathroom Shower/Tub: Chiropodist: Standard Bathroom Accessibility: Yes How Accessible: Accessible via walker Home Care Services: No Additional Comments: (they live seperately but would provide assist)  Discharge Living Setting Plans for Discharge Living Setting: Patient's home, Alone Type of Home at Discharge: House Discharge Home Layout: One level Discharge Home Access: [place] entrance Discharge Bathroom Shower/Tub: Tub/shower unit, Curtain Discharge Bathroom Toilet: Standard Discharge Bathroom Accessibility: Yes How Accessible: Accessible via walker Does the patient have any problems obtaining your medications?: No  Social/Family/Support Systems Patient Roles: Parent Contact Information: Christy, daughter Anticipated Caregiver: daughter and son Anticipated Caregiver's Contact Information: see above Ability/Limitations of Caregiver: no limitations Caregiver Availability: 24/7 Discharge Plan Discussed with Primary Caregiver: Yes Is Caregiver In Agreement with Plan?: Yes Does Caregiver/Family have Issues with Lodging/Transportation while Pt is in Rehab?: No  Goals/Additional Needs Patient/Family Goal for Rehab: supervision to min with PT and OT, supervision with SLP Expected length of stay: ELOS 14 to 20 days Additional Information: Patietn alert and oriented pta; very confused since CVA per daughter Pt/Family Agrees to Admission and willing to participate: Yes Program Orientation Provided & Reviewed with Pt/Caregiver Including Roles  & Responsibilities: Yes  Decrease burden of Care through IP rehab admission: n/a  Possible need for SNF  placement upon discharge:  Not anticipated  Patient Condition: I have reviewed medical records from [facility] , spoken with CM, and patient and daughter. I met with patient at the bedside for inpatient rehabilitation assessment.  Patient will benefit from ongoing PT, OT and SLP, can actively participate in 3 hours of therapy a day 5 days of the week, and can make measurable gains during the admission.  Patient will also benefit from the coordinated team approach during an Inpatient Acute Rehabilitation admission.  The patient will receive intensive therapy as well as Rehabilitation physician, nursing, social worker, and care management interventions.  Due to bladder management, bowel management, safety, skin/wound care, disease management, medication administration, pain management and patient education the patient requires 7  hour a day rehabilitation nursing.  The patient is currently mod to max assist with mobility and basic ADLs.  Discharge setting and therapy post discharge at home with home health is anticipated.  Patient has agreed to participate in the Acute Inpatient Rehabilitation Program and will admit today.  Preadmission Screen Completed By:  Cleatrice Burke, 02/11/2019 10:59 AM ______________________________________________________________________   Discussed status with Dr. Naaman Plummer  on  02/11/2019 at  1059 and received approval for admission today.  Admission Coordinator:  Cleatrice Burke, RN, time 1059 Date 02/11/2019  Assessment/Plan: Diagnosis: right watershed infarct with left hemiparesis 1. Does the need for close, 24 hr/day Medical supervision in concert with the patient's rehab needs make it unreasonable for this patient to be served in a less intensive setting? Yes 2. Co-Morbidities requiring supervision/potential complications: uti, htn, dysphagia 3. Due to bladder management, bowel management, safety, skin/wound care, disease management, medication  administration, pain management and patient education, does the patient require 24 hr/day rehab nursing? Yes 4. Does the patient require coordinated care of a physician, rehab nurse, PT (1-2 hrs/day, 5 days/week), OT (1-2 hrs/day, 5 days/week) and SLP (1-2 hrs/day, 5 days/week) to address physical and functional deficits in the context of the above medical diagnosis(es)? Yes Addressing deficits in the following areas: balance, endurance, locomotion, strength, transferring, bowel/bladder control, bathing, dressing, feeding, grooming, toileting, cognition, speech, swallowing and psychosocial support 5. Can the patient actively participate in an intensive therapy program of at least 3 hrs of therapy 5 days a week? Yes 6. The potential for patient to make measurable gains while on inpatient rehab is excellent 7. Anticipated functional outcomes upon discharge from inpatients are: supervision and min assist PT, supervision and min assist OT, supervision SLP 8. Estimated rehab length of stay to reach the above functional goals is: 14-20 days 9. Anticipated D/C setting: Home 10. Anticipated post D/C treatments: [place] therapy 11. Overall Rehab/Functional Prognosis: excellent  MD Signature: Meredith Staggers, MD, [place] [facility] 02/11/2019

## 2019-02-10 NOTE — H&P (Signed)
Physical Medicine and Rehabilitation Admission H&P    Chief complaint: Weakness HPI: Christy Carter is a 85 year old right-handed female with history of hyperlipidemia, , CVA maintained on aspirin, hypertension, gout.  She quit smoking 26 years ago.  Per chart review patient lives alone.  Independent with assistive device.  1 level home with ramped entrance.  Daughter and son provide assistance as needed.  Presented 02/02/2021 [facility] with recent fall as well as reports of some memory issues for the past 2 to 3 weeks.  Noted shortness of breath with noted hypoxia oxygen saturations 84%.  Urinalysis positive nitrite, chest x-ray mild pulmonary vascular congestion, cranial CT scan negative for acute changes, WBC 15,800, troponin negative, CK 327, lactic acid within normal limits, COVID negative, blood cultures no growth to date.  Patient was placed on Rocephin for UTI as well as suspected pneumonia.  Neurology consulted 02/05/2019 for reports of left-sided weakness.  Cranial CT scan repeated 02/05/2019 showing patchy hypodensity in the white matter right greater than left.  No midline shift.  MRI as well as MRA of head and neck showed acute infarct deep white matter on the right and a watershed territory likely due to severe stenosis of M1 segment.  Small acute infarct left lateral posterior temporal lobe.  Occluded left A1 segment.  Severe stenosis right M1 segment.  Patient was transferred to [facility] underwent balloon angioplasty x3 of preocclusive symptomatic right MCA M1 segment per interventional radiology.  Echocardiogram with ejection fraction of 70% without emboli.  Follow-up MRI after angioplasty showed new punctate acute early subacute infarct within the right temporal occipital lobe.  Repeat MRSA 02/07/2019 persistent right M1 focal stenosis.  Patient presently on low-dose aspirin 81 mg daily as well as Brilinta for CVA prophylaxis.  Subcutaneous heparin for DVT prophylaxis.  Dysphasia #3  nectar thick liquid diet.  Hospital course bouts of restlessness and agitation she was placed on low-dose nightly Seroquel.  Latest follow-up urine study culture multi-species Rocephin has been completed.  Leukocytosis improved 7.3.  Therapy evaluations completed and patient was admitted for a comprehensive rehab program  Review of Systems  Constitutional: Negative for chills and fever.  HENT: Negative for hearing loss.   Eyes: Negative for blurred vision and double vision.  Respiratory: Positive for shortness of breath. Negative for cough.   Cardiovascular: Positive for leg swelling. Negative for chest pain and palpitations.  Gastrointestinal: Positive for constipation. Negative for heartburn and nausea.  Genitourinary: Negative for dysuria, flank pain and hematuria.  Musculoskeletal: Positive for back pain and falls.  Skin: Negative for rash.  Psychiatric/Behavioral: Positive for depression. The patient has insomnia.   All other systems reviewed and are negative.  Past Medical History:  Diagnosis Date  . Arthritis   . Carpal tunnel syndrome   . Complication of anesthesia    Difficulty waking up. Believes it took 2 days to wake up after her surgery.  . Depression   . Hyperlipidemia   . Hypertension   . Shortness of breath   . Stroke ([place])   . Thyroid disease    Past Surgical History:  Procedure Laterality Date  . APPENDECTOMY    . BACK SURGERY    . CHOLECYSTECTOMY    . HERNIA REPAIR    . IR PTA INTRACRANIAL  02/05/2019  . RADIOLOGY WITH ANESTHESIA N/A 02/05/2019   Procedure: CODE STROKE;  Surgeon: Radiologist, Medication, MD;  Location: [place];  Service: Radiology;  Laterality: N/A;   Family History  Problem  Relation Age of Onset  . Heart disease Mother   . Hypertension Mother   . Diverticulitis Mother   . Heart disease Father   . Hypertension Father    Social History:  reports that she quit smoking about 26 years ago. Her smoking use included cigarettes. She has never  used smokeless tobacco. She reports that she does not drink alcohol or use drugs. Allergies: No Known Allergies Medications Prior to Admission  Medication Sig Dispense Refill  . allopurinol (ZYLOPRIM) 100 MG tablet Take 2 tabs at bed time for gout 60 tablet 6  . aspirin 325 MG tablet Take 1 tablet (325 mg total) by mouth daily. 30 tablet 0  . bisoprolol-hydrochlorothiazide (ZIAC) 2.5-6.25 MG tablet Take 1 tablet by mouth daily. 30 tablet 3  . celecoxib (CELEBREX) 200 MG capsule Take 1 capsule (200 mg total) by mouth daily. 30 capsule 3  . colchicine 0.6 MG tablet Take 1 tablet (0.6 mg total) by mouth 2 (two) times daily. 10 tablet 1  . Cyanocobalamin (B-12) 2500 MCG TABS Take 2,500 mcg by mouth daily. As needed for lethargy    . gabapentin (NEURONTIN) 100 MG capsule Take 1 tab po twice a day for gout 60 capsule 0  . HYDROcodone-acetaminophen (NORCO/VICODIN) 5-325 MG tablet Take 1 tablet by mouth 3 (three) times daily as needed for moderate pain. 15 tablet 0  . ibuprofen (ADVIL) 200 MG tablet Take 200 mg by mouth every 6 (six) hours as needed.    Marland Kitchen levothyroxine (SYNTHROID, LEVOTHROID) 100 MCG tablet Take 1 tablet (100 mcg total) by mouth daily. (Patient taking differently: Take 100 mcg by mouth daily before breakfast. ) 90 tablet 3  . nitrofurantoin, macrocrystal-monohydrate, (MACROBID) 100 MG capsule Take 1 capsule (100 mg total) by mouth 2 (two) times daily. 14 capsule 0  . omeprazole (PRILOSEC) 40 MG capsule Take 1 capsule (40 mg total) by mouth daily. 90 capsule 2  . pneumococcal 23 valent vaccine (PNEUMOVAX 23) 25 MCG/0.5ML injection Inject 0.38ml IM once 0.5 mL 0  . potassium chloride (K-DUR) 10 MEQ tablet Take 10 mEq by mouth 2 (two) times daily.    Marland Kitchen triamterene-hydrochlorothiazide (MAXZIDE-25) 37.5-25 MG tablet TAKE 1 TABLET BY MOUTH EVERY DAY FOR BLOOD PRESSURE 90 tablet 1  . VENTOLIN HFA 108 (90 Base) MCG/ACT inhaler TAKE 2 PUFFS BY MOUTH EVERY 6 HOURS AS NEEDED FOR WHEEZE OR SHORTNESS  OF BREATH 18 Inhaler 1  . Vitamin D, Ergocalciferol, (DRISDOL) 1.25 MG (50000 UT) CAPS capsule Take 1 capsule (50,000 Units total) by mouth every 7 (seven) days. 10 capsule 0  . vitamin E 400 UNIT capsule Take 400 Units by mouth daily.      Drug Regimen Review Drug regimen was reviewed and remains appropriate with no significant issues identified  Home: Home Living Family/patient expects to be discharged to:: Private residence Living Arrangements: Alone Available Help at Discharge: (daughter, Earlie Server will provide 24/7 assist) Type of Home: House Home Access: Ramped entrance Home Layout: One level Bathroom Shower/Tub: Chiropodist: Standard Bathroom Accessibility: Yes Home Equipment: Environmental consultant - 4 wheels, Walker - 2 wheels Additional Comments: (they live seperately but would provide assist)  Lives With: Alone   Functional History: Prior Function Level of Independence: Independent with assistive device(s) Comments: heats with wood, would bring in her own wood, doesn't drive but would grocery shop herself via her dtr or son providing her a ride, uses a RW  Functional Status:  Mobility: Bed Mobility Overal bed mobility: Needs Assistance Bed  Mobility: Supine to Sit Supine to sit: Max assist, HOB elevated Sit to supine: Max assist, +2 for physical assistance General bed mobility comments: cues for sequencing and assistance to bring hips to EOB and to reach bed rail going toward L side and assist to elevate trunk into sitting Transfers Overall transfer level: Needs assistance Transfer via Lift Equipment: Stedy Transfers: Sit to/from Stand Sit to Stand: Max assist, +2 physical assistance, Mod assist General transfer comment: max A +2 from EOB and mod A +2 from [place] seat; +2 assist with use of bed pad to power up into standing  Ambulation/Gait General Gait Details: unable at this time    ADL: ADL Overall ADL's : Needs assistance/impaired Eating/Feeding:  Maximal assistance, Bed level Grooming: Wash/dry hands, Wash/dry face, Brushing hair, Maximal assistance, Sitting Grooming Details (indicate cue type and reason): Pt requiring hand over hand to perform. Assist to position comb into L hand. Able to make small combing motion with arm held in supported position of flexion. Assist also for throughness. General ADL Comments: Worked on incoporating LUE into grooming tasks and attending to left side.   Cognition: Cognition Overall Cognitive Status: Impaired/Different from baseline Arousal/Alertness: Awake/alert Orientation Level: Oriented to person, Oriented to place, Disoriented to time, Disoriented to situation Attention: Sustained Sustained Attention: Appears intact Memory: Impaired Memory Impairment: Decreased recall of new information, Decreased short term memory Awareness: Impaired Awareness Impairment: Intellectual impairment Problem Solving: Impaired Problem Solving Impairment: Functional basic Safety/Judgment: Impaired Cognition Arousal/Alertness: Awake/alert(drowsy) Behavior During Therapy: WFL for tasks assessed/performed Overall Cognitive Status: Impaired/Different from baseline Area of Impairment: Problem solving, Following commands Orientation Level: Disoriented to, Place, Time, Situation Current Attention Level: Focused Following Commands: Follows one step commands with increased time Safety/Judgement: Decreased awareness of safety, Decreased awareness of deficits Awareness: Intellectual Problem Solving: Slow processing, Requires verbal cues, Requires tactile cues, Difficulty sequencing General Comments: WFL for mobility tasks  Physical Exam: Blood pressure (!) 157/94, pulse 76, temperature 98.8 F (37.1 C), temperature source Oral, resp. rate 17, height 5\' 6"  (1.676 m), weight 111.9 kg, SpO2 96 %. Physical Exam  Constitutional: No distress.  obese  HENT:  Head: Normocephalic and atraumatic.  Eyes: Pupils are equal,  round, and reactive to light. EOM are normal.  Neck: Normal range of motion. No tracheal deviation present. No thyromegaly present.  Cardiovascular: Normal rate and regular rhythm.  Murmur heard. Respiratory: Effort normal. No respiratory distress. She has no wheezes.  GI: Soft. She exhibits no distension. There is no abdominal tenderness.  Musculoskeletal:        General: Edema present.  Neurological:  Pt lying in bed, daughter trying to feed food to her. Retaining bites of food in mouth, occasionally coughing on remnants. Oriented to person, place. Remembered me from yesterday. Still appears fatigued. RUE and RLE grossly 4- to 4/5. LUE and LLE 2/5, mild left inattention. Speech dysarthric. Sensed gross touch, decreased pain left arm and leg.    .  Skin: Skin is warm and dry.  A few scattered ecchymoses  Psychiatric:  Flat but cooperative, fatigued    No results found for this or any previous visit (from the past 48 hour(s)). No results found.     Medical Problem List and Plan: 1.  Left hemiplegia with dysphagia/dysarthria secondary to right watershed territory infarction in the setting of severe stenosis right M1 segment status post revascularization as well as small left lateral posterior temporal lobe infarct in right middle cerebellar peduncle infarct  -admit to inpatient rehab  -ELOS  14-20+ days, min assist to supervision goals 2.  Antithrombotics: -DVT/anticoagulation: Subcutaneous heparin  -antiplatelet therapy: Aspirin 81 mg daily, Brilinta 90 mg twice daily 3. Pain Management: Tylenol as needed 4. Mood: Seroquel 12.5 mg nightly  -antipsychotic agents: N/A 5. Neuropsych: This patient is intermittentlhy capable of making decisions on her own behalf. 6. Skin/Wound Care: Routine skin checks 7. Fluids/Electrolytes/Nutrition: Routine in and outs with follow-up chemistries 8.  Dysphagia.  Dysphasia #3 nectar liquids currently  -pt was struggling with mastication and movement  of bites through her mouth. Will downgrade to D2 and ask SLP to follow up in AM 9.  Hypertension.  Maxide 37.5-25 mg daily, clonidine patch 0.1 mg change every 7 days, Zebeta 2.5 mg daily.     -follow for pattern  -adjust regimen as needed 10.  Hyperlipidemia.  Lipitor 11.  Hypothyroidism.  Synthroid     Lavon Paganini Angiulli, PA-C 02/11/2019

## 2019-02-10 NOTE — Progress Notes (Signed)
STROKE TEAM PROGRESS NOTE   INTERVAL HISTORY Patient is doing well  She still has left hemiparesis but is improving  Blood pressure adequately controlled.  No changes overnight.  OBJECTIVE Vitals:   02/09/19 2307 02/10/19 0412 02/10/19 0910 02/10/19 1221  BP: (!) 182/66 (!) 163/68 (!) 157/65 (!) 141/55  Pulse: 75 78 71 66  Resp: 18 16 20 20   Temp: 97.8 F (36.6 C) 98.2 F (36.8 C) (!) 97.4 F (36.3 C) 97.6 F (36.4 C)  TempSrc: Oral Oral Oral Oral  SpO2: 94% 97% 99% 100%  Weight:      Height:       CBC:  Recent Labs  Lab 02/05/19 0547 02/06/19 0252 02/09/19 0406  WBC 9.4 11.5* 7.3  NEUTROABS 6.7 10.6*  --   HGB 11.5* 12.7 12.7  HCT 37.1 39.0 39.6  MCV 93.0 92.6 92.1  PLT 234 282 [id number]   Basic Metabolic Panel:  Recent Labs  Lab 02/04/19 0401  02/08/19 0814 02/09/19 0406  NA 145   < > 140 138  K 3.0*   < > 4.0 3.6  CL 107   < > 107 105  CO2 26   < > 21* 22  GLUCOSE 106*   < > 84 90  BUN 23   < > 19 18  CREATININE 1.00   < > 0.77 0.83  CALCIUM 8.3*   < > 8.7* 8.5*  MG 1.9  --   --   --   PHOS 3.3  --   --   --    < > = values in this interval not displayed.   Lipid Panel:     Component Value Date/Time   CHOL 213 (H) 02/06/2019 0252   CHOL 98 09/07/2013 0358   TRIG 249 (H) 02/07/2019 0507   TRIG 63 09/07/2013 0358   HDL 44 02/06/2019 0252   HDL 43 09/07/2013 0358   CHOLHDL 4.8 02/06/2019 0252   VLDL 30 02/06/2019 0252   VLDL 13 09/07/2013 0358   LDLCALC 139 (H) 02/06/2019 0252   LDLCALC 42 09/07/2013 0358   HgbA1c:  Lab Results  Component Value Date   HGBA1C 5.6 02/06/2019   IMAGING  Ct Angio Head W Or Wo Contrast Ct Angio Neck W Or Wo Contrast Ct Cerebral Perfusion W Contrast 02/05/2019 1. CT angiography confirms the MR findings.  2. Severe stenosis with near occlusion of the right M1 segment.  3. Occlusion of the left A1 segment.  4. Moderate stenosis of the right P1 P2 junction. 40% stenosis at the right carotid bifurcation.  5. Right  vertebral artery occluded at its origin. Reconstituted flow and retrograde flow in the distal right vertebral artery. 50% proximal basilar stenosis.  6. No CBF less than 30% identified by this study. Possible reperfusion of the completed infarctions of the deep brain on the right shown by MRI.  7. Small focus of T-max greater than 6 seconds in the right parietal region. 110 cc region of T-max greater than 4 seconds throughout the right middle cerebral artery distribution, indicating at risk brain secondary to the near occlusion of the right MCA.   Ct Head Wo Contrast 02/05/2019 No change from yesterday Moderate atrophy. Microvascular ischemic changes are present a white matter right greater than left, stable from prior study.   Mr Angio Head Wo Contrast Mr Angio Neck W Wo Contrast Mr Brain Wo Contrast 02/05/2019 1. Acute infarct deep white matter on the right in a watershed territory, likely due to  severe stenosis right M1 segment.  2. Small acute infarct left lateral posterior temporal lobe  3. Carotid bifurcation widely patent in the neck. Mild to moderate stenosis of the left petrous carotid.  4. Left vertebral artery widely patent. Right vertebral artery severely diseased with long segment occlusion and partial reconstitution distally.  5. Moderate to severe stenosis right P1. Severe stenosis right P2 segment. Moderate stenosis left P1 segment.  6. Occluded left A1 segment. Severe stenosis right M1 segment. Mild to moderate stenosis left M1.   Interventional Radiology 02/05/2019 S/balloon angioplasty x 3 of pre occlusive symptomatic rt mca m1 SEG TO IMPROVED CALIBER TO APPROX 70 %.  Transthoracic Echocardiogram  02/03/2019  1. Left ventricular ejection fraction, by visual estimation, is 65 to 70%. The left ventricle has hyperdynamic function. There is no left ventricular hypertrophy.  2. Global right ventricle has normal systolic function.The right ventricular size is normal. No increase in  right ventricular wall thickness.  3. Left atrial size was normal.  4. Right atrial size was normal.  5. The mitral valve is normal in structure. Trace mitral valve regurgitation.  6. The tricuspid valve is normal in structure. Tricuspid valve regurgitation is trivial.  7. The aortic valve is normal in structure. Aortic valve regurgitation was not visualized by color flow Doppler.  8. The pulmonic valve was not well visualized. Pulmonic valve regurgitation was not assessed by color flow Doppler.  9. The aortic root was not well visualized. 10. The interatrial septum was not well visualized.  ECG - ST rate 104   BPM. (See cardiology reading for complete details)   PHYSICAL EXAM     Obese elderly Caucasian lady not in distress . Afebrile. Head is nontraumatic. Neck is supple without bruit.    Cardiac exam no murmur or gallop. Lungs are clear to auscultation. Distal pulses are well felt. Neurological Exam She is awake alert and interactive.  Her speech is dysarthric.  Extraocular movements appear full range without nystagmus.  She blinks to threat bilaterally.  Fundi not visualized.  Face appears symmetric.  Tongue midline.  She has normal strength on the right side.  She has left hemiparesis with 3/5 left upper extremity and 3/5 left lower extremity strength with distal weakness in the left hand and grip and left hip flexors.  Tone is diminished in the left upper extremity.  Sensation slightly diminished on the left upper and lower extremity compared to the right.  Plantars are downgoing.  Gait not tested.  ASSESSMENT/PLAN Christy Carter is a 85 y.o. female with history of sleep apnea, HTN, HLD, carpal tunnel, previous stroke presenting to [facility] with UTI, pneumonia and left sided weakness. She did not receive IV t-PA due to late presentation (>4.5 hours from time of onset). MRI found B small infarcts, largest on the R with severe stenosis throughout. transferred to [facility]  for IR for R M1 stenosis. 02/05/2019 s/p angioplasty x 3 with APPROX 70 % flow return.  Stroke: R watershed territory infarct in setting of severe stenosis right M1 segment s/p IR w/ partial revascularization AND possible small left lateral posterior temporal lobe infarct AND R middle cerebellar peduncle in setting of diffuse intracranial stenosis as likely etiology  Code Stroke CT Head - not ordered  CT head - No change from yesterday Moderate atrophy. Microvascular ischemic changes are present a white matter right greater than left, stable from prior study.   MRI head -  Acute infarct deep white matter on  the right in a watershed territory, likely due to severe stenosis right M1 segment. Small acute infarct left lateral posterior temporal lobe   MRA H&N -  Right vertebral artery severely diseased with long segment occlusion and partial reconstitution distally. Moderate to severe stenosis right P1. Severe stenosis right P2 segment. Moderate stenosis left P1 segment. Occluded left A1 segment. Severe stenosis right M1 segment. Mild to moderate stenosis left M1.   CTA H&N - Severe stenosis with near occlusion of the right M1 segment. Occlusion of the left A1 segment. Moderate stenosis of the right P1 P2 junction. 40% stenosis at the right carotid bifurcation. Right vertebral artery occluded at its origin. Reconstituted flow and retrograde flow in the distal right vertebral artery. 50% proximal basilar stenosis.    CT Perfusion - No CBF less than 30% identified by this study. Possible reperfusion of the completed infarctions of the deep brain on the right shown by MRI. Small focus of T-max greater than 6 seconds in the right parietal region. 110 cc region of T-max greater than 4 seconds throughout the right middle cerebral artery distribution, indicating at risk brain secondary to the near occlusion of the right MCA.   Repeat MRI 9/28 new punctate R temporal occipital lobe infarct. Subtle R middle  cerebellar peduncle abnormality possible stroke. R deep white matter infarct. Small vessel disease. Atrophy. Old R cerebellar lacunes.   Repeat MRA 9/28 persistent R M1 focal stenosis. Occlusion L A1. R VA w/ retrograde flow. Moderate B PCA P1-P2 jxn stenoses  2D Echo - EF 65 - 70%. No cardiac source of emboli identified.   [place] Corona Virus 2 - negative  LDL - 139  HgbA1c - 5.6  VTE prophylaxis - [place] Heparin  aspirin 325 mg daily prior to admission, now on aspirin 81 mg daily and Brilinta  Therapy recommendations:  SNF. Given improvement, will have therapy to reassess for possible CIR.   Disposition:  Pending  Transfer to floor  Agitation/sundowning   Received haldol x 2 last night and also the night prior  Avoid haldol and ativan if possible  Add standing seroquel 12.5 q hs  Hypertension  Home BP meds: Ziac / Maxzide  Lost IV access this am  On clonidine 0.1 patch.  On bisoprolol 2.5, triamterene-HCTZ 37.5-25   SBP goal < 180  . Long-term BP goal normotensive  Hyperlipidemia  Home Lipid lowering medication:  none  LDL 139, goal < 70  On Lipitor 40 mg daily   Continue statin at discharge  Other Stroke Risk Factors  Advanced age  Former cigarette smoker - quit  Obesity, recommend weight loss, diet and exercise as appropriate   Hx stroke/TIA  Obstructive sleep apnea  Dysphagia . Secondary to stroke . Cleared for D1 honey thick liquids . Speech following   Other Active Problems  UTI - UCx 9/24 multiple species. Rocephin 9/26>>9/28 (3 doses)  Mild Leukocytosis - 11.5  (afebrile )    Hypokalemia, resolved- 4.0      Hospital day # 5 Patient appears medically stable to be transferred to inpatient rehab later today when bed available and after insurance approval. Antony Contras, MD  Antony Contras, MDTo contact Stroke Continuity provider, please refer to [email or web address] After hours, contact General Neurology

## 2019-02-10 NOTE — Progress Notes (Signed)
Physical Therapy Treatment Patient Details Name: Christy Carter MRN: [id number] DOB: [birth year] Today's Date: 02/10/2019    History of Present Illness  Christy Carter is a 85 y.o. female PMH of sleep apnea, HTN, HLD, carpal tunnel, stroke who presented to [facility] on 9/24 for SOB. She was admitted by the medical services there, noted to have a pneumonia/UTI. Pt also found to have L sided weakness. Pt transfered to [facility]. MRI revealed bila small acute infarct with, in deep white matter watershed territory on the R. MRA revealed severe R M1 stensosis, R vertebral occlusion, moderate  to severe R P1 stenosis, severe R P2 stenosis and occluded L A1 in addition to mild/mod L m1 stensosis.  Pt s/p R M1 thrombectomy/anbioplasty.    PT Comments    Patient seen for mobility progression. This session focused on functional transfer training. Stedy standing frame utilized. Pt continues to require +2 assist for OOB mobility. Continue to progress as tolerated.    Follow Up Recommendations  CIR     Equipment Recommendations  Other (comment)(TBD)    Recommendations for Other Services Rehab consult     Precautions / Restrictions Precautions Precautions: Fall Precaution Comments: morbidly obese Restrictions Weight Bearing Restrictions: No    Mobility  Bed Mobility Overal bed mobility: Needs Assistance Bed Mobility: Supine to Sit     Supine to sit: Max assist;HOB elevated     General bed mobility comments: cues for sequencing and assistance to bring hips to EOB and to reach bed rail going toward L side and assist to elevate trunk into sitting  Transfers Overall transfer level: Needs assistance   Transfers: Sit to/from Stand Sit to Stand: Max assist;+2 physical assistance;Mod assist         General transfer comment: max A +2 from EOB and mod A +2 from [place] seat; +2 assist with use of bed pad to power up into standing   Ambulation/Gait                 Stairs              Wheelchair Mobility    Modified Rankin (Stroke Patients Only)       Balance Overall balance assessment: Needs assistance Sitting-balance support: Single extremity supported;Feet supported Sitting balance-Leahy Scale: Poor                                      Cognition Arousal/Alertness: Awake/alert(drowsy) Behavior During Therapy: WFL for tasks assessed/performed Overall Cognitive Status: Impaired/Different from baseline Area of Impairment: Problem solving;Following commands                       Following Commands: Follows one step commands with increased time     Problem Solving: Slow processing;Requires verbal cues;Requires tactile cues;Difficulty sequencing        Exercises      General Comments        Pertinent Vitals/Pain Pain Assessment: No/denies pain    Home Living                      Prior Function            PT Goals (current goals can now be found in the care plan section) Acute Rehab PT Goals Patient Stated Goal: rehab then home Progress towards PT goals: Progressing toward goals    Frequency    Min  4X/week      PT Plan Current plan remains appropriate    Co-evaluation              AM-PAC PT "6 Clicks" Mobility   Outcome Measure  Help needed turning from your back to your side while in a flat bed without using bedrails?: A Lot Help needed moving from lying on your back to sitting on the side of a flat bed without using bedrails?: A Lot Help needed moving to and from a bed to a chair (including a wheelchair)?: A Lot Help needed standing up from a chair using your arms (e.g., wheelchair or bedside chair)?: A Lot Help needed to walk in hospital room?: Total Help needed climbing 3-5 steps with a railing? : Total 6 Click Score: 10    End of Session Equipment Utilized During Treatment: Gait belt Activity Tolerance: Patient tolerated treatment well Patient left: in chair;with call bell/phone  within reach;with chair alarm set Nurse Communication: Mobility status PT Visit Diagnosis: Muscle weakness (generalized) (M62.81);Difficulty in walking, not elsewhere classified (R26.2) Pain - Right/Left: Left Pain - part of body: Arm;Hand     Time: [id number] PT Time Calculation (min) (ACUTE ONLY): 35 min  Charges:  $Gait Training: 23-37 mins                     Earney Navy, PTA Acute Rehabilitation Services Pager: [phone] Office: [phone]     Darliss Cheney 02/10/2019, 11:43 AM

## 2019-02-11 ENCOUNTER — Inpatient Hospital Stay (HOSPITAL_COMMUNITY): Payer: Medicare Other

## 2019-02-11 ENCOUNTER — Other Ambulatory Visit: Payer: Self-pay

## 2019-02-11 ENCOUNTER — Inpatient Hospital Stay (HOSPITAL_COMMUNITY)
Admission: RE | Admit: 2019-02-11 | Discharge: 2019-03-09 | DRG: 056 | Disposition: A | Discharge status: Home Health | Payer: Medicare Other | Source: Intra-hospital | Attending: Physical Medicine & Rehabilitation | Admitting: Physical Medicine & Rehabilitation

## 2019-02-11 ENCOUNTER — Encounter (HOSPITAL_COMMUNITY): Payer: Self-pay

## 2019-02-11 DIAGNOSIS — Z8249 Family history of ischemic heart disease and other diseases of the circulatory system: Secondary | ICD-10-CM

## 2019-02-11 DIAGNOSIS — G479 Sleep disorder, unspecified: Secondary | ICD-10-CM | POA: Diagnosis not present

## 2019-02-11 DIAGNOSIS — I69322 Dysarthria following cerebral infarction: Secondary | ICD-10-CM | POA: Diagnosis not present

## 2019-02-11 DIAGNOSIS — M19071 Primary osteoarthritis, right ankle and foot: Secondary | ICD-10-CM | POA: Diagnosis present

## 2019-02-11 DIAGNOSIS — Z791 Long term (current) use of non-steroidal anti-inflammatories (NSAID): Secondary | ICD-10-CM

## 2019-02-11 DIAGNOSIS — Z23 Encounter for immunization: Secondary | ICD-10-CM | POA: Diagnosis not present

## 2019-02-11 DIAGNOSIS — K219 Gastro-esophageal reflux disease without esophagitis: Secondary | ICD-10-CM

## 2019-02-11 DIAGNOSIS — G8194 Hemiplegia, unspecified affecting left nondominant side: Secondary | ICD-10-CM

## 2019-02-11 DIAGNOSIS — Z6836 Body mass index (BMI) 36.0-36.9, adult: Secondary | ICD-10-CM | POA: Diagnosis not present

## 2019-02-11 DIAGNOSIS — G9340 Encephalopathy, unspecified: Secondary | ICD-10-CM

## 2019-02-11 DIAGNOSIS — R52 Pain, unspecified: Secondary | ICD-10-CM

## 2019-02-11 DIAGNOSIS — N179 Acute kidney failure, unspecified: Secondary | ICD-10-CM | POA: Diagnosis not present

## 2019-02-11 DIAGNOSIS — R4189 Other symptoms and signs involving cognitive functions and awareness: Secondary | ICD-10-CM

## 2019-02-11 DIAGNOSIS — Z7982 Long term (current) use of aspirin: Secondary | ICD-10-CM | POA: Diagnosis not present

## 2019-02-11 DIAGNOSIS — R1312 Dysphagia, oropharyngeal phase: Secondary | ICD-10-CM | POA: Diagnosis present

## 2019-02-11 DIAGNOSIS — N390 Urinary tract infection, site not specified: Secondary | ICD-10-CM

## 2019-02-11 DIAGNOSIS — I69354 Hemiplegia and hemiparesis following cerebral infarction affecting left non-dominant side: Secondary | ICD-10-CM | POA: Diagnosis not present

## 2019-02-11 DIAGNOSIS — E785 Hyperlipidemia, unspecified: Secondary | ICD-10-CM

## 2019-02-11 DIAGNOSIS — R062 Wheezing: Secondary | ICD-10-CM

## 2019-02-11 DIAGNOSIS — R05 Cough: Secondary | ICD-10-CM

## 2019-02-11 DIAGNOSIS — Z87891 Personal history of nicotine dependence: Secondary | ICD-10-CM | POA: Diagnosis not present

## 2019-02-11 DIAGNOSIS — R739 Hyperglycemia, unspecified: Secondary | ICD-10-CM

## 2019-02-11 DIAGNOSIS — E039 Hypothyroidism, unspecified: Secondary | ICD-10-CM | POA: Diagnosis present

## 2019-02-11 DIAGNOSIS — D72829 Elevated white blood cell count, unspecified: Secondary | ICD-10-CM

## 2019-02-11 DIAGNOSIS — E669 Obesity, unspecified: Secondary | ICD-10-CM | POA: Diagnosis present

## 2019-02-11 DIAGNOSIS — G9341 Metabolic encephalopathy: Secondary | ICD-10-CM | POA: Diagnosis not present

## 2019-02-11 DIAGNOSIS — Z79899 Other long term (current) drug therapy: Secondary | ICD-10-CM

## 2019-02-11 DIAGNOSIS — R0989 Other specified symptoms and signs involving the circulatory and respiratory systems: Secondary | ICD-10-CM

## 2019-02-11 DIAGNOSIS — I6389 Other cerebral infarction: Secondary | ICD-10-CM | POA: Diagnosis not present

## 2019-02-11 DIAGNOSIS — I69391 Dysphagia following cerebral infarction: Secondary | ICD-10-CM

## 2019-02-11 DIAGNOSIS — R531 Weakness: Secondary | ICD-10-CM | POA: Diagnosis not present

## 2019-02-11 DIAGNOSIS — I10 Essential (primary) hypertension: Secondary | ICD-10-CM | POA: Diagnosis not present

## 2019-02-11 DIAGNOSIS — I639 Cerebral infarction, unspecified: Secondary | ICD-10-CM | POA: Diagnosis not present

## 2019-02-11 DIAGNOSIS — Z79890 Hormone replacement therapy (postmenopausal): Secondary | ICD-10-CM

## 2019-02-11 DIAGNOSIS — R0602 Shortness of breath: Secondary | ICD-10-CM

## 2019-02-11 DIAGNOSIS — I6300 Cerebral infarction due to thrombosis of unspecified precerebral artery: Secondary | ICD-10-CM

## 2019-02-11 DIAGNOSIS — R059 Cough, unspecified: Secondary | ICD-10-CM

## 2019-02-11 DIAGNOSIS — M109 Gout, unspecified: Secondary | ICD-10-CM | POA: Diagnosis not present

## 2019-02-11 LAB — CBC
HCT: 40.3 % (ref 36.0–46.0)
Hemoglobin: 13 g/dL (ref 12.0–15.0)
MCH: 30 pg (ref 26.0–34.0)
MCHC: 32.3 g/dL (ref 30.0–36.0)
MCV: 93.1 fL (ref 80.0–100.0)
Platelets: 337 10*3/uL (ref 150–400)
RBC: 4.33 MIL/uL (ref 3.87–5.11)
RDW: 15.3 % (ref 11.5–15.5)
WBC: 9.6 10*3/uL (ref 4.0–10.5)
nRBC: 0 % (ref 0.0–0.2)

## 2019-02-11 LAB — CREATININE, SERUM
Creatinine, Ser: 0.96 mg/dL (ref 0.44–1.00)
GFR calc Af Amer: >60 mL/min (ref >60)
GFR calc non Af Amer: 55 mL/min — ABNORMAL LOW (ref >60)

## 2019-02-11 MED ORDER — ACETAMINOPHEN 650 MG RE SUPP: 650.0000 mg | RECTAL | Status: DC | PRN

## 2019-02-11 MED ORDER — ATORVASTATIN CALCIUM 40 MG PO TABS: 40.0000 mg | ORAL_TABLET | Freq: Every day | ORAL | Status: DC

## 2019-02-11 MED ORDER — ACETAMINOPHEN 325 MG PO TABS
650.0000 mg | ORAL_TABLET | ORAL | Status: DC | PRN
  Administered 2019-02-12 – 2019-03-02 (×14): 650 mg via ORAL
  Filled 2019-02-11 (×14): qty 2

## 2019-02-11 MED ORDER — HEPARIN SODIUM (PORCINE) 5000 UNIT/ML IJ SOLN: 5000.0000 [IU] | Freq: Three times a day (TID) | INTRAMUSCULAR | Status: DC

## 2019-02-11 MED ORDER — QUETIAPINE FUMARATE 25 MG PO TABS: 12.5000 mg | ORAL_TABLET | Freq: Every day | ORAL | Status: DC

## 2019-02-11 MED ORDER — FUROSEMIDE 40 MG PO TABS
40.0000 mg | ORAL_TABLET | Freq: Once | ORAL | Status: AC
  Administered 2019-02-11: 40 mg via ORAL
  Filled 2019-02-11: qty 1

## 2019-02-11 MED ORDER — CLONIDINE HCL 0.1 MG/24HR TD PTWK
0.1000 mg | MEDICATED_PATCH | TRANSDERMAL | Status: DC
  Administered 2019-02-14 – 2019-03-07 (×4): 0.1 mg via TRANSDERMAL
  Filled 2019-02-11 (×4): qty 1

## 2019-02-11 MED ORDER — TICAGRELOR 90 MG PO TABS: 90.0000 mg | ORAL_TABLET | Freq: Two times a day (BID) | ORAL | Status: DC

## 2019-02-11 MED ORDER — TICAGRELOR 90 MG PO TABS
90.0000 mg | ORAL_TABLET | Freq: Two times a day (BID) | ORAL | Status: DC
  Administered 2019-02-11 – 2019-03-09 (×51): 90 mg via ORAL
  Filled 2019-02-11 (×52): qty 1

## 2019-02-11 MED ORDER — BISOPROLOL FUMARATE 5 MG PO TABS
2.5000 mg | ORAL_TABLET | Freq: Every day | ORAL | Status: DC
  Administered 2019-02-12 – 2019-03-09 (×26): 2.5 mg via ORAL
  Filled 2019-02-11 (×27): qty 0.5

## 2019-02-11 MED ORDER — SENNOSIDES-DOCUSATE SODIUM 8.6-50 MG PO TABS
1.0000 | ORAL_TABLET | Freq: Every day | ORAL | Status: DC
  Administered 2019-02-12 – 2019-02-16 (×4): 1 via ORAL
  Filled 2019-02-11 (×5): qty 1

## 2019-02-11 MED ORDER — TRIAMTERENE-HCTZ 37.5-25 MG PO TABS
1.0000 | ORAL_TABLET | Freq: Every day | ORAL | Status: DC
  Administered 2019-02-12 – 2019-02-22 (×11): 1
  Filled 2019-02-11 (×11): qty 1

## 2019-02-11 MED ORDER — RESOURCE THICKENUP CLEAR PO POWD: 1.0000 | ORAL | Status: DC | PRN

## 2019-02-11 MED ORDER — INFLUENZA VAC A&B SA ADJ QUAD 0.5 ML IM PRSY
0.5000 mL | PREFILLED_SYRINGE | INTRAMUSCULAR | Status: AC
  Administered 2019-02-12: 0.5 mL via INTRAMUSCULAR
  Filled 2019-02-11: qty 0.5

## 2019-02-11 MED ORDER — SODIUM CHLORIDE 0.9 % IV SOLN: 75.0000 mL | INTRAVENOUS | 0 refills | Status: DC

## 2019-02-11 MED ORDER — ASPIRIN 81 MG PO TBEC: 81.0000 mg | DELAYED_RELEASE_TABLET | Freq: Every day | ORAL | Status: AC

## 2019-02-11 MED ORDER — ACETAMINOPHEN 160 MG/5ML PO SOLN: 650.0000 mg | ORAL | Status: DC | PRN

## 2019-02-11 MED ORDER — TICAGRELOR 90 MG PO TABS
90.0000 mg | ORAL_TABLET | Freq: Two times a day (BID) | ORAL | Status: DC
  Filled 2019-02-11 (×3): qty 1

## 2019-02-11 MED ORDER — ASPIRIN EC 81 MG PO TBEC
81.0000 mg | DELAYED_RELEASE_TABLET | Freq: Every day | ORAL | Status: DC
  Administered 2019-02-12 – 2019-03-09 (×26): 81 mg via ORAL
  Filled 2019-02-11 (×26): qty 1

## 2019-02-11 MED ORDER — POTASSIUM CHLORIDE CRYS ER 10 MEQ PO TBCR
10.0000 meq | EXTENDED_RELEASE_TABLET | Freq: Two times a day (BID) | ORAL | Status: DC
  Administered 2019-02-11 – 2019-02-12 (×2): 10 meq via ORAL
  Filled 2019-02-11 (×2): qty 1

## 2019-02-11 MED ORDER — IPRATROPIUM-ALBUTEROL 0.5-2.5 (3) MG/3ML IN SOLN
3.0000 mL | Freq: Four times a day (QID) | RESPIRATORY_TRACT | Status: DC | PRN
  Administered 2019-02-11: 3 mL via RESPIRATORY_TRACT
  Filled 2019-02-11: qty 3

## 2019-02-11 MED ORDER — ORAL CARE MOUTH RINSE
15.0000 mL | Freq: Two times a day (BID) | OROMUCOSAL | Status: DC
  Administered 2019-02-12 – 2019-03-09 (×35): 15 mL via OROMUCOSAL

## 2019-02-11 MED ORDER — CHLORHEXIDINE GLUCONATE CLOTH 2 % EX PADS: 6.0000 | MEDICATED_PAD | Freq: Every day | CUTANEOUS | Status: DC

## 2019-02-11 MED ORDER — PANTOPRAZOLE SODIUM 40 MG PO TBEC
80.0000 mg | DELAYED_RELEASE_TABLET | Freq: Every day | ORAL | Status: DC
  Administered 2019-02-12 – 2019-03-09 (×26): 80 mg via ORAL
  Filled 2019-02-11 (×26): qty 2

## 2019-02-11 MED ORDER — SENNOSIDES-DOCUSATE SODIUM 8.6-50 MG PO TABS: 1.0000 | ORAL_TABLET | Freq: Every day | ORAL | Status: DC

## 2019-02-11 MED ORDER — ATORVASTATIN CALCIUM 40 MG PO TABS
40.0000 mg | ORAL_TABLET | Freq: Every day | ORAL | Status: DC
  Administered 2019-02-11 – 2019-03-08 (×26): 40 mg via ORAL
  Filled 2019-02-11 (×26): qty 1

## 2019-02-11 MED ORDER — SORBITOL 70 % SOLN
30.0000 mL | Freq: Every day | Status: DC | PRN
  Administered 2019-02-16 – 2019-03-03 (×3): 30 mL via ORAL
  Filled 2019-02-11 (×3): qty 30

## 2019-02-11 MED ORDER — QUETIAPINE FUMARATE 25 MG PO TABS
12.5000 mg | ORAL_TABLET | Freq: Every day | ORAL | Status: DC
  Administered 2019-02-11 – 2019-02-26 (×16): 12.5 mg via ORAL
  Filled 2019-02-11 (×16): qty 1

## 2019-02-11 MED ORDER — HEPARIN SODIUM (PORCINE) 5000 UNIT/ML IJ SOLN
5000.0000 [IU] | Freq: Three times a day (TID) | INTRAMUSCULAR | Status: DC
  Administered 2019-02-11 – 2019-03-09 (×71): 5000 [IU] via SUBCUTANEOUS
  Filled 2019-02-11 (×76): qty 1

## 2019-02-11 MED ORDER — CLONIDINE 0.1 MG/24HR TD PTWK: 0.1000 mg | MEDICATED_PATCH | TRANSDERMAL | 12 refills | Status: DC

## 2019-02-11 MED ORDER — RESOURCE THICKENUP CLEAR PO POWD
ORAL | Status: DC | PRN
  Filled 2019-02-11 (×2): qty 125

## 2019-02-11 MED ORDER — LEVOTHYROXINE SODIUM 100 MCG PO TABS
100.0000 ug | ORAL_TABLET | Freq: Every day | ORAL | Status: DC
  Administered 2019-02-13 – 2019-02-28 (×16): 100 ug via ORAL
  Filled 2019-02-11 (×19): qty 1

## 2019-02-11 NOTE — Progress Notes (Signed)
Called to room by dtr, pt reports SOB with "tightness in chest" BP elevated, o2 Sat 95-97% on RA and pulse is in 50s. Pt alert, answering questions reported has trouble breathing when lying flat. Noted decreased breath sounds  And exp wheezes. Resp called for breathing treatment. [name] PA notified of status and new orders received. Christy Carter

## 2019-02-11 NOTE — Progress Notes (Signed)
Admit to unit. Oriented to rehab, medications discussed. Therapy scheduled. Plan of care. Daughter and pt state understanding of review. Christy Carter [name]

## 2019-02-11 NOTE — Progress Notes (Signed)
Modified Barium Swallow Progress Note  Patient Details  Name: DOLOROS IGOE MRN: [id number] Date of Birth: [birth year]  Today's Date: 02/11/2019  Modified Barium Swallow completed.  Full report located under Chart Review in the Imaging Section.  Brief recommendations include the following:  Clinical Impression  Pt demonstrates a mild oropharyngeal dysphagia with slightly delayed laryngeal closure. Laryngeal elevation is timely, but the vestibule remains exposed as thin liquids reach the pyriforms allowing for sensed aspiration events during the swallow, just before hyoid excursion and epiglottic deflection. Most of pts subglottis was obscured by her shoulders despite repositioning, so the presence or absence of aspiration could not be seen in many trials. However pt had several visable episodes of aspiration with thin liquids via cup or straw and still had visible (at least) penetration with a chin tuck. Pt was able to consistently protect the airway with nectar, one sip at a time. Recommend pt continue nectar thick liquids and mechanical soft solids. Suspect that with training for an immediate protective throat clear or cough pt could reliably protect airway with thin liquids.    Swallow Evaluation Recommendations       SLP Diet Recommendations: Dysphagia 3 (Mech soft) solids;Nectar thick liquid   Liquid Administration via: Cup;Straw   Medication Administration: Whole meds with puree   Supervision: Staff to assist with self feeding   Compensations: Lingual sweep for clearance of pocketing;Small sips/bites;Slow rate       Oral Care Recommendations: Oral care BID       Herbie Baltimore, MA CCC-SLP  Acute Rehabilitation Services Pager [phone] Office [phone]  Brennley Curtice, Katherene Ponto 02/11/2019,2:12 PM

## 2019-02-11 NOTE — Progress Notes (Signed)
  Speech Language Pathology Treatment: Dysphagia  Patient Details Name: Christy Carter MRN: [id number] DOB: 03-01-1934 Today's Date: 02/11/2019 Time: [id number] SLP Time Calculation (min) (ACUTE ONLY): 10 min  Assessment / Plan / Recommendation Clinical Impression  Saw pt for ongoing swallowing therapy.   Pt tolerated nectar thick liquid by cup with no clinical s/s of aspiration.  There was reflexive cough following straw sips of nectar thick liquid.  With thin liquid by cup there was brief wet vocal quality/breath sounds following 2 of 2 trials. Pt was unable to masticate trials of regular solid 2/2 edentulism.  There was cough after very small amount of regular solid, which pt denied was a result of PO trial, stating instead that she had a tickle in her throat.  Pt is scheduled for MBSS today at 1230 prior to d/c to CIR to determine if she can safely advance to thin liquids. Recommend continuing mechanical soft diet with nectar thick liquid pending results of MBSS.   HPI HPI: Christy Carter is a 85 y.o. female PMH of sleep apnea, HTN, HLD, carpal tunnel, stroke who presented to [facility] on 9/24 for SOB. She was admitted by the medical services there, noted to have a pneumonia/UTI. She was noted to have some left sided weakness on initial exam, and again today, with LKW documented to be 0200 02/05/2019. Further imaging was done in the form of MRI of the brain after a CT showed no acute changes.  The MRI brain showed bilateral small acute infarct with the largest being in the deep white matter watershed territory on the right. The neurologist over at [facility] discussed with the family who wanted aggressive care and she was transferred directly to the IR suite at [facility].  CXR 9/24: vascular congestion; "Streaky bibasilar opacities, favor atelectasis".  MRI 9/26:  "IMPRESSION: 1. Acute infarct deep white matter on the right in a watershed territory, likely due to severe stenosis right M1  segment. 2. Small acute infarct left lateral posterior temporal lobe 3. Carotid bifurcation widely patent in the neck. Mild to moderate stenosis of the left petrous carotid. 4. Left vertebral artery widely patent. Right vertebral artery severely diseased with long segment occlusion and partial reconstitution distally. 5. Moderate to severe stenosis right P1. Severe stenosis right P2 segment. Moderate stenosis left P1 segment. 6. Occluded left A1 segment. Severe stenosis right M1 segment. Mild to moderate stenosis left M1."      SLP Plan  Continue with current plan of care       Recommendations  Diet recommendations: Dysphagia 3 (mechanical soft);Nectar-thick liquid Liquids provided via: Cup Medication Administration: Crushed with puree Supervision: Full supervision/cueing for compensatory strategies;Staff to assist with self feeding Compensations: Lingual sweep for clearance of pocketing;Small sips/bites;Slow rate Postural Changes and/or Swallow Maneuvers: Seated upright 90 degrees                Oral Care Recommendations: Oral care BID Follow up Recommendations: Inpatient Rehab;24 hour supervision/assistance SLP Visit Diagnosis: Dysphagia, oropharyngeal phase (R13.12) Plan: Continue with current plan of care       [place], [place], [place] Office: [phone]; Pager (10/2): [phone] 02/11/2019, 10:58 AM

## 2019-02-11 NOTE — Progress Notes (Signed)
Meredith Staggers, MD  Physician  Physical Medicine and Rehabilitation  PMR Pre-admission  Signed  Date of Service:  02/10/2019 3:55 PM      Related encounter: Admission (Discharged) from 02/05/2019 in [facility]      Signed         Show:Clear all '[x]'$ Manual'[x]'$ Template'[x]'$ Copied  Added by: '[x]'$ Cristina Gong, RN'[x]'$ Meredith Staggers, MD  '[]'$ Hover for details PMR Admission Coordinator Pre-Admission Assessment  Patient: Christy Carter is an 85 y.o., female MRN: [id number] DOB: Jul 12, 1933 Height: '5\' 6"'$  (167.6 cm) Weight: 111.9 kg  Insurance Information HMO: yes    PPO:      PCP:      IPA:      80/20:      OTHER: medicare advantage plan PRIMARY: Nina dual complete       Policy#: [id number]      Subscriber: pt CM Name: Carlyle Basques      Phone#: [phone]     Fax#: portal Pre-Cert#: [id number]    Approved for 7 days with f/u optum CM Tildon Husky phone [phone] fax [phone]  Employer:  Benefits:  Phone #: [phone]     Name: 9/30 Eff. Date: 10/11/2018     Deduct: none      Out of Pocket Max: [id number]      Life Max: none CIR: $250 co pay per admit if no Medicaid      SNF: no co pay days 1 until 20; $176 co pay per day days 21 until 100 Outpatient: no co pay      Co-Pay: visits per medical neccesity Home Health: 1005      Co-Pay: visits per medical neccesity DME: 80%     Co-Pay: 205 Providers: in network  SECONDARY: Medicaid      Policy#: [id number]      Subscriber: pt 10/1 per passport one source not active for QMB; I discussed with daughter that it looks like Medicaid not currently active  Medicaid Application Date:       Case Manager:  Disability Application Date:       Case Worker:   The "Data Collection Information Summary" for patients in Inpatient Rehabilitation Facilities with attached "Privacy Act [place] Records" was provided and verbally reviewed with: Patient and Family  Emergency Contact Information          Contact Information    Name Relation Home Work Mobile   [name],[name] Daughter [phone]     Dalisa, Forrer [phone]        Current Medical History  Patient Admitting Diagnosis: CVA  History of Present Illness: 85 year old right-handed female with history of hyperlipidemia, , CVA maintained on aspirin, hypertension, gout. She quit smoking 26 years ago. Presented 02/02/2021 [facility] with recent fall as well as reports of some memory issues for the past 2 to 3 weeks. Noted shortness of breath with noted hypoxia oxygen saturations 84%. Urinalysis positive nitrite, chest x-ray mild pulmonary vascular congestion, cranial CT scan negative for acute changes, WBC 15,800, troponin negative, CK 327, lactic acid within normal limits, COVID negative, blood cultures no growth to date. Patient was placed on Rocephin for UTI as well as suspected pneumonia. Neurology consulted 02/05/2019 for reports of left-sided weakness. Cranial CT scan repeated 02/05/2019 showing patchy hypodensity in the white matter right greater than left. No midline shift. MRI as well as MRA of head and neck showed acute infarct deep white matter on the right and a watershed territory likely due to severe  stenosis of M1 segment. Small acute infarct left lateral posterior temporal lobe. Occluded left A1 segment. Severe stenosis right M1 segment. Patient was transferred to [facility] underwent balloon angioplasty x3 of pre occlusive symptomatic right MCA M1 segment per interventional radiology. Echocardiogram with ejection fraction of 70% without emboli. Follow-up MRI after angioplasty showed new punctate acute early subacute infarct within the right temporal occipital lobe. Repeat MRSA 02/07/2019 persistent right M1 focal stenosis. Patient presently on low-dose aspirin 81 mg daily as well as Brilinta for CVA prophylaxis. Subcutaneous heparin for DVT prophylaxis. Dysphasia #3 nectar thick liquid diet.  Hospital course bouts of restlessness and agitation she was placed on low-dose nightly Seroquel. Latest follow-up urine study culture multi-species Rocephin has been completed. Leukocytosis improved 7.3.   Complete NIHSS TOTAL: 8  Patient's medical record from [facility]  has been reviewed by the rehabilitation admission coordinator and physician.  Past Medical History      Past Medical History:  Diagnosis Date  . Arthritis   . Carpal tunnel syndrome   . Complication of anesthesia    Difficulty waking up. Believes it took 2 days to wake up after her surgery.  . Depression   . Hyperlipidemia   . Hypertension   . Shortness of breath   . Stroke ([place])   . Thyroid disease     Family History   family history includes Diverticulitis in her mother; Heart disease in her father and mother; Hypertension in her father and mother.  Prior Rehab/Hospitalizations Has the patient had prior rehab or hospitalizations prior to admission? Yes  Has the patient had major surgery during 100 days prior to admission? No              Current Medications  Current Facility-Administered Medications:  .  0.9 %  sodium chloride infusion, , Intravenous, Continuous, Biby, Sharon L, NP, Last Rate: 75 mL/hr at 02/10/19 2117 .  0.9 %  sodium chloride infusion, , Intravenous, PRN, Donzetta Starch, NP .  acetaminophen (TYLENOL) tablet 650 mg, 650 mg, Oral, Q4H PRN, 650 mg at 02/11/19 1020 **OR** acetaminophen (TYLENOL) solution 650 mg, 650 mg, Per Tube, Q4H PRN **OR** acetaminophen (TYLENOL) suppository 650 mg, 650 mg, Rectal, Q4H PRN, Biby, Sharon L, NP .  aspirin EC tablet 81 mg, 81 mg, Oral, Daily, Biby, Sharon L, NP, 81 mg at 02/11/19 1008 .  atorvastatin (LIPITOR) tablet 40 mg, 40 mg, Oral, q1800, Biby, Sharon L, NP, 40 mg at 02/10/19 1708 .  bisoprolol (ZEBETA) tablet 2.5 mg, 2.5 mg, Oral, Daily, Biby, Sharon L, NP, 2.5 mg at 02/11/19 1006 .  Chlorhexidine Gluconate Cloth 2 % PADS  6 each, 6 each, Topical, Daily, Donzetta Starch, NP, 6 each at 02/10/19 0933 .  cloNIDine (CATAPRES - Dosed in mg/24 hr) patch 0.1 mg, 0.1 mg, Transdermal, Q Mon, Biby, Sharon L, NP, 0.1 mg at 02/07/19 0934 .  heparin injection 5,000 Units, 5,000 Units, Subcutaneous, Q8H, Biby, Sharon L, NP, 5,000 Units at 02/11/19 0556 .  ipratropium-albuterol (DUONEB) 0.5-2.5 (3) MG/3ML nebulizer solution 3 mL, 3 mL, Nebulization, Q6H PRN, Biby, Sharon L, NP, 3 mL at 02/09/19 0323 .  levothyroxine (SYNTHROID) tablet 100 mcg, 100 mcg, Oral, QAC breakfast, Biby, Sharon L, NP, 100 mcg at 02/11/19 0556 .  pantoprazole (PROTONIX) EC tablet 80 mg, 80 mg, Oral, Daily, Biby, Sharon L, NP, 80 mg at 02/11/19 1006 .  potassium chloride (K-DUR) CR tablet 10 mEq, 10 mEq, Oral, BID, Biby, Massie Kluver, NP,  10 mEq at 02/11/19 1006 .  QUEtiapine (SEROQUEL) tablet 12.5 mg, 12.5 mg, Oral, QHS, Biby, Sharon L, NP, 12.5 mg at 02/10/19 2120 .  [employer], , Oral, PRN, Donzetta Starch, NP .  senna-docusate (Senokot-S) tablet 1 tablet, 1 tablet, Oral, QHS, Biby, Sharon L, NP, 1 tablet at 02/10/19 1708 .  ticagrelor (BRILINTA) tablet 90 mg, 90 mg, Oral, BID, 90 mg at 02/11/19 1007 **OR** ticagrelor (BRILINTA) tablet 90 mg, 90 mg, Per Tube, BID, Biby, Sharon L, NP, 90 mg at 02/08/19 1007 .  triamterene-hydrochlorothiazide (MAXZIDE-25) 37.5-25 MG per tablet 1 tablet, 1 tablet, Per Tube, Daily, Biby, Sharon L, NP, 1 tablet at 02/11/19 1006  Patients Current Diet:     Diet Order                  DIET DYS 3 Room service appropriate? No; Fluid consistency: Nectar Thick  Diet effective now               Precautions / Restrictions Precautions Precautions: Fall Precaution Comments: morbidly obese Restrictions Weight Bearing Restrictions: No   Has the patient had 2 or more falls or a fall with injury in the past year? No  Prior Activity Level Community (5-7x/wk): Mod I with RW, active  Prior Functional  Level Self Care: Did the patient need help bathing, dressing, using the toilet or eating? Independent  Indoor Mobility: Did the patient need assistance with walking from room to room (with or without device)? Independent  Stairs: Did the patient need assistance with internal or external stairs (with or without device)? Independent  Functional Cognition: Did the patient need help planning regular tasks such as shopping or remembering to take medications? [place] / [place] Devices/Equipment: Eyeglasses Home Equipment: Walker - 4 wheels, Walker - 2 wheels  Prior Device Use: Indicate devices/aids used by the patient prior to current illness, exacerbation or injury? Walker  Current Functional Level Cognition  Arousal/Alertness: Awake/alert Overall Cognitive Status: Impaired/Different from baseline Current Attention Level: Focused Orientation Level: (P) Oriented to person, Oriented to place, Oriented to situation, Disoriented to time Following Commands: Follows one step commands with increased time Safety/Judgement: Decreased awareness of safety, Decreased awareness of deficits General Comments: WFL for mobility tasks Attention: Sustained Sustained Attention: Appears intact Memory: Impaired Memory Impairment: Decreased recall of new information, Decreased short term memory Awareness: Impaired Awareness Impairment: Intellectual impairment Problem Solving: Impaired Problem Solving Impairment: Functional basic Safety/Judgment: Impaired    Extremity Assessment (includes Sensation/Coordination)  Upper Extremity Assessment: LUE deficits/detail RUE Deficits / Details: Limited active movement seen at RUE. Once back in bed, pt rubbing her stomach with RUe and bringing her right hand to her face.  LUE Deficits / Details: 2/5 grossly.  LUE Coordination: decreased fine motor, decreased gross motor  Lower Extremity Assessment: Defer to PT  evaluation RLE Deficits / Details: pt with no voluntary movement, pt holding LE in extension and resisting ROM, in sitting EOB pt bent knees and was able to achieve full knee/ankle PROM LLE Deficits / Details: pt fighting ROM in bed, no active movement, able to achieve full PROM to knee/ankle in sitting    ADLs  Overall ADL's : Needs assistance/impaired Eating/Feeding: Maximal assistance, Bed level Grooming: Wash/dry hands, Wash/dry face, Brushing hair, Maximal assistance, Sitting Grooming Details (indicate cue type and reason): Pt requiring hand over hand to perform. Assist to position comb into L hand. Able to make small combing motion with arm held in supported  position of flexion. Assist also for throughness. General ADL Comments: Worked on incoporating LUE into grooming tasks and attending to left side.     Mobility  Overal bed mobility: Needs Assistance Bed Mobility: Supine to Sit Supine to sit: Max assist, HOB elevated Sit to supine: Max assist, +2 for physical assistance General bed mobility comments: cues for sequencing and assistance to bring hips to EOB and to reach bed rail going toward L side and assist to elevate trunk into sitting    Transfers  Overall transfer level: Needs assistance Transfer via Lift Equipment: Stedy Transfers: Sit to/from Stand Sit to Stand: Max assist, +2 physical assistance, Mod assist General transfer comment: max A +2 from EOB and mod A +2 from [place] seat; +2 assist with use of bed pad to power up into standing     Ambulation / Gait / Stairs / Wheelchair Mobility  Ambulation/Gait General Gait Details: unable at this time    Posture / Balance Dynamic Sitting Balance Sitting balance - Comments: supported seating for UB tasks Balance Overall balance assessment: Needs assistance Sitting-balance support: Single extremity supported, Feet supported Sitting balance-Leahy Scale: Poor Sitting balance - Comments: supported seating for UB  tasks Postural control: Left lateral lean Standing balance comment: dependent on physical assist and equipment    Special needs/care consideration BiPAP/CPAP n/a CPM  N/a Continuous Drip IV  N/a Dialysis n/a Life Vest  N/a Oxygen  N/a Special Bed  N/a Trach Size  N/a Wound Vac n/a Skin  Ecchymosis to right arm and flank, and bilateral LES  Bowel mgmt:  Incontinent LBM 10/1 Bladder mgmt: external catheter Diabetic mgmt:  N/a Behavioral consideration patient confused since admit; not her baseline per daughter Chemo/radiation  N/a Designated visitor  Daughter, Earlie Server   Previous Home Environment  Living Arrangements: Alone  Lives With: Alone Available Help at Discharge: (daughter, Earlie Server will provide 24/7 assist) Type of Home: House Home Layout: One level Home Access: Ramped entrance Bathroom Shower/Tub: Chiropodist: Standard Bathroom Accessibility: Yes How Accessible: Accessible via walker Home Care Services: No Additional Comments: (they live seperately but would provide assist)  Discharge Living Setting Plans for Discharge Living Setting: Patient's home, Alone Type of Home at Discharge: House Discharge Home Layout: One level Discharge Home Access: [place] entrance Discharge Bathroom Shower/Tub: Tub/shower unit, Curtain Discharge Bathroom Toilet: Standard Discharge Bathroom Accessibility: Yes How Accessible: Accessible via walker Does the patient have any problems obtaining your medications?: No  Social/Family/Support Systems Patient Roles: Parent Contact Information: [name], daughter Anticipated Caregiver: daughter and son Anticipated Caregiver's Contact Information: see above Ability/Limitations of Caregiver: no limitations Caregiver Availability: 24/7 Discharge Plan Discussed with Primary Caregiver: Yes Is Caregiver In Agreement with Plan?: Yes Does Caregiver/Family have Issues with Lodging/Transportation while Pt is in Rehab?:  No  Goals/Additional Needs Patient/Family Goal for Rehab: supervision to min with PT and OT, supervision with SLP Expected length of stay: ELOS 14 to 20 days Additional Information: Patietn alert and oriented pta; very confused since CVA per daughter Pt/Family Agrees to Admission and willing to participate: Yes Program Orientation Provided & Reviewed with Pt/Caregiver Including Roles  & Responsibilities: Yes  Decrease burden of Care through IP rehab admission: n/a  Possible need for SNF placement upon discharge:  Not anticipated  Patient Condition: I have reviewed medical records from [facility] , spoken with CM, and patient and daughter. I met with patient at the bedside for inpatient rehabilitation assessment.  Patient will benefit from ongoing PT, OT and SLP,  can actively participate in 3 hours of therapy a day 5 days of the week, and can make measurable gains during the admission.  Patient will also benefit from the coordinated team approach during an Inpatient Acute Rehabilitation admission.  The patient will receive intensive therapy as well as Rehabilitation physician, nursing, social worker, and care management interventions.  Due to bladder management, bowel management, safety, skin/wound care, disease management, medication administration, pain management and patient education the patient requires 24 hour a day rehabilitation nursing.  The patient is currently mod to max assist with mobility and basic ADLs.  Discharge setting and therapy post discharge at home with home health is anticipated.  Patient has agreed to participate in the Acute Inpatient Rehabilitation Program and will admit today.  Preadmission Screen Completed By:  Cleatrice Burke, 02/11/2019 10:59 AM ______________________________________________________________________   Discussed status with Dr. Naaman Plummer  on  02/11/2019 at  1059 and received approval for admission today.  Admission Coordinator:   Cleatrice Burke, RN, time 1059 Date 02/11/2019  Assessment/Plan: Diagnosis: right watershed infarct with left hemiparesis 1. Does the need for close, 24 hr/day Medical supervision in concert with the patient's rehab needs make it unreasonable for this patient to be served in a less intensive setting? Yes 2. Co-Morbidities requiring supervision/potential complications: uti, htn, dysphagia 3. Due to bladder management, bowel management, safety, skin/wound care, disease management, medication administration, pain management and patient education, does the patient require 24 hr/day rehab nursing? Yes 4. Does the patient require coordinated care of a physician, rehab nurse, PT (1-2 hrs/day, 5 days/week), OT (1-2 hrs/day, 5 days/week) and SLP (1-2 hrs/day, 5 days/week) to address physical and functional deficits in the context of the above medical diagnosis(es)? Yes Addressing deficits in the following areas: balance, endurance, locomotion, strength, transferring, bowel/bladder control, bathing, dressing, feeding, grooming, toileting, cognition, speech, swallowing and psychosocial support 5. Can the patient actively participate in an intensive therapy program of at least 3 hrs of therapy 5 days a week? Yes 6. The potential for patient to make measurable gains while on inpatient rehab is excellent 7. Anticipated functional outcomes upon discharge from inpatients are: supervision and min assist PT, supervision and min assist OT, supervision SLP 8. Estimated rehab length of stay to reach the above functional goals is: 14-20 days 9. Anticipated D/C setting: Home 10. Anticipated post D/C treatments: [place] therapy 11. Overall Rehab/Functional Prognosis: excellent  MD Signature: Meredith Staggers, MD, [place] [facility] 02/11/2019         Revision History

## 2019-02-11 NOTE — H&P (Signed)
Physical Medicine and Rehabilitation Admission H&P     Chief complaint: Weakness HPI: Christy Carter. [name] is a 85 year old right-handed female with history of hyperlipidemia, , CVA maintained on aspirin, hypertension, gout.  She quit smoking 26 years ago.  Per chart review patient lives alone.  Independent with assistive device.  1 level home with ramped entrance.  Daughter and son provide assistance as needed.  Presented 02/02/2021 [facility] with recent fall as well as reports of some memory issues for the past 2 to 3 weeks.  Noted shortness of breath with noted hypoxia oxygen saturations 84%.  Urinalysis positive nitrite, chest x-ray mild pulmonary vascular congestion, cranial CT scan negative for acute changes, WBC 15,800, troponin negative, CK 327, lactic acid within normal limits, COVID negative, blood cultures no growth to date.  Patient was placed on Rocephin for UTI as well as suspected pneumonia.  Neurology consulted 02/05/2019 for reports of left-sided weakness.  Cranial CT scan repeated 02/05/2019 showing patchy hypodensity in the white matter right greater than left.  No midline shift.  MRI as well as MRA of head and neck showed acute infarct deep white matter on the right and a watershed territory likely due to severe stenosis of M1 segment.  Small acute infarct left lateral posterior temporal lobe.  Occluded left A1 segment.  Severe stenosis right M1 segment.  Patient was transferred to [facility] underwent balloon angioplasty x3 of preocclusive symptomatic right MCA M1 segment per interventional radiology.  Echocardiogram with ejection fraction of 70% without emboli.  Follow-up MRI after angioplasty showed new punctate acute early subacute infarct within the right temporal occipital lobe.  Repeat MRSA 02/07/2019 persistent right M1 focal stenosis.  Patient presently on low-dose aspirin 81 mg daily as well as Brilinta for CVA prophylaxis.  Subcutaneous heparin for DVT prophylaxis.  Dysphasia #3  nectar thick liquid diet.  Hospital course bouts of restlessness and agitation she was placed on low-dose nightly Seroquel.  Latest follow-up urine study culture multi-species Rocephin has been completed.  Leukocytosis improved 7.3.  Therapy evaluations completed and patient was admitted for a comprehensive rehab program   Review of Systems  Constitutional: Negative for chills and fever.  HENT: Negative for hearing loss.   Eyes: Negative for blurred vision and double vision.  Respiratory: Positive for shortness of breath. Negative for cough.   Cardiovascular: Positive for leg swelling. Negative for chest pain and palpitations.  Gastrointestinal: Positive for constipation. Negative for heartburn and nausea.  Genitourinary: Negative for dysuria, flank pain and hematuria.  Musculoskeletal: Positive for back pain and falls.  Skin: Negative for rash.  Psychiatric/Behavioral: Positive for depression. The patient has insomnia.   All other systems reviewed and are negative.       Past Medical History:  Diagnosis Date  . Arthritis    . Carpal tunnel syndrome    . Complication of anesthesia      Difficulty waking up. Believes it took 2 days to wake up after her surgery.  . Depression    . Hyperlipidemia    . Hypertension    . Shortness of breath    . Stroke ([place])    . Thyroid disease          Past Surgical History:  Procedure Laterality Date  . APPENDECTOMY      . BACK SURGERY      . CHOLECYSTECTOMY      . HERNIA REPAIR      . IR PTA INTRACRANIAL   02/05/2019  .  RADIOLOGY WITH ANESTHESIA N/A 02/05/2019    Procedure: CODE STROKE;  Surgeon: Radiologist, Medication, MD;  Location: [facility];  Service: Radiology;  Laterality: N/A;   Family History  Problem Relation Age of Onset  . Heart disease Mother    . Hypertension Mother    . Diverticulitis Mother    . Heart disease Father    . Hypertension Father     Social History:  reports that she quit smoking about 26 years ago. Her smoking use  included cigarettes. She has never used smokeless tobacco. She reports that she does not drink alcohol or use drugs. Allergies: No Known Allergies       Medications Prior to Admission  Medication Sig Dispense Refill  . allopurinol (ZYLOPRIM) 100 MG tablet Take 2 tabs at bed time for gout 60 tablet 6  . aspirin 325 MG tablet Take 1 tablet (325 mg total) by mouth daily. 30 tablet 0  . bisoprolol-hydrochlorothiazide (ZIAC) 2.5-6.25 MG tablet Take 1 tablet by mouth daily. 30 tablet 3  . celecoxib (CELEBREX) 200 MG capsule Take 1 capsule (200 mg total) by mouth daily. 30 capsule 3  . colchicine 0.6 MG tablet Take 1 tablet (0.6 mg total) by mouth 2 (two) times daily. 10 tablet 1  . Cyanocobalamin (B-12) 2500 MCG TABS Take 2,500 mcg by mouth daily. As needed for lethargy      . gabapentin (NEURONTIN) 100 MG capsule Take 1 tab po twice a day for gout 60 capsule 0  . HYDROcodone-acetaminophen (NORCO/VICODIN) 5-325 MG tablet Take 1 tablet by mouth 3 (three) times daily as needed for moderate pain. 15 tablet 0  . ibuprofen (ADVIL) 200 MG tablet Take 200 mg by mouth every 6 (six) hours as needed.      Marland Kitchen levothyroxine (SYNTHROID, LEVOTHROID) 100 MCG tablet Take 1 tablet (100 mcg total) by mouth daily. (Patient taking differently: Take 100 mcg by mouth daily before breakfast. ) 90 tablet 3  . nitrofurantoin, macrocrystal-monohydrate, (MACROBID) 100 MG capsule Take 1 capsule (100 mg total) by mouth 2 (two) times daily. 14 capsule 0  . omeprazole (PRILOSEC) 40 MG capsule Take 1 capsule (40 mg total) by mouth daily. 90 capsule 2  . pneumococcal 23 valent vaccine (PNEUMOVAX 23) 25 MCG/0.5ML injection Inject 0.21ml IM once 0.5 mL 0  . potassium chloride (K-DUR) 10 MEQ tablet Take 10 mEq by mouth 2 (two) times daily.      Marland Kitchen triamterene-hydrochlorothiazide (MAXZIDE-25) 37.5-25 MG tablet TAKE 1 TABLET BY MOUTH EVERY DAY FOR BLOOD PRESSURE 90 tablet 1  . VENTOLIN HFA 108 (90 Base) MCG/ACT inhaler TAKE 2 PUFFS BY MOUTH  EVERY 6 HOURS AS NEEDED FOR WHEEZE OR SHORTNESS OF BREATH 18 Inhaler 1  . Vitamin D, Ergocalciferol, (DRISDOL) 1.25 MG (50000 UT) CAPS capsule Take 1 capsule (50,000 Units total) by mouth every 7 (seven) days. 10 capsule 0  . vitamin E 400 UNIT capsule Take 400 Units by mouth daily.         Drug Regimen Review Drug regimen was reviewed and remains appropriate with no significant issues identified   Home: Home Living Family/patient expects to be discharged to:: Private residence Living Arrangements: Alone Available Help at Discharge: (daughter, Earlie Server will provide 24/7 assist) Type of Home: House Home Access: Ramped entrance Home Layout: One level Bathroom Shower/Tub: Chiropodist: Standard Bathroom Accessibility: Yes Home Equipment: Environmental consultant - 4 wheels, Walker - 2 wheels Additional Comments: (they live seperately but would provide assist)  Lives With: Alone  Functional History: Prior Function Level of Independence: Independent with assistive device(s) Comments: heats with wood, would bring in her own wood, doesn't drive but would grocery shop herself via her dtr or son providing her a ride, uses a RW   Functional Status:  Mobility: Bed Mobility Overal bed mobility: Needs Assistance Bed Mobility: Supine to Sit Supine to sit: Max assist, HOB elevated Sit to supine: Max assist, +2 for physical assistance General bed mobility comments: cues for sequencing and assistance to bring hips to EOB and to reach bed rail going toward L side and assist to elevate trunk into sitting Transfers Overall transfer level: Needs assistance Transfer via Lift Equipment: [place] Transfers: Sit to/from Stand Sit to Stand: Max assist, +2 physical assistance, Mod assist General transfer comment: max A +2 from EOB and mod A +2 from [place] seat; +2 assist with use of bed pad to power up into standing  Ambulation/Gait General Gait Details: unable at this time     ADL: ADL Overall  ADL's : Needs assistance/impaired Eating/Feeding: Maximal assistance, Bed level Grooming: Wash/dry hands, Wash/dry face, Brushing hair, Maximal assistance, Sitting Grooming Details (indicate cue type and reason): Pt requiring hand over hand to perform. Assist to position comb into L hand. Able to make small combing motion with arm held in supported position of flexion. Assist also for throughness. General ADL Comments: Worked on incoporating LUE into grooming tasks and attending to left side.    Cognition: Cognition Overall Cognitive Status: Impaired/Different from baseline Arousal/Alertness: Awake/alert Orientation Level: Oriented to person, Oriented to place, Disoriented to time, Disoriented to situation Attention: Sustained Sustained Attention: Appears intact Memory: Impaired Memory Impairment: Decreased recall of new information, Decreased short term memory Awareness: Impaired Awareness Impairment: Intellectual impairment Problem Solving: Impaired Problem Solving Impairment: Functional basic Safety/Judgment: Impaired Cognition Arousal/Alertness: Awake/alert(drowsy) Behavior During Therapy: WFL for tasks assessed/performed Overall Cognitive Status: Impaired/Different from baseline Area of Impairment: Problem solving, Following commands Orientation Level: Disoriented to, Place, Time, Situation Current Attention Level: Focused Following Commands: Follows one step commands with increased time Safety/Judgement: Decreased awareness of safety, Decreased awareness of deficits Awareness: Intellectual Problem Solving: Slow processing, Requires verbal cues, Requires tactile cues, Difficulty sequencing General Comments: WFL for mobility tasks   Physical Exam: Blood pressure (!) 157/94, pulse 76, temperature 98.8 F (37.1 C), temperature source Oral, resp. rate 17, height 5\' 6"  (1.676 m), weight 111.9 kg, SpO2 96 %. Physical Exam  Constitutional: No distress.  obese  HENT:  Head:  Normocephalic and atraumatic.  Eyes: Pupils are equal, round, and reactive to light. EOM are normal.  Neck: Normal range of motion. No tracheal deviation present. No thyromegaly present.  Cardiovascular: Normal rate and regular rhythm.  Murmur heard. Respiratory: Effort normal. No respiratory distress. She has no wheezes.  GI: Soft. She exhibits no distension. There is no abdominal tenderness.  Musculoskeletal:        General: Edema present.  Neurological:  Pt lying in bed, daughter trying to feed food to her. Retaining bites of food in mouth, occasionally coughing on remnants. Oriented to person, place. Remembered me from yesterday. Still appears fatigued. RUE and RLE grossly 4- to 4/5. LUE and LLE 2/5, mild left inattention. Speech dysarthric. Sensed gross touch, decreased pain left arm and leg.    .  Skin: Skin is warm and dry.  A few scattered ecchymoses  Psychiatric:  Flat but cooperative, fatigued      Lab Results Last 48 Hours  No results found for this or any previous  visit (from the past 48 hour(s)).   Imaging Results (Last 48 hours)  No results found.           Medical Problem List and Plan: 1.  Left hemiplegia with dysphagia/dysarthria secondary to right watershed territory infarction in the setting of severe stenosis right M1 segment status post revascularization as well as small left lateral posterior temporal lobe infarct in right middle cerebellar peduncle infarct             -admit to inpatient rehab             -ELOS 14-20+ days, min assist to supervision goals 2.  Antithrombotics: -DVT/anticoagulation: Subcutaneous heparin             -antiplatelet therapy: Aspirin 81 mg daily, Brilinta 90 mg twice daily 3. Pain Management: Tylenol as needed 4. Mood: Seroquel 12.5 mg nightly             -antipsychotic agents: N/A 5. Neuropsych: This patient is intermittentlhy capable of making decisions on her own behalf. 6. Skin/Wound Care: Routine skin checks 7.  Fluids/Electrolytes/Nutrition: Routine in and outs with follow-up chemistries 8.  Dysphagia.  Dysphasia #3 nectar liquids currently             -pt was struggling with mastication and D3 textures through her mouth. Will downgrade to D2 and have spoke with SLP who will follow up in AM 9.  Hypertension.  Maxide 37.5-25 mg daily, clonidine patch 0.1 mg change every 7 days, Zebeta 2.5 mg daily.                -follow for pattern             -adjust regimen as needed 10.  Hyperlipidemia.  Lipitor 11.  Hypothyroidism.  Synthroid       Lavon Paganini Angiulli, PA-C 02/11/2019  I have personally performed a face to face diagnostic evaluation of this patient and formulated the key components of the plan.  Additionally, I have personally reviewed laboratory data, imaging studies, as well as relevant notes and concur with the physician assistant's documentation above.  The patient's status has not changed from the original H&P.  Any changes in documentation from the acute care chart have been noted above.  Meredith Staggers, MD, [facility]

## 2019-02-11 NOTE — Progress Notes (Signed)
Inpatient Rehabilitation Admissions Coordinator  Insurance has approved pt for an inpt rehab admit today. I met with patient at bedside and contacted her daughter, Earlie Server by phone. They are in agreement to admit. I have notified Burnetta Sabin, GNP and RN CM, Vida Roller. I will make the arrangements to admit today.  Danne Baxter, RN, MSN Rehab Admissions Coordinator [phone] 02/11/2019 10:40 AM

## 2019-02-12 ENCOUNTER — Inpatient Hospital Stay (HOSPITAL_COMMUNITY): Payer: Medicare Other | Admitting: Speech Pathology

## 2019-02-12 ENCOUNTER — Inpatient Hospital Stay (HOSPITAL_COMMUNITY): Payer: Medicare Other | Admitting: Physical Therapy

## 2019-02-12 ENCOUNTER — Inpatient Hospital Stay (HOSPITAL_COMMUNITY): Payer: Medicare Other

## 2019-02-12 MED ORDER — POTASSIUM CHLORIDE 20 MEQ/15ML (10%) PO SOLN
10.0000 meq | Freq: Two times a day (BID) | ORAL | Status: DC
  Administered 2019-02-12 – 2019-03-09 (×50): 10 meq via ORAL
  Filled 2019-02-12 (×50): qty 15

## 2019-02-12 NOTE — Evaluation (Signed)
Occupational Therapy Assessment and Plan  Patient Details  Name: Christy Carter MRN: [id number] Date of Birth: 06-12-1933  OT Diagnosis: cognitive deficits, hemiplegia affecting non-dominant side and muscle weakness (generalized) Rehab Potential: Rehab Potential (ACUTE ONLY): Fair ELOS: 3-4 weeks   Today's Date: 02/12/2019 OT Individual Time: 1100-1210 OT Individual Time Calculation (min): 70 min     Problem List:  Patient Active Problem List   Diagnosis Date Noted  . Acute bilat watershed infarction [facility]) 02/11/2019  . Agitation requiring sedation protocol 02/09/2019  . Dysphagia 02/09/2019  . Stroke (cerebrum) ([place]) - R watershed d/t large vessel dz & small L temp lobe & R cerebellar infarcts 02/05/2019  . Middle cerebral artery stenosis, right 02/05/2019  . Acute respiratory failure ([place]) 02/03/2019  . Acute respiratory failure with hypoxemia ([place]) 02/03/2019  . Abnormal renal function 01/19/2019  . Pain in left foot 12/19/2018  . Hematuria 08/02/2018  . Influenza A 03/02/2018  . Urinary tract infection without hematuria 03/02/2018  . Sepsis ([place]) 02/11/2018  . CAP (community acquired pneumonia) 02/11/2018  . CKD (chronic kidney disease), stage III 02/11/2018  . Memory loss, short term 02/10/2018  . Flu vaccine need 02/10/2018  . Chest pain 02/03/2018  . Acute upper respiratory infection 02/03/2018  . Gastroesophageal reflux disease without esophagitis 02/03/2018  . Encounter for general adult medical examination with abnormal findings 01/13/2018  . Chronic low back pain 01/13/2018  . Need for vaccination against Streptococcus pneumoniae using pneumococcal conjugate vaccine 7 01/13/2018  . Dysuria 01/13/2018  . 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 Pelvic pain 10/08/2017  . Gout 06/15/2017  . Mixed hyperlipidemia 06/15/2017  . Inflammatory polyarthropathy ([place]) 06/15/2017  . Neuralgia and neuritis, unspecified 06/15/2017  . Major depressive  disorder, recurrent, mild ([place]) 06/15/2017  . Other vitamin B12 deficiency anemias 06/15/2017  . Hypothyroidism 10/17/2008  . OVERWEIGHT/OBESITY 10/17/2008  . SLEEP APNEA, OBSTRUCTIVE 10/17/2008  . Essential hypertension 10/17/2008  . GALLSTONES 10/17/2008  . DYSPNEA 10/17/2008  . Obesity 10/17/2008    Past Medical History:  Past Medical History:  Diagnosis Date  . Arthritis   . Carpal tunnel syndrome   . Complication of anesthesia    Difficulty waking up. Believes it took 2 days to wake up after her surgery.  . Depression   . Hyperlipidemia   . Hypertension   . Shortness of breath   . Stroke ([place])   . Thyroid disease    Past Surgical History:  Past Surgical History:  Procedure Laterality Date  . APPENDECTOMY    . BACK SURGERY    . CHOLECYSTECTOMY    . HERNIA REPAIR    . IR CT HEAD LTD  02/05/2019  . IR PTA INTRACRANIAL  02/05/2019  . RADIOLOGY WITH ANESTHESIA N/A 02/05/2019   Procedure: CODE STROKE;  Surgeon: Radiologist, Medication, MD;  Location: [facility];  Service: Radiology;  Laterality: N/A;    Assessment & Plan Clinical Impression: Jaleena Viviani. Terwilliger is an 85 year old right-handed female with history of hyperlipidemia, , CVA maintained on aspirin, hypertension, gout. She quit smoking 26 years ago. Per chart review patient lives alone. Independent with assistive device. 1 level home with ramped entrance. Daughter and son provide assistance as needed. Presented 02/02/2021 [facility] with recent fall as well as reports of some memory issues for the past 2 to 3 weeks. Noted shortness of breath with noted hypoxia oxygen saturations 84%. Urinalysis positive nitrite, chest x-ray mild pulmonary vascular congestion, cranial CT scan negative for acute changes, WBC 15,800,  troponin negative, CK 327, lactic acid within normal limits, COVID negative, blood cultures no growth to date. Patient was placed on Rocephin for UTI as well as suspected pneumonia. Neurology consulted 02/05/2019 for  reports of left-sided weakness. Cranial CT scan repeated 02/05/2019 showing patchy hypodensity in the white matter right greater than left. No midline shift. MRI as well as MRA of head and neck showed acute infarct deep white matter on the right and a watershed territory likely due to severe stenosis of M1 segment. Small acute infarct left lateral posterior temporal lobe. Occluded left A1 segment. Severe stenosis right M1 segment. Patient was transferred to [facility] underwent balloon angioplasty x3 of preocclusive symptomatic right MCA M1 segment per interventional radiology. Echocardiogram with ejection fraction of 70% without emboli. Follow-up MRI after angioplasty showed new punctate acute early subacute infarct within the right temporal occipital lobe. Repeat MRSA 02/07/2019 persistent right M1 focal stenosis. Patient presently on low-dose aspirin 81 mg daily as well as Brilinta for CVA prophylaxis. Subcutaneous heparin for DVT prophylaxis. Dysphasia #3 nectar thick liquid diet. Hospital course bouts of restlessness and agitation she was placed on low-dose nightly Seroquel. Latest follow-up urine study culture multi-species Rocephin has been completed. Leukocytosis improved 7.3. Therapy evaluations completed and patient was admitted for a comprehensive rehab program. Patient transferred to [facility] on 02/11/2019 .    Patient currently requires max with basic self-care skills secondary to muscle weakness, decreased cardiorespiratoy endurance, unbalanced muscle activation, decreased coordination and decreased motor planning, decreased midline orientation, decreased attention to left and left side neglect, decreased initiation, decreased attention, decreased awareness, decreased problem solving, decreased safety awareness, decreased memory and delayed processing and decreased sitting balance, decreased standing balance, decreased postural control, hemiplegia and decreased balance strategies.   Prior to hospitalization, patient could complete ADLs with modified independent .  Patient will benefit from skilled intervention to decrease level of assist with basic self-care skills prior to discharge home with care partner.  Anticipate patient will require 24 hour supervision and minimal physical assistance and follow up home health.  OT - End of Session Activity Tolerance: Tolerates 10 - 20 min activity with multiple rests Endurance Deficit: Yes Endurance Deficit Description: generalized weakness OT Assessment Rehab Potential (ACUTE ONLY): Fair OT Barriers to Discharge: Incontinence;Weight OT Patient demonstrates impairments in the following area(s): Balance;Perception;Safety;Cognition;Skin Integrity;Edema;Endurance;Vision;Motor;Pain OT Basic ADL's Functional Problem(s): Grooming;Bathing;Dressing;Toileting OT Transfers Functional Problem(s): Toilet OT Additional Impairment(s): None OT Plan OT Intensity: Minimum of 1-2 x/day, 45 to 90 minutes OT Frequency: 5 out of 7 days OT Duration/Estimated Length of Stay: 3-4 weeks OT Treatment/Interventions: Balance/vestibular training;Discharge planning;Pain management;Self Care/advanced ADL retraining;Therapeutic Activities;UE/LE Coordination activities;Visual/perceptual remediation/compensation;Therapeutic Exercise;Skin care/wound managment;Functional mobility training;Cognitive remediation/compensation;Patient/family education;Disease mangement/prevention;Community reintegration;DME/adaptive equipment instruction;Neuromuscular re-education;Splinting/orthotics;Psychosocial support;UE/LE Strength taining/ROM;Wheelchair propulsion/positioning OT Self Feeding Anticipated Outcome(s): no goal set OT Basic Self-Care Anticipated Outcome(s): min A OT Toileting Anticipated Outcome(s): min A OT Bathroom Transfers Anticipated Outcome(s): min A OT Recommendation Recommendations for Other Services: Speech consult;Therapeutic Recreation consult Therapeutic  Recreation Interventions: Stress management Patient destination: Home Follow Up Recommendations: Home health OT Equipment Recommended: To be determined Equipment Details: Shower TBD d/t potentially landlord redoing entire bathroom   Skilled Therapeutic Intervention Skilled OT evaluation completed. Pt's daughter present throughout session and edu along with pt on ELOS, OT POC, goals, rehab expectations, CLOF, and potential equipment for home use. Pt completed bed mobility with mod A to roll R and L, and max A to come sitting EOB. LB ADLs completed bed level for energy conservation and required  max +2 assistance. Pt sat EOB with mod A for static sitting balance, requiring frequent cueing for maintaining midline orientation. Pt completed UB dressing with max A EOB. +2 STEDY used to transfer pt to w/c. Teds donned total A. Pt left sitting up with chair alarm belt fastened and daughter present, all needs met.   OT Evaluation Precautions/Restrictions  Precautions Precautions: Fall Restrictions Weight Bearing Restrictions: No General Chart Reviewed: Yes Family/Caregiver Present: Yes  Pain Pain Assessment Pain Scale: 0-10 Pain Score: 0-No pain Home Living/Prior Functioning Home Living Available Help at Discharge: Family, Available 24 hours/day Type of Home: House Home Access: Ramped entrance Home Layout: One level Bathroom Shower/Tub: Chiropodist: Standard Bathroom Accessibility: Yes  Lives With: Alone IADL History Homemaking Responsibilities: Yes Meal Prep Responsibility: Primary Laundry Responsibility: Primary Cleaning Responsibility: Primary Bill Paying/Finance Responsibility: Primary Shopping Responsibility: Secondary Current License: No Education: 10th Prior Function Level of Independence: Independent with basic ADLs, Independent with gait, Independent with transfers, Needs assistance with homemaking Shopping: Moderate Driving: No Vocation:  Retired Vision Baseline Vision/History: Wears glasses Wears Glasses: At all times Patient Visual Report: Blurring of vision Vision Assessment?: Yes Eye Alignment: Within Functional Limits Ocular Range of Motion: Within Functional Limits Alignment/Gaze Preference: Gaze right Tracking/Visual Pursuits: Left eye does not track laterally;Decreased smoothness of eye movement to LEFT inferior field;Decreased smoothness of eye movement to LEFT superior field;Requires cues, head turns, or add eye shifts to track Saccades: Decreased speed of saccadic movement Convergence: Within functional limits Perception  Perception: Impaired Inattention/Neglect: Impaired-to be further tested in functional context;Does not attend to left visual field Praxis Praxis: Impaired Praxis Impairment Details: Motor planning;Initiation Cognition Overall Cognitive Status: Impaired/Different from baseline Arousal/Alertness: Awake/alert Orientation Level: Person;Place;Situation Person: Oriented Place: Oriented Situation: Oriented Year: 2020 Month: November Day of Week: Incorrect(Friday) Memory: Impaired Memory Impairment: Decreased recall of new information Immediate Memory Recall: Sock;Bed;Blue Memory Recall Sock: Not able to recall Memory Recall Blue: Without Cue Memory Recall Bed: Not able to recall Attention: Sustained Sustained Attention: Appears intact Awareness: Impaired Awareness Impairment: Intellectual impairment Problem Solving: Impaired Problem Solving Impairment: Functional basic Safety/Judgment: Impaired Comments: left inattention Sensation Sensation Light Touch: Appears Intact Proprioception: Impaired by gross assessment Coordination Gross Motor Movements are Fluid and Coordinated: No Fine Motor Movements are Fluid and Coordinated: No Coordination and Movement Description: generalized weakness and L hemi Finger Nose Finger Test: Slow, undershoots on L Motor  Motor Motor:  Hemiplegia Motor - Skilled Clinical Observations: L hemi Mobility  Bed Mobility Bed Mobility: Rolling Right;Rolling Left;Right Sidelying to Sit Rolling Right: Moderate Assistance - Patient 50-74% Rolling Left: Moderate Assistance - Patient 50-74% Right Sidelying to Sit: Maximal Assistance - Patient 25-49% Transfers Sit to Stand: Dependent - mechanical lift;2 Helpers Stand to Sit: Dependent - mechanical lift;2 Helpers  Trunk/Postural Assessment  Cervical Assessment Cervical Assessment: Exceptions to WFL(head forward) Thoracic Assessment Thoracic Assessment: Exceptions to WFL(rounded shoulders with kyphotic posture) Lumbar Assessment Lumbar Assessment: Exceptions to WFL(posterior pelvic tilt, poor anterior weight shift) Postural Control Postural Control: Deficits on evaluation Trunk Control: requires cueing for EOB sitting balance, R and posterior lean Righting Reactions: delayed Protective Responses: inadequate  Balance Balance Balance Assessed: Yes Static Sitting Balance Static Sitting - Balance Support: Feet supported Static Sitting - Level of Assistance: 4: Min assist;3: Mod assist Static Sitting - Comment/# of Minutes: Pt requires frequent cueing for midline orientation while sitting, with fatigue she requires as much as mod A for balance Dynamic Sitting Balance Dynamic Sitting - Balance Support: Feet supported  Dynamic Sitting - Level of Assistance: 3: Mod assist Static Standing Balance Static Standing - Balance Support: During functional activity;Bilateral upper extremity supported Static Standing - Level of Assistance: 2: Max assist Static Standing - Comment/# of Minutes: in stedy Extremity/Trunk Assessment RUE Assessment RUE Assessment: Exceptions to [facility] General Strength Comments: 3+/5 strength LUE Assessment LUE Assessment: Exceptions to [facility] LUE Body System: Neuro Brunstrum levels for arm and hand: Arm;Hand Brunstrum level for arm: Stage IV Movement is deviating  from synergy Brunstrum level for hand: Stage VI Isolated joint movements LUE AROM (degrees) Left Shoulder Flexion: 75 Degrees Left Shoulder ABduction: 70 Degrees     Refer to Care Plan for Long Term Goals  Recommendations for other services: Neuropsych and Therapeutic Recreation  Stress management   Discharge Criteria: Patient will be discharged from OT if patient refuses treatment 3 consecutive times without medical reason, if treatment goals not met, if there is a change in medical status, if patient makes no progress towards goals or if patient is discharged from hospital.  The above assessment, treatment plan, treatment alternatives and goals were discussed and mutually agreed upon: by patient and by family  Curtis Sites 02/12/2019, 12:50 PM

## 2019-02-12 NOTE — Plan of Care (Signed)
  Problem: Consults Goal: RH STROKE PATIENT EDUCATION Description: See Patient Education module for education specifics  Outcome: Progressing   Problem: RH BOWEL ELIMINATION Goal: RH STG MANAGE BOWEL WITH ASSISTANCE Description: STG Manage Bowel with min Assistance. Outcome: Progressing   Problem: RH SAFETY Goal: RH STG ADHERE TO SAFETY PRECAUTIONS W/ASSISTANCE/DEVICE Description: STG Adhere to Safety Precautions With Cues and reminders. Outcome: Progressing   Problem: RH KNOWLEDGE DEFICIT Goal: RH STG INCREASE KNOWLEDGE OF HYPERTENSION Description: Daughter will be able to demonstrate management of htn using handouts and reading resources for medications , bp monitoring and dietary independently. Outcome: Progressing Goal: RH STG INCREASE KNOWLEDGE OF DYSPHAGIA/FLUID INTAKE Description: Daughter will be able to demonstrate management of dysphagia using handouts and reading resources for medications dietary restrictions independently. Outcome: Progressing Goal: RH STG INCREASE KNOWLEGDE OF HYPERLIPIDEMIA Description: Daughter will be able to demonstrate management of hld using handouts and reading resources for medications and dietary independently. Outcome: Progressing Goal: RH STG INCREASE KNOWLEDGE OF STROKE PROPHYLAXIS Description: Daughter will be able to demonstrate management of stroke using handouts and reading resources for medications , bp monitoring and dietary restrictions independently. Outcome: Progressing   Problem: RH BLADDER ELIMINATION Goal: RH STG MANAGE BLADDER WITH ASSISTANCE Description: STG Manage Bladder With min  Assistance Outcome: Not Progressing

## 2019-02-12 NOTE — Evaluation (Signed)
Speech Language Pathology Assessment and Plan  Patient Details  Name: Christy Carter MRN: [id number] Date of Birth: 06/01/1933  SLP Diagnosis: Dysphagia;Cognitive Impairments  Rehab Potential: Good ELOS: 14-20 days    Today's Date: 02/12/2019 SLP Individual Time: 0805-0900 SLP Individual Time Calculation (min): 55 min   Problem List:  Patient Active Problem List   Diagnosis Date Noted  . Acute bilat watershed infarction [facility]) 02/11/2019  . Agitation requiring sedation protocol 02/09/2019  . Dysphagia 02/09/2019  . Stroke (cerebrum) ([place]) - R watershed d/t large vessel dz & small L temp lobe & R cerebellar infarcts 02/05/2019  . Middle cerebral artery stenosis, right 02/05/2019  . Acute respiratory failure ([place]) 02/03/2019  . Acute respiratory failure with hypoxemia ([place]) 02/03/2019  . Abnormal renal function 01/19/2019  . Pain in left foot 12/19/2018  . Hematuria 08/02/2018  . Influenza A 03/02/2018  . Urinary tract infection without hematuria 03/02/2018  . Sepsis ([place]) 02/11/2018  . CAP (community acquired pneumonia) 02/11/2018  . CKD (chronic kidney disease), stage III 02/11/2018  . Memory loss, short term 02/10/2018  . Flu vaccine need 02/10/2018  . Chest pain 02/03/2018  . Acute upper respiratory infection 02/03/2018  . Gastroesophageal reflux disease without esophagitis 02/03/2018  . Encounter for general adult medical examination with abnormal findings 01/13/2018  . Chronic low back pain 01/13/2018  . Need for vaccination against Streptococcus pneumoniae using pneumococcal conjugate vaccine 7 01/13/2018  . Dysuria 01/13/2018  . 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 Pelvic pain 10/08/2017  . Gout 06/15/2017  . Mixed hyperlipidemia 06/15/2017  . Inflammatory polyarthropathy ([place]) 06/15/2017  . Neuralgia and neuritis, unspecified 06/15/2017  . Major depressive disorder, recurrent, mild ([place]) 06/15/2017  . Other vitamin B12 deficiency anemias  06/15/2017  . Hypothyroidism 10/17/2008  . OVERWEIGHT/OBESITY 10/17/2008  . SLEEP APNEA, OBSTRUCTIVE 10/17/2008  . Essential hypertension 10/17/2008  . GALLSTONES 10/17/2008  . DYSPNEA 10/17/2008  . Obesity 10/17/2008   Past Medical History:  Past Medical History:  Diagnosis Date  . Arthritis   . Carpal tunnel syndrome   . Complication of anesthesia    Difficulty waking up. Believes it took 2 days to wake up after her surgery.  . Depression   . Hyperlipidemia   . Hypertension   . Shortness of breath   . Stroke ([place])   . Thyroid disease    Past Surgical History:  Past Surgical History:  Procedure Laterality Date  . APPENDECTOMY    . BACK SURGERY    . CHOLECYSTECTOMY    . HERNIA REPAIR    . IR CT HEAD LTD  02/05/2019  . IR PTA INTRACRANIAL  02/05/2019  . RADIOLOGY WITH ANESTHESIA N/A 02/05/2019   Procedure: CODE STROKE;  Surgeon: Radiologist, Medication, MD;  Location: [place];  Service: Radiology;  Laterality: N/A;    Assessment / Plan / Recommendation Clinical Impression   Christy Carter is a 85 year old right-handed female with history of hyperlipidemia, , CVA maintained on aspirin, hypertension, gout. She quit smoking 26 years ago. Per chart review patient lives alone. Independent with assistive device. 1 level home with ramped entrance. Daughter and son provide assistance as needed. Presented 02/02/2021 [facility] with recent fall as well as reports of some memory issues for the past 2 to 3 weeks. Noted shortness of breath with noted hypoxia oxygen saturations 84%. Urinalysis positive nitrite, chest x-ray mild pulmonary vascular congestion, cranial CT scan negative for acute changes, WBC 15,800, troponin negative, CK 327, lactic acid within normal  limits, COVID negative, blood cultures no growth to date. Patient was placed on Rocephin for UTI as well as suspected pneumonia. Neurology consulted 02/05/2019 for reports of left-sided weakness. Cranial CT scan repeated 02/05/2019  showing patchy hypodensity in the white matter right greater than left. No midline shift. MRI as well as MRA of head and neck showed acute infarct deep white matter on the right and a watershed territory likely due to severe stenosis of M1 segment. Small acute infarct left lateral posterior temporal lobe. Occluded left A1 segment. Severe stenosis right M1 segment. Patient was transferred to [facility] underwent balloon angioplasty x3 of preocclusive symptomatic right MCA M1 segment per interventional radiology. Echocardiogram with ejection fraction of 70% without emboli. Follow-up MRI after angioplasty showed new punctate acute early subacute infarct within the right temporal occipital lobe. Repeat MRSA 02/07/2019 persistent right M1 focal stenosis. Patient presently on low-dose aspirin 81 mg daily as well as Brilinta for CVA prophylaxis. Subcutaneous heparin for DVT prophylaxis. Dysphasia #3 nectar thick liquid diet. Hospital course bouts of restlessness and agitation she was placed on low-dose nightly Seroquel. Latest follow-up urine study culture multi-species Rocephin has been completed. Leukocytosis improved 7.3. Therapy evaluations completed and patient was admitted for a comprehensive rehab program.  SLP evaluation was completed on 02/12/2019 with the following results:  Pt presents with s/s of a mild-moderate oropharyngeal dysphagia which appears consistent with most recent instrumental assessment.  Pt had no overt s/s of aspiration with dys 2 textures or nectar thick liquids but had difficulty masticating and clearing solids from the oral cavity which resulted in left anterior loss of boluses as well as left buccal residue.  Residue cleared with cues for use of a liquid wash.  Recommend that pt remain on currently prescribed diet (downgraded to dys 2 by medical team on admission to CIR prior to SLP evaluation due to concerns of pocketing) with trials of advanced consistencies with  SLP to continue working towards repeat instrumental assessment.   Pt also presents with moderate cognitive deficits, most notable being significant left inattention with right gaze preference which impacts her functional problem solving for even basic, familiar tasks.  Pt needed overall max assist to locate items to the left of midline on her breakfast tray or to make eye contact with therapist when seated on her left side.  Pt also presents with decreased recall of daily information which is exacerbated from her known baseline memory deficits.   Given the abovementioned deficits, pt would benefit from skilled ST while inpatient in order to maximize functional independence and reduce burden of care prior to discharge.  Anticipate that pt will need 24/7 supervision at discharge in addition to [place] follow up at next level of care.     Skilled Therapeutic Interventions          Cognitive-linguistic and bedside swallow evaluation completed with results and recommendations reviewed with patient.     SLP Assessment  Patient will need skilled Speech Lanaguage Pathology Services during CIR admission    Recommendations  SLP Diet Recommendations: Dysphagia 2 (Fine chop);Nectar Liquid Administration via: Cup Medication Administration: Crushed with puree Supervision: Full supervision/cueing for compensatory strategies Compensations: Lingual sweep for clearance of pocketing;Small sips/bites;Slow rate Postural Changes and/or Swallow Maneuvers: Seated upright 90 degrees Oral Care Recommendations: Oral care BID Patient destination: Home Follow up Recommendations: Home Health SLP;24 hour supervision/assistance Equipment Recommended: To be determined    SLP Frequency 3 to 5 out of 7 days   SLP Duration  SLP Intensity  SLP Treatment/Interventions 14-20 days  Minumum of 1-2 x/day, 30 to 90 minutes  Cognitive remediation/compensation;Cueing hierarchy;Functional tasks;Environmental  controls;Dysphagia/aspiration precaution training;Internal/external aids;Patient/family education    Pain Pain Assessment Pain Scale: 0-10 Pain Score: 0-No pain  Prior Functioning Cognitive/Linguistic Baseline: Baseline deficits Type of Home: House  Lives With: Alone Available Help at Discharge: Family;Available 24 hours/day Education: 10th  Short Term Goals: Week 1: SLP Short Term Goal 1 (Week 1): Pt will consume dys 2 textures and nectar thick liquids with supervision cues for use of swallowing precautions and minimal overt s/s of aspiration. SLP Short Term Goal 2 (Week 1): Pt will consume therapeutic trials of thin liquids with supervision cues for use of swallowing precautions and minimal overt s/s of aspiration over 2 consecutive sessions prior to repeat instrumental assessment. SLP Short Term Goal 3 (Week 1): Pt will locate items to the left of midline in >25% of opportunities during basic, familiar tasks with mod assist multimodal cues. SLP Short Term Goal 4 (Week 1): Pt will complete basic, familiar tasks wtih mod assist multimodal cues for functional problem solving. SLP Short Term Goal 5 (Week 1): Pt will utilize external aids to recall basic, daily information with mod assist verbal cues.  Refer to Care Plan for Long Term Goals  Recommendations for other services: None   Discharge Criteria: Patient will be discharged from SLP if patient refuses treatment 3 consecutive times without medical reason, if treatment goals not met, if there is a change in medical status, if patient makes no progress towards goals or if patient is discharged from hospital.  The above assessment, treatment plan, treatment alternatives and goals were discussed and mutually agreed upon: by patient  Emilio Math 02/12/2019, 12:20 PM

## 2019-02-12 NOTE — Evaluation (Signed)
Physical Therapy Assessment and Plan  Patient Details  Name: Christy Carter MRN: [id number] Date of Birth: Sep 27, 1933  PT Diagnosis: Abnormal posture, Abnormality of gait, Cognitive deficits, Difficulty walking, Hemiparesis non-dominant, Impaired cognition, Impaired sensation, Muscle weakness and Pain in R ankle Rehab Potential: Good ELOS: 3-4 weeks   Today's Date: 02/12/2019 PT Individual Time: 1305-1400 PT Individual Time Calculation (min): 55 min    Problem List:  Patient Active Problem List   Diagnosis Date Noted  . Acute bilat watershed infarction [facility]) 02/11/2019  . Agitation requiring sedation protocol 02/09/2019  . Dysphagia 02/09/2019  . Stroke (cerebrum) ([place]) - R watershed d/t large vessel dz & small L temp lobe & R cerebellar infarcts 02/05/2019  . Middle cerebral artery stenosis, right 02/05/2019  . Acute respiratory failure ([place]) 02/03/2019  . Acute respiratory failure with hypoxemia ([place]) 02/03/2019  . Abnormal renal function 01/19/2019  . Pain in left foot 12/19/2018  . Hematuria 08/02/2018  . Influenza A 03/02/2018  . Urinary tract infection without hematuria 03/02/2018  . Sepsis ([place]) 02/11/2018  . CAP (community acquired pneumonia) 02/11/2018  . CKD (chronic kidney disease), stage III 02/11/2018  . Memory loss, short term 02/10/2018  . Flu vaccine need 02/10/2018  . Chest pain 02/03/2018  . Acute upper respiratory infection 02/03/2018  . Gastroesophageal reflux disease without esophagitis 02/03/2018  . Encounter for general adult medical examination with abnormal findings 01/13/2018  . Chronic low back pain 01/13/2018  . Need for vaccination against Streptococcus pneumoniae using pneumococcal conjugate vaccine 7 01/13/2018  . Dysuria 01/13/2018  . 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 Pelvic pain 10/08/2017  . Gout 06/15/2017  . Mixed hyperlipidemia 06/15/2017  . Inflammatory polyarthropathy ([place]) 06/15/2017  . Neuralgia and  neuritis, unspecified 06/15/2017  . Major depressive disorder, recurrent, mild ([place]) 06/15/2017  . Other vitamin B12 deficiency anemias 06/15/2017  . Hypothyroidism 10/17/2008  . OVERWEIGHT/OBESITY 10/17/2008  . SLEEP APNEA, OBSTRUCTIVE 10/17/2008  . Essential hypertension 10/17/2008  . GALLSTONES 10/17/2008  . DYSPNEA 10/17/2008  . Obesity 10/17/2008    Past Medical History:  Past Medical History:  Diagnosis Date  . Arthritis   . Carpal tunnel syndrome   . Complication of anesthesia    Difficulty waking up. Believes it took 2 days to wake up after her surgery.  . Depression   . Hyperlipidemia   . Hypertension   . Shortness of breath   . Stroke ([place])   . Thyroid disease    Past Surgical History:  Past Surgical History:  Procedure Laterality Date  . APPENDECTOMY    . BACK SURGERY    . CHOLECYSTECTOMY    . HERNIA REPAIR    . IR CT HEAD LTD  02/05/2019  . IR PTA INTRACRANIAL  02/05/2019  . RADIOLOGY WITH ANESTHESIA N/A 02/05/2019   Procedure: CODE STROKE;  Surgeon: Radiologist, Medication, MD;  Location: [place];  Service: Radiology;  Laterality: N/A;    Assessment & Plan Clinical Impression: Patient is a 85 y.o. year old right-handed female with history of hyperlipidemia, , CVA maintained on aspirin, hypertension, gout. She quit smoking 26 years ago. Per chart review patient lives alone. Independent with assistive device. 1 level home with ramped entrance. Daughter and son provide assistance as needed. Presented 02/02/2021 [facility] with recent fall as well as reports of some memory issues for the past 2 to 3 weeks. Noted shortness of breath with noted hypoxia oxygen saturations 84%. Urinalysis positive nitrite, chest x-ray mild pulmonary vascular congestion, cranial  CT scan negative for acute changes, WBC 15,800, troponin negative, CK 327, lactic acid within normal limits, COVID negative, blood cultures no growth to date. Patient was placed on Rocephin for UTI as well as  suspected pneumonia. Neurology consulted 02/05/2019 for reports of left-sided weakness. Cranial CT scan repeated 02/05/2019 showing patchy hypodensity in the white matter right greater than left. No midline shift. MRI as well as MRA of head and neck showed acute infarct deep white matter on the right and a watershed territory likely due to severe stenosis of M1 segment. Small acute infarct left lateral posterior temporal lobe. Occluded left A1 segment. Severe stenosis right M1 segment. Patient was transferred to [facility] underwent balloon angioplasty x3 of preocclusive symptomatic right MCA M1 segment per interventional radiology. Echocardiogram with ejection fraction of 70% without emboli. Follow-up MRI after angioplasty showed new punctate acute early subacute infarct within the right temporal occipital lobe. Repeat MRSA 02/07/2019 persistent right M1 focal stenosis. Patient presently on low-dose aspirin 81 mg daily as well as Brilinta for CVA prophylaxis. Subcutaneous heparin for DVT prophylaxis. Dysphasia #3 nectar thick liquid diet. Hospital course bouts of restlessness and agitation she was placed on low-dose nightly Seroquel. Latest follow-up urine study culture multi-species Rocephin has been completed. Leukocytosis improved 7.3. Therapy evaluations completed and patient was admitted for a comprehensive rehab program. Patient transferred to CIR on 02/11/2019 .   Patient currently requires total with mobility secondary to muscle weakness, decreased cardiorespiratoy endurance and decreased oxygen support, impaired timing and sequencing, unbalanced muscle activation, motor apraxia and decreased motor planning, decreased midline orientation and decreased attention to left, decreased awareness, decreased problem solving, decreased memory and delayed processing and decreased sitting balance, decreased standing balance, decreased postural control and decreased balance strategies.  Prior  to hospitalization, patient was modified independent  with mobility and lived with Alone in a House home.  Home access is  Ramped entrance.  Patient will benefit from skilled PT intervention to maximize safe functional mobility, minimize fall risk and decrease caregiver burden for planned discharge home with 24 hour assist.  Anticipate patient will benefit from follow up [facility] at discharge.  PT - End of Session Activity Tolerance: Tolerates 30+ min activity with multiple rests Endurance Deficit: Yes Endurance Deficit Description: requests to return to bed at end of session to rest PT Assessment Rehab Potential (ACUTE/IP ONLY): Good PT Patient demonstrates impairments in the following area(s): Balance;Perception;Safety;Behavior;Edema;Sensory;Endurance;Motor;Pain;Skin Integrity PT Transfers Functional Problem(s): Bed Mobility;Bed to Chair;Car;Furniture PT Locomotion Functional Problem(s): Ambulation;Wheelchair Mobility;Stairs PT Plan PT Intensity: Minimum of 1-2 x/day ,45 to 90 minutes PT Frequency: 5 out of 7 days PT Duration Estimated Length of Stay: 3-4 weeks PT Treatment/Interventions: Ambulation/gait training;Community reintegration;DME/adaptive equipment instruction;Neuromuscular re-education;Psychosocial support;Stair training;UE/LE Strength taining/ROM;Wheelchair propulsion/positioning;Balance/vestibular training;Discharge planning;Functional electrical stimulation;Pain management;Skin care/wound management;Therapeutic Activities;UE/LE Coordination activities;Cognitive remediation/compensation;Disease management/prevention;Functional mobility training;Patient/family education;Splinting/orthotics;Therapeutic Exercise;Visual/perceptual remediation/compensation PT Transfers Anticipated Outcome(s): min assist PT Locomotion Anticipated Outcome(s): min assist short distances using LRAD PT Recommendation Recommendations for Other Services: Neuropsych consult Follow Up Recommendations: Home health  PT;24 hour supervision/assistance Patient destination: Home Equipment Recommended: To be determined  Skilled Therapeutic Intervention Evaluation completed (see details above and below) with education on PT POC and goals and individual treatment initiated with focus on bed mobility, transfers, activity tolerance, and sitting balance, as well as pt/family education regarding daily therapy schedule, weekly team meetings, purpose of PT evaluation, and other CIR information. Pt received sitting in w/c with her daughter and NT present assisting with completing meal. Pt agreeable to  therapy session and requesting to use bathroom. Attempted sit>stand w/c>stedy with +1 max assist but pt unable to clear bottom from w/c therefore therapist retrieved 2nd person for +2 max assist and pt able to stand with assist for lifting and manual facilitation for increased trunk upright (pt's daughter states pt has walked bent forward for a long time). Stedy transfer w/c>BSC. Sit<>stand from stedy seat and to/from [facility] all with +2 max assist for lifting/lowering. Standing in stedy with mod assist while 2nd person completed total assist LB clothing management and peri-care - pt continent of urine. Pt reporting fatigue and requesting return to bed. Stedy transfer BSC>EOB. Sitting EOB performed dynamic sitting balance and L UE NMR task of repeated L lateral trunk lean onto forearm support with return to sitting upright in midline, min/mod assist for trunk support - multimodal cuing for return to midline. Sit>supine with max assist for trunk descent and B LE management. Pt left supine in bed with needs in reach, bed alarm on, and her daughter present.   PT Evaluation Precautions/Restrictions Precautions Precautions: Fall;Other (comment) Precaution Comments: L inattention Restrictions Weight Bearing Restrictions: No Pain Pain Assessment Pain Scale: 0-10 Pain Score: (states "dont hurt that bad but you know it is there" described  as "tender") Pain Type: Acute pain Pain Location: Ankle Pain Orientation: Right Pain Descriptors / Indicators: Aching Pain Intervention(s): RN made aware;Rest;Repositioned Home Living/Prior Functioning Home Living Available Help at Discharge: Family;Available 24 hours/day(daughter and son) Type of Home: House Home Access: Ramped entrance Home Layout: One level Bathroom Shower/Tub: Chiropodist: Standard Bathroom Accessibility: Yes  Lives With: Alone Prior Function Level of Independence: Independent with homemaking with ambulation;Independent with gait;Independent with transfers(pt's daughter reports pt retrieved her own wood from the wood box and performed all household tasks without assist) Shopping: Moderate Driving: No Vocation: Retired Comments: heats with wood, would bring in her own wood, doesn't drive but would grocery shop herself via her dtr or son providing her a ride, uses a Retail [employer] Perception: Impaired Inattention/Neglect: Impaired-to be further tested in functional context;Does not attend to left visual field Praxis Praxis: Impaired Praxis Impairment Details: Motor planning;Initiation  Cognition Overall Cognitive Status: Impaired/Different from baseline Arousal/Alertness: Awake/alert Orientation Level: Oriented to person;Disoriented to time;Disoriented to place;Oriented to situation(pt knows she is in a hospital but [facility] and reports it is [facility]) Attention: Focused;Sustained Focused Attention: Appears intact Sustained Attention: Appears intact Memory: Impaired Awareness: Impaired Problem Solving: Impaired Safety/Judgment: Impaired Sensation Sensation Light Touch: Impaired Detail Light Touch Impaired Details: Impaired LLE Hot/Cold: Not tested Proprioception: Impaired by gross assessment Stereognosis: Not tested Coordination Gross Motor Movements are Fluid and Coordinated: No Fine  Motor Movements are Fluid and Coordinated: No Coordination and Movement Description: generalized weakness and L hemi Motor  Motor Motor: Hemiplegia Motor - Skilled Clinical Observations: L hemi  Mobility Bed Mobility Bed Mobility: Sit to Supine Sit to Supine: Maximal Assistance - Patient 25-49% Transfers Transfers: Sit to Stand;Stand to Sit;Transfer Sit to Stand: Dependent - mechanical lift;2 Helpers Stand to Sit: Dependent - mechanical lift;2 Helpers Transfer (Assistive device): Other (Comment)(stedy) Transfer via Lift Equipment: Animal nutritionist: No Gait Gait: No Stairs / Additional Locomotion Stairs: No Product manager Mobility: No  Trunk/Postural Assessment  Cervical Assessment Cervical Assessment: Exceptions to WFL(head forward) Thoracic Assessment Thoracic Assessment: Exceptions to WFL(rounded shoulders with kyphotic posture) Lumbar Assessment Lumbar Assessment: Exceptions to WFL(posterior pelvic tilt, poor anterior weight shift) Postural Control Postural  Control: Deficits on evaluation Trunk Control: requires cueing for EOB sitting balance Righting Reactions: delayed Protective Responses: inadequate  Balance Balance Balance Assessed: Yes Static Sitting Balance Static Sitting - Balance Support: Feet supported Static Sitting - Level of Assistance: 4: Min assist;3: Mod assist Dynamic Sitting Balance Dynamic Sitting - Balance Support: Feet supported Dynamic Sitting - Level of Assistance: 3: Mod assist Static Standing Balance Static Standing - Balance Support: During functional activity;Bilateral upper extremity supported Static Standing - Level of Assistance: 2: Max assist Extremity Assessment  RLE Assessment RLE Assessment: Exceptions to [facility] General Strength Comments: despite strength scores of 4/5, assessed in sitting pt, demonstrates significantly greater functional strength impairments requiring +2 dependent assist for lifting  into standing RLE Strength Right Hip Flexion: 4/5 Right Knee Flexion: 4/5 Right Knee Extension: 4/5 Right Ankle Dorsiflexion: 4-/5 Right Ankle Plantar Flexion: 4-/5 LLE Assessment LLE Assessment: Exceptions to [facility] General Strength Comments: despite strength scores of 4/5, assessed in sitting pt, demonstrates significantly greater functional strength impairments requiring +2 dependent assist for lifting into standing LLE Strength Left Hip Flexion: 4/5 Left Knee Flexion: 4/5 Left Knee Extension: 4/5 Left Ankle Dorsiflexion: 4-/5 Left Ankle Plantar Flexion: 4-/5    Refer to Care Plan for Long Term Goals  Recommendations for other services: Neuropsych  Discharge Criteria: Patient will be discharged from PT if patient refuses treatment 3 consecutive times without medical reason, if treatment goals not met, if there is a change in medical status, if patient makes no progress towards goals or if patient is discharged from hospital.  The above assessment, treatment plan, treatment alternatives and goals were discussed and mutually agreed upon: by patient and by family  Tawana Scale, PT, DPT 02/12/2019, 1:03 PM

## 2019-02-12 NOTE — Progress Notes (Signed)
[facility] PHYSICAL MEDICINE & REHABILITATION PROGRESS NOTE   Subjective/Complaints:  Pt reports she needs to void/pee- otherwise, denies any issues.    ROS_ denies SOB, CP, N/V/D.   Objective:   Dg Chest 2 View  Result Date: 02/11/2019 CLINICAL DATA:  Shortness of breath and chest tightness today. EXAM: CHEST - 2 VIEW COMPARISON:  PA and lateral chest 02/08/2019. Single-view of the chest 02/28/2018. FINDINGS: There is cardiomegaly and mild interstitial edema. Small bilateral pleural effusions are seen. No acute or focal bony abnormality. IMPRESSION: Cardiomegaly and mild interstitial edema with associated small pleural effusions. Electronically Signed   By: Christy Carter M.D.   On: 02/11/2019 16:50   Dg Swallowing Func-speech Pathology  Result Date: 02/11/2019 Objective Swallowing Evaluation: Type of Study: MBS-Modified Barium Swallow Study  Patient Details Name: Christy Carter MRN: [id number] Date of Birth: 11-09-1933 Today's Date: 02/11/2019 Time: SLP Start Time (ACUTE ONLY): 1320 -SLP Stop Time (ACUTE ONLY): 1340 SLP Time Calculation (min) (ACUTE ONLY): 20 min Past Medical History: Past Medical History: Diagnosis Date . Arthritis  . Carpal tunnel syndrome  . Complication of anesthesia   Difficulty waking up. Believes it took 2 days to wake up after her surgery. . Depression  . Hyperlipidemia  . Hypertension  . Shortness of breath  . Stroke ([place])  . Thyroid disease  Past Surgical History: Past Surgical History: Procedure Laterality Date . APPENDECTOMY   . BACK SURGERY   . CHOLECYSTECTOMY   . HERNIA REPAIR   . IR CT HEAD LTD  02/05/2019 . IR PTA INTRACRANIAL  02/05/2019 . RADIOLOGY WITH ANESTHESIA N/A 02/05/2019  Procedure: CODE STROKE;  Surgeon: Radiologist, Medication, MD;  Location: [place];  Service: Radiology;  Laterality: N/A; HPI: Christy Carter is a 85 y.o. female PMH of sleep apnea, HTN, HLD, carpal tunnel, stroke who presented to [facility] on 9/24 for SOB. She was admitted by the medical services  there, noted to have a pneumonia/UTI. She was noted to have some left sided weakness on initial exam, and again today, with LKW documented to be 0200 02/05/2019. Further imaging was done in the form of MRI of the brain after a CT showed no acute changes.  The MRI brain showed bilateral small acute infarct with the largest being in the deep white matter watershed territory on the right. The neurologist over at [facility] regional discussed with the family who wanted aggressive care and she was transferred directly to the IR suite at [facility].  CXR 9/24: vascular congestion; "Streaky bibasilar opacities, favor atelectasis".  MRI 9/26:  "  1. Acute infarct deep white matter on the right in a watershed territory, likely due to severe stenosis right M1 segment. 2. Small acute infarct left lateral posterior temporal lobe 3. Carotid bifurcation widely patent in the neck. Mild to moderate stenosis of the left petrous carotid. 4. Left vertebral artery widely patent. Right vertebral artery severely diseased with long segment occlusion and partial reconstitution distally. 5. Moderate to severe stenosis right P1. Severe stenosis right P2 segment. Moderate stenosis left P1 segment. 6. Occluded left A1 segment. Severe stenosis right M1 segment. Mild to moderate stenosis left M1."  Subjective: Pt awake, alert, pleasant, participative Assessment / Plan / Recommendation CHL IP CLINICAL IMPRESSIONS 02/11/2019 Clinical Impression Pt demonstrates a mild oropharyngeal dysphagia with slightly delayed laryngeal closure. Laryngeal elevation is timely, but the vestibule remains exposed as thin liquids reach the pyriforms allowing for sensed aspiration events during the swallow, just before hyoid excursion and epiglottic  deflection. Most of pts subglottis was obscured by her shoulders despite repositioning, so the presence or absence of aspiration could not be seen in many trials. However pt had several visable episodes of  aspiration with thin liquids via cup or straw and still had visible (at least) penetration with a chin tuck. Pt was able to consistently protect the airway with nectar, one sip at a time. Recommend pt continue nectar thick liquids and mechanical soft solids. Suspect that with training for an immediate protective throat clear or cough pt could reliably protect airway with thin liquids.  SLP Visit Diagnosis -- Attention and concentration deficit following -- Frontal lobe and executive function deficit following -- Impact on safety and function --   CHL IP TREATMENT RECOMMENDATION 02/11/2019 Treatment Recommendations Therapy as outlined in treatment plan below   Prognosis 02/06/2019 Prognosis for Safe Diet Advancement Good Barriers to Reach Goals -- Barriers/Prognosis Comment -- CHL IP DIET RECOMMENDATION 02/11/2019 SLP Diet Recommendations Dysphagia 3 (Mech soft) solids;Nectar thick liquid Liquid Administration via Cup;Straw Medication Administration Whole meds with puree Compensations Lingual sweep for clearance of pocketing;Small sips/bites;Slow rate Postural Changes --   CHL IP OTHER RECOMMENDATIONS 02/11/2019 Recommended Consults -- Oral Care Recommendations Oral care BID Other Recommendations --   CHL IP FOLLOW UP RECOMMENDATIONS 02/11/2019 Follow up Recommendations Inpatient Rehab;24 hour supervision/assistance   CHL IP FREQUENCY AND DURATION 02/11/2019 Speech Therapy Frequency (ACUTE ONLY) min 2x/week Treatment Duration 2 weeks      CHL IP ORAL PHASE 02/11/2019 Oral Phase Impaired Oral - Pudding Teaspoon -- Oral - Pudding Cup -- Oral - Honey Teaspoon -- Oral - Honey Cup NT Oral - Nectar Teaspoon -- Oral - Nectar Cup -- Oral - Nectar Straw WFL Oral - Thin Teaspoon -- Oral - Thin Cup WFL Oral - Thin Straw WFL Oral - Puree WFL Oral - Mech Soft -- Oral - Regular Impaired mastication;Delayed oral transit Oral - Multi-Consistency -- Oral - Pill NT Oral Phase - Comment --  CHL IP PHARYNGEAL PHASE 02/11/2019 Pharyngeal Phase  Impaired Pharyngeal- Pudding Teaspoon -- Pharyngeal -- Pharyngeal- Pudding Cup -- Pharyngeal -- Pharyngeal- Honey Teaspoon -- Pharyngeal -- Pharyngeal- Honey Cup NT Pharyngeal -- Pharyngeal- Nectar Teaspoon -- Pharyngeal -- Pharyngeal- Nectar Cup -- Pharyngeal -- Pharyngeal- Nectar Straw Reduced airway/laryngeal closure;Penetration/Aspiration during swallow Pharyngeal Material enters airway, remains ABOVE vocal cords then ejected out;Material does not enter airway Pharyngeal- Thin Teaspoon -- Pharyngeal -- Pharyngeal- Thin Cup Penetration/Aspiration before swallow;Penetration/Aspiration during swallow;Reduced airway/laryngeal closure Pharyngeal Material enters airway, CONTACTS cords and then ejected out Pharyngeal- Thin Straw Reduced airway/laryngeal closure;Penetration/Aspiration before swallow;Penetration/Aspiration during swallow Pharyngeal Material enters airway, passes BELOW cords then ejected out Pharyngeal- Puree WFL Pharyngeal Material does not enter airway Pharyngeal- Mechanical Soft -- Pharyngeal -- Pharyngeal- Regular WFL Pharyngeal Material does not enter airway Pharyngeal- Multi-consistency -- Pharyngeal -- Pharyngeal- Pill NT Pharyngeal -- Pharyngeal Comment --  CHL IP CERVICAL ESOPHAGEAL PHASE 02/06/2019 Cervical Esophageal Phase WFL Pudding Teaspoon -- Pudding Cup -- Honey Teaspoon -- Honey Cup -- Nectar Teaspoon -- Nectar Cup -- Nectar Straw -- Thin Teaspoon -- Thin Cup -- Thin Straw -- Puree -- Mechanical Soft -- Regular -- Multi-consistency -- Pill -- Cervical Esophageal Comment -- Herbie Baltimore, MA CCC-SLP Acute Rehabilitation Services Pager [phone] Office [phone] Lynann Beaver 02/11/2019, 2:14 PM              Recent Labs    02/11/19 1646  WBC 9.6  HGB 13.0  HCT 40.3  PLT 337   Recent Labs  02/11/19 1646  CREATININE 0.96    Intake/Output Summary (Last 24 hours) at 02/12/2019 1534 Last data filed at 02/12/2019 1500 Gross per 24 hour  Intake 180 ml  Output 5  ml  Net 175 ml     Physical Exam: Vital Signs Blood pressure (!) 133/46, pulse 63, temperature 97.9 F (36.6 C), temperature source Oral, resp. rate 18, height 5\' 7"  (1.702 m), weight 104.8 kg, SpO2 95 %. Vitals reviewed Constitutional:No distress. obese; sitting up in chair at bedside, nursing in room, NAD HENT:  Head:Normocephalicand atraumatic.  Eyes:EOMare normal.  Neck:Normal range of motion.No tracheal deviationpresent. Cardiovascular:Normal rateand regular rhythm. Murmurheard. Respiratory:Effort normal. Norespiratory distress. She hasno wheezes.  [id number]. She exhibitsno distension. There isno abdominal tenderness.  Musculoskeletal:  General: Edemapresent.  Neurological: Pt lying in bed. Still appears fatigued. RUE and RLE grossly 4- to 4/5. LUE and LLE 2/5, mild left inattention. Speech dysarthric. Sensed gross touch, decreased pain left arm and leg.  Skin: Skin iswarmand dry. A few scattered ecchymoses Psychiatric:  Flat but cooperative, fatigued- asking to void/nursing assistance     Assessment/Plan: 1. Functional deficits secondary to Left hemiplegia with dysphagia/dysarthriasecondary to right watershed territory infarction in the setting of severe stenosis right M1 segment status post revascularization which require 3+ hours per day of interdisciplinary therapy in a comprehensive inpatient rehab setting.  Physiatrist is providing close team supervision and 24 hour management of active medical problems listed below.  Physiatrist and rehab team continue to assess barriers to discharge/monitor patient progress toward functional and medical goals  Care Tool:  Bathing  Bathing activity did not occur: Safety/medical concerns           Bathing assist       Upper Body Dressing/Undressing Upper body dressing   What is the patient wearing?: Dress    Upper body assist Assist Level: Maximal Assistance - Patient 25 - 49%     Lower Body Dressing/Undressing Lower body dressing      What is the patient wearing?: Incontinence brief     Lower body assist Assist for lower body dressing: 2 Helpers     Toileting Toileting    Toileting assist Assist for toileting: 2 Helpers     Transfers Chair/bed transfer  Transfers assist     Chair/bed transfer assist level: Dependent - mechanical lift     Locomotion Ambulation   Ambulation assist              Walk 10 feet activity   Assist           Walk 50 feet activity   Assist           Walk 150 feet activity   Assist           Walk 10 feet on uneven surface  activity   Assist           Wheelchair     Assist               Wheelchair 50 feet with 2 turns activity    Assist            Wheelchair 150 feet activity     Assist          Blood pressure (!) 133/46, pulse 63, temperature 97.9 F (36.6 C), temperature source Oral, resp. rate 18, height 5\' 7"  (1.702 m), weight 104.8 kg, SpO2 95 %.  Medical Problem List and Plan: 1.Left hemiplegia with dysphagia/dysarthriasecondary to right watershed territory infarction in the setting of  severe stenosis right M1 segment status post revascularization as well as small left lateral posterior temporal lobe infarct in right middle cerebellar peduncle infarct -admit to inpatient rehab -ELOS 14-20+ days, min assist to supervision goals 2. Antithrombotics: -DVT/anticoagulation:Subcutaneous heparin -antiplatelet therapy: Aspirin 81 mg daily, Brilinta 90 mg twice daily 3. Pain Management:Tylenol as needed 4. Mood:Seroquel 12.5 mg nightly -antipsychotic agents: N/A 5. Neuropsych: This patientisintermittentlycapable of making decisions onherown behalf. 6. Skin/Wound Care:Routine skin checks 7. Fluids/Electrolytes/Nutrition:Routine in and outs with follow-up chemistries 8. Dysphagia.  Dysphasia #3 nectar liquids currently -pt was struggling with mastication and D3 textures through her mouth. Will downgrade to D2 and have spoke with SLP who will follow up in AM 9. Hypertension. Maxide 37.5-25 mg daily, clonidine patch 0.1 mg change every 7 days, Zebeta 2.5 mg daily.  -follow for pattern -adjust regimen as needed 10. Hyperlipidemia. Lipitor 11. Hypothyroidism. Synthroid      LOS: 1 days A FACE TO FACE EVALUATION WAS PERFORMED  Graylon Amory 02/12/2019, 3:34 PM

## 2019-02-13 ENCOUNTER — Inpatient Hospital Stay (HOSPITAL_COMMUNITY): Payer: Medicare Other

## 2019-02-13 MED ORDER — DICLOFENAC SODIUM 1 % TD GEL
4.0000 g | Freq: Four times a day (QID) | TRANSDERMAL | Status: DC
  Administered 2019-02-13 – 2019-03-09 (×54): 4 g via TOPICAL
  Filled 2019-02-13: qty 100

## 2019-02-13 NOTE — Plan of Care (Signed)
  Problem: Consults Goal: RH STROKE PATIENT EDUCATION Description: See Patient Education module for education specifics  Outcome: Progressing   Problem: RH BOWEL ELIMINATION Goal: RH STG MANAGE BOWEL WITH ASSISTANCE Description: STG Manage Bowel with min Assistance. Outcome: Progressing   Problem: RH BLADDER ELIMINATION Goal: RH STG MANAGE BLADDER WITH ASSISTANCE Description: STG Manage Bladder With min  Assistance Outcome: Progressing   Problem: RH SAFETY Goal: RH STG ADHERE TO SAFETY PRECAUTIONS W/ASSISTANCE/DEVICE Description: STG Adhere to Safety Precautions With Cues and reminders. Outcome: Progressing   Problem: RH KNOWLEDGE DEFICIT Goal: RH STG INCREASE KNOWLEDGE OF HYPERTENSION Description: Daughter will be able to demonstrate management of htn using handouts and reading resources for medications , bp monitoring and dietary independently. Outcome: Progressing Goal: RH STG INCREASE KNOWLEDGE OF DYSPHAGIA/FLUID INTAKE Description: Daughter will be able to demonstrate management of dysphagia using handouts and reading resources for medications dietary restrictions independently. Outcome: Progressing Goal: RH STG INCREASE KNOWLEGDE OF HYPERLIPIDEMIA Description: Daughter will be able to demonstrate management of hld using handouts and reading resources for medications and dietary independently. Outcome: Progressing Goal: RH STG INCREASE KNOWLEDGE OF STROKE PROPHYLAXIS Description: Daughter will be able to demonstrate management of stroke using handouts and reading resources for medications , bp monitoring and dietary restrictions independently. Outcome: Progressing

## 2019-02-13 NOTE — Progress Notes (Signed)
[facility] PHYSICAL MEDICINE & REHABILITATION PROGRESS NOTE   Subjective/Complaints:  Pt reports felt sick this AM- couldn't handle it- was mainly back pain causing her to feel sick- denies nausea- was given tylenol and something else- which helped a lot- feels better now.  Also c/o R foot pain to therapy (not to me)- worse with standing/walking- mainly top of R foot- has some swelling, no bruise per PT>   ROS- denies SOB, CP, N/V/D.   Objective:   Dg Chest 2 View  Result Date: 02/11/2019 CLINICAL DATA:  Shortness of breath and chest tightness today. EXAM: CHEST - 2 VIEW COMPARISON:  PA and lateral chest 02/08/2019. Single-view of the chest 02/28/2018. FINDINGS: There is cardiomegaly and mild interstitial edema. Small bilateral pleural effusions are seen. No acute or focal bony abnormality. IMPRESSION: Cardiomegaly and mild interstitial edema with associated small pleural effusions. Electronically Signed   By: Inge Rise M.D.   On: 02/11/2019 16:50   Recent Labs    02/11/19 1646  WBC 9.6  HGB 13.0  HCT 40.3  PLT 337   Recent Labs    02/11/19 1646  CREATININE 0.96    Intake/Output Summary (Last 24 hours) at 02/13/2019 1532 Last data filed at 02/13/2019 1300 Gross per 24 hour  Intake 160 ml  Output -  Net 160 ml     Physical Exam: Vital Signs Blood pressure (!) 146/52, pulse 64, temperature 98.9 F (37.2 C), resp. rate 16, height 5\' 7"  (1.702 m), weight 104.8 kg, SpO2 96 %. Vitals reviewed Constitutional:No distress. obese; sitting up in chair at bedside, alone; brighter affect, NAD HENT:  Head:Normocephalicand atraumatic.  Eyes:EOMare normal.  Neck:Normal range of motion.No tracheal deviationpresent. Cardiovascular:Normal rateand regular rhythm. Murmurheard. Respiratory:Effort normal. Norespiratory distress. She hasno wheezes.  [id number]. She exhibitsno distension. There isno abdominal tenderness.  Musculoskeletal:  General:  Edemapresent.  Neurological: Pt lying in bed. Still appears fatigued. RUE and RLE grossly 4- to 4/5. LUE and LLE 2/5, mild left inattention. Speech dysarthric. Sensed gross touch, decreased pain left arm and leg.  Skin: Skin iswarmand dry. A few scattered ecchymoses esp on arms B/L Psychiatric:  Flat but cooperative, fatigued-  R foot- mild dorsal swelling- no bruising     Assessment/Plan: 1. Functional deficits secondary to Left hemiplegia with dysphagia/dysarthriasecondary to right watershed territory infarction in the setting of severe stenosis right M1 segment status post revascularization which require 3+ hours per day of interdisciplinary therapy in a comprehensive inpatient rehab setting.  Physiatrist is providing close team supervision and 24 hour management of active medical problems listed below.  Physiatrist and rehab team continue to assess barriers to discharge/monitor patient progress toward functional and medical goals  Care Tool:  Bathing  Bathing activity did not occur: Safety/medical concerns           Bathing assist       Upper Body Dressing/Undressing Upper body dressing   What is the patient wearing?: Hospital gown only    Upper body assist Assist Level: Moderate Assistance - Patient 50 - 74%    Lower Body Dressing/Undressing Lower body dressing      What is the patient wearing?: Incontinence brief     Lower body assist Assist for lower body dressing: 2 Helpers     Toileting Toileting    Toileting assist Assist for toileting: 2 Helpers     Transfers Chair/bed transfer  Transfers assist     Chair/bed transfer assist level: 2 Helpers     Locomotion Ambulation  Ambulation assist   Ambulation activity did not occur: Safety/medical concerns          Walk 10 feet activity   Assist  Walk 10 feet activity did not occur: Safety/medical concerns        Walk 50 feet activity   Assist Walk 50 feet with 2 turns  activity did not occur: Safety/medical concerns         Walk 150 feet activity   Assist Walk 150 feet activity did not occur: Safety/medical concerns         Walk 10 feet on uneven surface  activity   Assist Walk 10 feet on uneven surfaces activity did not occur: Safety/medical concerns         Wheelchair     Assist Will patient use wheelchair at discharge?: (TBD but anticipate will need it)   Wheelchair activity did not occur: Safety/medical concerns         Wheelchair 50 feet with 2 turns activity    Assist    Wheelchair 50 feet with 2 turns activity did not occur: Safety/medical concerns       Wheelchair 150 feet activity     Assist  Wheelchair 150 feet activity did not occur: Safety/medical concerns       Blood pressure (!) 146/52, pulse 64, temperature 98.9 F (37.2 C), resp. rate 16, height 5\' 7"  (1.702 m), weight 104.8 kg, SpO2 96 %.  Medical Problem List and Plan: 1.Left hemiplegia with dysphagia/dysarthriasecondary to right watershed territory infarction in the setting of severe stenosis right M1 segment status post revascularization as well as small left lateral posterior temporal lobe infarct in right middle cerebellar peduncle infarct -admit to inpatient rehab -ELOS 14-20+ days, min assist to supervision goals 2. Antithrombotics: -DVT/anticoagulation:Subcutaneous heparin -antiplatelet therapy: Aspirin 81 mg daily, Brilinta 90 mg twice daily 3. Pain Management:Tylenol as needed  -will add voltaren gel for R foot 4. Mood:Seroquel 12.5 mg nightly -antipsychotic agents: N/A 5. Neuropsych: This patientisintermittentlycapable of making decisions onherown behalf. 6. Skin/Wound Care:Routine skin checks 7. Fluids/Electrolytes/Nutrition:Routine in and outs with follow-up chemistries  Labs Monday 8. Dysphagia. Dysphasia #3 nectar liquids currently -pt was  struggling with mastication and D3 textures through her mouth. Will downgrade to D2 and have spoke with SLP who will follow up in AM 9. Hypertension. Maxide 37.5-25 mg daily, clonidine patch 0.1 mg change every 7 days, Zebeta 2.5 mg daily.  -follow for pattern -adjust regimen as needed 10. Hyperlipidemia. Lipitor 11. Hypothyroidism. Synthroid 12. R foot pain- will check xray of R foot and add voltaren gel QID for pain.       LOS: 2 days A FACE TO FACE EVALUATION WAS PERFORMED  Christy Carter 02/13/2019, 3:32 PM

## 2019-02-14 ENCOUNTER — Inpatient Hospital Stay (HOSPITAL_COMMUNITY): Payer: Medicare Other

## 2019-02-14 ENCOUNTER — Inpatient Hospital Stay (HOSPITAL_COMMUNITY): Payer: Medicare Other | Admitting: Occupational Therapy

## 2019-02-14 ENCOUNTER — Inpatient Hospital Stay (HOSPITAL_COMMUNITY): Payer: Medicare Other | Admitting: Speech Pathology

## 2019-02-14 LAB — COMPREHENSIVE METABOLIC PANEL
ALT: 20 U/L (ref 0–44)
AST: 23 U/L (ref 15–41)
Albumin: 3.3 g/dL — ABNORMAL LOW (ref 3.5–5.0)
Alkaline Phosphatase: 69 U/L (ref 38–126)
Anion gap: 15 (ref 5–15)
BUN: 18 mg/dL (ref 8–23)
CO2: 26 mmol/L (ref 22–32)
Calcium: 9.5 mg/dL (ref 8.9–10.3)
Chloride: 98 mmol/L (ref 98–111)
Creatinine, Ser: 1.03 mg/dL — ABNORMAL HIGH (ref 0.44–1.00)
GFR calc Af Amer: 58 mL/min — ABNORMAL LOW (ref >60)
GFR calc non Af Amer: 50 mL/min — ABNORMAL LOW (ref >60)
Glucose, Bld: 105 mg/dL — ABNORMAL HIGH (ref 70–99)
Potassium: 4.2 mmol/L (ref 3.5–5.1)
Sodium: 139 mmol/L (ref 135–145)
Total Bilirubin: 0.9 mg/dL (ref 0.3–1.2)
Total Protein: 6.8 g/dL (ref 6.5–8.1)

## 2019-02-14 LAB — CBC WITH DIFFERENTIAL/PLATELET
Abs Immature Granulocytes: 0.18 10*3/uL — ABNORMAL HIGH (ref 0.00–0.07)
Basophils Absolute: 0.1 10*3/uL (ref 0.0–0.1)
Basophils Relative: 1 %
Eosinophils Absolute: 0.6 10*3/uL — ABNORMAL HIGH (ref 0.0–0.5)
Eosinophils Relative: 5 %
HCT: 44.5 % (ref 36.0–46.0)
Hemoglobin: 14.1 g/dL (ref 12.0–15.0)
Immature Granulocytes: 2 %
Lymphocytes Relative: 14 %
Lymphs Abs: 1.5 10*3/uL (ref 0.7–4.0)
MCH: 29.1 pg (ref 26.0–34.0)
MCHC: 31.7 g/dL (ref 30.0–36.0)
MCV: 91.8 fL (ref 80.0–100.0)
Monocytes Absolute: 1 10*3/uL (ref 0.1–1.0)
Monocytes Relative: 9 %
Neutro Abs: 7.3 10*3/uL (ref 1.7–7.7)
Neutrophils Relative %: 69 %
Platelets: 398 10*3/uL (ref 150–400)
RBC: 4.85 MIL/uL (ref 3.87–5.11)
RDW: 15.7 % — ABNORMAL HIGH (ref 11.5–15.5)
WBC: 10.7 10*3/uL — ABNORMAL HIGH (ref 4.0–10.5)
nRBC: 0 % (ref 0.0–0.2)

## 2019-02-14 NOTE — Progress Notes (Signed)
[facility] PHYSICAL MEDICINE & REHABILITATION PROGRESS NOTE   Subjective/Complaints:   I was at [facility] office for my xray Reoriented pt   ROS- denies SOB, CP, N/V/D.   Objective:   Dg Foot 2 Views Right  Result Date: 02/13/2019 CLINICAL DATA:  85 year old female with history of right foot pain aggravated by exercise. EXAM: RIGHT FOOT - 2 VIEW COMPARISON:  12/24/2008. FINDINGS: Two views of the right foot demonstrate no acute displaced fracture, subluxation or dislocation. Mild multifocal joint space narrowing, subchondral sclerosis and osteophyte formation indicative of osteoarthritis, most evident in the midfoot. Small plantar calcaneal spur incidentally noted. IMPRESSION: 1. No acute radiographic abnormality of the right foot. 2. Mild multifocal osteoarthritis, most severe in the midfoot. Electronically Signed   By: Vinnie Langton M.D.   On: 02/13/2019 19:18   Recent Labs    02/11/19 1646 02/14/19 0748  WBC 9.6 10.7*  HGB 13.0 14.1  HCT 40.3 44.5  PLT 337 398   Recent Labs    02/11/19 1646  CREATININE 0.96    Intake/Output Summary (Last 24 hours) at 02/14/2019 0829 Last data filed at 02/13/2019 1823 Gross per 24 hour  Intake 160 ml  Output -  Net 160 ml     Physical Exam: Vital Signs Blood pressure (!) 139/43, pulse 72, temperature 98.4 F (36.9 C), temperature source Oral, resp. rate 20, height 5\' 7"  (1.702 m), weight 104.8 kg, SpO2 96 %. Vitals reviewed Constitutional:No distress. obese; sitting up in chair at bedside, alone; brighter affect, NAD HENT:  Head:Normocephalicand atraumatic.  Eyes:EOMare normal.  Neck:Normal range of motion.No tracheal deviationpresent. Cardiovascular:Normal rateand regular rhythm. Murmurheard. Respiratory:Effort normal. Norespiratory distress. She hasno wheezes.  [id number]. She exhibitsno distension. There isno abdominal tenderness.  Musculoskeletal:  General: Edemapresent.  Neurological: Pt  lying in bed. Still appears fatigued. RUE and RLE grossly 4- to 4/5. LUE and LLE 2/5, mild left inattention. Speech dysarthric. Sensed gross touch, decreased pain left arm and leg.  Skin: Skin iswarmand dry. A few scattered ecchymoses esp on arms B/L Psychiatric:  Flat but cooperative, fatigued-  R foot- mild dorsal swelling- no bruising- no pain with foot and ankle rom  Oriented to person and hosp but not time  Or name of hospital     Assessment/Plan: 1. Functional deficits secondary to Left hemiplegia with dysphagia/dysarthriasecondary to right watershed territory infarction in the setting of severe stenosis right M1 segment status post revascularization which require 3+ hours per day of interdisciplinary therapy in a comprehensive inpatient rehab setting.  Physiatrist is providing close team supervision and 24 hour management of active medical problems listed below.  Physiatrist and rehab team continue to assess barriers to discharge/monitor patient progress toward functional and medical goals  Care Tool:  Bathing  Bathing activity did not occur: Safety/medical concerns           Bathing assist Assist Level: Maximal Assistance - Patient 24 - 49%     Upper Body Dressing/Undressing Upper body dressing   What is the patient wearing?: Hospital gown only    Upper body assist Assist Level: Moderate Assistance - Patient 50 - 74%    Lower Body Dressing/Undressing Lower body dressing      What is the patient wearing?: Incontinence brief     Lower body assist Assist for lower body dressing: 2 Helpers     Toileting Toileting    Toileting assist Assist for toileting: 2 Helpers     Transfers Chair/bed transfer  Transfers assist     Chair/bed  transfer assist level: 2 Helpers     Locomotion Ambulation   Ambulation assist   Ambulation activity did not occur: Safety/medical concerns          Walk 10 feet activity   Assist  Walk 10 feet activity  did not occur: Safety/medical concerns        Walk 50 feet activity   Assist Walk 50 feet with 2 turns activity did not occur: Safety/medical concerns         Walk 150 feet activity   Assist Walk 150 feet activity did not occur: Safety/medical concerns         Walk 10 feet on uneven surface  activity   Assist Walk 10 feet on uneven surfaces activity did not occur: Safety/medical concerns         Wheelchair     Assist Will patient use wheelchair at discharge?: (TBD but anticipate will need it)   Wheelchair activity did not occur: Safety/medical concerns         Wheelchair 50 feet with 2 turns activity    Assist    Wheelchair 50 feet with 2 turns activity did not occur: Safety/medical concerns       Wheelchair 150 feet activity     Assist  Wheelchair 150 feet activity did not occur: Safety/medical concerns       Blood pressure (!) 139/43, pulse 72, temperature 98.4 F (36.9 C), temperature source Oral, resp. rate 20, height 5\' 7"  (1.702 m), weight 104.8 kg, SpO2 96 %.  Medical Problem List and Plan: 1.Left hemiplegia with dysphagia/dysarthriasecondary to right watershed territory infarction in the setting of severe stenosis right M1 segment status post revascularization as well as small left lateral posterior temporal lobe infarct in right middle cerebellar peduncle infarct  Confusion post CVA- multi infarcts ask Neuropsych to eval  CIR PT, OT -ELOS 14-20+ days, min assist to supervision goals 2. Antithrombotics: -DVT/anticoagulation:Subcutaneous heparin -antiplatelet therapy: Aspirin 81 mg daily, Brilinta 90 mg twice daily 3. Pain Management:Tylenol as needed  -will add voltaren gel for R foot 4. Mood:Seroquel 12.5 mg nightly -antipsychotic agents: N/A 5. Neuropsych: This patientisintermittentlycapable of making decisions onherown behalf. 6. Skin/Wound Care:Routine skin  checks 7. Fluids/Electrolytes/Nutrition:Routine in and outs with follow-up chemistries  Labs Monday 8. Dysphagia. Dysphasia #3 nectar liquids currently -pt was struggling with mastication and D3 textures through her mouth. Will downgrade to D2 and have spoke with SLP who will follow up in AM 9. Hypertension. Maxide 37.5-25 mg daily, clonidine patch 0.1 mg change every 7 days, Zebeta 2.5 mg daily.   Vitals:   02/13/19 2008 02/14/19 0413  BP: 131/74 (!) 139/43  Pulse: 72 72  Resp: 16 20  Temp: 97.9 F (36.6 C) 98.4 F (36.9 C)  SpO2: 99% 96%   10. Hyperlipidemia. Lipitor 11. Hypothyroidism. Synthroid 12. R foot pain- Mid foot OA     LOS: 3 days A FACE TO FACE EVALUATION WAS PERFORMED  Charlett Blake 02/14/2019, 8:29 AM

## 2019-02-14 NOTE — Progress Notes (Signed)
Occupational Therapy Session Note  Patient Details  Name: Christy Carter MRN: [id number] Date of Birth: 03/06/1934  Today's Date: 02/14/2019 OT Individual Time: [id number] and [id number] OT Individual Time Calculation (min): 26 min and 68 mins   Short Term Goals: Week 1:  OT Short Term Goal 1 (Week 1): Pt will don shirt with mod A OT Short Term Goal 2 (Week 1): Pt will maintain static EOB sitting balance with min A while completing ADL task OT Short Term Goal 3 (Week 1): Pt will complete LB bathing bed level with LRAD with mod A OT Short Term Goal 4 (Week 1): Pt will require only mod cueing for attending to L side of body during UB bathing   Skilled Therapeutic Interventions/Progress Updates:    Session 1: Upon entering the room, pt supine in bed with HOB elevated and RN present to provide supervision with meal. OT taking over supervision and encouraging pt to drink liquids. Cup placed into B hands and put able to get cup to mouth with increased time. Pt pocketing food in L cheek and unaware. OT utilized suction to remove food for safety. Pt remained in bed at end of session and repositioned for comfort. Bed alarm activated and call bell within reach.   Session 2: Upon entering the room, pt supine in bed with daughter present. Pt reports needing to use bathroom. Supine >sit with max A to EOB. Stedy utilized with +2 for safety and transferred pt into bathroom for transfer onto toilet. Pt needing max A to stand from elevated surface of stedy with total A for hygiene and clothing management. Pt requesting to change clothing. LB clothing removed while seated on toilet. Focus on hemiplegic technique for UB dressing to don gown. Pt needing max cuing and max A as she is externally distracted by environment. OT assisted pt into wheelchair with stedy and pt taken to location with minimal distractions. Pt with tangential speech throughout and need max cuing for redirection. Pt engaged in table top task but of  matching cards at midline and then colors but again with max cuing and occasional hand over hand with L or R hand. Pt's attention appearing to be at 60 seconds. OT attempting to have pt put together connecter piece with pvc pipe from pipe tree activity but able to complete a single one after 10 minutes of attempts, hand over hand assist, and multimodal cuing. Pt returning to room via wheelchair and agreeable to stay in wheelchair with chair alarm belt donned and call bell within reach.   Therapy Documentation Precautions:  Precautions Precautions: Fall, Other (comment) Precaution Comments: L inattention Restrictions Weight Bearing Restrictions: No   Therapy/Group: Individual Therapy  Gypsy Decant 02/14/2019, 9:45 AM

## 2019-02-14 NOTE — Progress Notes (Signed)
Physical Therapy Session Note  Patient Details  Name: Christy Carter MRN: [id number] Date of Birth: 1933-07-25  Today's Date: 02/14/2019 PT Individual Time: 1120-1203 PT Individual Time Calculation (min): 43 min   Short Term Goals: Week 1:  PT Short Term Goal 1 (Week 1): Pt will perform supine<>sit with mod assist PT Short Term Goal 2 (Week 1): Pt will perform sit<>stand with max assist of 1 PT Short Term Goal 3 (Week 1): Pt will perform bed<>chair transfers with max assist of 1, not using mechanical lift. PT Short Term Goal 4 (Week 1): Pt will initiate gait  Skilled Therapeutic Interventions/Progress Updates:    Pt supine in bed upon PT arrival, agreeable to therapy tx and denies pain. Pt performed rolling in each direction with max assist +2 in order to don pants, cues for hand placement and techniques. Pt transferred to sitting EOB with max assist, pt performed sit<>stand from elevated EOB with stedy and max +2 assist. In standing cues for full extension and midline as pt leans L, manual facilitation for increased hip extension. Pt transferred in front of sink with stedy, while sitting on elevated stedy seat used the mirror in the room to work on midline orientation and limit L lean, cues to touch pt's R shoulder to therapist's shoulder. While maintaining midline pt performed x 2 sit<>stands from elevated stedy seat with max assist +2, cues for midline and mirror for visual feedback. Sitting in the stedy pt brushed hair, max cues for midline. Pt transferred to w/c dependently with stedy. Pt attempted to perform sit<>stand at rail this session, unable to come up to standing despite max assist. Pt with confusion throughout session and disorientation, therapist re-orienting pt throughout session. Pt left in w/c with needs in reach and chair alarm set.   Therapy Documentation Precautions:  Precautions Precautions: Fall, Other (comment) Precaution Comments: L inattention Restrictions Weight  Bearing Restrictions: No    Therapy/Group: Individual Therapy  Netta Corrigan, PT, DPT, CSRS 02/14/2019, 7:57 AM

## 2019-02-14 NOTE — Care Management Note (Signed)
[facility] Individual Statement of Services  Patient Name:  Christy Carter  Date:  02/14/2019  Welcome to the [facility].  Our goal is to provide you with an individualized program based on your diagnosis and situation, designed to meet your specific needs.  With this comprehensive rehabilitation program, you will be expected to participate in at least 3 hours of rehabilitation therapies Monday-Friday, with modified therapy programming on the weekends.  Your rehabilitation program will include the following services:  Physical Therapy (PT), Occupational Therapy (OT), Speech Therapy (ST), 24 hour per day rehabilitation nursing, Neuropsychology, Case Management (Social Worker), Rehabilitation Medicine, Nutrition Services and Pharmacy Services  Weekly team conferences will be held on Wednesday to discuss your progress.  Your Social Worker will talk with you frequently to get your input and to update you on team discussions.  Team conferences with you and your family in attendance may also be held.  Expected length of stay: 3-4 weeks Overall anticipated outcome: min assist level  Depending on your progress and recovery, your program may change. Your Social Worker will coordinate services and will keep you informed of any changes. Your Social Worker's name and contact numbers are listed  below.  The following services may also be recommended but are not provided by the [facility]:    [facility] will be made to provide these services after discharge if needed.  Arrangements include referral to agencies that provide these services.  Your insurance has been verified to be:  UHC-medicare Your primary doctor is:  Clayborn Bigness  Pertinent information will be shared with your doctor and your insurance company.  Social Worker:  Ovidio Kin, [place] or [phone]  Information discussed with and copy given to patient by: Elease Hashimoto, 02/14/2019, 10:59 AM

## 2019-02-14 NOTE — Progress Notes (Signed)
Social Work Assessment and Plan   Patient Details  Name: Christy Carter MRN: [id number] Date of Birth: 01/05/1934  Today's Date: 02/14/2019  Problem List:  Patient Active Problem List   Diagnosis Date Noted  . Acute bilat watershed infarction [facility]) 02/11/2019  . Agitation requiring sedation protocol 02/09/2019  . Dysphagia 02/09/2019  . Stroke (cerebrum) ([place]) - R watershed d/t large vessel dz & small L temp lobe & R cerebellar infarcts 02/05/2019  . Middle cerebral artery stenosis, right 02/05/2019  . Acute respiratory failure ([place]) 02/03/2019  . Acute respiratory failure with hypoxemia ([place]) 02/03/2019  . Abnormal renal function 01/19/2019  . Pain in left foot 12/19/2018  . Hematuria 08/02/2018  . Influenza A 03/02/2018  . Urinary tract infection without hematuria 03/02/2018  . Sepsis ([place]) 02/11/2018  . CAP (community acquired pneumonia) 02/11/2018  . CKD (chronic kidney disease), stage III 02/11/2018  . Memory loss, short term 02/10/2018  . Flu vaccine need 02/10/2018  . Chest pain 02/03/2018  . Acute upper respiratory infection 02/03/2018  . Gastroesophageal reflux disease without esophagitis 02/03/2018  . Encounter for general adult medical examination with abnormal findings 01/13/2018  . Chronic low back pain 01/13/2018  . Need for vaccination against Streptococcus pneumoniae using pneumococcal conjugate vaccine 7 01/13/2018  . Dysuria 01/13/2018  . 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 Pelvic pain 10/08/2017  . Gout 06/15/2017  . Mixed hyperlipidemia 06/15/2017  . Inflammatory polyarthropathy ([place]) 06/15/2017  . Neuralgia and neuritis, unspecified 06/15/2017  . Major depressive disorder, recurrent, mild ([place]) 06/15/2017  . Other vitamin B12 deficiency anemias 06/15/2017  . Hypothyroidism 10/17/2008  . OVERWEIGHT/OBESITY 10/17/2008  . SLEEP APNEA, OBSTRUCTIVE 10/17/2008  . Essential hypertension 10/17/2008  . GALLSTONES 10/17/2008  . DYSPNEA  10/17/2008  . Obesity 10/17/2008   Past Medical History:  Past Medical History:  Diagnosis Date  . Arthritis   . Carpal tunnel syndrome   . Complication of anesthesia    Difficulty waking up. Believes it took 2 days to wake up after her surgery.  . Depression   . Hyperlipidemia   . Hypertension   . Shortness of breath   . Stroke ([place])   . Thyroid disease    Past Surgical History:  Past Surgical History:  Procedure Laterality Date  . APPENDECTOMY    . BACK SURGERY    . CHOLECYSTECTOMY    . HERNIA REPAIR    . IR CT HEAD LTD  02/05/2019  . IR PTA INTRACRANIAL  02/05/2019  . RADIOLOGY WITH ANESTHESIA N/A 02/05/2019   Procedure: CODE STROKE;  Surgeon: Radiologist, Medication, MD;  Location: [facility];  Service: Radiology;  Laterality: N/A;   Social History:  reports that she quit smoking about 26 years ago. Her smoking use included cigarettes. She has never used smokeless tobacco. She reports that she does not drink alcohol or use drugs.  Family / Support Systems Marital Status: Widow/Widower Patient Roles: Parent Children: [name]-daughter [id number]  Landry Mellow [id number] Other Supports: Grandchildren Anticipated Caregiver: Son and daughter Ability/Limitations of Caregiver: Both are retired and can assist, aware she will need 24 hr care at DC Caregiver Availability: 24/7 Family Dynamics: Close with children and grandchildren who are involved and supportive. She has good supports and has some neighbors and church members who are supportive and will chekc on her.  Social History Preferred language: English Religion: None Cultural Background: No issues Education: HS Read: Yes Write: Yes Employment Status: Retired Public relations account executive Issues: No issues Guardian/Conservator: none-according to  MD pt is iontermitently capable of making her own decisions while here   Abuse/Neglect Abuse/Neglect Assessment Can Be Completed: Yes Physical Abuse: Denies Verbal Abuse:  Denies Sexual Abuse: Denies Exploitation of patient/patient's resources: Denies Self-Neglect: Denies  Emotional Status Pt's affect, behavior and adjustment status: Pt doesn;t want to be a burden to her children and she is tearful when discussing this. She has always been able to be independent and wants to get back to this level. She has good movement but will need to see if can coordinate it. Recent Psychosocial Issues: Ot her health issues was mod/i until this stroke Psychiatric History: history of depression she reprots due to son's death. She was not taking any meds prior to admission. She would benefit from seeing neuro-psych while here due to tearful and would benefit her. Substance Abuse History: No issues  Patient / Family Perceptions, Expectations & Goals Pt/Family understanding of illness & functional limitations: Pt and daughter can explain her stroke and deficits. Daughter is the visitor and is involved. Both seem ot have a basic understanding of her deficits and would like to talk with a MD while here. Premorbid pt/family roles/activities: mom, gradnmother, retiree, church member, etc Anticipated changes in roles/activities/participation: resume Pt/family expectations/goals: Pt states: " I want to do for myself I always have, don't want to burden my children."  Daughter states: " We will do whatever she needs, she doesn't have to worry about it."  [employer]: None Premorbid Home Care/DME Agencies: Other (Comment)(rw) Transportation available at discharge: Children Resource referrals recommended: Neuropsychology  Discharge Planning Living Arrangements: Alone Support Systems: Children, Other relatives, Friends/neighbors, Social worker community Type of Residence: Private residence Insurance Resources: Multimedia programmer (specify)(UHC-Medicare) Financial Resources: Social Security Financial Screen Referred: No Living Expenses: Own Money Management:  Patient Does the patient have any problems obtaining your medications?: No Home Management: Self Patient/Family Preliminary Plans: Either return to her home or go to one of her children's homes. According to daughter between she and her brother she will have 24 hr care. Pt will need this at DC informed daughter. Will work on discharge needs and await conference on [place] Work Anticipated Follow Up Needs: [facility] yet tearful patient who is wanting to regain her independence and get back home. She does not want to burden her children and never has. Her two children plan to assist her at discharge. Would benefit from seeing neuro-psych while here.  Elease Hashimoto 02/14/2019, 10:58 AM

## 2019-02-14 NOTE — Progress Notes (Signed)
Inpatient Rehabilitation  Patient information reviewed and entered into eRehab system by Jaedan Huttner M. Guillermo Difrancesco, M.A., CCC/SLP, PPS Coordinator.  Information including medical coding, functional ability and quality indicators will be reviewed and updated through discharge.    

## 2019-02-14 NOTE — Progress Notes (Signed)
Speech Language Pathology Daily Session Note  Patient Details  Name: Christy Carter MRN: [id number] Date of Birth: Apr 01, 1934  Today's Date: 02/14/2019 SLP Individual Time: 0930-1015 SLP Individual Time Calculation (min): 45 min  Short Term Goals: Week 1: SLP Short Term Goal 1 (Week 1): Pt will consume dys 2 textures and nectar thick liquids with supervision cues for use of swallowing precautions and minimal overt s/s of aspiration. SLP Short Term Goal 2 (Week 1): Pt will consume therapeutic trials of thin liquids with supervision cues for use of swallowing precautions and minimal overt s/s of aspiration over 2 consecutive sessions prior to repeat instrumental assessment. SLP Short Term Goal 3 (Week 1): Pt will locate items to the left of midline in >25% of opportunities during basic, familiar tasks with mod assist multimodal cues. SLP Short Term Goal 4 (Week 1): Pt will complete basic, familiar tasks wtih mod assist multimodal cues for functional problem solving. SLP Short Term Goal 5 (Week 1): Pt will utilize external aids to recall basic, daily information with mod assist verbal cues.  Skilled Therapeutic Interventions: Skilled treatment session focused on cognitive goals. Upon arrival, patient reported lethargy and required Min verbal cues to maintain alertness throughout session. Patient with consistent language of confusion and hallucinations (seeing bugs on ceiling), RN aware despite constant redirection. SLP utilized external aids for recall of date in which she required Max verbal and visual cues to scan to midline. Patient falling asleep towards end of session, therefore, patient missed remaining 15 mins of session. Patient left supine in bed with alarm on and all needs within reach. Continue with current plan of care.      Pain No/Denies Pain   Therapy/Group: Individual Therapy  Jobeth Pangilinan 02/14/2019, 12:29 PM

## 2019-02-15 ENCOUNTER — Inpatient Hospital Stay (HOSPITAL_COMMUNITY): Payer: Medicare Other

## 2019-02-15 ENCOUNTER — Inpatient Hospital Stay (HOSPITAL_COMMUNITY): Payer: Medicare Other | Admitting: Occupational Therapy

## 2019-02-15 NOTE — Plan of Care (Signed)
  Problem: Consults Goal: RH STROKE PATIENT EDUCATION Description: See Patient Education module for education specifics  Outcome: Progressing   Problem: RH BOWEL ELIMINATION Goal: RH STG MANAGE BOWEL WITH ASSISTANCE Description: STG Manage Bowel with min Assistance. Outcome: Progressing   Problem: RH BLADDER ELIMINATION Goal: RH STG MANAGE BLADDER WITH ASSISTANCE Description: STG Manage Bladder With min  Assistance Outcome: Progressing   Problem: RH SAFETY Goal: RH STG ADHERE TO SAFETY PRECAUTIONS W/ASSISTANCE/DEVICE Description: STG Adhere to Safety Precautions With Cues and reminders. Outcome: Progressing   Problem: RH KNOWLEDGE DEFICIT Goal: RH STG INCREASE KNOWLEDGE OF HYPERTENSION Description: Daughter will be able to demonstrate management of htn using handouts and reading resources for medications , bp monitoring and dietary independently. Outcome: Progressing Goal: RH STG INCREASE KNOWLEDGE OF DYSPHAGIA/FLUID INTAKE Description: Daughter will be able to demonstrate management of dysphagia using handouts and reading resources for medications dietary restrictions independently. Outcome: Progressing Goal: RH STG INCREASE KNOWLEGDE OF HYPERLIPIDEMIA Description: Daughter will be able to demonstrate management of hld using handouts and reading resources for medications and dietary independently. Outcome: Progressing Goal: RH STG INCREASE KNOWLEDGE OF STROKE PROPHYLAXIS Description: Daughter will be able to demonstrate management of stroke using handouts and reading resources for medications , bp monitoring and dietary restrictions independently. Outcome: Progressing

## 2019-02-15 NOTE — Progress Notes (Signed)
Occupational Therapy Session Note  Patient Details  Name: Christy Carter MRN: [id number] Date of Birth: Jul 08, 1933  Today's Date: 02/15/2019 OT Individual Time: 1050-1200 OT Individual Time Calculation (min): 70 min    Short Term Goals: Week 1:  OT Short Term Goal 1 (Week 1): Pt will don shirt with mod A OT Short Term Goal 2 (Week 1): Pt will maintain static EOB sitting balance with min A while completing ADL task OT Short Term Goal 3 (Week 1): Pt will complete LB bathing bed level with LRAD with mod A OT Short Term Goal 4 (Week 1): Pt will require only mod cueing for attending to L side of body during UB bathing  Skilled Therapeutic Interventions/Progress Updates:    Pt received in w/c ready for therapy.   ADL Retraining: Pt eager to take a shower but explained to her that she needs to get stronger with her balance to have that be a safe activity.  Pt understood that we would need to continue with a sink bath today.  Set up with UB bathing, min -mod to don pull over shirt,  Full A with LB clothing but pt was able to rise to stand at sink (versus needing the stedy) 3x and held stand position.   Balance: mod - max A of 2 to hold static stand balance with pt resting her hands on the sink  Neuromuscular Re-Education: excellent use of LUE as an active assist.  Pt used L arm actively to wash under R arm, apply deoderant, pull her shirt on and push up to stand from her wc. She was also able to use as an active assist with combing and pulling her hair back.    Her daughter arrived at the end of her session and updated her dtr on her progress. She mentioned that her mother has not been able to stand fully upright for years and walks hunched over due to prior back surgery.   Excellent participation today.  Pt in wc with belt alarm on and all needs met.    Therapy Documentation Precautions:  Precautions Precautions: Fall, Other (comment) Precaution Comments: L inattention Restrictions Weight  Bearing Restrictions: No  Pain: Pain Assessment Pain Scale: Faces Pain Score: 0-No pain Faces Pain Scale: No hurt   Therapy/Group: Individual Therapy  [place] 02/15/2019, 12:32 PM

## 2019-02-15 NOTE — Progress Notes (Signed)
Occupational Therapy Session Note  Patient Details  Name: Christy Carter MRN: [id number] Date of Birth: [birth year]  Today's Date: 02/15/2019 OT Individual Time: [id number] OT Individual Time Calculation (min): 30 min    Short Term Goals: Week 1:  OT Short Term Goal 1 (Week 1): Pt will don shirt with mod A OT Short Term Goal 2 (Week 1): Pt will maintain static EOB sitting balance with min A while completing ADL task OT Short Term Goal 3 (Week 1): Pt will complete LB bathing bed level with LRAD with mod A OT Short Term Goal 4 (Week 1): Pt will require only mod cueing for attending to L side of body during UB bathing  Skilled Therapeutic Interventions/Progress Updates:    Pt completed self feeding from wheelchair during session with her daughter present.  She was able to self feed with supervision and min instructional cueing for swallowing precautions using the RUE.  Mod assist was needed for self feeding with the non-dominant LUE, using built up foam grip over the utensil.  She was able to hold onto a cup with min assist as well and guard with the RUE while bringing to her mouth.  Min instructional cueing also needed for pt to locate some items left of tray as well as her drink at midline.  Finished session with NT in for rest of session to finish self feeding.    Therapy Documentation Precautions:  Precautions Precautions: Fall, Other (comment) Precaution Comments: L inattention, left hemiparesis Restrictions Weight Bearing Restrictions: No  Pain: Pain Assessment Pain Scale: Faces Pain Score: 0-No pain ADL: See Care Tool Section for some details of ADL tasks  Therapy/Group: Individual Therapy  Yesmin Mutch OTR/L 02/15/2019, 3:51 PM

## 2019-02-15 NOTE — Progress Notes (Signed)
Physical Therapy Session Note  Patient Details  Name: Christy Carter MRN: [id number] Date of Birth: July 01, 1933  Today's Date: 02/15/2019 PT Individual Time: 0802-0859 PT Individual Time Calculation (min): 57 min   Short Term Goals: Week 1:  PT Short Term Goal 1 (Week 1): Pt will perform supine<>sit with mod assist PT Short Term Goal 2 (Week 1): Pt will perform sit<>stand with max assist of 1 PT Short Term Goal 3 (Week 1): Pt will perform bed<>chair transfers with max assist of 1, not using mechanical lift. PT Short Term Goal 4 (Week 1): Pt will initiate gait  Skilled Therapeutic Interventions/Progress Updates:    Pt supine in bed upon PT arrival, agreeable to therapy tx and denies pain. Pt performed rolling in each direction with max assist +2 in order to don clean brief, cues for hand placement and techniques. Pt transferred to sitting EOB with max assist, cues for sequencing and hand placement. Pt worked on sitting balance this session and midline orientation while seated EOB performing reaching task outside BOS with CGA-supervision. Pt continued to work on seated balance this session and L attention while seated EOB working on eating breakfast x 10 min, cues for attention to L side and use of L UE when grasping coffee cup with both hands, cues and assist to clear L pocketing. Pt performed sit<>stand from elevated bed this session within stedy with max assist +2, facilitation for hip extension and cues for upright posture. Pt transferred in front of sink in stedy, pt with improved midline orientation this session compared to yesterday. Pt worked on maintaining midline while sitting on elevated stedy seat and brushing teeth, brushing hair this session. Pt performed x 3 sit<>stands from stedy seat this session with mod +2 assist, cues for techniques and midline. Pt worked on R lateral weightshift and lean while performing reaching task sitting on stedy seat, x 2 trials CGA for balance. Pt transferred  to w/c dependently with stedy this session. Pt worked on w/c propulsion using B UEs this session, x 30 ft with mod assist for steering and propulsion, max cues for attention to L UE and sequencing. Pt left in w/c at end of session with needs in reach and chair alarm set.   Therapy Documentation Precautions:  Precautions Precautions: Fall, Other (comment) Precaution Comments: L inattention Restrictions Weight Bearing Restrictions: No   Therapy/Group: Individual Therapy   Netta Corrigan, PT, DPT, CSRS 02/15/19  7:40 AM

## 2019-02-15 NOTE — Progress Notes (Signed)
[facility] PHYSICAL MEDICINE & REHABILITATION PROGRESS NOTE   Subjective/Complaints:   Had nightmare and disorientation last noc but this am realized that she was confused. Currently oriented to hospital and recognized me as Dr.   Discussed with RN, no redness noted on sacrum this am   ROS- denies SOB, CP, N/V/D.   Objective:   Dg Foot 2 Views Right  Result Date: 02/13/2019 CLINICAL DATA:  85 year old female with history of right foot pain aggravated by exercise. EXAM: RIGHT FOOT - 2 VIEW COMPARISON:  12/24/2008. FINDINGS: Two views of the right foot demonstrate no acute displaced fracture, subluxation or dislocation. Mild multifocal joint space narrowing, subchondral sclerosis and osteophyte formation indicative of osteoarthritis, most evident in the midfoot. Small plantar calcaneal spur incidentally noted. IMPRESSION: 1. No acute radiographic abnormality of the right foot. 2. Mild multifocal osteoarthritis, most severe in the midfoot. Electronically Signed   By: Vinnie Langton M.D.   On: 02/13/2019 19:18   Recent Labs    02/14/19 0748  WBC 10.7*  HGB 14.1  HCT 44.5  PLT 398   Recent Labs    02/14/19 0748  NA 139  K 4.2  CL 98  CO2 26  GLUCOSE 105*  BUN 18  CREATININE 1.03*  CALCIUM 9.5    Intake/Output Summary (Last 24 hours) at 02/15/2019 0844 Last data filed at 02/14/2019 1800 Gross per 24 hour  Intake 120 ml  Output -  Net 120 ml     Physical Exam: Vital Signs Blood pressure (!) 156/55, pulse 63, temperature 98.6 F (37 C), resp. rate 18, height 5\' 7"  (1.702 m), weight 104.8 kg, SpO2 96 %. Vitals reviewed Constitutional:No distress. obese; sitting up in chair at bedside, alone; brighter affect, NAD HENT:  Head:Normocephalicand atraumatic.  Eyes:EOMare normal.  Neck:Normal range of motion.No tracheal deviationpresent. Cardiovascular:Normal rateand regular rhythm. Murmurheard. Respiratory:Effort normal. Norespiratory distress. She  hasno wheezes.  [id number]. She exhibitsno distension. There isno abdominal tenderness.  Musculoskeletal:  General: Edemapresent.  Neurological: Pt in WC . Still appears fatigued. RUE and RLE grossly 4- to 4/5. LUE and LLE 2/5, mild left inattention. Speech dysarthric. Sensed gross touch, decreased pain left arm and leg.  Skin: Skin iswarmand dry. A few scattered ecchymoses esp on arms B/L Psychiatric:  Flat but cooperative, fatigued-  R foot- mild dorsal swelling- no bruising- no pain with foot and ankle rom  Oriented to person and hosp but not time  Or name of hospital     Assessment/Plan: 1. Functional deficits secondary to Left hemiplegia with dysphagia/dysarthriasecondary to right watershed territory infarction in the setting of severe stenosis right M1 segment status post revascularization which require 3+ hours per day of interdisciplinary therapy in a comprehensive inpatient rehab setting.  Physiatrist is providing close team supervision and 24 hour management of active medical problems listed below.  Physiatrist and rehab team continue to assess barriers to discharge/monitor patient progress toward functional and medical goals  Care Tool:  Bathing  Bathing activity did not occur: Safety/medical concerns           Bathing assist Assist Level: Maximal Assistance - Patient 24 - 49%     Upper Body Dressing/Undressing Upper body dressing   What is the patient wearing?: Hospital gown only    Upper body assist Assist Level: Moderate Assistance - Patient 50 - 74%    Lower Body Dressing/Undressing Lower body dressing      What is the patient wearing?: Incontinence brief     Lower body assist Assist  for lower body dressing: 2 Banker assist Assist for toileting: 2 Helpers     Transfers Chair/bed transfer  Transfers assist     Chair/bed transfer assist level: 2 Helpers      Locomotion Ambulation   Ambulation assist   Ambulation activity did not occur: Safety/medical concerns          Walk 10 feet activity   Assist  Walk 10 feet activity did not occur: Safety/medical concerns        Walk 50 feet activity   Assist Walk 50 feet with 2 turns activity did not occur: Safety/medical concerns         Walk 150 feet activity   Assist Walk 150 feet activity did not occur: Safety/medical concerns         Walk 10 feet on uneven surface  activity   Assist Walk 10 feet on uneven surfaces activity did not occur: Safety/medical concerns         Wheelchair     Assist Will patient use wheelchair at discharge?: (TBD but anticipate will need it)   Wheelchair activity did not occur: Safety/medical concerns         Wheelchair 50 feet with 2 turns activity    Assist    Wheelchair 50 feet with 2 turns activity did not occur: Safety/medical concerns       Wheelchair 150 feet activity     Assist  Wheelchair 150 feet activity did not occur: Safety/medical concerns       Blood pressure (!) 156/55, pulse 63, temperature 98.6 F (37 C), resp. rate 18, height 5\' 7"  (1.702 m), weight 104.8 kg, SpO2 96 %.  Medical Problem List and Plan: 1.Left hemiplegia with dysphagia/dysarthriasecondary to right watershed territory infarction in the setting of severe stenosis right M1 segment status post revascularization as well as small left lateral posterior temporal lobe infarct in right middle cerebellar peduncle infarct  Confusion post CVA- multi infarcts ask Neuropsych to eval  CIR PT, OT -ELOS 14-20+ days, min assist to supervision goals 2. Antithrombotics: -DVT/anticoagulation:Subcutaneous heparin -antiplatelet therapy: Aspirin 81 mg daily, Brilinta 90 mg twice daily 3. Pain Management:Tylenol as needed  -will add voltaren gel for R foot 4. Mood:Seroquel 12.5 mg  nightly -antipsychotic agents: N/A 5. Neuropsych: This patientisintermittentlycapable of making decisions onherown behalf. 6. Skin/Wound Care:Routine skin checks 7. Fluids/Electrolytes/Nutrition:Routine in and outs with follow-up chemistries  Labs Monday 8. Dysphagia. Dysphasia #3 nectar liquids currently -pt was struggling with mastication and D3 textures through her mouth. Will downgrade to D2 and have spoke with SLP who will follow up in AM 9. Hypertension. Maxide 37.5-25 mg daily, clonidine patch 0.1 mg change every 7 days, Zebeta 2.5 mg daily.   Vitals:   02/14/19 2005 02/15/19 0305  BP: (!) 143/49 (!) 156/55  Pulse: 70 63  Resp: 18 18  Temp: 98.2 F (36.8 C) 98.6 F (37 C)  SpO2:  96%   10. Hyperlipidemia. Lipitor 11. Hypothyroidism. Synthroid 12. R foot pain- Mid foot OA     LOS: 4 days A FACE TO FACE EVALUATION WAS PERFORMED  Charlett Blake 02/15/2019, 8:44 AM

## 2019-02-15 NOTE — IPOC Note (Signed)
Overall Plan of Care [facility]) Patient Details Name: Christy Carter MRN: [id number] DOB: 11-Sep-1933  Admitting Diagnosis: Acute bilat watershed infarction [facility])  Hospital Problems: Principal Problem:   Acute bilat watershed infarction [facility])     Functional Problem List: Nursing Bladder, Bowel, Edema, Endurance, Medication Management, Motor, Nutrition, Safety  PT Balance, Perception, Safety, Behavior, Edema, Sensory, Endurance, Motor, Pain, Skin Integrity  OT Balance, Perception, Safety, Cognition, Skin Integrity, Edema, Endurance, Vision, Motor, Pain  SLP Cognition, Nutrition  TR         Basic ADL's: OT Grooming, Bathing, Dressing, Toileting     Advanced  ADL's: OT       Transfers: PT Bed Mobility, Bed to Chair, Car, Chief Operating Officer: PT Ambulation, Emergency planning/management officer, Stairs     Additional Impairments: OT None  SLP Swallowing, Social Cognition   Problem Solving, Memory, Awareness  TR      Anticipated Outcomes Item Anticipated Outcome  Self Feeding no goal set  Swallowing  supervision   Basic self-care  min A  Toileting  min A   Bathroom Transfers min A  Bowel/Bladder  manage bowel and bladder with min assist  Transfers  min assist  Locomotion  min assist short distances using LRAD  Communication     Cognition  min assist  Pain  n/a  Safety/Judgment  manage safety with cues/reminders   Therapy Plan: PT Intensity: Minimum of 1-2 x/day ,45 to 90 minutes PT Frequency: 5 out of 7 days PT Duration Estimated Length of Stay: 3-4 weeks OT Intensity: Minimum of 1-2 x/day, 45 to 90 minutes OT Frequency: 5 out of 7 days OT Duration/Estimated Length of Stay: 3-4 weeks SLP Intensity: Minumum of 1-2 x/day, 30 to 90 minutes SLP Frequency: 3 to 5 out of 7 days SLP Duration/Estimated Length of Stay: 14-20 days   Due to the current state of emergency, patients may not be receiving their 3-hours of Medicare-mandated therapy.   Team  Interventions: Nursing Interventions Patient/Family Education, Bowel Management, Bladder Management, Disease Management/Prevention, Medication Management, Discharge Planning, Dysphagia/Aspiration Precaution Training  PT interventions Ambulation/gait training, Community reintegration, DME/adaptive equipment instruction, Neuromuscular re-education, Psychosocial support, Stair training, UE/LE Strength taining/ROM, Wheelchair propulsion/positioning, Training and development officer, Discharge planning, Functional electrical stimulation, Pain management, Skin care/wound management, Therapeutic Activities, UE/LE Coordination activities, Cognitive remediation/compensation, Disease management/prevention, Functional mobility training, Patient/family education, Splinting/orthotics, Therapeutic Exercise, Visual/perceptual remediation/compensation  OT Interventions Balance/vestibular training, Discharge planning, Pain management, Self Care/advanced ADL retraining, Therapeutic Activities, UE/LE Coordination activities, Visual/perceptual remediation/compensation, Therapeutic Exercise, Skin care/wound managment, Functional mobility training, Cognitive remediation/compensation, Patient/family education, Disease mangement/prevention, Community reintegration, Engineer, drilling, Neuromuscular re-education, Splinting/orthotics, Psychosocial support, UE/LE Strength taining/ROM, Wheelchair propulsion/positioning  SLP Interventions Cognitive remediation/compensation, English as a second language teacher, Functional tasks, Environmental controls, Dysphagia/aspiration precaution training, Internal/external aids, Patient/family education  TR Interventions    SW/CM Interventions Discharge Planning, Psychosocial Support, Patient/Family Education   Barriers to Discharge MD  Medical stability and Behavior  Nursing      PT      OT Incontinence, Weight    SLP      SW       Team Discharge Planning: Destination: PT-Home ,OT- Home ,  SLP-Home Projected Follow-up: PT-Home health PT, 24 hour supervision/assistance, OT-  Home health OT, SLP-Home Health SLP, 24 hour supervision/assistance Projected Equipment Needs: PT-To be determined, OT- To be determined, SLP-To be determined Equipment Details: PT- , OT-Shower TBD d/t potentially landlord redoing entire bathroom Patient/family involved in discharge planning: PT- Patient, Family member/caregiver,  OT-Patient,  Family member/caregiver, SLP-Patient  MD ELOS: 16-20d Medical Rehab Prognosis:  Fair Assessment:  85 year old right-handed female with history of hyperlipidemia, , CVA maintained on aspirin, hypertension, gout. She quit smoking 26 years ago. Per chart review patient lives alone. Independent with assistive device. 1 level home with ramped entrance. Daughter and son provide assistance as needed. Presented 02/02/2021 [facility] with recent fall as well as reports of some memory issues for the past 2 to 3 weeks. Noted shortness of breath with noted hypoxia oxygen saturations 84%. Urinalysis positive nitrite, chest x-ray mild pulmonary vascular congestion, cranial CT scan negative for acute changes, WBC 15,800, troponin negative, CK 327, lactic acid within normal limits, COVID negative, blood cultures no growth to date. Patient was placed on Rocephin for UTI as well as suspected pneumonia. Neurology consulted 02/05/2019 for reports of left-sided weakness. Cranial CT scan repeated 02/05/2019 showing patchy hypodensity in the white matter right greater than left. No midline shift. MRI as well as MRA of head and neck showed acute infarct deep white matter on the right and a watershed territory likely due to severe stenosis of M1 segment. Small acute infarct left lateral posterior temporal lobe. Occluded left A1 segment. Severe stenosis right M1 segment. Patient was transferred to [facility] underwent balloon angioplasty x3 of preocclusive symptomatic right MCA M1 segment per  interventional radiology. Echocardiogram with ejection fraction of 70% without emboli. Follow-up MRI after angioplasty showed new punctate acute early subacute infarct within the right temporal occipital lobe. Repeat MRSA 02/07/2019 persistent right M1 focal stenosis. Patient presently on low-dose aspirin 81 mg daily as well as Brilinta for CVA prophylaxis. Subcutaneous heparin for DVT prophylaxis. Dysphasia #3 nectar thick liquid diet. Hospital course bouts of restlessness and agitation she was placed on low-dose nightly Seroquel. Latest follow-up urine study culture multi-species Rocephin has been completed. Leukocytosis improved 7.3.    Now requiring 24/7 Rehab RN,MD, as well as CIR level PT, OT and SLP.  Treatment team will focus on ADLs and mobility with goals set at [facility] See Team Conference Notes for weekly updates to the plan of care

## 2019-02-15 NOTE — Progress Notes (Signed)
Speech Language Pathology Daily Session Note  Patient Details  Name: Christy Carter MRN: [id number] Date of Birth: 10-24-1933  Today's Date: 02/15/2019 SLP Individual Time: 1510-1600 SLP Individual Time Calculation (min): 50 min  Short Term Goals: Week 1: SLP Short Term Goal 1 (Week 1): Pt will consume dys 2 textures and nectar thick liquids with supervision cues for use of swallowing precautions and minimal overt s/s of aspiration. SLP Short Term Goal 2 (Week 1): Pt will consume therapeutic trials of thin liquids with supervision cues for use of swallowing precautions and minimal overt s/s of aspiration over 2 consecutive sessions prior to repeat instrumental assessment. SLP Short Term Goal 3 (Week 1): Pt will locate items to the left of midline in >25% of opportunities during basic, familiar tasks with mod assist multimodal cues. SLP Short Term Goal 4 (Week 1): Pt will complete basic, familiar tasks wtih mod assist multimodal cues for functional problem solving. SLP Short Term Goal 5 (Week 1): Pt will utilize external aids to recall basic, daily information with mod assist verbal cues.  Skilled Therapeutic Interventions: Skilled ST services focused on swallow and cognitive skills. Pt's daughter present. Pt was orientated to place and time with mod A verbal cues for visual aids posted in room. SLP facilitated basic problem solving and sustained attention during card sorting task by two colors, pt required min A verbal cues for problem solving and sustained attention in 7 minute intervals with mod A verbal cues for redirection. Pt demonstrated problem solving and sustained attention in novel card task WAR, pt demonstrated ability to name highest card with mod A verbal cues for problem solving secondary to reduced sustained attention and increase impulsivity as task continued. Pt demonstrated ability to locate cards left of midline with min A verbal cues. Pt required min A verbal cues for oral care  piror to PO consumption trials of thin via cup, pt consumed 4oz demonstrated double swallow to clear bolus, x1 delayed cough and x2 wet vocal quality in 10 sips. Pt was left in room with duaghter call bell within reach and bed alarm set. ST recommends to continue skilled ST services.      Pain Pain Assessment Pain Scale: Faces Pain Score: 0-No pain  Therapy/Group: Individual Therapy  Joahan Swatzell  [facility] 02/15/2019, 4:04 PM

## 2019-02-16 ENCOUNTER — Inpatient Hospital Stay (HOSPITAL_COMMUNITY): Payer: Medicare Other

## 2019-02-16 ENCOUNTER — Inpatient Hospital Stay (HOSPITAL_COMMUNITY): Payer: Medicare Other | Admitting: Occupational Therapy

## 2019-02-16 NOTE — Progress Notes (Signed)
Social Work Patient ID: Christy Carter, female   DOB: 21-Jan-1934, 85 y.o.   MRN: [id number] Met with pt and daughter was here to inform team conference goals min assist level and target discharge 10/28. Both son and daughter will be providing care at discharge. Aware she will require 24 hr physical care. Daughter has been to therapies with pt.

## 2019-02-16 NOTE — Progress Notes (Signed)
Speech Language Pathology Daily Session Note  Patient Details  Name: Christy Carter MRN: [id number] Date of Birth: Sep 20, 1933  Today's Date: 02/16/2019 SLP Individual Time: 1000-1030 SLP Individual Time Calculation (min): 30 min  Short Term Goals: Week 1: SLP Short Term Goal 1 (Week 1): Pt will consume dys 2 textures and nectar thick liquids with supervision cues for use of swallowing precautions and minimal overt s/s of aspiration. SLP Short Term Goal 2 (Week 1): Pt will consume therapeutic trials of thin liquids with supervision cues for use of swallowing precautions and minimal overt s/s of aspiration over 2 consecutive sessions prior to repeat instrumental assessment. SLP Short Term Goal 3 (Week 1): Pt will locate items to the left of midline in >25% of opportunities during basic, familiar tasks with mod assist multimodal cues. SLP Short Term Goal 4 (Week 1): Pt will complete basic, familiar tasks wtih mod assist multimodal cues for functional problem solving. SLP Short Term Goal 5 (Week 1): Pt will utilize external aids to recall basic, daily information with mod assist verbal cues.  Skilled Therapeutic Interventions: Skilled ST services focused on swallow and cognitive skills. SLP facilitated oral care piror to PO consumption of thin via cup, pt consumed 4oz with no overt s/s aspiration including dry vocal quality. SLP initiated water protocol and provided education, pt stated understanding. SLP also facilitated basic problem solving , error awareness and left scanning in novel card task sorting cards by 6 colors with various shapes/numbers of shapes, pt required mod A verbal cues for problem solving with ability to recognize/correct errors in 3/5 opportunities and supervision A verbal cues to scan left. Pt demonstrated sustained attention in 7 minute intervals with supervision A verbal cues for redirection. Pt was left in room with call bell within reach and chair alarm set. ST recommends to  continue skilled ST services.      Pain Pain Assessment Pain Score: 0-No pain  Therapy/Group: Individual Therapy  Rasean Joos  [facility] 02/16/2019, 10:36 AM

## 2019-02-16 NOTE — Patient Care Conference (Signed)
Inpatient RehabilitationTeam Conference and Plan of Care Update Date: 02/16/2019   Time: 11:30 AM    Patient Name: Christy Carter      Medical Record Number: [id number]  Date of Birth: Sep 17, 1933 Sex: Female         Room/Bed: 4W26C/4W26C-01 Payor Info: Payor: [employer] MEDICARE / Plan: [facility] MEDICARE / Product Type: *No Product type* /    Admit Date/Time:  02/11/2019  2:15 PM  Primary Diagnosis:  Acute bilat watershed infarction [facility])  Patient Active Problem List   Diagnosis Date Noted  . Acute bilat watershed infarction [facility]) 02/11/2019  . Agitation requiring sedation protocol 02/09/2019  . Dysphagia 02/09/2019  . Stroke (cerebrum) ([place]) - R watershed d/t large vessel dz & small L temp lobe & R cerebellar infarcts 02/05/2019  . Middle cerebral artery stenosis, right 02/05/2019  . Acute respiratory failure ([place]) 02/03/2019  . Acute respiratory failure with hypoxemia ([place]) 02/03/2019  . Abnormal renal function 01/19/2019  . Pain in left foot 12/19/2018  . Hematuria 08/02/2018  . Influenza A 03/02/2018  . Urinary tract infection without hematuria 03/02/2018  . Sepsis ([place]) 02/11/2018  . CAP (community acquired pneumonia) 02/11/2018  . CKD (chronic kidney disease), stage III 02/11/2018  . Memory loss, short term 02/10/2018  . Flu vaccine need 02/10/2018  . Chest pain 02/03/2018  . Acute upper respiratory infection 02/03/2018  . Gastroesophageal reflux disease without esophagitis 02/03/2018  . Encounter for general adult medical examination with abnormal findings 01/13/2018  . Chronic low back pain 01/13/2018  . Need for vaccination against Streptococcus pneumoniae using pneumococcal conjugate vaccine 7 01/13/2018  . Dysuria 01/13/2018  . 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 Pelvic pain 10/08/2017  . Gout 06/15/2017  . Mixed hyperlipidemia 06/15/2017  . Inflammatory polyarthropathy ([place]) 06/15/2017  . Neuralgia and neuritis, unspecified 06/15/2017  .  Major depressive disorder, recurrent, mild ([place]) 06/15/2017  . Other vitamin B12 deficiency anemias 06/15/2017  . Hypothyroidism 10/17/2008  . OVERWEIGHT/OBESITY 10/17/2008  . SLEEP APNEA, OBSTRUCTIVE 10/17/2008  . Essential hypertension 10/17/2008  . GALLSTONES 10/17/2008  . DYSPNEA 10/17/2008  . Obesity 10/17/2008    Expected Discharge Date: Expected Discharge Date: 03/09/19  Team Members Present: Physician leading conference: Dr. Courtney Heys Social Worker Present: Ovidio Kin, LCSW Nurse Present: Dorien Chihuahua, RN PT Present: Michaelene Song, PT OT Present: Clyda Greener, OT SLP Present: Charolett Bumpers, SLP PPS Coordinator present : Gunnar Fusi, SLP     Current Status/Progress Goal Weekly Team Focus  Bowel/Bladder   Pt is incontinent at times B/B. LBM 02/13/2019.  Encourage use of PRN laxatives/stool softeners in order to maintain regular bowel pattern. Encourage timed toileting.  Assist with toileting needs PRN.   Swallow/Nutrition/ Hydration   dys 2 and NTL, trials of thin  Supervision A  water protocol, swallow strategies   ADL's   mod A of 2 to rise to stand and hold static balance with UE support,  set up UB bathing, min -mod UB dressing, mod A LB bathing, total LB dressing; using LUE as and active A and improving L sh flexion  min stand balance, bathing, LB dressing, toileting and toilet transfers  ADL training, postural control, balance, LUE NMR, pt/family education   Mobility   max assist for bed mobility, max +2 for sit<>stand with steady from elevated bed, dependent transfer with steady, mod assist w/c propulsion  min assist  transfers, bed mobility, gait initiation, L attention, midline orientation, balance   Communication  Safety/Cognition/ Behavioral Observations  Max- Mod A  Min A  Basic problem solving, sustained attention, error awareness and recall with aid   Pain   No complaints of pain.  Remain pain free.  Assess pain Q shift and PRN.    Skin   No skin issues.  Maintain skin integrity and prevent skin breakdown.  Assess skin Q shift and PRN.      *See Care Plan and progress notes for long and short-term goals.     Barriers to Discharge  Current Status/Progress Possible Resolutions Date Resolved   Nursing                  PT  Decreased caregiver support                 OT                  SLP                SW                Discharge Planning/Teaching Needs:  HOme with son and daughter providing 24 hr care. Daughter has been here and observed in therapies.      Team Discussion:  Goals min assist level, currently mod-total care. Using stedy to transfer. Having right foot pain-MD aware and addressing. Inattention to the left working on. Dys 2 trials of thin. Making progress in her therapies.  Revisions to Treatment Plan:  DC 10/28    Medical Summary Current Status: Sundowning at night, left upper extremity strength improving. Weekly Focus/Goal: Work on sleep, work on endurance  Barriers to Discharge: Medical stability   Possible Resolutions to Barriers: Continue rehabilitation program   Continued Need for Acute Rehabilitation Level of Care: The patient requires daily medical management by a physician with specialized training in physical medicine and rehabilitation for the following reasons: Direction of a multidisciplinary physical rehabilitation program to maximize functional independence : Yes Medical management of patient stability for increased activity during participation in an intensive rehabilitation regime.: Yes Analysis of laboratory values and/or radiology reports with any subsequent need for medication adjustment and/or medical intervention. : Yes   I attest that I was present, lead the team conference, and concur with the assessment and plan of the team. Teleconference held due to COVID 19   Kanesha Cadle, Gardiner Rhyme 02/16/2019, 1:00 PM

## 2019-02-16 NOTE — Progress Notes (Signed)
Physical Therapy Session Note  Patient Details  Name: Christy Carter MRN: [id number] Date of Birth: 12-08-1933  Today's Date: 02/16/2019 PT Individual Time: 0830-0930 PT Individual Time Calculation (min): 60 min   Short Term Goals: Week 1:  PT Short Term Goal 1 (Week 1): Pt will perform supine<>sit with mod assist PT Short Term Goal 2 (Week 1): Pt will perform sit<>stand with max assist of 1 PT Short Term Goal 3 (Week 1): Pt will perform bed<>chair transfers with max assist of 1, not using mechanical lift. PT Short Term Goal 4 (Week 1): Pt will initiate gait  Skilled Therapeutic Interventions/Progress Updates:  Pt resting in bed.  No pain reported  PT donned bil TEDS and bil socks.  Pt assisted with donning hospital pants with max assist, including rolling in flat bed, no rails, supervision.  neuromuscular re-education via multimodal cues for side lying: bil hip knee flex/extension.   In supine: bil bridging with assistance, cervical flexion.   Rolling R with superviison, sitting up with mod assist and mod cues.  Pt sat EOB without LOB , cues for mild leaning L.  She brushed her hair using R hand, with min cue to attend to L side of head.  Sit> squat +2 , x 2.  Squat pivot to L with +2 mod assist, cues for head/hips relationship.  neuromuscular re-education via forced use, multimodal cues for use of bil UEs for w/c propulsion.  Hand over hand assist to use L hand effectively.  At end of session, pt seated in w/c with seat belt alarm set and needs at hand.     Therapy Documentation Precautions:  Precautions Precautions: Fall, Other (comment) Precaution Comments: L inattention, left hemiparesis Restrictions Weight Bearing Restrictions: No          Therapy/Group: Individual Therapy  Emy Angevine 02/16/2019, 9:41 AM

## 2019-02-16 NOTE — Progress Notes (Signed)
[place] PHYSICAL MEDICINE & REHABILITATION PROGRESS NOTE   Subjective/Complaints:  Patient states that she woke up without close and does not remember how.  No nursing notes.  She does not report any nightmares or confusion this morning  ROS- denies SOB, CP, N/V/D.   Objective:   No results found. Recent Labs    02/14/19 0748  WBC 10.7*  HGB 14.1  HCT 44.5  PLT 398   Recent Labs    02/14/19 0748  NA 139  K 4.2  CL 98  CO2 26  GLUCOSE 105*  BUN 18  CREATININE 1.03*  CALCIUM 9.5    Intake/Output Summary (Last 24 hours) at 02/16/2019 0948 Last data filed at 02/15/2019 1821 Gross per 24 hour  Intake 120 ml  Output -  Net 120 ml     Physical Exam: Vital Signs Blood pressure (!) 144/71, pulse 66, temperature 98.4 F (36.9 C), resp. rate 17, height _0  (1.702 m), weight 104.8 kg, SpO2 96 %. Vitals reviewed Constitutional:No distress. obese; sitting up in chair at bedside, alone; brighter affect, NAD HENT:  Head:Normocephalicand atraumatic.  Eyes:EOMare normal.  Neck:Normal range of motion.No tracheal deviationpresent. Cardiovascular:Normal rateand regular rhythm. Murmurheard. Respiratory:Effort normal. Norespiratory distress. She hasno wheezes.  ON:GEXB. She exhibitsno distension. There isno abdominal tenderness.  Musculoskeletal:  General: Edemapresent.  Neurological: Pt in WC . Still appears fatigued. RUE and RLE grossly 4- to 4/5. LUE and LLE 3-/5, mild left inattention. Speech dysarthric. Sensed gross touch, decreased pain left arm and leg.  Decreased finger to thumb opposition on the left side.  The patient has decreased rapid alternating movement of supination pronation in the left upper limb Skin: Skin iswarmand dry. A few scattered ecchymoses esp on arms B/L Psychiatric: No emotional lability or agitation   R foot- mild dorsal swelling- no bruising- no pain with foot and ankle rom  Oriented to person and  hosp    Assessment/Plan: 1. Functional deficits secondary to Left hemiplegia with dysphagia/dysarthriasecondary to right watershed territory infarction in the setting of severe stenosis right M1 segment status post revascularization which require 3+ hours per day of interdisciplinary therapy in a comprehensive inpatient rehab setting.  Physiatrist is providing close team supervision and 24 hour management of active medical problems listed below.  Physiatrist and rehab team continue to assess barriers to discharge/monitor patient progress toward functional and medical goals  Care Tool:  Bathing  Bathing activity did not occur: Safety/medical concerns Body parts bathed by patient: Right arm, Left arm, Chest, Abdomen, Front perineal area, Face, Left upper leg, Right upper leg   Body parts bathed by helper: Buttocks, Right lower leg, Left lower leg     Bathing assist Assist Level: Moderate Assistance - Patient 50 - 74%     Upper Body Dressing/Undressing Upper body dressing   What is the patient wearing?: Pull over shirt    Upper body assist Assist Level: Moderate Assistance - Patient 50 - 74%    Lower Body Dressing/Undressing Lower body dressing      What is the patient wearing?: Underwear/pull up, Pants     Lower body assist Assist for lower body dressing: 2 Helpers     Toileting Toileting    Toileting assist Assist for toileting: 2 Helpers     Transfers Chair/bed transfer  Transfers assist     Chair/bed transfer assist level: 2 Helpers     Locomotion Ambulation   Ambulation assist   Ambulation activity did not occur: Safety/medical concerns  Walk 10 feet activity   Assist  Walk 10 feet activity did not occur: Safety/medical concerns        Walk 50 feet activity   Assist Walk 50 feet with 2 turns activity did not occur: Safety/medical concerns         Walk 150 feet activity   Assist Walk 150 feet activity did not occur:  Safety/medical concerns         Walk 10 feet on uneven surface  activity   Assist Walk 10 feet on uneven surfaces activity did not occur: Safety/medical concerns         Wheelchair     Assist Will patient use wheelchair at discharge?: (TBD but anticipate will need it) Type of Wheelchair: Manual Wheelchair activity did not occur: Safety/medical concerns  Wheelchair assist level: Minimal Assistance - Patient > 75% Max wheelchair distance: 10    Wheelchair 50 feet with 2 turns activity    Assist    Wheelchair 50 feet with 2 turns activity did not occur: Safety/medical concerns       Wheelchair 150 feet activity     Assist  Wheelchair 150 feet activity did not occur: Safety/medical concerns       Blood pressure (!) 144/71, pulse 66, temperature 98.4 F (36.9 C), resp. rate 17, height _0  (1.702 m), weight 104.8 kg, SpO2 96 %.  Medical Problem List and Plan: 1.Left hemiplegia with dysphagia/dysarthriasecondary to right watershed territory infarction in the setting of severe stenosis right M1 segment status post revascularization as well as small left lateral posterior temporal lobe infarct in right middle cerebellar peduncle infarct  Team conference today please see physician documentation under team conference tab, met with team face-to-face to discuss problems,progress, and goals. Formulized individual treatment plan based on medical history, underlying problem and comorbidities. CIR PT, OT -ELOS 14-20+ days, min assist to supervision goals 2. Antithrombotics: -DVT/anticoagulation:Subcutaneous heparin -antiplatelet therapy: Aspirin 81 mg daily, Brilinta 90 mg twice daily 3. Pain Management:Tylenol as needed  -will add voltaren gel for R foot 4. Mood:Seroquel 12.5 mg nightly -antipsychotic agents: N/A 5. Neuropsych: This patientisintermittentlycapable of making decisions onherown behalf. 6.  Skin/Wound Care:Routine skin checks 7. Fluids/Electrolytes/Nutrition:Routine in and outs with follow-up chemistries  Labs Monday 8. Dysphagia. Dysphasia #3 nectar liquids currently -pt was struggling with mastication, had some coughing when a bacon bit went down her throat 9. Hypertension. Maxide 37.5-25 mg daily, clonidine patch 0.1 mg change every 7 days, Zebeta 2.5 mg daily.   Vitals:   02/15/19 1939 02/16/19 0512  BP: (!) 129/59 (!) 144/71  Pulse: 62 66  Resp: 18 17  Temp: 98.5 F (36.9 C) 98.4 F (36.9 C)  SpO2: 96% 96%  Overall controlled 10/7 10. Hyperlipidemia. Lipitor 11. Hypothyroidism. Synthroid 12. R foot pain- Mid foot OA, no current complaints today     LOS: 5 days A FACE TO FACE EVALUATION WAS PERFORMED  Charlett Blake 02/16/2019, 9:48 AM

## 2019-02-16 NOTE — Progress Notes (Signed)
Occupational Therapy Session Note  Patient Details  Name: Christy Carter MRN: [id number] Date of Birth: 1933-11-19  Today's Date: 02/16/2019 OT Individual Time: [id number] Individual Time Calculation (min): 69 min    Short Term Goals: Week 1:  OT Short Term Goal 1 (Week 1): Pt will don shirt with mod A OT Short Term Goal 2 (Week 1): Pt will maintain static EOB sitting balance with min A while completing ADL task OT Short Term Goal 3 (Week 1): Pt will complete LB bathing bed level with LRAD with mod A OT Short Term Goal 4 (Week 1): Pt will require only mod cueing for attending to L side of body during UB bathing  Skilled Therapeutic Interventions/Progress Updates:    Pt worked on standing with use of the Standing frame during session.  She was able to tolerate standing for intervals of 5-7 mins while engaged in LUE functional use.  She needed max instructional cueing with min assist to maintain sustained attention to the task and follow the directions given.  Multiple times when told to pick up the playing card from the deck with her left hand and then place it on another pile, she would continue to make a separate pile.  Max demonstrational cueing for upright trunk posture in standing and for midline orientation secondary to left lean. She was able to tolerate 15 seconds intervals of releasing the belt while standing to help increase active hip extension. Mod assist still needed to maintain upright posture during these attempts.  Finished session with return to the room with pt left sitting up in the wheelchair with call button and phone in reach and her daughter present.    Therapy Documentation Precautions:  Precautions Precautions: Fall, Other (comment) Precaution Comments: L inattention, left hemiparesis Restrictions Weight Bearing Restrictions: No  Pain: Pain Assessment Pain Score: 0-No pain ADL: See Care Tool Section for some details of mobility and selfcare  Therapy/Group:  Individual Therapy  Genita Nilsson OTR/L 02/16/2019, 4:31 PM

## 2019-02-16 NOTE — Progress Notes (Signed)
Occupational Therapy Session Note  Patient Details  Name: Christy Carter MRN: [id number] Date of Birth: May 07, 1934  Today's Date: 02/16/2019 OT Individual Time: [id number] OT Individual Time Calculation (min): 26 min    Short Term Goals: Week 1:  OT Short Term Goal 1 (Week 1): Pt will don shirt with mod A OT Short Term Goal 2 (Week 1): Pt will maintain static EOB sitting balance with min A while completing ADL task OT Short Term Goal 3 (Week 1): Pt will complete LB bathing bed level with LRAD with mod A OT Short Term Goal 4 (Week 1): Pt will require only mod cueing for attending to L side of body during UB bathing  Skilled Therapeutic Interventions/Progress Updates:    Pt completed sit to stand transitions with use of the [facility] for support.  She reported increased fatigue multiple times.  Total assist +2 (pt 25%) for completion of task and for maintaining standing for 1-2 mins.  In standing, she demonstrated slight pushing to the left with increased lean and decreased sustained activation of the LLE while standing.  Increased forward trunk flexion as well while standing with decreased hip extension.  She completed 3 intervals of standing.  Pt's daughter present as well and supportive.  Pt finished session with soft touch call button in reach and alarm belt in place.    Therapy Documentation Precautions:  Precautions Precautions: Fall, Other (comment) Precaution Comments: L inattention, left hemiparesis Restrictions Weight Bearing Restrictions: No  Pain: Pain Assessment Pain Scale: Faces Pain Score: 0-No pain ADL: See Care Tool Section for some details of ADL tasks  Therapy/Group: Individual Therapy  Kashius Dominic OTR/L 02/16/2019, 12:13 PM

## 2019-02-17 ENCOUNTER — Inpatient Hospital Stay (HOSPITAL_COMMUNITY): Payer: Medicare Other

## 2019-02-17 ENCOUNTER — Inpatient Hospital Stay (HOSPITAL_COMMUNITY): Payer: Medicare Other | Admitting: Physical Therapy

## 2019-02-17 ENCOUNTER — Inpatient Hospital Stay (HOSPITAL_COMMUNITY): Payer: Medicare Other | Admitting: Speech Pathology

## 2019-02-17 ENCOUNTER — Inpatient Hospital Stay (HOSPITAL_COMMUNITY): Payer: Medicare Other | Admitting: Occupational Therapy

## 2019-02-17 MED ORDER — SENNOSIDES-DOCUSATE SODIUM 8.6-50 MG PO TABS
1.0000 | ORAL_TABLET | Freq: Every day | ORAL | Status: DC
  Administered 2019-02-18 – 2019-02-25 (×8): 1 via ORAL
  Filled 2019-02-17 (×8): qty 1

## 2019-02-17 NOTE — Progress Notes (Signed)
Physical Therapy Session Note  Patient Details  Name: Christy Carter MRN: [id number] Date of Birth: 09/18/1933  Today's Date: 02/17/2019 PT Individual Time: [id number] PT Individual Time Calculation (min): 60 min   Short Term Goals: Week 1:  PT Short Term Goal 1 (Week 1): Pt will perform supine<>sit with mod assist PT Short Term Goal 2 (Week 1): Pt will perform sit<>stand with max assist of 1 PT Short Term Goal 3 (Week 1): Pt will perform bed<>chair transfers with max assist of 1, not using mechanical lift. PT Short Term Goal 4 (Week 1): Pt will initiate gait  Skilled Therapeutic Interventions/Progress Updates:    Received pt in stedy in bathroom with RN and rehab tech, PT and rehab tech took over. Pt agreeable to PT and denied any pain during today's session. Today's session focused on functional mobility/transfers, standing tolerance, LE strength, postural alignment, balance, and energy conservation strategies. Pt transferred stand<>sit and sit<>stand from stedy to toilet with 2 helpers for clothing management and standing balance. Pt required mod verbal cues to correct L lateral lean and remain upright for clothing management. Pt transferred sit<>stand at sink from stedy mod A and performed static standing balance while washing hands and brushing hair. Attempted sit<>stand from [facility] at parallel bars +2 helpers but pt unable to fully stand due to bilateral quad weakness. Pt transferred sit<>stand at standing frame 4 mins x 1 trial and 5 mins x 1 trial while reaching and stacking cones across midline CGA. Pt performed WC propulsion 74ft with mod A for hand over hand assist on LUE and verbal cueing for hand placement and propulsion technique. Concluded session with pt sitting in WC, needs within reach, and seatbelt alarm on.   Therapy Documentation Precautions:  Precautions Precautions: Fall, Other (comment) Precaution Comments: L inattention, left hemiparesis Restrictions Weight Bearing  Restrictions: No  Pain: Pain Assessment Pain Scale: 0-10 Pain Score: 0-No pain  Therapy/Group: Individual Therapy  [place] PT, DPT   02/17/2019, 3:11 PM

## 2019-02-17 NOTE — Progress Notes (Signed)
Occupational Therapy Session Note  Patient Details  Name: Christy Carter MRN: [id number] Date of Birth: 06/05/1933  Today's Date: 02/17/2019 OT Individual Time: 0800-0900 OT Individual Time Calculation (min): 60 min    Short Term Goals: Week 1:  OT Short Term Goal 1 (Week 1): Pt will don shirt with mod A OT Short Term Goal 2 (Week 1): Pt will maintain static EOB sitting balance with min A while completing ADL task OT Short Term Goal 3 (Week 1): Pt will complete LB bathing bed level with LRAD with mod A OT Short Term Goal 4 (Week 1): Pt will require only mod cueing for attending to L side of body during UB bathing  Skilled Therapeutic Interventions/Progress Updates:    Upon entering the room, pt supine in bed and agreeable to OT intervention. Supine >sit with max A to EOB. Pt sitting on EOB with CGA. Sit >stand into stedy with mod lifting assistance. Pt declined bathing and dressing this session. Pt engaged in in sit <>stand from elevated stedy seat with min A x 2 reps and standing for 1 minute each time. Pt sitting in wheelchair at sink for grooming tasks. Pt utilized B UEs for tasks without drops from hand. Pt working on B coordination task to fold laundry items with increased time. L UE much slower but able to functionally complete task appropriately. Pt able to demonstrate opposition with L and but unable to coordinate movements with R hand for rapid alternating movements. Pt remained seated in wheelchair with chair alarm belt donned and call bell within reach.   Therapy Documentation Precautions:  Precautions Precautions: Fall, Other (comment) Precaution Comments: L inattention, left hemiparesis Restrictions Weight Bearing Restrictions: No Pain: Pain Assessment Pain Scale: 0-10 Pain Score: 10-Worst pain ever Pain Type: Acute pain Pain Location: Back Pain Orientation: Lower Pain Descriptors / Indicators: Aching;Discomfort Pain Frequency: Occasional Pain Onset: With Activity Pain  Intervention(s): Medication (See eMAR)(tylenol given)   Therapy/Group: Individual Therapy  Gypsy Decant 02/17/2019, 12:18 PM

## 2019-02-17 NOTE — Progress Notes (Signed)
SLP Cancellation Note  Patient Details Name: Christy Carter MRN: [id number] DOB: 15-Feb-1934   Cancelled treatment:        Pt missed 45 minutes of skilled ST as she was off unit at chest x-ray.                                                                                                 Hutson Luft 02/17/2019, 2:10 PM

## 2019-02-17 NOTE — Progress Notes (Signed)
[place] PHYSICAL MEDICINE & REHABILITATION PROGRESS NOTE   Subjective/Complaints:  No issues overnite per OT improved LUE strenght.  Slept well   ROS- denies SOB, CP, N/V/D.   Objective:   No results found. No results for input(s): WBC, HGB, HCT, PLT in the last 72 hours. No results for input(s): NA, K, CL, CO2, GLUCOSE, BUN, CREATININE, CALCIUM in the last 72 hours.  Intake/Output Summary (Last 24 hours) at 02/17/2019 0820 Last data filed at 02/16/2019 1849 Gross per 24 hour  Intake 160 ml  Output -  Net 160 ml     Physical Exam: Vital Signs Blood pressure (!) 131/96, pulse 65, temperature 97.8 F (36.6 C), resp. rate 18, height 5\' 7"  (1.702 m), weight 104.8 kg, SpO2 94 %. Vitals reviewed Constitutional:No distress. obese; sitting up in chair at bedside, alone; brighter affect, NAD HENT:  Head:Normocephalicand atraumatic.  Eyes:EOMare normal.  Neck:Normal range of motion.No tracheal deviationpresent. Cardiovascular:Normal rateand regular rhythm. Murmurheard. Respiratory:Effort normal. Norespiratory distress. She hasno wheezes. Crackles in lungs  [id number]. She exhibitsno distension. There isno abdominal tenderness.  Musculoskeletal:  General: Edemapresent.  Neurological: Pt in WC . Still appears fatigued. RUE and RLE grossly 4- to 4/5. LUE and LLE 3-/5, mild left inattention. Speech dysarthric. Sensed gross touch, decreased pain left arm and leg.  Decreased finger to thumb opposition on the left side.  The patient has decreased rapid alternating movement of supination pronation in the left upper limb Skin: Skin iswarmand dry. A few scattered ecchymoses esp on arms B/L Psychiatric: No emotional lability or agitation   Ext trace pedal edema R>L     Assessment/Plan: 1. Functional deficits secondary to Left hemiplegia with dysphagia/dysarthriasecondary to right watershed territory infarction in the setting of severe stenosis right M1  segment status post revascularization which require 3+ hours per day of interdisciplinary therapy in a comprehensive inpatient rehab setting.  Physiatrist is providing close team supervision and 24 hour management of active medical problems listed below.  Physiatrist and rehab team continue to assess barriers to discharge/monitor patient progress toward functional and medical goals  Care Tool:  Bathing  Bathing activity did not occur: Safety/medical concerns Body parts bathed by patient: Right arm, Left arm, Chest, Abdomen, Front perineal area, Face, Left upper leg, Right upper leg   Body parts bathed by helper: Buttocks, Right lower leg, Left lower leg     Bathing assist Assist Level: Moderate Assistance - Patient 50 - 74%     Upper Body Dressing/Undressing Upper body dressing   What is the patient wearing?: Pull over shirt    Upper body assist Assist Level: Moderate Assistance - Patient 50 - 74%    Lower Body Dressing/Undressing Lower body dressing      What is the patient wearing?: Underwear/pull up, Pants     Lower body assist Assist for lower body dressing: 2 Helpers     Toileting Toileting    Toileting assist Assist for toileting: 2 Helpers     Transfers Chair/bed transfer  Transfers assist     Chair/bed transfer assist level: 2 Helpers     Locomotion Ambulation   Ambulation assist   Ambulation activity did not occur: Safety/medical concerns          Walk 10 feet activity   Assist  Walk 10 feet activity did not occur: Safety/medical concerns        Walk 50 feet activity   Assist Walk 50 feet with 2 turns activity did not occur: Safety/medical concerns  Walk 150 feet activity   Assist Walk 150 feet activity did not occur: Safety/medical concerns         Walk 10 feet on uneven surface  activity   Assist Walk 10 feet on uneven surfaces activity did not occur: Safety/medical concerns          Wheelchair     Assist Will patient use wheelchair at discharge?: (TBD but anticipate will need it) Type of Wheelchair: Manual Wheelchair activity did not occur: Safety/medical concerns  Wheelchair assist level: Minimal Assistance - Patient > 75% Max wheelchair distance: 10    Wheelchair 50 feet with 2 turns activity    Assist    Wheelchair 50 feet with 2 turns activity did not occur: Safety/medical concerns       Wheelchair 150 feet activity     Assist  Wheelchair 150 feet activity did not occur: Safety/medical concerns       Blood pressure (!) 131/96, pulse 65, temperature 97.8 F (36.6 C), resp. rate 18, height 5\' 7"  (1.702 m), weight 104.8 kg, SpO2 94 %.  Medical Problem List and Plan: 1.Left hemiplegia with dysphagia/dysarthriasecondary to right watershed territory infarction in the setting of severe stenosis right M1 segment status post revascularization as well as small left lateral posterior temporal lobe infarct in right middle cerebellar peduncle infarct ELOS 10/28   - 2. Antithrombotics: -DVT/anticoagulation:Subcutaneous heparin -antiplatelet therapy: Aspirin 81 mg daily, Brilinta 90 mg twice daily 3. Pain Management:Tylenol as needed  -will add voltaren gel for R foot 4. Mood:Seroquel 12.5 mg nightly -antipsychotic agents: N/A 5. Neuropsych: This patientisintermittentlycapable of making decisions onherown behalf. 6. Skin/Wound Care:Routine skin checks 7. Fluids/Electrolytes/Nutrition:Routine in and outs with follow-up chemistries  Labs Monday 8. Dysphagia. Dysphasia #3 nectar liquids currently  9. Hypertension. Maxide 37.5-25 mg daily, clonidine patch 0.1 mg change every 7 days, Zebeta 2.5 mg daily.   Vitals:   02/16/19 1956 02/17/19 0443  BP: 131/63 (!) 131/96  Pulse: 70 65  Resp: 18 18  Temp: 98.4 F (36.9 C) 97.8 F (36.6 C)  SpO2: 96% 94%   Overall controlled 10/8 10. Hyperlipidemia. Lipitor 11. Hypothyroidism. Synthroid 12. R foot pain- Mid foot OA, no current complaints today     LOS: 6 days A FACE TO FACE EVALUATION WAS PERFORMED  Charlett Blake 02/17/2019, 8:20 AM

## 2019-02-17 NOTE — Progress Notes (Signed)
Physical Therapy Session Note  Patient Details  Name: Christy Carter MRN: [id number] Date of Birth: November 08, 1933  Today's Date: 02/17/2019 PT Individual Time: 1605-1650 PT Individual Time Calculation (min): 45 min   Short Term Goals: Week 1:  PT Short Term Goal 1 (Week 1): Pt will perform supine<>sit with mod assist PT Short Term Goal 2 (Week 1): Pt will perform sit<>stand with max assist of 1 PT Short Term Goal 3 (Week 1): Pt will perform bed<>chair transfers with max assist of 1, not using mechanical lift. PT Short Term Goal 4 (Week 1): Pt will initiate gait  Skilled Therapeutic Interventions/Progress Updates:   Pt received supine in bed and continues to demonstrate confusion and disorientation but agreeable to therapy session. Supine>sit, HOB half elevated and using bedrails, with CGA for steadying and increased time. Sit>stand EOB>stedy with mod +2 assist for lifting into standing. Stedy transfer to w/c.  Transported to/from gym in w/c. Sit>stand w/c>stedy with +2 mod/max assist for lifting into standing from lower surface. Stedy transfer to [facility]. Performed at least 15 sit<>stands elevated EOM<>stedy with mirror feedback for increased upright trunk posture and hip/knee extension - min assist for lifting/lowering - each stand pt performed L attention and L UE NMR task of grasping and placing horseshoes on top of mirror with manual assist for increased shoulder flexion - pt unable to maintain standing in order to place more than 1 horseshoe despite encouragement. Progressed to sit<>stand elevated EOM<>RW with min assist for lifting into standing and max multimodal cuing for proper UE placement for safety with AD. Stand pivot EOM>w/c using RW with +2 mod assist for balance and AD management with max multimodal cuing for sequencing of LE stepping and RW management. Transported back to room in w/c and pt left sitting in w/c with needs in reach, seat belt alarm on, and RN notified of pt's  position.  Therapy Documentation Precautions:  Precautions Precautions: Fall, Other (comment) Precaution Comments: L inattention, left hemiparesis Restrictions Weight Bearing Restrictions: No  Pain: Denies pain during session.   Therapy/Group: Individual Therapy  Tawana Scale, PT, DPT 02/17/2019, 1:39 PM

## 2019-02-18 ENCOUNTER — Inpatient Hospital Stay (HOSPITAL_COMMUNITY): Payer: Medicare Other | Admitting: *Deleted

## 2019-02-18 ENCOUNTER — Inpatient Hospital Stay (HOSPITAL_COMMUNITY): Payer: Medicare Other | Admitting: Occupational Therapy

## 2019-02-18 ENCOUNTER — Inpatient Hospital Stay (HOSPITAL_COMMUNITY): Payer: Medicare Other | Admitting: Speech Pathology

## 2019-02-18 ENCOUNTER — Inpatient Hospital Stay (HOSPITAL_COMMUNITY): Payer: Medicare Other | Admitting: Physical Therapy

## 2019-02-18 NOTE — Progress Notes (Signed)
Physical Therapy Weekly Progress Note  Patient Details  Name: Christy Carter MRN: [id number] Date of Birth: 15-Oct-1933  Beginning of progress report period: February 12, 2019 End of progress report period: February 18, 2019  Today's Date: 02/18/2019 PT Individual Time: 1005-1100 PT Individual Time Calculation (min): 55 min   Patient has met 3 of 4 short term goals. Patient making progress towards LTGs but continues to demonstrate increased confusion, impaired motor planning/processing, and L inattention. Pt has progressed to performing bed mobility with mod assist, sit<>stands using RW with mod assist, and squat pivot transfers with +2 mod assist. Performs w/c mobility 60f with mod assist and has  yet to initiate gait training at this time.   Patient continues to demonstrate the following deficits muscle weakness, decreased cardiorespiratoy endurance, impaired timing and sequencing, unbalanced muscle activation, motor apraxia, decreased coordination and decreased motor planning, decreased visual motor skills, decreased midline orientation and decreased attention to left, decreased initiation, decreased attention, decreased awareness, decreased problem solving, decreased safety awareness, decreased memory and delayed processing and decreased sitting balance, decreased standing balance, decreased postural control and decreased balance strategies and therefore will continue to benefit from skilled PT intervention to increase functional independence with mobility.  Patient progressing toward long term goals..  Continue plan of care.  PT Short Term Goals Week 1:  PT Short Term Goal 1 (Week 1): Pt will perform supine<>sit with mod assist PT Short Term Goal 1 - Progress (Week 1): Met PT Short Term Goal 2 (Week 1): Pt will perform sit<>stand with max assist of 1 PT Short Term Goal 2 - Progress (Week 1): Met PT Short Term Goal 3 (Week 1): Pt will perform bed<>chair transfers with max assist of 1, not using  mechanical lift. PT Short Term Goal 3 - Progress (Week 1): Met PT Short Term Goal 4 (Week 1): Pt will initiate gait PT Short Term Goal 4 - Progress (Week 1): Progressing toward goal Week 2:  PT Short Term Goal 1 (Week 2): Pt wil perform supine<>sit with min assist PT Short Term Goal 2 (Week 2): Pt will perform sit<>stand using LRAD with min assist, from chair height PT Short Term Goal 3 (Week 2): Pt will perform bed<>chair transfer using LRAD with mod assist of 1 PT Short Term Goal 4 (Week 2): Pt will ambulate at least 135fwith max assist using LRAD  Skilled Therapeutic Interventions/Progress Updates:  Ambulation/gait training;Community reintegration;DME/adaptive equipment instruction;Neuromuscular re-education;Psychosocial support;Stair training;UE/LE Strength taining/ROM;Wheelchair propulsion/positioning;Balance/vestibular training;Discharge planning;Functional electrical stimulation;Pain management;Skin care/wound management;Therapeutic Activities;UE/LE Coordination activities;Cognitive remediation/compensation;Disease management/prevention;Functional mobility training;Patient/family education;Splinting/orthotics;Therapeutic Exercise;Visual/perceptual remediation/compensation   Pt received sitting in w/c reporting increased fatigue but agreeable to therapy session. Pt continues to be confused and disoriented talking about how the therapy staff was at her house fixing her kitchen for her last night - therapist reoriented her to location and situation.  Transported to/from gym in w/c. Sit>stand w/c>stedy with +2 mod assist for lifting in to standing. [facility] elevated EOM<>stedy with min assist of 1 for lifting/lowering - once in standing pt demonstrates L LE sustained knee flexion and forward trunk flexion with inability to correct despite max multimodal cuing and mirror feedback. Therapist +2 assist donned lite gait harness and pt performed sit<>stand x5 elevated  EOM<>litegait with B UE support but was unable to achieve full upright stance despite max multimodal cuing due to sustained forward trunk flexion and B LE (L>R) sustained knee flexion. Therapist deemed unsafe to attempt ambulation at this time therefore doffed  litegait harness. Sit<>stand elevated EOM<>RW x4 with min assist of 1 and 2nd person present for safety - pt continues to demonstrate forward trunk flexion and sustained B LE knee flexion (L>R) with inability to correct despite mirror feedback and multimodal cuing with manual facilitation. Pt reporting significant fatigue with sit<>stand activity. Transitioned to sitting balance task of R/L lateral weighshift, L attention, and L UE NMR task using PEG board with close supervision for safety - pt required mod cuing to locate board on L side. L squat pivot transfer EOM>w/c with mod assist for lifting/pivoting hips. Transported back to room and pt requesting to return to sleep. R squat pivot w/c>EOB with +2 mod assist for lifting/pivoting hips due to increased fatigue. Sit>supine with mod assist for B LE management and trunk descent. Pt left supine in bed with needs in reach, bed alarm on, and pt quickly closing eyes to sleep.   Therapy Documentation Precautions:  Precautions Precautions: Fall, Other (comment) Precaution Comments: L inattention, left hemiparesis Restrictions Weight Bearing Restrictions: No  Pain:   Denies pain during session.  Therapy/Group: Individual Therapy  Tawana Scale, PT, DPT 02/18/2019, 6:42 PM

## 2019-02-18 NOTE — Progress Notes (Signed)
[place] PHYSICAL MEDICINE & REHABILITATION PROGRESS NOTE   Subjective/Complaints:  No issues overnite , per OT moving arm better   ROS- denies SOB, CP, N/V/D.   Objective:   Dg Chest 2 View  Result Date: 02/17/2019 CLINICAL DATA:  Stroke. EXAM: CHEST - 2 VIEW COMPARISON:  Radiographs of February 11, 2019. FINDINGS: The heart size and mediastinal contours are within normal limits. Both lungs are clear. The visualized skeletal structures are unremarkable. IMPRESSION: No active cardiopulmonary disease. Electronically Signed   By: Marijo Conception M.D.   On: 02/17/2019 12:05   No results for input(s): WBC, HGB, HCT, PLT in the last 72 hours. No results for input(s): NA, K, CL, CO2, GLUCOSE, BUN, CREATININE, CALCIUM in the last 72 hours.  Intake/Output Summary (Last 24 hours) at 02/18/2019 0859 Last data filed at 02/18/2019 0824 Gross per 24 hour  Intake 120 ml  Output 2 ml  Net 118 ml     Physical Exam: Vital Signs Blood pressure (!) 138/58, pulse (!) 58, temperature 98.5 F (36.9 C), resp. rate 18, height 5\' 7"  (1.702 m), weight 104.8 kg, SpO2 98 %. Vitals reviewed Constitutional:No distress. obese; sitting up in chair at bedside, alone; brighter affect, NAD HENT:  Head:Normocephalicand atraumatic.  Eyes:EOMare normal.  Neck:Normal range of motion.No tracheal deviationpresent. Cardiovascular:Normal rateand regular rhythm. Murmurheard. Respiratory:Effort normal. Norespiratory distress. She hasno wheezes. Crackles in lungs  [id number]. She exhibitsno distension. There isno abdominal tenderness.  Musculoskeletal:  General: Edemapresent.  Neurological: Pt in WC . Still appears fatigued. RUE and RLE grossly 4- to 4/5. LUE and LLE 3-/5, mild left inattention. Speech dysarthric. Sensed gross touch, decreased pain left arm and leg.  Decreased finger to thumb opposition on the left side.  The patient has decreased rapid alternating movement of supination  pronation in the left upper limb Skin: Skin iswarmand dry. A few scattered ecchymoses esp on arms B/L Psychiatric: No emotional lability or agitation   Ext trace pedal edema R>L     Assessment/Plan: 1. Functional deficits secondary to Left hemiplegia with dysphagia/dysarthriasecondary to right watershed territory infarction in the setting of severe stenosis right M1 segment status post revascularization which require 3+ hours per day of interdisciplinary therapy in a comprehensive inpatient rehab setting.  Physiatrist is providing close team supervision and 24 hour management of active medical problems listed below.  Physiatrist and rehab team continue to assess barriers to discharge/monitor patient progress toward functional and medical goals  Care Tool:  Bathing  Bathing activity did not occur: Safety/medical concerns Body parts bathed by patient: Face   Body parts bathed by helper: Right arm, Left arm, Chest, Abdomen, Front perineal area, Buttocks, Right upper leg, Left upper leg, Right lower leg, Left lower leg     Bathing assist Assist Level: Maximal Assistance - Patient 24 - 49%     Upper Body Dressing/Undressing Upper body dressing   What is the patient wearing?: Button up shirt    Upper body assist Assist Level: Minimal Assistance - Patient > 75%    Lower Body Dressing/Undressing Lower body dressing      What is the patient wearing?: Incontinence brief, Pants     Lower body assist Assist for lower body dressing: 2 Helpers     Toileting Toileting    Toileting assist Assist for toileting: 2 Helpers     Transfers Chair/bed transfer  Transfers assist     Chair/bed transfer assist level: 2 Helpers     Locomotion Ambulation   Ambulation assist  Ambulation activity did not occur: Safety/medical concerns          Walk 10 feet activity   Assist  Walk 10 feet activity did not occur: Safety/medical concerns        Walk 50 feet  activity   Assist Walk 50 feet with 2 turns activity did not occur: Safety/medical concerns         Walk 150 feet activity   Assist Walk 150 feet activity did not occur: Safety/medical concerns         Walk 10 feet on uneven surface  activity   Assist Walk 10 feet on uneven surfaces activity did not occur: Safety/medical concerns         Wheelchair     Assist Will patient use wheelchair at discharge?: Yes Type of Wheelchair: Manual Wheelchair activity did not occur: Safety/medical concerns  Wheelchair assist level: Moderate Assistance - Patient 50 - 74% Max wheelchair distance: 15ft    Wheelchair 50 feet with 2 turns activity    Assist    Wheelchair 50 feet with 2 turns activity did not occur: Safety/medical concerns       Wheelchair 150 feet activity     Assist  Wheelchair 150 feet activity did not occur: Safety/medical concerns       Blood pressure (!) 138/58, pulse (!) 58, temperature 98.5 F (36.9 C), resp. rate 18, height 5\' 7"  (1.702 m), weight 104.8 kg, SpO2 98 %.  Medical Problem List and Plan: 1.Left hemiplegia with dysphagia/dysarthriasecondary to right watershed territory infarction in the setting of severe stenosis right M1 segment status post revascularization as well as small left lateral posterior temporal lobe infarct in right middle cerebellar peduncle infarct ELOS 10/28   - 2. Antithrombotics: -DVT/anticoagulation:Subcutaneous heparin -antiplatelet therapy: Aspirin 81 mg daily, Brilinta 90 mg twice daily 3. Pain Management:Tylenol as needed  -will add voltaren gel for R foot 4. Mood:Seroquel 12.5 mg nightly -antipsychotic agents: N/A 5. Neuropsych: This patientisintermittentlycapable of making decisions onherown behalf. 6. Skin/Wound Care:Routine skin checks 7. Fluids/Electrolytes/Nutrition:Routine in and outs with follow-up chemistries  Labs Monday 8.  Dysphagia. Dysphasia #3 nectar liquids currently  9. Hypertension. Maxide 37.5-25 mg daily, clonidine patch 0.1 mg change every 7 days, Zebeta 2.5 mg daily.   Vitals:   02/17/19 2036 02/18/19 0313  BP: (!) 121/54 (!) 138/58  Pulse: 65 (!) 58  Resp: 18 18  Temp: 98.3 F (36.8 C) 98.5 F (36.9 C)  SpO2: 95% 98%  Overall controlled 10/9 10. Hyperlipidemia. Lipitor 11. Hypothyroidism. Synthroid 12. R foot pain- Mid foot OA, no current complaints today     LOS: 7 days A FACE TO FACE EVALUATION WAS PERFORMED  Charlett Blake 02/18/2019, 8:59 AM

## 2019-02-18 NOTE — Progress Notes (Signed)
Occupational Therapy Weekly Progress Note  Patient Details  Name: Christy Carter MRN: [id number] Date of Birth: [birth year]  Beginning of progress report period: February 12, 2019 End of progress report period: February 18, 2019     Patient has met 4 of 4 short term goals.  Pt has made excellent progress this week with her functional mobility of sit to stand, standing and transfers; use of LUE as an active A; balance; attention to L side; and proficiency with self care.    Patient continues to demonstrate the following deficits: muscle weakness, decreased cardiorespiratoy endurance, unbalanced muscle activation and decreased coordination and decreased sitting balance, decreased standing balance, decreased postural control, hemiplegia and decreased balance strategies and therefore will continue to benefit from skilled OT intervention to enhance overall performance with BADL.  Patient progressing toward long term goals..  Continue plan of care.  OT Short Term Goals Week 1:  OT Short Term Goal 1 (Week 1): Pt will don shirt with mod A OT Short Term Goal 1 - Progress (Week 1): Met OT Short Term Goal 2 (Week 1): Pt will maintain static EOB sitting balance with min A while completing ADL task OT Short Term Goal 2 - Progress (Week 1): Met OT Short Term Goal 3 (Week 1): Pt will complete LB bathing bed level with LRAD with mod A OT Short Term Goal 3 - Progress (Week 1): Met OT Short Term Goal 4 (Week 1): Pt will require only mod cueing for attending to L side of body during UB bathing OT Short Term Goal 4 - Progress (Week 1): Met Week 2:  OT Short Term Goal 1 (Week 2): Pt will be able to rise to stand to RW with min A of 1 to prepare for LB self care. OT Short Term Goal 2 (Week 2): Pt will be able to stand with mod A for dynamic balance as she pulls pants over hips with min A. OT Short Term Goal 3 (Week 2): Min A squat or stand pivot to toilet. OT Short Term Goal 4 (Week 2): Pt will don shirt with min  A. OT Short Term Goal 5 (Week 2): Pt will don pants with min A.       Therapy Documentation Precautions:  Precautions Precautions: Fall, Other (comment) Precaution Comments: L inattention, left hemiparesis Restrictions Weight Bearing Restrictions: No  Davidmichael Zarazua 02/18/2019, 12:26 PM

## 2019-02-18 NOTE — Progress Notes (Signed)
Occupational Therapy Session Note  Patient Details  Name: Christy Carter MRN: [id number] Date of Birth: Oct 06, 1933  Today's Date: 02/18/2019 OT Individual Time: 7953-6922 OT Individual Time Calculation (min): 45 min    Short Term Goals: Week 1:  OT Short Term Goal 1 (Week 1): Pt will don shirt with mod A OT Short Term Goal 2 (Week 1): Pt will maintain static EOB sitting balance with min A while completing ADL task OT Short Term Goal 3 (Week 1): Pt will complete LB bathing bed level with LRAD with mod A OT Short Term Goal 4 (Week 1): Pt will require only mod cueing for attending to L side of body during UB bathing  Skilled Therapeutic Interventions/Progress Updates:   Pt received in bed ready for therapy.  After sitting to EOB worked on "warm up exercises" of B knee extension.       ADL Retraining:  Completed LB bathing from EOB with pt reaching to front perineal area and upper legs.  Full A to bathe bottom during standing with +2 A at a mod A level with RW for support.   Transfers: Actively sat to EOB using good core control with CGA,  sit to stand at RW with mod A and +2 for safety.  Squat pivot with mod A to w/c to her R side.   Balance:  Supervision with static sitting balance, CGA with dynamic reaching toward feet.   In standing, worked on foot positioning and wt shifting to the R with B hands on walker and +2 for support as pt needed min -mod A with standing with pulling up pants.    Neuromuscular Re-Education: Used LUE as an active A with bathing and dressing. Able to grasp waist band of pull ups and pants to pull pant up 50% of the way. Good attention to L side of body with no verbal cues needed.  Pt resting in w/c at end of session with belt alarm on and all needs met.   Therapy Documentation Precautions:  Precautions Precautions: Fall, Other (comment) Precaution Comments: L inattention, left hemiparesis Restrictions Weight Bearing Restrictions: No   Pain: Pain  Assessment Pain Scale: Faces Pain Score: Asleep Faces Pain Scale: Hurts little more Pain Type: Acute pain Pain Location: Head   Therapy/Group: Individual Therapy  Margretta Zamorano 02/18/2019, 8:27 AM

## 2019-02-18 NOTE — Progress Notes (Signed)
Speech Language Pathology Weekly Progress and Session Note  Patient Details  Name: Christy Carter MRN: [id number] Date of Birth: [birth year]  Beginning of progress report period: February 12, 2019 End of progress report period: February 18, 2019  Today's Date: 02/18/2019 SLP Individual Time: [id number] SLP Individual Time Calculation (min): 45 min  Short Term Goals: Week 1: SLP Short Term Goal 1 (Week 1): Pt will consume dys 2 textures and nectar thick liquids with supervision cues for use of swallowing precautions and minimal overt s/s of aspiration. SLP Short Term Goal 1 - Progress (Week 1): Progressing toward goal SLP Short Term Goal 2 (Week 1): Pt will consume therapeutic trials of thin liquids with supervision cues for use of swallowing precautions and minimal overt s/s of aspiration over 2 consecutive sessions prior to repeat instrumental assessment. SLP Short Term Goal 2 - Progress (Week 1): Progressing toward goal SLP Short Term Goal 3 (Week 1): Pt will locate items to the left of midline in >25% of opportunities during basic, familiar tasks with mod assist multimodal cues. SLP Short Term Goal 3 - Progress (Week 1): Progressing toward goal SLP Short Term Goal 4 (Week 1): Pt will complete basic, familiar tasks wtih mod assist multimodal cues for functional problem solving. SLP Short Term Goal 4 - Progress (Week 1): Progressing toward goal SLP Short Term Goal 5 (Week 1): Pt will utilize external aids to recall basic, daily information with mod assist verbal cues. SLP Short Term Goal 5 - Progress (Week 1): Progressing toward goal    New Short Term Goals: Week 2: SLP Short Term Goal 1 (Week 2): Pt will consume dys 2 textures and nectar thick liquids with supervision cues for use of swallowing precautions and minimal overt s/s of aspiration. SLP Short Term Goal 2 (Week 2): Pt will consume therapeutic trials of thin liquids with supervision cues for use of swallowing precautions and minimal  overt s/s of aspiration over 2 consecutive sessions prior to repeat instrumental assessment. SLP Short Term Goal 3 (Week 2): Pt will locate items to the left of midline in >25% of opportunities during basic, familiar tasks with mod assist multimodal cues. SLP Short Term Goal 4 (Week 2): Pt will complete basic, familiar tasks wtih mod assist multimodal cues for functional problem solving. SLP Short Term Goal 5 (Week 2): Pt will utilize external aids to recall basic, daily information with mod assist verbal cues. SLP Short Term Goal 6 (Week 2): Pt will sustain attention to basic familiar task for 15 minutes with Mod A cues for redirection to task.  Weekly Progress Updates: Pt has experienced several challenges this week including hallucinations and has missed an ST session for chest x-ray. As a result, her progress has been slower than expected. Therapy has targeted sustained attention to task, trials of thin liquids, scanning to left of midline and functional problem solving. Currently pt requires Max A cues with rare fade to Mod A. Therefore the above mentioned goals continue to be appropriate for this upcoming reporting period. Therapy to target consistent trials of thin liquids across 2 sessions with MBS after that. Education has been ongoing with pt's daughter.      Intensity: Minumum of 1-2 x/day, 30 to 90 minutes Frequency: 3 to 5 out of 7 days Duration/Length of Stay: 14-20 days Treatment/Interventions: Cognitive remediation/compensation;Cueing hierarchy;Functional tasks;Environmental controls;Dysphagia/aspiration precaution training;Internal/external aids;Patient/family education   Daily Session  Skilled Therapeutic Interventions: Skilled treatment session focused on dysphagia and cognition. SLP provided skilled observation of pt consuming  thin liquids via cup. Pt was free of overt s/s of aspiration, however she required Max A verbal cues to consume trials d/t deficits in sustained attention  to task. With Max A cues, pt able to locate cup to her left in 5 out of 10 opportunities. With Max A cues, pt able to utilize heavy verbal cues to recall information from previous session immediately prior to this session. Pt's daughter present and education is ongoing. Pt left upright in wheelchair, lap belt alarm on and all needs within reach. Continue per current plan of care.     General    Pain    Therapy/Group: Individual Therapy  Kita Neace 02/18/2019, 4:18 PM

## 2019-02-18 NOTE — Progress Notes (Signed)
Occupational Therapy Session Note  Patient Details  Name: Christy Carter MRN: [id number] Date of Birth: [birth year]  Today's Date: 02/18/2019 OT Individual Time: 1300-1345 OT Individual Time Calculation (min): 45 min    Short Term Goals: Week 1:  OT Short Term Goal 1 (Week 1): Pt will don shirt with mod A OT Short Term Goal 1 - Progress (Week 1): Met OT Short Term Goal 2 (Week 1): Pt will maintain static EOB sitting balance with min A while completing ADL task OT Short Term Goal 2 - Progress (Week 1): Met OT Short Term Goal 3 (Week 1): Pt will complete LB bathing bed level with LRAD with mod A OT Short Term Goal 3 - Progress (Week 1): Met OT Short Term Goal 4 (Week 1): Pt will require only mod cueing for attending to L side of body during UB bathing OT Short Term Goal 4 - Progress (Week 1): Met  Skilled Therapeutic Interventions/Progress Updates:    Pt in bed working on eating lunch to start the session.  She needed mod assist for transition to the right side of the bed with HOB remaining in elevation of 60 degrees.  Max assist for sit to stand from the edge of the bed and for stand pivot to the wheelchair.  She demonstrated decreased upright posture and decreased LLE strength during the transfers.  Took pt down to the day room for work on sit to stand and standing balance from the elevated EOM.  Max assist for stand pivot transfer to the mat from the wheelchair.  Had pt begin from elevated surface working on sit to stand with use of the RW for support.  She began needing mod assist from elevated surface but slowly improved all the way up to min assist level from a lower mat.  She maintained standing with mod assist initially, demonstrating increased left lean and decreased left knee and hip extension.  She was able to eventually take steps with the RW and max instructional cueing with max assist for approximately 5'.  Finished session with return to the room and pt left sitting up in the  wheelchair with call button and phone in reach and daughter present.     Therapy Documentation Precautions:  Precautions Precautions: Fall, Other (comment) Precaution Comments: L inattention, left hemiparesis Restrictions Weight Bearing Restrictions: No  Pain: Pain Assessment Pain Scale: Faces Pain Score: 0-No pain ADL: See Care Tool Section for some details of mobility   Therapy/Group: Individual Therapy  Siera Beyersdorf OTR/L 02/18/2019, 5:08 PM

## 2019-02-19 ENCOUNTER — Inpatient Hospital Stay (HOSPITAL_COMMUNITY): Payer: Medicare Other | Admitting: Occupational Therapy

## 2019-02-19 NOTE — Progress Notes (Signed)
Occupational Therapy Session Note  Patient Details  Name: Christy Carter MRN: [id number] Date of Birth: [birth year]  Today's Date: 02/19/2019 OT Individual Time: 0900-1000 OT Individual Time Calculation (min): 60 min    Short Term Goals: Week 2:  OT Short Term Goal 1 (Week 2): Pt will be able to rise to stand to RW with min A of 1 to prepare for LB self care. OT Short Term Goal 2 (Week 2): Pt will be able to stand with mod A for dynamic balance as she pulls pants over hips with min A. OT Short Term Goal 3 (Week 2): Min A squat or stand pivot to toilet. OT Short Term Goal 4 (Week 2): Pt will don shirt with min A. OT Short Term Goal 5 (Week 2): Pt will don pants with min A.  Skilled Therapeutic Interventions/Progress Updates:    Pt seen for ADL training to include a shower.  To doff clothing over LB pt used B hands on a grab bar to pull to stand and then she stood briefly as therapist pulled pants down. Positioned wc to allow pt to squat pivot onto tub bench in shower using B hands on bar.  This method was achieveable but not truly successful as she could not lift high enough to clear her hips and ended up sliding over.  On bench, bathed with min A and maintained sit balance.  She struggled even more to get out of tub so ended up using Stedy lift needing mod A to stand up.    Transferred to wc to dress.  +2 A to stand and stabilize her balance as pants were pulled over hips.  Overall good participation, but pt needs to develop more sit to stand strength.  Resting in room with belt alarm on and all needs met.   Therapy Documentation Precautions:  Precautions Precautions: Fall, Other (comment) Precaution Comments: L inattention, left hemiparesis Restrictions Weight Bearing Restrictions: No    Vital Signs: Therapy Vitals Temp: 98 F (36.7 C) Temp Source: Oral Pulse Rate: 63 BP: (!) 144/56 Patient Position (if appropriate): Lying Oxygen Therapy SpO2: 98 % O2 Device: Room  Air Pain: Pain Assessment Pain Score: 0-No pain   Therapy/Group: Individual Therapy  [place] 02/19/2019, 12:50 PM

## 2019-02-20 NOTE — Progress Notes (Signed)
[place] PHYSICAL MEDICINE & REHABILITATION PROGRESS NOTE   Subjective/Complaints:  No issues overnite, wants to drink coffee, we discussed nectar liquid restrictions but overall good prognosis for swallow recovery  No cough   ROS- denies SOB, CP, N/V/D.   Objective:   No results found. No results for input(s): WBC, HGB, HCT, PLT in the last 72 hours. No results for input(s): NA, K, CL, CO2, GLUCOSE, BUN, CREATININE, CALCIUM in the last 72 hours.  Intake/Output Summary (Last 24 hours) at 02/20/2019 0949 Last data filed at 02/20/2019 0750 Gross per 24 hour  Intake 540 ml  Output -  Net 540 ml     Physical Exam: Vital Signs Blood pressure (!) 145/82, pulse 63, temperature 98 F (36.7 C), temperature source Oral, resp. rate 16, height 5\' 7"  (1.702 m), weight 104.8 kg, SpO2 100 %. Vitals reviewed Constitutional:No distress. obese; sitting up in chair at bedside, alone; brighter affect, NAD HENT:  Head:Normocephalicand atraumatic.  Eyes:EOMare normal.  Neck:Normal range of motion.No tracheal deviationpresent. Cardiovascular:Normal rateand regular rhythm. Murmurheard. Respiratory:Effort normal. Norespiratory distress. She hasno wheezes. Unchanged  [id number]. She exhibitsno distension. There isno abdominal tenderness.  Musculoskeletal:  General: Edemapresent.  Neurological: Pt in WC . Still appears fatigued. RUE and RLE grossly 4- to 4/5. LUE and LLE 3-/5, mild left inattention. Speech dysarthric. Sensed gross touch, decreased pain left arm and leg.  Decreased finger to thumb opposition on the left side.  The patient has decreased rapid alternating movement of supination pronation in the left upper limb Skin: Skin iswarmand dry. A few scattered ecchymoses esp on arms B/L Psychiatric: No emotional lability or agitation   Ext trace pedal edema R>L     Assessment/Plan: 1. Functional deficits secondary to Left hemiplegia with  dysphagia/dysarthriasecondary to right watershed territory infarction in the setting of severe stenosis right M1 segment status post revascularization which require 3+ hours per day of interdisciplinary therapy in a comprehensive inpatient rehab setting.  Physiatrist is providing close team supervision and 24 hour management of active medical problems listed below.  Physiatrist and rehab team continue to assess barriers to discharge/monitor patient progress toward functional and medical goals  Care Tool:  Bathing  Bathing activity did not occur: Safety/medical concerns Body parts bathed by patient: Right arm, Left arm, Chest, Abdomen, Front perineal area, Right upper leg, Left upper leg, Face   Body parts bathed by helper: Right lower leg, Left lower leg     Bathing assist Assist Level: Moderate Assistance - Patient 50 - 74%     Upper Body Dressing/Undressing Upper body dressing   What is the patient wearing?: Bra, Pull over shirt    Upper body assist Assist Level: Minimal Assistance - Patient > 75%    Lower Body Dressing/Undressing Lower body dressing      What is the patient wearing?: Incontinence brief, Pants     Lower body assist Assist for lower body dressing: 2 Helpers     Toileting Toileting    Toileting assist Assist for toileting: 2 Helpers     Transfers Chair/bed transfer  Transfers assist     Chair/bed transfer assist level: 2 Helpers(squat pivot)     Locomotion Ambulation   Ambulation assist   Ambulation activity did not occur: Safety/medical concerns  Assist level: Maximal Assistance - Patient 25 - 49% Assistive device: Walker-rolling Max distance: 5'   Walk 10 feet activity   Assist  Walk 10 feet activity did not occur: Safety/medical concerns        Walk  50 feet activity   Assist Walk 50 feet with 2 turns activity did not occur: Safety/medical concerns         Walk 150 feet activity   Assist Walk 150 feet activity did  not occur: Safety/medical concerns         Walk 10 feet on uneven surface  activity   Assist Walk 10 feet on uneven surfaces activity did not occur: Safety/medical concerns         Wheelchair     Assist Will patient use wheelchair at discharge?: Yes Type of Wheelchair: Manual Wheelchair activity did not occur: Safety/medical concerns  Wheelchair assist level: Moderate Assistance - Patient 50 - 74% Max wheelchair distance: 20ft    Wheelchair 50 feet with 2 turns activity    Assist    Wheelchair 50 feet with 2 turns activity did not occur: Safety/medical concerns       Wheelchair 150 feet activity     Assist  Wheelchair 150 feet activity did not occur: Safety/medical concerns       Blood pressure (!) 145/82, pulse 63, temperature 98 F (36.7 C), temperature source Oral, resp. rate 16, height 5\' 7"  (1.702 m), weight 104.8 kg, SpO2 100 %.  Medical Problem List and Plan: 1.Left hemiplegia with dysphagia/dysarthriasecondary to right watershed territory infarction in the setting of severe stenosis right M1 segment status post revascularization as well as small left lateral posterior temporal lobe infarct in right middle cerebellar peduncle infarct ELOS 10/28   - 2. Antithrombotics: -DVT/anticoagulation:Subcutaneous heparin -antiplatelet therapy: Aspirin 81 mg daily, Brilinta 90 mg twice daily 3. Pain Management:Tylenol as needed  -will add voltaren gel for R foot 4. Mood:Seroquel 12.5 mg nightly -antipsychotic agents: N/A 5. Neuropsych: This patientisintermittentlycapable of making decisions onherown behalf. 6. Skin/Wound Care:Routine skin checks 7. Fluids/Electrolytes/Nutrition:Routine in and outs with follow-up chemistries  Labs Monday 8. Dysphagia. Dysphasia #3 nectar liquids currently- should be able to upgrade during this hospitalization  9. Hypertension. Maxide 37.5-25 mg  daily, clonidine patch 0.1 mg change every 7 days, Zebeta 2.5 mg daily.   Vitals:   02/19/19 1916 02/20/19 0453  BP: (!) 147/46 (!) 145/82  Pulse: 67 63  Resp: 16 16  Temp: 98 F (36.7 C) 98 F (36.7 C)  SpO2: 100% 100%  fair control 10/11 10. Hyperlipidemia. Lipitor 11. Hypothyroidism. Synthroid 12. R foot pain- Mid foot OA, no current complaints today     LOS: 9 days A FACE TO FACE EVALUATION WAS PERFORMED  Charlett Blake 02/20/2019, 9:49 AM

## 2019-02-21 ENCOUNTER — Inpatient Hospital Stay (HOSPITAL_COMMUNITY): Payer: Medicare Other

## 2019-02-21 ENCOUNTER — Inpatient Hospital Stay (HOSPITAL_COMMUNITY): Payer: Medicare Other | Admitting: Occupational Therapy

## 2019-02-21 DIAGNOSIS — D72829 Elevated white blood cell count, unspecified: Secondary | ICD-10-CM

## 2019-02-21 DIAGNOSIS — I69391 Dysphagia following cerebral infarction: Secondary | ICD-10-CM

## 2019-02-21 NOTE — Progress Notes (Signed)
Speech Language Pathology Daily Session Note  Patient Details  Name: Christy Carter MRN: [id number] Date of Birth: 03-Dec-1933  Today's Date: 02/21/2019 SLP Individual Time: 1302-1400 SLP Individual Time Calculation (min): 58 min  Short Term Goals: Week 2: SLP Short Term Goal 1 (Week 2): Pt will consume dys 2 textures and nectar thick liquids with supervision cues for use of swallowing precautions and minimal overt s/s of aspiration. SLP Short Term Goal 2 (Week 2): Pt will consume therapeutic trials of thin liquids with supervision cues for use of swallowing precautions and minimal overt s/s of aspiration over 2 consecutive sessions prior to repeat instrumental assessment. SLP Short Term Goal 3 (Week 2): Pt will locate items to the left of midline in >25% of opportunities during basic, familiar tasks with mod assist multimodal cues. SLP Short Term Goal 4 (Week 2): Pt will complete basic, familiar tasks wtih mod assist multimodal cues for functional problem solving. SLP Short Term Goal 5 (Week 2): Pt will utilize external aids to recall basic, daily information with mod assist verbal cues. SLP Short Term Goal 6 (Week 2): Pt will sustain attention to basic familiar task for 15 minutes with Mod A cues for redirection to task.  Skilled Therapeutic Interventions: Skilled ST services focused on cognitive and swallow skills. Pt required max A verbal/visual cues to problem solving turning on/off TV and adjusting volume. SLP facilitated PO consumption of dys 2 and NTL lunch tray, pt required min A verbal cues for swallow strategies to clear minimal left buccal pocketing/oral residue and to cease verbalization during mastication with no overt s/s aspiration. Pt required mod A verbal cues to scan left and navigate tray during meal, knocking over a cup of NTL. SLP also facilitated sustained attention, left scanning and basic problem solving skills in sort card sorting task by shapes in a field of 6, requiring max  A verbal cues for problem solving and mod A verbal cues for left scanning and in a field of 3 with mod A verbal cues for problem solving and supervision A verbal cues to scan left with various colors/numbers of shapes present on card Pt was left in room with daughter, call bell within reach and chair alarm set. ST recommends to continue skilled ST services.      Pain Pain Assessment Pain Score: 0-No pain  Therapy/Group: Individual Therapy  Marlayna Bannister  [facility] 02/21/2019, 3:17 PM

## 2019-02-21 NOTE — Progress Notes (Signed)
[place] PHYSICAL MEDICINE & REHABILITATION PROGRESS NOTE   Subjective/Complaints: Patient seen lying in bed this morning.  She states she slept well overnight.  She states she wants to know her DC date.  She states she would like coffee, but does not like the thickener.  ROS: Denies SOB, CP, N/V/D.   Objective:   No results found. No results for input(s): WBC, HGB, HCT, PLT in the last 72 hours. No results for input(s): NA, K, CL, CO2, GLUCOSE, BUN, CREATININE, CALCIUM in the last 72 hours.  Intake/Output Summary (Last 24 hours) at 02/21/2019 1336 Last data filed at 02/20/2019 1800 Gross per 24 hour  Intake 120 ml  Output -  Net 120 ml     Physical Exam: Vital Signs Blood pressure 132/60, pulse 62, temperature (!) 97.4 F (36.3 C), resp. rate 18, height 5\' 7"  (1.702 m), weight 104.8 kg, SpO2 100 %. Constitutional: No distress . Vital signs reviewed.  Obese. HENT: Normocephalic.  Atraumatic. Eyes: EOMI. No discharge. Cardiovascular: No JVD. Respiratory: Normal effort.  No stridor. GI: Non-distended. Skin: Warm and dry.  Intact. Psych: Normal mood.  Normal behavior. Musc: Lower extremity edema Neurological:Alert Motor: RUE/RLE 4-/5 proximal to distal  Dysarthria   Assessment/Plan: 1. Functional deficits secondary to Left hemiplegia with dysphagia/dysarthriasecondary to right watershed territory infarction in the setting of severe stenosis right M1 segment status post revascularization which require 3+ hours per day of interdisciplinary therapy in a comprehensive inpatient rehab setting.  Physiatrist is providing close team supervision and 24 hour management of active medical problems listed below.  Physiatrist and rehab team continue to assess barriers to discharge/monitor patient progress toward functional and medical goals  Care Tool:  Bathing  Bathing activity did not occur: Safety/medical concerns Body parts bathed by patient: Right arm, Left arm, Chest,  Abdomen, Front perineal area, Right upper leg, Left upper leg, Face, Buttocks   Body parts bathed by helper: Right lower leg, Left lower leg     Bathing assist Assist Level: Minimal Assistance - Patient > 75%     Upper Body Dressing/Undressing Upper body dressing   What is the patient wearing?: Bra, Pull over shirt    Upper body assist Assist Level: Minimal Assistance - Patient > 75%    Lower Body Dressing/Undressing Lower body dressing      What is the patient wearing?: Incontinence brief, Pants     Lower body assist Assist for lower body dressing: 2 Helpers     Toileting Toileting    Toileting assist Assist for toileting: Minimal Assistance - Patient > 75%(with use of stedy)     Transfers Chair/bed transfer  Transfers assist     Chair/bed transfer assist level: 2 Helpers(squat pivot)     Locomotion Ambulation   Ambulation assist   Ambulation activity did not occur: Safety/medical concerns  Assist level: Maximal Assistance - Patient 25 - 49% Assistive device: Walker-rolling Max distance: 5'   Walk 10 feet activity   Assist  Walk 10 feet activity did not occur: Safety/medical concerns        Walk 50 feet activity   Assist Walk 50 feet with 2 turns activity did not occur: Safety/medical concerns         Walk 150 feet activity   Assist Walk 150 feet activity did not occur: Safety/medical concerns         Walk 10 feet on uneven surface  activity   Assist Walk 10 feet on uneven surfaces activity did not occur: Safety/medical concerns  Wheelchair     Assist Will patient use wheelchair at discharge?: Yes Type of Wheelchair: Manual Wheelchair activity did not occur: Safety/medical concerns  Wheelchair assist level: Moderate Assistance - Patient 50 - 74% Max wheelchair distance: 69'    Wheelchair 50 feet with 2 turns activity    Assist    Wheelchair 50 feet with 2 turns activity did not occur: Safety/medical  concerns       Wheelchair 150 feet activity     Assist  Wheelchair 150 feet activity did not occur: Safety/medical concerns       Blood pressure 132/60, pulse 62, temperature (!) 97.4 F (36.3 C), resp. rate 18, height 5\' 7"  (1.702 m), weight 104.8 kg, SpO2 100 %.  Medical Problem List and Plan: 1.Left hemiplegia with dysphagia/dysarthriasecondary to right watershed territory infarction in the setting of severe stenosis right M1 segment status post revascularization as well as small left lateral posterior temporal lobe infarct in right middle cerebellar peduncle infarct  Continue CIR 2. Antithrombotics: -DVT/anticoagulation:Subcutaneous heparin -antiplatelet therapy: Aspirin 81 mg daily, Brilinta 90 mg twice daily 3. Pain Management:Tylenol as needed  -will add voltaren gel for R foot 4. Mood:Seroquel 12.5 mg nightly -antipsychotic agents: N/A 5. Neuropsych: She is?  Fully capable of making decisions onherown behalf. 6. Skin/Wound Care:Routine skin checks 7. Fluids/Electrolytes/Nutrition:Routine in and outs 8.  Post strokedysphagia.   D2 nectars  Advance diet as tolerated  9. Hypertension. Maxide 37.5-25 mg daily, clonidine patch 0.1 mg change every 7 days, Zebeta 2.5 mg daily.   Vitals:   02/21/19 0446 02/21/19 1305  BP: (!) 154/84 132/60  Pulse: 62 62  Resp: 16 18  Temp: 98 F (36.7 C) (!) 97.4 F (36.3 C)  SpO2: 96% 100%   Labile on 10/12 10. Hyperlipidemia. Lipitor 11. Hypothyroidism. Synthroid 12. R foot pain- Mid foot OA:?  Resolved 13.  Leukocytosis  WBC 10.7 on 10/5, labs ordered for tomorrow  Afebrile  Continue to monitor    LOS: 10 days A FACE TO FACE EVALUATION WAS PERFORMED  Christy Carter Christy Carter 02/21/2019, 1:36 PM

## 2019-02-21 NOTE — Progress Notes (Signed)
Occupational Therapy Session Note  Patient Details  Name: Christy Carter MRN: [id number] Date of Birth: 12/23/1933  Today's Date: 02/21/2019 OT Individual Time: 0830-0945 OT Individual Time Calculation (min): 75 min    Short Term Goals: Week 2:  OT Short Term Goal 1 (Week 2): Pt will be able to rise to stand to RW with min A of 1 to prepare for LB self care. OT Short Term Goal 2 (Week 2): Pt will be able to stand with mod A for dynamic balance as she pulls pants over hips with min A. OT Short Term Goal 3 (Week 2): Min A squat or stand pivot to toilet. OT Short Term Goal 4 (Week 2): Pt will don shirt with min A. OT Short Term Goal 5 (Week 2): Pt will don pants with min A.  Skilled Therapeutic Interventions/Progress Updates:    Pt received in bed ready for therapy.  ADL Retraining: Pt agreeable to a shower, but needed to toilet first.  Used stedy to transfer to toilet and then to tub bench. On toilet,  Needed A to pull pants down and up but could cleanse self after a bowel movement.   On tub bench, pt bathed self with min A reaching to her feet with CGA for balance.  She leaned to L to wash her bottom with R hand.   Pt dressed from w/c with min to don bra and shirt, mod for pants, and total with TEDs and socks. Brushed teeth at sink with set up and then drank about 2 oz of water with some intermittent coughing.  Transfers:  Sit to stand in stedy from elevated bed with S, from elevated toilet with S, from tub bench mod A and from w/c min A.  Held balance well in stedy.    Balance:  Standing with R hand on bed rail during LB dressing, mod A of 2 to rise to stand and maintain balance as pt was trying to pull up her pants.  Pt had a great deal of pain in R mid foot with weight bearing.  Pt feels it has gotten worse.  Called her daughter to ask her to bring in a pair of supportive tennis shoes.   Neuromuscular Re-Education:  Actively used LUE with all tasks today.  Pt resting in w/c with all  needs met. Belt alarm on.   Therapy Documentation Precautions:  Precautions Precautions: Fall, Other (comment) Precaution Comments: L inattention, left hemiparesis Restrictions Weight Bearing Restrictions: No  Pain: Pain Assessment Pain Scale: 0-10 Pain Score: 8  Pain Type: Acute pain Pain Location: Foot Pain Orientation: Right Pain Descriptors / Indicators: Aching Pain Onset: With Activity Pain Intervention(s): Rest  Therapy/Group: Individual Therapy  SAGUIER,JULIA 02/21/2019, 9:44 AM 
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
## 2019-02-21 NOTE — Progress Notes (Signed)
Physical Therapy Session Note  Patient Details  Name: Christy Carter MRN: [id number] Date of Birth: [birth year]  Today's Date: 02/21/2019 PT Individual Time: 1115-1200 PT Individual Time Calculation (min): 45 min   Short Term Goals: Week 2:  PT Short Term Goal 1 (Week 2): Pt wil perform supine<>sit with min assist PT Short Term Goal 2 (Week 2): Pt will perform sit<>stand using LRAD with min assist, from chair height PT Short Term Goal 3 (Week 2): Pt will perform bed<>chair transfer using LRAD with mod assist of 1 PT Short Term Goal 4 (Week 2): Pt will ambulate at least 36ft with max assist using LRAD  Skilled Therapeutic Interventions/Progress Updates:   Blocked practice sit <> stands with RW from w/c (mod assist overall) with focus on technique and hand placement. Requires mod cues for recall of correct hand placement and sequencing. Facilitation needed for anterior weightshift. Initially pt with poor ability to reach full upright posture but improved with repetition and able to achieve upright trunk extension. Requires tactile cues and assist for attention to L hand placement and positioning on RW once in standing. Several attempts during each standing bout to engage in marching or attempt to progress to initiating gait. Pt difficulty with weightshfiting off of LLE to attempt to raise or step with LLE, tendency for posterior and L lateral bias. Pt with sudden descents to the w/c as fatigued or she states "I forgot what I was doing!"  NMR on kinetron in seated position from w/c for reciprocal movement pattern retraining and functional BLE strengthening to aid with mobility x 4 cycles of ~ 1 min each time. Cues for full push/activation through LLE. Added theraband to L rim of w/c in attempt for improved grip and attention to the L hand during w/c propulsion. Still with minimal success due to impaired attention to use LUE functionally during w/c propulsion. Required mod assist x 15' with max  cues.  Pt hallucinating at times during session stating she sees a cat in the corner or another inanimate object not present. Reoriented throughout session as able.    Therapy Documentation Precautions:  Precautions Precautions: Fall, Other (comment) Precaution Comments: L inattention, left hemiparesis Restrictions Weight Bearing Restrictions: No   Pain: Reports premedicated for pain in R foot.   Therapy/Group: Individual Therapy  Canary Brim Ivory Broad, PT, DPT, CBIS  02/21/2019, 12:04 PM

## 2019-02-22 ENCOUNTER — Inpatient Hospital Stay (HOSPITAL_COMMUNITY): Payer: Medicare Other

## 2019-02-22 ENCOUNTER — Inpatient Hospital Stay (HOSPITAL_COMMUNITY): Payer: Medicare Other | Admitting: Speech Pathology

## 2019-02-22 ENCOUNTER — Inpatient Hospital Stay (HOSPITAL_COMMUNITY): Payer: Medicare Other | Admitting: Occupational Therapy

## 2019-02-22 DIAGNOSIS — N179 Acute kidney failure, unspecified: Secondary | ICD-10-CM

## 2019-02-22 LAB — CBC WITH DIFFERENTIAL/PLATELET
Abs Immature Granulocytes: 0.16 10*3/uL — ABNORMAL HIGH (ref 0.00–0.07)
Basophils Absolute: 0.1 10*3/uL (ref 0.0–0.1)
Basophils Relative: 2 %
Eosinophils Absolute: 0.5 10*3/uL (ref 0.0–0.5)
Eosinophils Relative: 7 %
HCT: 45.1 % (ref 36.0–46.0)
Hemoglobin: 14.4 g/dL (ref 12.0–15.0)
Immature Granulocytes: 2 %
Lymphocytes Relative: 17 %
Lymphs Abs: 1.3 10*3/uL (ref 0.7–4.0)
MCH: 29.8 pg (ref 26.0–34.0)
MCHC: 31.9 g/dL (ref 30.0–36.0)
MCV: 93.2 fL (ref 80.0–100.0)
Monocytes Absolute: 0.8 10*3/uL (ref 0.1–1.0)
Monocytes Relative: 10 %
Neutro Abs: 4.8 10*3/uL (ref 1.7–7.7)
Neutrophils Relative %: 62 %
Platelets: 339 10*3/uL (ref 150–400)
RBC: 4.84 MIL/uL (ref 3.87–5.11)
RDW: 15.9 % — ABNORMAL HIGH (ref 11.5–15.5)
WBC: 7.7 10*3/uL (ref 4.0–10.5)
nRBC: 0 % (ref 0.0–0.2)

## 2019-02-22 LAB — URINALYSIS, ROUTINE W REFLEX MICROSCOPIC
Bilirubin Urine: NEGATIVE
Glucose, UA: NEGATIVE mg/dL
Hgb urine dipstick: NEGATIVE
Ketones, ur: NEGATIVE mg/dL
Leukocytes,Ua: NEGATIVE
Nitrite: NEGATIVE
Protein, ur: NEGATIVE mg/dL
Specific Gravity, Urine: 1.014 (ref 1.005–1.030)
pH: 5 (ref 5.0–8.0)

## 2019-02-22 LAB — BASIC METABOLIC PANEL
Anion gap: 13 (ref 5–15)
BUN: 44 mg/dL — ABNORMAL HIGH (ref 8–23)
CO2: 24 mmol/L (ref 22–32)
Calcium: 9.9 mg/dL (ref 8.9–10.3)
Chloride: 103 mmol/L (ref 98–111)
Creatinine, Ser: 2.17 mg/dL — ABNORMAL HIGH (ref 0.44–1.00)
GFR calc Af Amer: 24 mL/min — ABNORMAL LOW (ref >60)
GFR calc non Af Amer: 20 mL/min — ABNORMAL LOW (ref >60)
Glucose, Bld: 135 mg/dL — ABNORMAL HIGH (ref 70–99)
Potassium: 4.7 mmol/L (ref 3.5–5.1)
Sodium: 140 mmol/L (ref 135–145)

## 2019-02-22 MED ORDER — SODIUM CHLORIDE 0.9 % IV SOLN
INTRAVENOUS | Status: DC
  Administered 2019-02-22 – 2019-03-03 (×18): via INTRAVENOUS

## 2019-02-22 NOTE — Progress Notes (Signed)
[facility] PHYSICAL MEDICINE & REHABILITATION PROGRESS NOTE   Subjective/Complaints: Patient seen sitting up in her chair this morning about work with therapies.  She slept fairly overnight per nursing.  She appears more confused this morning, discussed with therapies and nursing as well.  ROS: Denies SOB, CP, N/V/D.   Objective:   No results found. Recent Labs    02/22/19 0633  WBC 7.7  HGB 14.4  HCT 45.1  PLT 339   Recent Labs    02/22/19 0956  NA 140  K 4.7  CL 103  CO2 24  GLUCOSE 135*  BUN 44*  CREATININE 2.17*  CALCIUM 9.9    Intake/Output Summary (Last 24 hours) at 02/22/2019 1144 Last data filed at 02/21/2019 1700 Gross per 24 hour  Intake 160 ml  Output -  Net 160 ml     Physical Exam: Vital Signs Blood pressure (!) 138/57, pulse 62, temperature 97.7 F (36.5 C), resp. rate 17, height 5\' 7"  (1.702 m), weight 104.8 kg, SpO2 98 %. Constitutional: No distress . Vital signs reviewed.  Obese. HENT: Normocephalic.  Atraumatic. Eyes: EOMI. No discharge. Cardiovascular: No JVD. Respiratory: Normal effort.  No stridor. GI: Non-distended. Skin: Warm and dry.  Intact. Psych: Normal mood.  Normal behavior. Musc: Lower extremity edema Neurological:Alert and oriented to person and time, but not to place despite cues and reeducation Motor: LUE/LLE: 4/5 proximal to distal  RUE/RLE: 5/5 proximal distal Dysarthria  Assessment/Plan: 1. Functional deficits secondary to Left hemiplegia with dysphagia/dysarthriasecondary to right watershed territory infarction in the setting of severe stenosis right M1 segment status post revascularization which require 3+ hours per day of interdisciplinary therapy in a comprehensive inpatient rehab setting.  Physiatrist is providing close team supervision and 24 hour management of active medical problems listed below.  Physiatrist and rehab team continue to assess barriers to discharge/monitor patient progress toward functional  and medical goals  Care Tool:  Bathing  Bathing activity did not occur: Safety/medical concerns Body parts bathed by patient: Right arm, Left arm, Chest, Abdomen, Front perineal area, Right upper leg, Left upper leg, Face, Buttocks   Body parts bathed by helper: Right lower leg, Left lower leg     Bathing assist Assist Level: Minimal Assistance - Patient > 75%     Upper Body Dressing/Undressing Upper body dressing   What is the patient wearing?: Bra, Pull over shirt    Upper body assist Assist Level: Minimal Assistance - Patient > 75%    Lower Body Dressing/Undressing Lower body dressing      What is the patient wearing?: Incontinence brief, Pants     Lower body assist Assist for lower body dressing: 2 Helpers     Toileting Toileting    Toileting assist Assist for toileting: Minimal Assistance - Patient > 75%(with use of stedy)     Transfers Chair/bed transfer  Transfers assist     Chair/bed transfer assist level: 2 Helpers(squat pivot)     Locomotion Ambulation   Ambulation assist   Ambulation activity did not occur: Safety/medical concerns  Assist level: Maximal Assistance - Patient 25 - 49% Assistive device: Walker-rolling Max distance: 5'   Walk 10 feet activity   Assist  Walk 10 feet activity did not occur: Safety/medical concerns        Walk 50 feet activity   Assist Walk 50 feet with 2 turns activity did not occur: Safety/medical concerns         Walk 150 feet activity   Assist Walk 150  feet activity did not occur: Safety/medical concerns         Walk 10 feet on uneven surface  activity   Assist Walk 10 feet on uneven surfaces activity did not occur: Safety/medical concerns         Wheelchair     Assist Will patient use wheelchair at discharge?: Yes Type of Wheelchair: Manual Wheelchair activity did not occur: Safety/medical concerns  Wheelchair assist level: Moderate Assistance - Patient 50 - 74% Max  wheelchair distance: 54'    Wheelchair 50 feet with 2 turns activity    Assist    Wheelchair 50 feet with 2 turns activity did not occur: Safety/medical concerns       Wheelchair 150 feet activity     Assist  Wheelchair 150 feet activity did not occur: Safety/medical concerns       Blood pressure (!) 138/57, pulse 62, temperature 97.7 F (36.5 C), resp. rate 17, height 5\' 7"  (1.702 m), weight 104.8 kg, SpO2 98 %.  Medical Problem List and Plan: 1.Left hemiplegia with dysphagia/dysarthriasecondary to right watershed territory infarction in the setting of severe stenosis right M1 segment status post revascularization as well as small left lateral posterior temporal lobe infarct in right middle cerebellar peduncle infarct  Continue CIR 2. Antithrombotics: -DVT/anticoagulation:Subcutaneous heparin -antiplatelet therapy: Aspirin 81 mg daily, Brilinta 90 mg twice daily 3. Pain Management:Tylenol as needed  -will add voltaren gel for R foot 4. Mood:Seroquel 12.5 mg nightly -antipsychotic agents: N/A 5. Neuropsych: She is not fully capable of making decisions onherown behalf. 6. Skin/Wound Care:Routine skin checks 7. Fluids/Electrolytes/Nutrition:Routine in and outs 8.  Post strokedysphagia.   D2 nectars  Encourage fluids  Advance diet as tolerated  9. Hypertension.   Maxide 37.5-25 mg daily DC'd on 10/14 due to AKI  Continue clonidine patch 0.1 mg change every 7 days, Zebeta 2.5 mg daily.   Vitals:   02/21/19 1956 02/22/19 0415  BP: 123/65 (!) 138/57  Pulse: 60 62  Resp: 18 17  Temp: 97.6 F (36.4 C) 97.7 F (36.5 C)  SpO2: 100% 98%   Relatively controlled on 10/13 10. Hyperlipidemia. Lipitor 11. Hypothyroidism. Synthroid 12. R foot pain- Mid foot OA:?  Resolved 13.  Leukocytosis  WBC 7.7 on 10/13  Afebrile  Continue to monitor 14.  AKI  Creatinine 2.17 on 10/13   Echo reviewed-EF 65-70% with  hyperdynamic left ventricle  Continuous IVF initiated, will plan to transition to nightly tomorrow   LOS: 11 days A FACE TO FACE EVALUATION WAS PERFORMED  Christy Carter Christy Carter 02/22/2019, 11:44 AM

## 2019-02-22 NOTE — Progress Notes (Signed)
Occupational Therapy Session Note  Patient Details  Name: Christy Carter MRN: [id number] Date of Birth: 1933/09/11  Today's Date: 02/22/2019 OT Individual Time: 6759-1638 OT Individual Time Calculation (min): 75 min    Short Term Goals: Week 2:  OT Short Term Goal 1 (Week 2): Pt will be able to rise to stand to RW with min A of 1 to prepare for LB self care. OT Short Term Goal 2 (Week 2): Pt will be able to stand with mod A for dynamic balance as she pulls pants over hips with min A. OT Short Term Goal 3 (Week 2): Min A squat or stand pivot to toilet. OT Short Term Goal 4 (Week 2): Pt will don shirt with min A. OT Short Term Goal 5 (Week 2): Pt will don pants with min A.  Skilled Therapeutic Interventions/Progress Updates:    Pt received in w/c crying and distraught.  She was recalling events of last night and was very confused about what happened and where she was.  She thought the doctor was supposed to get her dressed for the day.  We spoke with her for 10 minutes and tried to reorient her but she was still confused which is not her norm.  Reported to PA and RN her change in status.  After getting pt started with washing up face and UB at sink, pt started to relax more and demonstrated much less confusion.   ADL Retraining:  Because pt was upset today, she did not want to shower but agreed to wash up. Pt placed at sink and she completed oral care and UB bathing with set up and then dressing with mod A.  Used stedy, to transfer her to toilet.  She needed more A with toileting today due to decreased strength in standing.   Transfers:  Mod A of 2 to pull to stand in stedy from w/c and from toilet seat, which she only needed Min A yesterday.  Pt stated, "I am so much weaker today."   Balance:  Mod A to support balance in standing with stedy today.  Pt was calmer at end of session and was oriented.  Pt resting in w/c with belt alarm on and all needs met.   Therapy Documentation Precautions:   Precautions Precautions: Fall, Other (comment) Precaution Comments: L inattention, left hemiparesis Restrictions Weight Bearing Restrictions: No  Pain: Pain Assessment Pain Score: 0-No pain       Therapy/Group: Individual Therapy  [place] 02/22/2019, 12:12 PM

## 2019-02-22 NOTE — Progress Notes (Signed)
Speech Language Pathology Daily Session Note  Patient Details  Name: Christy Carter MRN: [id number] Date of Birth: January 21, 1934  Today's Date: 02/22/2019 SLP Individual Time: [id number] SLP Individual Time Calculation (min): 62 min  Short Term Goals: Week 2: SLP Short Term Goal 1 (Week 2): Pt will consume dys 2 textures and nectar thick liquids with supervision cues for use of swallowing precautions and minimal overt s/s of aspiration. SLP Short Term Goal 2 (Week 2): Pt will consume therapeutic trials of thin liquids with supervision cues for use of swallowing precautions and minimal overt s/s of aspiration over 2 consecutive sessions prior to repeat instrumental assessment. SLP Short Term Goal 3 (Week 2): Pt will locate items to the left of midline in >25% of opportunities during basic, familiar tasks with mod assist multimodal cues. SLP Short Term Goal 4 (Week 2): Pt will complete basic, familiar tasks wtih mod assist multimodal cues for functional problem solving. SLP Short Term Goal 5 (Week 2): Pt will utilize external aids to recall basic, daily information with mod assist verbal cues. SLP Short Term Goal 6 (Week 2): Pt will sustain attention to basic familiar task for 15 minutes with Mod A cues for redirection to task.   Skilled Therapeutic Interventions: Pt was seen for skilled ST targeting dysphagia and cognitive goals. Following thorough completion of oral care with suction prior to any solid PO intake (in accordance with water protocol), SLP facilitated session with upgraded thin H2O trials. Pt exhibited 1 immediate cough with cup sip throughout consumption of ~4oz of water. Following trials, pt refused nectar thick liquids and all breakfast items except for yogurt, which she consumed with Min A verbal cues for use of swallow strategies for left sided oral clearance. Pt was with fluctuating levels of confusion throughout session, as well as lability regarding stroke dx (stated, "why didn't the  lord just take me home") and she was perseverative on going home and whether family members knew where she was. Cognitive interventions focused on assisting pt with orientation to place and situation - although she stated she was in [place] several times, she believed herself to be at a hospital in [facility] and reported "being in a different place last night." SLP posted external aid in room to assist with orientation throughout the day. Max A verbal and visual cues provided for pt to orient to month and day on a calendar, and Mod A verbal/visual cues required for pt to count days until discharge date. Pt reported she thought she was supposed to d/c on 10/18, however anticipated d/c is 10/28 - SLP provided rationale behind importance of participation in therapies for next 2 week to maximize functional independence and safety at home. She was with intermittent awareness of confusion, and state that things "just aren't connecting all the time." Prior to end of session, pt demonstrated carryover of how to use call bell to call nurse for assistance, however Total A required to recall how to work TV. Pt was left sitting in chair with seatbelt alarm engaged and all needs within reach. Continue per current plan of care.      Pain Pain Assessment Pain Scale: 0-10 Pain Score: 0-No pain  Therapy/Group: Individual Therapy  [name] 02/22/2019, 9:37 AM

## 2019-02-22 NOTE — Progress Notes (Signed)
Physical Therapy Session Note  Patient Details  Name: Christy Carter MRN: [id number] Date of Birth: 1933-05-30  Today's Date: 02/22/2019 PT Individual Time: 1404-1500 PT Individual Time Calculation (min): 56 min   Short Term Goals: Week 2:  PT Short Term Goal 1 (Week 2): Pt wil perform supine<>sit with min assist PT Short Term Goal 2 (Week 2): Pt will perform sit<>stand using LRAD with min assist, from chair height PT Short Term Goal 3 (Week 2): Pt will perform bed<>chair transfer using LRAD with mod assist of 1 PT Short Term Goal 4 (Week 2): Pt will ambulate at least 59ft with max assist using LRAD  Skilled Therapeutic Interventions/Progress Updates:    Pt sitting on edge of w/c upon PT arrival, reaching for the bed. Pt requesting to get in bed, reports she is tired. Therapist encouraged pt to participate and assured pt that this therapist will help her to bed at end of session, pt agreeable to therapy tx and denies pain. Therapist assisted pt to scoot hips back in w/c with mod assist, cues for techniques and anterior weightshift. Therapist donned shoes total assist for time management. Pt transported to the gym, performed slideboard transfer to the mat with mod assist. Pt worked on LE weightbearing and postural control to perform horseshoe toss while seated in hi-perch position edge of mat, x 1 trial with CGA. Pt performed x 2 sit<>stands from elevated mat without AD with mod assist, while in standing worked on Programmer, systems, cues for upright posture, facilitation for hip/trunk extension. Pt performed x 3 sit<>stands throughout session from elevated mat (24 in) with RW and min assist. In standing pt worked on pre-gait stepping in place with each LE x 5 using RW for UE support, cues for upright posture and foot placement. Pt ambulated x 11 ft and x 10 ft this session with RW and mod assist +2 for w/c follow for safety, cues for upright posture and foot placement throughout. Pt needed max  assist this session for sit<>stand from w/c height, cues for techniques. Pt transported back to room and transferred to bed stand pivot with RW and mod assist, sit>supine with min assist and left with needs in reach and chair alarm set.   Vitals checked after activity: BP 160/57, HR 65 bpm  Therapy Documentation Precautions:  Precautions Precautions: Fall, Other (comment) Precaution Comments: L inattention, left hemiparesis Restrictions Weight Bearing Restrictions: No    Therapy/Group: Individual Therapy   Netta Corrigan, PT, DPT, CSRS 02/22/19  2:28 PM    [place] 02/22/2019, 9:08 AM

## 2019-02-23 ENCOUNTER — Inpatient Hospital Stay (HOSPITAL_COMMUNITY): Payer: Medicare Other | Admitting: Occupational Therapy

## 2019-02-23 ENCOUNTER — Inpatient Hospital Stay (HOSPITAL_COMMUNITY): Payer: Medicare Other | Admitting: Speech Pathology

## 2019-02-23 ENCOUNTER — Inpatient Hospital Stay (HOSPITAL_COMMUNITY): Payer: Medicare Other

## 2019-02-23 ENCOUNTER — Other Ambulatory Visit: Payer: Self-pay

## 2019-02-23 DIAGNOSIS — G9340 Encephalopathy, unspecified: Secondary | ICD-10-CM

## 2019-02-23 DIAGNOSIS — R739 Hyperglycemia, unspecified: Secondary | ICD-10-CM

## 2019-02-23 LAB — BASIC METABOLIC PANEL
Anion gap: 12 (ref 5–15)
BUN: 43 mg/dL — ABNORMAL HIGH (ref 8–23)
CO2: 22 mmol/L (ref 22–32)
Calcium: 9.2 mg/dL (ref 8.9–10.3)
Chloride: 107 mmol/L (ref 98–111)
Creatinine, Ser: 1.84 mg/dL — ABNORMAL HIGH (ref 0.44–1.00)
GFR calc Af Amer: 29 mL/min — ABNORMAL LOW (ref >60)
GFR calc non Af Amer: 25 mL/min — ABNORMAL LOW (ref >60)
Glucose, Bld: 110 mg/dL — ABNORMAL HIGH (ref 70–99)
Potassium: 4.3 mmol/L (ref 3.5–5.1)
Sodium: 141 mmol/L (ref 135–145)

## 2019-02-23 NOTE — Progress Notes (Signed)
Physical Therapy Session Note  Patient Details  Name: CAROLEA BROCHU MRN: [id number] Date of Birth: 10-Apr-1934  Today's Date: 02/23/2019 PT Individual Time: 1303-1400 PT Individual Time Calculation (min): 57 min  Short Term Goals: Week 2:  PT Short Term Goal 1 (Week 2): Pt wil perform supine<>sit with min assist PT Short Term Goal 2 (Week 2): Pt will perform sit<>stand using LRAD with min assist, from chair height PT Short Term Goal 3 (Week 2): Pt will perform bed<>chair transfer using LRAD with mod assist of 1 PT Short Term Goal 4 (Week 2): Pt will ambulate at least 72ft with max assist using LRAD  Skilled Therapeutic Interventions/Progress Updates:    Received pt sitting EOB eating lunch with RN present, PT took over with supervision for eating. Pt agreeable to PT and denied any pain during today's session. Pt continues to remain confused. Session focused on functional mobility/transfers, bed mobility, LE strengthening and stretching, RW safety, and improved tolerance to activity. PT donned shoes for time management purposes, pt dependent. Pt performed 3 stand pivot transfers with RW mod A throughout today's session. Pt requires verbal cues for hand placement on RW, to extend LLE, foot placement when turning, and to stand tall. Pt performed sit<>stands x 3 with RW mod A from elevated mat and required verbal cues for hand placement, foot placement, and to stand upright. Pt performed bed mobility consisting of sit<>supine, rolling L/R, and supine<>sit min A for LE management and verbal cues for technique and use of RUE for support. Pt performed supine bridges 2x5 with verbal/tactile cues to emphasize hip extension. In L and R sidelying, pt performed hip extension 1x10 with min A and half rolling onto a bolster 1x10 min A with verbal/tactile cues to look direction of roll and to turn shoulders in direction of roll. PT performed passive R/L hip flexor stretch in sidelying 2x30 second hold with verbal  cues to relax LE. Attempted contract/relax stretching but pt unable to follow. During rolling activities pt stated "I'm drunk!" referring to feeling dizzy with rolling and stated that she felt like the room was spinning. No nystagmus seen in eyes. Concluded session with pt sitting in WC, needs within reach, seatbelt alarm on, and daughter present in room.   Therapy Documentation Precautions:  Precautions Precautions: Fall, Other (comment) Precaution Comments: L inattention, left hemiparesis Restrictions Weight Bearing Restrictions: No   Therapy/Group: Individual Therapy  Drema Dallas Schagen  Becky Sax PT, DPT   Netta Corrigan, PT, DPT, CSRS 02/23/19  4:06 PM   02/23/2019, 4:06 PM

## 2019-02-23 NOTE — Patient Care Conference (Signed)
Inpatient RehabilitationTeam Conference and Plan of Care Update Date: 02/23/2019   Time: 10:45 AM    Patient Name: Christy Carter      Medical Record Number: [id number]  Date of Birth: 1934/01/01 Sex: Female         Room/Bed: 4W26C/4W26C-01 Payor Info: Payor: [employer] MEDICARE / Plan: [employer] MEDICARE / Product Type: *No Product type* /    Admit Date/Time:  02/11/2019  2:15 PM  Primary Diagnosis:  Acute bilat watershed infarction [facility])  Patient Active Problem List   Diagnosis Date Noted  . Hyperglycemia   . Encephalopathy   . AKI (acute kidney injury) ([place])   . Leukocytosis   . Dysphagia, post-stroke   . Acute bilat watershed infarction [facility]) 02/11/2019  . Agitation requiring sedation protocol 02/09/2019  . Dysphagia 02/09/2019  . Stroke (cerebrum) ([place]) - R watershed d/t large vessel dz & small L temp lobe & R cerebellar infarcts 02/05/2019  . Middle cerebral artery stenosis, right 02/05/2019  . Acute respiratory failure ([place]) 02/03/2019  . Acute respiratory failure with hypoxemia ([place]) 02/03/2019  . Abnormal renal function 01/19/2019  . Pain in left foot 12/19/2018  . Hematuria 08/02/2018  . Influenza A 03/02/2018  . Urinary tract infection without hematuria 03/02/2018  . Sepsis ([place]) 02/11/2018  . CAP (community acquired pneumonia) 02/11/2018  . CKD (chronic kidney disease), stage III 02/11/2018  . Memory loss, short term 02/10/2018  . Flu vaccine need 02/10/2018  . Chest pain 02/03/2018  . Acute upper respiratory infection 02/03/2018  . Gastroesophageal reflux disease without esophagitis 02/03/2018  . Encounter for general adult medical examination with abnormal findings 01/13/2018  . Chronic low back pain 01/13/2018  . Need for vaccination against Streptococcus pneumoniae using pneumococcal conjugate vaccine 7 01/13/2018  . Dysuria 01/13/2018  . 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 Pelvic pain 10/08/2017  . Gout 06/15/2017  . Mixed  hyperlipidemia 06/15/2017  . Inflammatory polyarthropathy ([place]) 06/15/2017  . Neuralgia and neuritis, unspecified 06/15/2017  . Major depressive disorder, recurrent, mild ([place]) 06/15/2017  . Other vitamin B12 deficiency anemias 06/15/2017  . Hypothyroidism 10/17/2008  . OVERWEIGHT/OBESITY 10/17/2008  . SLEEP APNEA, OBSTRUCTIVE 10/17/2008  . Essential hypertension 10/17/2008  . GALLSTONES 10/17/2008  . DYSPNEA 10/17/2008  . Obesity 10/17/2008    Expected Discharge Date: Expected Discharge Date: 03/09/19  Team Members Present: Physician leading conference: Dr. Delice Lesch Social Worker Present: Ovidio Kin, LCSW Nurse Present: Dorthula Nettles, RN PT Present: Drema Dallas Shagen, Mirna Mires, PT OT Present: Clyda Greener, OT SLP Present: Stormy Fabian, SLP PPS Coordinator present : Gunnar Fusi, SLP     Current Status/Progress Goal Weekly Team Focus  Bowel/Bladder   pt is incontinent of b/b at times, urinalysis pending, LBM 02/22/19  time toileting, transfer to [facility] to properly void  assess toileting q shift and prn   Swallow/Nutrition/ Hydration   dys 2, NTL, trials thin  supervision A  water protocol, swallow strategies, repeat MBS Thurs 10/13?   ADL's   Pt has been progressing well and improving to a min -mod A level with standing, toileting, LB self care, transfers, but on 02/22/19 had much more difficulty and initially demonstrated quite a bit of confusion that did start to improve by end of the session.  min stand balance, bathing, LB dressing, toileting and toilet transfers  ADL training, LE strength and balance for standing skills, LUE strengthening, pt/family education   Mobility   max assist bed mobility, mod assist stand pivot transfer  with RW, mod assist +2 for w/c follow gait up to 10 ft with RW  min assist  transfers, bed mobility, gait, attention, balance, education, endurance   Communication             Safety/Cognition/ Behavioral Observations  Mod-Max A,  increased confusion/disorientation on 10/12  Min A  orientation, basic problem solving, sustained attention, error awareness and recall with aid   Pain   pt has not c/o pain during shift, OA in right foot, has voltaren gel ordered  pain less than 5  assess pain q shift and prn   Skin   abrasion on flank and groin. ecchymosis on abdomen, legs, chest, and arms  prevent further skin breakdown  assess skin q shift and prn      *See Care Plan and progress notes for long and short-term goals.     Barriers to Discharge  Current Status/Progress Possible Resolutions Date Resolved   Nursing                  PT                    OT                  SLP                SW                Discharge Planning/Teaching Needs:  Going to daughter's home now due to her's is being renovated. Daughter aware pt will require 24 hr physical care and will need family education prior to DC home.      Team Discussion:  Progressing toward her goals of min assist level. Currently min-mod level. AKI needed IV fluids got pulled out last night-now infusing. MBS tomorrow see if can upgrade diet to thin liquids. Endurance issues. Daughter to come in next week to begin learning her care.  Revisions to Treatment Plan:  DC 10/28    Medical Summary Current Status: Left hemiplegia with dysphagia/dysarthria secondary to right watershed territory infarction in the setting of severe stenosis right M1 segment status post revascularization as well as small left lateral posterior temporal lobe infarct in right middle cerebellar peduncle infarct, now with encephalopathy Weekly Focus/Goal: Improve mobility, cognition, dysphagia, HTN, AKI, hyperglycemia  Barriers to Discharge: Medical stability   Possible Resolutions to Barriers: Therapies, advance diet as tolerated, cont IVF, follow labs, optimize BP meds   Continued Need for Acute Rehabilitation Level of Care: The patient requires daily medical management by a physician  with specialized training in physical medicine and rehabilitation for the following reasons: Direction of a multidisciplinary physical rehabilitation program to maximize functional independence : Yes Medical management of patient stability for increased activity during participation in an intensive rehabilitation regime.: Yes Analysis of laboratory values and/or radiology reports with any subsequent need for medication adjustment and/or medical intervention. : Yes   I attest that I was present, lead the team conference, and concur with the assessment and plan of the team. Team conference was held via web/ teleconference due to [place], Gardiner Rhyme 02/23/2019, 2:03 PM

## 2019-02-23 NOTE — Progress Notes (Signed)
Pt was found with all of her clothes off thrown on the floor, pts IV pulled out with IV fluids running In the floor. Pt continued to apologize " for messing up". Pt was dressed and resting in bed. Waiting for IV to come place IV due to difficult stick. Will continue to monitor.

## 2019-02-23 NOTE — Progress Notes (Addendum)
Speech Language Pathology Daily Session Note  Patient Details  Name: Christy Carter MRN: [id number] Date of Birth: [birth year]  Today's Date: 02/23/2019 SLP Individual Time: 1401-1500 SLP Individual Time Calculation (min): 59 min  Short Term Goals: Week 2: SLP Short Term Goal 1 (Week 2): Pt will consume dys 2 textures and nectar thick liquids with supervision cues for use of swallowing precautions and minimal overt s/s of aspiration. SLP Short Term Goal 2 (Week 2): Pt will consume therapeutic trials of thin liquids with supervision cues for use of swallowing precautions and minimal overt s/s of aspiration over 2 consecutive sessions prior to repeat instrumental assessment. SLP Short Term Goal 3 (Week 2): Pt will locate items to the left of midline in >25% of opportunities during basic, familiar tasks with mod assist multimodal cues. SLP Short Term Goal 4 (Week 2): Pt will complete basic, familiar tasks wtih mod assist multimodal cues for functional problem solving. SLP Short Term Goal 5 (Week 2): Pt will utilize external aids to recall basic, daily information with mod assist verbal cues. SLP Short Term Goal 6 (Week 2): Pt will sustain attention to basic familiar task for 15 minutes with Mod A cues for redirection to task.  Skilled Therapeutic Interventions: Pt was seen for skilled ST targeting dysphagia and cognition. Pt's daughter was present at bedside throughout session. Pt completed thorough oral care with tooth brush as sink with set up assist and Min A verbal cues for sequencing. During oral care, Min solid PO residue noted to expectorate with oral care secretions, therefore pt may not be clearing oral cavity efficiently during meals. SLP then provided with thin liquid trials in accordance with water protocol (it had been at least 40 minutes since she last ate). Pt consumed ~5oz thin water from cup with no overt s/s aspiration and use slow rate, small sips with only 1 verbal reminder. Following  thin liquid trials, pt refused to eat any more of her Dys 2 lunch or consume any nectar thick liquids. SLP facilitated session with Mod A verbal and visual cues for interpreting written instructions to use remote to control TV. Reading glasses donned over pt's regular Rx glasses aided in reading ability - left in pt's room inside glasses holder on tray to use throughout stay. Pt was oriented to self and place, Min A provided for pt to orient to month and date on a calendar. Mod A verbal cues required for pt to recall significance of 2 importance dates marked on her calendar. SLP further facilitated session with Mod A verbal and visual cues for error awareness and basic problem solving when matching colored blocks to colored pattern board (pattern contained 6 total colors). Pt was left sitting in wheelchair with seatbelt alarm engaged and daughter still present. Continue per current plan of care.       Pain Pain Assessment Pain Score: 0-No pain  Therapy/Group: Individual Therapy  [name] 02/23/2019, 3:13 PM

## 2019-02-23 NOTE — Progress Notes (Signed)
Social Work Patient ID: Renford Dills, female   DOB: 05/15/1933, 85 y.o.   MRN: [id number]  Met with pt and daughter who was here to discuss team conference progress toward her goals of min assist and discharge still 10/28. Pt has no memory of last night and pulling out IV. She is feeling better and hopeful her swallow test will go well tomorrow. She hates the nectar thick liquid. Daughter aware will need to come in next week to learn mom's care in preparation for discharge. Will work on discharge needs.

## 2019-02-23 NOTE — Progress Notes (Addendum)
[place] PHYSICAL MEDICINE & REHABILITATION PROGRESS NOTE   Subjective/Complaints: Patient seen lying in bed this morning.  Per report overnight, patient pulled out her clothes as well as the IV.  Patient remains confused, but appears to be more aware of her confusion this morning.  ROS: ?Reliability, but denies SOB, CP, N/V/D.   Objective:   No results found. Recent Labs    02/22/19 0633  WBC 7.7  HGB 14.4  HCT 45.1  PLT 339   Recent Labs    02/22/19 0956  NA 140  K 4.7  CL 103  CO2 24  GLUCOSE 135*  BUN 44*  CREATININE 2.17*  CALCIUM 9.9    Intake/Output Summary (Last 24 hours) at 02/23/2019 1027 Last data filed at 02/23/2019 0900 Gross per 24 hour  Intake 1286.23 ml  Output -  Net 1286.23 ml     Physical Exam: Vital Signs Blood pressure (!) 124/52, pulse 61, temperature 97.9 F (36.6 C), temperature source Oral, resp. rate 16, height 5\' 7"  (1.702 m), weight 104.8 kg, SpO2 97 %. Constitutional: No distress . Vital signs reviewed.  Morbidly obese. HENT: Normocephalic.  Atraumatic. Eyes: EOMI. No discharge. Cardiovascular: No JVD. Respiratory: Normal effort.  No stridor. GI: Non-distended. Skin: Warm and dry.  Intact. Psych: Normal mood.  Normal behavior. Musc: Lower extremity edema stable Neurological:Alert and oriented with increased time and cues, able to self-correct to an extent, but remains confused  Motor: LUE/LLE: 4/5 proximal to distal  RUE/RLE: 5/5 proximal distal, stable Dysarthria, stable  Assessment/Plan: 1. Functional deficits secondary to Left hemiparesis with dysphagia/dysarthriasecondary to right watershed territory infarction in the setting of severe stenosis right M1 segment status post revascularization which require 3+ hours per day of interdisciplinary therapy in a comprehensive inpatient rehab setting.  Physiatrist is providing close team supervision and 24 hour management of active medical problems listed below.  Physiatrist  and rehab team continue to assess barriers to discharge/monitor patient progress toward functional and medical goals  Care Tool:  Bathing  Bathing activity did not occur: Safety/medical concerns Body parts bathed by patient: Right arm, Left arm, Chest, Abdomen(UB only)   Body parts bathed by helper: Right lower leg, Left lower leg     Bathing assist Assist Level: Minimal Assistance - Patient > 75%     Upper Body Dressing/Undressing Upper body dressing   What is the patient wearing?: Bra, Pull over shirt    Upper body assist Assist Level: Moderate Assistance - Patient 50 - 74%    Lower Body Dressing/Undressing Lower body dressing      What is the patient wearing?: Incontinence brief, Pants     Lower body assist Assist for lower body dressing: Maximal Assistance - Patient 25 - 49%     Toileting Toileting    Toileting assist Assist for toileting: Maximal Assistance - Patient 25 - 49%     Transfers Chair/bed transfer  Transfers assist     Chair/bed transfer assist level: Moderate Assistance - Patient 50 - 74%     Locomotion Ambulation   Ambulation assist   Ambulation activity did not occur: Safety/medical concerns  Assist level: 2 helpers Assistive device: Walker-rolling Max distance: 11 ft   Walk 10 feet activity   Assist  Walk 10 feet activity did not occur: Safety/medical concerns  Assist level: 2 helpers Assistive device: Walker-rolling   Walk 50 feet activity   Assist Walk 50 feet with 2 turns activity did not occur: Safety/medical concerns  Walk 150 feet activity   Assist Walk 150 feet activity did not occur: Safety/medical concerns         Walk 10 feet on uneven surface  activity   Assist Walk 10 feet on uneven surfaces activity did not occur: Safety/medical concerns         Wheelchair     Assist Will patient use wheelchair at discharge?: Yes Type of Wheelchair: Manual Wheelchair activity did not occur:  Safety/medical concerns  Wheelchair assist level: Moderate Assistance - Patient 50 - 74% Max wheelchair distance: 15'    Wheelchair 50 feet with 2 turns activity    Assist    Wheelchair 50 feet with 2 turns activity did not occur: Safety/medical concerns       Wheelchair 150 feet activity     Assist  Wheelchair 150 feet activity did not occur: Safety/medical concerns       Blood pressure (!) 124/52, pulse 61, temperature 97.9 F (36.6 C), temperature source Oral, resp. rate 16, height 5\' 7"  (1.702 m), weight 104.8 kg, SpO2 97 %.  Medical Problem List and Plan: 1.Left hemiplegia with dysphagia/dysarthriasecondary to right watershed territory infarction in the setting of severe stenosis right M1 segment status post revascularization as well as small left lateral posterior temporal lobe infarct in right middle cerebellar peduncle infarct  Continue CIR  Team conference today to discuss current and goals and coordination of care, home and environmental barriers, and discharge planning with nursing, case manager, and therapies.  2. Antithrombotics: -DVT/anticoagulation:Subcutaneous heparin -antiplatelet therapy: Aspirin 81 mg daily, Brilinta 90 mg twice daily 3. Pain Management:Tylenol as needed  Added voltaren gel for R foot 4. Mood:Seroquel 12.5 mg nightly -antipsychotic agents: N/A 5. Neuropsych: She is not fully capable of making decisions onherown behalf. 6. Skin/Wound Care:Routine skin checks 7. Fluids/Electrolytes/Nutrition:Routine in and outs 8.  Post strokedysphagia.   D2 nectars  Encourage fluids  Advance diet as tolerated  9. Hypertension.   Maxide 37.5-25 mg daily DC'd on 10/14 due to AKI  Continue clonidine patch 0.1 mg change every 7 days, Zebeta 2.5 mg daily.   Vitals:   02/22/19 1920 02/23/19 0421  BP: (!) 139/56 (!) 124/52  Pulse: 64 61  Resp: 18 16  Temp: 97.6 F (36.4 C) 97.9 F (36.6 C)  SpO2:  96% 97%   Relatively controlled on 10/14 10. Hyperlipidemia. Lipitor 11. Hypothyroidism. Synthroid 12. R foot pain- Mid foot OA:?  Resolved 13.  Leukocytosis  WBC 7.7 on 10/13  Afebrile  Continue to monitor 14.  AKI  Creatinine 2.17 on 10/13, labs pending, labs ordered for tomorrow as well  Echo reviewed-EF 65-70% with hyperdynamic left ventricle  Continuous IVF initiated, cont given that IV was pulled overnight for unknown period of time  UA unremarkable, Ucx pending 15.  Hyperglycemia  Elevated on 10/13, continue to monitor  16. Encephalopathy  See #14  LOS: 12 days A FACE TO FACE EVALUATION WAS PERFORMED   [name] 02/23/2019, 10:27 AM

## 2019-02-23 NOTE — Progress Notes (Signed)
Occupational Therapy Session Note  Patient Details  Name: Christy Carter MRN: [id number] Date of Birth: Jul 01, 1933  Today's Date: 02/23/2019 OT Individual Time: 3903-0092 OT Individual Time Calculation (min): 75 min    Short Term Goals: Week 2:  OT Short Term Goal 1 (Week 2): Pt will be able to rise to stand to RW with min A of 1 to prepare for LB self care. OT Short Term Goal 2 (Week 2): Pt will be able to stand with mod A for dynamic balance as she pulls pants over hips with min A. OT Short Term Goal 3 (Week 2): Min A squat or stand pivot to toilet. OT Short Term Goal 4 (Week 2): Pt will don shirt with min A. OT Short Term Goal 5 (Week 2): Pt will don pants with min A.  Skilled Therapeutic Interventions/Progress Updates:    Pt received in bed with improved cognition from yesterday but she did think she was in someone's house.  With mod cues, able to identify she was at [facility].   Pt in bed with IV pump in place with dress already on.  Pt worked on eating with occasional cues for smaller bites and to check the L side of her mouth. With oatmeal bowl placed on her L side she thought she had finished it, moved bowl to her R side and she saw there was still some left.  She washed face and worked on rolling in bed using rails with min a and mod cues to use the rails.  Donned pants on pt as she worked on rolling.   Pt declined OOB activity at this time due to feeling weak and fatigued.  Focused on controlled movement of LUE with A/arom of tricep extension with overhead reach.  Active shoulder flexion by touching a target.  Used sound tubes, one in each hand to tap together to work on prontation/ supination and wrist extension and flexion.  Mod cues to fully attend to L UE as her gaze would shift to her R.  She can reach hand overhead and use in functional tasks as an active A.  She has difficulty with [facility] and isolated movement patterns.    Pt resting in bed with all needs met.   Therapy  Documentation Precautions:  Precautions Precautions: Fall, Other (comment) Precaution Comments: L inattention, left hemiparesis Restrictions Weight Bearing Restrictions: No  Pain: Pain Assessment Pain Scale: 0-10 Pain Score: 0-No pain   Therapy/Group: Individual Therapy  [place] 02/23/2019, 8:55 AM

## 2019-02-24 ENCOUNTER — Inpatient Hospital Stay (HOSPITAL_COMMUNITY): Payer: Medicare Other | Admitting: Speech Pathology

## 2019-02-24 ENCOUNTER — Inpatient Hospital Stay (HOSPITAL_COMMUNITY): Payer: Medicare Other | Admitting: Occupational Therapy

## 2019-02-24 ENCOUNTER — Inpatient Hospital Stay (HOSPITAL_COMMUNITY): Payer: Medicare Other

## 2019-02-24 DIAGNOSIS — G9341 Metabolic encephalopathy: Secondary | ICD-10-CM

## 2019-02-24 LAB — BASIC METABOLIC PANEL
Anion gap: 8 (ref 5–15)
BUN: 39 mg/dL — ABNORMAL HIGH (ref 8–23)
CO2: 24 mmol/L (ref 22–32)
Calcium: 8.9 mg/dL (ref 8.9–10.3)
Chloride: 111 mmol/L (ref 98–111)
Creatinine, Ser: 1.67 mg/dL — ABNORMAL HIGH (ref 0.44–1.00)
GFR calc Af Amer: 32 mL/min — ABNORMAL LOW (ref >60)
GFR calc non Af Amer: 28 mL/min — ABNORMAL LOW (ref >60)
Glucose, Bld: 94 mg/dL (ref 70–99)
Potassium: 4.1 mmol/L (ref 3.5–5.1)
Sodium: 143 mmol/L (ref 135–145)

## 2019-02-24 NOTE — Progress Notes (Signed)
[place] PHYSICAL MEDICINE & REHABILITATION PROGRESS NOTE   Subjective/Complaints: Patient seen sitting up in bed working with therapy this morning.  She states he slept well overnight.  Confusion is improving.  She is apologetic about her behavior and participation yesterday.  ROS: Denies SOB, CP, N/V/D.   Objective:   No results found. Recent Labs    02/22/19 0633  WBC 7.7  HGB 14.4  HCT 45.1  PLT 339   Recent Labs    02/23/19 1203 02/24/19 0532  NA 141 143  K 4.3 4.1  CL 107 111  CO2 22 24  GLUCOSE 110* 94  BUN 43* 39*  CREATININE 1.84* 1.67*  CALCIUM 9.2 8.9    Intake/Output Summary (Last 24 hours) at 02/24/2019 1144 Last data filed at 02/24/2019 0900 Gross per 24 hour  Intake 972.37 ml  Output -  Net 972.37 ml     Physical Exam: Vital Signs Blood pressure (!) 122/57, pulse (!) 56, temperature 97.7 F (36.5 C), temperature source Oral, resp. rate 16, height 5\' 7"  (1.702 m), weight 104.8 kg, SpO2 100 %. Constitutional: No distress . Vital signs reviewed.  Morbidly obese. HENT: Normocephalic.  Atraumatic. Eyes: EOMI. No discharge. Cardiovascular: No JVD. Respiratory: Normal effort.  No stridor. GI: Non-distended. Skin: Warm and dry.  Intact. Psych: Normal mood.  Normal behavior. Musc: Mild lower extremity edema. Neurological:Alert and oriented x3 with increased time Motor: LUE/LLE: 4/5 proximal to distal, improving RUE/RLE: 5/5 proximal distal, unchanged  Assessment/Plan: 1. Functional deficits secondary to Left hemiparesis with dysphagia/dysarthriasecondary to right watershed territory infarction in the setting of severe stenosis right M1 segment status post revascularization which require 3+ hours per day of interdisciplinary therapy in a comprehensive inpatient rehab setting.  Physiatrist is providing close team supervision and 24 hour management of active medical problems listed below.  Physiatrist and rehab team continue to assess barriers  to discharge/monitor patient progress toward functional and medical goals  Care Tool:  Bathing  Bathing activity did not occur: Safety/medical concerns Body parts bathed by patient: Right arm, Left arm, Chest, Abdomen(UB only)   Body parts bathed by helper: Right lower leg, Left lower leg     Bathing assist Assist Level: Minimal Assistance - Patient > 75%     Upper Body Dressing/Undressing Upper body dressing   What is the patient wearing?: Bra, Pull over shirt    Upper body assist Assist Level: Moderate Assistance - Patient 50 - 74%    Lower Body Dressing/Undressing Lower body dressing      What is the patient wearing?: Incontinence brief, Pants     Lower body assist Assist for lower body dressing: Maximal Assistance - Patient 25 - 49%     Toileting Toileting    Toileting assist Assist for toileting: Maximal Assistance - Patient 25 - 49%     Transfers Chair/bed transfer  Transfers assist     Chair/bed transfer assist level: Minimal Assistance - Patient > 75%(squat pivot)     Locomotion Ambulation   Ambulation assist   Ambulation activity did not occur: Safety/medical concerns  Assist level: 2 helpers Assistive device: Walker-rolling Max distance: 11 ft   Walk 10 feet activity   Assist  Walk 10 feet activity did not occur: Safety/medical concerns  Assist level: 2 helpers Assistive device: Walker-rolling   Walk 50 feet activity   Assist Walk 50 feet with 2 turns activity did not occur: Safety/medical concerns         Walk 150 feet activity  Assist Walk 150 feet activity did not occur: Safety/medical concerns         Walk 10 feet on uneven surface  activity   Assist Walk 10 feet on uneven surfaces activity did not occur: Safety/medical concerns         Wheelchair     Assist Will patient use wheelchair at discharge?: Yes Type of Wheelchair: Manual Wheelchair activity did not occur: Safety/medical concerns  Wheelchair  assist level: Moderate Assistance - Patient 50 - 74% Max wheelchair distance: 27'    Wheelchair 50 feet with 2 turns activity    Assist    Wheelchair 50 feet with 2 turns activity did not occur: Safety/medical concerns       Wheelchair 150 feet activity     Assist  Wheelchair 150 feet activity did not occur: Safety/medical concerns       Blood pressure (!) 122/57, pulse (!) 56, temperature 97.7 F (36.5 C), temperature source Oral, resp. rate 16, height 5\' 7"  (1.702 m), weight 104.8 kg, SpO2 100 %.  Medical Problem List and Plan: 1.Left hemiplegia with dysphagia/dysarthriasecondary to right watershed territory infarction in the setting of severe stenosis right M1 segment status post revascularization as well as small left lateral posterior temporal lobe infarct in right middle cerebellar peduncle infarct  Continue CIR 2. Antithrombotics: -DVT/anticoagulation:Subcutaneous heparin -antiplatelet therapy: Aspirin 81 mg daily, Brilinta 90 mg twice daily 3. Pain Management:Tylenol as needed  Added voltaren gel for R foot 4. Mood:Seroquel 12.5 mg nightly -antipsychotic agents: N/A 5. Neuropsych: She is not fully capable of making decisions onherown behalf. 6. Skin/Wound Care:Routine skin checks 7. Fluids/Electrolytes/Nutrition:Routine in and outs 8. Post strokedysphagia.   D2 nectars, plan for MBS tomorrow  Encourage fluids again  Advance diet as tolerated  9. Hypertension.   Maxide 37.5-25 mg daily DC'd on 10/14 due to AKI  Continue clonidine patch 0.1 mg change every 7 days, Zebeta 2.5 mg daily.   Vitals:   02/23/19 2016 02/24/19 0456  BP: (!) 121/40 (!) 122/57  Pulse: (!) 59 (!) 56  Resp: 18 16  Temp: 98.5 F (36.9 C) 97.7 F (36.5 C)  SpO2: 96% 100%   Controlled on 10/15 10. Hyperlipidemia. Lipitor 11. Hypothyroidism. Synthroid 12. R foot pain- Mid foot OA:?  Resolved 13.  Leukocytosis  WBC 7.7 on  10/13  Afebrile  Continue to monitor 14.  AKI  Creatinine 1.67 on 10/15, labs ordered for tomorrow l  Echo reviewed-EF 65-70% with hyperdynamic left ventricle  IVF changed to nightly on 10/15  UA unremarkable, Ucx pending on 10/15 15.  Hyperglycemia  Improving on 10/15 16.  Metabolic encephalopathy  See #14  Improving  LOS: 13 days A FACE TO FACE EVALUATION WAS PERFORMED  Ankit [name] 02/24/2019, 11:44 AM

## 2019-02-24 NOTE — Plan of Care (Signed)
  Problem: Consults Goal: RH STROKE PATIENT EDUCATION Description: See Patient Education module for education specifics  Outcome: Progressing   Problem: RH BOWEL ELIMINATION Goal: RH STG MANAGE BOWEL WITH ASSISTANCE Description: STG Manage Bowel with min Assistance. Outcome: Progressing   Problem: RH BLADDER ELIMINATION Goal: RH STG MANAGE BLADDER WITH ASSISTANCE Description: STG Manage Bladder With min  Assistance Outcome: Progressing   Problem: RH SAFETY Goal: RH STG ADHERE TO SAFETY PRECAUTIONS W/ASSISTANCE/DEVICE Description: STG Adhere to Safety Precautions With Cues and reminders. Outcome: Progressing   Problem: RH KNOWLEDGE DEFICIT Goal: RH STG INCREASE KNOWLEDGE OF HYPERTENSION Description: Daughter will be able to demonstrate management of htn using handouts and reading resources for medications , bp monitoring and dietary independently. Outcome: Progressing Goal: RH STG INCREASE KNOWLEDGE OF DYSPHAGIA/FLUID INTAKE Description: Daughter will be able to demonstrate management of dysphagia using handouts and reading resources for medications dietary restrictions independently. Outcome: Progressing Goal: RH STG INCREASE KNOWLEGDE OF HYPERLIPIDEMIA Description: Daughter will be able to demonstrate management of hld using handouts and reading resources for medications and dietary independently. Outcome: Progressing Goal: RH STG INCREASE KNOWLEDGE OF STROKE PROPHYLAXIS Description: Daughter will be able to demonstrate management of stroke using handouts and reading resources for medications , bp monitoring and dietary restrictions independently. Outcome: Progressing

## 2019-02-24 NOTE — Progress Notes (Signed)
Physical Therapy Session Note  Patient Details  Name: Christy Carter MRN: [id number] Date of Birth: 01/20/1934  Today's Date: 02/24/2019 PT Individual Time: [id number] PT Individual Time Calculation (min): 72 min   Short Term Goals: Week 2:  PT Short Term Goal 1 (Week 2): Pt wil perform supine<>sit with min assist PT Short Term Goal 2 (Week 2): Pt will perform sit<>stand using LRAD with min assist, from chair height PT Short Term Goal 3 (Week 2): Pt will perform bed<>chair transfer using LRAD with mod assist of 1 PT Short Term Goal 4 (Week 2): Pt will ambulate at least 31ft with max assist using LRAD  Skilled Therapeutic Interventions/Progress Updates:    Pt received in therapy gym seated in w/c. Pt performed squat pivot to mat with mod assist, cues for techniques and hand placement. Pt performed sit<>stand from mat with RW and mod assist, in standing pt performed stepping in place x 5 per LE for dynamic balance and pre-gait, facilitation for increased hip extension. Pt performed x 5 sit<>stands from mat with therapist sitting in front, no AD, facilitating hip/trunk extension. Pt worked on dynamic standing balance and upright posture to perform overhead reaching activity with horseshoes x 2 trials, min assist without UE support. Pt transferred to supine with min assist, in supine perform bridges 2 x 10 (activates glutes but does not fully clear hips from the mat). Therapist straightened legs in supine for hip flexor stretch, pt reports intense stretch 2 x 30 sec per LE. Pt worked on rolling in each direction this session x 2 with cues for pelvic and trunk activation, pt denies dizziness this session. In sidelying pt performed 2 x 10 hip abduction clamshells per side for strengthening. In sidelying performed pelvic PNF pattern on both sides x 10. Pt transferred sidelying>sitting this session with min assist. Upon sitting pt does report dizziness. Therapist decided to perform dix-hallpike maneuver to  assess for BPPV, pt brought back into position to test L side, pt with report of dizziness and nystagmus noted. Therapist took pt through canalith repositioning maneuver for treatment of L side. Pt may benefit from continued vestibular assessment.  Pt ambulated 2 x 10 ft this session with RW and mod assist, pt with continued flexed posture, cues for upright posture and increased step length. Pt transferred to w/c and transported to dayroom. Pt used kinetron 3 x 2 min bouts while seated in w/c working on hip extensor strengthening, resistance level 20 cm/sec. Pt transported back to room and transferred to bed mod assist stand pivot with RW. Sit>supine with min assist and left with needs in reach and bed alarm set.    Therapy Documentation Precautions:  Precautions Precautions: Fall, Other (comment) Precaution Comments: L inattention, left hemiparesis Restrictions Weight Bearing Restrictions: No    Therapy/Group: Individual Therapy  Netta Corrigan, PT, DPT, CSRS 02/24/2019, 10:59 AM

## 2019-02-24 NOTE — Progress Notes (Signed)
Speech Language Pathology Daily Session Note  Patient Details  Name: Christy Carter MRN: [id number] Date of Birth: 11-21-1933  Today's Date: 02/24/2019 SLP Individual Time: 0730-0830 SLP Individual Time Calculation (min): 60 min  Short Term Goals: Week 2: SLP Short Term Goal 1 (Week 2): Pt will consume dys 2 textures and nectar thick liquids with supervision cues for use of swallowing precautions and minimal overt s/s of aspiration. SLP Short Term Goal 2 (Week 2): Pt will consume therapeutic trials of thin liquids with supervision cues for use of swallowing precautions and minimal overt s/s of aspiration over 2 consecutive sessions prior to repeat instrumental assessment. SLP Short Term Goal 3 (Week 2): Pt will locate items to the left of midline in >25% of opportunities during basic, familiar tasks with mod assist multimodal cues. SLP Short Term Goal 4 (Week 2): Pt will complete basic, familiar tasks wtih mod assist multimodal cues for functional problem solving. SLP Short Term Goal 5 (Week 2): Pt will utilize external aids to recall basic, daily information with mod assist verbal cues. SLP Short Term Goal 6 (Week 2): Pt will sustain attention to basic familiar task for 15 minutes with Mod A cues for redirection to task.  Skilled Therapeutic Interventions: Pt was seen for skilled ST targeting dysphagia and cognitive goals. Pt was oriented to self, situation, place, and year independently this morning, Min A provided for use of external aid to identify today's date. SLP facilitated session with skilled observation of pt consuming current dys 2 diet breakfast tray and nectar thick liquids. No overt s/s aspiration were observed throughout intake, Min A verbal cues required for efficient use of lingual sweep and liquid wash to clear left buccal pocketing of solids. Also of note, pt consumed 8oz nectar thick apple juice (she has previously refused all nectar intake with this SLP). She has been dehydrated  and strongly dislikes nectar liquids, however reported she can tolerate taste of cold nectar apple juice. Repeat MBS to reassess pharyngeal swallow function scheduled for tomorrow (10/16). Pt continued to be internally distracted and labile regarding her stroke and current deficits. Emotional support provided, SLP and MD also provided encouragement to continue participation in therapies and review progress at end of each day. Once structured tasks were initiated, pt sustained her attention for ~15 minutes without need for redirection. During other functional tasks, pt required Mod A verbal cues for redirection. SLP further facilitated session with Min A for locating items left visual field, Min A for sequencing, and Mod A for awareness of 2 errors during a basic 3-step action card sequencing task.      Pain Pain Assessment Pain Scale: 0-10 Pain Score: 0-No pain  Therapy/Group: Individual Therapy  [name] 02/24/2019, 9:43 AM

## 2019-02-24 NOTE — Progress Notes (Signed)
Occupational Therapy Session Note  Patient Details  Name: Christy Carter MRN: [id number] Date of Birth: 12/02/1933  Today's Date: 02/24/2019 OT Individual Time: 0177-9390 OT Individual Time Calculation (min): 75 min    Short Term Goals: Week 2:  OT Short Term Goal 1 (Week 2): Pt will be able to rise to stand to RW with min A of 1 to prepare for LB self care. OT Short Term Goal 2 (Week 2): Pt will be able to stand with mod A for dynamic balance as she pulls pants over hips with min A. OT Short Term Goal 3 (Week 2): Min A squat or stand pivot to toilet. OT Short Term Goal 4 (Week 2): Pt will don shirt with min A. OT Short Term Goal 5 (Week 2): Pt will don pants with min A.  Skilled Therapeutic Interventions/Progress Updates:    Pt received in bed ready for therapy.  She demonstrated a great deal of improvement with energy, focus, and endurance today.  She was able to roll to her L, push up to sit with CGA.  Sit to stand to RW with min A.  Squat pivot to w/c (did not try with RW ) with CGA.  Worked on B/ D at sink level today with improved L side motor control and L side attention.  Min A overall with UB and min -mod A LB.  Pt was able to tolerate standing for 4 minutes at the sink to bathe perineal area and buttocks.  Pulled underwear and pants  Up with mod A.    Pt resting in wc with all needs met and belt alarm on. Call light in reach.   Therapy Documentation Precautions:  Precautions Precautions: Fall, Other (comment) Precaution Comments: L inattention, left hemiparesis Restrictions Weight Bearing Restrictions: No  Pain: Pain Assessment Pain Scale: 0-10 Pain Score: 0-No pain Faces Pain Scale: No hurt   Therapy/Group: Individual Therapy  [place] 02/24/2019, 10:07 AM

## 2019-02-25 ENCOUNTER — Inpatient Hospital Stay (HOSPITAL_COMMUNITY): Payer: Medicare Other | Admitting: Physical Therapy

## 2019-02-25 ENCOUNTER — Inpatient Hospital Stay (HOSPITAL_COMMUNITY): Payer: Medicare Other | Admitting: Occupational Therapy

## 2019-02-25 ENCOUNTER — Inpatient Hospital Stay (HOSPITAL_COMMUNITY): Payer: Medicare Other

## 2019-02-25 ENCOUNTER — Encounter (HOSPITAL_COMMUNITY): Payer: Medicare Other | Admitting: Speech Pathology

## 2019-02-25 DIAGNOSIS — R0989 Other specified symptoms and signs involving the circulatory and respiratory systems: Secondary | ICD-10-CM

## 2019-02-25 LAB — BASIC METABOLIC PANEL
Anion gap: 8 (ref 5–15)
BUN: 27 mg/dL — ABNORMAL HIGH (ref 8–23)
CO2: 24 mmol/L (ref 22–32)
Calcium: 8.9 mg/dL (ref 8.9–10.3)
Chloride: 109 mmol/L (ref 98–111)
Creatinine, Ser: 1.31 mg/dL — ABNORMAL HIGH (ref 0.44–1.00)
GFR calc Af Amer: 44 mL/min — ABNORMAL LOW (ref >60)
GFR calc non Af Amer: 38 mL/min — ABNORMAL LOW (ref >60)
Glucose, Bld: 91 mg/dL (ref 70–99)
Potassium: 4.1 mmol/L (ref 3.5–5.1)
Sodium: 141 mmol/L (ref 135–145)

## 2019-02-25 LAB — URINE CULTURE: Culture: >=100000 — AB

## 2019-02-25 MED ORDER — AMPICILLIN 500 MG PO CAPS
500.0000 mg | ORAL_CAPSULE | Freq: Three times a day (TID) | ORAL | Status: DC
  Administered 2019-02-25 – 2019-03-04 (×19): 500 mg via ORAL
  Filled 2019-02-25 (×22): qty 1

## 2019-02-25 MED ORDER — AMPICILLIN 250 MG PO CAPS
250.0000 mg | ORAL_CAPSULE | Freq: Four times a day (QID) | ORAL | Status: DC
  Filled 2019-02-25 (×5): qty 1

## 2019-02-25 MED ORDER — AMPICILLIN 250 MG PO CAPS: 250.0000 mg | ORAL_CAPSULE | Freq: Four times a day (QID) | ORAL | Status: DC

## 2019-02-25 NOTE — Progress Notes (Signed)
Physical Therapy Weekly Progress Note  Patient Details  Name: Christy Carter MRN: [id number] Date of Birth: February 06, 1934  Beginning of progress report period: February 18, 2019 End of progress report period: February 25, 2019  Today's Date: 02/25/2019 PT Individual Time: 0803-0905 PT Individual Time Calculation (min): 62 min   Patient has met 3 of 4 short term goals. Christy Carter is progressing with therapy demonstrating improvement in ability to perform bed mobility with min assist, sit<>stand transfers with varying min/mod assist for lifting based on fatigue and height of surface, bed<>chair transfers with mod assist using various techniques, and progression to gait using RW up to 52f with min/mod assist. She is making slow progress due to continued confusion and impaired strength, motor planning, L inatteniton, impaired awareness, and endurance.   Patient continues to demonstrate the following deficits muscle weakness, decreased cardiorespiratoy endurance, impaired timing and sequencing, unbalanced muscle activation and decreased motor planning, decreased visual perceptual skills, decreased attention to left, decreased initiation, decreased attention, decreased awareness, decreased problem solving, decreased safety awareness, decreased memory and delayed processing, peripheral and decreased sitting balance, decreased standing balance, decreased postural control and decreased balance strategies and therefore will continue to benefit from skilled PT intervention to increase functional independence with mobility.  Patient progressing toward long term goals..  Continue plan of care.  PT Short Term Goals Week 2:  PT Short Term Goal 1 (Week 2): Pt wil perform supine<>sit with min assist PT Short Term Goal 1 - Progress (Week 2): Met PT Short Term Goal 2 (Week 2): Pt will perform sit<>stand using LRAD with min assist, from chair height PT Short Term Goal 2 - Progress (Week 2): Progressing toward goal PT  Short Term Goal 3 (Week 2): Pt will perform bed<>chair transfer using LRAD with mod assist of 1 PT Short Term Goal 3 - Progress (Week 2): Met PT Short Term Goal 4 (Week 2): Pt will ambulate at least 144fwith max assist using LRAD PT Short Term Goal 4 - Progress (Week 2): Met Week 3:  PT Short Term Goal 1 (Week 3): Pt will perform supine<>sit with CGA PT Short Term Goal 2 (Week 3): Pt will perform sit<>stand with min assist consistently PT Short Term Goal 3 (Week 3): Pt will perform bed<>chair transfers using LRAD with min assist PT Short Term Goal 4 (Week 3): Pt will ambulate at least 353fsing LRAD with min assist  Skilled Therapeutic Interventions/Progress Updates:  Ambulation/gait training;Community reintegration;DME/adaptive equipment instruction;Neuromuscular re-education;Psychosocial support;Stair training;UE/LE Strength taining/ROM;Wheelchair propulsion/positioning;Balance/vestibular training;Discharge planning;Functional electrical stimulation;Pain management;Skin care/wound management;Therapeutic Activities;UE/LE Coordination activities;Cognitive remediation/compensation;Disease management/prevention;Functional mobility training;Patient/family education;Splinting/orthotics;Therapeutic Exercise;Visual/perceptual remediation/compensation  Pt received supine in bed and agreeable to therapy session. Continues to be confused stating she spent the night in the therapy gym last night; however, pt oriented to location stating she is at Christy Carter is not oriented to situation stating she has "something with a T" wrong with her - therapist reoriented her to situation and date. Therapist donned B LE TED hose and shoes max assist for time management. Supine>sit, HOB at 30degrees and using bedrails, with CGA for steadying and significantly increased time. Squat pivot EOB>w/c with mod assist for lifting/pivoting hips and mod cuing for sequencing and increased anterior trunk lean.   Transported to/from gym in w/c. MD present for morning assessment and pt becomes tearful that she is not able to go home sooner - therapist provided emotional support and education regarding her progress and long term goals prior to  D/C with pt's mood improving. Sit<>stands using RW with min progressed to mod assist for lifting due to fatigue towards end of session - mod cuing for proper UE placement on AD and increased hip/knee and trunk extension. Ambulated 22ft, 36ft (seated break between) using RW with min/mod assist of 1 and +2 assist for w/c follow in event of fatigue - demonstrates decreased B LE foot clearance (L less than R), significantly decreased gait speed, L inattention bumping into items on L, decreased B LE step length (L less than R), and increased anterior forward trunk flexion - cuing throughout for improved gait mechanics. Therapist reassessed L and R Dix Hallpike test and pt reported no symptoms when lying supine; however, during L test upon return to sitting pt reported significant dizziness/"drunk" feeling that required ~30seconds for dissipation and no nystagmus noted - reported slight dizziness/"drunk" feeling upon return to sitting from R test again no nystagmus noted and symptoms less severe and lasting shorter duration. Pt requesting to use bathroom. Sit>stand w/c>stedy with mod assist for lifting into standing. Stedy transfer to BSC over toilet. Sit<>stand in stedy with min assist - standing in stedy with CGA for steadying while therapist performed total assist LB clothing management. Pt continent of bladder. Pt left sitting on BSC over toilet with stedy in place and RN present to assume care of patient.   Therapy Documentation Precautions:  Precautions Precautions: Fall, Other (comment) Precaution Comments: L inattention, left hemiparesis Restrictions Weight Bearing Restrictions: No  Pain: Denies pain during session.  Therapy/Group: Individual Therapy   M , PT,  DPT 02/25/2019, 7:50 AM  

## 2019-02-25 NOTE — Progress Notes (Signed)
Occupational Therapy Session Note  Patient Details  Name: Christy Carter MRN: [id number] Date of Birth: 1934/02/29  Today's Date: 02/25/2019 OT Individual Time: [id number] OT Individual Time Calculation (min): 71 min    Short Term Goals: Week 3:  OT Short Term Goal 1 (Week 3): Pt will be able to sit to stand from w/c with min A to prepare for ADL transfers. OT Short Term Goal 2 (Week 3): Pt will be able to maintain dynamic stand with min A as she pulls pants over her hips. OT Short Term Goal 3 (Week 3): Pt will be able to toilet with min A. OT Short Term Goal 4 (Week 3): Pt will demonstrate improved L side awareness and praxis to stand pivot transfer with min A to her L with only min cues.  Skilled Therapeutic Interventions/Progress Updates:    Pt transferred from supine to sit EOB with min guard assist on the left side of the bed.  No report of dizziness with this transition.  Once on the side, she was able to work on donning her slip on shoes with mod assist for crossing the LLE over the right knee.  She then completed stand pivot transfer to the right with mod assist and therapist in front of her.  She was then taken down to the therapy gym via wheelchair and completed stand pivot transfer to the left at mod assist again.  She was noted to have bladder incontinence, so had her transfer back to the wheelchair and she was transported back to the room.  Worked at the sink on standing with mod assist while removing dirty brief and pants.  She was able to complete washing her front peri area with mod assist in standing.  Min assist for threading brief with mod assist to thread pants following hemi dressing techniques.  She integrated the LUE with mod assist for pulling the garments over her hips as well.  Once complete, she was then taken to the dayroom where she completed stand pivot transfers with use of the RW to the left side with mod assist and max step by step cueing.  Worked on sit to stand from  the [facility] while taking steps to the left.  She needed mod assist to advance the LLE to the left as she would continually step slightly more forward than sideways.  She improved with side stepping to the right however with better control to adduct the left foot.  She completed stand pivot from the mat to the wheelchair again at mod assist level with max instructional cueing to sequence stepping and turning secondary to motor planning deficits.  Finished session with transfer back to the room and pt left sitting up in the wheelchair with call button and phone in reach and daughter present.    Therapy Documentation Precautions:  Precautions Precautions: Fall, Other (comment) Precaution Comments: L inattention, left hemiparesis Restrictions Weight Bearing Restrictions: No  Pain: Pain Assessment Pain Scale: Faces Pain Score: 0-No pain Faces Pain Scale: No hurt ADL: See Care Tool Section for some details of mobility and selfcare  Therapy/Group: Individual Therapy  Sharnita Bogucki OTR/L 02/25/2019, 4:09 PM

## 2019-02-25 NOTE — Progress Notes (Signed)
Modified Barium Swallow Progress Note  Patient Details  Name: Christy Carter MRN: [id number] Date of Birth: 12/30/1933  Today's Date: 02/25/2019  Modified Barium Swallow completed.  Full report located under Chart Review in the Imaging Section.  Brief recommendations include the following:  Clinical Impression  Pt presents with mild oropharyngeal dysphagia. Her oral phase is c/b prolonged mastication of soft solids and multi-consistency items. When consuming thin liquids, pt's swallow is initiated at pyriform sinus which results in penetration of thin liquids but she is able to protect her airway. Even when consuming multiple consecutive sips of thin liquids via straw, pt is able to demonstrate good glottal closure and airway protection. Recommend pt be upgraded to thin liquids and medicine whole with thin liquids.   Swallow Evaluation Recommendations       SLP Diet Recommendations: Dysphagia 2 (Fine chop) solids;Thin liquid   Liquid Administration via: Cup;Straw   Medication Administration: Whole meds with liquid   Supervision: Patient able to self feed;Intermittent supervision to cue for compensatory strategies   Compensations: Slow rate;Small sips/bites;Minimize environmental distractions   Postural Changes: Seated upright at 90 degrees   Oral Care Recommendations: Oral care BID        Conard Alvira 02/25/2019,1:47 PM

## 2019-02-25 NOTE — Progress Notes (Signed)
Speech Language Pathology Weekly Progress and Session Note  Patient Details  Name: Christy Carter MRN: [id number] Date of Birth: December 11, 1933  Beginning of progress report period: February 18, 2019 End of progress report period: February 25, 2019    Short Term Goals: Week 2: SLP Short Term Goal 1 (Week 2): Pt will consume dys 2 textures and nectar thick liquids with supervision cues for use of swallowing precautions and minimal overt s/s of aspiration. SLP Short Term Goal 1 - Progress (Week 2): Met SLP Short Term Goal 2 (Week 2): Pt will consume therapeutic trials of thin liquids with supervision cues for use of swallowing precautions and minimal overt s/s of aspiration over 2 consecutive sessions prior to repeat instrumental assessment. SLP Short Term Goal 2 - Progress (Week 2): Met SLP Short Term Goal 3 (Week 2): Pt will locate items to the left of midline in >25% of opportunities during basic, familiar tasks with mod assist multimodal cues. SLP Short Term Goal 3 - Progress (Week 2): Not met SLP Short Term Goal 4 (Week 2): Pt will complete basic, familiar tasks wtih mod assist multimodal cues for functional problem solving. SLP Short Term Goal 4 - Progress (Week 2): Not met SLP Short Term Goal 5 (Week 2): Pt will utilize external aids to recall basic, daily information with mod assist verbal cues. SLP Short Term Goal 5 - Progress (Week 2): Not met SLP Short Term Goal 6 (Week 2): Pt will sustain attention to basic familiar task for 15 minutes with Mod A cues for redirection to task. SLP Short Term Goal 6 - Progress (Week 2): Not met    New Short Term Goals: Week 3: SLP Short Term Goal 1 (Week 3): Pt will consume dysphagia 2 diet with thin liquids and superivison level cues for use of compensatory swallow strategies. SLP Short Term Goal 2 (Week 3): Pt will consume trials of dysphagia 3 with Min A cues for use of compensatory swallow strategies. SLP Short Term Goal 3 (Week 3): Pt will locate  items to the left of midline in >25% of opportunities during basic, familiar tasks with mod assist multimodal cues. SLP Short Term Goal 4 (Week 3): Pt will utilize external aids to recall basic, daily information with mod assist verbal cues. SLP Short Term Goal 5 (Week 3): Pt will complete basic, familiar tasks wtih mod assist multimodal cues for functional problem solving. SLP Short Term Goal 6 (Week 3): Pt will sustain attention to basic familiar task for 15 minutes with Mod A cues for redirection to task.  Weekly Progress Updates: Pt has experienced fluctuating periods of confusion over this reporting period and as a result she has made slow progress. MBS recently completed with pt demonstrating improved ability to protect airway when consuming thin liquids. Recommend upgrade to thin liquids but continue dysphagia 2 d/t lack of dentition and deficits in mentation. Pt would benefit from further trials of dysphagia 3 at bedside prior to upgrade. Skilled ST continues to be indicated to target moderate cognitive deficits in sustained attention, problem solving, left inattention, memory, and overall insight into deficits. Education has been on-going with pt's daughter.       Intensity: Minumum of 1-2 x/day, 30 to 90 minutes Frequency: 3 to 5 out of 7 days Duration/Length of Stay: 10/28 Treatment/Interventions: Cognitive remediation/compensation;Cueing hierarchy;Functional tasks;Environmental controls;Dysphagia/aspiration precaution training;Internal/external aids;Patient/family education      General    Pain Pain Assessment Pain Score: 0-No pain   Javonna Balli 02/25/2019, 1:58 PM

## 2019-02-25 NOTE — Progress Notes (Signed)
Occupational Therapy Weekly Progress Note  Patient Details  Name: Christy Carter MRN: [id number] Date of Birth: 1934/02/11  Beginning of progress report period: February 18, 2019 End of progress report period: February 25, 2019  Today's Date: 02/25/2019 OT Individual Time: 3154-0086 OT Individual Time Calculation (min): 61 min    Patient has met 3 of 5 short term goals.  Pt is continuing to progress but she continues to need mod A with sit to stand, transfers, and balance vs the STG of min A.     OT Short Term Goal 1 (Week 2): Pt will be able to rise to stand to RW with min A of 1 to prepare for LB self care. OT Short Term Goal 1 - Progress (Week 2): Progressing toward goal(Pt can rise from elevated surface with min A (bed, toilet) but needs mod A from w/c) OT Short Term Goal 2 (Week 2): Pt will be able to stand with mod A for dynamic balance as she pulls pants over hips with min A. OT Short Term Goal 2 - Progress (Week 2): Met OT Short Term Goal 3 (Week 2): Min A squat or stand pivot to toilet. OT Short Term Goal 3 - Progress (Week 2): Progressing toward goal(Pt can stand pivot to toilet with bar with mod A.) OT Short Term Goal 4 (Week 2): Pt will don shirt with min A. OT Short Term Goal 4 - Progress (Week 2): Met OT Short Term Goal 5 (Week 2): Pt will don pants with min A. OT Short Term Goal 5 - Progress (Week 2): Met  Patient continues to demonstrate the following deficits: muscle weakness, decreased cardiorespiratoy endurance, unbalanced muscle activation, decreased coordination and decreased motor planning, decreased attention to left, decreased awareness, decreased problem solving and delayed processing and decreased standing balance, decreased postural control and hemiplegia and therefore will continue to benefit from skilled OT intervention to enhance overall performance with BADL.  Patient progressing toward long term goals..  Continue plan of care.  OT Short Term Goals Week 1:   OT Short Term Goal 1 (Week 1): Pt will don shirt with mod A OT Short Term Goal 1 - Progress (Week 1): Met OT Short Term Goal 2 (Week 1): Pt will maintain static EOB sitting balance with min A while completing ADL task OT Short Term Goal 2 - Progress (Week 1): Met OT Short Term Goal 3 (Week 1): Pt will complete LB bathing bed level with LRAD with mod A OT Short Term Goal 3 - Progress (Week 1): Met OT Short Term Goal 4 (Week 1): Pt will require only mod cueing for attending to L side of body during UB bathing OT Short Term Goal 4 - Progress (Week 1): Met Week 2:  OT Short Term Goal 1 (Week 2): Pt will be able to rise to stand to RW with min A of 1 to prepare for LB self care. OT Short Term Goal 1 - Progress (Week 2): Progressing toward goal(Pt can rise from elevated surface with min A (bed, toilet) but needs mod A from w/c) OT Short Term Goal 2 (Week 2): Pt will be able to stand with mod A for dynamic balance as she pulls pants over hips with min A. OT Short Term Goal 2 - Progress (Week 2): Met OT Short Term Goal 3 (Week 2): Min A squat or stand pivot to toilet. OT Short Term Goal 3 - Progress (Week 2): Progressing toward goal(Pt can stand pivot to toilet with bar  with mod A.) OT Short Term Goal 4 (Week 2): Pt will don shirt with min A. OT Short Term Goal 4 - Progress (Week 2): Met OT Short Term Goal 5 (Week 2): Pt will don pants with min A. OT Short Term Goal 5 - Progress (Week 2): Met Week 3:  OT Short Term Goal 1 (Week 3): Pt will be able to sit to stand from w/c with min A to prepare for ADL transfers. OT Short Term Goal 2 (Week 3): Pt will be able to maintain dynamic stand with min A as she pulls pants over her hips. OT Short Term Goal 3 (Week 3): Pt will be able to toilet with min A. OT Short Term Goal 4 (Week 3): Pt will demonstrate improved L side awareness and praxis to stand pivot transfer with min A to her L with only min cues.  Skilled Therapeutic Interventions/Progress Updates:     Pt received in bed and agreeable to therapy.  ADL Retraining: Pt agreeable to working on toilet transfers and shower stall transfers/ shower:  Eating: Supervision/safety Grooming: Supervision/safety Where Assessed-Grooming: Sitting at sink Upper Body Bathing: Supervision/safety Where Assessed-Upper Body Bathing: Shower Lower Body Bathing: Contact guard Where Assessed-Lower Body Bathing: Shower Upper Body Dressing: Minimal assistance Where Assessed-Upper Body Dressing: Wheelchair Lower Body Dressing: Moderate assistance Where Assessed-Lower Body Dressing: Wheelchair Toileting: Moderate assistance Where Assessed-Toileting: Glass blower/designer: Moderate assistance Toilet Transfer Method: Stand pivot Toilet Transfer Equipment: Grab bars, Raised toilet seat Social research officer, government: Moderate assistance Social research officer, government Method: Radiographer, therapeutic: Radio broadcast assistant, Grab bars   Pt participated well and is actively using her LUE. Her main struggle is with L side awareness and motor planning with stand pivot transfers to her L.  She tends to step forward towards the surface she is moving to so she ends up facing the seat and has great difficulty motor planning how to step her LLE out at an angle.  She did much better when cued to look where she need to transfer to and then turning her head to look in the opposite  Direction.  Pt very pleased with her progress and is feeling encouraged about her rehab.    Pt resting in wc with quick release belt on and all needs met.     Therapy Documentation Precautions:  Precautions Precautions: Fall, Other (comment) Precaution Comments: L inattention, left hemiparesis Restrictions Weight Bearing Restrictions: No  Pain: Pain Assessment Pain Score: 0-No pain ADL: ADL Eating: Supervision/safety Grooming: Supervision/safety Where Assessed-Grooming: Sitting at sink Upper Body Bathing: Supervision/safety Where  Assessed-Upper Body Bathing: Shower Lower Body Bathing: Contact guard Where Assessed-Lower Body Bathing: Shower Upper Body Dressing: Minimal assistance Where Assessed-Upper Body Dressing: Wheelchair Lower Body Dressing: Moderate assistance Where Assessed-Lower Body Dressing: Wheelchair Toileting: Moderate assistance Where Assessed-Toileting: Glass blower/designer: Moderate assistance Toilet Transfer Method: Stand pivot Toilet Transfer Equipment: Grab bars, Raised toilet seat Social research officer, government: Moderate assistance Social research officer, government Method: Radiographer, therapeutic: Radio broadcast assistant, Grab bars   Therapy/Group: Individual Therapy  [place] 02/25/2019, 12:29 PM

## 2019-02-25 NOTE — Progress Notes (Signed)
[facility] PHYSICAL MEDICINE & REHABILITATION PROGRESS NOTE  Subjective/Complaints: Patient seen sitting up in a chair working with therapy this morning.  She states he slept well overnight.  She still has some confusion regarding where she was last night.  Discussed confusion with therapy as well.  She is however aware of the plans for today.  ROS: Denies SOB, CP, N/V/D.   Objective:   No results found. No results for input(s): WBC, HGB, HCT, PLT in the last 72 hours. Recent Labs    02/24/19 0532 02/25/19 0648  NA 143 141  K 4.1 4.1  CL 111 109  CO2 24 24  GLUCOSE 94 91  BUN 39* 27*  CREATININE 1.67* 1.31*  CALCIUM 8.9 8.9    Intake/Output Summary (Last 24 hours) at 02/25/2019 1249 Last data filed at 02/25/2019 0800 Gross per 24 hour  Intake 1720 ml  Output -  Net 1720 ml     Physical Exam: Vital Signs Blood pressure (!) 122/59, pulse (!) 56, temperature 98.1 F (36.7 C), temperature source Oral, resp. rate 18, height 5\' 7"  (1.702 m), weight 104.8 kg, SpO2 99 %. Constitutional: No distress . Vital signs reviewed.  Morbidly obese. HENT: Normocephalic.  Atraumatic. Eyes: EOMI. No discharge. Cardiovascular: No JVD. Respiratory: Normal effort.  No stridor. GI: Non-distended. Skin: Warm and dry.  Intact. Psych: Normal mood.  Normal behavior. Musc: Mild lower extremity edema Neurological:Alert and oriented x3 with increased time and some cues Motor: LUE/LLE: 4/5 proximal to distal, improving RUE/RLE: 5/5 proximal distal, unchanged  Assessment/Plan: 1. Functional deficits secondary to Left hemiparesis with dysphagia/dysarthriasecondary to right watershed territory infarction in the setting of severe stenosis right M1 segment status post revascularization which require 3+ hours per day of interdisciplinary therapy in a comprehensive inpatient rehab setting.  Physiatrist is providing close team supervision and 24 hour management of active medical problems listed  below.  Physiatrist and rehab team continue to assess barriers to discharge/monitor patient progress toward functional and medical goals  Care Tool:  Bathing  Bathing activity did not occur: Safety/medical concerns Body parts bathed by patient: Right arm, Left arm, Chest, Abdomen, Front perineal area, Buttocks, Right upper leg, Left upper leg, Right lower leg, Left lower leg, Face   Body parts bathed by helper: Right lower leg, Left lower leg     Bathing assist Assist Level: Contact Guard/Touching assist     Upper Body Dressing/Undressing Upper body dressing   What is the patient wearing?: Bra, Pull over shirt    Upper body assist Assist Level: Minimal Assistance - Patient > 75%    Lower Body Dressing/Undressing Lower body dressing      What is the patient wearing?: Incontinence brief, Pants     Lower body assist Assist for lower body dressing: Moderate Assistance - Patient 50 - 74%     Toileting Toileting    Toileting assist Assist for toileting: Moderate Assistance - Patient 50 - 74%     Transfers Chair/bed transfer  Transfers assist     Chair/bed transfer assist level: Moderate Assistance - Patient 50 - 74%     Locomotion Ambulation   Ambulation assist   Ambulation activity did not occur: Safety/medical concerns  Assist level: Moderate Assistance - Patient 50 - 74% Assistive device: Walker-rolling Max distance: 10 ft   Walk 10 feet activity   Assist  Walk 10 feet activity did not occur: Safety/medical concerns  Assist level: Moderate Assistance - Patient - 50 - 74% Assistive device: Walker-rolling  Walk 50 feet activity   Assist Walk 50 feet with 2 turns activity did not occur: Safety/medical concerns         Walk 150 feet activity   Assist Walk 150 feet activity did not occur: Safety/medical concerns         Walk 10 feet on uneven surface  activity   Assist Walk 10 feet on uneven surfaces activity did not occur:  Safety/medical concerns         Wheelchair     Assist Will patient use wheelchair at discharge?: Yes Type of Wheelchair: Manual Wheelchair activity did not occur: Safety/medical concerns  Wheelchair assist level: Moderate Assistance - Patient 50 - 74% Max wheelchair distance: 95'    Wheelchair 50 feet with 2 turns activity    Assist    Wheelchair 50 feet with 2 turns activity did not occur: Safety/medical concerns       Wheelchair 150 feet activity     Assist  Wheelchair 150 feet activity did not occur: Safety/medical concerns       Blood pressure (!) 122/59, pulse (!) 56, temperature 98.1 F (36.7 C), temperature source Oral, resp. rate 18, height 5\' 7"  (1.702 m), weight 104.8 kg, SpO2 99 %.  Medical Problem List and Plan: 1.Left hemiplegia with dysphagia/dysarthriasecondary to right watershed territory infarction in the setting of severe stenosis right M1 segment status post revascularization as well as small left lateral posterior temporal lobe infarct in right middle cerebellar peduncle infarct  Continue CIR 2. Antithrombotics: -DVT/anticoagulation:Subcutaneous heparin -antiplatelet therapy: Aspirin 81 mg daily, Brilinta 90 mg twice daily 3. Pain Management:Tylenol as needed  Added voltaren gel for R foot 4. Mood:Seroquel 12.5 mg nightly -antipsychotic agents: N/A 5. Neuropsych: She is not fully capable of making decisions onherown behalf. 6. Skin/Wound Care:Routine skin checks 7. Fluids/Electrolytes/Nutrition:Routine in and outs 8. Post strokedysphagia.   D2 nectars, plan for MBS today  Encourage fluids  Advance diet as tolerated  9. Hypertension.   Maxide 37.5-25 mg daily DC'd on 10/14 due to AKI  Continue clonidine patch 0.1 mg change every 7 days, Zebeta 2.5 mg daily.   Vitals:   02/24/19 1921 02/25/19 0515  BP: (!) 147/41 (!) 122/59  Pulse: (!) 57 (!) 56  Resp: 17 18  Temp: 98.1 F (36.7  C) 98.1 F (36.7 C)  SpO2: 96% 99%   Slightly labile, but overall controlled on 10/16 10. Hyperlipidemia. Lipitor 11. Hypothyroidism. Synthroid 12. R foot pain- Mid foot OA:?  Resolved 13.  Leukocytosis  WBC 7.7 on 10/13, labs ordered for tomorrow  Afebrile  Continue to monitor 14.  AKI  Creatinine 1.31 on 10/16  Echo reviewed-EF 65-70% with hyperdynamic left ventricle  IVF changed to nightly on 10/15  UA clean, Ucx Enterococcus Faecalis-?  Contaminant.  Discussed with nursing no signs/symptoms of infection at present 15.  Hyperglycemia  Improving on 10/15 16.  Metabolic encephalopathy  See #14  Slowly improving  LOS: 14 days A FACE TO FACE EVALUATION WAS PERFORMED  Dhaval Woo [name] 02/25/2019, 12:49 PM

## 2019-02-26 ENCOUNTER — Inpatient Hospital Stay (HOSPITAL_COMMUNITY): Payer: Medicare Other | Admitting: Physical Therapy

## 2019-02-26 ENCOUNTER — Inpatient Hospital Stay (HOSPITAL_COMMUNITY): Payer: Medicare Other | Admitting: Speech Pathology

## 2019-02-26 LAB — CBC WITH DIFFERENTIAL/PLATELET
Abs Immature Granulocytes: 0.07 10*3/uL (ref 0.00–0.07)
Basophils Absolute: 0.1 10*3/uL (ref 0.0–0.1)
Basophils Relative: 1 %
Eosinophils Absolute: 0.8 10*3/uL — ABNORMAL HIGH (ref 0.0–0.5)
Eosinophils Relative: 11 %
HCT: 40.1 % (ref 36.0–46.0)
Hemoglobin: 12.6 g/dL (ref 12.0–15.0)
Immature Granulocytes: 1 %
Lymphocytes Relative: 19 %
Lymphs Abs: 1.4 10*3/uL (ref 0.7–4.0)
MCH: 29.7 pg (ref 26.0–34.0)
MCHC: 31.4 g/dL (ref 30.0–36.0)
MCV: 94.6 fL (ref 80.0–100.0)
Monocytes Absolute: 0.7 10*3/uL (ref 0.1–1.0)
Monocytes Relative: 10 %
Neutro Abs: 4.1 10*3/uL (ref 1.7–7.7)
Neutrophils Relative %: 58 %
Platelets: 244 10*3/uL (ref 150–400)
RBC: 4.24 MIL/uL (ref 3.87–5.11)
RDW: 16 % — ABNORMAL HIGH (ref 11.5–15.5)
WBC: 7.1 10*3/uL (ref 4.0–10.5)
nRBC: 0 % (ref 0.0–0.2)

## 2019-02-26 MED ORDER — SENNOSIDES-DOCUSATE SODIUM 8.6-50 MG PO TABS
1.0000 | ORAL_TABLET | Freq: Every day | ORAL | Status: DC
  Administered 2019-02-26 – 2019-03-08 (×8): 1 via ORAL
  Filled 2019-02-26 (×10): qty 1

## 2019-02-26 NOTE — Progress Notes (Signed)
Speech Language Pathology Daily Session Note  Patient Details  Name: Christy Carter MRN: [id number] Date of Birth: 31-Dec-1933  Today's Date: 02/26/2019 SLP Individual Time: [id number] SLP Individual Time Calculation (min): 60 min  Short Term Goals: Week 3: SLP Short Term Goal 1 (Week 3): Pt will consume dysphagia 2 diet with thin liquids and superivison level cues for use of compensatory swallow strategies. SLP Short Term Goal 2 (Week 3): Pt will consume trials of dysphagia 3 with Min A cues for use of compensatory swallow strategies. SLP Short Term Goal 3 (Week 3): Pt will locate items to the left of midline in >25% of opportunities during basic, familiar tasks with mod assist multimodal cues. SLP Short Term Goal 4 (Week 3): Pt will utilize external aids to recall basic, daily information with mod assist verbal cues. SLP Short Term Goal 5 (Week 3): Pt will complete basic, familiar tasks wtih mod assist multimodal cues for functional problem solving. SLP Short Term Goal 6 (Week 3): Pt will sustain attention to basic familiar task for 15 minutes with Mod A cues for redirection to task.  Skilled Therapeutic Interventions: Pt was seen for skilled ST targeting dysphagia and cognitive goals. Pt was upbeat and pleasant, eager to participate in [place] upon therapist's arrival. She was very excited about her recent upgrade to thin liquids. SLP provided skilled observation of pt consuming thin coffee as well as upgraded trial of dysphagia 3 solid. No overt s/s aspiration observed throughout intake. Pt required Min A verbal cues for use of swallow strategies to achieve efficient oral clearance of solids, which appeared much improved since this SLP last observed pt with solid POs. Will continue to assess readiness for solid advancement in the coming week - continue Dys 2, thin liquid diet for now. Pt was oriented to self and place independently, however required Mod A cues to locate and use calendar to orient to  month and date. SLP further facilitated session with fluctuating Mod-Max A verbal and visual cues for error awareness and problem solving during a basic PEG board design task. Although she is very stimulable to attend to left side of body and visual field, pt required Mod A verbal cues to do so during tasks. Pt sustained attention to tasks in 5 to 10 minute intervals with Min A for redirection. Pt was left in bed with alarm set and all needs within reach. Continue per current plan of care.   Pain Pain Assessment Pain Scale: 0-10 Pain Score: 0-No pain  Therapy/Group: Individual Therapy  [name] 02/26/2019, 11:37 AM

## 2019-02-26 NOTE — Progress Notes (Signed)
[facility] PHYSICAL MEDICINE & REHABILITATION PROGRESS NOTE  Subjective/Complaints: Patient seen laying in bed this morning.  She states she slept fairly overnight.  She states she was confused overnight, but aware of her confusion not confused this morning.  ROS: Denies SOB, CP, N/V/D.   Objective:   No results found. Recent Labs    02/26/19 0604  WBC 7.1  HGB 12.6  HCT 40.1  PLT 244   Recent Labs    02/24/19 0532 02/25/19 0648  NA 143 141  K 4.1 4.1  CL 111 109  CO2 24 24  GLUCOSE 94 91  BUN 39* 27*  CREATININE 1.67* 1.31*  CALCIUM 8.9 8.9    Intake/Output Summary (Last 24 hours) at 02/26/2019 1455 Last data filed at 02/26/2019 1247 Gross per 24 hour  Intake 1931.05 ml  Output -  Net 1931.05 ml     Physical Exam: Vital Signs Blood pressure (!) 157/48, pulse (!) 58, temperature 97.7 F (36.5 C), temperature source Oral, resp. rate 18, height 5\' 7"  (1.702 m), weight 104.8 kg, SpO2 98 %. Constitutional: No distress . Vital signs reviewed.  Morbidly obese. HENT: Normocephalic.  Atraumatic. Eyes: EOMI. No discharge. Cardiovascular: No JVD. Respiratory: Normal effort.  No stridor. GI: Non-distended. Skin: Warm and dry.  Intact. Psych: Normal mood.  Normal behavior. Musc: Lower extremity edema. Neurological:Alert and oriented x3 Motor: LUE/LLE: 4/5 proximal to distal, stable RUE/RLE: 5/5 proximal distal, unchanged  Assessment/Plan: 1. Functional deficits secondary to Left hemiparesis with dysphagia/dysarthriasecondary to right watershed territory infarction in the setting of severe stenosis right M1 segment status post revascularization which require 3+ hours per day of interdisciplinary therapy in a comprehensive inpatient rehab setting.  Physiatrist is providing close team supervision and 24 hour management of active medical problems listed below.  Physiatrist and rehab team continue to assess barriers to discharge/monitor patient progress toward  functional and medical goals  Care Tool:  Bathing  Bathing activity did not occur: Safety/medical concerns Body parts bathed by patient: Right arm, Left arm, Chest, Abdomen, Front perineal area, Buttocks, Right upper leg, Left upper leg, Right lower leg, Left lower leg, Face   Body parts bathed by helper: Right lower leg, Left lower leg     Bathing assist Assist Level: Contact Guard/Touching assist     Upper Body Dressing/Undressing Upper body dressing   What is the patient wearing?: Bra, Pull over shirt    Upper body assist Assist Level: Minimal Assistance - Patient > 75%    Lower Body Dressing/Undressing Lower body dressing      What is the patient wearing?: Incontinence brief, Pants     Lower body assist Assist for lower body dressing: Moderate Assistance - Patient 50 - 74%     Toileting Toileting    Toileting assist Assist for toileting: Maximal Assistance - Patient 25 - 49%     Transfers Chair/bed transfer  Transfers assist     Chair/bed transfer assist level: Moderate Assistance - Patient 50 - 74%     Locomotion Ambulation   Ambulation assist   Ambulation activity did not occur: Safety/medical concerns  Assist level: 2 helpers(mod A and w/c follow) Assistive device: Walker-platform Max distance: 68ft   Walk 10 feet activity   Assist  Walk 10 feet activity did not occur: Safety/medical concerns  Assist level: 2 helpers(mod A and w/c follow) Assistive device: Walker-rolling   Walk 50 feet activity   Assist Walk 50 feet with 2 turns activity did not occur: Safety/medical concerns  Walk 150 feet activity   Assist Walk 150 feet activity did not occur: Safety/medical concerns         Walk 10 feet on uneven surface  activity   Assist Walk 10 feet on uneven surfaces activity did not occur: Safety/medical concerns         Wheelchair     Assist Will patient use wheelchair at discharge?: Yes Type of Wheelchair:  Manual Wheelchair activity did not occur: Safety/medical concerns  Wheelchair assist level: Moderate Assistance - Patient 50 - 74% Max wheelchair distance: 18'    Wheelchair 50 feet with 2 turns activity    Assist    Wheelchair 50 feet with 2 turns activity did not occur: Safety/medical concerns       Wheelchair 150 feet activity     Assist  Wheelchair 150 feet activity did not occur: Safety/medical concerns       Blood pressure (!) 157/48, pulse (!) 58, temperature 97.7 F (36.5 C), temperature source Oral, resp. rate 18, height 5\' 7"  (1.702 m), weight 104.8 kg, SpO2 98 %.  Medical Problem List and Plan: 1.Left hemiplegia with dysphagia/dysarthriasecondary to right watershed territory infarction in the setting of severe stenosis right M1 segment status post revascularization as well as small left lateral posterior temporal lobe infarct in right middle cerebellar peduncle infarct  Continue CIR 2. Antithrombotics: -DVT/anticoagulation:Subcutaneous heparin -antiplatelet therapy: Aspirin 81 mg daily, Brilinta 90 mg twice daily 3. Pain Management:Tylenol as needed  Added voltaren gel for R foot 4. Mood:Seroquel 12.5 mg nightly -antipsychotic agents: N/A 5. Neuropsych: She is not fully capable of making decisions onherown behalf. 6. Skin/Wound Care:Routine skin checks 7. Fluids/Electrolytes/Nutrition:Routine in and outs 8. Post strokedysphagia.   D2 nectars, advanced to D2 thins  Encourage fluids  Advance diet as tolerated  9. Hypertension.   Maxide 37.5-25 mg daily DC'd on 10/14 due to AKI  Continue clonidine patch 0.1 mg change every 7 days, Zebeta 2.5 mg daily.   Vitals:   02/26/19 0314 02/26/19 1322  BP: (!) 151/59 (!) 157/48  Pulse: 61 (!) 58  Resp: 16 18  Temp: 97.9 F (36.6 C) 97.7 F (36.5 C)  SpO2: 98% 98%   Trending up on 10/17 10. Hyperlipidemia. Lipitor 11. Hypothyroidism. Synthroid 12. R  foot pain- Mid foot OA:?  Resolved 13.  Leukocytosis  WBC 7.1 on 10/17  Afebrile  Continue to monitor 14.  AKI  Creatinine 1.31 on 10/16, labs ordered for Monday  Echo reviewed-EF 65-70% with hyperdynamic left ventricle  IVF changed to nightly on 10/15  UA clean, Ucx Enterococcus Faecalis-?  Contaminant.  Discussed with nursing no signs/symptoms of infection at present, repeat urine culture ordered 15.  Hyperglycemia  Improving on 10/15 16.  Metabolic encephalopathy  See #14  Slowly improving  LOS: 15 days A FACE TO FACE EVALUATION WAS PERFORMED  Ankit [name] 02/26/2019, 2:55 PM

## 2019-02-26 NOTE — Progress Notes (Signed)
Physical Therapy Session Note  Patient Details  Name: Christy Carter MRN: [id number] Date of Birth: 1933/07/22  Today's Date: 02/26/2019 PT Individual Time: [id number] PT Individual Time Calculation (min): 58 min   Short Term Goals: Week 3:  PT Short Term Goal 1 (Week 3): Pt will perform supine<>sit with CGA PT Short Term Goal 2 (Week 3): Pt will perform sit<>stand with min assist consistently PT Short Term Goal 3 (Week 3): Pt will perform bed<>chair transfers using LRAD with min assist PT Short Term Goal 4 (Week 3): Pt will ambulate at least 60ft using LRAD with min assist  Skilled Therapeutic Interventions/Progress Updates:   Pt received sitting in her w/c reaching outside BOS to fold towels and blankets on her bed appearing to be more restless today as well as having increasing awareness of her situation. Pt is emotionally labile during session stating "I don't want to be a burden to anyone" therapist provided emotional support and encouragement regarding her progress in CIR with pt's mood improving. Pt also continues to demonstrate confusion stating "I was going to go get my haircut today but couldn't in the shape I'm in" but was able to recall at beginning of session that she was in the hospital. Therapist notified pt's RN and charge RN about possibly moving pt closer to the nurses station if possible for increased pt safety. Pt agreeable to therapy session. Transported to/from gym in w/c. Sit<>stands using RW with varying min/mod assist for lifting/lowering throughout session. L Stand pivot w/c>EOM using RW with mod assist for balance and max multimodal cuing for sequencing of moving AD and B LE stepping as well as increased L attention - pt demonstrates lack of adequate L LE lateral stepping during transfer despite cuing. Ambulated 12ft, 41ft (seated break between) using RW with min assist for balance during straight walking and mod assist for AD management and balance when turning to sit with  max multimodal cuing for sequencing and increased pt safety as pt unable to turn walker during the turn to sit requiring therapist's assist - demonstrates decreased B LE step length (L shorter than R), decreased L LE foot clearance during swing, increased L LE hip external rotation, and increased forward trunk flexion - cuing throughout for improvements . Standing with B UE support on RW and mirror feedback performed repeated L LE step up/down on 2" step to/from external target to promote increased hip abduction and hip flexion to improve those gait mechanics - mod assist for balance and max multimodal cuing for sequencing of task. Standing R/L lateral sidestepping at mat table with  BUE support on RW and mod assist for balance targeting hip abductor strengthening with mod assist for balance and max multimodal cuing for sequencing. L stand pivot EOM>w/c using RW with mod assist for balance and continued max multimodal cuing for sequencing of AD management and LE stepping when turning to sit. Transported back to nurses station in w/c as pt requesting not to return to her room and left with RN present and seat belt alarm on.  Therapy Documentation Precautions:  Precautions Precautions: Fall, Other (comment) Precaution Comments: L inattention, left hemiparesis Restrictions Weight Bearing Restrictions: No  Pain: Denies pain during session.   Therapy/Group: Individual Therapy  Tawana Scale, PT, DPT 02/26/2019, 3:08 PM

## 2019-02-26 NOTE — Plan of Care (Signed)
  Problem: Consults Goal: RH STROKE PATIENT EDUCATION Description: See Patient Education module for education specifics  Outcome: Progressing   Problem: RH BOWEL ELIMINATION Goal: RH STG MANAGE BOWEL WITH ASSISTANCE Description: STG Manage Bowel with min Assistance. Outcome: Progressing   Problem: RH BLADDER ELIMINATION Goal: RH STG MANAGE BLADDER WITH ASSISTANCE Description: STG Manage Bladder With min  Assistance Outcome: Progressing   Problem: RH SAFETY Goal: RH STG ADHERE TO SAFETY PRECAUTIONS W/ASSISTANCE/DEVICE Description: STG Adhere to Safety Precautions With Cues and reminders. Outcome: Progressing   Problem: RH KNOWLEDGE DEFICIT Goal: RH STG INCREASE KNOWLEDGE OF HYPERTENSION Description: Daughter will be able to demonstrate management of htn using handouts and reading resources for medications , bp monitoring and dietary independently. Outcome: Progressing Goal: RH STG INCREASE KNOWLEDGE OF DYSPHAGIA/FLUID INTAKE Description: Daughter will be able to demonstrate management of dysphagia using handouts and reading resources for medications dietary restrictions independently. Outcome: Progressing Goal: RH STG INCREASE KNOWLEGDE OF HYPERLIPIDEMIA Description: Daughter will be able to demonstrate management of hld using handouts and reading resources for medications and dietary independently. Outcome: Progressing Goal: RH STG INCREASE KNOWLEDGE OF STROKE PROPHYLAXIS Description: Daughter will be able to demonstrate management of stroke using handouts and reading resources for medications , bp monitoring and dietary restrictions independently. Outcome: Progressing

## 2019-02-27 ENCOUNTER — Inpatient Hospital Stay (HOSPITAL_COMMUNITY): Payer: Medicare Other

## 2019-02-27 DIAGNOSIS — I10 Essential (primary) hypertension: Secondary | ICD-10-CM

## 2019-02-27 DIAGNOSIS — E039 Hypothyroidism, unspecified: Secondary | ICD-10-CM

## 2019-02-27 DIAGNOSIS — N390 Urinary tract infection, site not specified: Secondary | ICD-10-CM

## 2019-02-27 DIAGNOSIS — R4189 Other symptoms and signs involving cognitive functions and awareness: Secondary | ICD-10-CM

## 2019-02-27 LAB — URINE CULTURE: Culture: <10000 — AB

## 2019-02-27 MED ORDER — FLUOXETINE HCL 10 MG PO CAPS
10.0000 mg | ORAL_CAPSULE | Freq: Every day | ORAL | Status: DC
  Administered 2019-02-27 – 2019-03-09 (×11): 10 mg via ORAL
  Filled 2019-02-27 (×11): qty 1

## 2019-02-27 NOTE — Progress Notes (Signed)
[facility] PHYSICAL MEDICINE & REHABILITATION PROGRESS NOTE  Subjective/Complaints: Patient seen sitting up in bed this morning.  She states she slept well overnight.  She is more alert and oriented this morning, but still has some language of confusion regarding conversation with her daughter yesterday.  ROS: Denies SOB, CP, N/V/D.   Objective:   No results found. Recent Labs    02/26/19 0604  WBC 7.1  HGB 12.6  HCT 40.1  PLT 244   Recent Labs    02/25/19 0648  NA 141  K 4.1  CL 109  CO2 24  GLUCOSE 91  BUN 27*  CREATININE 1.31*  CALCIUM 8.9    Intake/Output Summary (Last 24 hours) at 02/27/2019 1213 Last data filed at 02/27/2019 0755 Gross per 24 hour  Intake 2118.46 ml  Output -  Net 2118.46 ml     Physical Exam: Vital Signs Blood pressure (!) 165/58, pulse 63, temperature 97.6 F (36.4 C), temperature source Oral, resp. rate 18, height 5\' 7"  (1.702 m), weight 104.8 kg, SpO2 99 %. Constitutional: No distress . Vital signs reviewed.  Morbidly obese. HENT: Normocephalic.  Atraumatic. Eyes: EOMI. No discharge. Cardiovascular: No JVD. Respiratory: Normal effort.  No stridor. GI: Non-distended. Skin: Warm and dry.  Intact. Psych: Tearful. Musc: Lower extremity edema  Neurological:Alert and oriented x3, but still some confusion Motor: LUE/LLE: 4-4+/5 proximal to distal RUE/RLE: 5/5 proximal distal, unchanged  Assessment/Plan: 1. Functional deficits secondary to Left hemiparesis with dysphagia/dysarthriasecondary to right watershed territory infarction in the setting of severe stenosis right M1 segment status post revascularization which require 3+ hours per day of interdisciplinary therapy in a comprehensive inpatient rehab setting.  Physiatrist is providing close team supervision and 24 hour management of active medical problems listed below.  Physiatrist and rehab team continue to assess barriers to discharge/monitor patient progress toward functional  and medical goals  Care Tool:  Bathing  Bathing activity did not occur: Safety/medical concerns Body parts bathed by patient: Right arm, Left arm, Chest, Abdomen, Front perineal area, Buttocks, Right upper leg, Left upper leg, Right lower leg, Left lower leg, Face   Body parts bathed by helper: Right lower leg, Left lower leg     Bathing assist Assist Level: Contact Guard/Touching assist     Upper Body Dressing/Undressing Upper body dressing   What is the patient wearing?: Bra, Pull over shirt    Upper body assist Assist Level: Minimal Assistance - Patient > 75%    Lower Body Dressing/Undressing Lower body dressing      What is the patient wearing?: Incontinence brief, Pants     Lower body assist Assist for lower body dressing: Moderate Assistance - Patient 50 - 74%     Toileting Toileting    Toileting assist Assist for toileting: Maximal Assistance - Patient 25 - 49%     Transfers Chair/bed transfer  Transfers assist     Chair/bed transfer assist level: Moderate Assistance - Patient 50 - 74%     Locomotion Ambulation   Ambulation assist   Ambulation activity did not occur: Safety/medical concerns  Assist level: Moderate Assistance - Patient 50 - 74% Assistive device: Walker-rolling Max distance: 20 ft   Walk 10 feet activity   Assist  Walk 10 feet activity did not occur: Safety/medical concerns  Assist level: Moderate Assistance - Patient - 50 - 74% Assistive device: Walker-rolling   Walk 50 feet activity   Assist Walk 50 feet with 2 turns activity did not occur: Safety/medical concerns  Walk 150 feet activity   Assist Walk 150 feet activity did not occur: Safety/medical concerns         Walk 10 feet on uneven surface  activity   Assist Walk 10 feet on uneven surfaces activity did not occur: Safety/medical concerns         Wheelchair     Assist Will patient use wheelchair at discharge?: Yes Type of Wheelchair:  Manual Wheelchair activity did not occur: Safety/medical concerns  Wheelchair assist level: Moderate Assistance - Patient 50 - 74% Max wheelchair distance: 31'    Wheelchair 50 feet with 2 turns activity    Assist    Wheelchair 50 feet with 2 turns activity did not occur: Safety/medical concerns       Wheelchair 150 feet activity     Assist  Wheelchair 150 feet activity did not occur: Safety/medical concerns       Blood pressure (!) 165/58, pulse 63, temperature 97.6 F (36.4 C), temperature source Oral, resp. rate 18, height 5\' 7"  (1.702 m), weight 104.8 kg, SpO2 99 %.  Medical Problem List and Plan: 1.Left hemiplegia with dysphagia/dysarthriasecondary to right watershed territory infarction in the setting of severe stenosis right M1 segment status post revascularization as well as small left lateral posterior temporal lobe infarct in right middle cerebellar peduncle infarct  Continue CIR 2. Antithrombotics: -DVT/anticoagulation:Subcutaneous heparin -antiplatelet therapy: Aspirin 81 mg daily, Brilinta 90 mg twice daily 3. Pain Management:Tylenol as needed  Added voltaren gel for R foot 4. Mood:Seroquel 12.5 mg nightly DC'd on 10/18   ?  Component of pseudodementia-we will start fluoxetine.  Will request neuropsych eval -antipsychotic agents: N/A 5. Neuropsych: She is not fully capable of making decisions onherown behalf. 6. Skin/Wound Care:Routine skin checks 7. Fluids/Electrolytes/Nutrition:Routine in and outs 8. Post strokedysphagia.   D2 nectars, advanced to D2 thins  Encourage fluids  Advance diet as tolerated  9. Hypertension.   Maxide 37.5-25 mg daily DC'd on 10/14 due to AKI  Continue clonidine patch 0.1 mg change every 7 days, Zebeta 2.5 mg daily.   Vitals:   02/26/19 2210 02/27/19 0335  BP: (!) 160/52 (!) 165/58  Pulse: 60 63  Resp:  18  Temp:  97.6 F (36.4 C)  SpO2:  99%   Elevated on 10/18,  likely secondary to IV fluids as well as discontinuation of Maxide.  We will plan to adjust meds based on labs tomorrow 10. Hyperlipidemia. Lipitor 11. Hypothyroidism. Synthroid  TSH ordered for tomorrow 12. R foot pain- Mid foot OA:?  Resolved 13.  Leukocytosis: Resolved  WBC 7.1 on 10/17  Afebrile  Continue to monitor 14.  AKI with acute lower UTI  Creatinine 1.31 on 10/16, labs ordered for tomorrow  Echo reviewed-EF 65-70% with hyperdynamic left ventricle  IVF changed to nightly on 10/15  Ucx Enterococcus Faecalis-started on ampicillin on 10/16, discussed abx with pharmacy-unclear how patient was started on antibiotics and therefore likely invalidating repeat culture.  Discussed with pharmacy - information relayed.  We will continue to monitor efficacy of antibiotics now that she has been started on them. 15.  Hyperglycemia  Improving on 10/15, labs ordered for tomorrow 16.  Metabolic encephalopathy  See #14  Slowly improving  LOS: 16 days A FACE TO FACE EVALUATION WAS PERFORMED  Geddy Boydstun [name] 02/27/2019, 12:13 PM

## 2019-02-27 NOTE — Progress Notes (Signed)
Physical Therapy Session Note  Patient Details  Name: Christy Carter MRN: [id number] Date of Birth: 1933-09-30  Today's Date: 02/27/2019 PT Individual Time: 0805-0915 PT Individual Time Calculation (min): 70 min   Short Term Goals: Week 3:  PT Short Term Goal 1 (Week 3): Pt will perform supine<>sit with CGA PT Short Term Goal 2 (Week 3): Pt will perform sit<>stand with min assist consistently PT Short Term Goal 3 (Week 3): Pt will perform bed<>chair transfers using LRAD with min assist PT Short Term Goal 4 (Week 3): Pt will ambulate at least 49ft using LRAD with min assist  Skilled Therapeutic Interventions/Progress Updates:    Pt supine in bed upon PT arrival, agreeable to therapy tx and denies pain. Pt becomes tearful this morning and reports "I don't know what I did to become crippled like this" therapist provided encouragement and emotional support. Pt transferred to sitting EOB with min assist this session, seated EOB donned pants and performed sit<>stand max assist with RW to pull pants over hips. Seated EOB pt donned shirt with supervision. Pt performed stand pivot to w/c with mod assist and RW, transported to the gym. Pt performed stand pivot this session to nustep with mod assist and RW. Pt used nustep x 8 minutes on workload 5 for global strengthening and endurance using B UEs/LEs. Pt ambulated x 20 ft and x 15 ft this session with RW and mod assist, cues for upright posture and increased step length. Pt worked on dynamic balance and foot clearance to perform toe taps this session on aerobic step, UE support with RW and min-mod assist, 2 x 5 per LE. Pt performed 2 x 5 sit<>stands from edge of mat without AD working on LE strengthening, min assist and facilitation for full hip extension in standing. Pt transferred sit>R sidelying>supine with min assist this session. In supine pt performed 2 x 10 bridges for hip strengthening, performed L LE hip abduction x 10 and therapist performed supine  hip flexor stretch. Pt transferred to sidelying with min assist, performed hip abduction clamshells for strengthening x 10 . Pt transferred to sitting with min assist. Pt transported back to room and left in w/c with needs in reach and chair alarm set, therapist set up church service on the computer for pt.   Therapy Documentation Precautions:  Precautions Precautions: Fall, Other (comment) Precaution Comments: L inattention, left hemiparesis Restrictions Weight Bearing Restrictions: No    Therapy/Group: Individual Therapy   Netta Corrigan, PT, DPT, CSRS 02/27/19  7:51 AM    [place] 02/27/2019, 7:51 AM

## 2019-02-27 NOTE — Plan of Care (Signed)
  Problem: Consults Goal: RH STROKE PATIENT EDUCATION Description: See Patient Education module for education specifics  Outcome: Progressing   Problem: RH BOWEL ELIMINATION Goal: RH STG MANAGE BOWEL WITH ASSISTANCE Description: STG Manage Bowel with min Assistance. Outcome: Progressing   Problem: RH BLADDER ELIMINATION Goal: RH STG MANAGE BLADDER WITH ASSISTANCE Description: STG Manage Bladder With min  Assistance Outcome: Progressing   Problem: RH SAFETY Goal: RH STG ADHERE TO SAFETY PRECAUTIONS W/ASSISTANCE/DEVICE Description: STG Adhere to Safety Precautions With Cues and reminders. Outcome: Progressing   Problem: RH KNOWLEDGE DEFICIT Goal: RH STG INCREASE KNOWLEDGE OF HYPERTENSION Description: Daughter will be able to demonstrate management of htn using handouts and reading resources for medications , bp monitoring and dietary independently. Outcome: Progressing Goal: RH STG INCREASE KNOWLEGDE OF HYPERLIPIDEMIA Description: Daughter will be able to demonstrate management of hld using handouts and reading resources for medications and dietary independently. Outcome: Progressing Goal: RH STG INCREASE KNOWLEDGE OF STROKE PROPHYLAXIS Description: Daughter will be able to demonstrate management of stroke using handouts and reading resources for medications , bp monitoring and dietary restrictions independently. Outcome: Progressing   Problem: RH KNOWLEDGE DEFICIT Goal: RH STG INCREASE KNOWLEDGE OF DYSPHAGIA/FLUID INTAKE Description: Daughter will be able to demonstrate management of dysphagia using handouts and reading resources for medications dietary restrictions independently. Outcome: Not Applicable

## 2019-02-28 ENCOUNTER — Inpatient Hospital Stay (HOSPITAL_COMMUNITY): Payer: Medicare Other | Admitting: Speech Pathology

## 2019-02-28 ENCOUNTER — Inpatient Hospital Stay (HOSPITAL_COMMUNITY): Payer: Medicare Other | Admitting: Occupational Therapy

## 2019-02-28 ENCOUNTER — Inpatient Hospital Stay (HOSPITAL_COMMUNITY): Payer: Medicare Other | Admitting: Physical Therapy

## 2019-02-28 LAB — BASIC METABOLIC PANEL
Anion gap: 10 (ref 5–15)
BUN: 13 mg/dL (ref 8–23)
CO2: 23 mmol/L (ref 22–32)
Calcium: 9.3 mg/dL (ref 8.9–10.3)
Chloride: 107 mmol/L (ref 98–111)
Creatinine, Ser: 1.18 mg/dL — ABNORMAL HIGH (ref 0.44–1.00)
GFR calc Af Amer: 49 mL/min — ABNORMAL LOW (ref >60)
GFR calc non Af Amer: 43 mL/min — ABNORMAL LOW (ref >60)
Glucose, Bld: 133 mg/dL — ABNORMAL HIGH (ref 70–99)
Potassium: 4.2 mmol/L (ref 3.5–5.1)
Sodium: 140 mmol/L (ref 135–145)

## 2019-02-28 LAB — TSH: TSH: 17.492 u[IU]/mL — ABNORMAL HIGH (ref 0.350–4.500)

## 2019-02-28 MED ORDER — ONDANSETRON HCL 4 MG PO TABS
4.0000 mg | ORAL_TABLET | Freq: Three times a day (TID) | ORAL | Status: DC | PRN
  Administered 2019-02-28: 4 mg via ORAL
  Filled 2019-02-28: qty 1

## 2019-02-28 NOTE — Progress Notes (Signed)
Physical Therapy Session Note  Patient Details  Name: Christy Carter MRN: [id number] Date of Birth: 10/23/1933  Today's Date: 02/28/2019 PT Individual Time: 1300-1416 PT Individual Time Calculation (min): 76 min   Short Term Goals: Week 2:  PT Short Term Goal 1 (Week 2): Pt wil perform supine<>sit with min assist PT Short Term Goal 1 - Progress (Week 2): Met PT Short Term Goal 2 (Week 2): Pt will perform sit<>stand using LRAD with min assist, from chair height PT Short Term Goal 2 - Progress (Week 2): Progressing toward goal PT Short Term Goal 3 (Week 2): Pt will perform bed<>chair transfer using LRAD with mod assist of 1 PT Short Term Goal 3 - Progress (Week 2): Met PT Short Term Goal 4 (Week 2): Pt will ambulate at least 3f with max assist using LRAD PT Short Term Goal 4 - Progress (Week 2): Met Week 3:  PT Short Term Goal 1 (Week 3): Pt will perform supine<>sit with CGA PT Short Term Goal 2 (Week 3): Pt will perform sit<>stand with min assist consistently PT Short Term Goal 3 (Week 3): Pt will perform bed<>chair transfers using LRAD with min assist PT Short Term Goal 4 (Week 3): Pt will ambulate at least 347fusing LRAD with min assist  Skilled Therapeutic Interventions/Progress Updates:    Received pt supine in bed eating lunch with NT present. PT took over with supervision with eating. Pt agreeable to PT and denied any pain during today's session. Session focused on functional transfers/mobility, ambulation, LE strength, balance, and improved tolerance to activity. Pt reported feeling nauseous at beginning of session and RN reports getting nausea medication ordered by MD. Pt continues to remain confused and required direction for re-orientation. Pt performed bed mobility with min A with HOB elevated. Pt transferred stand pivot with RW to WC mod A with max verbal cues for hand placement, step sequence, RW safety, and foot placement when turning. Pt performed 8 sit<>stands with RW min A  with mat elevated with verbal cues to push up from mat instead of pulling on RW. Pt ambulated 2585fith RW mod A with verbal cues for step sequence, RW safety, and hand placement on RW. In tall kneeling pt performed bilateral hip extension x 5 reps with verbal and tactile cues for elbow extension, hip extension, and glute activation using mirror for visual feedback. Pt performed side stepping to L x 10 reps with RW along mat min A with max verbal cues for step sequence, RW safety, foot placement, and required assistance from therapist to move RW. On Kinetron pt performed seated LE strengthening 2x20 on resistance 40 and 1x20 on resistance 20 and was encouraged to count out loud for cognitive challenge. Concluded session with pt sitting in WC, needs within reach, seatbelt alarm on but out of batteries. NT made aware.   Therapy Documentation Precautions:  Precautions Precautions: Fall, Other (comment) Precaution Comments: L inattention, left hemiparesis Restrictions Weight Bearing Restrictions: No  Therapy/Group: Individual Therapy  [place], DPT   [place] SpiroT, DPT  02/28/2019, 1:36 PM

## 2019-02-28 NOTE — Progress Notes (Signed)
Speech Language Pathology Daily Session Note  Patient Details  Name: Christy Carter MRN: [id number] Date of Birth: [birth year]  Today's Date: 02/28/2019 SLP Individual Time: 0725-0823 SLP Individual Time Calculation (min): 58 min  Short Term Goals: Week 3: SLP Short Term Goal 1 (Week 3): Pt will consume dysphagia 2 diet with thin liquids and superivison level cues for use of compensatory swallow strategies. SLP Short Term Goal 2 (Week 3): Pt will consume trials of dysphagia 3 with Min A cues for use of compensatory swallow strategies. SLP Short Term Goal 3 (Week 3): Pt will locate items to the left of midline in >25% of opportunities during basic, familiar tasks with mod assist multimodal cues. SLP Short Term Goal 4 (Week 3): Pt will utilize external aids to recall basic, daily information with mod assist verbal cues. SLP Short Term Goal 5 (Week 3): Pt will complete basic, familiar tasks wtih mod assist multimodal cues for functional problem solving. SLP Short Term Goal 6 (Week 3): Pt will sustain attention to basic familiar task for 15 minutes with Mod A cues for redirection to task.  Skilled Therapeutic Interventions: Pt was seen for skilled ST targeting dysphagia and cognitive goals. SLP provided skilled observation of pt consuming current diet, dys 2 solid/thin liquid breakfast items, as well as upgraded dys 3 solid trials. Pt required Min A verbal cues to utilize swallow strategies for complete oral clearance of left buccal pocketing with both dys 2 and dys 3 textures. No overt s/s were observed throughout intake of solids of liquids. Min A verbal cues also provided for locating items on left side of tray as well as basic problem solving (ex: how to reposition set up to feel less claustrophobic in bed). Pt oriented X4 with Supervision A verbal cues to use calendar to determine date. During a functional weekly weather forecast reading task, pt identified/interpreted key information with Min A for  scanning page left to right; Min-Mod A verbal cues provided for basic problem solving during with subtraction in order for pt determine temperature differences. Pt was more distracted by moderate hallway activity/noise, thus Min A cues for redirection required for pt to selectively attend to functional and structured tasks for ~20 minute intervals. Pt was left in bed with alarm set and all needs within reach. Continue per current plan of care.      Pain Pain Assessment Pain Score: 0-No pain  Therapy/Group: Individual Therapy  [name] 02/28/2019, 8:25 AM

## 2019-02-28 NOTE — Progress Notes (Signed)
Occupational Therapy Session Note  Patient Details  Name: Christy Carter MRN: [id number] Date of Birth: 12-May-1934  Today's Date: 02/28/2019 OT Individual Time: 0945-1100 OT Individual Time Calculation (min): 75 min    Short Term Goals: Week 1:  OT Short Term Goal 1 (Week 1): Pt will don shirt with mod A OT Short Term Goal 1 - Progress (Week 1): Met OT Short Term Goal 2 (Week 1): Pt will maintain static EOB sitting balance with min A while completing ADL task OT Short Term Goal 2 - Progress (Week 1): Met OT Short Term Goal 3 (Week 1): Pt will complete LB bathing bed level with LRAD with mod A OT Short Term Goal 3 - Progress (Week 1): Met OT Short Term Goal 4 (Week 1): Pt will require only mod cueing for attending to L side of body during UB bathing OT Short Term Goal 4 - Progress (Week 1): Met Week 2:  OT Short Term Goal 1 (Week 2): Pt will be able to rise to stand to RW with min A of 1 to prepare for LB self care. OT Short Term Goal 1 - Progress (Week 2): Progressing toward goal(Pt can rise from elevated surface with min A (bed, toilet) but needs mod A from w/c) OT Short Term Goal 2 (Week 2): Pt will be able to stand with mod A for dynamic balance as she pulls pants over hips with min A. OT Short Term Goal 2 - Progress (Week 2): Met OT Short Term Goal 3 (Week 2): Min A squat or stand pivot to toilet. OT Short Term Goal 3 - Progress (Week 2): Progressing toward goal(Pt can stand pivot to toilet with bar with mod A.) OT Short Term Goal 4 (Week 2): Pt will don shirt with min A. OT Short Term Goal 4 - Progress (Week 2): Met OT Short Term Goal 5 (Week 2): Pt will don pants with min A. OT Short Term Goal 5 - Progress (Week 2): Met Week 3:  OT Short Term Goal 1 (Week 3): Pt will be able to sit to stand from w/c with min A to prepare for ADL transfers. OT Short Term Goal 2 (Week 3): Pt will be able to maintain dynamic stand with min A as she pulls pants over her hips. OT Short Term Goal 3  (Week 3): Pt will be able to toilet with min A. OT Short Term Goal 4 (Week 3): Pt will demonstrate improved L side awareness and praxis to stand pivot transfer with min A to her L with only min cues.      Skilled Therapeutic Interventions/Progress Updates:    Pt received in w/c stating she was feeling nauseated from her morning meds.  Pt did not want to bathe today but agreeable to dressing.  She continues to need mod cues at times to fully attend to L side when donning clothing with hemidressing techniques.  She is developing more LLE motor control and can now cross her legs which enables her to don her pants more easily.    Worked on sit to stand to RW with pushing up with B hands from w/c to walker with mod A from low w/c. In standing she was able to hold balance for several minutes with min A as she used L hand to actively pull pants over hips with only slight A.   In standing, wt shifts L/R and stepping L foot out to side with mod A.  Impaired motor control of LLE  with abduction.   In sitting, resisted hip abduction with orange theraband.  UE strengthening with resisted dowel bar pulls.  Hand strengthening with soft yellow putty.    Pt resting in w/c with belt alarm on and all needs met.     Therapy Documentation Precautions:  Precautions Precautions: Fall, Other (comment) Precaution Comments: L inattention, left hemiparesis Restrictions Weight Bearing Restrictions: No    Vital Signs: Therapy Vitals Temp: 98.2 F (36.8 C) Temp Source: Oral Pulse Rate: 65 Resp: 19 BP: (!) 168/50 Patient Position (if appropriate): Lying Oxygen Therapy SpO2: 95 % O2 Device: Room Air Pain: Pain Assessment Pain Scale: 0-10 Pain Score: 0-No pain   Therapy/Group: Individual Therapy  [place] 02/28/2019, 9:07 AM

## 2019-02-28 NOTE — Progress Notes (Signed)
[facility] PHYSICAL MEDICINE & REHABILITATION PROGRESS NOTE  Subjective/Complaints: Patient reports no issues this AM except she doesn't like that hospital TV remote isn't like hers at home. It bothers her a lot.   ROS: Denies SOB, CP, N/V/D.   Objective:   No results found. Recent Labs    02/26/19 0604  WBC 7.1  HGB 12.6  HCT 40.1  PLT 244   No results for input(s): NA, K, CL, CO2, GLUCOSE, BUN, CREATININE, CALCIUM in the last 72 hours.  Intake/Output Summary (Last 24 hours) at 02/28/2019 1534 Last data filed at 02/28/2019 1435 Gross per 24 hour  Intake 1980.32 ml  Output 1 ml  Net 1979.32 ml     Physical Exam: Vital Signs Blood pressure (!) 122/51, pulse 64, temperature 98.6 F (37 C), temperature source Oral, resp. rate 18, height 5\' 7"  (1.702 m), weight 104.8 kg, SpO2 90 %. Constitutional: No distress . Vital signs reviewed.Sitting up in manual w/c at bedside; wearing eyeglasses;  Morbidly obese. HENT: Normocephalic.  Atraumatic. Eyes: EOMI. No discharge. Cardiovascular: No JVD. Respiratory: Normal effort.  No stridor. GI: Non-distended. Skin: Warm and dry.  Intact. Psych: frustrated Musc: Lower extremity edema  Neurological:Alert and oriented x3, but less confusion Motor: LUE/LLE: 4-4+/5 proximal to distal RUE/RLE: 5/5 proximal distal, unchanged  Assessment/Plan: 1. Functional deficits secondary to Left hemiparesis with dysphagia/dysarthriasecondary to right watershed territory infarction in the setting of severe stenosis right M1 segment status post revascularization which require 3+ hours per day of interdisciplinary therapy in a comprehensive inpatient rehab setting.  Physiatrist is providing close team supervision and 24 hour management of active medical problems listed below.  Physiatrist and rehab team continue to assess barriers to discharge/monitor patient progress toward functional and medical goals  Care Tool:  Bathing  Bathing activity did  not occur: Safety/medical concerns Body parts bathed by patient: Right arm, Left arm, Chest, Abdomen, Front perineal area, Buttocks, Right upper leg, Left upper leg, Right lower leg, Left lower leg, Face   Body parts bathed by helper: Right lower leg, Left lower leg     Bathing assist Assist Level: Contact Guard/Touching assist     Upper Body Dressing/Undressing Upper body dressing   What is the patient wearing?: Bra, Pull over shirt    Upper body assist Assist Level: Minimal Assistance - Patient > 75%    Lower Body Dressing/Undressing Lower body dressing      What is the patient wearing?: Incontinence brief, Pants     Lower body assist Assist for lower body dressing: Moderate Assistance - Patient 50 - 74%     Toileting Toileting    Toileting assist Assist for toileting: Maximal Assistance - Patient 25 - 49%     Transfers Chair/bed transfer  Transfers assist     Chair/bed transfer assist level: Moderate Assistance - Patient 50 - 74%     Locomotion Ambulation   Ambulation assist   Ambulation activity did not occur: Safety/medical concerns  Assist level: Moderate Assistance - Patient 50 - 74% Assistive device: Walker-rolling Max distance: 36ft   Walk 10 feet activity   Assist  Walk 10 feet activity did not occur: Safety/medical concerns  Assist level: Moderate Assistance - Patient - 50 - 74% Assistive device: Walker-rolling   Walk 50 feet activity   Assist Walk 50 feet with 2 turns activity did not occur: Safety/medical concerns         Walk 150 feet activity   Assist Walk 150 feet activity did not occur: Safety/medical  concerns         Walk 10 feet on uneven surface  activity   Assist Walk 10 feet on uneven surfaces activity did not occur: Safety/medical concerns         Wheelchair     Assist Will patient use wheelchair at discharge?: Yes Type of Wheelchair: Manual Wheelchair activity did not occur: Safety/medical  concerns  Wheelchair assist level: Moderate Assistance - Patient 50 - 74% Max wheelchair distance: 56'    Wheelchair 50 feet with 2 turns activity    Assist    Wheelchair 50 feet with 2 turns activity did not occur: Safety/medical concerns       Wheelchair 150 feet activity     Assist  Wheelchair 150 feet activity did not occur: Safety/medical concerns       Blood pressure (!) 122/51, pulse 64, temperature 98.6 F (37 C), temperature source Oral, resp. rate 18, height 5\' 7"  (1.702 m), weight 104.8 kg, SpO2 90 %.  Medical Problem List and Plan: 1.Left hemiplegia with dysphagia/dysarthriasecondary to right watershed territory infarction in the setting of severe stenosis right M1 segment status post revascularization as well as small left lateral posterior temporal lobe infarct in right middle cerebellar peduncle infarct  Continue CIR 2. Antithrombotics: -DVT/anticoagulation:Subcutaneous heparin -antiplatelet therapy: Aspirin 81 mg daily, Brilinta 90 mg twice daily 3. Pain Management:Tylenol as needed  Added voltaren gel for R foot 4. Mood:Seroquel 12.5 mg nightly DC'd on 10/18   ?  Component of pseudodementia-we will start fluoxetine.  Will request neuropsych eval -antipsychotic agents: N/A 5. Neuropsych: She is not fully capable of making decisions onherown behalf. 6. Skin/Wound Care:Routine skin checks 7. Fluids/Electrolytes/Nutrition:Routine in and outs 8. Post strokedysphagia.   D2 nectars, advanced to D2 thins  Encourage fluids  Advance diet as tolerated  9. Hypertension.   Maxide 37.5-25 mg daily DC'd on 10/14 due to AKI  Continue clonidine patch 0.1 mg change every 7 days, Zebeta 2.5 mg daily.   Vitals:   02/28/19 0518 02/28/19 1427  BP: (!) 168/50 (!) 122/51  Pulse: 65 64  Resp: 19 18  Temp: 98.2 F (36.8 C) 98.6 F (37 C)  SpO2: 95% 90%   Elevated on 10/18, likely secondary to IV fluids as well as  discontinuation of Maxide.  We will plan to adjust meds based on labs tomorrow 10. Hyperlipidemia. Lipitor 11. Hypothyroidism. Synthroid  TSH ordered for tomorrow 12. R foot pain- Mid foot OA:?  Resolved 13.  Leukocytosis: Resolved  WBC 7.1 on 10/17  Afebrile  Continue to monitor 14.  AKI with acute lower UTI  Creatinine 1.31 on 10/16, labs ordered for tomorrow  Echo reviewed-EF 65-70% with hyperdynamic left ventricle  IVF changed to nightly on 10/15  Ucx Enterococcus Faecalis-started on ampicillin on 10/16, discussed abx with pharmacy-unclear how patient was started on antibiotics and therefore likely invalidating repeat culture.  Discussed with pharmacy - information relayed.  We will continue to monitor efficacy of antibiotics now that she has been started on them.  10/19- NO LABS completed as of 3:30 pm- cannot make changes without labs to determine course of action. Ordered again for tomorrow. 15.  Hyperglycemia  Improving on 10/15, labs ordered for tomorrow 16.  Metabolic encephalopathy  See #14  Slowly improving  LOS: 17 days A FACE TO FACE EVALUATION WAS PERFORMED  Lucan Riner 02/28/2019, 3:34 PM

## 2019-02-28 NOTE — Progress Notes (Signed)
Patient c/o of nausea ,no active vomitus.  Helyn Numbers, PA made aware and new order put in. Patient made aware and order initiated. Will continue to monitor

## 2019-03-01 ENCOUNTER — Inpatient Hospital Stay (HOSPITAL_COMMUNITY): Payer: Medicare Other | Admitting: Physical Therapy

## 2019-03-01 ENCOUNTER — Inpatient Hospital Stay (HOSPITAL_COMMUNITY): Payer: Medicare Other | Admitting: Speech Pathology

## 2019-03-01 ENCOUNTER — Inpatient Hospital Stay (HOSPITAL_COMMUNITY): Payer: Medicare Other | Admitting: Occupational Therapy

## 2019-03-01 LAB — BASIC METABOLIC PANEL
Anion gap: 10 (ref 5–15)
BUN: 9 mg/dL (ref 8–23)
CO2: 23 mmol/L (ref 22–32)
Calcium: 9.4 mg/dL (ref 8.9–10.3)
Chloride: 109 mmol/L (ref 98–111)
Creatinine, Ser: 1.14 mg/dL — ABNORMAL HIGH (ref 0.44–1.00)
GFR calc Af Amer: 51 mL/min — ABNORMAL LOW (ref >60)
GFR calc non Af Amer: 44 mL/min — ABNORMAL LOW (ref >60)
Glucose, Bld: 105 mg/dL — ABNORMAL HIGH (ref 70–99)
Potassium: 3.8 mmol/L (ref 3.5–5.1)
Sodium: 142 mmol/L (ref 135–145)

## 2019-03-01 MED ORDER — LEVOTHYROXINE SODIUM 75 MCG PO TABS
150.0000 ug | ORAL_TABLET | Freq: Every day | ORAL | Status: DC
  Administered 2019-03-03 – 2019-03-09 (×7): 150 ug via ORAL
  Filled 2019-03-01 (×9): qty 2

## 2019-03-01 NOTE — Progress Notes (Signed)
Physical Therapy Session Note  Patient Details  Name: Christy Carter MRN: [id number] Date of Birth: 05/22/1933  Today's Date: 03/01/2019 PT Individual Time: 0802-0907 PT Individual Time Calculation (min): 65 min   Short Term Goals: Week 2:  PT Short Term Goal 1 (Week 2): Pt wil perform supine<>sit with min assist PT Short Term Goal 1 - Progress (Week 2): Met PT Short Term Goal 2 (Week 2): Pt will perform sit<>stand using LRAD with min assist, from chair height PT Short Term Goal 2 - Progress (Week 2): Progressing toward goal PT Short Term Goal 3 (Week 2): Pt will perform bed<>chair transfer using LRAD with mod assist of 1 PT Short Term Goal 3 - Progress (Week 2): Met PT Short Term Goal 4 (Week 2): Pt will ambulate at least 42f with max assist using LRAD PT Short Term Goal 4 - Progress (Week 2): Met Week 3:  PT Short Term Goal 1 (Week 3): Pt will perform supine<>sit with CGA PT Short Term Goal 2 (Week 3): Pt will perform sit<>stand with min assist consistently PT Short Term Goal 3 (Week 3): Pt will perform bed<>chair transfers using LRAD with min assist PT Short Term Goal 4 (Week 3): Pt will ambulate at least 341fusing LRAD with min assist  Skilled Therapeutic Interventions/Progress Updates:   Received pt supine in bed, pt agreeable to PT and denied any pain. Pt appears more cognitively aware and was able to recall moving rooms last night but thought that it was a dream. Pt reports that she is tired and had a bad day yesterday. Session focused on bed mobility, functional transfers/mobility, LE strength, balance, ambulation, and improved tolerance to activity. Pt donned pants supine in bed mod A. Pt performed bed mobility with min A with HOB elevated. PT donned ted hose and shoes max A. Pt transferred to R from bed<>WC stand pivot with RW mod A and verbal cues for step sequence, foot placement, and hand placement on RW. [facility]<>stands from elevated mat  min A with B UE handheld support for balance 2x6 with verbal and tactile cues for hip extension and glute activation using mirror for visual feedback. While standing with UE support on RW, pt reached to L and threw horseshoes with CGA 3x6 horseshoes with seated rest breaks in between targeting dynamic standing balance, L attention, and standing activity tolerance. Pt ambulated 1530fsing RW with mod assist and max verbal cues for RW advancement, step sequence, foot placement, and to remain upright - +2 assist for w/c follow. Pt transferred stand<>pivot to/from WC<>toilet mod A with bilateral UE support on grab bars and verbal cues for step sequence, foot placement, and hand placement. While standing with min/mod assist for balance, pt required total assist for hygiene and clothing management via +2 assist. Pt noted to be incontinent of bladder in brief and when asked if pt knew that brief was wet pt reported that she was unaware. Concluded session with pt sitting in WC, needs within reach, and seatbelt alarm on.   Therapy Documentation Precautions:  Precautions Precautions: Fall, Other (comment) Precaution Comments: L inattention, left hemiparesis Restrictions Weight Bearing Restrictions: No   Therapy/Group: Individual Therapy  AnnWisdom, DPT   CarPage SpiroT, DPT  03/01/2019, 7:53 AM

## 2019-03-01 NOTE — Progress Notes (Signed)
Speech Language Pathology Daily Session Note  Patient Details  Name: Christy Carter MRN: [id number] Date of Birth: 1933/06/04  Today's Date: 03/01/2019 SLP Individual Time: [id number] SLP Individual Time Calculation (min): 59 min  Short Term Goals: Week 3: SLP Short Term Goal 1 (Week 3): Pt will consume dysphagia 2 diet with thin liquids and superivison level cues for use of compensatory swallow strategies. SLP Short Term Goal 2 (Week 3): Pt will consume trials of dysphagia 3 with Min A cues for use of compensatory swallow strategies. SLP Short Term Goal 3 (Week 3): Pt will locate items to the left of midline in >25% of opportunities during basic, familiar tasks with mod assist multimodal cues. SLP Short Term Goal 4 (Week 3): Pt will utilize external aids to recall basic, daily information with mod assist verbal cues. SLP Short Term Goal 5 (Week 3): Pt will complete basic, familiar tasks wtih mod assist multimodal cues for functional problem solving. SLP Short Term Goal 6 (Week 3): Pt will sustain attention to basic familiar task for 15 minutes with Mod A cues for redirection to task.  Skilled Therapeutic Interventions: Pt was seen for skilled ST targeting dysphagia and cognitive goals. Pt's daughter was present at bedside throughout session. SLP provided upgraded trial of dysphagia 3 (mechanical soft) solid with thin liquid. Pt used swallow strategies for complete oral clearance of solids with Min A verbal cues. No left buccal pocketing or overt s/s aspiration observed intake. Recommend continue at least 1 additional Dys 3 trial (preferably with full size meal) prior to diet advancement - continue current Dys 2, thin liquid diet for now. SLP further facilitated session with Max A faded to Mod A verbal and visual cues for pt to use external aids in order to orient to place, month, and date. Pt also matched colored blocks to a 6-color pattern board with Mod A verbal and visual cues for error awareness  and locating colors at and to left of midline. Pt has been with increased confusion since last PM and demonstrated increasing fatigue throughout ST session, believed to partially impact pt's performance on structured tasks today. She also became tearful when expressing her concern regarding recent behavioral incidents that occur in moments of confusion. SLP provided emotional support and emphasized compensatory strategies and use of external aids to re-orient herself. Pt left sitting in wheelchair with seatbelt alarm engaged and daughter still present. Continue per current plan of care.      Pain Pain Assessment Pain Score: 0-No pain  Therapy/Group: Individual Therapy  [name] 03/01/2019, 2:03 PM
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
## 2019-03-01 NOTE — Progress Notes (Signed)
Occupational Therapy Session Note  Patient Details  Name: Christy Carter MRN: [id number] Date of Birth: [birth year]  Today's Date: 03/01/2019 OT Individual Time: 0945-1100 OT Individual Time Calculation (min): 75 min    Short Term Goals: Week 1:  OT Short Term Goal 1 (Week 1): Pt will don shirt with mod A OT Short Term Goal 1 - Progress (Week 1): Met OT Short Term Goal 2 (Week 1): Pt will maintain static EOB sitting balance with min A while completing ADL task OT Short Term Goal 2 - Progress (Week 1): Met OT Short Term Goal 3 (Week 1): Pt will complete LB bathing bed level with LRAD with mod A OT Short Term Goal 3 - Progress (Week 1): Met OT Short Term Goal 4 (Week 1): Pt will require only mod cueing for attending to L side of body during UB bathing OT Short Term Goal 4 - Progress (Week 1): Met Week 2:  OT Short Term Goal 1 (Week 2): Pt will be able to rise to stand to RW with min A of 1 to prepare for LB self care. OT Short Term Goal 1 - Progress (Week 2): Progressing toward goal(Pt can rise from elevated surface with min A (bed, toilet) but needs mod A from w/c) OT Short Term Goal 2 (Week 2): Pt will be able to stand with mod A for dynamic balance as she pulls pants over hips with min A. OT Short Term Goal 2 - Progress (Week 2): Met OT Short Term Goal 3 (Week 2): Min A squat or stand pivot to toilet. OT Short Term Goal 3 - Progress (Week 2): Progressing toward goal(Pt can stand pivot to toilet with bar with mod A.) OT Short Term Goal 4 (Week 2): Pt will don shirt with min A. OT Short Term Goal 4 - Progress (Week 2): Met OT Short Term Goal 5 (Week 2): Pt will don pants with min A. OT Short Term Goal 5 - Progress (Week 2): Met Week 3:  OT Short Term Goal 1 (Week 3): Pt will be able to sit to stand from w/c with min A to prepare for ADL transfers. OT Short Term Goal 2 (Week 3): Pt will be able to maintain dynamic stand with min A as she pulls pants over her hips. OT Short Term Goal 3  (Week 3): Pt will be able to toilet with min A. OT Short Term Goal 4 (Week 3): Pt will demonstrate improved L side awareness and praxis to stand pivot transfer with min A to her L with only min cues.  Skilled Therapeutic Interventions/Progress Updates:    Pt received in w/c and declined bathing or changing clothing. She wanted to wait for her daughter to bring more clothing. Pt agreeable to therapy.   Pt taken to gym and worked on a squat pivot transfer to mat with mod A.  On mat worked on visual scanning to Left, reaching with LUE to various targets (with 10% cues), holding onto a ball and then a hula hoop with B hands with mod cues due to L inattention with R hand involved.  Sit to stand with a narrow bench placed between pt's legs to encourage wide base of support. She reached forward with B hands to bench then pushed up from bench to full stand (with kyphotic posture) without UE support with only CGA. Pt very pleased with her ability to do this and wanted to repeat the exercise for 10 more times!  In standing worked on  trunk elongation placing objects overhead.   Excellent physical participation today BUT pt was confused.  She kept thinking she went home last night, and talking about things that did not make sense.  Reoriented pt numerous times.   Pt taken back to room with all needs met. Belt alarm on.   Therapy Documentation Precautions:  Precautions Precautions: Fall, Other (comment) Precaution Comments: L inattention, left hemiparesis Restrictions Weight Bearing Restrictions: No  Pain: Pain Assessment Pain Score: 0-No pain    Therapy/Group: Individual Therapy  [place] 03/01/2019, 12:19 PM

## 2019-03-01 NOTE — Progress Notes (Signed)
Patient is agitated and yelling in room.Patient is refusing all care at this time. She refused all medication for the nurse before and myself.I have called the on call Provider and she has requested that we try again to give medications and just monitor the patient. I have to tried administer medications again but she refused. Will continue to monitor  the patient.

## 2019-03-01 NOTE — Progress Notes (Signed)
Pt is still agitated tonight. Pt was somewhat fine at start of shift, but refused to cooperate when trying to administer meds. Pt refused this evening meds. Attempted to have pt void in [facility] but pt refused. Pt having hallucinations, and states the want to go home. Pt has bed alarm on, all 4 side rails up, and in bed. Will alert oncoming shift.

## 2019-03-01 NOTE — Progress Notes (Addendum)
[place] PHYSICAL MEDICINE & REHABILITATION PROGRESS NOTE  Subjective/Complaints: Patient seen laying in bed this morning.  She states she slept well overnight.  She feels her cognition is improving.  ROS: Denies SOB, CP, N/V/D.   Objective:   No results found. No results for input(s): WBC, HGB, HCT, PLT in the last 72 hours. Recent Labs    02/28/19 1521 03/01/19 0502  NA 140 142  K 4.2 3.8  CL 107 109  CO2 23 23  GLUCOSE 133* 105*  BUN 13 9  CREATININE 1.18* 1.14*  CALCIUM 9.3 9.4    Intake/Output Summary (Last 24 hours) at 03/01/2019 1013 Last data filed at 03/01/2019 0751 Gross per 24 hour  Intake 1360.89 ml  Output -  Net 1360.89 ml     Physical Exam: Vital Signs Blood pressure (!) 148/68, pulse 97, temperature 98 F (36.7 C), temperature source Oral, resp. rate 16, height 5\' 7"  (1.702 m), weight 104.8 kg, SpO2 99 %. Constitutional: No distress . Vital signs reviewed.morbidly obese. HENT: Normocephalic.  Atraumatic. Eyes: EOMI. No discharge. Cardiovascular: No JVD. Respiratory: Normal effort.  No stridor. GI: Non-distended. Skin: Warm and dry.  Intact. Psych: Normal mood.  Normal behavior. Musc: Lower extremity edema Neurological:Alert and oriented, except for date of month Motor: LUE/LLE: 4+/5 proximal to distal RUE/RLE: 5/5 proximal distal, stable  Assessment/Plan: 1. Functional deficits secondary to Left hemiparesis with dysphagia/dysarthriasecondary to right watershed territory infarction in the setting of severe stenosis right M1 segment status post revascularization which require 3+ hours per day of interdisciplinary therapy in a comprehensive inpatient rehab setting.  Physiatrist is providing close team supervision and 24 hour management of active medical problems listed below.  Physiatrist and rehab team continue to assess barriers to discharge/monitor patient progress toward functional and medical goals  Care Tool:  Bathing  Bathing  activity did not occur: Safety/medical concerns Body parts bathed by patient: Right arm, Left arm, Chest, Abdomen, Front perineal area, Buttocks, Right upper leg, Left upper leg, Right lower leg, Left lower leg, Face   Body parts bathed by helper: Right lower leg, Left lower leg     Bathing assist Assist Level: Contact Guard/Touching assist     Upper Body Dressing/Undressing Upper body dressing   What is the patient wearing?: Bra, Pull over shirt    Upper body assist Assist Level: Minimal Assistance - Patient > 75%    Lower Body Dressing/Undressing Lower body dressing      What is the patient wearing?: Incontinence brief, Pants     Lower body assist Assist for lower body dressing: Moderate Assistance - Patient 50 - 74%     Toileting Toileting    Toileting assist Assist for toileting: Maximal Assistance - Patient 25 - 49%     Transfers Chair/bed transfer  Transfers assist     Chair/bed transfer assist level: Moderate Assistance - Patient 50 - 74%     Locomotion Ambulation   Ambulation assist   Ambulation activity did not occur: Safety/medical concerns  Assist level: Moderate Assistance - Patient 50 - 74% Assistive device: Walker-rolling Max distance: 48ft   Walk 10 feet activity   Assist  Walk 10 feet activity did not occur: Safety/medical concerns  Assist level: Moderate Assistance - Patient - 50 - 74% Assistive device: Walker-rolling   Walk 50 feet activity   Assist Walk 50 feet with 2 turns activity did not occur: Safety/medical concerns         Walk 150 feet activity   Assist Walk  150 feet activity did not occur: Safety/medical concerns         Walk 10 feet on uneven surface  activity   Assist Walk 10 feet on uneven surfaces activity did not occur: Safety/medical concerns         Wheelchair     Assist Will patient use wheelchair at discharge?: Yes Type of Wheelchair: Manual Wheelchair activity did not occur:  Safety/medical concerns  Wheelchair assist level: Moderate Assistance - Patient 50 - 74% Max wheelchair distance: 4'    Wheelchair 50 feet with 2 turns activity    Assist    Wheelchair 50 feet with 2 turns activity did not occur: Safety/medical concerns       Wheelchair 150 feet activity     Assist  Wheelchair 150 feet activity did not occur: Safety/medical concerns       Blood pressure (!) 148/68, pulse 97, temperature 98 F (36.7 C), temperature source Oral, resp. rate 16, height 5\' 7"  (1.702 m), weight 104.8 kg, SpO2 99 %.  Medical Problem List and Plan: 1.Left hemiplegia with dysphagia/dysarthriasecondary to right watershed territory infarction in the setting of severe stenosis right M1 segment status post revascularization as well as small left lateral posterior temporal lobe infarct in right middle cerebellar peduncle infarct  Continue CIR 2. Antithrombotics: -DVT/anticoagulation:Subcutaneous heparin -antiplatelet therapy: Aspirin 81 mg daily, Brilinta 90 mg twice daily 3. Pain Management:Tylenol as needed  Added voltaren gel for R foot 4. Mood:Seroquel 12.5 mg nightly DC'd on 10/18   Fluoxetine started on 10/18 -antipsychotic agents: N/A 5. Neuropsych: She is not fully capable of making decisions onherown behalf. 6. Skin/Wound Care:Routine skin checks 7. Fluids/Electrolytes/Nutrition:Routine in and outs 8. Post strokedysphagia.   D2 nectars, advanced to D2 thins  Encourage fluids  Advance diet as tolerated  9. Hypertension.   Maxide 37.5-25 mg daily DC'd on 10/14 due to AKI  Continue clonidine patch 0.1 mg change every 7 days, Zebeta 2.5 mg daily.   Vitals:   02/28/19 1915 03/01/19 0316  BP: (!) 160/46 (!) 148/68  Pulse: 62 97  Resp: 16 16  Temp: 98 F (36.7 C) 98 F (36.7 C)  SpO2: 95% 99%   Labile on 10/20, likely secondary to IV fluids as well as discontinuation of Maxide.  10. Hyperlipidemia.  Lipitor 11. Hypothyroidism. Synthroid  TSH very elevated, Synthroid increased on 10/21 12. R foot pain- Mid foot OA:?  Resolved 13.  Leukocytosis: Resolved  WBC 7.1 on 10/17  Afebrile  Continue to monitor 14.  AKI with acute lower UTI  Creatinine 1.14 on 10/20  Echo reviewed-EF 65-70% with hyperdynamic left ventricle  IVF changed to nightly on 10/15  Ucx Enterococcus Faecalis-started on ampicillin on 10/16, discussed abx with pharmacy-unclear how patient was started on antibiotics and therefore likely invalidating repeat culture.  Discussed with pharmacy - information relayed.  We will continue to monitor efficacy of antibiotics now that she has been started on them. 15.  Hyperglycemia  Improving on 10/20 16.  Metabolic encephalopathy  Likely multifactorial-AKA +/- hypothyroidism +/- with pseudodementia  See # 4, 14  Slowly improving  LOS: 18 days A FACE TO FACE EVALUATION WAS PERFORMED  Jodeci Roarty [name] 03/01/2019, 10:13 AM

## 2019-03-02 ENCOUNTER — Inpatient Hospital Stay (HOSPITAL_COMMUNITY): Payer: Medicare Other

## 2019-03-02 ENCOUNTER — Inpatient Hospital Stay (HOSPITAL_COMMUNITY): Payer: Medicare Other | Admitting: Speech Pathology

## 2019-03-02 ENCOUNTER — Inpatient Hospital Stay (HOSPITAL_COMMUNITY): Payer: Medicare Other | Admitting: Occupational Therapy

## 2019-03-02 MED ORDER — QUETIAPINE FUMARATE 25 MG PO TABS
12.5000 mg | ORAL_TABLET | Freq: Every day | ORAL | Status: DC
  Administered 2019-03-02 – 2019-03-05 (×4): 12.5 mg via ORAL
  Filled 2019-03-02 (×4): qty 1

## 2019-03-02 NOTE — Progress Notes (Signed)
Patient refused all morning medication. Patient educated on need to take medication and she told me she would not take anything.

## 2019-03-02 NOTE — Progress Notes (Signed)
Speech Language Pathology Daily Session Note  Patient Details  Name: Christy Carter MRN: [id number] Date of Birth: 02-28-1934  Today's Date: 03/02/2019 SLP Individual Time: 4765-4650 SLP Individual Time Calculation (min): 54 min  Short Term Goals: Week 3: SLP Short Term Goal 1 (Week 3): Pt will consume dysphagia 2 diet with thin liquids and superivison level cues for use of compensatory swallow strategies. SLP Short Term Goal 2 (Week 3): Pt will consume trials of dysphagia 3 with Min A cues for use of compensatory swallow strategies. SLP Short Term Goal 3 (Week 3): Pt will locate items to the left of midline in >25% of opportunities during basic, familiar tasks with mod assist multimodal cues. SLP Short Term Goal 4 (Week 3): Pt will utilize external aids to recall basic, daily information with mod assist verbal cues. SLP Short Term Goal 5 (Week 3): Pt will complete basic, familiar tasks wtih mod assist multimodal cues for functional problem solving. SLP Short Term Goal 6 (Week 3): Pt will sustain attention to basic familiar task for 15 minutes with Mod A cues for redirection to task.  Skilled Therapeutic Interventions: Pt was seen for skilled ST targeting dysphagia and cognitive goals. Pt was with increased fatigue and confusion this morning, which impacts performance throughout tasks. Pt required Min A verbal cues for efficient use of swallow strategies during dys 2 and dys 3 solid consumption, however Mod A verbal cues required for sustained attention to intake and awareness of boluses. Pt is not appropriate for upgrade at this time - continue dys 2, thin liquid diet, full supervision. Pt required overall Max A to sequence numbers on an October calendar and sort colors (total of 6). Mod A verbal and visual cues provided for pt to use external aids in order to orient to place and date. Pt was left in wheelchair with seatbelt alarm engaged and all needs met. Continue per current plan of care.       Pain Pain Assessment Pain Scale: Faces Pain Score: 8  Faces Pain Scale: No hurt Pain Type: Acute pain Pain Location: Abdomen(side ) Pain Orientation: Right Pain Intervention(s): Medication (See eMAR)  Therapy/Group: Individual Therapy  [name] 03/02/2019, 9:29 AM

## 2019-03-02 NOTE — Progress Notes (Signed)
Occupational Therapy Session Note  Patient Details  Name: Christy Carter MRN: [id number] Date of Birth: 01/25/1934  Today's Date: 03/02/2019 OT Individual Time: 0930-1030 OT Individual Time Calculation (min): 60 min    Short Term Goals: Week 1:  OT Short Term Goal 1 (Week 1): Pt will don shirt with mod A OT Short Term Goal 1 - Progress (Week 1): Met OT Short Term Goal 2 (Week 1): Pt will maintain static EOB sitting balance with min A while completing ADL task OT Short Term Goal 2 - Progress (Week 1): Met OT Short Term Goal 3 (Week 1): Pt will complete LB bathing bed level with LRAD with mod A OT Short Term Goal 3 - Progress (Week 1): Met OT Short Term Goal 4 (Week 1): Pt will require only mod cueing for attending to L side of body during UB bathing OT Short Term Goal 4 - Progress (Week 1): Met Week 2:  OT Short Term Goal 1 (Week 2): Pt will be able to rise to stand to RW with min A of 1 to prepare for LB self care. OT Short Term Goal 1 - Progress (Week 2): Progressing toward goal(Pt can rise from elevated surface with min A (bed, toilet) but needs mod A from w/c) OT Short Term Goal 2 (Week 2): Pt will be able to stand with mod A for dynamic balance as she pulls pants over hips with min A. OT Short Term Goal 2 - Progress (Week 2): Met OT Short Term Goal 3 (Week 2): Min A squat or stand pivot to toilet. OT Short Term Goal 3 - Progress (Week 2): Progressing toward goal(Pt can stand pivot to toilet with bar with mod A.) OT Short Term Goal 4 (Week 2): Pt will don shirt with min A. OT Short Term Goal 4 - Progress (Week 2): Met OT Short Term Goal 5 (Week 2): Pt will don pants with min A. OT Short Term Goal 5 - Progress (Week 2): Met Week 3:  OT Short Term Goal 1 (Week 3): Pt will be able to sit to stand from w/c with min A to prepare for ADL transfers. OT Short Term Goal 2 (Week 3): Pt will be able to maintain dynamic stand with min A as she pulls pants over her hips. OT Short Term Goal 3  (Week 3): Pt will be able to toilet with min A. OT Short Term Goal 4 (Week 3): Pt will demonstrate improved L side awareness and praxis to stand pivot transfer with min A to her L with only min cues.     Skilled Therapeutic Interventions/Progress Updates:   "Oh I felt so confused last night and this morning! I think I have been in the hospital too long!" Discussed pt's confusion last night and she knows that this has been happening.  As she gets more active in the session, she is able to focus more.  Her RN stated that she has a UTI.  Pt's urine was very malodorous and she has an incontinent episode during self care which has not happened before.      ADL Retraining: grooming at sink with verbal cues to integrate use of L hand. Able to open/ close toothpaste using hands together but needs cues to actively use L fingers to twist top of vs. Compensatory strategies.   B/d at sink with a focus on sit to stand at sink. Pt initially needed a great deal of A as she was having difficulty with forward lean  And then was able to push up to stand with mod A 2nd time and then min the 3rd time.  Pt able to reach her bottom in standing. Min with bathing to support balance, mod A for LB dressing.  Continues to need cues to scan to her L to find items but has good L side body awareness.    Pt completed all of her self care and sat in w/c. Belt alarm on and all needs met.      Therapy Documentation Precautions:  Precautions Precautions: Fall, Other (comment) Precaution Comments: L inattention, left hemiparesis Restrictions Weight Bearing Restrictions: No  Pain: Pain Assessment Pain Scale: 0-10 Pain Score: 8  Pain Type: Acute pain Pain Location: Abdomen(side ) Pain Orientation: Right Pain Intervention(s): Medication (See eMAR) ADL: ADL Eating: Supervision/safety Grooming: Supervision/safety Where Assessed-Grooming: Sitting at sink Upper Body Bathing: Supervision/safety Where Assessed-Upper Body  Bathing: Shower Lower Body Bathing: Contact guard Where Assessed-Lower Body Bathing: Shower Upper Body Dressing: Minimal assistance Where Assessed-Upper Body Dressing: Wheelchair Lower Body Dressing: Moderate assistance Where Assessed-Lower Body Dressing: Wheelchair Toileting: Moderate assistance Where Assessed-Toileting: Glass blower/designer: Moderate assistance Toilet Transfer Method: Stand pivot Toilet Transfer Equipment: Grab bars, Raised toilet seat Social research officer, government: Moderate assistance Social research officer, government Method: Radiographer, therapeutic: Radio broadcast assistant, Grab bars   Therapy/Group: Individual Therapy  [place] 03/02/2019, 8:46 AM

## 2019-03-02 NOTE — Patient Care Conference (Signed)
Inpatient RehabilitationTeam Conference and Plan of Care Update Date: 03/02/2019   Time: 11:10 AM    Patient Name: Christy Carter      Medical Record Number: [id number]  Date of Birth: 02-Jul-1933 Sex: Female         Room/Bed: 4W19C/4W19C-01 Payor Info: Payor: [employer] MEDICARE / Plan: [facility] MEDICARE / Product Type: *No Product type* /    Admit Date/Time:  02/11/2019  2:15 PM  Primary Diagnosis:  Acute bilat watershed infarction [facility])  Patient Active Problem List   Diagnosis Date Noted  . Benign essential HTN   . Acute lower UTI   . Pseudodementia   . Labile blood pressure   . Metabolic encephalopathy   . Hyperglycemia   . Encephalopathy   . AKI (acute kidney injury) ([place])   . Leukocytosis   . Dysphagia, post-stroke   . Acute bilat watershed infarction [facility]) 02/11/2019  . Agitation requiring sedation protocol 02/09/2019  . Dysphagia 02/09/2019  . Stroke (cerebrum) ([place]) - R watershed d/t large vessel dz & small L temp lobe & R cerebellar infarcts 02/05/2019  . Middle cerebral artery stenosis, right 02/05/2019  . Acute respiratory failure ([place]) 02/03/2019  . Acute respiratory failure with hypoxemia ([place]) 02/03/2019  . Abnormal renal function 01/19/2019  . Pain in left foot 12/19/2018  . Hematuria 08/02/2018  . Influenza A 03/02/2018  . Urinary tract infection without hematuria 03/02/2018  . Sepsis ([place]) 02/11/2018  . CAP (community acquired pneumonia) 02/11/2018  . CKD (chronic kidney disease), stage III 02/11/2018  . Memory loss, short term 02/10/2018  . Flu vaccine need 02/10/2018  . Chest pain 02/03/2018  . Acute upper respiratory infection 02/03/2018  . Gastroesophageal reflux disease without esophagitis 02/03/2018  . Encounter for general adult medical examination with abnormal findings 01/13/2018  . Chronic low back pain 01/13/2018  . Need for vaccination against Streptococcus pneumoniae using pneumococcal conjugate vaccine 7 01/13/2018  . Dysuria 01/13/2018   . 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 Pelvic pain 10/08/2017  . Gout 06/15/2017  . Mixed hyperlipidemia 06/15/2017  . Inflammatory polyarthropathy ([place]) 06/15/2017  . Neuralgia and neuritis, unspecified 06/15/2017  . Major depressive disorder, recurrent, mild ([place]) 06/15/2017  . Other vitamin B12 deficiency anemias 06/15/2017  . Hypothyroidism 10/17/2008  . OVERWEIGHT/OBESITY 10/17/2008  . SLEEP APNEA, OBSTRUCTIVE 10/17/2008  . Essential hypertension 10/17/2008  . GALLSTONES 10/17/2008  . DYSPNEA 10/17/2008  . Obesity 10/17/2008    Expected Discharge Date: Expected Discharge Date: 03/09/19  Team Members Present: Physician leading conference: Dr. Delice Lesch Social Worker Present: Ovidio Kin, LCSW Nurse Present: Dorthula Nettles, RN PT Present: Michaelene Song, PT OT Present: Clyda Greener, OT SLP Present: Jettie Booze, CF-SLP PPS Coordinator present : Gunnar Fusi, SLP     Current Status/Progress Goal Weekly Team Focus  Bowel/Bladder   in/con of bowel and bladder patient is confused at times and refuses to go to the bathroom. This leads to incon.  Con of b/b with use of timed tolieting.  Timed toileting and prn medications as needed.   Swallow/Nutrition/ Hydration   Dys 2/thin, Min A swallow strategies (even during Dys 3 trials)  Supervision A  Continue Dys 3 trials (anticipate she will be ready to upgrade this week)   ADL's   min A UB dressing, mod LB dressing and toileting, S UB bathing, CGA lower body bathing, transfers min -mod A, fluctuating cognition  min stand balance, bathing, LB dressing, toileting and toilet transfers  ADL training, LE  strength and balance for standing balance, LUE strengthening, pt/family education   Mobility   min A bed mobility, transfers min/mod A with RW, ambulation 68ft with RW min/mod A, poor RW safety awareness, reports feeling depressed, continues to remain confused.  min assist  bed mobility, transfers, gait,  cognition, balance, endurance with activity, L attention, LE strengthening, NMR   Communication             Safety/Cognition/ Behavioral Observations  Min-Max A - inconsistent progress due to fluctuating levels of confusion  Min A (may need to downgrade to Mod, depending on progress this week)  orientation, basic problem solving, recall with aid, emergent awareness   Pain   No c/o pain at this time.  pain less than a 3.  qshift pain assesment and prn.   Skin   Patient has ecchymosis all over her body.  Patient will remain free from breakdown.  Qshift assesment having patient change positions q2hrs.    Rehab Goals Patient on target to meet rehab goals: Yes *See Care Plan and progress notes for long and short-term goals.     Barriers to Discharge  Current Status/Progress Possible Resolutions Date Resolved   Nursing                  PT                    OT                  SLP                SW                Discharge Planning/Teaching Needs:  Daughter to come in for education aware will need 24 hr care at DC. Will have her come in and have neruo-psych see pt for coping issues.      Team Discussion: BP med adjusted, IV fluids nightly, abx started, increased synthroid.  Inc at times, side pain.  OT min UB dressing, S UB bathing, mod A LB dressing and toileting.  PT confused, mod A transfers, start fam ed tomorrow.  SLP very confused, max A overall, tired.  Has min A goals and may need to downgrade goals.   Revisions to Treatment Plan:  N/A    Medical Summary Current Status: Left hemiplegia with dysphagia/dysarthria secondary to right watershed territory infarction in the setting of severe stenosis right M1 segment status post revascularization as well as small left lateral posterior temporal lobe infarct in right middle cerebellar peduncle infarct Weekly Focus/Goal: Improve dysphagia, BP, AKI, TSH, UTI, encephalopathy  Barriers to Discharge: Medical stability;Behavior    Possible Resolutions to Barriers: Therapies, advance diet as tolerated, cont IVF, follow labs, optimize BP meds, increase synthroid   Continued Need for Acute Rehabilitation Level of Care: The patient requires daily medical management by a physician with specialized training in physical medicine and rehabilitation for the following reasons: Direction of a multidisciplinary physical rehabilitation program to maximize functional independence : Yes Medical management of patient stability for increased activity during participation in an intensive rehabilitation regime.: Yes Analysis of laboratory values and/or radiology reports with any subsequent need for medication adjustment and/or medical intervention. : Yes   I attest that I was present, lead the team conference, and concur with the assessment and plan of the team.   Retta Diones 03/02/2019, 8:15 PM  Team conference was held via web/ teleconference due to [place] - 19

## 2019-03-02 NOTE — Progress Notes (Signed)
Physical Therapy Session Note  Patient Details  Name: Christy Carter MRN: [id number] Date of Birth: 24-Mar-1934  Today's Date: 03/02/2019 PT Individual Time: 1330-1446 PT Individual Time Calculation (min): 76 min   Short Term Goals: Week 2:  PT Short Term Goal 1 (Week 2): Pt wil perform supine<>sit with min assist PT Short Term Goal 1 - Progress (Week 2): Met PT Short Term Goal 2 (Week 2): Pt will perform sit<>stand using LRAD with min assist, from chair height PT Short Term Goal 2 - Progress (Week 2): Progressing toward goal PT Short Term Goal 3 (Week 2): Pt will perform bed<>chair transfer using LRAD with mod assist of 1 PT Short Term Goal 3 - Progress (Week 2): Met PT Short Term Goal 4 (Week 2): Pt will ambulate at least 64f with max assist using LRAD PT Short Term Goal 4 - Progress (Week 2): Met Week 3:  PT Short Term Goal 1 (Week 3): Pt will perform supine<>sit with CGA PT Short Term Goal 2 (Week 3): Pt will perform sit<>stand with min assist consistently PT Short Term Goal 3 (Week 3): Pt will perform bed<>chair transfers using LRAD with min assist PT Short Term Goal 4 (Week 3): Pt will ambulate at least 349fusing LRAD with min assist  Skilled Therapeutic Interventions/Progress Updates:   Received pt sitting in WC, pt agreeable to PT and denied any pain. Pt in tears upon PT arrival stating "I can't remember my daughter's number, I want to call her to take me to therapy". Pt reoriented and therapist provided emotional support. Pt more confused today than normal. Pt transferred WC<>mat stand pivot with RW mod A and verbal cues for step sequence, foot placement, and RW management. Pt performed 3x5 sit<>stands from mat min A with bilateral UE support on RW. Pt performed car transfer with RW mod A+2 with verbal cues for step sequence, foot placement, turning, and RW safety. Pt unable to problem solve how to get LE's into car. Pt transferred stand pivot x2 trials with RW mod A and same verbal  cues. Pt with continued difficulty turning with RW. While sitting on mat pt performed card matching. Pt required verbal cues for identifying number on card and identifying match. Pt unable to identify numbers correctly. Throughout session pt continued to state "I just can't do this anymore". Pt encouraged to participate in therapy and reassured that she is making progress. Concluded session with pt sitting upright in WC, pillow under LUE for support, needs within reach, and seatbelt alarm on.   Therapy Documentation Precautions:  Precautions Precautions: Fall, Other (comment) Precaution Comments: L inattention, left hemiparesis Restrictions Weight Bearing Restrictions: No  Therapy/Group: Individual Therapy  AnBarreraT, DPT  03/02/2019, 1:29 PM

## 2019-03-02 NOTE — Progress Notes (Signed)
[place] PHYSICAL MEDICINE & REHABILITATION PROGRESS NOTE  Subjective/Complaints: Patient seen sitting up in bed this morning.  She states she slept well overnight, however per nursing, patient did not sleep well overnight.  ROS: Denies SOB, CP, N/V/D.   Objective:   No results found. No results for input(s): WBC, HGB, HCT, PLT in the last 72 hours. Recent Labs    02/28/19 1521 03/01/19 0502  NA 140 142  K 4.2 3.8  CL 107 109  CO2 23 23  GLUCOSE 133* 105*  BUN 13 9  CREATININE 1.18* 1.14*  CALCIUM 9.3 9.4    Intake/Output Summary (Last 24 hours) at 03/02/2019 1229 Last data filed at 03/02/2019 0850 Gross per 24 hour  Intake 2215.75 ml  Output -  Net 2215.75 ml     Physical Exam: Vital Signs Blood pressure (!) 152/58, pulse 68, temperature 98 F (36.7 C), temperature source Oral, resp. rate 16, height 5\' 7"  (1.702 m), weight 104.8 kg, SpO2 97 %. Constitutional: No distress . Vital signs reviewed.  Morbidly obese. HENT: Normocephalic.  Atraumatic. Eyes: EOMI. No discharge. Cardiovascular: No JVD. Respiratory: Normal effort.  No stridor. GI: Non-distended. Skin: Warm and dry.  Intact. Psych: Normal mood.  Normal behavior. Musc: Lower extremity edema Neurological:Alert and oriented x2 Motor: LUE/LLE: 4+/5 proximal to distal, unchanged RUE/RLE: 5/5 proximal distal, stable  Assessment/Plan: 1. Functional deficits secondary to Left hemiparesis with dysphagia/dysarthriasecondary to right watershed territory infarction in the setting of severe stenosis right M1 segment status post revascularization which require 3+ hours per day of interdisciplinary therapy in a comprehensive inpatient rehab setting.  Physiatrist is providing close team supervision and 24 hour management of active medical problems listed below.  Physiatrist and rehab team continue to assess barriers to discharge/monitor patient progress toward functional and medical goals  Care Tool:  Bathing  Bathing activity did not occur: Safety/medical concerns Body parts bathed by patient: Right arm, Left arm, Chest, Abdomen, Front perineal area, Buttocks, Right upper leg, Left upper leg, Right lower leg, Left lower leg, Face   Body parts bathed by helper: Right lower leg, Left lower leg     Bathing assist Assist Level: Minimal Assistance - Patient > 75%     Upper Body Dressing/Undressing Upper body dressing   What is the patient wearing?: Bra, Pull over shirt    Upper body assist Assist Level: Minimal Assistance - Patient > 75%    Lower Body Dressing/Undressing Lower body dressing      What is the patient wearing?: Pants, Incontinence brief     Lower body assist Assist for lower body dressing: Moderate Assistance - Patient 50 - 74%     Toileting Toileting    Toileting assist Assist for toileting: Maximal Assistance - Patient 25 - 49%     Transfers Chair/bed transfer  Transfers assist     Chair/bed transfer assist level: Moderate Assistance - Patient 50 - 74%     Locomotion Ambulation   Ambulation assist   Ambulation activity did not occur: Safety/medical concerns  Assist level: 2 helpers Assistive device: Walker-rolling Max distance: 30ft   Walk 10 feet activity   Assist  Walk 10 feet activity did not occur: Safety/medical concerns  Assist level: 2 helpers Assistive device: Walker-rolling   Walk 50 feet activity   Assist Walk 50 feet with 2 turns activity did not occur: Safety/medical concerns         Walk 150 feet activity   Assist Walk 150 feet activity did not occur: Safety/medical  concerns         Walk 10 feet on uneven surface  activity   Assist Walk 10 feet on uneven surfaces activity did not occur: Safety/medical concerns         Wheelchair     Assist Will patient use wheelchair at discharge?: Yes Type of Wheelchair: Manual Wheelchair activity did not occur: Safety/medical concerns  Wheelchair assist level:  Moderate Assistance - Patient 50 - 74% Max wheelchair distance: 78'    Wheelchair 50 feet with 2 turns activity    Assist    Wheelchair 50 feet with 2 turns activity did not occur: Safety/medical concerns       Wheelchair 150 feet activity     Assist  Wheelchair 150 feet activity did not occur: Safety/medical concerns       Blood pressure (!) 152/58, pulse 68, temperature 98 F (36.7 C), temperature source Oral, resp. rate 16, height 5\' 7"  (1.702 m), weight 104.8 kg, SpO2 97 %.  Medical Problem List and Plan: 1.Left hemiplegia with dysphagia/dysarthriasecondary to right watershed territory infarction in the setting of severe stenosis right M1 segment status post revascularization as well as small left lateral posterior temporal lobe infarct in right middle cerebellar peduncle infarct  Continue CIR  Team conference today to discuss current and goals and coordination of care, home and environmental barriers, and discharge planning with nursing, case manager, and therapies.  2. Antithrombotics: -DVT/anticoagulation:Subcutaneous heparin -antiplatelet therapy: Aspirin 81 mg daily, Brilinta 90 mg twice daily 3. Pain Management:Tylenol as needed  Added voltaren gel for R foot 4. Mood:Seroquel 12.5 mg nightly   Fluoxetine started on 10/18 -antipsychotic agents: N/A 5. Neuropsych: She is not fully capable of making decisions onherown behalf. 6. Skin/Wound Care:Routine skin checks 7. Fluids/Electrolytes/Nutrition:Routine in and outs 8. Post strokedysphagia.   D2 nectars, advanced to D2 thins  Encourage fluids  Advance diet as tolerated  9. Hypertension.   Maxide 37.5-25 mg daily DC'd on 10/14 due to AKI  Continue clonidine patch 0.1 mg change every 7 days, Zebeta 2.5 mg daily.   Vitals:   03/01/19 1907 03/02/19 0403  BP: (!) 111/55 (!) 152/58  Pulse: 60 68  Resp: 16   Temp: 98 F (36.7 C)   SpO2: 96% 97%   Labile on  10/21, likely secondary to IV fluids as well as discontinuation of Maxide.  10. Hyperlipidemia. Lipitor 11. Hypothyroidism. Synthroid  TSH very elevated, Synthroid increased on 10/21 12. R foot pain- Mid foot OA:?  Resolved 13.  Leukocytosis: Resolved  WBC 7.1 on 10/17  Afebrile  Continue to monitor 14.  AKI with acute lower UTI  Creatinine 1.14 on 10/20, will plan to order labs end of the week  Echo reviewed-EF 65-70% with hyperdynamic left ventricle  IVF changed to nightly on 10/15  Ucx Enterococcus Faecalis-started on ampicillin on 10/16, discussed abx with pharmacy-unclear how patient was started on antibiotics and therefore likely invalidating repeat culture.  Discussed with pharmacy - information relayed.  We will continue to monitor efficacy of antibiotics now that she has been started on them. 15.  Hyperglycemia  Improving on 10/20 16.  Metabolic encephalopathy  Likely multifactorial-AKA +/- hypothyroidism +/- with pseudodementia  See # 4, 14  Stable 17. Sleep disturbance  Low dose Seroquel started on 10/21  LOS: 19 days A FACE TO FACE EVALUATION WAS PERFORMED  Christy Carter 03/02/2019, 12:29 PM

## 2019-03-03 ENCOUNTER — Inpatient Hospital Stay (HOSPITAL_COMMUNITY): Payer: Medicare Other | Admitting: Physical Therapy

## 2019-03-03 ENCOUNTER — Inpatient Hospital Stay (HOSPITAL_COMMUNITY): Payer: Medicare Other | Admitting: Speech Pathology

## 2019-03-03 ENCOUNTER — Inpatient Hospital Stay (HOSPITAL_COMMUNITY): Payer: Medicare Other | Admitting: Occupational Therapy

## 2019-03-03 NOTE — Progress Notes (Signed)
Physical Therapy Session Note  Patient Details  Name: Christy Carter MRN: [id number] Date of Birth: 08-27-1933  Today's Date: 03/03/2019 PT Individual Time: [id number] PT Individual Time Calculation (min): 56 min   Short Term Goals: Week 3:  PT Short Term Goal 1 (Week 3): Pt will perform supine<>sit with CGA PT Short Term Goal 2 (Week 3): Pt will perform sit<>stand with min assist consistently PT Short Term Goal 3 (Week 3): Pt will perform bed<>chair transfers using LRAD with min assist PT Short Term Goal 4 (Week 3): Pt will ambulate at least 3ft using LRAD with min assist  Skilled Therapeutic Interventions/Progress Updates:    Pt received sitting in w/c stating "I'm not worth nothing today" reporting she has had 3 episodes of diarrhea today (confirmed in pt's chart this was accurate) - then pt states "the doctor better get on the ball or I'm going to be [facility] the [facility]." Patient appears to be more oriented and aware of her situation today. Pt agreeable to therapy session but stating she didn't know how much she could do because she "felt so weak."  Transported to/from gym in w/c. Sit>stands w/c>RW with heavy min assist for initiating lifting hips from chair. Initiated stand pivot w/c>EOM using RW with mod assist for lifting into standing and mod assist for balance and AD management while turning to sit with max cuing for sequencing - pt suddenly passed gas sounding like she may need to have another loose stool - deferred transfer to mat and returned to room for toileting. Stand pivot w/c<>toilet using B UE support on grab bars with min assist for balance and max multimodal cuing for sequencing - +2 total assist for LB clothing management and peri-care - pt did not have BM despite increased time for toileting. Ambulated ~63ft x2 (seated break between) using RW with min assist for balance and AD management as well as +2 assist for w/c follow - pt demonstrates improving symmetrical and reciprocal  step lengths but with narrow BOS and increased L LE hip external rotation - pt demonstrated improved gait when therapist assisted with forward RW propulsion. Pt requesting to return to bed. Stand pivot w/c>EOB using RW with mod assist for lifting into standing and min assist for balance while turning to sit - max cuing for sequencing. Sit>supine with mod assist for LE management and trunk descent. Pt left supine in bed with needs in reach and bed alarm on.  Therapy Documentation Precautions:  Precautions Precautions: Fall, Other (comment) Precaution Comments: L inattention, left hemiparesis Restrictions Weight Bearing Restrictions: No  Pain: Reports she is having general stomach pain from all of the diarrhea.    Therapy/Group: Individual Therapy  Tawana Scale, PT, DPT 03/03/2019, 1:28 PM

## 2019-03-03 NOTE — Progress Notes (Signed)
Social Work Patient ID: Christy Carter, female   DOB: January 04, 1934, 85 y.o.   MRN: [id number]  Spoke with daughter via telephone to discuss team conference progression toward her goals and the plan still for discharge 10/28. Daughter plans on spending the night and being here tomorrow to start family training. She hopes it will make her Mom feel better with her here and less confused. She reports before this stroke she was not confused. Daughter is aware of pt's need for 24 hr care at discharge.

## 2019-03-03 NOTE — Progress Notes (Signed)
Occupational Therapy Session Note  Patient Details  Name: Christy Carter MRN: [id number] Date of Birth: [birth year]  Today's Date: 03/03/2019 OT Individual Time: 5009-3818 OT Individual Time Calculation (min): 70 min    Short Term Goals: Week 3:  OT Short Term Goal 1 (Week 3): Pt will be able to sit to stand from w/c with min A to prepare for ADL transfers. OT Short Term Goal 2 (Week 3): Pt will be able to maintain dynamic stand with min A as she pulls pants over her hips. OT Short Term Goal 3 (Week 3): Pt will be able to toilet with min A. OT Short Term Goal 4 (Week 3): Pt will demonstrate improved L side awareness and praxis to stand pivot transfer with min A to her L with only min cues.  Skilled Therapeutic Interventions/Progress Updates:    Pt received in w/c and stated "I just cant keep my eyes open! I am so tired".  She was agreeable to trying therapy and engaging in self care. Pt set up at the sink and needed mod cueing to find items on her L side, and use her L hand actively.  Pt needed to keep her hospital gown on as she was connected to IV.  Pt stated she had bathed 2x earlier by nursing bc she had 2 accidents.  Pt kept closing her eyes.  To help her wake up, played music she likes to listen to and had pt do aerobic movements with her arms.  Pt engaged well.  Pt placed her L foot into pants and then needed mod A with R foot.  Pt worked on sit to stand to prepare to don pants over hips, pt was then having great difficulty with following directions and needed max +2 A to stand up and then max to maintain balance.  Her L foot kept sliding inward. Placed dycem on the floor to help with foot positioning.   Pt tried again with max A and then needed to toilet right away.  Transferred to bathroom with +2 A to stand pivot with bars and with mod verbal cues.  She was somewhat incontinent in her brief of bowel.  Pt was cleansed and dressed with total A.     She was having great difficulty today  following directions and needed much more A with her mobility.  Pt resting in wc with belt alarm on and all needs met.    Therapy Documentation Precautions:  Precautions Precautions: Fall, Other (comment) Precaution Comments: L inattention, left hemiparesis Restrictions Weight Bearing Restrictions: No   Pain: Pain Assessment Pain Score: 0-No pain   Therapy/Group: Individual Therapy  [place] 03/03/2019, 9:47 AM

## 2019-03-03 NOTE — Progress Notes (Signed)
[place] PHYSICAL MEDICINE & REHABILITATION PROGRESS NOTE  Subjective/Complaints: Patient seen sitting up in bed this morning.  She states she slept well overnight.  No reported issues per nursing.  ROS: Denies SOB, CP, N/V/D.   Objective:   No results found. No results for input(s): WBC, HGB, HCT, PLT in the last 72 hours. Recent Labs    02/28/19 1521 03/01/19 0502  NA 140 142  K 4.2 3.8  CL 107 109  CO2 23 23  GLUCOSE 133* 105*  BUN 13 9  CREATININE 1.18* 1.14*  CALCIUM 9.3 9.4    Intake/Output Summary (Last 24 hours) at 03/03/2019 1007 Last data filed at 03/03/2019 0843 Gross per 24 hour  Intake 456.88 ml  Output 1 ml  Net 455.88 ml     Physical Exam: Vital Signs Blood pressure (!) 151/47, pulse (!) 59, temperature 97.6 F (36.4 C), temperature source Oral, resp. rate 18, height 5\' 7"  (1.702 m), weight 104.8 kg, SpO2 98 %. Constitutional: No distress . Vital signs reviewed.  Morbidly obese. HENT: Normocephalic.  Atraumatic. Eyes: EOMI. No discharge. Cardiovascular: No JVD. Respiratory: Normal effort.  No stridor. GI: Non-distended. Skin: Warm and dry.  Intact. Psych: Normal mood.  Normal behavior. Musc: Lower extremity edema, stable Neurological:Alert and oriented, except for date Motor: LUE/LLE: 4+/5 proximal to distal, stable RUE/RLE: 5/5 proximal distal, stable  Assessment/Plan: 1. Functional deficits secondary to Left hemiparesis with dysphagia/dysarthriasecondary to right watershed territory infarction in the setting of severe stenosis right M1 segment status post revascularization which require 3+ hours per day of interdisciplinary therapy in a comprehensive inpatient rehab setting.  Physiatrist is providing close team supervision and 24 hour management of active medical problems listed below.  Physiatrist and rehab team continue to assess barriers to discharge/monitor patient progress toward functional and medical goals  Care Tool:  Bathing  Bathing activity did not occur: Safety/medical concerns Body parts bathed by patient: Right arm, Left arm, Chest, Abdomen, Front perineal area, Buttocks, Right upper leg, Left upper leg, Right lower leg, Left lower leg, Face   Body parts bathed by helper: Right lower leg, Left lower leg     Bathing assist Assist Level: Minimal Assistance - Patient > 75%     Upper Body Dressing/Undressing Upper body dressing   What is the patient wearing?: Hospital gown only    Upper body assist Assist Level: Moderate Assistance - Patient 50 - 74%    Lower Body Dressing/Undressing Lower body dressing      What is the patient wearing?: Pants, Incontinence brief     Lower body assist Assist for lower body dressing: Maximal Assistance - Patient 25 - 49%     Toileting Toileting    Toileting assist Assist for toileting: Total Assistance - Patient < 25%     Transfers Chair/bed transfer  Transfers assist     Chair/bed transfer assist level: Moderate Assistance - Patient 50 - 74%     Locomotion Ambulation   Ambulation assist   Ambulation activity did not occur: Safety/medical concerns  Assist level: 2 helpers Assistive device: Walker-rolling Max distance: 42ft   Walk 10 feet activity   Assist  Walk 10 feet activity did not occur: Safety/medical concerns  Assist level: 2 helpers Assistive device: Walker-rolling   Walk 50 feet activity   Assist Walk 50 feet with 2 turns activity did not occur: Safety/medical concerns         Walk 150 feet activity   Assist Walk 150 feet activity did not occur:  Safety/medical concerns         Walk 10 feet on uneven surface  activity   Assist Walk 10 feet on uneven surfaces activity did not occur: Safety/medical concerns         Wheelchair     Assist Will patient use wheelchair at discharge?: Yes Type of Wheelchair: Manual Wheelchair activity did not occur: Safety/medical concerns  Wheelchair assist level: Moderate  Assistance - Patient 50 - 74% Max wheelchair distance: 24'    Wheelchair 50 feet with 2 turns activity    Assist    Wheelchair 50 feet with 2 turns activity did not occur: Safety/medical concerns       Wheelchair 150 feet activity     Assist  Wheelchair 150 feet activity did not occur: Safety/medical concerns       Blood pressure (!) 151/47, pulse (!) 59, temperature 97.6 F (36.4 C), temperature source Oral, resp. rate 18, height 5\' 7"  (1.702 m), weight 104.8 kg, SpO2 98 %.  Medical Problem List and Plan: 1.Left hemiplegia with dysphagia/dysarthriasecondary to right watershed territory infarction in the setting of severe stenosis right M1 segment status post revascularization as well as small left lateral posterior temporal lobe infarct in right middle cerebellar peduncle infarct  Continue CIR 2. Antithrombotics: -DVT/anticoagulation:Subcutaneous heparin -antiplatelet therapy: Aspirin 81 mg daily, Brilinta 90 mg twice daily 3. Pain Management:Tylenol as needed  Added voltaren gel for R foot 4. Mood:Seroquel 12.5 mg nightly   Fluoxetine started on 10/18 -antipsychotic agents: N/A 5. Neuropsych: She is not fully capable of making decisions onherown behalf. 6. Skin/Wound Care:Routine skin checks 7. Fluids/Electrolytes/Nutrition:Routine in and outs 8. Post strokedysphagia.   D2 nectars, advanced to D2 thins  Encourage fluids  Advance diet as tolerated  9. Hypertension.   Maxide 37.5-25 mg daily DC'd on 10/14 due to AKI  Continue clonidine patch 0.1 mg change every 7 days, Zebeta 2.5 mg daily.   Vitals:   03/02/19 2112 03/03/19 0400  BP: (!) 141/41 (!) 151/47  Pulse: (!) 55 (!) 59  Resp: 18   Temp: 98 F (36.7 C) 97.6 F (36.4 C)  SpO2: 95% 98%   Elevated on 10/22, likely secondary to IV fluids as well as discontinuation of Maxide.  10. Hyperlipidemia. Lipitor 11. Hypothyroidism. Synthroid  TSH very  elevated, Synthroid increased on 10/21 12. R foot pain- Mid foot OA:?  Resolved 13.  Leukocytosis: Resolved  WBC 7.1 on 10/17  Afebrile  Continue to monitor 14.  AKI with acute lower UTI  Creatinine 1.14 on 10/20, labs ordered for tomorrow  Echo reviewed-EF 65-70% with hyperdynamic left ventricle  IVF changed to nightly on 10/15  Ucx Enterococcus Faecalis-started on ampicillin on 10/16 15.  Hyperglycemia  Improving on 10/20 16.  Metabolic encephalopathy  Likely multifactorial-AKA +/- hypothyroidism +/- with pseudodementia  See # 4, 14  ?  Improving 17. Sleep disturbance  Low dose Seroquel started on 10/21, with benefit  LOS: 20 days A FACE TO FACE EVALUATION WAS PERFORMED  Gissell Barra [name] 03/03/2019, 10:07 AM

## 2019-03-03 NOTE — Progress Notes (Signed)
Speech Language Pathology Daily Session Note  Patient Details  Name: Christy Carter MRN: [id number] Date of Birth: Apr 19, 1934  Today's Date: 03/03/2019 SLP Individual Time: [id number] SLP Individual Time Calculation (min): 47 min  Short Term Goals: Week 3: SLP Short Term Goal 1 (Week 3): Pt will consume dysphagia 2 diet with thin liquids and superivison level cues for use of compensatory swallow strategies. SLP Short Term Goal 2 (Week 3): Pt will consume trials of dysphagia 3 with Min A cues for use of compensatory swallow strategies. SLP Short Term Goal 3 (Week 3): Pt will locate items to the left of midline in >25% of opportunities during basic, familiar tasks with mod assist multimodal cues. SLP Short Term Goal 4 (Week 3): Pt will utilize external aids to recall basic, daily information with mod assist verbal cues. SLP Short Term Goal 5 (Week 3): Pt will complete basic, familiar tasks wtih mod assist multimodal cues for functional problem solving. SLP Short Term Goal 6 (Week 3): Pt will sustain attention to basic familiar task for 15 minutes with Mod A cues for redirection to task.  Skilled Therapeutic Interventions: Pt was seen for skilled ST targeting cognitive goals. Upon entrance to room pt reported increased fatigue, which bothers her, but was agreeable to participate in therapy. She was oriented to self, place, and month with Min A for use of external aids. Max A required to identify today's date, however she is consistent in her ability to recall d/c date of 10/28. She selectively attended to tasks for 20 minute intervals with Min A verbal cues for redirection today. SLP facilitated session with Min A verbal and visual cues for error awareness to sort cards by color (black or red). Cueing for error awareness increased to Mod A when sorting cards by shape (club, spade, diamond, heart). During a basic novel card game (WAR), pt identified the higher value card (from field of 2) with Mod A  verbal cues for basic problem solving, as well as error awareness. Of note, pt's fatigue increased throughout session and was eventually unable to maintain arousal despite her best efforts. As a result, she missed 13 minutes of skilled ST today. Pt declined assistance to return to bed, as she reported she was comfortable in wheelchair. Pt was left in chair with seat alarm set and all needs within reach. Continue per current plan of care.      Pain Pain Assessment Pain Score: 0-No pain  Therapy/Group: Individual Therapy  [name] 03/03/2019, 11:58 AM

## 2019-03-04 ENCOUNTER — Inpatient Hospital Stay (HOSPITAL_COMMUNITY): Payer: Medicare Other | Admitting: Occupational Therapy

## 2019-03-04 ENCOUNTER — Ambulatory Visit (HOSPITAL_COMMUNITY): Payer: Medicare Other | Admitting: Physical Therapy

## 2019-03-04 ENCOUNTER — Inpatient Hospital Stay (HOSPITAL_COMMUNITY): Payer: Medicare Other | Admitting: Speech Pathology

## 2019-03-04 ENCOUNTER — Inpatient Hospital Stay (HOSPITAL_COMMUNITY): Payer: Medicare Other

## 2019-03-04 DIAGNOSIS — G479 Sleep disorder, unspecified: Secondary | ICD-10-CM

## 2019-03-04 LAB — BASIC METABOLIC PANEL
Anion gap: 9 (ref 5–15)
BUN: 13 mg/dL (ref 8–23)
CO2: 22 mmol/L (ref 22–32)
Calcium: 8.7 mg/dL — ABNORMAL LOW (ref 8.9–10.3)
Chloride: 110 mmol/L (ref 98–111)
Creatinine, Ser: 1 mg/dL (ref 0.44–1.00)
GFR calc Af Amer: >60 mL/min (ref >60)
GFR calc non Af Amer: 52 mL/min — ABNORMAL LOW (ref >60)
Glucose, Bld: 81 mg/dL (ref 70–99)
Potassium: 3.8 mmol/L (ref 3.5–5.1)
Sodium: 141 mmol/L (ref 135–145)

## 2019-03-04 MED ORDER — AMPICILLIN 500 MG PO CAPS
500.0000 mg | ORAL_CAPSULE | Freq: Three times a day (TID) | ORAL | Status: AC
  Administered 2019-03-04: 500 mg via ORAL
  Filled 2019-03-04: qty 1

## 2019-03-04 NOTE — Plan of Care (Signed)
  Problem: RH Problem Solving Goal: LTG Patient will demonstrate problem solving for (SLP) Description: LTG:  Patient will demonstrate problem solving for basic/complex daily situations with cues  (SLP) Flowsheets (Taken 03/04/2019 1231) LTG Patient will demonstrate problem solving for: Moderate Assistance - Patient 50 - 74%   Problem: RH Memory Goal: LTG Patient will use memory compensatory aids to (SLP) Description: LTG:  Patient will use memory compensatory aids to recall biographical/new, daily complex information with cues (SLP) Flowsheets (Taken 03/04/2019 1231) LTG: Patient will use memory compensatory aids to (SLP): Moderate Assistance - Patient 50 - 74%   Problem: RH Awareness Goal: LTG: Patient will demonstrate awareness during functional activites type of (SLP) Description: LTG: Patient will demonstrate awareness during functional activites type of (SLP) Flowsheets (Taken 03/04/2019 1231) LTG: Patient will demonstrate awareness during cognitive/linguistic activities with assistance of (SLP): Moderate Assistance - Patient 50 - 74%   Goals listed above have been downgraded due to pt's inconsistent progress (due to fluctuating levels of confusion and lethargy)

## 2019-03-04 NOTE — Progress Notes (Signed)
Physical Therapy Weekly Progress Note  Patient Details  Name: Christy Carter MRN: [id number] Date of Birth: Sep 26, 1933  Beginning of progress report period: February 25, 2019 End of progress report period: March 04, 2019  Today's Date: 03/04/2019 PT Individual Time: 1120-1213 PT Individual Time Calculation (min): 53 min   Patient has met 1 of 4 short term goals. Patient progressing slowly with therapy continuing to require intermittent mod assist for sit<>stand and stand pivot transfers depending on pt's fatigue level and the direction of the transfers (as pt requires increased assist transferring to the L due to inattention). Patient is progressing with ambulation of gait up to ~46f using RW with min/mod assist. Patient continues to demonstrate decreased functional strength in B LEs with increased difficult standing up from a chair or controlling descent as well as impaired L attention and motor planning requiring increased cuing and assist to perform mobility task safely.    Patient continues to demonstrate the following deficits muscle weakness and muscle joint tightness, decreased cardiorespiratoy endurance, impaired timing and sequencing, unbalanced muscle activation and decreased motor planning, decreased attention to left, decreased attention, decreased awareness, decreased problem solving, decreased safety awareness and delayed processing and decreased sitting balance, decreased standing balance, decreased postural control and decreased balance strategies and therefore will continue to benefit from skilled PT intervention to increase functional independence with mobility, decrease fall risk, and decrease caregiver burden.  Patient progressing toward long term goals..  Continue plan of care.  PT Short Term Goals Week 3:  PT Short Term Goal 1 (Week 3): Pt will perform supine<>sit with CGA PT Short Term Goal 1 - Progress (Week 3): Progressing toward goal PT Short Term Goal 2 (Week 3): Pt  will perform sit<>stand with min assist consistently PT Short Term Goal 2 - Progress (Week 3): Progressing toward goal PT Short Term Goal 3 (Week 3): Pt will perform bed<>chair transfers using LRAD with min assist PT Short Term Goal 3 - Progress (Week 3): Progressing toward goal PT Short Term Goal 4 (Week 3): Pt will ambulate at least 37fusing LRAD with min assist PT Short Term Goal 4 - Progress (Week 3): Met Week 4:  PT Short Term Goal 1 (Week 4): = to LTGs based on ELOS  Skilled Therapeutic Interventions/Progress Updates:  Ambulation/gait training;Community reintegration;DME/adaptive equipment instruction;Neuromuscular re-education;Psychosocial support;Stair training;UE/LE Strength taining/ROM;Wheelchair propulsion/positioning;Balance/vestibular training;Discharge planning;Functional electrical stimulation;Pain management;Skin care/wound management;Therapeutic Activities;UE/LE Coordination activities;Cognitive remediation/compensation;Disease management/prevention;Functional mobility training;Patient/family education;Splinting/orthotics;Therapeutic Exercise;Visual/perceptual remediation/compensation   Pt received sitting on toilet with RN present assisting with toileting and pt's daughter present in room for family education. Therapist assumed care of patient. Sit>stand toilet>grab bars with mod assist for lifting into standing. Mod assist for standing balance with B UE support on grab bar and +2 total assist for LB clothing management. Stand pivot toilet>w/c with mod assist for balance and manual facilitation for lateral weight shifting to improve B LE stepping. Therapist educated pt's daughter regarding pt's CLOF and therapist's recommendation that they do not ambulate at home without a therapist; however, daughter excited to see patient ambulate with therapist. Sit<>stands w/c<>RW with mod assist for lifting into standing and mod assist for lowering as pt has increased difficulty initiating coming  to standing from this low surface. Ambulated ~5061fsing RW with min/mod assist for balance and AD management with +2 assist for w/c follow - pt continues to demonstrate poor AD management too far forward, decreased B LE step length with L LE too far adducted, and significantly increased anterior trunk  flexion with downward gaze. Transported to/from gym in w/c. Simulated stand pivot car transfer (SUV height) using RW with therapist providing mod assist for balance while turning and assist for B LEs in the car all while educating pt's daughter on proper positioning to provide assist and sequencing of task. Pt then performed same transfer with daughter providing assist and therapist providing intermittent +2 assist for lifting/pivoting pt's hips as needed for increased pt/family safety - mod cuing for sequencing of transfer for pt/family. Pt's daughter becomes emotional when assisting pt with car transfers stating she wants to ensure patient is safe at home - therapist educated her on participating in more hands-on family education training sessions to increase her confidence to assist patient at discharge and pt's daughter agreeable. Performed stand pivot w/c<>EOM using RW with pt's daughter providing min/mod assist and therapist providing mod cuing for proper set-up and sequencing of transfer. Transported back to room in w/c and pt left sitting in w/c with needs in reach and seat belt alarm on.  Therapy Documentation Precautions:  Precautions Precautions: Fall, Other (comment) Precaution Comments: L inattention, left hemiparesis Restrictions Weight Bearing Restrictions: No  Pain:   Denies pain during session.  Therapy/Group: Individual Therapy  Tawana Scale, PT, DPT 03/04/2019, 8:00 AM

## 2019-03-04 NOTE — Progress Notes (Signed)
Physical Therapy Session Note  Patient Details  Name: Christy Carter MRN: [id number] Date of Birth: 1933-10-04  Today's Date: 03/04/2019 PT Individual Time: 1400-1430 PT Individual Time Calculation (min): 30 min   Short Term Goals: Week 2:  PT Short Term Goal 1 (Week 2): Pt wil perform supine<>sit with min assist PT Short Term Goal 1 - Progress (Week 2): Met PT Short Term Goal 2 (Week 2): Pt will perform sit<>stand using LRAD with min assist, from chair height PT Short Term Goal 2 - Progress (Week 2): Progressing toward goal PT Short Term Goal 3 (Week 2): Pt will perform bed<>chair transfer using LRAD with mod assist of 1 PT Short Term Goal 3 - Progress (Week 2): Met PT Short Term Goal 4 (Week 2): Pt will ambulate at least 73f with max assist using LRAD PT Short Term Goal 4 - Progress (Week 2): Met Week 3:  PT Short Term Goal 1 (Week 3): Pt will perform supine<>sit with CGA PT Short Term Goal 2 (Week 3): Pt will perform sit<>stand with min assist consistently PT Short Term Goal 3 (Week 3): Pt will perform bed<>chair transfers using LRAD with min assist PT Short Term Goal 4 (Week 3): Pt will ambulate at least 351fusing LRAD with min assist  Skilled Therapeutic Interventions/Progress Updates:   Received pt sitting upright in WC, pt agreeable to PT and denied any pain. Pt unable to remember daughter's phone number despite being written on her whiteboard. Therapist assisted and wrote down number on napkin for pt. Session focused on functional mobility/transfers, LE strength, balance, L attention, and improved activity tolerance. Pt transferred WC<> mat stand pivot with RW mod A x2 trials throughout session with verbal cues for RW safety, hand placement, step sequence, and foot placement when turning. Pt performed sit<>stands x6 from elevated mat min A using RW. While standing using mirror for visual feedback, pt worked on midline orientation and LLE knee extension in addition to standing  tolerance. While standing with RW pt threw horseshoes x 5 trials focusing on L attention grabbing horseshoes on L side of RW, using LUE, and throwing to target on L. In standing with bilateral UE support on RW pt performed 1x10 bilateral marches with verbal cues for weight shifting and picking up LEs CGA/min A. In standing with bilateral support on RW pt performed alternating toe taps to target of blue noodle 1x10 CGA/min A with verbal cues for foot placement, step sequence, and postural alignment. Concluded session with pt sitting in WC, needs within reach, and seatbelt alarm on.   Therapy Documentation Precautions:  Precautions Precautions: Fall, Other (comment) Precaution Comments: L inattention, left hemiparesis Restrictions Weight Bearing Restrictions: No   Therapy/Group: Individual Therapy  AnAlfonse AlpersT, DPT   03/04/2019, 12:29 PM

## 2019-03-04 NOTE — Progress Notes (Signed)
Occupational Therapy Weekly Progress Note  Patient Details  Name: Christy Carter MRN: [id number] Date of Birth: February 28, 1934  Beginning of progress report period: February 25, 2019 End of progress report period: March 04, 2019  Today's Date: 03/04/2019 OT Individual Time: 3903-0092 OT Individual Time Calculation (min): 60 min    Patient has met 4 of 4 short term goals.  Pt had several days of weakness and confusion this past week due to medical issues. Today she was energetic, engaged and participated very well. She needed minimal cues with technique to be able to transfer, stand and do self care at an overall min A level.    Patient continues to demonstrate the following deficits: muscle weakness, decreased cardiorespiratoy endurance, unbalanced muscle activation and decreased coordination, decreased visual perceptual skills, decreased visual motor skills and field cut, decreased attention to right, decreased memory and delayed processing and decreased standing balance, decreased postural control and hemiplegia and therefore will continue to benefit from skilled OT intervention to enhance overall performance with BADL.  Patient progressing toward long term goals..  Continue plan of care.  OT Short Term Goals Week 1:  OT Short Term Goal 1 (Week 1): Pt will don shirt with mod A OT Short Term Goal 1 - Progress (Week 1): Met OT Short Term Goal 2 (Week 1): Pt will maintain static EOB sitting balance with min A while completing ADL task OT Short Term Goal 2 - Progress (Week 1): Met OT Short Term Goal 3 (Week 1): Pt will complete LB bathing bed level with LRAD with mod A OT Short Term Goal 3 - Progress (Week 1): Met OT Short Term Goal 4 (Week 1): Pt will require only mod cueing for attending to L side of body during UB bathing OT Short Term Goal 4 - Progress (Week 1): Met Week 2:  OT Short Term Goal 1 (Week 2): Pt will be able to rise to stand to RW with min A of 1 to prepare for LB self  care. OT Short Term Goal 1 - Progress (Week 2): Progressing toward goal(Pt can rise from elevated surface with min A (bed, toilet) but needs mod A from w/c) OT Short Term Goal 2 (Week 2): Pt will be able to stand with mod A for dynamic balance as she pulls pants over hips with min A. OT Short Term Goal 2 - Progress (Week 2): Met OT Short Term Goal 3 (Week 2): Min A squat or stand pivot to toilet. OT Short Term Goal 3 - Progress (Week 2): Progressing toward goal(Pt can stand pivot to toilet with bar with mod A.) OT Short Term Goal 4 (Week 2): Pt will don shirt with min A. OT Short Term Goal 4 - Progress (Week 2): Met OT Short Term Goal 5 (Week 2): Pt will don pants with min A. OT Short Term Goal 5 - Progress (Week 2): Met Week 3:  OT Short Term Goal 1 (Week 3): Pt will be able to sit to stand from w/c with min A to prepare for ADL transfers. OT Short Term Goal 1 - Progress (Week 3): Met OT Short Term Goal 2 (Week 3): Pt will be able to maintain dynamic stand with min A as she pulls pants over her hips. OT Short Term Goal 2 - Progress (Week 3): Met OT Short Term Goal 3 (Week 3): Pt will be able to toilet with min A. OT Short Term Goal 3 - Progress (Week 3): Met OT Short Term Goal 4 (  Week 3): Pt will demonstrate improved L side awareness and praxis to stand pivot transfer with min A to her L with only min cues. OT Short Term Goal 4 - Progress (Week 3): Met Week 4:  OT Short Term Goal 1 (Week 4): STGs = LTGs with a focus on family education  Skilled Therapeutic Interventions/Progress Updates:    Pt seen this session for ADL training and family education with her daughter.  Today's session focused on demonstration to daughter on transfer, balance and self care techniques and then daughter can practice hands on during a weekend session and during sessions next week leading up to discharge.  See ADL documentation below.  Pt was able to stand up 5 x today and held her balance with CGA/ min A for several  minutes at a time.   Pt resting in wc at end of session with dtr in the room.    Therapy Documentation Precautions:  Precautions Precautions: Fall, Other (comment) Precaution Comments: L inattention, left hemiparesis Restrictions Weight Bearing Restrictions: No  Pain: Pain Assessment Pain Scale: 0-10 Pain Score: 0-No pain ADL: ADL Eating: Supervision/safety Grooming: Supervision/safety Where Assessed-Grooming: Sitting at sink Upper Body Bathing: Supervision/safety Where Assessed-Upper Body Bathing: Sitting at sink Lower Body Bathing: Contact guard Where Assessed-Lower Body Bathing: Sitting at sink Upper Body Dressing: Minimal assistance Where Assessed-Upper Body Dressing: Wheelchair Lower Body Dressing: Moderate assistance Where Assessed-Lower Body Dressing: Wheelchair Toileting: Moderate assistance Where Assessed-Toileting: Glass blower/designer: Psychiatric nurse Method: Stand Ecologist: Grab bars, Raised toilet seat  Therapy/Group: Individual Therapy  Arnold Line 03/04/2019, 12:01 PM

## 2019-03-04 NOTE — Progress Notes (Signed)
Speech Language Pathology Weekly Progress and Session Note  Patient Details  Name: Christy Carter MRN: [id number] Date of Birth: 08/08/1933  Beginning of progress report period: February 25, 2019 End of progress report period: March 04, 2019  Today's Date: 03/04/2019 SLP Individual Time: 1000-1058 SLP Individual Time Calculation (min): 58 min  Short Term Goals: Week 3: SLP Short Term Goal 1 (Week 3): Pt will consume dysphagia 2 diet with thin liquids and superivison level cues for use of compensatory swallow strategies. SLP Short Term Goal 1 - Progress (Week 3): Met SLP Short Term Goal 2 (Week 3): Pt will consume trials of dysphagia 3 with Min A cues for use of compensatory swallow strategies. SLP Short Term Goal 2 - Progress (Week 3): Met SLP Short Term Goal 3 (Week 3): Pt will locate items to the left of midline in >25% of opportunities during basic, familiar tasks with mod assist multimodal cues. SLP Short Term Goal 3 - Progress (Week 3): Met SLP Short Term Goal 4 (Week 3): Pt will utilize external aids to recall basic, daily information with mod assist verbal cues. SLP Short Term Goal 4 - Progress (Week 3): Progressing toward goal SLP Short Term Goal 5 (Week 3): Pt will complete basic, familiar tasks wtih mod assist multimodal cues for functional problem solving. SLP Short Term Goal 5 - Progress (Week 3): Progressing toward goal SLP Short Term Goal 6 (Week 3): Pt will sustain attention to basic familiar task for 15 minutes with Mod A cues for redirection to task. SLP Short Term Goal 6 - Progress (Week 3): Met    New Short Term Goals: Week 4: SLP Short Term Goal 1 (Week 4): STG=LTG due to remaining LOS  Weekly Progress Updates: Pt was with fluctuating levels of confusion and lethargy this reporting period, which lead to inconsistent performance in ST. However, she made some functional gains and met 4 out of 6 short term goals. Pt is currently Mod-Max assist for basic cognitive tasks  due to severe impairments in basic problem solving, sequencing, and left inattention. Pt was upgraded to dysphagia 3 diet and thin liquids and is currently Supervision-Min A for use of swallow strategies. Pt has demonstrated improved oral clearance of solid POs via use of her swallow strategies, selective attention, and ability to locate items at and to left of midline. Pt and family education is ongoing. Pt would continue to benefit from skilled ST while inpatient in order to maximize functional independence and reduce burden of care prior to discharge. Anticipate that pt will need 24/7 supervision at discharge in addition to [place] follow up at next level of care.     Intensity: Minumum of 1-2 x/day, 30 to 90 minutes Frequency: 3 to 5 out of 7 days Duration/Length of Stay: 03/09/19 Treatment/Interventions: Cognitive remediation/compensation;Cueing hierarchy;Functional tasks;Environmental controls;Dysphagia/aspiration precaution training;Internal/external aids;Patient/family education   Daily Session  Skilled Therapeutic Interventions: Pt was seen for skilled ST targeting dysphagia and cognitive goals. Pt's daughter was present and engaged throughout session. SLP facilitated session with upgraded dys 3 solid trial with thin liquids. No overt s/s aspiration observed throughout intake. Efficient mastication and complete oral clearance observed with only Supervision A for use of swallow strategies. Although pt's dentition is poor, pt and daughter report no difficulty consuming soft solids at home despite missing dentition. Recommend pt upgrade to dysphagia 3 (soft) diet, continue thin liquids, small meds whole with liquid (large can be broken or crushed and consumed in applesauce). Pt still required FULL supervision during  meals to ensure use of swallow strategies. SLP further facilitated session with fluctuating Mod-Max A verbal and visual cues for sequencing, error awareness, and basic problem solving during  a 3-step action card sequencing task. SLP also educated pt and daughter regarding rationale for such activity (impact of sequencing on ADLs/mobility/etc.). Pt was unable to verbally sequence steps of transferring from bed to wheelchair without Max A verbal cues (she initially stated "well I would slide up and out into the chair). SLP emphasized need for 24/7 supervision upon her return home, and pt's daughter verbally acknowledged she and other family members will be able to provide such assistance. Pt left sitting in wheelchair with alarm set and daughter still present. Continue per current plan of care.      Pain Pain Assessment Pain Scale: 0-10 Pain Score: 0-No pain  Therapy/Group: Individual Therapy  [name] 03/04/2019, 12:22 PM

## 2019-03-04 NOTE — Progress Notes (Signed)
Social Work Patient ID: Christy Carter, female   DOB: [birth year], 85 y.o.   MRN: [id number]  Daughter was here to attend therapies and begin training. Both feel it went well and pt was more oriented today. Daughter to come back next week to continue education in preparation for discharge Wed.

## 2019-03-04 NOTE — Progress Notes (Signed)
[place] PHYSICAL MEDICINE & REHABILITATION PROGRESS NOTE  Subjective/Complaints: Patient seen laying in bed this morning.  She states she slept well overnight.  She is more alert and oriented this morning with quicker responses.  Daughter at bedside, spent the night.  She has questions regarding progress.  ROS: Denies SOB, CP, N/V/D.   Objective:   No results found. No results for input(s): WBC, HGB, HCT, PLT in the last 72 hours. Recent Labs    03/04/19 0510  NA 141  K 3.8  CL 110  CO2 22  GLUCOSE 81  BUN 13  CREATININE 1.00  CALCIUM 8.7*    Intake/Output Summary (Last 24 hours) at 03/04/2019 1118 Last data filed at 03/04/2019 0934 Gross per 24 hour  Intake 360 ml  Output -  Net 360 ml     Physical Exam: Vital Signs Blood pressure (!) 129/33, pulse (!) 57, temperature 97.7 F (36.5 C), temperature source Oral, resp. rate 18, height 5\' 7"  (1.702 m), weight 104.8 kg, SpO2 100 %. Constitutional: No distress . Vital signs reviewed.  Morbidly obese. HENT: Normocephalic.  Atraumatic. Eyes: EOMI. No discharge. Cardiovascular: No JVD. Respiratory: Normal effort.  No stridor. GI: Non-distended. Skin: Warm and dry.  Intact. Psych: Somewhat slowed and confused, improving Musc: Lower extremity edema Neurological:Alert and oriented, except for date of month Motor: LUE/LLE: 4+/5 proximal to distal, unchanged RUE/RLE: 5/5 proximal distal, unchanged  Assessment/Plan: 1. Functional deficits secondary to Left hemiparesis with dysphagia/dysarthriasecondary to right watershed territory infarction in the setting of severe stenosis right M1 segment status post revascularization which require 3+ hours per day of interdisciplinary therapy in a comprehensive inpatient rehab setting.  Physiatrist is providing close team supervision and 24 hour management of active medical problems listed below.  Physiatrist and rehab team continue to assess barriers to discharge/monitor patient  progress toward functional and medical goals  Care Tool:  Bathing  Bathing activity did not occur: Safety/medical concerns Body parts bathed by patient: Right arm, Left arm, Chest, Abdomen, Front perineal area, Buttocks, Right upper leg, Left upper leg, Right lower leg, Left lower leg, Face   Body parts bathed by helper: Right lower leg, Left lower leg     Bathing assist Assist Level: Minimal Assistance - Patient > 75%     Upper Body Dressing/Undressing Upper body dressing   What is the patient wearing?: Hospital gown only    Upper body assist Assist Level: Moderate Assistance - Patient 50 - 74%    Lower Body Dressing/Undressing Lower body dressing      What is the patient wearing?: Pants, Incontinence brief     Lower body assist Assist for lower body dressing: Maximal Assistance - Patient 25 - 49%     Toileting Toileting    Toileting assist Assist for toileting: Total Assistance - Patient < 25%     Transfers Chair/bed transfer  Transfers assist     Chair/bed transfer assist level: Minimal Assistance - Patient > 75%     Locomotion Ambulation   Ambulation assist   Ambulation activity did not occur: Safety/medical concerns  Assist level: 2 helpers(min A with +2 w/c follow) Assistive device: Walker-rolling Max distance: 59ft   Walk 10 feet activity   Assist  Walk 10 feet activity did not occur: Safety/medical concerns  Assist level: 2 helpers(min A with +2 w/c follow) Assistive device: Walker-rolling   Walk 50 feet activity   Assist Walk 50 feet with 2 turns activity did not occur: Safety/medical concerns  Assist level: 2  helpers(min A with +2 w/c follow) Assistive device: Walker-rolling    Walk 150 feet activity   Assist Walk 150 feet activity did not occur: Safety/medical concerns         Walk 10 feet on uneven surface  activity   Assist Walk 10 feet on uneven surfaces activity did not occur: Safety/medical concerns          Wheelchair     Assist Will patient use wheelchair at discharge?: Yes Type of Wheelchair: Manual Wheelchair activity did not occur: Safety/medical concerns  Wheelchair assist level: Moderate Assistance - Patient 50 - 74% Max wheelchair distance: 76'    Wheelchair 50 feet with 2 turns activity    Assist    Wheelchair 50 feet with 2 turns activity did not occur: Safety/medical concerns       Wheelchair 150 feet activity     Assist  Wheelchair 150 feet activity did not occur: Safety/medical concerns       Blood pressure (!) 129/33, pulse (!) 57, temperature 97.7 F (36.5 C), temperature source Oral, resp. rate 18, height 5\' 7"  (1.702 m), weight 104.8 kg, SpO2 100 %.  Medical Problem List and Plan: 1.Left hemiplegia with dysphagia/dysarthriasecondary to right watershed territory infarction in the setting of severe stenosis right M1 segment status post revascularization as well as small left lateral posterior temporal lobe infarct in right middle cerebellar peduncle infarct  Continue CIR 2. Antithrombotics: -DVT/anticoagulation:Subcutaneous heparin -antiplatelet therapy: Aspirin 81 mg daily, Brilinta 90 mg twice daily 3. Pain Management:Tylenol as needed  Added voltaren gel for R foot 4. Mood:Seroquel 12.5 mg nightly   Fluoxetine started on 10/18 -antipsychotic agents: N/A 5. Neuropsych: She is not fully capable of making decisions onherown behalf. 6. Skin/Wound Care:Routine skin checks 7. Fluids/Electrolytes/Nutrition:Routine in and outs 8. Post strokedysphagia.   D2 nectars, advanced to D2 thins  Encourage fluids  Advance diet as tolerated  9. Hypertension.   Maxide 37.5-25 mg daily DC'd on 10/14 due to AKI  Continue clonidine patch 0.1 mg change every 7 days, Zebeta 2.5 mg daily.   Vitals:   03/03/19 1918 03/04/19 0355  BP: 129/72 (!) 129/33  Pulse: (!) 58 (!) 57  Resp: 18 18  Temp: 97.6 F (36.4 C)  97.7 F (36.5 C)  SpO2: 97% 100%   Labile, particularly diastolic blood pressures on 10/23, will consider further decrease in medications if persistent 10. Hyperlipidemia. Lipitor 11. Hypothyroidism. Synthroid  TSH very elevated, Synthroid increased on 10/21 12. R foot pain- Mid foot OA:?  Resolved 13.  Leukocytosis: Resolved  WBC 7.1 on 10/17  Afebrile  Continue to monitor 14.  AKI with acute lower UTI  Creatinine 1.0 on 10/23, labs ordered for Monday  Echo reviewed-EF 65-70% with hyperdynamic left ventricle  IVF changed to nightly on 10/15, DC'd on 10/23  Ucx Enterococcus Faecalis-started on ampicillin on 10/16-10/24 15.  Hyperglycemia  Improving on 10/20 16.  Metabolic encephalopathy  Likely multifactorial-AKA +/- hypothyroidism +/- with pseudodementia  See # 4, 14  Improving 17. Sleep disturbance  Low dose Seroquel started on 10/21, with benefit  LOS: 21 days A FACE TO FACE EVALUATION WAS PERFORMED  Sherlonda Flater [name] 03/04/2019, 11:17 AM

## 2019-03-04 NOTE — Progress Notes (Signed)
Social Work Patient ID: Christy Carter, female   DOB: 03-04-1934, 85 y.o.   MRN: [id number]    Diagnosis codes: I63.89 & G93.40  Height:  5'6              Weight:  244 lbs          Patient suffers from [facility] CVA   which impairs her ability to perform daily activities like ADL's and tolieting   in the home.  A walker  will not resolve issue with performing activities of daily living.  A wheelchair will allow patient to safely perform daily activities.  Patient is not able to propel themselves in the home using a standard weight wheelchair due to endurance and fatigue .  Patient can self propel in the lightweight wheelchair.

## 2019-03-05 ENCOUNTER — Inpatient Hospital Stay (HOSPITAL_COMMUNITY): Payer: Medicare Other

## 2019-03-05 NOTE — Progress Notes (Signed)
[place] PHYSICAL MEDICINE & REHABILITATION PROGRESS NOTE  Subjective/Complaints: Up in chair eating breakfast. Gets emotional talking about being away from home  ROS: Patient denies fever, rash, sore throat, blurred vision, nausea, vomiting, diarrhea, cough, shortness of breath or chest pain, joint or back pain, headache, or mood change.    Objective:   No results found. No results for input(s): WBC, HGB, HCT, PLT in the last 72 hours. Recent Labs    03/04/19 0510  NA 141  K 3.8  CL 110  CO2 22  GLUCOSE 81  BUN 13  CREATININE 1.00  CALCIUM 8.7*    Intake/Output Summary (Last 24 hours) at 03/05/2019 1002 Last data filed at 03/05/2019 0752 Gross per 24 hour  Intake 660 ml  Output -  Net 660 ml     Physical Exam: Vital Signs Blood pressure (!) 145/60, pulse 60, temperature (!) 97.4 F (36.3 C), temperature source Oral, resp. rate 19, height 5\' 7"  (1.702 m), weight 104.8 kg, SpO2 100 %. Constitutional: No distress . Vital signs reviewed. HEENT: EOMI, oral membranes moist Neck: supple Cardiovascular: RRR without murmur. No JVD    Respiratory: CTA Bilaterally without wheezes or rales. Normal effort    GI: BS +, non-tender, non-distended  Skin: Warm and dry.  Intact. Psych: pleasant, emotional Musc: Lower extremity edema Neurological:Alert and oriented, except for date of month, improving insight. Knew that she was going home next week Motor: LUE/LLE: 4+/5 proximal to distal, unchanged RUE/RLE: 5/5 proximal distal, unchanged  Assessment/Plan: 1. Functional deficits secondary to Left hemiparesis with dysphagia/dysarthriasecondary to right watershed territory infarction in the setting of severe stenosis right M1 segment status post revascularization which require 3+ hours per day of interdisciplinary therapy in a comprehensive inpatient rehab setting.  Physiatrist is providing close team supervision and 24 hour management of active medical problems listed  below.  Physiatrist and rehab team continue to assess barriers to discharge/monitor patient progress toward functional and medical goals  Care Tool:  Bathing  Bathing activity did not occur: Safety/medical concerns Body parts bathed by patient: Right arm, Left arm, Chest, Abdomen, Front perineal area, Buttocks, Right upper leg, Left upper leg, Right lower leg, Left lower leg, Face   Body parts bathed by helper: Right lower leg, Left lower leg     Bathing assist Assist Level: Contact Guard/Touching assist     Upper Body Dressing/Undressing Upper body dressing   What is the patient wearing?: Bra, Pull over shirt    Upper body assist Assist Level: Minimal Assistance - Patient > 75%    Lower Body Dressing/Undressing Lower body dressing      What is the patient wearing?: Pants, Incontinence brief     Lower body assist Assist for lower body dressing: Minimal Assistance - Patient > 75%     Toileting Toileting    Toileting assist Assist for toileting: Total Assistance - Patient < 25%     Transfers Chair/bed transfer  Transfers assist     Chair/bed transfer assist level: Moderate Assistance - Patient 50 - 74%     Locomotion Ambulation   Ambulation assist   Ambulation activity did not occur: Safety/medical concerns  Assist level: 2 helpers Assistive device: Walker-rolling Max distance: 51ft   Walk 10 feet activity   Assist  Walk 10 feet activity did not occur: Safety/medical concerns  Assist level: 2 helpers Assistive device: Walker-rolling   Walk 50 feet activity   Assist Walk 50 feet with 2 turns activity did not occur: Safety/medical concerns  Assist level: 2 helpers Assistive device: Walker-rolling    Walk 150 feet activity   Assist Walk 150 feet activity did not occur: Safety/medical concerns         Walk 10 feet on uneven surface  activity   Assist Walk 10 feet on uneven surfaces activity did not occur: Safety/medical  concerns         Wheelchair     Assist Will patient use wheelchair at discharge?: Yes Type of Wheelchair: Manual Wheelchair activity did not occur: Safety/medical concerns  Wheelchair assist level: Moderate Assistance - Patient 50 - 74% Max wheelchair distance: 24'    Wheelchair 50 feet with 2 turns activity    Assist    Wheelchair 50 feet with 2 turns activity did not occur: Safety/medical concerns       Wheelchair 150 feet activity     Assist  Wheelchair 150 feet activity did not occur: Safety/medical concerns       Blood pressure (!) 145/60, pulse 60, temperature (!) 97.4 F (36.3 C), temperature source Oral, resp. rate 19, height 5\' 7"  (1.702 m), weight 104.8 kg, SpO2 100 %.  Medical Problem List and Plan: 1.Left hemiplegia with dysphagia/dysarthriasecondary to right watershed territory infarction in the setting of severe stenosis right M1 segment status post revascularization as well as small left lateral posterior temporal lobe infarct in right middle cerebellar peduncle infarct  Continue CIR PT, OT, SLP 2. Antithrombotics: -DVT/anticoagulation:Subcutaneous heparin -antiplatelet therapy: Aspirin 81 mg daily, Brilinta 90 mg twice daily 3. Pain Management:Tylenol as needed  Added voltaren gel for R foot 4. Mood:Seroquel 12.5 mg nightly  -Fluoxetine started on 10/18  -team providing ego support also -antipsychotic agents: N/A 5. Neuropsych: She is not fully capable of making decisions onherown behalf. 6. Skin/Wound Care:Routine skin checks 7. Fluids/Electrolytes/Nutrition:Routine in and outs 8. Post strokedysphagia.   D2 nectars, advanced to D3 thins  Encourage fluids  Advance diet as tolerated  9. Hypertension.   Maxide 37.5-25 mg daily DC'd on 10/14 due to AKI  Continue clonidine patch 0.1 mg change every 7 days, Zebeta 2.5 mg daily.   Vitals:   03/04/19 1920 03/05/19 0553  BP: (!) 144/47 (!)  145/60  Pulse: (!) 59 60  Resp: 18 19  Temp: 98.5 F (36.9 C) (!) 97.4 F (36.3 C)  SpO2: 100% 100%   Fair control 10/24---no changes today 10. Hyperlipidemia. Lipitor 11. Hypothyroidism. Synthroid  TSH very elevated, Synthroid increased on 10/21 12. R foot pain- Mid foot OA:?  Resolved 13.  Leukocytosis: Resolved  WBC 7.1 on 10/17  Afebrile  Continue to monitor 14.  AKI with acute lower UTI  Creatinine 1.0 on 10/23, labs ordered for Monday  Echo reviewed-EF 65-70% with hyperdynamic left ventricle  IVF changed to nightly on 10/15, DC'd on 10/23  Ucx Enterococcus Faecalis-started on ampicillin on 10/16-10/24 15.  Hyperglycemia  Improving on 10/20 16.  Metabolic encephalopathy  Likely multifactorial-AKA +/- hypothyroidism +/- with pseudodementia  See # 4, 14  Showing signs of improvement 17. Sleep disturbance  Low dose Seroquel started on 10/21, with benefit  LOS: 22 days A FACE TO [place] 03/05/2019, 10:02 AM

## 2019-03-05 NOTE — Progress Notes (Signed)
Speech Language Pathology Daily Session Note  Patient Details  Name: Christy Carter MRN: [id number] Date of Birth: 20-Sep-1933  Today's Date: 03/05/2019 SLP Individual Time: 0950-1030 SLP Individual Time Calculation (min): 40 min  Short Term Goals: Week 4: SLP Short Term Goal 1 (Week 4): STG=LTG due to remaining LOS  Skilled Therapeutic Interventions: Skilled ST services focused on swallow and cognitive skills.  Pt expressed intermittent confusion and was emotionally liable about where she was last night " I walked from home here." SLP provided education and emotional support. Pt was orientated to situation and required min A visual aids for place and time. SLP facilitated basic problem solving and error awareness in 3 step sequencing task, pt required initial max A verbal cues fading to mod A verbal cues when requested to provide verbal sequencing prior to functional manipulation of cards. Pt consumed dys 3 trial snack with ability to clear left oral residue and supervision A verbal cues to cease verbalization during mastication. Pt was left in room with call bell within reach and chair alarm set. ST recommends to continue skilled ST services.      Pain Pain Assessment Pain Score: 0-No pain  Therapy/Group: Individual Therapy  Egon Dittus  [facility] 03/05/2019, 12:58 PM

## 2019-03-05 NOTE — Progress Notes (Signed)
Patient is confused tonight. At time of medication pass, she kept talking as if she was leaving to go elsewhere & not in the hospital. At one point, she asked where she was. She was reoriented but was still getting some things mixed up. She did not remember that her nurse was the same nurse as last night. A few minutes ago, she rang the cal bell stating that she was ready to go. The nurse tech went to the room & was also told that she as leaving tonight. Her vitals were WNL. No c/o pain or discomfort. Will continue to monitor & reorient

## 2019-03-06 ENCOUNTER — Ambulatory Visit (HOSPITAL_COMMUNITY): Payer: Medicare Other | Admitting: Physical Therapy

## 2019-03-06 MED ORDER — QUETIAPINE FUMARATE 25 MG PO TABS
25.0000 mg | ORAL_TABLET | Freq: Every day | ORAL | Status: DC
  Administered 2019-03-06 – 2019-03-08 (×3): 25 mg via ORAL
  Filled 2019-03-06 (×3): qty 1

## 2019-03-06 NOTE — Progress Notes (Signed)
Physical Therapy Session Note  Patient Details  Name: [name] MRN: [id number] Date of Birth: 11-28-1933  Today's Date: 03/06/2019 PT Individual Time: 1010-1108 PT Individual Time Calculation (min): 58 min   Short Term Goals: Week 4:  PT Short Term Goal 1 (Week 4): = to LTGs based on ELOS  Skilled Therapeutic Interventions/Progress Updates:    Pt received sitting in her Christy/c with her daughter present again for additional hands-on training and pt agreeable to therapy session. Pt's daughter reports that she is in the process of having a ramp built for entrance into her home. Pt's daughter set-up for stand pivot transfer Christy/c>EOB using RW with mod cuing for proper set-up of transfer. Pt stand pivot Christy/c>EOB using RW with mod assist from daughter for lifting into standing and min/mod assist for balance while turning - pt continues to require mod cuing for sequencing of this task with therapist training the daughter on how to properly cue patient. Sit>supine, HOB flat and not using bedrails, with min assist from daughter for LE management - pt's daughter reports that she is planning on getting pt a bed with an adjustable frame and defers getting a hospital bed at this time. Supine>sit, no bedrails, with min assist from daughter for pivoting hips and trunk upright with mod cuing from therapist on proper sequencing of task to increase pt independence and decrease caregiver burden. Discussed trying lateral scoot/squat pivot transfers in event pt's daughter felt pt was requiring too much assistance for it to be safe to do stand pivot transfers. Pt performed L lateral scoot/squat pivot transfer EOB>Christy/c with max cuing for sequencing as pt initiated standing and min/mod assist from daughter for pivoting pt's hips - recommended doing stand pivot transfers with RW at this time for increased pt/family safety.  Transported to/from gym in Christy/c. Pt's daughter set-up for stand pivot car transfer with min cuing for proper  set-up. Pt stand pivot Christy/c<>simulated car (sedan height) using RW with pt's daughter providing mod assist for lifting into standing and min/mod assist for balance while turning to sit - again therapist educating pt's daughter on proper cuing for the pt to have increased independence with transfer - therapist educated pt/daughter to have a 3rd person always present for this transfer to increase safety. Pt transported back to room in Christy/c and pt left sitting with needs in reach, seat belt alarm on, and pt's daughter present.  Therapy Documentation Precautions:  Precautions Precautions: Fall, Other (comment) Precaution Comments: L inattention, left hemiparesis Restrictions Weight Bearing Restrictions: No  Pain:   Denies pain during session.   Therapy/Group: Individual Therapy  Tawana Scale, PT, DPT 03/06/2019, 7:56 AM

## 2019-03-06 NOTE — Progress Notes (Signed)
Patient was confused again last night. She stated that she hadn't see her husband in a long time & that he was helping to fix the house. Her husband dies years ago. As the conversation went along, it seemed like she got clearer. When asked about whether her daughter came to see her yesterday, she replied that she was tired & busy getting the house together for her to come home. She tried to get out of bed 3 times without assistance, including just previously. The time before that, she was noted at the foot of the bed bending over to get some blankets that she had somehow pushed onto the floor. She disoriented, but able to be redirected. She had picked at her brief to where the stuffing was on the bed & she seemed to have abrased her right lower abdomen above her groin. The area was bleeding, probably from her pulling the brief & cutting herself with the fastener on that side. The area was cleased & moisture barrier applied for now. Another staff member was called for & she was moved up in bed, a new brief was applied. Last night after shift change during rounds, patient was noted with her seat belt chair arm wrapped around her hand. The velcro was still intact, but she took apart the seat belt section at the buckle. At that time, this nurse & another nurse toileted patient & put her in the bed. No acute distress noted. She was just discouraged against getting out of bed at 0500. Will continue to monitor

## 2019-03-06 NOTE — Progress Notes (Signed)
[place] PHYSICAL MEDICINE & REHABILITATION PROGRESS NOTE  Subjective/Complaints: Pt with confusion last night. She did better when reoriented. Pt doesn't recall having had any issues  ROS: Patient denies fever, rash, sore throat, blurred vision, nausea, vomiting, diarrhea, cough, shortness of breath or chest pain, joint or back pain, headache, or mood change.   Objective:   No results found. No results for input(s): WBC, HGB, HCT, PLT in the last 72 hours. Recent Labs    03/04/19 0510  NA 141  K 3.8  CL 110  CO2 22  GLUCOSE 81  BUN 13  CREATININE 1.00  CALCIUM 8.7*    Intake/Output Summary (Last 24 hours) at 03/06/2019 0938 Last data filed at 03/06/2019 0844 Gross per 24 hour  Intake 582 ml  Output -  Net 582 ml     Physical Exam: Vital Signs Blood pressure (!) 147/52, pulse (!) 55, temperature 98.1 F (36.7 C), resp. rate 18, height 5\' 7"  (1.702 m), weight 104.8 kg, SpO2 96 %. Constitutional: No distress . Vital signs reviewed. HEENT: EOMI, oral membranes moist Neck: supple Cardiovascular: RRR without murmur. No JVD    Respiratory: CTA Bilaterally without wheezes or rales. Normal effort    GI: BS +, non-tender, non-distended  Skin: Warm and dry.  Intact. Psych: pleasant, emotional Musc: Lower extremity edema Neurological:Alert and oriented to person, place, month. Knew that she's going home this week. Motor: LUE/LLE: 4+/5 proximal to distal, unchanged RUE/RLE: 5/5 proximal distal, unchanged  Assessment/Plan: 1. Functional deficits secondary to Left hemiparesis with dysphagia/dysarthriasecondary to right watershed territory infarction in the setting of severe stenosis right M1 segment status post revascularization which require 3+ hours per day of interdisciplinary therapy in a comprehensive inpatient rehab setting.  Physiatrist is providing close team supervision and 24 hour management of active medical problems listed below.  Physiatrist and rehab team  continue to assess barriers to discharge/monitor patient progress toward functional and medical goals  Care Tool:  Bathing  Bathing activity did not occur: Safety/medical concerns Body parts bathed by patient: Right arm, Left arm, Chest, Abdomen, Front perineal area, Buttocks, Right upper leg, Left upper leg, Right lower leg, Left lower leg, Face   Body parts bathed by helper: Right lower leg, Left lower leg     Bathing assist Assist Level: Contact Guard/Touching assist     Upper Body Dressing/Undressing Upper body dressing   What is the patient wearing?: Bra, Pull over shirt    Upper body assist Assist Level: Minimal Assistance - Patient > 75%    Lower Body Dressing/Undressing Lower body dressing      What is the patient wearing?: Pants, Incontinence brief     Lower body assist Assist for lower body dressing: Minimal Assistance - Patient > 75%     Toileting Toileting    Toileting assist Assist for toileting: Total Assistance - Patient < 25%     Transfers Chair/bed transfer  Transfers assist     Chair/bed transfer assist level: Moderate Assistance - Patient 50 - 74%     Locomotion Ambulation   Ambulation assist   Ambulation activity did not occur: Safety/medical concerns  Assist level: 2 helpers Assistive device: Walker-rolling Max distance: 33ft   Walk 10 feet activity   Assist  Walk 10 feet activity did not occur: Safety/medical concerns  Assist level: 2 helpers Assistive device: Walker-rolling   Walk 50 feet activity   Assist Walk 50 feet with 2 turns activity did not occur: Safety/medical concerns  Assist level: 2 helpers  Assistive device: Walker-rolling    Walk 150 feet activity   Assist Walk 150 feet activity did not occur: Safety/medical concerns         Walk 10 feet on uneven surface  activity   Assist Walk 10 feet on uneven surfaces activity did not occur: Safety/medical concerns         Wheelchair     Assist  Will patient use wheelchair at discharge?: Yes Type of Wheelchair: Manual Wheelchair activity did not occur: Safety/medical concerns  Wheelchair assist level: Moderate Assistance - Patient 50 - 74% Max wheelchair distance: 5'    Wheelchair 50 feet with 2 turns activity    Assist    Wheelchair 50 feet with 2 turns activity did not occur: Safety/medical concerns       Wheelchair 150 feet activity     Assist  Wheelchair 150 feet activity did not occur: Safety/medical concerns       Blood pressure (!) 147/52, pulse (!) 55, temperature 98.1 F (36.7 C), resp. rate 18, height 5\' 7"  (1.702 m), weight 104.8 kg, SpO2 96 %.  Medical Problem List and Plan: 1.Left hemiplegia with dysphagia/dysarthriasecondary to right watershed territory infarction in the setting of severe stenosis right M1 segment status post revascularization as well as small left lateral posterior temporal lobe infarct in right middle cerebellar peduncle infarct  Continue CIR PT, OT, SLP 2. Antithrombotics: -DVT/anticoagulation:Subcutaneous heparin -antiplatelet therapy: Aspirin 81 mg daily, Brilinta 90 mg twice daily 3. Pain Management:Tylenol as needed  Added voltaren gel for R foot 4. Mood:Seroquel 12.5 mg nightly  -Fluoxetine started on 10/18  -team providing ego support also -antipsychotic agents: N/A 5. Neuropsych: She is not fully capable of making decisions onherown behalf. 6. Skin/Wound Care:Routine skin checks 7. Fluids/Electrolytes/Nutrition:Routine in and outs 8. Post strokedysphagia.   D2 nectars, advanced to D3 thins  Encourage fluids  Advance diet as tolerated  9. Hypertension.   Maxide 37.5-25 mg daily DC'd on 10/14 due to AKI  Continue clonidine patch 0.1 mg change every 7 days, Zebeta 2.5 mg daily.   Vitals:   03/05/19 2001 03/06/19 0550  BP: (!) 149/56 (!) 147/52  Pulse: 62 (!) 55  Resp:  18  Temp:  98.1 F (36.7 C)  SpO2:  96%    Fair control 10/25 10. Hyperlipidemia. Lipitor 11. Hypothyroidism. Synthroid  TSH very elevated, Synthroid increased on 10/21 12. R foot pain- Mid foot OA:?  Resolved 13.  Leukocytosis: Resolved  WBC 7.1 on 10/17  Afebrile  Continue to monitor 14.  AKI with acute lower UTI  Creatinine 1.0 on 10/23, labs ordered for Monday  Echo reviewed-EF 65-70% with hyperdynamic left ventricle  IVF changed to nightly on 10/15, DC'd on 10/23  Ucx Enterococcus Faecalis-started on ampicillin on 10/16-10/24 15.  Hyperglycemia  Improving on 10/20 16.  Metabolic encephalopathy  Likely multifactorial-AKA +/- hypothyroidism +/- with pseudodementia  See # 4, 14  Showing signs of improvement 17. Sleep disturbance  Low dose Seroquel started on 10/21, will try 25mg  tonight given ongoing hs confusion/insomnia  LOS: 23 days A FACE TO [place] 03/06/2019, 9:38 AM

## 2019-03-07 ENCOUNTER — Inpatient Hospital Stay (HOSPITAL_COMMUNITY): Payer: Medicare Other

## 2019-03-07 ENCOUNTER — Inpatient Hospital Stay (HOSPITAL_COMMUNITY): Payer: Medicare Other | Admitting: Speech Pathology

## 2019-03-07 ENCOUNTER — Inpatient Hospital Stay (HOSPITAL_COMMUNITY): Payer: Medicare Other | Admitting: Occupational Therapy

## 2019-03-07 LAB — CBC WITH DIFFERENTIAL/PLATELET
Abs Immature Granulocytes: 0.09 10*3/uL — ABNORMAL HIGH (ref 0.00–0.07)
Basophils Absolute: 0.1 10*3/uL (ref 0.0–0.1)
Basophils Relative: 1 %
Eosinophils Absolute: 0.6 10*3/uL — ABNORMAL HIGH (ref 0.0–0.5)
Eosinophils Relative: 9 %
HCT: 42.5 % (ref 36.0–46.0)
Hemoglobin: 13.1 g/dL (ref 12.0–15.0)
Immature Granulocytes: 2 %
Lymphocytes Relative: 22 %
Lymphs Abs: 1.3 10*3/uL (ref 0.7–4.0)
MCH: 29.8 pg (ref 26.0–34.0)
MCHC: 30.8 g/dL (ref 30.0–36.0)
MCV: 96.6 fL (ref 80.0–100.0)
Monocytes Absolute: 0.6 10*3/uL (ref 0.1–1.0)
Monocytes Relative: 9 %
Neutro Abs: 3.5 10*3/uL (ref 1.7–7.7)
Neutrophils Relative %: 57 %
Platelets: 210 10*3/uL (ref 150–400)
RBC: 4.4 MIL/uL (ref 3.87–5.11)
RDW: 17.2 % — ABNORMAL HIGH (ref 11.5–15.5)
WBC: 6.1 10*3/uL (ref 4.0–10.5)
nRBC: 0 % (ref 0.0–0.2)

## 2019-03-07 LAB — BASIC METABOLIC PANEL
Anion gap: 10 (ref 5–15)
BUN: 17 mg/dL (ref 8–23)
CO2: 26 mmol/L (ref 22–32)
Calcium: 9.3 mg/dL (ref 8.9–10.3)
Chloride: 104 mmol/L (ref 98–111)
Creatinine, Ser: 1.23 mg/dL — ABNORMAL HIGH (ref 0.44–1.00)
GFR calc Af Amer: 47 mL/min — ABNORMAL LOW (ref >60)
GFR calc non Af Amer: 41 mL/min — ABNORMAL LOW (ref >60)
Glucose, Bld: 92 mg/dL (ref 70–99)
Potassium: 4.3 mmol/L (ref 3.5–5.1)
Sodium: 140 mmol/L (ref 135–145)

## 2019-03-07 NOTE — Discharge Summary (Addendum)
Physician Discharge Summary  Patient ID: Christy Carter MRN: [id number] DOB/AGE: November 05, 1933 85 y.o.  Admit date: 02/11/2019 Discharge date: 03/09/2019  Discharge Diagnoses:  Principal Problem:   Acute bilat watershed infarction [facility]) Active Problems:   Leukocytosis   Dysphagia, post-stroke   AKI (acute kidney injury) ([place])   Hyperglycemia   Encephalopathy   Metabolic encephalopathy   Labile blood pressure   Benign essential HTN   Acute lower UTI   Pseudodementia   Sleep disturbance DVT prophylaxis History of gout  Discharged Condition: Stable  Significant Diagnostic Studies: Dg Chest 2 View  Result Date: 02/17/2019 CLINICAL DATA:  Stroke. EXAM: CHEST - 2 VIEW COMPARISON:  Radiographs of February 11, 2019. FINDINGS: The heart size and mediastinal contours are within normal limits. Both lungs are clear. The visualized skeletal structures are unremarkable. IMPRESSION: No active cardiopulmonary disease. Electronically Signed   By: Marijo Conception M.D.   On: 02/17/2019 12:05   Dg Chest 2 View  Result Date: 02/11/2019 CLINICAL DATA:  Shortness of breath and chest tightness today. EXAM: CHEST - 2 VIEW COMPARISON:  PA and lateral chest 02/08/2019. Single-view of the chest 02/28/2018. FINDINGS: There is cardiomegaly and mild interstitial edema. Small bilateral pleural effusions are seen. No acute or focal bony abnormality. IMPRESSION: Cardiomegaly and mild interstitial edema with associated small pleural effusions. Electronically Signed   By: Inge Rise M.D.   On: 02/11/2019 16:50   Dg Chest 2 View  Result Date: 02/08/2019 CLINICAL DATA:  Shortness of breath. Follow-up pulmonary edema. EXAM: CHEST - 2 VIEW COMPARISON:  02/07/2019 FINDINGS: Heart size remains stable. Aortic atherosclerosis. Diffuse interstitial infiltrates show improvement since previous study, likely due to decreased interstitial edema. Low lung volumes and bibasilar atelectasis show no significant change. Probable  small left pleural effusion again noted. IMPRESSION: Interval improvement in diffuse interstitial infiltrates, likely due to decreased interstitial edema. No significant change in bibasilar atelectasis and probable small left pleural effusion. Electronically Signed   By: Marlaine Hind M.D.   On: 02/08/2019 09:40   Mr Angio Head Wo Contrast  Result Date: 02/07/2019 CLINICAL DATA:  Follow-up right MCA angioplasty x3 02/05/2019. EXAM: MRI HEAD WITHOUT CONTRAST MRA HEAD WITHOUT CONTRAST TECHNIQUE: Multiplanar, multiecho pulse sequences of the brain and surrounding structures were obtained without intravenous contrast. Angiographic images of the head were obtained using MRA technique without contrast. CORONARY ARTERIES: CT angiogram head/neck and CT brain perfusion 02/05/2019, MRI/MRA brain 02/05/2019 FINDINGS: MRI HEAD FINDINGS Brain: Multiple sequences are significantly motion degraded, limiting evaluation. Persistent restricted diffusion at site of a deep white matter subacute infarct on the right. Corresponding T2/FLAIR hyperintensity at this site. The additional previously demonstrated recent small cerebral infarcts are no longer well appreciated on motion degraded diffusion-weighted imaging. There is a new punctate acute/early subacute infarct within the right temporal occipital lobe (series 2, image 25). Additionally, there is an 11 mm focus of subtle apparent restricted diffusion within the right middle cerebellar peduncle which may reflect an acute/early subacute infarct versus artifact (series 2, image 18). No evidence of intracranial mass. No midline shift or extra-axial fluid collection. There are few chronic microhemorrhages along the margin of the posterior body/atrium of the right lateral ventricle. Moderate scattered and confluent T2/FLAIR hyperintensity within the cerebral white matter is nonspecific, but consistent with chronic small vessel ischemic disease. Redemonstrated small chronic lacunar  infarcts within the right cerebellum. Mild generalized parenchymal atrophy. Unchanged retrocerebellar cyst Vascular: Reported separately Skull and upper cervical spine: Normal marrow signal. Sinuses/Orbits: Visualized orbits  demonstrate no acute abnormality. Mild ethmoid sinus mucosal thickening. No significant mastoid effusion. MRA HEAD FINDINGS MR angiography of the head is significantly motion degraded, limiting evaluation. The intracranial internal carotid arteries are patent without evidence of high-grade stenosis. There is persistent apparent severe focal stenosis within the M1 right middle cerebral artery (series 652, image 10). Flow related signal within the more distal right MCA branches. The right anterior cerebral artery is patent without high-grade proximal stenosis. As before, the A1 left anterior cerebral artery is occluded. The left middle cerebral artery is patent without high-grade proximal a stenosis. No significant flow related signal is demonstrated within the intracranial right vertebral artery. The cervical right vertebral artery was occluded on prior CTA, and this likely reflects retrograde flow within the intracranial right vertebral artery. The left vertebral artery is patent without significant stenosis. Redemonstrated moderate (approximately 50%) stenosis within the caudal basilar artery. Distal to this, the basilar artery is widely patent. Moderate stenoses within the bilateral posterior cerebral arteries at the P1-P2 junctions. These results were called by telephone at the time of interpretation on 02/07/2019 at 11:53 am to provider Dr. Leonie Man, who verbally acknowledged these results. IMPRESSION: MRI brain: 1. Significantly motion degraded and limited examination. 2. New punctate acute/early subacute infarct within the right temporal occipital lobe. 3. 11 mm focus of subtle apparent restricted diffusion within the right middle cerebellar peduncle. An acute/early subacute infarct cannot be  excluded, although the diffusion abnormality is subtle and this may reflect artifact. 4. Persistent restricted diffusion at site of subacute deep white matter infarct on the right. 5. Atrophy and chronic small vessel ischemic disease. Redemonstrated chronic right cerebellar lacunar infarcts. MRA head: 1. Significantly motion degraded examination. 2. Persistent apparent severe focal stenosis within the M1 right middle cerebral artery. 3. Redemonstrated occlusion of the A1 left anterior cerebral artery. 4. Suspected retrograde flow within the intracranial right vertebral artery. The right cervical vertebral artery was a occluded on recent prior CTA. 5. Moderate stenoses within the bilateral posterior cerebral arteries at the P1-P2 junctions. Electronically Signed   By: Kellie Simmering   On: 02/07/2019 11:53   Mr Brain Wo Contrast  Result Date: 02/07/2019 CLINICAL DATA:  Follow-up right MCA angioplasty x3 02/05/2019. EXAM: MRI HEAD WITHOUT CONTRAST MRA HEAD WITHOUT CONTRAST TECHNIQUE: Multiplanar, multiecho pulse sequences of the brain and surrounding structures were obtained without intravenous contrast. Angiographic images of the head were obtained using MRA technique without contrast. CORONARY ARTERIES: CT angiogram head/neck and CT brain perfusion 02/05/2019, MRI/MRA brain 02/05/2019 FINDINGS: MRI HEAD FINDINGS Brain: Multiple sequences are significantly motion degraded, limiting evaluation. Persistent restricted diffusion at site of a deep white matter subacute infarct on the right. Corresponding T2/FLAIR hyperintensity at this site. The additional previously demonstrated recent small cerebral infarcts are no longer well appreciated on motion degraded diffusion-weighted imaging. There is a new punctate acute/early subacute infarct within the right temporal occipital lobe (series 2, image 25). Additionally, there is an 11 mm focus of subtle apparent restricted diffusion within the right middle cerebellar peduncle  which may reflect an acute/early subacute infarct versus artifact (series 2, image 18). No evidence of intracranial mass. No midline shift or extra-axial fluid collection. There are few chronic microhemorrhages along the margin of the posterior body/atrium of the right lateral ventricle. Moderate scattered and confluent T2/FLAIR hyperintensity within the cerebral white matter is nonspecific, but consistent with chronic small vessel ischemic disease. Redemonstrated small chronic lacunar infarcts within the right cerebellum. Mild generalized parenchymal atrophy. Unchanged retrocerebellar cyst  Vascular: Reported separately Skull and upper cervical spine: Normal marrow signal. Sinuses/Orbits: Visualized orbits demonstrate no acute abnormality. Mild ethmoid sinus mucosal thickening. No significant mastoid effusion. MRA HEAD FINDINGS MR angiography of the head is significantly motion degraded, limiting evaluation. The intracranial internal carotid arteries are patent without evidence of high-grade stenosis. There is persistent apparent severe focal stenosis within the M1 right middle cerebral artery (series 652, image 10). Flow related signal within the more distal right MCA branches. The right anterior cerebral artery is patent without high-grade proximal stenosis. As before, the A1 left anterior cerebral artery is occluded. The left middle cerebral artery is patent without high-grade proximal a stenosis. No significant flow related signal is demonstrated within the intracranial right vertebral artery. The cervical right vertebral artery was occluded on prior CTA, and this likely reflects retrograde flow within the intracranial right vertebral artery. The left vertebral artery is patent without significant stenosis. Redemonstrated moderate (approximately 50%) stenosis within the caudal basilar artery. Distal to this, the basilar artery is widely patent. Moderate stenoses within the bilateral posterior cerebral arteries  at the P1-P2 junctions. These results were called by telephone at the time of interpretation on 02/07/2019 at 11:53 am to provider Dr. Leonie Man, who verbally acknowledged these results. IMPRESSION: MRI brain: 1. Significantly motion degraded and limited examination. 2. New punctate acute/early subacute infarct within the right temporal occipital lobe. 3. 11 mm focus of subtle apparent restricted diffusion within the right middle cerebellar peduncle. An acute/early subacute infarct cannot be excluded, although the diffusion abnormality is subtle and this may reflect artifact. 4. Persistent restricted diffusion at site of subacute deep white matter infarct on the right. 5. Atrophy and chronic small vessel ischemic disease. Redemonstrated chronic right cerebellar lacunar infarcts. MRA head: 1. Significantly motion degraded examination. 2. Persistent apparent severe focal stenosis within the M1 right middle cerebral artery. 3. Redemonstrated occlusion of the A1 left anterior cerebral artery. 4. Suspected retrograde flow within the intracranial right vertebral artery. The right cervical vertebral artery was a occluded on recent prior CTA. 5. Moderate stenoses within the bilateral posterior cerebral arteries at the P1-P2 junctions. Electronically Signed   By: Kellie Simmering   On: 02/07/2019 11:53   Dg Foot 2 Views Right  Result Date: 02/13/2019 CLINICAL DATA:  85 year old female with history of right foot pain aggravated by exercise. EXAM: RIGHT FOOT - 2 VIEW COMPARISON:  12/24/2008. FINDINGS: Two views of the right foot demonstrate no acute displaced fracture, subluxation or dislocation. Mild multifocal joint space narrowing, subchondral sclerosis and osteophyte formation indicative of osteoarthritis, most evident in the midfoot. Small plantar calcaneal spur incidentally noted. IMPRESSION: 1. No acute radiographic abnormality of the right foot. 2. Mild multifocal osteoarthritis, most severe in the midfoot. Electronically  Signed   By: Vinnie Langton M.D.   On: 02/13/2019 19:18   Dg Swallowing Func-speech Pathology  Result Date: 02/11/2019 Objective Swallowing Evaluation: Type of Study: MBS-Modified Barium Swallow Study  Patient Details Name: KOLLEEN DURRETTE MRN: [id number] Date of Birth: 05/17/1933 Today's Date: 02/11/2019 Time: SLP Start Time (ACUTE ONLY): 1320 -SLP Stop Time (ACUTE ONLY): 1340 SLP Time Calculation (min) (ACUTE ONLY): 20 min Past Medical History: Past Medical History: Diagnosis Date . Arthritis  . Carpal tunnel syndrome  . Complication of anesthesia   Difficulty waking up. Believes it took 2 days to wake up after her surgery. . Depression  . Hyperlipidemia  . Hypertension  . Shortness of breath  . Stroke ([place])  . Thyroid disease  Past  Surgical History: Past Surgical History: Procedure Laterality Date . APPENDECTOMY   . BACK SURGERY   . CHOLECYSTECTOMY   . HERNIA REPAIR   . IR CT HEAD LTD  02/05/2019 . IR PTA INTRACRANIAL  02/05/2019 . RADIOLOGY WITH ANESTHESIA N/A 02/05/2019  Procedure: CODE STROKE;  Surgeon: Radiologist, Medication, MD;  Location: [place];  Service: Radiology;  Laterality: N/A; HPI: MARGRIT WIGLEY is a 85 y.o. female PMH of sleep apnea, HTN, HLD, carpal tunnel, stroke who presented to [facility] on 9/24 for SOB. She was admitted by the medical services there, noted to have a pneumonia/UTI. She was noted to have some left sided weakness on initial exam, and again today, with LKW documented to be 0200 02/05/2019. Further imaging was done in the form of MRI of the brain after a CT showed no acute changes.  The MRI brain showed bilateral small acute infarct with the largest being in the deep white matter watershed territory on the right. The neurologist over at [facility] regional discussed with the family who wanted aggressive care and she was transferred directly to the IR suite at [facility].  CXR 9/24: vascular congestion; "Streaky bibasilar opacities, favor atelectasis".  MRI 9/26:  "  1. Acute  infarct deep white matter on the right in a watershed territory, likely due to severe stenosis right M1 segment. 2. Small acute infarct left lateral posterior temporal lobe 3. Carotid bifurcation widely patent in the neck. Mild to moderate stenosis of the left petrous carotid. 4. Left vertebral artery widely patent. Right vertebral artery severely diseased with long segment occlusion and partial reconstitution distally. 5. Moderate to severe stenosis right P1. Severe stenosis right P2 segment. Moderate stenosis left P1 segment. 6. Occluded left A1 segment. Severe stenosis right M1 segment. Mild to moderate stenosis left M1."  Subjective: Pt awake, alert, pleasant, participative Assessment / Plan / Recommendation CHL IP CLINICAL IMPRESSIONS 02/11/2019 Clinical Impression Pt demonstrates a mild oropharyngeal dysphagia with slightly delayed laryngeal closure. Laryngeal elevation is timely, but the vestibule remains exposed as thin liquids reach the pyriforms allowing for sensed aspiration events during the swallow, just before hyoid excursion and epiglottic deflection. Most of pts subglottis was obscured by her shoulders despite repositioning, so the presence or absence of aspiration could not be seen in many trials. However pt had several visable episodes of aspiration with thin liquids via cup or straw and still had visible (at least) penetration with a chin tuck. Pt was able to consistently protect the airway with nectar, one sip at a time. Recommend pt continue nectar thick liquids and mechanical soft solids. Suspect that with training for an immediate protective throat clear or cough pt could reliably protect airway with thin liquids.  SLP Visit Diagnosis -- Attention and concentration deficit following -- Frontal lobe and executive function deficit following -- Impact on safety and function --   CHL IP TREATMENT RECOMMENDATION 02/11/2019 Treatment Recommendations Therapy as outlined in treatment plan below    Prognosis 02/06/2019 Prognosis for Safe Diet Advancement Good Barriers to Reach Goals -- Barriers/Prognosis Comment -- CHL IP DIET RECOMMENDATION 02/11/2019 SLP Diet Recommendations Dysphagia 3 (Mech soft) solids;Nectar thick liquid Liquid Administration via Cup;Straw Medication Administration Whole meds with puree Compensations Lingual sweep for clearance of pocketing;Small sips/bites;Slow rate Postural Changes --   CHL IP OTHER RECOMMENDATIONS 02/11/2019 Recommended Consults -- Oral Care Recommendations Oral care BID Other Recommendations --   CHL IP FOLLOW UP RECOMMENDATIONS 02/11/2019 Follow up Recommendations Inpatient Rehab;24 hour supervision/assistance   CHL  IP FREQUENCY AND DURATION 02/11/2019 Speech Therapy Frequency (ACUTE ONLY) min 2x/week Treatment Duration 2 weeks      CHL IP ORAL PHASE 02/11/2019 Oral Phase Impaired Oral - Pudding Teaspoon -- Oral - Pudding Cup -- Oral - Honey Teaspoon -- Oral - Honey Cup NT Oral - Nectar Teaspoon -- Oral - Nectar Cup -- Oral - Nectar Straw WFL Oral - Thin Teaspoon -- Oral - Thin Cup WFL Oral - Thin Straw WFL Oral - Puree WFL Oral - Mech Soft -- Oral - Regular Impaired mastication;Delayed oral transit Oral - Multi-Consistency -- Oral - Pill NT Oral Phase - Comment --  CHL IP PHARYNGEAL PHASE 02/11/2019 Pharyngeal Phase Impaired Pharyngeal- Pudding Teaspoon -- Pharyngeal -- Pharyngeal- Pudding Cup -- Pharyngeal -- Pharyngeal- Honey Teaspoon -- Pharyngeal -- Pharyngeal- Honey Cup NT Pharyngeal -- Pharyngeal- Nectar Teaspoon -- Pharyngeal -- Pharyngeal- Nectar Cup -- Pharyngeal -- Pharyngeal- Nectar Straw Reduced airway/laryngeal closure;Penetration/Aspiration during swallow Pharyngeal Material enters airway, remains ABOVE vocal cords then ejected out;Material does not enter airway Pharyngeal- Thin Teaspoon -- Pharyngeal -- Pharyngeal- Thin Cup Penetration/Aspiration before swallow;Penetration/Aspiration during swallow;Reduced airway/laryngeal closure Pharyngeal Material enters  airway, CONTACTS cords and then ejected out Pharyngeal- Thin Straw Reduced airway/laryngeal closure;Penetration/Aspiration before swallow;Penetration/Aspiration during swallow Pharyngeal Material enters airway, passes BELOW cords then ejected out Pharyngeal- Puree WFL Pharyngeal Material does not enter airway Pharyngeal- Mechanical Soft -- Pharyngeal -- Pharyngeal- Regular WFL Pharyngeal Material does not enter airway Pharyngeal- Multi-consistency -- Pharyngeal -- Pharyngeal- Pill NT Pharyngeal -- Pharyngeal Comment --  CHL IP CERVICAL ESOPHAGEAL PHASE 02/06/2019 Cervical Esophageal Phase WFL Pudding Teaspoon -- Pudding Cup -- Honey Teaspoon -- Honey Cup -- Nectar Teaspoon -- Nectar Cup -- Nectar Straw -- Thin Teaspoon -- Thin Cup -- Thin Straw -- Puree -- Mechanical Soft -- Regular -- Multi-consistency -- Pill -- Cervical Esophageal Comment -- Herbie Baltimore, MA CCC-SLP Acute Rehabilitation Services Pager [phone] Office [phone] Lynann Beaver 02/11/2019, 2:14 PM              Vas [place] Transcranial Doppler  Result Date: 02/09/2019  Transcranial Doppler Indications: Stroke. Limitations: patient immobility Limitations for diagnostic windows: Unable to insonate right transtemporal window. Unable to insonate left transtemporal window. Unable to insonate occipital window. Comparison Study: No prior studies. Performing Technologist: Oliver Hum RVT  Examination Guidelines: A complete evaluation includes B-mode imaging, spectral Doppler, color Doppler, and power Doppler as needed of all accessible portions of each vessel. Bilateral testing is considered an integral part of a complete examination. Limited examinations for reoccurring indications may be performed as noted.  +----------+-------------+----------+-----------+-------+ RIGHT TCD Right VM (cm)Depth (cm)PulsatilityComment +----------+-------------+----------+-----------+-------+ Opthalmic     17.00                  1.6             +----------+-------------+----------+-----------+-------+ ICA siphon    18.00                  1.7            +----------+-------------+----------+-----------+-------+  +----------+------------+----------+-----------+-------+ LEFT TCD  Left VM (cm)Depth (cm)PulsatilityComment +----------+------------+----------+-----------+-------+ Opthalmic    26.00                  1.8            +----------+------------+----------+-----------+-------+ ICA siphon   23.00                  1.6            +----------+------------+----------+-----------+-------+  Summary:  Absent bitemporal and occipital window greatly limitthis study.Antegrade flow in both opthalmic and carotid siphons. *See table(s) above for measurements and observations.  Diagnosing physician: Antony Contras MD Electronically signed by Antony Contras MD on 02/09/2019 at 12:41:13 PM.    Final     Labs:  Basic Metabolic Panel: Recent Labs  Lab 03/04/19 0510 03/07/19 1259  NA 141 140  K 3.8 4.3  CL 110 104  CO2 22 26  GLUCOSE 81 92  BUN 13 17  CREATININE 1.00 1.23*  CALCIUM 8.7* 9.3    CBC: Recent Labs  Lab 03/07/19 1259  WBC 6.1  NEUTROABS 3.5  HGB 13.1  HCT 42.5  MCV 96.6  PLT 210    CBG: No results for input(s): GLUCAP in the last 168 hours.  Family history.  Mother with CAD and hypertension as well as diverticulitis.  Father with hypertension and CAD.  Denies diabetes mellitus or colon cancer  Brief HPI:   KENLEIGH ASHWELL is a 85 y.o. right-handed female with history of hyperlipidemia, CVA maintained on aspirin, hypertension and gout.  She quit smoking 26 years ago.  Lives alone independent with assistive device.  1 level home with ramped entrance.  Daughter and son provide assistance as needed.  Presented 02/03/2019 to [facility] with recent fall as well as reports of some memory issues for the past 2 to 3 weeks.  Noted shortness of breath noted hypoxia oxygen saturations 84%.  Urinalysis positive nitrite, chest  x-ray mild pulmonary vascular congestion, cranial CT scan negative for acute changes, WBC 15,800, troponin negative, CK 327, lactic acid within normal limits, Covid negative, blood cultures no growth to date.  Placed on Rocephin for UTI as well as suspected pneumonia.  Neurology consulted 02/05/2019 for reports of left-sided weakness.  Cranial CT scan repeated 02/05/2019 showing patchy hypodensity in the white matter right greater than left.  No midline shift.  MRI as well as MRA of the head and neck showed acute infarct deep white matter on the right and a watershed territory likely due to severe stenosis of M1 segment.  Small acute infarct left lateral posterior temporal lobe as well as occluded left A1 segment.  Severe stenosis right M1 segment.  Patient was transferred to [facility] underwent balloon angioplasty x3 of preocclusive symptomatic right MCA M1 segment per interventional radiology.  Echocardiogram with ejection fraction of 70% without emboli.  Follow-up MRI after angioplasty showed new punctate acute early subacute infarct within the right temporal occipital lobe.  Repeat MRA 02/07/2019 persistent right M1 focal stenosis.  Patient presently on low-dose aspirin as well as Brilinta for CVA prophylaxis.  Subcutaneous heparin for DVT prophylaxis.  Dysphagia #3 nectar thick liquid diet.  Initial bouts of restlessness and agitation placed on low-dose Seroquel.  Patient was admitted for a comprehensive rehab program   Hospital Course: KRYSTELLE FOSKETT was admitted to rehab 02/11/2019 for inpatient therapies to consist of PT, ST and OT at least three hours five days a week. Past admission physiatrist, therapy team and rehab RN have worked together to provide customized collaborative inpatient rehab.  Pertaining to patient right watershed territory infarct as well as severe stenosis right M1 segment status post revascularization as well as small left lateral posterior temporal lobe infarction in the right  middle cerebellar peduncle infarct remained stable maintained on aspirin as well as Brilinta and patient would follow with neurology services.  Subcutaneous heparin for DVT prophylaxis no bleeding episodes.  Mood stabilization with Seroquel as  well as fluoxetine initiated 02/27/2019 with emotional support provided.  Her diet was slowly advanced to mechanical soft thin liquid.  Blood pressures monitored Maxide discontinued 10/14 due to AKI with latest creatinine 1.23 and continued on clonidine patch every 7 days as well as Zebeta.  Patient was advised to follow-up with PCP.  Lipitor ongoing for hyperlipidemia.  Hypothyroidism TSH mildly elevated 17.492 and Synthroid increased on 03/02/2019 250 mcg.  Patient follow with PCP for follow-up thyroid panel   Blood pressures were monitored on TID basis and monitored closely   /She is continent of bowel and bladder.  Marlana Salvage has made gains during rehab stay and is attending therapies  Marlana Salvage will continue to receive follow up therapies   after discharge  Rehab course: During patient's stay in rehab weekly team conferences were held to monitor patient's progress, set goals and discuss barriers to discharge. At admission, patient required max assist for sit to stand, max assist sit to supine and supine to sit.  Max assist for eating and feeding.   Physical exam.  Blood pressure 157/94 pulse 76 temperature 98.8 respirations 17 oxygen saturations 96% room air Constitutional.  No distress HEENT Head.  Normocephalic and atraumatic Eyes.  Pupils round and reactive to light EOMs normal no nystagmus Neck.  Supple nontender no tracheal deviation no thyromegaly without JVD Cardiovascular normal rate and rhythm no murmur heard or friction rub Respiratory effort normal no respiratory distress without wheezes GI.  Soft no distention nontender without rebound Musculoskeletal.  General.  Edema present Neurological.  Patient lying in bed occasionally coughs on remnants as  she eats.  Oriented to person and place.  Appears somewhat fatigued.  Right upper and right lower extremity grossly graded 4 - to 4 out of 5.  Left upper extremity left lower extremity 2 out of 5, mild left inattention speech dysarthric.  Senses grossly intact except decreased pain left arm and left leg  /She  has had improvement in activity tolerance, balance, postural control as well as ability to compensate for deficits. Marlana Salvage has had improvement in functional use RUE/LUE  and RLE/LLE as well as improvement in awareness.  Working with family teaching.  Family is having a ramp built to the home.  Patient's daughter set up for stand pivot transfers wheelchair edge of bed using rolling walker with moderate cueing for proper set up and transfers.  Patient stand pivot wheelchair edge of bed using rolling walker with moderate assist.  Sit to supine head of bed flat not using handrail with minimal assistance.  Supine to sit no bed rails with minimal assistance.  Perform left lateral scoot squat pivot transfers edge of bed wheelchair with max cueing for sequencing.  Patient daughter set up for stand pivot car transfers with minimal cueing for proper set up.  Patient stand pivot wheelchair simulated car using rolling walker with patient's daughter providing moderate assist.  Speech therapy follow-up patient was independently oriented to self, place and situation however minimal assist verbal cues and visual cues provided to orient to month and date using a calendar.  Full family teaching was completed the recommendations for supervision for safety and discharge to home       Disposition: Discharge disposition: 01-Home or Self Care     Discharge to home   Diet: Mechanical soft thin liquids  Special Instructions: No driving smoking or alcohol  Follow-up thyroid panel 1 month with PCP  Medications at discharge 1.  Tylenol as needed 2.  Aspirin 81 mg p.o.  daily 3.  Lipitor 40 mg p.o. daily 4.  Zebeta  2.5 mg p.o. daily 5.  Clonidine patch 0.1 mg change every 7 days 6.  Voltaren gel 4 g 4 times daily 7.  Prozac 10 mg p.o. daily 8.  Synthroid 150 mcg p.o. daily 9.  Protonix 80 mg p.o. daily 10.  Seroquel 25 mg p.o. nightly 11.  Brilinta 90 mg p.o. twice daily 8.  Synthroid 150 mcg p.o. daily 9.  Allopurinol 100 mg 2 tabs at bedtime 10.  Colchicine 0.6 mg twice daily   Discharge Instructions    Ambulatory referral to Neurology   Complete by: As directed    An appointment is requested in approximately 4 weeks right watershed territory infarction   Ambulatory referral to Physical Medicine Rehab   Complete by: As directed    Moderate complexity follow-up 1 to 2 weeks right watershed infarction      Follow-up Information    Jamse Arn, MD Follow up.   Specialty: Physical Medicine and Rehabilitation Why: Office to call for appointment Contact information: [address] [place] [address] [phone]        Lavera Guise, MD Follow up.   Specialty: Internal Medicine Contact information: [place] [address] [phone]           Signed: Cathlyn Parsons 03/09/2019, 4:46 AM Patient was seen, face-face, and physical exam performed by me on day of discharge, less than 30 minutes of total time spent.Delice Lesch, MD, ABPMR

## 2019-03-07 NOTE — Plan of Care (Signed)
  Problem: Consults Goal: RH STROKE PATIENT EDUCATION Description: See Patient Education module for education specifics  03/07/2019 1502 by Rodolph Bong, LPN Outcome: Progressing 03/07/2019 1501 by Rodolph Bong, LPN Outcome: Progressing   Problem: RH BOWEL ELIMINATION Goal: RH STG MANAGE BOWEL WITH ASSISTANCE Description: STG Manage Bowel with min Assistance. 03/07/2019 1502 by Rodolph Bong, LPN Outcome: Progressing 03/07/2019 1501 by Rodolph Bong, LPN Outcome: Progressing   Problem: RH BLADDER ELIMINATION Goal: RH STG MANAGE BLADDER WITH ASSISTANCE Description: STG Manage Bladder With min  Assistance 03/07/2019 1502 by Rodolph Bong, LPN Outcome: Progressing 03/07/2019 1501 by Rodolph Bong, LPN Outcome: Progressing   Problem: RH SAFETY Goal: RH STG ADHERE TO SAFETY PRECAUTIONS W/ASSISTANCE/DEVICE Description: STG Adhere to Safety Precautions With Cues and reminders. 03/07/2019 1502 by Rodolph Bong, LPN Outcome: Progressing 03/07/2019 1501 by Rodolph Bong, LPN Outcome: Progressing   Problem: RH KNOWLEDGE DEFICIT Goal: RH STG INCREASE KNOWLEDGE OF HYPERTENSION Description: Daughter will be able to demonstrate management of htn using handouts and reading resources for medications , bp monitoring and dietary independently. 03/07/2019 1502 by Rodolph Bong, LPN Outcome: Progressing 03/07/2019 1501 by Rodolph Bong, LPN Outcome: Progressing Goal: RH STG INCREASE KNOWLEGDE OF HYPERLIPIDEMIA Description: Daughter will be able to demonstrate management of hld using handouts and reading resources for medications and dietary independently. 03/07/2019 1502 by Rodolph Bong, LPN Outcome: Progressing 03/07/2019 1501 by Rodolph Bong, LPN Outcome: Progressing Goal: RH STG INCREASE KNOWLEDGE OF STROKE PROPHYLAXIS Description: Daughter will be able to demonstrate management of stroke using handouts and reading resources for medications , bp monitoring and dietary  restrictions independently. 03/07/2019 1502 by Rodolph Bong, LPN Outcome: Progressing 03/07/2019 1501 by Rodolph Bong, LPN Outcome: Progressing

## 2019-03-07 NOTE — Progress Notes (Signed)
Occupational Therapy Session Note  Patient Details  Name: Christy Carter MRN: [id number] Date of Birth: 02/19/1934  Today's Date: 03/07/2019 OT Individual Time: 0768-0881 OT Individual Time Calculation (min): 68 min    Short Term Goals: Week 3:  OT Short Term Goal 1 (Week 3): Pt will be able to sit to stand from w/c with min A to prepare for ADL transfers. OT Short Term Goal 1 - Progress (Week 3): Met OT Short Term Goal 2 (Week 3): Pt will be able to maintain dynamic stand with min A as she pulls pants over her hips. OT Short Term Goal 2 - Progress (Week 3): Met OT Short Term Goal 3 (Week 3): Pt will be able to toilet with min A. OT Short Term Goal 3 - Progress (Week 3): Met OT Short Term Goal 4 (Week 3): Pt will demonstrate improved L side awareness and praxis to stand pivot transfer with min A to her L with only min cues. OT Short Term Goal 4 - Progress (Week 3): Met Week 4:  OT Short Term Goal 1 (Week 4): STGs = LTGs with a focus on family education  Skilled Therapeutic Interventions/Progress Updates:    pt seen this session for ADL training with a focus on functional mobility and endurance.  Pt's dtr not here this session.  Pt received in w/c ready for a shower.  While therapist prepared items needed for shower, pt completed oral care, grooming at the sink.  She was then taken into bathroom via w/c.  Using the grab bars she completed a stand pivot to her R with min A to sit on tub bench. Bathed self actively using her L hand.  She stood with bars and was able to wash bottom with therapist providing CGA for balance.  She then completed stand pivot with bars back to w/c.   From w/c she worked on dressing.  Min A with bra to fasten and pt donned shirt independently.   Pt able to don pants over feet without A.   Sit to stand from w/c to to RW fluctuated from min to mod A based on how much forward wt shift she was able to do.  She continues to have difficulty keeping the L foot planted as it  pulls into adduction when she weight shifts to her R.  She could then pull pants over hips with min A.   Pt did use L hand to A with pulling on TED hose and placed feet in shoes but still needs mod A overall with shoes.   Overall, pt participated well and was very energetic, excited about going home soon.   Pt resting in w/c with belt alarm on and all needs met.  Pt met her LTGs of bathing and dressing.   Therapy Documentation Precautions:  Precautions Precautions: Fall, Other (comment) Precaution Comments: L inattention, left hemiparesis Restrictions Weight Bearing Restrictions: No    Vital Signs: Therapy Vitals BP: (!) 125/48 Pain: Pain Assessment Pain Scale: 0-10 Pain Score: 0-No pain ADL: ADL Eating: Supervision/safety Grooming: Supervision/safety Where Assessed-Grooming: Sitting at sink Upper Body Bathing: Supervision/safety Where Assessed-Upper Body Bathing: Shower Lower Body Bathing: Contact guard Where Assessed-Lower Body Bathing: Shower Upper Body Dressing: Minimal assistance Where Assessed-Upper Body Dressing: Wheelchair Lower Body Dressing: Moderate assistance Where Assessed-Lower Body Dressing: Wheelchair Toileting: Moderate assistance Where Assessed-Toileting: Glass blower/designer: Psychiatric nurse Method: Arts development officer: Grab bars, Raised toilet seat Social research officer, government: Environmental education officer Method:  Stand pivot Youth worker: Radio broadcast assistant, Grab bars   Therapy/Group: Individual Therapy  [place] 03/07/2019, 12:16 PM

## 2019-03-07 NOTE — Progress Notes (Signed)
Speech Language Pathology Daily Session Note  Patient Details  Name: Christy Carter MRN: [id number] Date of Birth: March 03, 1934  Today's Date: 03/07/2019 SLP Individual Time: 1000-1057 SLP Individual Time Calculation (min): 57 min  Short Term Goals: Week 4: SLP Short Term Goal 1 (Week 4): STG=LTG due to remaining LOS  Skilled Therapeutic Interventions: Pt was seen for skilled ST targeting cognitive goals. Pt was independently oriented to self, place, and situation, however Min A verbal and visual cues provided to orient to month and date using an October calendar. SLP further facilitated session with fluctuating Min-Mod A verbal and visual cues for basic problem solving and error awareness during a basic money management activity (ALFA). Pt's performance when counting and/or calculating change was more accurate when she verbally problem solved prior to manipulating the change. Pt was left sitting in wheelchair with seatbelt alarm engaged and all needs within reach. Continue per current plan of care.    Pain Pain Assessment Pain Scale: 0-10 Pain Score: 0-No pain  Therapy/Group: Individual Therapy  [name] 03/07/2019, 12:11 PM

## 2019-03-07 NOTE — Progress Notes (Signed)
Social Work Patient ID: Christy Carter, female   DOB: 1934/03/06, 85 y.o.   MRN: [id number]    Diagnosis codes:i63.89 & G93.40  Height:  5'6            Weight:   244 lbs         Patient suffers from [employer]   which impairs her ability to perform daily activities like ADL's and tolieting   in the home.  A walker  will not resolve issue with performing activities of daily living.  A wheelchair will allow patient to safely perform daily activities. Patient can self propel in the lightweight wheelchair. She does have the upper body strength to propel herself in a wheelchair.

## 2019-03-07 NOTE — Progress Notes (Signed)
[facility] PHYSICAL MEDICINE & REHABILITATION PROGRESS NOTE  Subjective/Complaints: Patient seen sitting up in a chair this morning.  She states she slept well overnight.  She is aware and looking forward to being discharged home.  She notes improvement in cognition.  ROS: Denies CP, SOB, N/V/D  Objective:   No results found. No results for input(s): WBC, HGB, HCT, PLT in the last 72 hours. No results for input(s): NA, K, CL, CO2, GLUCOSE, BUN, CREATININE, CALCIUM in the last 72 hours.  Intake/Output Summary (Last 24 hours) at 03/07/2019 1032 Last data filed at 03/07/2019 0900 Gross per 24 hour  Intake 510 ml  Output -  Net 510 ml     Physical Exam: Vital Signs Blood pressure (!) 127/40, pulse (!) 54, temperature 97.9 F (36.6 C), temperature source Oral, resp. rate 16, height 5\' 7"  (1.702 m), weight 104.8 kg, SpO2 98 %. Constitutional: No distress . Vital signs reviewed. HENT: Normocephalic.  Atraumatic. Eyes: EOMI. No discharge. Cardiovascular: No JVD. Respiratory: Normal effort.  No stridor. GI: Non-distended. Skin: Warm and dry.  Intact. Psych: Normal mood.  Normal behavior. Musc: Lower extremity edema Neurological:Alert and oriented, except for date of month  Motor: LUE/LLE: 4+/5 proximal to distal, stable RUE/RLE: 5/5 proximal distal, stable  Assessment/Plan: 1. Functional deficits secondary to Left hemiparesis with dysphagia/dysarthriasecondary to right watershed territory infarction in the setting of severe stenosis right M1 segment status post revascularization which require 3+ hours per day of interdisciplinary therapy in a comprehensive inpatient rehab setting.  Physiatrist is providing close team supervision and 24 hour management of active medical problems listed below.  Physiatrist and rehab team continue to assess barriers to discharge/monitor patient progress toward functional and medical goals  Care Tool:  Bathing  Bathing activity did not occur:  Safety/medical concerns Body parts bathed by patient: Right arm, Left arm, Chest, Abdomen, Front perineal area, Buttocks, Right upper leg, Left upper leg, Right lower leg, Left lower leg, Face   Body parts bathed by helper: Right lower leg, Left lower leg     Bathing assist Assist Level: Contact Guard/Touching assist     Upper Body Dressing/Undressing Upper body dressing   What is the patient wearing?: Bra, Pull over shirt    Upper body assist Assist Level: Minimal Assistance - Patient > 75%    Lower Body Dressing/Undressing Lower body dressing      What is the patient wearing?: Pants, Incontinence brief     Lower body assist Assist for lower body dressing: Minimal Assistance - Patient > 75%     Toileting Toileting    Toileting assist Assist for toileting: Total Assistance - Patient < 25%     Transfers Chair/bed transfer  Transfers assist     Chair/bed transfer assist level: Moderate Assistance - Patient 50 - 74%     Locomotion Ambulation   Ambulation assist   Ambulation activity did not occur: Safety/medical concerns  Assist level: 2 helpers Assistive device: Walker-rolling Max distance: 5ft   Walk 10 feet activity   Assist  Walk 10 feet activity did not occur: Safety/medical concerns  Assist level: 2 helpers Assistive device: Walker-rolling   Walk 50 feet activity   Assist Walk 50 feet with 2 turns activity did not occur: Safety/medical concerns  Assist level: 2 helpers Assistive device: Walker-rolling    Walk 150 feet activity   Assist Walk 150 feet activity did not occur: Safety/medical concerns         Walk 10 feet on uneven surface  activity   Assist Walk 10 feet on uneven surfaces activity did not occur: Safety/medical concerns         Wheelchair     Assist Will patient use wheelchair at discharge?: Yes Type of Wheelchair: Manual Wheelchair activity did not occur: Safety/medical concerns  Wheelchair assist  level: Moderate Assistance - Patient 50 - 74% Max wheelchair distance: 68'    Wheelchair 50 feet with 2 turns activity    Assist    Wheelchair 50 feet with 2 turns activity did not occur: Safety/medical concerns       Wheelchair 150 feet activity     Assist  Wheelchair 150 feet activity did not occur: Safety/medical concerns       Blood pressure (!) 127/40, pulse (!) 54, temperature 97.9 F (36.6 C), temperature source Oral, resp. rate 16, height 5\' 7"  (1.702 m), weight 104.8 kg, SpO2 98 %.  Medical Problem List and Plan: 1.Left hemiplegia with dysphagia/dysarthriasecondary to right watershed territory infarction in the setting of severe stenosis right M1 segment status post revascularization as well as small left lateral posterior temporal lobe infarct in right middle cerebellar peduncle infarct  Continue CIR  2. Antithrombotics: -DVT/anticoagulation:Subcutaneous heparin -antiplatelet therapy: Aspirin 81 mg daily, Brilinta 90 mg twice daily 3. Pain Management:Tylenol as needed  Added voltaren gel for R foot 4. Mood:Seroquel 12.5 mg nightly  -Fluoxetine started on 10/18  -team providing ego support also -antipsychotic agents: N/A 5. Neuropsych: She is not fully capable of making decisions onherown behalf. 6. Skin/Wound Care:Routine skin checks 7. Fluids/Electrolytes/Nutrition:Routine in and outs 8. Post strokedysphagia.   D2 nectars, advanced to D3 thins  Encourage fluids  Advance diet as tolerated  9. Hypertension.   Maxide 37.5-25 mg daily DC'd on 10/14 due to AKI  Continue clonidine patch 0.1 mg change every 7 days, Zebeta 2.5 mg daily.   Vitals:   03/07/19 0533 03/07/19 0539  BP: (!) 141/39 (!) 127/40  Pulse: (!) 54   Resp: 16   Temp: 97.9 F (36.6 C)   SpO2: 98%    Labile on 10/26 10. Hyperlipidemia. Lipitor 11. Hypothyroidism. Synthroid  TSH very elevated, Synthroid increased on 10/21 12. R  foot pain- Mid foot OA:?  Resolved 13.  Leukocytosis: Resolved  WBC 7.1 on 10/17  Afebrile  Continue to monitor 14.  AKI with acute lower UTI  Creatinine 1.0 on 10/23, labs pending  Echo reviewed-EF 65-70% with hyperdynamic left ventricle  IVF changed to nightly on 10/15, DC'd on 10/23  Ucx Enterococcus Faecalis-started on ampicillin on 10/16-10/24 15.  Hyperglycemia  Improving on 10/20 16.  Metabolic encephalopathy  Likely multifactorial-AKA +/- hypothyroidism +/- with pseudodementia  See # 4, 14  Improving 17. Sleep disturbance  Low dose Seroquel started on 10/21, increased to 25mg    LOS: 24 days A FACE TO FACE EVALUATION WAS PERFORMED   [name] 03/07/2019, 10:32 AM

## 2019-03-07 NOTE — Progress Notes (Signed)
Physical Therapy Session Note  Patient Details  Name: Christy Carter MRN: [id number] Date of Birth: 02-01-1934  Today's Date: 03/07/2019 PT Individual Time: 1433-1530 PT Individual Time Calculation (min): 57 min   Short Term Goals: Week 4:  PT Short Term Goal 1 (Week 4): = to LTGs based on ELOS  Skilled Therapeutic Interventions/Progress Updates:    Pt seated in w/c upon PT arrival, agreeable to therapy tx and denies pain. Pt's daughter present throughout this session for family education. Pt's daughter reports she can not come tomorrow for family education, she has to prepare the house. Pt transported to the rehab apartment. Pt performed stand pivot transfer this session from w/c<>shower bench with assist from her daughter, therapist providing education and cues for techniques throughout. Pt's daughter reports that the w/c should fit in the master bathroom (which they are planning to use). Pt transported to therapy gym. Pt ambulated x 15 ft with assist from therapist and ambulated x 15 ft with assist from her daughter using RW and min assist, increased time to complete with max cues for sequencing, awareness and foot placement during turns. Therapist provided continued education on precautions to take at home, guarding techniques, appropriate cues and set up of transfers. Therapist discussed having a second person available for car transfer. Pt transported back to room and pt's daughter assisted pt to bed, stand pivot with RW. Pt transferred to supine with mod assist, therapist educating family on techniques for this. Pt left supine at end of session with needs in reach and bed alarm set.   Therapy Documentation Precautions:  Precautions Precautions: Fall, Other (comment) Precaution Comments: L inattention, left hemiparesis Restrictions Weight Bearing Restrictions: No    Therapy/Group: Individual Therapy  Netta Corrigan , PT, DPT, CSRS 03/07/2019, 7:58 AM

## 2019-03-08 ENCOUNTER — Encounter (HOSPITAL_COMMUNITY): Payer: Medicare Other | Admitting: Occupational Therapy

## 2019-03-08 ENCOUNTER — Inpatient Hospital Stay (HOSPITAL_COMMUNITY): Payer: Medicare Other | Admitting: Speech Pathology

## 2019-03-08 ENCOUNTER — Inpatient Hospital Stay (HOSPITAL_COMMUNITY): Payer: Medicare Other

## 2019-03-08 MED ORDER — FLUOXETINE HCL 10 MG PO CAPS: 10.0000 mg | ORAL_CAPSULE | Freq: Every day | ORAL | 3 refills | Status: AC

## 2019-03-08 MED ORDER — CLONIDINE 0.1 MG/24HR TD PTWK: 0.1000 mg | MEDICATED_PATCH | TRANSDERMAL | 12 refills | Status: AC

## 2019-03-08 MED ORDER — OMEPRAZOLE 40 MG PO CPDR: 40.0000 mg | DELAYED_RELEASE_CAPSULE | Freq: Every day | ORAL | 2 refills | Status: AC

## 2019-03-08 MED ORDER — QUETIAPINE FUMARATE 25 MG PO TABS: 25.0000 mg | ORAL_TABLET | Freq: Every day | ORAL | 0 refills | Status: AC

## 2019-03-08 MED ORDER — LEVOTHYROXINE SODIUM 150 MCG PO TABS: 150.0000 ug | ORAL_TABLET | Freq: Every day | ORAL | 0 refills | Status: AC

## 2019-03-08 MED ORDER — ATORVASTATIN CALCIUM 40 MG PO TABS: 40.0000 mg | ORAL_TABLET | Freq: Every day | ORAL | 0 refills | Status: AC

## 2019-03-08 MED ORDER — DICLOFENAC SODIUM 1 % TD GEL: 4.0000 g | Freq: Four times a day (QID) | TRANSDERMAL | 0 refills | Status: AC

## 2019-03-08 MED ORDER — ACETAMINOPHEN 325 MG PO TABS: 650.0000 mg | ORAL_TABLET | ORAL | Status: AC | PRN

## 2019-03-08 MED ORDER — BISOPROLOL FUMARATE 5 MG PO TABS: 2.5000 mg | ORAL_TABLET | Freq: Every day | ORAL | 0 refills | Status: AC

## 2019-03-08 MED ORDER — TICAGRELOR 90 MG PO TABS: 90.0000 mg | ORAL_TABLET | Freq: Two times a day (BID) | ORAL | 1 refills | Status: AC

## 2019-03-08 NOTE — Progress Notes (Signed)
[facility] PHYSICAL MEDICINE & REHABILITATION PROGRESS NOTE  Subjective/Complaints: Patient seen laying in bed this morning.  She states she slept well overnight.  She is slightly more confused than yesterday, however she is just waking up.  ROS: Denies CP, SOB, N/V/D  Objective:   No results found. Recent Labs    03/07/19 1259  WBC 6.1  HGB 13.1  HCT 42.5  PLT 210   Recent Labs    03/07/19 1259  NA 140  K 4.3  CL 104  CO2 26  GLUCOSE 92  BUN 17  CREATININE 1.23*  CALCIUM 9.3    Intake/Output Summary (Last 24 hours) at 03/08/2019 1355 Last data filed at 03/08/2019 1258 Gross per 24 hour  Intake 480 ml  Output -  Net 480 ml     Physical Exam: Vital Signs Blood pressure (!) 117/36, pulse (!) 58, temperature 97.6 F (36.4 C), temperature source Oral, resp. rate 18, height 5\' 7"  (1.702 m), weight 104.8 kg, SpO2 91 %. Constitutional: No distress . Vital signs reviewed. HENT: Normocephalic.  Atraumatic. Eyes: EOMI. No discharge. Cardiovascular: No JVD. Respiratory: Normal effort.  No stridor. GI: Non-distended. Skin: Warm and dry.  Intact. Psych: Normal mood.  Normal behavior. Musc: Lower extremity edema  Neurological:Alert and oriented, except for date of month Motor: LUE/LLE: 4+/5 proximal to distal, unchanged RUE/RLE: 5/5 proximal distal, unchanged  Assessment/Plan: 1. Functional deficits secondary to Left hemiparesis with dysphagia/dysarthriasecondary to right watershed territory infarction in the setting of severe stenosis right M1 segment status post revascularization which require 3+ hours per day of interdisciplinary therapy in a comprehensive inpatient rehab setting.  Physiatrist is providing close team supervision and 24 hour management of active medical problems listed below.  Physiatrist and rehab team continue to assess barriers to discharge/monitor patient progress toward functional and medical goals  Care Tool:  Bathing  Bathing activity  did not occur: Safety/medical concerns Body parts bathed by patient: Right arm, Left arm, Chest, Abdomen, Front perineal area, Buttocks, Right upper leg, Left upper leg, Right lower leg, Left lower leg, Face   Body parts bathed by helper: Right lower leg, Left lower leg     Bathing assist Assist Level: Contact Guard/Touching assist     Upper Body Dressing/Undressing Upper body dressing   What is the patient wearing?: Bra, Pull over shirt    Upper body assist Assist Level: Contact Guard/Touching assist    Lower Body Dressing/Undressing Lower body dressing      What is the patient wearing?: Pants, Incontinence brief     Lower body assist Assist for lower body dressing: Minimal Assistance - Patient > 75%     Toileting Toileting    Toileting assist Assist for toileting: Total Assistance - Patient < 25%     Transfers Chair/bed transfer  Transfers assist     Chair/bed transfer assist level: Moderate Assistance - Patient 50 - 74%     Locomotion Ambulation   Ambulation assist   Ambulation activity did not occur: Safety/medical concerns  Assist level: Moderate Assistance - Patient 50 - 74% Assistive device: Walker-rolling Max distance: 15 ft   Walk 10 feet activity   Assist  Walk 10 feet activity did not occur: Safety/medical concerns  Assist level: Moderate Assistance - Patient - 50 - 74% Assistive device: Walker-rolling   Walk 50 feet activity   Assist Walk 50 feet with 2 turns activity did not occur: Safety/medical concerns  Assist level: 2 helpers Assistive device: Walker-rolling    Walk 150 feet  activity   Assist Walk 150 feet activity did not occur: Safety/medical concerns         Walk 10 feet on uneven surface  activity   Assist Walk 10 feet on uneven surfaces activity did not occur: Safety/medical concerns         Wheelchair     Assist Will patient use wheelchair at discharge?: Yes Type of Wheelchair: Manual Wheelchair  activity did not occur: Safety/medical concerns  Wheelchair assist level: Moderate Assistance - Patient 50 - 74% Max wheelchair distance: 22'    Wheelchair 50 feet with 2 turns activity    Assist    Wheelchair 50 feet with 2 turns activity did not occur: Safety/medical concerns       Wheelchair 150 feet activity     Assist  Wheelchair 150 feet activity did not occur: Safety/medical concerns       Blood pressure (!) 117/36, pulse (!) 58, temperature 97.6 F (36.4 C), temperature source Oral, resp. rate 18, height 5\' 7"  (1.702 m), weight 104.8 kg, SpO2 91 %.  Medical Problem List and Plan: 1.Left hemiplegia with dysphagia/dysarthriasecondary to right watershed territory infarction in the setting of severe stenosis right M1 segment status post revascularization as well as small left lateral posterior temporal lobe infarct in right middle cerebellar peduncle infarct  Continue CIR  2. Antithrombotics: -DVT/anticoagulation:Subcutaneous heparin -antiplatelet therapy: Aspirin 81 mg daily, Brilinta 90 mg twice daily 3. Pain Management:Tylenol as needed  Added voltaren gel for R foot 4. Mood:Seroquel 12.5 mg nightly  -Fluoxetine started on 10/18  -team providing ego support also -antipsychotic agents: N/A 5. Neuropsych: She is not fully capable of making decisions onherown behalf. 6. Skin/Wound Care:Routine skin checks 7. Fluids/Electrolytes/Nutrition:Routine in and outs 8. Post strokedysphagia.   D2 nectars, advanced to D3 thins  Encourage fluids  Advance diet as tolerated  9. Hypertension.   Maxide 37.5-25 mg daily DC'd on 10/14 due to AKI  Continue clonidine patch 0.1 mg change every 7 days, Zebeta 2.5 mg daily.   Vitals:   03/07/19 2009 03/08/19 0418  BP: (!) 145/43 (!) 117/36  Pulse: (!) 59 (!) 58  Resp: 17 18  Temp: (!) 97.5 F (36.4 C) 97.6 F (36.4 C)  SpO2: 96% 91%   Labile on 10/27 10. Hyperlipidemia.  Lipitor 11. Hypothyroidism. Synthroid  TSH very elevated, Synthroid increased on 10/21 12. R foot pain- Mid foot OA:?  Resolved 13.  Leukocytosis: Resolved  WBC 7.1 on 10/17  Afebrile  Continue to monitor 14.  AKI with acute lower UTI  Creatinine 1.23 on 10/26  Encourage fluids  Echo reviewed-EF 65-70% with hyperdynamic left ventricle  IVF changed to nightly on 10/15, DC'd on 10/23  Ucx Enterococcus Faecalis-started on ampicillin on 10/16-10/24 15.  Hyperglycemia: Resolved 16.  Metabolic encephalopathy  Likely multifactorial-AKA +/- hypothyroidism +/- with pseudodementia  See # 4, 14  Improving 17. Sleep disturbance  Low dose Seroquel started on 10/21, increased to 25mg , continue to monitor-may need to decrease if daytime drowsiness  LOS: 25 days A FACE TO FACE EVALUATION WAS PERFORMED  Anjel Perfetti [name] 03/08/2019, 1:55 PM

## 2019-03-08 NOTE — Progress Notes (Addendum)
Speech Language Pathology Discharge Summary  Patient Details  Name: Christy Carter MRN: [id number] Date of Birth: 02-20-1934  Today's Date: 03/08/2019 SLP Individual Time: 0829-0930 SLP Individual Time Calculation (min): 61 min   Skilled Therapeutic Interventions:  Pt was seen for skilled ST targeting dysphagia and cognitive goals. SLP facilitated session with Supervision A verbal reminder to utilize swallow strategies during skilled observation of pt consuming current diet (dys 3, thin liquid) breakfast. No overt s/s aspiration observed throughout intake; efficient mastication and oral clearance noted. Recommend continue current diet. SLP also provided handout and verbal review of compensatory memory strategies to aid in pt's transition back home and short term memory - handout left in pt's healthcare notebook with note to daughter. (Although daughter not present for ST today, she has been present for 3 previous ST session, and education has been gradual/ongoing). Pt requested to use restroom during session; Mod A verbal cues provided for sequencing and problem solving during transfers from bed to stedy, stedy to toilet, stedy to wheelchair. Pt continent of urine. SLP further facilitated session with familiar basic card game, which pt recalled general rules for with Mod A verbal and visual cues. Pt identified higher value cards (out of 2) with overall Min A for problem solving, Mod A for sequencing. During functional conversation, pt also required Mod A verbal cues to identify ADLs she will need assistance with upon her return home. Pt left sitting in wheelchair with seatbelt alarm set and all needs met. Continue per current plan of care.     Patient has met 6 of 6 long term goals.  Patient to discharge at overall Mod;Supervision(Supervision A - swallow strategies; Mod A - basic cognition) level.  Reasons goals not met: n/a   Clinical Impression/Discharge Summary:   Pt made slow but functional gains  and met 6 out of 6 long term goals this admission. However due to fluctuating lethargy and confusion, pt's cognitive goals were downgraded from [place] A to Mod A during her stay. Pt currently requires Mod assist for basic functional cognitive tasks and will require 24/7 supervision. Pt is consuming dysphagia 2 (mech soft) diet with thin liquids and Supervision A for use of swallow strategies (particularly slow rate and clearance left buccal residue/pocketing). Pt has demonstrated improved oropharyngeal swallow function, evidenced by both bedside and instrumental MBSS assessments throughout her stay. She also demonstrated improved basic problem solving and orientation with cues to use external aids. However, given deficits in basic problem solving, sequencing, decreased safety and emergent awareness still present, recommend pt continue to receive skilled [facility] services upon discharge to maximize cognitive functioning and reduce burden of care. Pt and family education was ongoing throughout her stay and is complete at this time.   Care Partner:  Caregiver Able to Provide Assistance: Yes  Type of Caregiver Assistance: Cognitive  Recommendation:  Home Health SLP;24 hour supervision/assistance  Rationale for SLP Follow Up: Maximize cognitive function and independence;Reduce caregiver burden   Equipment: none   Reasons for discharge: Discharged from hospital   Patient/Family Agrees with Progress Made and Goals Achieved: Yes    [name] 03/08/2019, 9:42 AM

## 2019-03-08 NOTE — Progress Notes (Signed)
Social Work Discharge Note   The overall goal for the admission was met for:   Discharge location: Yes-HOME WITH DAUGHTER WHO CAN PROVIDE 24 HR CARE  Length of Stay: Yes-26 DAYS  Discharge activity level: Yes-MIN LEVEL OF ASSIST  Home/community participation: Yes  Services provided included: MD, RD, PT, OT, SLP, RN, CM, Pharmacy, Neuropsych and SW  Financial Services: Private Insurance: [facility]  Follow-up services arranged: Home Health: [facility]-PT,OT,SP, DME: [facility] and Patient/Family has no preference for HH/DME agencies  Comments (or additional information):GOING HOME WITH DAUGHTER WHILE HER HOME IS BEING RENOVATED. DAUGHTER AWARE OF HER NEED FOR 24 HR PHYSICAL CARE AND WAS HERE FOR TRAINING. NEEDS TO GET HER MEDICAID RE-APPROVED. PLANS ON DOING THIS  Patient/Family verbalized understanding of follow-up arrangements: Yes  Individual responsible for coordination of the follow-up plan: [name]-DAUGHTER  Confirmed correct DME delivered: Elease Hashimoto 03/08/2019    Elease Hashimoto

## 2019-03-08 NOTE — Plan of Care (Signed)
  Problem: Consults Goal: RH STROKE PATIENT EDUCATION Description: See Patient Education module for education specifics  Outcome: Progressing   Problem: RH BOWEL ELIMINATION Goal: RH STG MANAGE BOWEL WITH ASSISTANCE Description: STG Manage Bowel with min Assistance. Outcome: Progressing   Problem: RH BLADDER ELIMINATION Goal: RH STG MANAGE BLADDER WITH ASSISTANCE Description: STG Manage Bladder With min  Assistance Outcome: Progressing   Problem: RH SAFETY Goal: RH STG ADHERE TO SAFETY PRECAUTIONS W/ASSISTANCE/DEVICE Description: STG Adhere to Safety Precautions With Cues and reminders. Outcome: Progressing   Problem: RH KNOWLEDGE DEFICIT Goal: RH STG INCREASE KNOWLEDGE OF HYPERTENSION Description: Daughter will be able to demonstrate management of htn using handouts and reading resources for medications , bp monitoring and dietary independently. Outcome: Progressing Goal: RH STG INCREASE KNOWLEGDE OF HYPERLIPIDEMIA Description: Daughter will be able to demonstrate management of hld using handouts and reading resources for medications and dietary independently. Outcome: Progressing Goal: RH STG INCREASE KNOWLEDGE OF STROKE PROPHYLAXIS Description: Daughter will be able to demonstrate management of stroke using handouts and reading resources for medications , bp monitoring and dietary restrictions independently. Outcome: Progressing

## 2019-03-08 NOTE — Progress Notes (Signed)
Occupational Therapy Discharge Summary  Patient Details  Name: Christy Carter MRN: [id number] Date of Birth: Jan 04, 1934     Patient has met 9 of 9 long term goals due to improved activity tolerance, improved balance, postural control, ability to compensate for deficits, functional use of  LEFT upper extremity, improved attention, improved awareness and improved coordination.  Patient to discharge at [facility] Assist level.  Patient's care partner is independent to provide the necessary physical and cognitive assistance at discharge.    Reasons goals not met: n/a  Recommendation:  Patient will benefit from ongoing skilled OT services in home health setting to continue to advance functional skills in the area of BADL.  Equipment: BSC  Reasons for discharge: treatment goals met  Patient/family agrees with progress made and goals achieved: Yes  OT Discharge Precautions/Restrictions  Precautions Precaution Comments: L inattention, left hemiparesis ADL ADL Eating: Supervision/safety Grooming: Supervision/safety Where Assessed-Grooming: Sitting at sink Upper Body Bathing: Supervision/safety Where Assessed-Upper Body Bathing: Shower Lower Body Bathing: Contact guard Where Assessed-Lower Body Bathing: Shower Upper Body Dressing: Minimal assistance Where Assessed-Upper Body Dressing: Wheelchair Lower Body Dressing: Moderate assistance Where Assessed-Lower Body Dressing: Wheelchair Toileting: Moderate assistance Where Assessed-Toileting: Glass blower/designer: Psychiatric nurse Method: Arts development officer: Grab bars, Raised toilet seat Social research officer, government: Minimal assistance Social research officer, government Method: Radiographer, therapeutic: Radio broadcast assistant, Grab bars Vision Baseline Vision/History: Wears glasses Wears Glasses: At all times Patient Visual Report: No change from baseline Ocular Range of Motion: Within Functional  Limits Alignment/Gaze Preference: Within Defined Limits Tracking/Visual Pursuits: Decreased smoothness of horizontal tracking;Left eye does not track laterally;Decreased smoothness of eye movement to LEFT superior field;Decreased smoothness of eye movement to LEFT inferior field Saccades: Decreased speed of saccadic movement Convergence: Within functional limits Visual Fields: Left visual field deficit Depth Perception: (functional) Perception  Inattention/Neglect: Does not attend to left visual field(minimally impaired vs max impaired on admission) Praxis Praxis: Intact Cognition Overall Cognitive Status: Impaired/Different from baseline Arousal/Alertness: Awake/alert Orientation Level: Oriented to person;Oriented to situation;Oriented to place;Disoriented to time(oriented to month but not date) Attention: Selective Focused Attention: Appears intact Sustained Attention: Appears intact Selective Attention: Appears intact Memory: Impaired Memory Impairment: Decreased recall of new information;Decreased short term memory Decreased Short Term Memory: Verbal basic;Functional basic Awareness: Impaired Awareness Impairment: Emergent impairment Problem Solving: Impaired Problem Solving Impairment: Functional basic Executive Function: Sequencing Sequencing: Impaired Sequencing Impairment: Verbal basic;Functional basic Safety/Judgment: Impaired Sensation Sensation Light Touch Impaired Details: Impaired LLE Hot/Cold: Appears Intact Proprioception: Impaired by gross assessment(LLE) Stereognosis: Appears Intact Coordination Fine Motor Movements are Fluid and Coordinated: No(minimally impaired vs max imp on admission) Finger Nose Finger Test: slow but accurate Motor  Motor Motor - Discharge Observations: LLE weakness and incoordination; excellent progress with LUE functional use and strength Mobility    min A overall, mod A when pt is extremely fatigued Trunk/Postural Assessment   Postural Control Righting Reactions: delayed Protective Responses: inadequate  Balance Static Sitting Balance Static Sitting - Level of Assistance: 7: Independent Dynamic Sitting Balance Dynamic Sitting - Level of Assistance: 5: Stand by assistance Static Standing Balance Static Standing - Level of Assistance: 4: Min assist Extremity/Trunk Assessment RUE Assessment RUE Assessment: Within Functional Limits General Strength Comments: 4+/5 LUE Assessment Passive Range of Motion (PROM) Comments: WFL Active Range of Motion (AROM) Comments: WFL General Strength Comments: 4/5 throughout UE Brunstrum level for arm: Stage V Relative Independence from Synergy Brunstrum level for hand: Stage VI Isolated joint movements  LUE AROM (degrees) Left Shoulder Flexion: 140 Degrees Left Shoulder ABduction: 140 Degrees   , 03/08/2019, 12:26 PM

## 2019-03-08 NOTE — Discharge Instructions (Signed)
Inpatient Rehab Discharge Instructions  Christy Carter Discharge date and time: No discharge date for patient encounter.   Activities/Precautions/ Functional Status: Activity: activity as tolerated Diet: soft Wound Care: none needed Functional status:  ___ No restrictions     ___ Walk up steps independently ___ 24/7 supervision/assistance   ___ Walk up steps with assistance ___ Intermittent supervision/assistance  ___ Bathe/dress independently ___ Walk with walker     _x__ Bathe/dress with assistance ___ Walk Independently    ___ Shower independently ___ Walk with assistance    ___ Shower with assistance ___ No alcohol     ___ Return to work/school ________  Special Instructions: No driving smoking or alcohol  Follow-up thyroid panel 1 month with PCP  COMMUNITY REFERRALS UPON DISCHARGE: Home Health:   PT, OT, [place] Phone: [phone]  Date of last service:03/08/2019  Medical Equipment/Items Ordered:WHEELCHAIR, [place] 3 IN 1  Agency/Supplies:[facility] [place] [facility] [phone] ( 3 IN 1 )    STROKE/TIA DISCHARGE INSTRUCTIONS SMOKING Cigarette smoking nearly doubles your risk of having a stroke & is the single most alterable risk factor  If you smoke or have smoked in the last 12 months, you are advised to quit smoking for your health.  Most of the excess cardiovascular risk related to smoking disappears within a year of stopping.  Ask you doctor about anti-smoking medications   Quit Line: 1-800-QUIT NOW  Free Smoking Cessation Classes [phone]  CHOLESTEROL Know your levels; limit fat & cholesterol in your diet  Lipid Panel     Component Value Date/Time   CHOL 213 (H) 02/06/2019 0252   CHOL 98 09/07/2013 0358   TRIG 249 (H) 02/07/2019 0507   TRIG 63 09/07/2013 0358   HDL 44 02/06/2019 0252   HDL 43 09/07/2013 0358   CHOLHDL 4.8 02/06/2019 0252   VLDL 30 02/06/2019 0252   VLDL 13 09/07/2013 0358   LDLCALC  139 (H) 02/06/2019 0252   LDLCALC 42 09/07/2013 0358      Many patients benefit from treatment even if their cholesterol is at goal.  Goal: Total Cholesterol (CHOL) less than 160  Goal:  Triglycerides (TRIG) less than 150  Goal:  HDL greater than 40  Goal:  LDL (LDLCALC) less than 100   BLOOD PRESSURE American Stroke Association blood pressure target is less that 120/80 mm/Hg  Your discharge blood pressure is:  BP: (!) 139/43  Monitor your blood pressure  Limit your salt and alcohol intake  Many individuals will require more than one medication for high blood pressure  DIABETES (A1c is a blood sugar average for last 3 months) Goal HGBA1c is under 7% (HBGA1c is blood sugar average for last 3 months)  Diabetes: No known diagnosis of diabetes    Lab Results  Component Value Date   HGBA1C 5.6 02/06/2019     Your HGBA1c can be lowered with medications, healthy diet, and exercise.  Check your blood sugar as directed by your physician  Call your physician if you experience unexplained or low blood sugars.  PHYSICAL ACTIVITY/REHABILITATION Goal is 30 minutes at least 4 days per week  Activity: Increase activity slowly, Therapies: Physical Therapy: Home Health Return to work:   Activity decreases your risk of heart attack and stroke and makes your heart stronger.  It helps control your weight and blood pressure; helps you relax and can improve your mood.  Participate in a regular exercise program.  Talk with your doctor about  the best form of exercise for you (dancing, walking, swimming, cycling).  DIET/WEIGHT Goal is to maintain a healthy weight  Your discharge diet is:  Diet Order            DIET DYS 2 Room service appropriate? Yes; Fluid consistency: Nectar Thick  Diet effective now              liquids Your height is:  Height: 5\' 7"  (170.2 cm) Your current weight is: Weight: 104.8 kg Your Body Mass Index (BMI) is:  BMI (Calculated): 36.18  Following the type of  diet specifically designed for you will help prevent another stroke.  Your goal weight range is:    Your goal Body Mass Index (BMI) is 19-24.  Healthy food habits can help reduce 3 risk factors for stroke:  High cholesterol, hypertension, and excess weight.  RESOURCES Stroke/Support Group:  Call [phone]   STROKE EDUCATION PROVIDED/REVIEWED AND GIVEN TO PATIENT Stroke warning signs and symptoms How to activate emergency medical system (call 911). Medications prescribed at discharge. Need for follow-up after discharge. Personal risk factors for stroke. Pneumonia vaccine given:  Flu vaccine given:  My questions have been answered, the writing is legible, and I understand these instructions.  I will adhere to these goals & educational materials that have been provided to me after my discharge from the hospital.      My questions have been answered and I understand these instructions. I will adhere to these goals and the provided educational materials after my discharge from the hospital.  Patient/Caregiver Signature _______________________________ Date __________  Clinician Signature _______________________________________ Date __________  Please bring this form and your medication list with you to all your follow-up doctor's appointments.

## 2019-03-08 NOTE — Progress Notes (Signed)
Occupational Therapy Session Note  Patient Details  Name: [name] MRN: [id number] Date of Birth: [birth year]  Today's Date: 03/08/2019 OT Individual Time: 1050-1200 OT Individual Time Calculation (min): 70 min    Short Term Goals: Week 4:  OT Short Term Goal 1 (Week 4): STGs = LTGs with a focus on family education  Skilled Therapeutic Interventions/Progress Updates:    Pt received sitting in w/c leaning forward almost half way out of the seat.  Pt stated "I am so tired, I have to lie down".  Reminded pt to not get up by herself.  Pt declined changing clothes or bathing due to fatigue. She did have a thorough shower yesterday.  She did sit at sink to complete oral care and grooming.    Pt initially had great difficulty coming to stand from w/c to prepare for transfer to bed.  She kept saying " I just cant do it!"  Had a nurse come in to be a 2nd person support.  On first trial, mod A then 2nd trial she was able to stand with min A. Stand pivot to bed with RW with mod cues and min A to guide LLE.  Pt did better with small steps to control LLE coordination.  After pt sat down on bed she was able to easily scoot back onto bed.  Sit to sidelying min A and then moved into supine.   Adjusted with HOB elevated and pt worked on her HEP.  Her dtr was not present today, so left a printed copy of the band and putty exercises pt can do at home.    Pt completed some coordination exercises and strength tests for discharge.   Pt resting in bed with all needs met. Alarm set.    Therapy Documentation Precautions:  Precautions Precautions: Fall, Other (comment) Precaution Comments: L inattention, left hemiparesis Restrictions Weight Bearing Restrictions: No   Pain: no c/o pain   ADL: ADL Eating: Supervision/safety Grooming: Supervision/safety Where Assessed-Grooming: Sitting at sink Upper Body Bathing: Supervision/safety Where Assessed-Upper Body Bathing: Shower Lower Body Bathing:  Contact guard Where Assessed-Lower Body Bathing: Shower Upper Body Dressing: Minimal assistance Where Assessed-Upper Body Dressing: Wheelchair Lower Body Dressing: Moderate assistance Where Assessed-Lower Body Dressing: Wheelchair Toileting: Moderate assistance Where Assessed-Toileting: Glass blower/designer: Psychiatric nurse Method: Arts development officer: Grab bars, Raised toilet seat Social research officer, government: Environmental education officer Method: Radiographer, therapeutic: Radio broadcast assistant, Grab bars   Therapy/Group: Individual Therapy  [place] 03/08/2019, 9:29 AM

## 2019-03-08 NOTE — Progress Notes (Signed)
Physical Therapy Discharge Summary  Patient Details  Name: Christy Carter MRN: [id number] Date of Birth: 1934-04-02  Today's Date: 03/08/2019 PT Individual Time: 0017-4944 PT Individual Time Calculation (min): 56 min    Patient has met 8 of 9 long term goals due to improved activity tolerance, improved balance, improved postural control, increased strength and improved awareness.  Patient to discharge at a wheelchair level [place].   Patient's care partner is independent to provide the necessary physical assistance at discharge.  Reasons goals not met: Pt did not meet her ambulation goal as she fatigue's quickly and only ambulates up to 50 ft, she also requires increased assist (mod assist) with fatigue and increased gait distances. Pt's daughter is able to provided needed assist for short distance gait.   Recommendation:  Patient will benefit from ongoing skilled PT services in home health setting to continue to advance safe functional mobility, address ongoing impairments in balance, strength, transfers, and minimize fall risk.  Equipment: 20x18 w/c and RW  Reasons for discharge: treatment goals met  Patient/family agrees with progress made and goals achieved: Yes   PT Treatment Interventions: Pt supine in bed upon PT arrival, agreeable to therapy tx and denies pain. Pt transferred to sitting EOB with min assist and cues for techniques/sequencing. In sitting therapist donned shoes for time management. Stand pivot to w/c this session with min assist. Pt performed w/c propulsion this session x 100 ft using B LEs with cues for techniques, attention and awareness, increased time to complete. Pt ambulated 50 ft this session with RW and min-mod assist, cues for upright posture, increased step length, foot placement with turns and RW management. Pt transported to ortho gym and performed stand pivot transfer from w/c<>car with min assist and RW, cues for sequencing and techniques. Pt performed x  5 sit<>stands from elevated mat this session with RW and min assist for LE strengthening. Pt transported back to room and left in w/c with needs in reach and chair alarm set.   PT Discharge Precautions/Restrictions Precautions Precautions: Fall;Other (comment) Precaution Comments: L inattention, left hemiparesis Restrictions Weight Bearing Restrictions: No Pain  denies pain.  Vision/Perception  Vision - Assessment Ocular Range of Motion: Within Functional Limits Alignment/Gaze Preference: Within Defined Limits Tracking/Visual Pursuits: Decreased smoothness of horizontal tracking;Left eye does not track laterally;Decreased smoothness of eye movement to LEFT superior field;Decreased smoothness of eye movement to LEFT inferior field Saccades: Decreased speed of saccadic movement Convergence: Within functional limits Perception Inattention/Neglect: Does not attend to left visual field(minimally impaired vs max impaired on admission) Praxis Praxis: Intact  Cognition Overall Cognitive Status: Impaired/Different from baseline Arousal/Alertness: Awake/alert Orientation Level: Oriented to person;Oriented to situation;Oriented to place;Disoriented to time(able to reports month) Attention: Selective Focused Attention: Appears intact Sustained Attention: Impaired Sustained Attention Impairment: Functional basic Memory: Impaired Memory Impairment: Decreased recall of new information;Decreased short term memory Decreased Short Term Memory: Verbal basic;Functional basic Awareness: Impaired Awareness Impairment: Emergent impairment Problem Solving: Impaired Problem Solving Impairment: Functional basic Safety/Judgment: Impaired Sensation Sensation Light Touch: Impaired Detail Light Touch Impaired Details: Impaired LLE Hot/Cold: Appears Intact Proprioception: Impaired by gross assessment(L LE) Stereognosis: Appears Intact Coordination Gross Motor Movements are Fluid and Coordinated:  No Fine Motor Movements are Fluid and Coordinated: No Coordination and Movement Description: generalized weakness and L hemi Finger Nose Finger Test: slow but accurate Motor  Motor Motor: Hemiplegia Motor - Discharge Observations: LLE weakness and incoordination; excellent progress with LUE functional use and strength  Mobility Bed Mobility Bed Mobility:  Supine to Sit;Sit to Supine Rolling Right: Supervision/verbal cueing Rolling Left: Supervision/Verbal cueing Right Sidelying to Sit: Minimal Assistance - Patient > 75% Supine to Sit: Minimal Assistance - Patient > 75% Sit to Supine: Minimal Assistance - Patient > 75% Transfers Transfers: Sit to Stand;Stand to Sit;Stand Pivot Transfers Sit to Stand: Minimal Assistance - Patient > 75% Stand to Sit: Minimal Assistance - Patient > 75% Stand Pivot Transfers: Minimal Assistance - Patient > 75%;Moderate Assistance - Patient 50 - 74% Stand Pivot Transfer Details: Verbal cues for sequencing;Verbal cues for technique;Verbal cues for precautions/safety;Manual facilitation for weight shifting Stand Pivot Transfer Details (indicate cue type and reason): stand pivot with RW, cues for sequencing and RW management Transfer (Assistive device): Rolling walker Locomotion  Gait Ambulation: Yes Gait Assistance: Minimal Assistance - Patient > 75%;Moderate Assistance - Patient 50-74% Gait Distance (Feet): 50 Feet Assistive device: Rolling walker Gait Assistance Details: Verbal cues for sequencing;Verbal cues for technique;Verbal cues for precautions/safety;Manual facilitation for weight shifting Stairs / Additional Locomotion Stairs: No Wheelchair Mobility Wheelchair Mobility: Yes Wheelchair Assistance: Chartered loss adjuster: Both lower extermities Wheelchair Parts Management: Needs assistance Distance: 100 ft  Trunk/Postural Assessment  Cervical Assessment Cervical Assessment: Exceptions to WFL(forward head  posture) Thoracic Assessment Thoracic Assessment: Exceptions to WFL(rounded shoulders) Lumbar Assessment Lumbar Assessment: Exceptions to WFL(posterior pelvic tilt) Postural Control Postural Control: Deficits on evaluation Righting Reactions: delayed Protective Responses: inadequate  Balance Balance Balance Assessed: Yes Static Sitting Balance Static Sitting - Level of Assistance: 7: Independent Dynamic Sitting Balance Dynamic Sitting - Level of Assistance: 5: Stand by assistance Static Standing Balance Static Standing - Level of Assistance: 4: Min assist Extremity Assessment  RLE Assessment RLE Assessment: Exceptions to [facility] Passive Range of Motion (PROM) Comments: limited hip extension and knee extension RLE Strength Right Hip Flexion: 4/5 Right Knee Flexion: 4/5 Right Knee Extension: 4/5 Right Ankle Dorsiflexion: 4-/5 Right Ankle Plantar Flexion: 4-/5 LLE Assessment LLE Assessment: Exceptions to [facility] Passive Range of Motion (PROM) Comments: limited hip extension and knee extension LLE Strength Left Hip Flexion: 4/5 Left Knee Flexion: 4/5 Left Knee Extension: 4/5 Left Ankle Dorsiflexion: 4-/5 Left Ankle Plantar Flexion: 4-/5    Netta Corrigan, PT, DPT, CSRS 03/08/2019, 1:51 PM

## 2019-03-08 NOTE — Progress Notes (Signed)
Pt was anxious through out the night and wanted to sit with the nurses at the desk instead of being alone. Pt was tearful at times. Pt sat in the recliner with the nurse at the desk until 2 am before getting back in the bed. Pt resting in bed at this time.    Pts abdomen is bruised all over causing pain to the patient. Both sides of the abdomen was bleeding in multiple areas. Pt stated she did not want to take the shot in her belly because it is only making her abdomen worse and making her lose her mind. This nurse educated the pt on the importance of the medication, pt continued to refuse. Provider will be notified.

## 2019-03-09 NOTE — Progress Notes (Signed)
[place] PHYSICAL MEDICINE & REHABILITATION PROGRESS NOTE  Subjective/Complaints: Patient seen at the nurses station this.  Her orientation has not changed.  Discussed with nursing, who states patient appeared to be anxious to go home, asking for keys.  ROS: Denies CP, SOB, N/V/D  Objective:   No results found. Recent Labs    03/07/19 1259  WBC 6.1  HGB 13.1  HCT 42.5  PLT 210   Recent Labs    03/07/19 1259  NA 140  K 4.3  CL 104  CO2 26  GLUCOSE 92  BUN 17  CREATININE 1.23*  CALCIUM 9.3    Intake/Output Summary (Last 24 hours) at 03/09/2019 1103 Last data filed at 03/09/2019 0800 Gross per 24 hour  Intake 700 ml  Output -  Net 700 ml     Physical Exam: Vital Signs Blood pressure (!) 140/39, pulse 63, temperature 97.7 F (36.5 C), resp. rate 17, height 5\' 7"  (1.702 m), weight 104.8 kg, SpO2 98 %. Constitutional: No distress . Vital signs reviewed. HENT: Normocephalic.  Atraumatic. Eyes: EOMI. No discharge. Cardiovascular: No JVD. Respiratory: Normal effort.  No stridor. GI: Non-distended. Skin: Warm and dry.  Intact. Psych: Normal mood.  Normal behavior. Musc: Lower extremity edema  Neurological:Alert and oriented, except for date of month Motor: LUE/LLE: 4+/5 proximal to distal, stable RUE/RLE: 5/5 proximal distal, stable  Assessment/Plan: 1. Functional deficits secondary to Left hemiparesis with dysphagia/dysarthriasecondary to right watershed territory infarction in the setting of severe stenosis right M1 segment status post revascularization which require 3+ hours per day of interdisciplinary therapy in a comprehensive inpatient rehab setting.  Physiatrist is providing close team supervision and 24 hour management of active medical problems listed below.  Physiatrist and rehab team continue to assess barriers to discharge/monitor patient progress toward functional and medical goals  Care Tool:  Bathing  Bathing activity did not occur:  Safety/medical concerns Body parts bathed by patient: Right arm, Left arm, Chest, Abdomen, Front perineal area, Buttocks, Right upper leg, Left upper leg, Right lower leg, Left lower leg, Face   Body parts bathed by helper: Right lower leg, Left lower leg     Bathing assist Assist Level: Contact Guard/Touching assist     Upper Body Dressing/Undressing Upper body dressing   What is the patient wearing?: Bra, Pull over shirt    Upper body assist Assist Level: Contact Guard/Touching assist    Lower Body Dressing/Undressing Lower body dressing      What is the patient wearing?: Pants, Incontinence brief     Lower body assist Assist for lower body dressing: Minimal Assistance - Patient > 75%     Toileting Toileting    Toileting assist Assist for toileting: Total Assistance - Patient < 25%     Transfers Chair/bed transfer  Transfers assist     Chair/bed transfer assist level: Minimal Assistance - Patient > 75%     Locomotion Ambulation   Ambulation assist   Ambulation activity did not occur: Safety/medical concerns  Assist level: Minimal Assistance - Patient > 75% Assistive device: Walker-rolling Max distance: 50 ft   Walk 10 feet activity   Assist  Walk 10 feet activity did not occur: Safety/medical concerns  Assist level: Minimal Assistance - Patient > 75% Assistive device: Walker-rolling   Walk 50 feet activity   Assist Walk 50 feet with 2 turns activity did not occur: Safety/medical concerns  Assist level: Minimal Assistance - Patient > 75% Assistive device: Walker-rolling    Walk 150 feet activity  Assist Walk 150 feet activity did not occur: Safety/medical concerns         Walk 10 feet on uneven surface  activity   Assist Walk 10 feet on uneven surfaces activity did not occur: Safety/medical concerns         Wheelchair     Assist Will patient use wheelchair at discharge?: Yes Type of Wheelchair: Manual Wheelchair  activity did not occur: Safety/medical concerns  Wheelchair assist level: Supervision/Verbal cueing Max wheelchair distance: 100 ft    Wheelchair 50 feet with 2 turns activity    Assist    Wheelchair 50 feet with 2 turns activity did not occur: Safety/medical concerns   Assist Level: Supervision/Verbal cueing   Wheelchair 150 feet activity     Assist  Wheelchair 150 feet activity did not occur: Safety/medical concerns       Blood pressure (!) 140/39, pulse 63, temperature 97.7 F (36.5 C), resp. rate 17, height 5\' 7"  (1.702 m), weight 104.8 kg, SpO2 98 %.  Medical Problem List and Plan: 1.Left hemiplegia with dysphagia/dysarthriasecondary to right watershed territory infarction in the setting of severe stenosis right M1 segment status post revascularization as well as small left lateral posterior temporal lobe infarct in right middle cerebellar peduncle infarct  DC today  Will see patient for transitional care management in 1-2 weeks post-discharge 2. Antithrombotics: -DVT/anticoagulation:Subcutaneous heparin -antiplatelet therapy: Aspirin 81 mg daily, Brilinta 90 mg twice daily 3. Pain Management:Tylenol as needed  Added voltaren gel for R foot 4. Mood:Seroquel nightly  -Fluoxetine started on 10/18, improving overall  -team providing ego support also -antipsychotic agents: N/A 5. Neuropsych: She is not fully capable of making decisions onherown behalf. 6. Skin/Wound Care:Routine skin checks 7. Fluids/Electrolytes/Nutrition:Routine in and outs 8. Post strokedysphagia.   D2 nectars, advanced to D3 thins  Encourage fluids  Advance diet as tolerated  9. Hypertension.   Maxide 37.5-25 mg daily DC'd on 10/14 due to AKI  Continue clonidine patch 0.1 mg change every 7 days, Zebeta 2.5 mg daily.   Vitals:   03/08/19 1457 03/08/19 1928  BP: (!) 141/49 (!) 140/39  Pulse: 61 63  Resp: 18 17  Temp: 98.2 F (36.8 C)  97.7 F (36.5 C)  SpO2: 99% 98%   Relatively stable on 10/28 10. Hyperlipidemia. Lipitor 11. Hypothyroidism. Synthroid  TSH very elevated, Synthroid increased on 10/21 12. R foot pain- Mid foot OA:?  Resolved 13.  Leukocytosis: Resolved  WBC 7.1 on 10/17  Afebrile  Continue to monitor 14.  AKI with acute lower UTI  Creatinine 1.23 on 10/26  Encourage fluids  Echo reviewed-EF 65-70% with hyperdynamic left ventricle  IVF changed to nightly on 10/15, DC'd on 10/23  Ucx Enterococcus Faecalis-started on ampicillin on 10/16-10/24 15.  Hyperglycemia: Resolved 16.  Metabolic encephalopathy  Likely multifactorial-AKA +/- hypothyroidism +/- with pseudodementia  See # 4, 14  Improved/stable 17. Sleep disturbance  Low dose Seroquel started on 10/21, increased to 25mg , continue to monitor-may need to decrease if daytime drowsiness, follow up as outpatient  LOS: 26 days A FACE TO [place] 03/09/2019, 11:03 AM

## 2019-03-09 NOTE — Plan of Care (Signed)
  Problem: Consults Goal: RH STROKE PATIENT EDUCATION Description: See Patient Education module for education specifics  Outcome: Completed/Met   Problem: RH BOWEL ELIMINATION Goal: RH STG MANAGE BOWEL WITH ASSISTANCE Description: STG Manage Bowel with min Assistance. Outcome: Completed/Met   Problem: RH BLADDER ELIMINATION Goal: RH STG MANAGE BLADDER WITH ASSISTANCE Description: STG Manage Bladder With min  Assistance Outcome: Completed/Met   Problem: RH SAFETY Goal: RH STG ADHERE TO SAFETY PRECAUTIONS W/ASSISTANCE/DEVICE Description: STG Adhere to Safety Precautions With Cues and reminders. Outcome: Completed/Met   Problem: RH KNOWLEDGE DEFICIT Goal: RH STG INCREASE KNOWLEDGE OF HYPERTENSION Description: Daughter will be able to demonstrate management of htn using handouts and reading resources for medications , bp monitoring and dietary independently. Outcome: Completed/Met Goal: RH STG INCREASE KNOWLEGDE OF HYPERLIPIDEMIA Description: Daughter will be able to demonstrate management of hld using handouts and reading resources for medications and dietary independently. Outcome: Completed/Met Goal: RH STG INCREASE KNOWLEDGE OF STROKE PROPHYLAXIS Description: Daughter will be able to demonstrate management of stroke using handouts and reading resources for medications , bp monitoring and dietary restrictions independently. Outcome: Completed/Met   Problem: RH Ambulation Goal: LTG Patient will ambulate in controlled environment (PT) Description: LTG: Patient will ambulate in a controlled environment, # of feet with assistance (PT). Outcome: Completed/Met

## 2019-03-11 ENCOUNTER — Telehealth: Payer: Self-pay

## 2019-03-11 NOTE — Telephone Encounter (Signed)
Transitional Care call-daughter Earlie Server    1. Are you/is patient experiencing any problems since coming home? No Are there any questions regarding any aspect of care? No 2. Are there any questions regarding medications administration/dosing? Yes, is she suppose to be giving her all her medication, answered all the medications on the discharge medication list Are meds being taken as prescribed? Yes Patient should review meds with caller to confirm 3. Have there been any falls? Yes, one 4. Has Home Health been to the house and/or have they contacted you? No If not, have you tried to contact them? I gave daughter the name and number to [facility] and she will call Can we help you contact them? 5. Are bowels and bladder emptying properly? Yes Are there any unexpected incontinence issues? No If applicable, is patient following bowel/bladder programs? 6. Any fevers, problems with breathing, unexpected pain? No 7. Are there any skin problems or new areas of breakdown? No 8. Has the patient/family member arranged specialty MD follow up (ie cardiology/neurology/renal/surgical/etc)? In the process  Can we help arrange? 9. Does the patient need any other services or support that we can help arrange? No 10. Are caregivers following through as expected in assisting the patient?  Yes 11. Has the patient quit smoking, drinking alcohol, or using drugs as recommended? Yes  Appointment time 9:40 am, arrive time 9:20 am with Dr. Posey Pronto on 03/21/2019 [place] [address]

## 2019-03-14 ENCOUNTER — Telehealth: Payer: Self-pay | Admitting: Internal Medicine

## 2019-03-14 DIAGNOSIS — I69311 Memory deficit following cerebral infarction: Secondary | ICD-10-CM | POA: Diagnosis not present

## 2019-03-14 DIAGNOSIS — R296 Repeated falls: Secondary | ICD-10-CM | POA: Diagnosis not present

## 2019-03-14 DIAGNOSIS — I10 Essential (primary) hypertension: Secondary | ICD-10-CM | POA: Diagnosis not present

## 2019-03-14 DIAGNOSIS — M109 Gout, unspecified: Secondary | ICD-10-CM | POA: Diagnosis not present

## 2019-03-14 DIAGNOSIS — Z8673 Personal history of transient ischemic attack (TIA), and cerebral infarction without residual deficits: Secondary | ICD-10-CM | POA: Diagnosis not present

## 2019-03-14 DIAGNOSIS — Z87891 Personal history of nicotine dependence: Secondary | ICD-10-CM | POA: Diagnosis not present

## 2019-03-14 DIAGNOSIS — M1990 Unspecified osteoarthritis, unspecified site: Secondary | ICD-10-CM | POA: Diagnosis not present

## 2019-03-14 DIAGNOSIS — I69354 Hemiplegia and hemiparesis following cerebral infarction affecting left non-dominant side: Secondary | ICD-10-CM | POA: Diagnosis not present

## 2019-03-14 DIAGNOSIS — I69318 Other symptoms and signs involving cognitive functions following cerebral infarction: Secondary | ICD-10-CM | POA: Diagnosis not present

## 2019-03-14 DIAGNOSIS — E785 Hyperlipidemia, unspecified: Secondary | ICD-10-CM | POA: Diagnosis not present

## 2019-03-14 DIAGNOSIS — Z87440 Personal history of urinary (tract) infections: Secondary | ICD-10-CM | POA: Diagnosis not present

## 2019-03-14 NOTE — Telephone Encounter (Signed)
Dwyane Luo with [facility] [phone]  Verbal orders given today with start of care , Ms Orne is staying with daughter which nurse feels is not a stable environment at all for her, is requesting verbal orders for social worker, skilled nursing and a rollator walker, PT is twice a week for 6 weeks , will have a nurse go in tomorrow for medication reconciliation , that the daughter did not have all her medications at the home today. ,faxing walker orders to [phone] .

## 2019-03-15 DIAGNOSIS — I69311 Memory deficit following cerebral infarction: Secondary | ICD-10-CM | POA: Diagnosis not present

## 2019-03-15 DIAGNOSIS — Z87440 Personal history of urinary (tract) infections: Secondary | ICD-10-CM | POA: Diagnosis not present

## 2019-03-15 DIAGNOSIS — E785 Hyperlipidemia, unspecified: Secondary | ICD-10-CM | POA: Diagnosis not present

## 2019-03-15 DIAGNOSIS — I69318 Other symptoms and signs involving cognitive functions following cerebral infarction: Secondary | ICD-10-CM | POA: Diagnosis not present

## 2019-03-15 DIAGNOSIS — M1990 Unspecified osteoarthritis, unspecified site: Secondary | ICD-10-CM | POA: Diagnosis not present

## 2019-03-15 DIAGNOSIS — I10 Essential (primary) hypertension: Secondary | ICD-10-CM | POA: Diagnosis not present

## 2019-03-15 DIAGNOSIS — M109 Gout, unspecified: Secondary | ICD-10-CM | POA: Diagnosis not present

## 2019-03-15 DIAGNOSIS — I69354 Hemiplegia and hemiparesis following cerebral infarction affecting left non-dominant side: Secondary | ICD-10-CM | POA: Diagnosis not present

## 2019-03-15 DIAGNOSIS — Z87891 Personal history of nicotine dependence: Secondary | ICD-10-CM | POA: Diagnosis not present

## 2019-03-15 DIAGNOSIS — R296 Repeated falls: Secondary | ICD-10-CM | POA: Diagnosis not present

## 2019-03-15 DIAGNOSIS — Z8673 Personal history of transient ischemic attack (TIA), and cerebral infarction without residual deficits: Secondary | ICD-10-CM | POA: Diagnosis not present

## 2019-03-16 DIAGNOSIS — Z87891 Personal history of nicotine dependence: Secondary | ICD-10-CM | POA: Diagnosis not present

## 2019-03-16 DIAGNOSIS — I69311 Memory deficit following cerebral infarction: Secondary | ICD-10-CM | POA: Diagnosis not present

## 2019-03-16 DIAGNOSIS — I69318 Other symptoms and signs involving cognitive functions following cerebral infarction: Secondary | ICD-10-CM | POA: Diagnosis not present

## 2019-03-16 DIAGNOSIS — I69354 Hemiplegia and hemiparesis following cerebral infarction affecting left non-dominant side: Secondary | ICD-10-CM | POA: Diagnosis not present

## 2019-03-16 DIAGNOSIS — Z87440 Personal history of urinary (tract) infections: Secondary | ICD-10-CM | POA: Diagnosis not present

## 2019-03-16 DIAGNOSIS — M109 Gout, unspecified: Secondary | ICD-10-CM | POA: Diagnosis not present

## 2019-03-16 DIAGNOSIS — M1990 Unspecified osteoarthritis, unspecified site: Secondary | ICD-10-CM | POA: Diagnosis not present

## 2019-03-16 DIAGNOSIS — E785 Hyperlipidemia, unspecified: Secondary | ICD-10-CM | POA: Diagnosis not present

## 2019-03-16 DIAGNOSIS — Z8673 Personal history of transient ischemic attack (TIA), and cerebral infarction without residual deficits: Secondary | ICD-10-CM | POA: Diagnosis not present

## 2019-03-16 DIAGNOSIS — R296 Repeated falls: Secondary | ICD-10-CM | POA: Diagnosis not present

## 2019-03-16 DIAGNOSIS — I10 Essential (primary) hypertension: Secondary | ICD-10-CM | POA: Diagnosis not present

## 2019-03-17 DIAGNOSIS — R296 Repeated falls: Secondary | ICD-10-CM | POA: Diagnosis not present

## 2019-03-17 DIAGNOSIS — E785 Hyperlipidemia, unspecified: Secondary | ICD-10-CM | POA: Diagnosis not present

## 2019-03-17 DIAGNOSIS — I69318 Other symptoms and signs involving cognitive functions following cerebral infarction: Secondary | ICD-10-CM | POA: Diagnosis not present

## 2019-03-17 DIAGNOSIS — Z8673 Personal history of transient ischemic attack (TIA), and cerebral infarction without residual deficits: Secondary | ICD-10-CM | POA: Diagnosis not present

## 2019-03-17 DIAGNOSIS — I69354 Hemiplegia and hemiparesis following cerebral infarction affecting left non-dominant side: Secondary | ICD-10-CM | POA: Diagnosis not present

## 2019-03-17 DIAGNOSIS — I69311 Memory deficit following cerebral infarction: Secondary | ICD-10-CM | POA: Diagnosis not present

## 2019-03-17 DIAGNOSIS — Z87891 Personal history of nicotine dependence: Secondary | ICD-10-CM | POA: Diagnosis not present

## 2019-03-17 DIAGNOSIS — M1990 Unspecified osteoarthritis, unspecified site: Secondary | ICD-10-CM | POA: Diagnosis not present

## 2019-03-17 DIAGNOSIS — M109 Gout, unspecified: Secondary | ICD-10-CM | POA: Diagnosis not present

## 2019-03-17 DIAGNOSIS — Z87440 Personal history of urinary (tract) infections: Secondary | ICD-10-CM | POA: Diagnosis not present

## 2019-03-17 DIAGNOSIS — I10 Essential (primary) hypertension: Secondary | ICD-10-CM | POA: Diagnosis not present

## 2019-03-18 DIAGNOSIS — Z87440 Personal history of urinary (tract) infections: Secondary | ICD-10-CM | POA: Diagnosis not present

## 2019-03-18 DIAGNOSIS — R296 Repeated falls: Secondary | ICD-10-CM | POA: Diagnosis not present

## 2019-03-18 DIAGNOSIS — Z87891 Personal history of nicotine dependence: Secondary | ICD-10-CM | POA: Diagnosis not present

## 2019-03-18 DIAGNOSIS — E785 Hyperlipidemia, unspecified: Secondary | ICD-10-CM | POA: Diagnosis not present

## 2019-03-18 DIAGNOSIS — M109 Gout, unspecified: Secondary | ICD-10-CM | POA: Diagnosis not present

## 2019-03-18 DIAGNOSIS — M1990 Unspecified osteoarthritis, unspecified site: Secondary | ICD-10-CM | POA: Diagnosis not present

## 2019-03-18 DIAGNOSIS — I69311 Memory deficit following cerebral infarction: Secondary | ICD-10-CM | POA: Diagnosis not present

## 2019-03-18 DIAGNOSIS — Z8673 Personal history of transient ischemic attack (TIA), and cerebral infarction without residual deficits: Secondary | ICD-10-CM | POA: Diagnosis not present

## 2019-03-18 DIAGNOSIS — I10 Essential (primary) hypertension: Secondary | ICD-10-CM | POA: Diagnosis not present

## 2019-03-18 DIAGNOSIS — I69354 Hemiplegia and hemiparesis following cerebral infarction affecting left non-dominant side: Secondary | ICD-10-CM | POA: Diagnosis not present

## 2019-03-18 DIAGNOSIS — I69318 Other symptoms and signs involving cognitive functions following cerebral infarction: Secondary | ICD-10-CM | POA: Diagnosis not present

## 2019-03-21 ENCOUNTER — Encounter: Payer: Medicare Other | Admitting: Physical Medicine & Rehabilitation

## 2019-03-21 ENCOUNTER — Telehealth (HOSPITAL_COMMUNITY): Payer: Self-pay

## 2019-03-21 DIAGNOSIS — I69311 Memory deficit following cerebral infarction: Secondary | ICD-10-CM | POA: Diagnosis not present

## 2019-03-21 DIAGNOSIS — I69354 Hemiplegia and hemiparesis following cerebral infarction affecting left non-dominant side: Secondary | ICD-10-CM | POA: Diagnosis not present

## 2019-03-21 DIAGNOSIS — R296 Repeated falls: Secondary | ICD-10-CM | POA: Diagnosis not present

## 2019-03-21 DIAGNOSIS — E785 Hyperlipidemia, unspecified: Secondary | ICD-10-CM | POA: Diagnosis not present

## 2019-03-21 DIAGNOSIS — M1990 Unspecified osteoarthritis, unspecified site: Secondary | ICD-10-CM | POA: Diagnosis not present

## 2019-03-21 DIAGNOSIS — I69318 Other symptoms and signs involving cognitive functions following cerebral infarction: Secondary | ICD-10-CM | POA: Diagnosis not present

## 2019-03-21 DIAGNOSIS — M109 Gout, unspecified: Secondary | ICD-10-CM | POA: Diagnosis not present

## 2019-03-21 DIAGNOSIS — I10 Essential (primary) hypertension: Secondary | ICD-10-CM | POA: Diagnosis not present

## 2019-03-21 DIAGNOSIS — Z87440 Personal history of urinary (tract) infections: Secondary | ICD-10-CM | POA: Diagnosis not present

## 2019-03-21 DIAGNOSIS — Z8673 Personal history of transient ischemic attack (TIA), and cerebral infarction without residual deficits: Secondary | ICD-10-CM | POA: Diagnosis not present

## 2019-03-21 DIAGNOSIS — Z87891 Personal history of nicotine dependence: Secondary | ICD-10-CM | POA: Diagnosis not present

## 2019-03-21 NOTE — Telephone Encounter (Signed)
Called to schedule f/u, no answer, left vm. AW 

## 2019-03-22 ENCOUNTER — Telehealth: Payer: Self-pay

## 2019-03-22 DIAGNOSIS — R296 Repeated falls: Secondary | ICD-10-CM | POA: Diagnosis not present

## 2019-03-22 DIAGNOSIS — Z87440 Personal history of urinary (tract) infections: Secondary | ICD-10-CM | POA: Diagnosis not present

## 2019-03-22 DIAGNOSIS — E785 Hyperlipidemia, unspecified: Secondary | ICD-10-CM | POA: Diagnosis not present

## 2019-03-22 DIAGNOSIS — I10 Essential (primary) hypertension: Secondary | ICD-10-CM | POA: Diagnosis not present

## 2019-03-22 DIAGNOSIS — M1990 Unspecified osteoarthritis, unspecified site: Secondary | ICD-10-CM | POA: Diagnosis not present

## 2019-03-22 DIAGNOSIS — Z87891 Personal history of nicotine dependence: Secondary | ICD-10-CM | POA: Diagnosis not present

## 2019-03-22 DIAGNOSIS — M109 Gout, unspecified: Secondary | ICD-10-CM | POA: Diagnosis not present

## 2019-03-22 DIAGNOSIS — I69318 Other symptoms and signs involving cognitive functions following cerebral infarction: Secondary | ICD-10-CM | POA: Diagnosis not present

## 2019-03-22 DIAGNOSIS — I69354 Hemiplegia and hemiparesis following cerebral infarction affecting left non-dominant side: Secondary | ICD-10-CM | POA: Diagnosis not present

## 2019-03-22 DIAGNOSIS — I69311 Memory deficit following cerebral infarction: Secondary | ICD-10-CM | POA: Diagnosis not present

## 2019-03-22 DIAGNOSIS — Z8673 Personal history of transient ischemic attack (TIA), and cerebral infarction without residual deficits: Secondary | ICD-10-CM | POA: Diagnosis not present

## 2019-03-22 NOTE — Telephone Encounter (Signed)
ok 

## 2019-03-23 ENCOUNTER — Telehealth: Payer: Self-pay | Admitting: Nurse Practitioner

## 2019-03-23 DIAGNOSIS — M109 Gout, unspecified: Secondary | ICD-10-CM | POA: Diagnosis not present

## 2019-03-23 DIAGNOSIS — I69318 Other symptoms and signs involving cognitive functions following cerebral infarction: Secondary | ICD-10-CM | POA: Diagnosis not present

## 2019-03-23 DIAGNOSIS — E785 Hyperlipidemia, unspecified: Secondary | ICD-10-CM | POA: Diagnosis not present

## 2019-03-23 DIAGNOSIS — I10 Essential (primary) hypertension: Secondary | ICD-10-CM | POA: Diagnosis not present

## 2019-03-23 DIAGNOSIS — Z8673 Personal history of transient ischemic attack (TIA), and cerebral infarction without residual deficits: Secondary | ICD-10-CM | POA: Diagnosis not present

## 2019-03-23 DIAGNOSIS — Z87891 Personal history of nicotine dependence: Secondary | ICD-10-CM | POA: Diagnosis not present

## 2019-03-23 DIAGNOSIS — I69354 Hemiplegia and hemiparesis following cerebral infarction affecting left non-dominant side: Secondary | ICD-10-CM | POA: Diagnosis not present

## 2019-03-23 DIAGNOSIS — M1990 Unspecified osteoarthritis, unspecified site: Secondary | ICD-10-CM | POA: Diagnosis not present

## 2019-03-23 DIAGNOSIS — R296 Repeated falls: Secondary | ICD-10-CM | POA: Diagnosis not present

## 2019-03-23 DIAGNOSIS — Z87440 Personal history of urinary (tract) infections: Secondary | ICD-10-CM | POA: Diagnosis not present

## 2019-03-23 DIAGNOSIS — I69311 Memory deficit following cerebral infarction: Secondary | ICD-10-CM | POA: Diagnosis not present

## 2019-03-23 NOTE — Telephone Encounter (Signed)
Gave verbal orders for [facility] home health (charlene [phone]) ok'd 3 visits for social work evaluation. beth

## 2019-03-24 DIAGNOSIS — I69318 Other symptoms and signs involving cognitive functions following cerebral infarction: Secondary | ICD-10-CM | POA: Diagnosis not present

## 2019-03-24 DIAGNOSIS — Z87440 Personal history of urinary (tract) infections: Secondary | ICD-10-CM | POA: Diagnosis not present

## 2019-03-24 DIAGNOSIS — R296 Repeated falls: Secondary | ICD-10-CM | POA: Diagnosis not present

## 2019-03-24 DIAGNOSIS — M109 Gout, unspecified: Secondary | ICD-10-CM | POA: Diagnosis not present

## 2019-03-24 DIAGNOSIS — Z8673 Personal history of transient ischemic attack (TIA), and cerebral infarction without residual deficits: Secondary | ICD-10-CM | POA: Diagnosis not present

## 2019-03-24 DIAGNOSIS — Z87891 Personal history of nicotine dependence: Secondary | ICD-10-CM | POA: Diagnosis not present

## 2019-03-24 DIAGNOSIS — I69354 Hemiplegia and hemiparesis following cerebral infarction affecting left non-dominant side: Secondary | ICD-10-CM | POA: Diagnosis not present

## 2019-03-24 DIAGNOSIS — I69311 Memory deficit following cerebral infarction: Secondary | ICD-10-CM | POA: Diagnosis not present

## 2019-03-24 DIAGNOSIS — I10 Essential (primary) hypertension: Secondary | ICD-10-CM | POA: Diagnosis not present

## 2019-03-24 DIAGNOSIS — E785 Hyperlipidemia, unspecified: Secondary | ICD-10-CM | POA: Diagnosis not present

## 2019-03-24 DIAGNOSIS — M1990 Unspecified osteoarthritis, unspecified site: Secondary | ICD-10-CM | POA: Diagnosis not present

## 2019-03-28 ENCOUNTER — Emergency Department: Payer: Medicare Other

## 2019-03-28 ENCOUNTER — Other Ambulatory Visit: Payer: Self-pay

## 2019-03-28 ENCOUNTER — Encounter: Payer: Self-pay | Admitting: Emergency Medicine

## 2019-03-28 ENCOUNTER — Observation Stay: Payer: Medicare Other

## 2019-03-28 ENCOUNTER — Inpatient Hospital Stay
Admission: EM | Admit: 2019-03-28 | Discharge: 2019-03-29 | DRG: 065 | Disposition: A | Discharge status: Other Acute Inpatient Hospital | Payer: Medicare Other | Attending: Internal Medicine | Admitting: Internal Medicine

## 2019-03-28 DIAGNOSIS — M109 Gout, unspecified: Secondary | ICD-10-CM | POA: Diagnosis present

## 2019-03-28 DIAGNOSIS — G4733 Obstructive sleep apnea (adult) (pediatric): Secondary | ICD-10-CM | POA: Diagnosis not present

## 2019-03-28 DIAGNOSIS — I129 Hypertensive chronic kidney disease with stage 1 through stage 4 chronic kidney disease, or unspecified chronic kidney disease: Secondary | ICD-10-CM | POA: Diagnosis not present

## 2019-03-28 DIAGNOSIS — Z8673 Personal history of transient ischemic attack (TIA), and cerebral infarction without residual deficits: Secondary | ICD-10-CM

## 2019-03-28 DIAGNOSIS — Z8619 Personal history of other infectious and parasitic diseases: Secondary | ICD-10-CM | POA: Diagnosis not present

## 2019-03-28 DIAGNOSIS — Z79899 Other long term (current) drug therapy: Secondary | ICD-10-CM

## 2019-03-28 DIAGNOSIS — M064 Inflammatory polyarthropathy: Secondary | ICD-10-CM | POA: Diagnosis present

## 2019-03-28 DIAGNOSIS — I69311 Memory deficit following cerebral infarction: Secondary | ICD-10-CM | POA: Diagnosis not present

## 2019-03-28 DIAGNOSIS — Z87440 Personal history of urinary (tract) infections: Secondary | ICD-10-CM

## 2019-03-28 DIAGNOSIS — Z7982 Long term (current) use of aspirin: Secondary | ICD-10-CM | POA: Diagnosis not present

## 2019-03-28 DIAGNOSIS — Z20828 Contact with and (suspected) exposure to other viral communicable diseases: Secondary | ICD-10-CM | POA: Diagnosis present

## 2019-03-28 DIAGNOSIS — N183 Chronic kidney disease, stage 3 unspecified: Secondary | ICD-10-CM | POA: Diagnosis not present

## 2019-03-28 DIAGNOSIS — M545 Low back pain: Secondary | ICD-10-CM | POA: Diagnosis not present

## 2019-03-28 DIAGNOSIS — E785 Hyperlipidemia, unspecified: Secondary | ICD-10-CM | POA: Diagnosis not present

## 2019-03-28 DIAGNOSIS — E039 Hypothyroidism, unspecified: Secondary | ICD-10-CM | POA: Diagnosis not present

## 2019-03-28 DIAGNOSIS — E782 Mixed hyperlipidemia: Secondary | ICD-10-CM | POA: Diagnosis not present

## 2019-03-28 DIAGNOSIS — Z87891 Personal history of nicotine dependence: Secondary | ICD-10-CM

## 2019-03-28 DIAGNOSIS — I6381 Other cerebral infarction due to occlusion or stenosis of small artery: Principal | ICD-10-CM | POA: Diagnosis present

## 2019-03-28 DIAGNOSIS — Z8249 Family history of ischemic heart disease and other diseases of the circulatory system: Secondary | ICD-10-CM

## 2019-03-28 DIAGNOSIS — D519 Vitamin B12 deficiency anemia, unspecified: Secondary | ICD-10-CM | POA: Diagnosis not present

## 2019-03-28 DIAGNOSIS — R531 Weakness: Secondary | ICD-10-CM | POA: Diagnosis not present

## 2019-03-28 DIAGNOSIS — Z791 Long term (current) use of non-steroidal anti-inflammatories (NSAID): Secondary | ICD-10-CM

## 2019-03-28 DIAGNOSIS — Z9862 Peripheral vascular angioplasty status: Secondary | ICD-10-CM

## 2019-03-28 DIAGNOSIS — Z9049 Acquired absence of other specified parts of digestive tract: Secondary | ICD-10-CM | POA: Diagnosis not present

## 2019-03-28 DIAGNOSIS — G8929 Other chronic pain: Secondary | ICD-10-CM | POA: Diagnosis present

## 2019-03-28 DIAGNOSIS — I6601 Occlusion and stenosis of right middle cerebral artery: Secondary | ICD-10-CM | POA: Diagnosis present

## 2019-03-28 DIAGNOSIS — G479 Sleep disorder, unspecified: Secondary | ICD-10-CM | POA: Diagnosis present

## 2019-03-28 DIAGNOSIS — Z8379 Family history of other diseases of the digestive system: Secondary | ICD-10-CM

## 2019-03-28 DIAGNOSIS — I69354 Hemiplegia and hemiparesis following cerebral infarction affecting left non-dominant side: Secondary | ICD-10-CM | POA: Diagnosis not present

## 2019-03-28 DIAGNOSIS — I10 Essential (primary) hypertension: Secondary | ICD-10-CM | POA: Diagnosis not present

## 2019-03-28 DIAGNOSIS — Z7902 Long term (current) use of antithrombotics/antiplatelets: Secondary | ICD-10-CM

## 2019-03-28 DIAGNOSIS — I639 Cerebral infarction, unspecified: Secondary | ICD-10-CM | POA: Diagnosis not present

## 2019-03-28 DIAGNOSIS — R296 Repeated falls: Secondary | ICD-10-CM | POA: Diagnosis not present

## 2019-03-28 DIAGNOSIS — Z743 Need for continuous supervision: Secondary | ICD-10-CM | POA: Diagnosis not present

## 2019-03-28 DIAGNOSIS — Z8701 Personal history of pneumonia (recurrent): Secondary | ICD-10-CM

## 2019-03-28 DIAGNOSIS — Z79890 Hormone replacement therapy (postmenopausal): Secondary | ICD-10-CM

## 2019-03-28 DIAGNOSIS — F339 Major depressive disorder, recurrent, unspecified: Secondary | ICD-10-CM | POA: Diagnosis present

## 2019-03-28 DIAGNOSIS — J90 Pleural effusion, not elsewhere classified: Secondary | ICD-10-CM | POA: Diagnosis not present

## 2019-03-28 DIAGNOSIS — I69318 Other symptoms and signs involving cognitive functions following cerebral infarction: Secondary | ICD-10-CM | POA: Diagnosis not present

## 2019-03-28 DIAGNOSIS — M1990 Unspecified osteoarthritis, unspecified site: Secondary | ICD-10-CM | POA: Diagnosis not present

## 2019-03-28 LAB — URINALYSIS, COMPLETE (UACMP) WITH MICROSCOPIC
Bacteria, UA: NONE SEEN
Bilirubin Urine: NEGATIVE
Glucose, UA: NEGATIVE mg/dL
Ketones, ur: NEGATIVE mg/dL
Leukocytes,Ua: NEGATIVE
Nitrite: NEGATIVE
Protein, ur: NEGATIVE mg/dL
RBC / HPF: >50 RBC/hpf — ABNORMAL HIGH (ref 0–5)
Specific Gravity, Urine: 1.016 (ref 1.005–1.030)
pH: 5 (ref 5.0–8.0)

## 2019-03-28 LAB — COMPREHENSIVE METABOLIC PANEL
ALT: 10 U/L (ref 0–44)
AST: 21 U/L (ref 15–41)
Albumin: 3.7 g/dL (ref 3.5–5.0)
Alkaline Phosphatase: 97 U/L (ref 38–126)
Anion gap: 12 (ref 5–15)
BUN: 21 mg/dL (ref 8–23)
CO2: 25 mmol/L (ref 22–32)
Calcium: 9.6 mg/dL (ref 8.9–10.3)
Chloride: 103 mmol/L (ref 98–111)
Creatinine, Ser: 0.86 mg/dL (ref 0.44–1.00)
GFR calc Af Amer: >60 mL/min (ref >60)
GFR calc non Af Amer: >60 mL/min (ref >60)
Glucose, Bld: 102 mg/dL — ABNORMAL HIGH (ref 70–99)
Potassium: 4.2 mmol/L (ref 3.5–5.1)
Sodium: 140 mmol/L (ref 135–145)
Total Bilirubin: 1 mg/dL (ref 0.3–1.2)
Total Protein: 7.2 g/dL (ref 6.5–8.1)

## 2019-03-28 LAB — CBC WITH DIFFERENTIAL/PLATELET
Abs Immature Granulocytes: 0.11 10*3/uL — ABNORMAL HIGH (ref 0.00–0.07)
Basophils Absolute: 0.1 10*3/uL (ref 0.0–0.1)
Basophils Relative: 1 %
Eosinophils Absolute: 0.5 10*3/uL (ref 0.0–0.5)
Eosinophils Relative: 5 %
HCT: 40.1 % (ref 36.0–46.0)
Hemoglobin: 12.6 g/dL (ref 12.0–15.0)
Immature Granulocytes: 1 %
Lymphocytes Relative: 11 %
Lymphs Abs: 1.1 10*3/uL (ref 0.7–4.0)
MCH: 29.6 pg (ref 26.0–34.0)
MCHC: 31.4 g/dL (ref 30.0–36.0)
MCV: 94.1 fL (ref 80.0–100.0)
Monocytes Absolute: 0.9 10*3/uL (ref 0.1–1.0)
Monocytes Relative: 10 %
Neutro Abs: 6.9 10*3/uL (ref 1.7–7.7)
Neutrophils Relative %: 72 %
Platelets: 277 10*3/uL (ref 150–400)
RBC: 4.26 MIL/uL (ref 3.87–5.11)
RDW: 15.9 % — ABNORMAL HIGH (ref 11.5–15.5)
WBC: 9.5 10*3/uL (ref 4.0–10.5)
nRBC: 0 % (ref 0.0–0.2)

## 2019-03-28 LAB — PROTIME-INR
INR: 1 (ref 0.8–1.2)
Prothrombin Time: 13 seconds (ref 11.4–15.2)

## 2019-03-28 LAB — APTT: aPTT: 27 seconds (ref 24–36)

## 2019-03-28 MED ORDER — QUETIAPINE FUMARATE 25 MG PO TABS
25.0000 mg | ORAL_TABLET | Freq: Every day | ORAL | Status: DC
  Administered 2019-03-29: 25 mg via ORAL
  Filled 2019-03-28: qty 1

## 2019-03-28 MED ORDER — ACETAMINOPHEN 160 MG/5ML PO SOLN
650.0000 mg | ORAL | Status: DC | PRN
  Filled 2019-03-28: qty 20.3

## 2019-03-28 MED ORDER — STROKE: EARLY STAGES OF RECOVERY BOOK: Freq: Once | Status: DC

## 2019-03-28 MED ORDER — CLONIDINE HCL 0.1 MG/24HR TD PTWK: 0.1000 mg | MEDICATED_PATCH | TRANSDERMAL | Status: DC

## 2019-03-28 MED ORDER — ASPIRIN 81 MG PO CHEW
CHEWABLE_TABLET | ORAL | Status: AC
  Filled 2019-03-28: qty 4

## 2019-03-28 MED ORDER — FLUOXETINE HCL 10 MG PO CAPS
10.0000 mg | ORAL_CAPSULE | Freq: Every day | ORAL | Status: DC
  Administered 2019-03-29: 10 mg via ORAL
  Filled 2019-03-28: qty 1

## 2019-03-28 MED ORDER — SODIUM CHLORIDE 0.9 % IV BOLUS
500.0000 mL | Freq: Once | INTRAVENOUS | Status: AC
  Administered 2019-03-28: 500 mL via INTRAVENOUS

## 2019-03-28 MED ORDER — ASPIRIN 81 MG PO CHEW
324.0000 mg | CHEWABLE_TABLET | Freq: Once | ORAL | Status: AC
  Administered 2019-03-28: 324 mg via ORAL
  Filled 2019-03-28: qty 4

## 2019-03-28 MED ORDER — LEVOTHYROXINE SODIUM 150 MCG PO TABS
150.0000 ug | ORAL_TABLET | Freq: Every day | ORAL | Status: DC
  Administered 2019-03-29: 150 ug via ORAL
  Filled 2019-03-28: qty 1

## 2019-03-28 MED ORDER — ALLOPURINOL 100 MG PO TABS
200.0000 mg | ORAL_TABLET | Freq: Every day | ORAL | Status: DC
  Filled 2019-03-28 (×2): qty 2

## 2019-03-28 MED ORDER — ASPIRIN EC 81 MG PO TBEC
81.0000 mg | DELAYED_RELEASE_TABLET | Freq: Every day | ORAL | Status: DC
  Administered 2019-03-29: 81 mg via ORAL
  Filled 2019-03-28: qty 1

## 2019-03-28 MED ORDER — COLCHICINE 0.6 MG PO TABS
0.6000 mg | ORAL_TABLET | Freq: Two times a day (BID) | ORAL | Status: DC
  Administered 2019-03-29: 0.6 mg via ORAL
  Filled 2019-03-28 (×3): qty 1

## 2019-03-28 MED ORDER — SODIUM CHLORIDE 0.9 % IV SOLN
INTRAVENOUS | Status: DC
  Administered 2019-03-28 – 2019-03-29 (×2): via INTRAVENOUS

## 2019-03-28 MED ORDER — ACETAMINOPHEN 650 MG RE SUPP: 650.0000 mg | RECTAL | Status: DC | PRN

## 2019-03-28 MED ORDER — ENOXAPARIN SODIUM 40 MG/0.4ML ~~LOC~~ SOLN
40.0000 mg | SUBCUTANEOUS | Status: DC
  Administered 2019-03-29: 40 mg via SUBCUTANEOUS
  Filled 2019-03-28: qty 0.4

## 2019-03-28 MED ORDER — ALBUTEROL SULFATE HFA 108 (90 BASE) MCG/ACT IN AERS
2.0000 | INHALATION_SPRAY | RESPIRATORY_TRACT | Status: DC | PRN
  Administered 2019-03-29: 2 via RESPIRATORY_TRACT
  Filled 2019-03-28: qty 6.7

## 2019-03-28 MED ORDER — ACETAMINOPHEN 325 MG PO TABS: 650.0000 mg | ORAL_TABLET | ORAL | Status: DC | PRN

## 2019-03-28 MED ORDER — PANTOPRAZOLE SODIUM 40 MG PO TBEC
40.0000 mg | DELAYED_RELEASE_TABLET | Freq: Every day | ORAL | Status: DC
  Administered 2019-03-29: 40 mg via ORAL
  Filled 2019-03-28: qty 1

## 2019-03-28 MED ORDER — TICAGRELOR 90 MG PO TABS
90.0000 mg | ORAL_TABLET | Freq: Two times a day (BID) | ORAL | Status: DC
  Administered 2019-03-29 (×2): 90 mg via ORAL
  Filled 2019-03-28 (×2): qty 1

## 2019-03-28 MED ORDER — BISOPROLOL FUMARATE 5 MG PO TABS
2.5000 mg | ORAL_TABLET | Freq: Every day | ORAL | Status: DC
  Administered 2019-03-29: 2.5 mg via ORAL
  Filled 2019-03-28: qty 0.5

## 2019-03-28 MED ORDER — ATORVASTATIN CALCIUM 20 MG PO TABS: 40.0000 mg | ORAL_TABLET | Freq: Every day | ORAL | Status: DC

## 2019-03-28 NOTE — H&P (Addendum)
History and Physical    Christy Carter [id number] DOB: 1933-07-14 DOA: 03/28/2019  PCP: Lavera Guise, MD  Patient coming from: Home.  History obtained from patient's daughter and patient and ER physician and previous chart.  Chief Complaint: Weakness.  HPI: Christy Carter is a 85 y.o. female with history of stroke in September 2020 discharged home after being in rehab on March 09 2019 3 weeks ago has been doing poorly as per the patient's daughter over the last 1 week.  Patient has become confused difficulty walking with gait imbalance.  During the last admission for stroke patient had required intervention with angioplasty and is on Brilinta.  Over the last 1 week patient is noticed to have increased weakness and this morning when patient's physical therapist came to evaluate the patient patient was found to be weak and falling more towards the left side and whenever she stands up for the last 1 week was noted to have increased weakness in the left lower extremity.  Given the symptoms patient was brought to the ER.  Did not have any difficulty talking or swallowing.  Has not lost consciousness.  No visual symptoms.  No changes in medications recently.  Patient states she had a fall though daughter states she did not.  ED Course: In the ER patient is found to have mild weakness of the left upper and lower extremity which is chronic.  CT head and C-spine were done.  Which were unremarkable per MRI of the brain shows lateral infarct involving the right middle frontal gyrus.  UA shows some RBCs WBCs 9.5 hemoglobin 12.6 platelets 277 EKG is pending monitor shows sinus rhythm potassium 4.2.  Patient is admitted for acute CVA.  Patient passed stroke swallow.  COVID-19 test is pending.  Review of Systems: As per HPI, rest all negative.   Past Medical History:  Diagnosis Date  . Arthritis   . Carpal tunnel syndrome   . Complication of anesthesia    Difficulty waking up. Believes it took 2 days  to wake up after her surgery.  . Depression   . Hyperlipidemia   . Hypertension   . Shortness of breath   . Stroke ([place])   . Thyroid disease     Past Surgical History:  Procedure Laterality Date  . APPENDECTOMY    . BACK SURGERY    . CHOLECYSTECTOMY    . HERNIA REPAIR    . IR CT HEAD LTD  02/05/2019  . IR PTA INTRACRANIAL  02/05/2019  . RADIOLOGY WITH ANESTHESIA N/A 02/05/2019   Procedure: CODE STROKE;  Surgeon: Radiologist, Medication, MD;  Location: [facility];  Service: Radiology;  Laterality: N/A;     reports that she quit smoking about 26 years ago. Her smoking use included cigarettes. She has never used smokeless tobacco. She reports that she does not drink alcohol or use drugs.  No Known Allergies  Family History  Problem Relation Age of Onset  . Heart disease Mother   . Hypertension Mother   . Diverticulitis Mother   . Heart disease Father   . Hypertension Father     Prior to Admission medications   Medication Sig Start Date End Date Taking? Authorizing Provider  acetaminophen (TYLENOL) 325 MG tablet Take 2 tablets (650 mg total) by mouth every 4 (four) hours as needed for mild pain (or temp > 37.5 C (99.5 F)). 03/08/19  Yes Angiulli, Lavon Paganini, PA-C  allopurinol (ZYLOPRIM) 100 MG tablet Take 2 tabs at bed time  for gout 01/12/19  Yes Ronnell Freshwater, NP  aspirin EC 81 MG EC tablet Take 1 tablet (81 mg total) by mouth daily. 02/11/19  Yes Donzetta Starch, NP  atorvastatin (LIPITOR) 40 MG tablet Take 1 tablet (40 mg total) by mouth daily at 6 PM. 03/08/19  Yes Angiulli, Lavon Paganini, PA-C  bisoprolol (ZEBETA) 5 MG tablet Take 0.5 tablets (2.5 mg total) by mouth daily. 03/08/19  Yes Angiulli, Lavon Paganini, PA-C  cloNIDine (CATAPRES - DOSED IN MG/24 HR) 0.1 mg/24hr patch Place 1 patch (0.1 mg total) onto the skin every Monday. 03/14/19  Yes Angiulli, Lavon Paganini, PA-C  colchicine 0.6 MG tablet Take 1 tablet (0.6 mg total) by mouth 2 (two) times daily. 12/06/18  Yes Boscia, Heather E, NP   Cyanocobalamin (B-12) 2500 MCG TABS Take 2,500 mcg by mouth daily. As needed for lethargy   Yes [provider]  diclofenac sodium (VOLTAREN) 1 % GEL Apply 4 g topically 4 (four) times daily. Patient taking differently: Apply 4 g topically 4 (four) times daily as needed.  03/08/19  Yes Angiulli, Lavon Paganini, PA-C  FLUoxetine (PROZAC) 10 MG capsule Take 1 capsule (10 mg total) by mouth daily. 03/08/19  Yes Angiulli, Lavon Paganini, PA-C  levothyroxine (SYNTHROID) 150 MCG tablet Take 1 tablet (150 mcg total) by mouth daily before breakfast. 03/08/19  Yes Angiulli, Lavon Paganini, PA-C  omeprazole (PRILOSEC) 40 MG capsule Take 1 capsule (40 mg total) by mouth daily. 03/08/19  Yes Angiulli, Lavon Paganini, PA-C  QUEtiapine (SEROQUEL) 25 MG tablet Take 1 tablet (25 mg total) by mouth at bedtime. 03/08/19  Yes Angiulli, Lavon Paganini, PA-C  ticagrelor (BRILINTA) 90 MG TABS tablet Take 1 tablet (90 mg total) by mouth 2 (two) times daily. 03/08/19  Yes Angiulli, Lavon Paganini, PA-C  VENTOLIN HFA 108 (90 Base) MCG/ACT inhaler TAKE 2 PUFFS BY MOUTH EVERY 6 HOURS AS NEEDED FOR WHEEZE OR SHORTNESS OF BREATH 03/19/18  Yes Boscia, Heather E, NP  Vitamin D, Ergocalciferol, (DRISDOL) 1.25 MG (50000 UT) CAPS capsule Take 1 capsule (50,000 Units total) by mouth every 7 (seven) days. 02/02/19  Yes Kendell Bane, NP    Physical Exam: Constitutional: Moderately built and nourished. Vitals:   03/28/19 1443 03/28/19 1449 03/28/19 1656  BP: (!) 134/41  (!) 150/45  Pulse: 73  83  Resp: (!) 24  20  Temp: 97.8 F (36.6 C)    TempSrc: Oral    SpO2: 97%  97%  Weight:  84.4 kg   Height:  5\' 7"  (1.702 m)    Eyes: Anicteric no pallor. ENMT: No discharge from the ears eyes nose or mouth. Neck: No mass or.  No neck rigidity. Respiratory: No rhonchi or crepitations. Cardiovascular: S1-S2 heard. Abdomen: Soft nontender bowel sounds present.  No guarding or rigidity. Musculoskeletal: No edema. Skin: No rash. Neurologic: Alert awake  oriented to time place and person.  Mild weakness of the left upper and lower extremity right upper and lower extremities 5 x 5.  No facial asymmetry tongue is midline.  Pupils are equal reacting to light. Psychiatric: Appears normal per normal affect.   Labs on Admission: I have personally reviewed following labs and imaging studies  CBC: Recent Labs  Lab 03/28/19 1457  WBC 9.5  NEUTROABS 6.9  HGB 12.6  HCT 40.1  MCV 94.1  PLT 99991111   Basic Metabolic Panel: Recent Labs  Lab 03/28/19 1457  NA 140  K 4.2  CL 103  CO2 25  GLUCOSE  102*  BUN 21  CREATININE 0.86  CALCIUM 9.6   GFR: Estimated Creatinine Clearance: 55.3 mL/min (by C-G formula based on SCr of 0.86 mg/dL). Liver Function Tests: Recent Labs  Lab 03/28/19 1457  AST 21  ALT 10  ALKPHOS 97  BILITOT 1.0  PROT 7.2  ALBUMIN 3.7   No results for input(s): LIPASE, AMYLASE in the last 168 hours. No results for input(s): AMMONIA in the last 168 hours. Coagulation Profile: Recent Labs  Lab 03/28/19 2114  INR 1.0   Cardiac Enzymes: No results for input(s): CKTOTAL, CKMB, CKMBINDEX, TROPONINI in the last 168 hours. BNP (last 3 results) No results for input(s): PROBNP in the last 8760 hours. HbA1C: No results for input(s): HGBA1C in the last 72 hours. CBG: No results for input(s): GLUCAP in the last 168 hours. Lipid Profile: No results for input(s): CHOL, HDL, LDLCALC, TRIG, CHOLHDL, LDLDIRECT in the last 72 hours. Thyroid Function Tests: No results for input(s): TSH, T4TOTAL, FREET4, T3FREE, THYROIDAB in the last 72 hours. Anemia Panel: No results for input(s): VITAMINB12, FOLATE, FERRITIN, TIBC, IRON, RETICCTPCT in the last 72 hours. Urine analysis:    Component Value Date/Time   COLORURINE YELLOW (A) 03/28/2019 1626   APPEARANCEUR CLEAR (A) 03/28/2019 1626   APPEARANCEUR Cloudy (A) 01/12/2019 0911   LABSPEC 1.016 03/28/2019 1626   LABSPEC 1.018 11/22/2013 1637   PHURINE 5.0 03/28/2019 1626    GLUCOSEU NEGATIVE 03/28/2019 1626   GLUCOSEU Negative 11/22/2013 1637   HGBUR MODERATE (A) 03/28/2019 1626   BILIRUBINUR NEGATIVE 03/28/2019 1626   BILIRUBINUR negative 02/02/2019 1033   BILIRUBINUR Negative 01/12/2019 0911   BILIRUBINUR Negative 11/22/2013 1637   KETONESUR NEGATIVE 03/28/2019 1626   PROTEINUR NEGATIVE 03/28/2019 1626   UROBILINOGEN 0.2 02/02/2019 1033   NITRITE NEGATIVE 03/28/2019 1626   LEUKOCYTESUR NEGATIVE 03/28/2019 1626   LEUKOCYTESUR Negative 11/22/2013 1637   Sepsis Labs: @LABRCNTIP (procalcitonin:4,lacticidven:4) )No results found for this or any previous visit (from the past 240 hour(s)).   Radiological Exams on Admission: Dg Chest 2 View  Result Date: 03/28/2019 CLINICAL DATA:  Generalized weakness. EXAM: CHEST - 2 VIEW COMPARISON:  02/17/2019 and CT chest 10/13/2018. FINDINGS: Patient is rotated. Heart is enlarged, stable. Thoracic aorta is calcified. Mild bibasilar streaky opacification. No dense airspace consolidation. Tiny left pleural effusion. Left hemidiaphragm is elevated. IMPRESSION: 1. Streaky bibasilar airspace opacification may be due to atelectasis. A viral or atypical pneumonia cannot be excluded. 2. Tiny left pleural effusion. 3.  Aortic atherosclerosis (ICD10-170.0). Electronically Signed   By: Lorin Picket M.D.   On: 03/28/2019 15:38   Ct Head Wo Contrast  Result Date: 03/28/2019 CLINICAL DATA:  Worsening weakness over the last 2 days. EXAM: CT HEAD WITHOUT CONTRAST CT CERVICAL SPINE WITHOUT CONTRAST TECHNIQUE: Multidetector CT imaging of the head and cervical spine was performed following the standard protocol without intravenous contrast. Multiplanar CT image reconstructions of the cervical spine were also generated. COMPARISON:  MRI 02/07/2019.  CT 02/05/2019 FINDINGS: CT HEAD FINDINGS Brain: Chronic atrophy and small-vessel ischemic change of the white matter. No sign of acute infarction, mass lesion, hemorrhage, obstructive hydrocephalus  or extra-axial collection. Ventriculomegaly secondary to central atrophy. Vascular: There is atherosclerotic calcification of the major vessels at the base of the brain. Skull: Negative Sinuses/Orbits: Clear/normal Other: None CT CERVICAL SPINE FINDINGS Alignment: Normal Skull base and vertebrae: No fracture or primary bone lesion. Soft tissues and spinal canal: Negative Disc levels: Degenerative spondylosis from C3-4 through C7-T1 with disc space narrowing and small osteophytes. Facet  osteoarthritis on the right at C3-4 and C4-5 and on the left at C4-5. Mild foraminal narrowing on the right at C3-4 and C4-5. No traumatic finding. Upper chest: Negative Other: None IMPRESSION: Head CT: No acute or traumatic finding. Atrophy and chronic small-vessel ischemic changes. Cervical spine CT: No acute or traumatic finding. Degenerative spondylosis and facet osteoarthritis. Electronically Signed   By: Nelson Chimes M.D.   On: 03/28/2019 15:47   Ct Cervical Spine Wo Contrast  Result Date: 03/28/2019 CLINICAL DATA:  Worsening weakness over the last 2 days. EXAM: CT HEAD WITHOUT CONTRAST CT CERVICAL SPINE WITHOUT CONTRAST TECHNIQUE: Multidetector CT imaging of the head and cervical spine was performed following the standard protocol without intravenous contrast. Multiplanar CT image reconstructions of the cervical spine were also generated. COMPARISON:  MRI 02/07/2019.  CT 02/05/2019 FINDINGS: CT HEAD FINDINGS Brain: Chronic atrophy and small-vessel ischemic change of the white matter. No sign of acute infarction, mass lesion, hemorrhage, obstructive hydrocephalus or extra-axial collection. Ventriculomegaly secondary to central atrophy. Vascular: There is atherosclerotic calcification of the major vessels at the base of the brain. Skull: Negative Sinuses/Orbits: Clear/normal Other: None CT CERVICAL SPINE FINDINGS Alignment: Normal Skull base and vertebrae: No fracture or primary bone lesion. Soft tissues and spinal canal:  Negative Disc levels: Degenerative spondylosis from C3-4 through C7-T1 with disc space narrowing and small osteophytes. Facet osteoarthritis on the right at C3-4 and C4-5 and on the left at C4-5. Mild foraminal narrowing on the right at C3-4 and C4-5. No traumatic finding. Upper chest: Negative Other: None IMPRESSION: Head CT: No acute or traumatic finding. Atrophy and chronic small-vessel ischemic changes. Cervical spine CT: No acute or traumatic finding. Degenerative spondylosis and facet osteoarthritis. Electronically Signed   By: Nelson Chimes M.D.   On: 03/28/2019 15:47   Mr Brain Wo Contrast  Result Date: 03/28/2019 CLINICAL DATA:  85 year old female with increasing weakness for 2 days. Status post stroke in September. History of multiple severe intracranial stenoses EXAM: MRI HEAD WITHOUT CONTRAST TECHNIQUE: Multiplanar, multiecho pulse sequences of the brain and surrounding structures were obtained without intravenous contrast. COMPARISON:  Brain MRI and intracranial MRA 02/07/2019 and earlier. FINDINGS: Brain: Expected evolution of the right corona radiata infarct since September. Small new 4-5 millimeter area of right middle frontal gyrus subcortical white matter restricted diffusion on series 9, image 34 and series 22, image 21. T2 and FLAIR hyperintensity with no hemorrhage or mass effect. Developing cystic encephalomalacia in the right corona radiata. No cortical encephalomalacia identified. Small chronic infarcts in the right PICA territory. Patchy and confluent bilateral periventricular white matter T2 and FLAIR hyperintensity. Chronic microhemorrhages in the right periatrial white matter. Mild superficial siderosis along the left lateral occipital lobe is also stable on series 17, image 24. No midline shift, mass effect, evidence of mass lesion, ventriculomegaly, extra-axial collection or acute intracranial hemorrhage. Cervicomedullary junction and pituitary are within normal limits. Vascular:  Major intracranial vascular flow voids are stable, with evidence of decreased flow in the distal right vertebral artery. Skull and upper cervical spine: Negative visible cervical spine. Visualized bone marrow signal is within normal limits. Sinuses/Orbits: Stable and negative. Other: Mastoids remain clear. Scalp and face soft tissues appear negative. IMPRESSION: 1. Small acute lacunar infarct in subcortical white matter of the right middle frontal gyrus. No associated hemorrhage or mass effect. 2. Expected evolution of the right corona radiata infarct since September. Otherwise stable chronic small vessel disease. Mild left occipital lobe superficial siderosis. 3. Chronically abnormal flow in the  distal right vertebral artery. Electronically Signed   By: Genevie Ann M.D.   On: 03/28/2019 19:40     Assessment/Plan Principal Problem:   Acute CVA (cerebrovascular accident) [facility]) Active Problems:   Hypothyroidism   Essential hypertension   CKD (chronic kidney disease), stage III   Sleep disturbance    1. Acute CVA -discussed with on-call neurologist Dr. Demetra Shiner of [facility].  Advised to get MRA of the head and neck to continue present antiplatelet agents statins neurochecks.  IV hydration and to keep blood pressure in the higher side of normal. 2. Hypothyroidism on Synthroid. 3. Hypertension on clonidine and Zebeta. 4. Hyperlipidemia on statins. 5. History of sleep apnea. 6. History of gout on allopurinol and colchicine.  COVID-19 test is pending.  Addendum -radiologist notified about the recurrent stenosis of the right M1.  Discussed with Dr. Demetra Shiner of neurology who recommended transfer to [facility].  Discussed with patient's daughter who agrees with transfer.  Patient also will be updated.  Dr. Christia Reading Opyd will be the accepting physician.  Bed placement notified.   DVT prophylaxis: Lovenox. Code Status: Full code. Family Communication: Patient's daughter. Disposition Plan:  Home. Consults called: Discussed with neurologist. Admission status: Observation.   Rise Patience MD [facility] Pager [phone].  If 7PM-7AM, please contact night-coverage [email or web address] Password [email or web address]  03/28/2019, 10:16 PM

## 2019-03-28 NOTE — ED Triage Notes (Addendum)
Patient from home via [facility]. Per EMS, they were called out by patient's family due to concerns of increasing weakness for the last 2 days. Patient lives with daughter and has home health and PT that come to her house. PT reported to family that patient was unable to get up from bed and ambulate like usual today. Patient had CVA in 01/2019, however reports no residual weakness. Patient alert and oriented x4 upon arrival.

## 2019-03-28 NOTE — ED Notes (Signed)
Stroke swallow screen failed, see chart. MD notified and orders to retry pt in 1 hour.

## 2019-03-28 NOTE — ED Notes (Signed)
Pt given blankets and resting quietly. Daughter has gone home.

## 2019-03-28 NOTE — ED Provider Notes (Signed)
[facility] Emergency Department Provider Note    First MD Initiated Contact with Patient 03/28/19 1500     (approximate)  I have reviewed the triage vital signs and the nursing notes.   HISTORY  Chief Complaint Weakness    HPI Christy Carter is a 85 y.o. female   with the below listed past medical history presents the ER for several days of progressing generalized weakness and difficulty walking.  Patient does have a history of CVA.  Had similar difficulty walking with previous stroke.  She does have home health PT.  States that she was putting wood in her wood-burning fire today and was having significant weakness which precipitated a fall.  Denies any chest pain.  No headache.  No blurry vision.  No numbness or tingling.  Denies any nausea or vomiting.  No chest pain or shortness of breath.  No dysuria or flank pain.   Past Medical History:  Diagnosis Date  . Arthritis   . Carpal tunnel syndrome   . Complication of anesthesia    Difficulty waking up. Believes it took 2 days to wake up after her surgery.  . Depression   . Hyperlipidemia   . Hypertension   . Shortness of breath   . Stroke ([place])   . Thyroid disease    Family History  Problem Relation Age of Onset  . Heart disease Mother   . Hypertension Mother   . Diverticulitis Mother   . Heart disease Father   . Hypertension Father    Past Surgical History:  Procedure Laterality Date  . APPENDECTOMY    . BACK SURGERY    . CHOLECYSTECTOMY    . HERNIA REPAIR    . IR CT HEAD LTD  02/05/2019  . IR PTA INTRACRANIAL  02/05/2019  . RADIOLOGY WITH ANESTHESIA N/A 02/05/2019   Procedure: CODE STROKE;  Surgeon: Radiologist, Medication, MD;  Location: [place];  Service: Radiology;  Laterality: N/A;   Patient Active Problem List   Diagnosis Date Noted  . Sleep disturbance   . Benign essential HTN   . Acute lower UTI   . Pseudodementia   . Labile blood pressure   . Metabolic encephalopathy   .  Hyperglycemia   . Encephalopathy   . AKI (acute kidney injury) ([place])   . Leukocytosis   . Dysphagia, post-stroke   . Acute bilat watershed infarction [facility]) 02/11/2019  . Agitation requiring sedation protocol 02/09/2019  . Dysphagia 02/09/2019  . Stroke (cerebrum) ([place]) - R watershed d/t large vessel dz & small L temp lobe & R cerebellar infarcts 02/05/2019  . Middle cerebral artery stenosis, right 02/05/2019  . Acute respiratory failure ([place]) 02/03/2019  . Acute respiratory failure with hypoxemia ([place]) 02/03/2019  . Abnormal renal function 01/19/2019  . Pain in left foot 12/19/2018  . Hematuria 08/02/2018  . Influenza A 03/02/2018  . Urinary tract infection without hematuria 03/02/2018  . Sepsis ([place]) 02/11/2018  . CAP (community acquired pneumonia) 02/11/2018  . CKD (chronic kidney disease), stage III 02/11/2018  . Memory loss, short term 02/10/2018  . Flu vaccine need 02/10/2018  . Chest pain 02/03/2018  . Acute upper respiratory infection 02/03/2018  . Gastroesophageal reflux disease without esophagitis 02/03/2018  . Encounter for general adult medical examination with abnormal findings 01/13/2018  . Chronic low back pain 01/13/2018  . Need for vaccination against Streptococcus pneumoniae using pneumococcal conjugate vaccine 7 01/13/2018  . Dysuria 01/13/2018  . Neoplasm of uncertain behavior of endometrium  10/08/2017  . Pelvic pain 10/08/2017  . Gout 06/15/2017  . Mixed hyperlipidemia 06/15/2017  . Inflammatory polyarthropathy ([place]) 06/15/2017  . Neuralgia and neuritis, unspecified 06/15/2017  . Major depressive disorder, recurrent, mild ([place]) 06/15/2017  . Other vitamin B12 deficiency anemias 06/15/2017  . Hypothyroidism 10/17/2008  . OVERWEIGHT/OBESITY 10/17/2008  . SLEEP APNEA, OBSTRUCTIVE 10/17/2008  . Essential hypertension 10/17/2008  . GALLSTONES 10/17/2008  . DYSPNEA 10/17/2008  . Obesity 10/17/2008      Prior to Admission medications   Medication Sig  Start Date End Date Taking? Authorizing Provider  acetaminophen (TYLENOL) 325 MG tablet Take 2 tablets (650 mg total) by mouth every 4 (four) hours as needed for mild pain (or temp > 37.5 C (99.5 F)). 03/08/19   Angiulli, Lavon Paganini, PA-C  allopurinol (ZYLOPRIM) 100 MG tablet Take 2 tabs at bed time for gout 01/12/19   Ronnell Freshwater, NP  aspirin EC 81 MG EC tablet Take 1 tablet (81 mg total) by mouth daily. 02/11/19   Donzetta Starch, NP  atorvastatin (LIPITOR) 40 MG tablet Take 1 tablet (40 mg total) by mouth daily at 6 PM. 03/08/19   Angiulli, Lavon Paganini, PA-C  bisoprolol (ZEBETA) 5 MG tablet Take 0.5 tablets (2.5 mg total) by mouth daily. 03/08/19   Angiulli, Lavon Paganini, PA-C  cloNIDine (CATAPRES - DOSED IN MG/24 HR) 0.1 mg/24hr patch Place 1 patch (0.1 mg total) onto the skin every Monday. 03/14/19   Angiulli, Lavon Paganini, PA-C  colchicine 0.6 MG tablet Take 1 tablet (0.6 mg total) by mouth 2 (two) times daily. 12/06/18   Ronnell Freshwater, NP  Cyanocobalamin (B-12) 2500 MCG TABS Take 2,500 mcg by mouth daily. As needed for lethargy    [provider]  diclofenac sodium (VOLTAREN) 1 % GEL Apply 4 g topically 4 (four) times daily. 03/08/19   Angiulli, Lavon Paganini, PA-C  FLUoxetine (PROZAC) 10 MG capsule Take 1 capsule (10 mg total) by mouth daily. 03/08/19   Angiulli, Lavon Paganini, PA-C  levothyroxine (SYNTHROID) 150 MCG tablet Take 1 tablet (150 mcg total) by mouth daily before breakfast. 03/08/19   Angiulli, Lavon Paganini, PA-C  omeprazole (PRILOSEC) 40 MG capsule Take 1 capsule (40 mg total) by mouth daily. 03/08/19   Angiulli, Lavon Paganini, PA-C  QUEtiapine (SEROQUEL) 25 MG tablet Take 1 tablet (25 mg total) by mouth at bedtime. 03/08/19   Angiulli, Lavon Paganini, PA-C  senna-docusate (SENOKOT-S) 8.6-50 MG tablet Take 1 tablet by mouth at bedtime. 02/11/19   Donzetta Starch, NP  ticagrelor (BRILINTA) 90 MG TABS tablet Take 1 tablet (90 mg total) by mouth 2 (two) times daily. 03/08/19   Angiulli, Lavon Paganini, PA-C   VENTOLIN HFA 108 (90 Base) MCG/ACT inhaler TAKE 2 PUFFS BY MOUTH EVERY 6 HOURS AS NEEDED FOR WHEEZE OR SHORTNESS OF BREATH 03/19/18   Ronnell Freshwater, NP  Vitamin D, Ergocalciferol, (DRISDOL) 1.25 MG (50000 UT) CAPS capsule Take 1 capsule (50,000 Units total) by mouth every 7 (seven) days. 02/02/19   Kendell Bane, NP  vitamin E 400 UNIT capsule Take 400 Units by mouth daily.    [provider]    Allergies Patient has no known allergies.    Social History Social History   Tobacco Use  . Smoking status: Former Smoker    Types: Cigarettes    Quit date: 08/15/1992    Years since quitting: 26.6  . Smokeless tobacco: Never Used  Substance Use Topics  . Alcohol use: No  .  Drug use: No    Review of Systems Patient denies headaches, rhinorrhea, blurry vision, numbness, shortness of breath, chest pain, edema, cough, abdominal pain, nausea, vomiting, diarrhea, dysuria, fevers, rashes or hallucinations unless otherwise stated above in HPI. ____________________________________________   PHYSICAL EXAM:  VITAL SIGNS: Vitals:   03/28/19 1443 03/28/19 1656  BP: (!) 134/41 (!) 150/45  Pulse: 73 83  Resp: (!) 24 20  Temp: 97.8 F (36.6 C)   SpO2: 97% 97%    Constitutional: Alert and oriented.  Eyes: Conjunctivae are normal.  Head: Atraumatic. Nose: No congestion/rhinnorhea. Mouth/Throat: Mucous membranes are moist.   Neck: No stridor. Painless ROM.  Cardiovascular: Normal rate, regular rhythm. Grossly normal heart sounds.  Good peripheral circulation. Respiratory: Normal respiratory effort.  No retractions. Lungs CTAB. Gastrointestinal: Soft and nontender. No distention. No abdominal bruits. No CVA tenderness. Genitourinary:  Musculoskeletal: No lower extremity tenderness nor edema.  No joint effusions. Neurologic:  CN- intact.  No facial droop, Normal FNF.  Normal heel to shin.  Sensation intact bilaterally. Normal speech and language. No gross focal neurologic  deficits are appreciated. 4/5 BLE strength with SLR against gravity Skin:  Skin is warm, dry and intact. No rash noted. Psychiatric: Mood and affect are normal. Speech and behavior are normal.  ____________________________________________   LABS (all labs ordered are listed, but only abnormal results are displayed)  Results for orders placed or performed during the hospital encounter of 03/28/19 (from the past 24 hour(s))  Comprehensive metabolic panel     Status: Abnormal   Collection Time: 03/28/19  2:57 PM  Result Value Ref Range   Sodium 140 135 - 145 mmol/L   Potassium 4.2 3.5 - 5.1 mmol/L   Chloride 103 98 - 111 mmol/L   CO2 25 22 - 32 mmol/L   Glucose, Bld 102 (H) 70 - 99 mg/dL   BUN 21 8 - 23 mg/dL   Creatinine, Ser 0.86 0.44 - 1.00 mg/dL   Calcium 9.6 8.9 - 10.3 mg/dL   Total Protein 7.2 6.5 - 8.1 g/dL   Albumin 3.7 3.5 - 5.0 g/dL   AST 21 15 - 41 U/L   ALT 10 0 - 44 U/L   Alkaline Phosphatase 97 38 - 126 U/L   Total Bilirubin 1.0 0.3 - 1.2 mg/dL   GFR calc non Af Amer >60 >60 mL/min   GFR calc Af Amer >60 >60 mL/min   Anion gap 12 5 - 15  CBC with Differential     Status: Abnormal   Collection Time: 03/28/19  2:57 PM  Result Value Ref Range   WBC 9.5 4.0 - 10.5 K/uL   RBC 4.26 3.87 - 5.11 MIL/uL   Hemoglobin 12.6 12.0 - 15.0 g/dL   HCT 40.1 36.0 - 46.0 %   MCV 94.1 80.0 - 100.0 fL   MCH 29.6 26.0 - 34.0 pg   MCHC 31.4 30.0 - 36.0 g/dL   RDW 15.9 (H) 11.5 - 15.5 %   Platelets 277 150 - 400 K/uL   nRBC 0.0 0.0 - 0.2 %   Neutrophils Relative % 72 %   Neutro Abs 6.9 1.7 - 7.7 K/uL   Lymphocytes Relative 11 %   Lymphs Abs 1.1 0.7 - 4.0 K/uL   Monocytes Relative 10 %   Monocytes Absolute 0.9 0.1 - 1.0 K/uL   Eosinophils Relative 5 %   Eosinophils Absolute 0.5 0.0 - 0.5 K/uL   Basophils Relative 1 %   Basophils Absolute 0.1 0.0 - 0.1 K/uL  Immature Granulocytes 1 %   Abs Immature Granulocytes 0.11 (H) 0.00 - 0.07 K/uL  Urinalysis, Complete w Microscopic      Status: Abnormal   Collection Time: 03/28/19  4:26 PM  Result Value Ref Range   Color, Urine YELLOW (A) YELLOW   APPearance CLEAR (A) CLEAR   Specific Gravity, Urine 1.016 1.005 - 1.030   pH 5.0 5.0 - 8.0   Glucose, UA NEGATIVE NEGATIVE mg/dL   Hgb urine dipstick MODERATE (A) NEGATIVE   Bilirubin Urine NEGATIVE NEGATIVE   Ketones, ur NEGATIVE NEGATIVE mg/dL   Protein, ur NEGATIVE NEGATIVE mg/dL   Nitrite NEGATIVE NEGATIVE   Leukocytes,Ua NEGATIVE NEGATIVE   RBC / HPF >50 (H) 0 - 5 RBC/hpf   WBC, UA 0-5 0 - 5 WBC/hpf   Bacteria, UA NONE SEEN NONE SEEN   Squamous Epithelial / LPF 0-5 0 - 5   ____________________________________________  EKG My review and personal interpretation at Time: 14:58   Indication: weakness  Rate: 70  Rhythm: sinus Axis: normal Other: normal intervals, no stemi ____________________________________________  RADIOLOGY  I personally reviewed all radiographic images ordered to evaluate for the above acute complaints and reviewed radiology reports and findings.  These findings were personally discussed with the patient.  Please see medical record for radiology report.  ____________________________________________   PROCEDURES  Procedure(s) performed:  Procedures    Critical Care performed: no ____________________________________________   INITIAL IMPRESSION / ASSESSMENT AND PLAN / ED COURSE  Pertinent labs & imaging results that were available during my care of the patient were reviewed by me and considered in my medical decision making (see chart for details).   DDX: Dehydration, sepsis, pna, uti, hypoglycemia, cva, drug effect,   Christy Carter is a 85 y.o. who presents to the ED with symptoms as described above.  Patient outside of the window for code stroke will order CT imaging for the by differential as well as blood work.  The patient will be placed on continuous pulse oximetry and telemetry for monitoring.  Laboratory evaluation will be sent  to evaluate for the above complaints.     Clinical Course as of Mar 28 1999  Mon Mar 28, 2019  1951 MRI with evidence of stroke.  Will give ASA and admit to hospitalist for further evaluation.   [PR]    Clinical Course User Index [PR] Merlyn Lot, MD    The patient was evaluated in Emergency Department today for the symptoms described in the history of present illness. He/she was evaluated in the context of the global COVID-19 pandemic, which necessitated consideration that the patient might be at risk for infection with the SARS-CoV-2 virus that causes COVID-19. Institutional protocols and algorithms that pertain to the evaluation of patients at risk for COVID-19 are in a state of rapid change based on information released by regulatory bodies including the CDC and federal and state organizations. These policies and algorithms were followed during the patient's care in the ED.  As part of my medical decision making, I reviewed the following data within the [place] notes reviewed and incorporated, Labs reviewed, notes from prior ED visits and [place] Controlled Substance Database   ____________________________________________   FINAL CLINICAL IMPRESSION(S) / ED DIAGNOSES  Final diagnoses:  Cerebrovascular accident (CVA), unspecified mechanism ([place])      NEW MEDICATIONS STARTED DURING THIS VISIT:  New Prescriptions   No medications on file     Note:  This document was prepared using Dragon voice  recognition software and may include unintentional dictation errors.    Merlyn Lot, MD 03/28/19 2000

## 2019-03-28 NOTE — ED Notes (Signed)
Patient transported to MRI 

## 2019-03-29 ENCOUNTER — Observation Stay: Payer: Medicare Other

## 2019-03-29 ENCOUNTER — Encounter: Payer: Self-pay | Admitting: Internal Medicine

## 2019-03-29 ENCOUNTER — Inpatient Hospital Stay (HOSPITAL_COMMUNITY)
Admission: EM | Admit: 2019-03-29 | Discharge: 2019-04-11 | DRG: 024 | Disposition: A | Discharge status: Skilled Nursing Facility | Payer: Medicare Other | Source: Other Acute Inpatient Hospital | Attending: Family Medicine | Admitting: Family Medicine

## 2019-03-29 ENCOUNTER — Telehealth: Payer: Self-pay

## 2019-03-29 DIAGNOSIS — I6621 Occlusion and stenosis of right posterior cerebral artery: Secondary | ICD-10-CM | POA: Diagnosis present

## 2019-03-29 DIAGNOSIS — Z79899 Other long term (current) drug therapy: Secondary | ICD-10-CM | POA: Diagnosis not present

## 2019-03-29 DIAGNOSIS — E039 Hypothyroidism, unspecified: Secondary | ICD-10-CM | POA: Diagnosis present

## 2019-03-29 DIAGNOSIS — R1310 Dysphagia, unspecified: Secondary | ICD-10-CM | POA: Diagnosis not present

## 2019-03-29 DIAGNOSIS — I69354 Hemiplegia and hemiparesis following cerebral infarction affecting left non-dominant side: Secondary | ICD-10-CM | POA: Diagnosis not present

## 2019-03-29 DIAGNOSIS — M6281 Muscle weakness (generalized): Secondary | ICD-10-CM | POA: Diagnosis not present

## 2019-03-29 DIAGNOSIS — Z9862 Peripheral vascular angioplasty status: Secondary | ICD-10-CM | POA: Diagnosis not present

## 2019-03-29 DIAGNOSIS — I69391 Dysphagia following cerebral infarction: Secondary | ICD-10-CM | POA: Diagnosis not present

## 2019-03-29 DIAGNOSIS — Z20828 Contact with and (suspected) exposure to other viral communicable diseases: Secondary | ICD-10-CM | POA: Diagnosis not present

## 2019-03-29 DIAGNOSIS — Z8249 Family history of ischemic heart disease and other diseases of the circulatory system: Secondary | ICD-10-CM

## 2019-03-29 DIAGNOSIS — Z8673 Personal history of transient ischemic attack (TIA), and cerebral infarction without residual deficits: Secondary | ICD-10-CM | POA: Diagnosis not present

## 2019-03-29 DIAGNOSIS — R52 Pain, unspecified: Secondary | ICD-10-CM | POA: Diagnosis not present

## 2019-03-29 DIAGNOSIS — R2681 Unsteadiness on feet: Secondary | ICD-10-CM | POA: Diagnosis present

## 2019-03-29 DIAGNOSIS — M1990 Unspecified osteoarthritis, unspecified site: Secondary | ICD-10-CM | POA: Diagnosis present

## 2019-03-29 DIAGNOSIS — M2550 Pain in unspecified joint: Secondary | ICD-10-CM | POA: Diagnosis not present

## 2019-03-29 DIAGNOSIS — I6381 Other cerebral infarction due to occlusion or stenosis of small artery: Secondary | ICD-10-CM | POA: Diagnosis not present

## 2019-03-29 DIAGNOSIS — Z9049 Acquired absence of other specified parts of digestive tract: Secondary | ICD-10-CM | POA: Diagnosis not present

## 2019-03-29 DIAGNOSIS — E782 Mixed hyperlipidemia: Secondary | ICD-10-CM | POA: Diagnosis present

## 2019-03-29 DIAGNOSIS — I771 Stricture of artery: Secondary | ICD-10-CM

## 2019-03-29 DIAGNOSIS — Z7982 Long term (current) use of aspirin: Secondary | ICD-10-CM

## 2019-03-29 DIAGNOSIS — I129 Hypertensive chronic kidney disease with stage 1 through stage 4 chronic kidney disease, or unspecified chronic kidney disease: Secondary | ICD-10-CM | POA: Diagnosis present

## 2019-03-29 DIAGNOSIS — E785 Hyperlipidemia, unspecified: Secondary | ICD-10-CM | POA: Diagnosis not present

## 2019-03-29 DIAGNOSIS — Z7401 Bed confinement status: Secondary | ICD-10-CM | POA: Diagnosis not present

## 2019-03-29 DIAGNOSIS — D519 Vitamin B12 deficiency anemia, unspecified: Secondary | ICD-10-CM | POA: Diagnosis present

## 2019-03-29 DIAGNOSIS — Z8701 Personal history of pneumonia (recurrent): Secondary | ICD-10-CM | POA: Diagnosis not present

## 2019-03-29 DIAGNOSIS — G9340 Encephalopathy, unspecified: Secondary | ICD-10-CM | POA: Diagnosis not present

## 2019-03-29 DIAGNOSIS — K219 Gastro-esophageal reflux disease without esophagitis: Secondary | ICD-10-CM | POA: Diagnosis present

## 2019-03-29 DIAGNOSIS — G9751 Postprocedural hemorrhage and hematoma of a nervous system organ or structure following a nervous system procedure: Secondary | ICD-10-CM | POA: Diagnosis not present

## 2019-03-29 DIAGNOSIS — Z8619 Personal history of other infectious and parasitic diseases: Secondary | ICD-10-CM | POA: Diagnosis not present

## 2019-03-29 DIAGNOSIS — D518 Other vitamin B12 deficiency anemias: Secondary | ICD-10-CM | POA: Diagnosis not present

## 2019-03-29 DIAGNOSIS — R531 Weakness: Secondary | ICD-10-CM | POA: Diagnosis present

## 2019-03-29 DIAGNOSIS — I10 Essential (primary) hypertension: Secondary | ICD-10-CM | POA: Diagnosis not present

## 2019-03-29 DIAGNOSIS — Z7902 Long term (current) use of antithrombotics/antiplatelets: Secondary | ICD-10-CM | POA: Diagnosis not present

## 2019-03-29 DIAGNOSIS — Z87440 Personal history of urinary (tract) infections: Secondary | ICD-10-CM | POA: Diagnosis not present

## 2019-03-29 DIAGNOSIS — Z7951 Long term (current) use of inhaled steroids: Secondary | ICD-10-CM | POA: Diagnosis not present

## 2019-03-29 DIAGNOSIS — N183 Chronic kidney disease, stage 3 unspecified: Secondary | ICD-10-CM | POA: Diagnosis present

## 2019-03-29 DIAGNOSIS — M545 Low back pain: Secondary | ICD-10-CM | POA: Diagnosis present

## 2019-03-29 DIAGNOSIS — Z743 Need for continuous supervision: Secondary | ICD-10-CM | POA: Diagnosis not present

## 2019-03-29 DIAGNOSIS — Z79890 Hormone replacement therapy (postmenopausal): Secondary | ICD-10-CM

## 2019-03-29 DIAGNOSIS — E569 Vitamin deficiency, unspecified: Secondary | ICD-10-CM | POA: Diagnosis not present

## 2019-03-29 DIAGNOSIS — I6601 Occlusion and stenosis of right middle cerebral artery: Secondary | ICD-10-CM | POA: Diagnosis present

## 2019-03-29 DIAGNOSIS — R29701 NIHSS score 1: Secondary | ICD-10-CM | POA: Diagnosis present

## 2019-03-29 DIAGNOSIS — G479 Sleep disorder, unspecified: Secondary | ICD-10-CM

## 2019-03-29 DIAGNOSIS — I639 Cerebral infarction, unspecified: Secondary | ICD-10-CM | POA: Diagnosis not present

## 2019-03-29 DIAGNOSIS — G8929 Other chronic pain: Secondary | ICD-10-CM | POA: Diagnosis present

## 2019-03-29 DIAGNOSIS — I6350 Cerebral infarction due to unspecified occlusion or stenosis of unspecified cerebral artery: Secondary | ICD-10-CM | POA: Diagnosis not present

## 2019-03-29 DIAGNOSIS — I6612 Occlusion and stenosis of left anterior cerebral artery: Secondary | ICD-10-CM | POA: Diagnosis not present

## 2019-03-29 DIAGNOSIS — Z8379 Family history of other diseases of the digestive system: Secondary | ICD-10-CM | POA: Diagnosis not present

## 2019-03-29 DIAGNOSIS — G4733 Obstructive sleep apnea (adult) (pediatric): Secondary | ICD-10-CM | POA: Diagnosis present

## 2019-03-29 DIAGNOSIS — I6389 Other cerebral infarction: Secondary | ICD-10-CM | POA: Diagnosis not present

## 2019-03-29 DIAGNOSIS — F329 Major depressive disorder, single episode, unspecified: Secondary | ICD-10-CM | POA: Diagnosis not present

## 2019-03-29 DIAGNOSIS — M109 Gout, unspecified: Secondary | ICD-10-CM | POA: Diagnosis present

## 2019-03-29 DIAGNOSIS — I63511 Cerebral infarction due to unspecified occlusion or stenosis of right middle cerebral artery: Secondary | ICD-10-CM | POA: Diagnosis not present

## 2019-03-29 DIAGNOSIS — Z87891 Personal history of nicotine dependence: Secondary | ICD-10-CM

## 2019-03-29 DIAGNOSIS — M064 Inflammatory polyarthropathy: Secondary | ICD-10-CM | POA: Diagnosis present

## 2019-03-29 DIAGNOSIS — F339 Major depressive disorder, recurrent, unspecified: Secondary | ICD-10-CM | POA: Diagnosis present

## 2019-03-29 DIAGNOSIS — R262 Difficulty in walking, not elsewhere classified: Secondary | ICD-10-CM | POA: Diagnosis not present

## 2019-03-29 DIAGNOSIS — I69328 Other speech and language deficits following cerebral infarction: Secondary | ICD-10-CM | POA: Diagnosis not present

## 2019-03-29 DIAGNOSIS — M1000 Idiopathic gout, unspecified site: Secondary | ICD-10-CM | POA: Diagnosis not present

## 2019-03-29 LAB — COMPREHENSIVE METABOLIC PANEL
ALT: 8 U/L (ref 0–44)
AST: 12 U/L — ABNORMAL LOW (ref 15–41)
Albumin: 3.3 g/dL — ABNORMAL LOW (ref 3.5–5.0)
Alkaline Phosphatase: 79 U/L (ref 38–126)
Anion gap: 8 (ref 5–15)
BUN: 17 mg/dL (ref 8–23)
CO2: 26 mmol/L (ref 22–32)
Calcium: 9.1 mg/dL (ref 8.9–10.3)
Chloride: 105 mmol/L (ref 98–111)
Creatinine, Ser: 0.71 mg/dL (ref 0.44–1.00)
GFR calc Af Amer: >60 mL/min (ref >60)
GFR calc non Af Amer: >60 mL/min (ref >60)
Glucose, Bld: 104 mg/dL — ABNORMAL HIGH (ref 70–99)
Potassium: 3.8 mmol/L (ref 3.5–5.1)
Sodium: 139 mmol/L (ref 135–145)
Total Bilirubin: 0.8 mg/dL (ref 0.3–1.2)
Total Protein: 6.4 g/dL — ABNORMAL LOW (ref 6.5–8.1)

## 2019-03-29 LAB — LIPID PANEL
Cholesterol: 132 mg/dL (ref 0–200)
HDL: 41 mg/dL (ref >40)
LDL Cholesterol: 66 mg/dL (ref 0–99)
Total CHOL/HDL Ratio: 3.2 RATIO
Triglycerides: 127 mg/dL (ref <150)
VLDL: 25 mg/dL (ref 0–40)

## 2019-03-29 LAB — CBC
HCT: 37.2 % (ref 36.0–46.0)
HCT: 39.4 % (ref 36.0–46.0)
Hemoglobin: 11.7 g/dL — ABNORMAL LOW (ref 12.0–15.0)
Hemoglobin: 13 g/dL (ref 12.0–15.0)
MCH: 29.5 pg (ref 26.0–34.0)
MCH: 30.4 pg (ref 26.0–34.0)
MCHC: 31.5 g/dL (ref 30.0–36.0)
MCHC: 33 g/dL (ref 30.0–36.0)
MCV: 93.7 fL (ref 80.0–100.0)
MCV: 92.1 fL (ref 80.0–100.0)
Platelets: 225 10*3/uL (ref 150–400)
Platelets: 227 10*3/uL (ref 150–400)
RBC: 3.97 MIL/uL (ref 3.87–5.11)
RBC: 4.28 MIL/uL (ref 3.87–5.11)
RDW: 15.5 % (ref 11.5–15.5)
RDW: 15.5 % (ref 11.5–15.5)
WBC: 7.5 10*3/uL (ref 4.0–10.5)
WBC: 8.1 10*3/uL (ref 4.0–10.5)
nRBC: 0 % (ref 0.0–0.2)
nRBC: 0 % (ref 0.0–0.2)

## 2019-03-29 LAB — SARS CORONAVIRUS 2 (TAT 6-24 HRS): SARS Coronavirus 2: NEGATIVE

## 2019-03-29 LAB — HEMOGLOBIN A1C
Hgb A1c MFr Bld: 5.5 % (ref 4.8–5.6)
Mean Plasma Glucose: 111.15 mg/dL

## 2019-03-29 LAB — CREATININE, SERUM
Creatinine, Ser: 0.77 mg/dL (ref 0.44–1.00)
GFR calc Af Amer: >60 mL/min (ref >60)
GFR calc non Af Amer: >60 mL/min (ref >60)

## 2019-03-29 MED ORDER — ATORVASTATIN CALCIUM 40 MG PO TABS
40.0000 mg | ORAL_TABLET | Freq: Every day | ORAL | Status: DC
  Administered 2019-03-30 – 2019-04-10 (×11): 40 mg via ORAL
  Filled 2019-03-29 (×13): qty 1

## 2019-03-29 MED ORDER — SODIUM CHLORIDE 0.9 % IV BOLUS: 500.0000 mL | Freq: Once | INTRAVENOUS | Status: DC

## 2019-03-29 MED ORDER — PANTOPRAZOLE SODIUM 40 MG PO TBEC
40.0000 mg | DELAYED_RELEASE_TABLET | Freq: Every day | ORAL | Status: DC
  Administered 2019-03-29 – 2019-04-11 (×13): 40 mg via ORAL
  Filled 2019-03-29 (×13): qty 1

## 2019-03-29 MED ORDER — ACETAMINOPHEN 650 MG RE SUPP: 650.0000 mg | RECTAL | Status: DC | PRN

## 2019-03-29 MED ORDER — ASPIRIN 81 MG PO CHEW
81.0000 mg | CHEWABLE_TABLET | Freq: Every day | ORAL | Status: DC
  Administered 2019-03-29 – 2019-04-06 (×9): 81 mg via ORAL
  Filled 2019-03-29 (×9): qty 1

## 2019-03-29 MED ORDER — QUETIAPINE FUMARATE 25 MG PO TABS
25.0000 mg | ORAL_TABLET | Freq: Every day | ORAL | Status: DC
  Administered 2019-03-29 – 2019-04-10 (×12): 25 mg via ORAL
  Filled 2019-03-29 (×13): qty 1

## 2019-03-29 MED ORDER — LEVOTHYROXINE SODIUM 75 MCG PO TABS
150.0000 ug | ORAL_TABLET | Freq: Every day | ORAL | Status: DC
  Administered 2019-03-30 – 2019-04-11 (×10): 150 ug via ORAL
  Filled 2019-03-29 (×10): qty 2

## 2019-03-29 MED ORDER — SODIUM CHLORIDE 0.9 % IV BOLUS
1000.0000 mL | Freq: Once | INTRAVENOUS | Status: AC
  Administered 2019-03-29: 1000 mL via INTRAVENOUS

## 2019-03-29 MED ORDER — FLUOXETINE HCL 10 MG PO CAPS
10.0000 mg | ORAL_CAPSULE | Freq: Every day | ORAL | Status: DC
  Administered 2019-03-29 – 2019-04-11 (×13): 10 mg via ORAL
  Filled 2019-03-29 (×14): qty 1

## 2019-03-29 MED ORDER — STROKE: EARLY STAGES OF RECOVERY BOOK
Freq: Once | Status: AC
  Administered 2019-03-29: 19:00:00
  Filled 2019-03-29: qty 1

## 2019-03-29 MED ORDER — SODIUM CHLORIDE 0.9 % IV SOLN
INTRAVENOUS | Status: DC
  Administered 2019-03-29 – 2019-03-30 (×2): via INTRAVENOUS

## 2019-03-29 MED ORDER — ACETAMINOPHEN 160 MG/5ML PO SOLN: 650.0000 mg | ORAL | Status: DC | PRN

## 2019-03-29 MED ORDER — ALLOPURINOL 100 MG PO TABS
100.0000 mg | ORAL_TABLET | Freq: Every day | ORAL | Status: DC
  Administered 2019-03-29 – 2019-04-10 (×13): 100 mg via ORAL
  Filled 2019-03-29 (×15): qty 1

## 2019-03-29 MED ORDER — ENOXAPARIN SODIUM 40 MG/0.4ML ~~LOC~~ SOLN
40.0000 mg | SUBCUTANEOUS | Status: DC
  Administered 2019-03-29 – 2019-04-03 (×6): 40 mg via SUBCUTANEOUS
  Filled 2019-03-29 (×6): qty 0.4

## 2019-03-29 MED ORDER — TICAGRELOR 90 MG PO TABS
90.0000 mg | ORAL_TABLET | Freq: Two times a day (BID) | ORAL | Status: DC
  Administered 2019-03-29 – 2019-04-06 (×16): 90 mg via ORAL
  Filled 2019-03-29 (×16): qty 1

## 2019-03-29 MED ORDER — ACETAMINOPHEN 325 MG PO TABS
650.0000 mg | ORAL_TABLET | ORAL | Status: DC | PRN
  Administered 2019-03-31 – 2019-04-05 (×4): 650 mg via ORAL
  Filled 2019-03-29 (×4): qty 2

## 2019-03-29 NOTE — Progress Notes (Signed)
Patient arrived to 3w19 from [place]. Alert and oriented x4 but forgetful. Skin intact, ecchymosis on buttock and lower extremities. Bed in lowest position, call light on. Nurse will continue to monitor. [place]

## 2019-03-29 NOTE — Progress Notes (Signed)
PT Cancellation Note  Patient Details Name: Christy Carter MRN: [id number] DOB: 03-13-1934   Cancelled Treatment:    Reason Eval/Treat Not Completed: Medical issues which prohibited therapy(Per chart review patient to be transferred to [facility] for endovascular evaluation to assess if she would be a candidate for a repeat angioplasty versus stent.) PT will sign off at this time and await updated PT orders once patient is medically ready for evaluation.   8:00 AM, 03/29/19 Etta Grandchild, PT, DPT Physical Therapist - [facility]  [phone] ([place])     Anela Bensman C 03/29/2019, 7:59 AM

## 2019-03-29 NOTE — Plan of Care (Signed)
Called by Dr. Hal Hope at [facility] regarding this patient. Right MCA stroke in October-status post right M1 angioplasty with worsening left-sided weakness over the past 1 week. MRI brain with a small punctate area of restricted diffusion in the right MCA territory. I recommended obtaining vessel imaging-MRA head and neck were completed which shows a recurrent critical stenosis/near occlusion of the right M1 with patent branches past the occlusion. I recommended the patient be transferred to [facility] for endovascular evaluation to assess if she would be a candidate for a repeat angioplasty versus stent. Dr. Hal Hope will initiate the transfer to [facility]. Please notify neuro hospitalist service when the patient arrives at [facility].   -- Amie Portland, MD [facility] Pager: [phone] If 7pm to 7am, please call on call as listed on AMION.

## 2019-03-29 NOTE — Discharge Summary (Signed)
Physician Discharge Summary  Christy Carter [id number] DOB: 06/01/1933 DOA: 03/28/2019  PCP: Christy Guise, MD  Admit date: 03/28/2019 Discharge date: 03/29/2019  Discharge disposition: Transfer to Christy Carter   Recommendations for Outpatient Follow-Up:   Outpatient follow-up with PCP and neurologist when discharged from the hospital.   Discharge Diagnosis:   Principal Problem:   Acute CVA (cerebrovascular accident) [facility]) Active Problems:   Hypothyroidism   Essential hypertension   CKD (chronic kidney disease), stage III   Sleep disturbance   CVA (cerebral vascular accident) [facility])    Discharge Condition: Stable.  Diet recommendation: Low-salt diet  Code status: Full code    Hospital Course:   [name] is an 85 year old woman with medical history significant for hypertension, CKD, OSA, gout, recent stroke in September 2020 and intervention with angioplasty.  She was brought to the hospital because of increasing confusion, difficulty walking and unsteady gait and increasing left-sided weakness. She was found to have acute stroke.  MRI brain showed small acute lacunar infarct in the subcortical white matter of the right middle frontal gyrus.  MRA head and neck also showed recurrent critical stenosis/near occlusion of the right MCA M1 segment which was angioplastied in September 2020.  The admitting physician, Dr. Hal Carter, had discussed the case with neurologist, Dr. Demetra Carter, recommended that patient be transferred to Christy Carter for further management.  Accepting physician is Dr. Christia Reading Carter.  Patient and her family are agreeable to transfer and to be transferred to Christy Carter for further management.   Medical Consultants:    Neurologist   Discharge Exam:   Vitals:   03/29/19 1506 03/29/19 1538  BP: (!) 164/60 (!) 164/60  Pulse: 79 80  Resp: (!) 22 19  Temp: (!) 97.4 F (36.3 C) (!) 97.4 F (36.3 C)  SpO2: 94% 95%    Vitals:   03/29/19 1134 03/29/19 1503 03/29/19 1506 03/29/19 1538  BP: (!) 159/61 (!) 164/60 (!) 164/60 (!) 164/60  Pulse: 69 76 79 80  Resp: (!) 24 20 (!) 22 19  Temp:   (!) 97.4 F (36.3 C) (!) 97.4 F (36.3 C)  TempSrc:      SpO2: 97%  94% 95%  Weight:      Height:         GEN: NAD SKIN: No rash EYES: EOMI, PERRLA, no nystagmus ENT: MMM CV: RRR PULM: CTA B ABD: soft, ND, NT, +BS CNS: AAO x 3, no facial palsy, she is able to move all 4 extremities spontaneously EXT: No edema or tenderness   The results of significant diagnostics from this hospitalization (including imaging, microbiology, ancillary and laboratory) are listed below for reference.     Procedures and Diagnostic Studies:   Dg Chest 2 View  Result Date: 03/28/2019 CLINICAL DATA:  Generalized weakness. EXAM: CHEST - 2 VIEW COMPARISON:  02/17/2019 and CT chest 10/13/2018. FINDINGS: Patient is rotated. Heart is enlarged, stable. Thoracic aorta is calcified. Mild bibasilar streaky opacification. No dense airspace consolidation. Tiny left pleural effusion. Left hemidiaphragm is elevated. IMPRESSION: 1. Streaky bibasilar airspace opacification may be due to atelectasis. A viral or atypical pneumonia cannot be excluded. 2. Tiny left pleural effusion. 3.  Aortic atherosclerosis (ICD10-170.0). Electronically Signed   By: Christy Carter M.D.   On: 03/28/2019 15:38   Ct Head Wo Contrast  Result Date: 03/28/2019 CLINICAL DATA:  Worsening weakness over the last 2 days. EXAM: CT HEAD WITHOUT CONTRAST CT CERVICAL SPINE WITHOUT CONTRAST TECHNIQUE: Multidetector  CT imaging of the head and cervical spine was performed following the standard protocol without intravenous contrast. Multiplanar CT image reconstructions of the cervical spine were also generated. COMPARISON:  MRI 02/07/2019.  CT 02/05/2019 FINDINGS: CT HEAD FINDINGS Brain: Chronic atrophy and small-vessel ischemic change of the white matter. No sign of acute  infarction, mass lesion, hemorrhage, obstructive hydrocephalus or extra-axial collection. Ventriculomegaly secondary to central atrophy. Vascular: There is atherosclerotic calcification of the major vessels at the base of the brain. Skull: Negative Sinuses/Orbits: Clear/normal Other: None CT CERVICAL SPINE FINDINGS Alignment: Normal Skull base and vertebrae: No fracture or primary bone lesion. Soft tissues and spinal canal: Negative Disc levels: Degenerative spondylosis from C3-4 through C7-T1 with disc space narrowing and small osteophytes. Facet osteoarthritis on the right at C3-4 and C4-5 and on the left at C4-5. Mild foraminal narrowing on the right at C3-4 and C4-5. No traumatic finding. Upper chest: Negative Other: None IMPRESSION: Head CT: No acute or traumatic finding. Atrophy and chronic small-vessel ischemic changes. Cervical spine CT: No acute or traumatic finding. Degenerative spondylosis and facet osteoarthritis. Electronically Signed   By: Christy Carter M.D.   On: 03/28/2019 15:47   Ct Cervical Spine Wo Contrast  Result Date: 03/28/2019 CLINICAL DATA:  Worsening weakness over the last 2 days. EXAM: CT HEAD WITHOUT CONTRAST CT CERVICAL SPINE WITHOUT CONTRAST TECHNIQUE: Multidetector CT imaging of the head and cervical spine was performed following the standard protocol without intravenous contrast. Multiplanar CT image reconstructions of the cervical spine were also generated. COMPARISON:  MRI 02/07/2019.  CT 02/05/2019 FINDINGS: CT HEAD FINDINGS Brain: Chronic atrophy and small-vessel ischemic change of the white matter. No sign of acute infarction, mass lesion, hemorrhage, obstructive hydrocephalus or extra-axial collection. Ventriculomegaly secondary to central atrophy. Vascular: There is atherosclerotic calcification of the major vessels at the base of the brain. Skull: Negative Sinuses/Orbits: Clear/normal Other: None CT CERVICAL SPINE FINDINGS Alignment: Normal Skull base and vertebrae: No  fracture or primary bone lesion. Soft tissues and spinal canal: Negative Disc levels: Degenerative spondylosis from C3-4 through C7-T1 with disc space narrowing and small osteophytes. Facet osteoarthritis on the right at C3-4 and C4-5 and on the left at C4-5. Mild foraminal narrowing on the right at C3-4 and C4-5. No traumatic finding. Upper chest: Negative Other: None IMPRESSION: Head CT: No acute or traumatic finding. Atrophy and chronic small-vessel ischemic changes. Cervical spine CT: No acute or traumatic finding. Degenerative spondylosis and facet osteoarthritis. Electronically Signed   By: Christy Carter M.D.   On: 03/28/2019 15:47   Mr Angio Head Wo Contrast  Result Date: 03/29/2019 CLINICAL DATA:  85 year old female with increasing weakness, small acute right middle frontal gyrus infarct on brain MRI yesterday. History of multiple intracranial stenoses. Status post balloon angioplasty of critical right MCA stenosis on 02/05/2019. EXAM: MRA HEAD WITHOUT CONTRAST MRA NECK WITHOUT CONTRAST TECHNIQUE: Angiographic images of the Circle of Willis were obtained using MRA technique without intravenous contrast. Angiographic images of the neck were obtained using MRA technique without intravenous contrast. Carotid stenosis measurements (when applicable) are obtained utilizing NASCET criteria, using the distal internal carotid diameter as the denominator. COMPARISON:  Brain MRI 03/28/2019. Intracranial MRA 02/07/2019, CTA head and neck 02/05/2019, and earlier. FINDINGS: MRA NECK FINDINGS 3D time-of-flight images demonstrate antegrade flow signal in the bilateral cervical carotid arteries and the cervical left vertebral artery to the skull base. Absent flow signal in the cervical right vertebral artery compatible with known right Vertebral artery occlusion. Mild irregularity at both  carotid bifurcations appears stable to that on the 02/05/2019 CTA and does not appear hemodynamically significant. No left vertebral  artery stenosis identified in the neck. MRA HEAD FINDINGS Antegrade flow in both ICA siphons appears stable since 02/07/2019, with mild stenosis on the left. Patent ophthalmic and right posterior communicating artery origins. Patent carotid termini, MCA and ACA origins. A 5-6 millimeter segment of lost flow signal has developed in the proximal right MCA M1 (series 9, image 8), where moderate right MCA irregularity and stenosis was present on the post angioplasty MRA. However, flow is reconstituted at the right MCA bifurcation, and the visible right MCA branches appear to remain patent. Left MCA M1 segment, left MCA trifurcation and left MCA branches appear stable and within normal limits. Chronic occlusion of the left ACA A1. The right A1, anterior communicating artery and visible ACA branches appears stable. Chronic poor flow in the distal right vertebral artery, probably partially reconstituted in a retrograde fashion. Stable antegrade flow signal in the distal left vertebral artery to the vertebrobasilar junction. Stable basilar artery with mild irregularity and mild proximal stenosis. Patent basilar tip, SCA and PCA origins. Right posterior communicating artery is present, the left is diminutive or absent. Bilateral PCA branches appear stable with moderate to severe right P2 segment stenosis. IMPRESSION: 1. Recurrent Critical Stenosis/Near Occlusion of the Right MCA M1 segment which was angioplastied in September. Reconstituted right MCA bifurcation, with no right MCA branch occlusion identified. 2. Otherwise stable intracranial MRA including chronic left A1 occlusion, poor flow in the distal right vertebral artery, and severe right PCA stenosis. 3. Neck MRA redemonstrates chronic right Vertebral artery occlusion, with no significant left vertebral or cervical carotid artery stenosis. Salient findings discussed by telephone with PA Rufina Falco on 03/29/2019 at 0242 hours. Electronically Signed   By: Genevie Ann  M.D.   On: 03/29/2019 02:48   Mr Angio Neck Wo Contrast  Result Date: 03/29/2019 CLINICAL DATA:  85 year old female with increasing weakness, small acute right middle frontal gyrus infarct on brain MRI yesterday. History of multiple intracranial stenoses. Status post balloon angioplasty of critical right MCA stenosis on 02/05/2019. EXAM: MRA HEAD WITHOUT CONTRAST MRA NECK WITHOUT CONTRAST TECHNIQUE: Angiographic images of the Circle of Willis were obtained using MRA technique without intravenous contrast. Angiographic images of the neck were obtained using MRA technique without intravenous contrast. Carotid stenosis measurements (when applicable) are obtained utilizing NASCET criteria, using the distal internal carotid diameter as the denominator. COMPARISON:  Brain MRI 03/28/2019. Intracranial MRA 02/07/2019, CTA head and neck 02/05/2019, and earlier. FINDINGS: MRA NECK FINDINGS 3D time-of-flight images demonstrate antegrade flow signal in the bilateral cervical carotid arteries and the cervical left vertebral artery to the skull base. Absent flow signal in the cervical right vertebral artery compatible with known right Vertebral artery occlusion. Mild irregularity at both carotid bifurcations appears stable to that on the 02/05/2019 CTA and does not appear hemodynamically significant. No left vertebral artery stenosis identified in the neck. MRA HEAD FINDINGS Antegrade flow in both ICA siphons appears stable since 02/07/2019, with mild stenosis on the left. Patent ophthalmic and right posterior communicating artery origins. Patent carotid termini, MCA and ACA origins. A 5-6 millimeter segment of lost flow signal has developed in the proximal right MCA M1 (series 9, image 8), where moderate right MCA irregularity and stenosis was present on the post angioplasty MRA. However, flow is reconstituted at the right MCA bifurcation, and the visible right MCA branches appear to remain patent. Left MCA M1  segment, left  MCA trifurcation and left MCA branches appear stable and within normal limits. Chronic occlusion of the left ACA A1. The right A1, anterior communicating artery and visible ACA branches appears stable. Chronic poor flow in the distal right vertebral artery, probably partially reconstituted in a retrograde fashion. Stable antegrade flow signal in the distal left vertebral artery to the vertebrobasilar junction. Stable basilar artery with mild irregularity and mild proximal stenosis. Patent basilar tip, SCA and PCA origins. Right posterior communicating artery is present, the left is diminutive or absent. Bilateral PCA branches appear stable with moderate to severe right P2 segment stenosis. IMPRESSION: 1. Recurrent Critical Stenosis/Near Occlusion of the Right MCA M1 segment which was angioplastied in September. Reconstituted right MCA bifurcation, with no right MCA branch occlusion identified. 2. Otherwise stable intracranial MRA including chronic left A1 occlusion, poor flow in the distal right vertebral artery, and severe right PCA stenosis. 3. Neck MRA redemonstrates chronic right Vertebral artery occlusion, with no significant left vertebral or cervical carotid artery stenosis. Salient findings discussed by telephone with PA Rufina Falco on 03/29/2019 at 0242 hours. Electronically Signed   By: Genevie Ann M.D.   On: 03/29/2019 02:48   Mr Brain Wo Contrast  Result Date: 03/28/2019 CLINICAL DATA:  85 year old female with increasing weakness for 2 days. Status post stroke in September. History of multiple severe intracranial stenoses EXAM: MRI HEAD WITHOUT CONTRAST TECHNIQUE: Multiplanar, multiecho pulse sequences of the brain and surrounding structures were obtained without intravenous contrast. COMPARISON:  Brain MRI and intracranial MRA 02/07/2019 and earlier. FINDINGS: Brain: Expected evolution of the right corona radiata infarct since September. Small new 4-5 millimeter Carter of right middle frontal gyrus  subcortical white matter restricted diffusion on series 9, image 34 and series 22, image 21. T2 and FLAIR hyperintensity with no hemorrhage or mass effect. Developing cystic encephalomalacia in the right corona radiata. No cortical encephalomalacia identified. Small chronic infarcts in the right PICA territory. Patchy and confluent bilateral periventricular white matter T2 and FLAIR hyperintensity. Chronic microhemorrhages in the right periatrial white matter. Mild superficial siderosis along the left lateral occipital lobe is also stable on series 17, image 24. No midline shift, mass effect, evidence of mass lesion, ventriculomegaly, extra-axial collection or acute intracranial hemorrhage. Cervicomedullary junction and pituitary are within normal limits. Vascular: Major intracranial vascular flow voids are stable, with evidence of decreased flow in the distal right vertebral artery. Skull and upper cervical spine: Negative visible cervical spine. Visualized bone marrow signal is within normal limits. Sinuses/Orbits: Stable and negative. Other: Mastoids remain clear. Scalp and face soft tissues appear negative. IMPRESSION: 1. Small acute lacunar infarct in subcortical white matter of the right middle frontal gyrus. No associated hemorrhage or mass effect. 2. Expected evolution of the right corona radiata infarct since September. Otherwise stable chronic small vessel disease. Mild left occipital lobe superficial siderosis. 3. Chronically abnormal flow in the distal right vertebral artery. Electronically Signed   By: Genevie Ann M.D.   On: 03/28/2019 19:40     Labs:   Basic Metabolic Panel: Recent Labs  Lab 03/28/19 1457 03/29/19 0806  NA 140 139  K 4.2 3.8  CL 103 105  CO2 25 26  GLUCOSE 102* 104*  BUN 21 17  CREATININE 0.86 0.71  CALCIUM 9.6 9.1   GFR Estimated Creatinine Clearance: 59.5 mL/min (by C-G formula based on SCr of 0.71 mg/dL). Liver Function Tests: Recent Labs  Lab 03/28/19 1457  03/29/19 0806  AST 21 12*  ALT  10 8  ALKPHOS 97 79  BILITOT 1.0 0.8  PROT 7.2 6.4*  ALBUMIN 3.7 3.3*   No results for input(s): LIPASE, AMYLASE in the last 168 hours. No results for input(s): AMMONIA in the last 168 hours. Coagulation profile Recent Labs  Lab 03/28/19 2114  INR 1.0    CBC: Recent Labs  Lab 03/28/19 1457 03/29/19 0806  WBC 9.5 7.5  NEUTROABS 6.9  --   HGB 12.6 11.7*  HCT 40.1 37.2  MCV 94.1 93.7  PLT 277 225   Cardiac Enzymes: No results for input(s): CKTOTAL, CKMB, CKMBINDEX, TROPONINI in the last 168 hours. BNP: Invalid input(s): POCBNP CBG: No results for input(s): GLUCAP in the last 168 hours. D-Dimer No results for input(s): DDIMER in the last 72 hours. Hgb A1c Recent Labs    03/29/19 0806  HGBA1C 5.5   Lipid Profile Recent Labs    03/29/19 0806  CHOL 132  HDL 41  LDLCALC 66  TRIG 127  CHOLHDL 3.2   Thyroid function studies No results for input(s): TSH, T4TOTAL, T3FREE, THYROIDAB in the last 72 hours.  Invalid input(s): FREET3 Anemia work up No results for input(s): VITAMINB12, FOLATE, FERRITIN, TIBC, IRON, RETICCTPCT in the last 72 hours. Microbiology Recent Results (from the past 240 hour(s))  SARS CORONAVIRUS 2 (TAT 6-24 HRS) Nasopharyngeal Nasopharyngeal Swab     Status: None   Collection Time: 03/28/19  9:14 PM   Specimen: Nasopharyngeal Swab  Result Value Ref Range Status   SARS Coronavirus 2 NEGATIVE NEGATIVE Final    Comment: (NOTE) SARS-CoV-2 target nucleic acids are NOT DETECTED. The SARS-CoV-2 RNA is generally detectable in upper and lower respiratory specimens during the acute phase of infection. Negative results do not preclude SARS-CoV-2 infection, do not rule out co-infections with other pathogens, and should not be used as the sole basis for treatment or other patient management decisions. Negative results must be combined with clinical observations, patient history, and epidemiological information. The  expected result is Negative. Fact Sheet for Patients: SugarRoll.be Fact Sheet for Healthcare Providers: https://www.woods-mathews.com/ This test is not yet approved or cleared by the [place] FDA and  has been authorized for detection and/or diagnosis of SARS-CoV-2 by FDA under an Emergency Use Authorization (EUA). This EUA will remain  in effect (meaning this test can be used) for the duration of the COVID-19 declaration under Section 56 4(b)(1) of the Act, 21 U.S.C. section 360bbb-3(b)(1), unless the authorization is terminated or revoked sooner. Performed at [facility], [place] [address], [place], [place] [address]      Discharge Instructions:    Allergies as of 03/29/2019   No Known Allergies     Medication List    TAKE these medications   acetaminophen 325 MG tablet Commonly known as: TYLENOL Take 2 tablets (650 mg total) by mouth every 4 (four) hours as needed for mild pain (or temp > 37.5 C (99.5 F)).   allopurinol 100 MG tablet Commonly known as: ZYLOPRIM Take 2 tabs at bed time for gout   aspirin 81 MG EC tablet Take 1 tablet (81 mg total) by mouth daily.   atorvastatin 40 MG tablet Commonly known as: LIPITOR Take 1 tablet (40 mg total) by mouth daily at 6 PM.   B-12 2500 MCG Tabs Take 2,500 mcg by mouth daily. As needed for lethargy   bisoprolol 5 MG tablet Commonly known as: ZEBETA Take 0.5 tablets (2.5 mg total) by mouth daily.   cloNIDine 0.1 mg/24hr patch Commonly  known as: CATAPRES - Dosed in mg/24 hr Place 1 patch (0.1 mg total) onto the skin every Monday.   colchicine 0.6 MG tablet Take 1 tablet (0.6 mg total) by mouth 2 (two) times daily.   diclofenac sodium 1 % Gel Commonly known as: VOLTAREN Apply 4 g topically 4 (four) times daily. What changed:   when to take this  reasons to take this   FLUoxetine 10 MG capsule Commonly known as: PROZAC Take 1 capsule (10 mg total) by mouth  daily.   levothyroxine 150 MCG tablet Commonly known as: SYNTHROID Take 1 tablet (150 mcg total) by mouth daily before breakfast.   omeprazole 40 MG capsule Commonly known as: PRILOSEC Take 1 capsule (40 mg total) by mouth daily.   QUEtiapine 25 MG tablet Commonly known as: SEROQUEL Take 1 tablet (25 mg total) by mouth at bedtime.   ticagrelor 90 MG Tabs tablet Commonly known as: BRILINTA Take 1 tablet (90 mg total) by mouth 2 (two) times daily.   Ventolin HFA 108 (90 Base) MCG/ACT inhaler Generic drug: albuterol TAKE 2 PUFFS BY MOUTH EVERY 6 HOURS AS NEEDED FOR WHEEZE OR SHORTNESS OF BREATH   Vitamin D (Ergocalciferol) 1.25 MG (50000 UT) Caps capsule Commonly known as: DRISDOL Take 1 capsule (50,000 Units total) by mouth every 7 (seven) days.         Time coordinating discharge: 32 minutes  Signed:  Jennye Boroughs  Pager [phone] [facility] 03/29/2019, 3:42 PM

## 2019-03-29 NOTE — ED Notes (Addendum)
Patient changed into gown, bedding changed, patient cleaned from urine incontinence, pure wick applied.

## 2019-03-29 NOTE — ED Notes (Signed)
Daughter called and made aware [facility] being transported to [facility] and what bed. PAtient unable to sign for transfer to hospital

## 2019-03-29 NOTE — H&P (Addendum)
History and Physical    Christy Carter [id number] DOB: 04-07-1934 DOA: 03/29/2019  PCP: Lavera Guise, MD  Patient coming from: [facility] I have personally briefly reviewed patient's old medical records in [place]  Chief Complaint: Increased confusion, unsteady gait and increasing left-sided weakness.    HPI: Christy Carter is a 85 y.o. female with medical history significant of hypertension, gout, hyperlipidemia, arthritis, hypothyroidism, recent ischemic stroke s/p and angioplasty presented with complaint of increased confusion, unsteady gait and increase left-sided weakness since 1 week.  Stroke work-up was done at [facility].  MRI brain showed small acute lacunar infarct in the subcortical white matter of the right middle frontal gyrus.  MRI head and neck also showed recurrent critical stenosis/near occlusion of the right MCA M1 segment which was angioplastied in September 2020.  Patient was discussed by admitting physician with neurologist on-call Dr. Amie Portland who recommended transfer to [facility] for possible endovascular evaluation to assess if she would be a candidate for repeat angioplasty versus stent.  She lives with her daughter and denies smoking, alcohol, illicit drug use.  Upon arrival: Patient lying comfortably on the bed.  Reports that she is doing better, denies headache, blurry vision, chest pain, shortness of breath, palpitation, leg swelling, fever, chills, N/V/D, decreased appetite or lethargy.  No history of loss of consciousness, seizures, head trauma, visual symptoms.  I called neurology on-call Dr. Cheral Marker about the arrival of the patient.  Review of Systems: As per HPI otherwise negative.    Past Medical History:  Diagnosis Date   Arthritis    Carpal tunnel syndrome    Complication of anesthesia    Difficulty waking up. Believes it took 2 days to wake up after her surgery.   Depression    Hyperlipidemia    Hypertension    Shortness of  breath    Stroke [facility])    Thyroid disease     Past Surgical History:  Procedure Laterality Date   APPENDECTOMY     BACK SURGERY     CHOLECYSTECTOMY     HERNIA REPAIR     IR CT HEAD LTD  02/05/2019   IR PTA INTRACRANIAL  02/05/2019   RADIOLOGY WITH ANESTHESIA N/A 02/05/2019   Procedure: CODE STROKE;  Surgeon: Radiologist, Medication, MD;  Location: [place];  Service: Radiology;  Laterality: N/A;     reports that she quit smoking about 26 years ago. Her smoking use included cigarettes. She has never used smokeless tobacco. She reports that she does not drink alcohol or use drugs.  No Known Allergies  Family History  Problem Relation Age of Onset   Heart disease Mother    Hypertension Mother    Diverticulitis Mother    Heart disease Father    Hypertension Father     Prior to Admission medications   Medication Sig Start Date End Date Taking? Authorizing Provider  acetaminophen (TYLENOL) 325 MG tablet Take 2 tablets (650 mg total) by mouth every 4 (four) hours as needed for mild pain (or temp > 37.5 C (99.5 F)). 03/08/19   Angiulli, Lavon Paganini, PA-C  allopurinol (ZYLOPRIM) 100 MG tablet Take 2 tabs at bed time for gout 01/12/19   Ronnell Freshwater, NP  aspirin EC 81 MG EC tablet Take 1 tablet (81 mg total) by mouth daily. 02/11/19   Donzetta Starch, NP  atorvastatin (LIPITOR) 40 MG tablet Take 1 tablet (40 mg total) by mouth daily at 6 PM. 03/08/19   Angiulli,  Lavon Paganini, PA-C  bisoprolol (ZEBETA) 5 MG tablet Take 0.5 tablets (2.5 mg total) by mouth daily. 03/08/19   Angiulli, Lavon Paganini, PA-C  cloNIDine (CATAPRES - DOSED IN MG/24 HR) 0.1 mg/24hr patch Place 1 patch (0.1 mg total) onto the skin every Monday. 03/14/19   Angiulli, Lavon Paganini, PA-C  colchicine 0.6 MG tablet Take 1 tablet (0.6 mg total) by mouth 2 (two) times daily. 12/06/18   Ronnell Freshwater, NP  Cyanocobalamin (B-12) 2500 MCG TABS Take 2,500 mcg by mouth daily. As needed for lethargy    [provider]    diclofenac sodium (VOLTAREN) 1 % GEL Apply 4 g topically 4 (four) times daily. Patient taking differently: Apply 4 g topically 4 (four) times daily as needed.  03/08/19   Angiulli, Lavon Paganini, PA-C  FLUoxetine (PROZAC) 10 MG capsule Take 1 capsule (10 mg total) by mouth daily. 03/08/19   Angiulli, Lavon Paganini, PA-C  levothyroxine (SYNTHROID) 150 MCG tablet Take 1 tablet (150 mcg total) by mouth daily before breakfast. 03/08/19   Angiulli, Lavon Paganini, PA-C  omeprazole (PRILOSEC) 40 MG capsule Take 1 capsule (40 mg total) by mouth daily. 03/08/19   Angiulli, Lavon Paganini, PA-C  QUEtiapine (SEROQUEL) 25 MG tablet Take 1 tablet (25 mg total) by mouth at bedtime. 03/08/19   Angiulli, Lavon Paganini, PA-C  ticagrelor (BRILINTA) 90 MG TABS tablet Take 1 tablet (90 mg total) by mouth 2 (two) times daily. 03/08/19   Angiulli, Lavon Paganini, PA-C  VENTOLIN HFA 108 (90 Base) MCG/ACT inhaler TAKE 2 PUFFS BY MOUTH EVERY 6 HOURS AS NEEDED FOR WHEEZE OR SHORTNESS OF BREATH 03/19/18   Ronnell Freshwater, NP  Vitamin D, Ergocalciferol, (DRISDOL) 1.25 MG (50000 UT) CAPS capsule Take 1 capsule (50,000 Units total) by mouth every 7 (seven) days. 02/02/19   Kendell Bane, NP    Physical Exam: Vitals:   03/29/19 1635  BP: (!) 177/65  Pulse: 75  Temp: 98 F (36.7 C)  TempSrc: Oral  SpO2: 97%    Constitutional: NAD, calm, comfortable Eyes: PERRL, lids and conjunctivae normal ENMT: Mucous membranes are moist. Posterior pharynx clear of any exudate or lesions.Normal dentition.  Neck: normal, supple, no masses, no thyromegaly Respiratory: clear to auscultation bilaterally, no wheezing, no crackles. Normal respiratory effort. No accessory muscle use.  Cardiovascular: Regular rate and rhythm, no murmurs / rubs / gallops. No extremity edema. 2+ pedal pulses. No carotid bruits.  Abdomen: no tenderness, no masses palpated. No hepatosplenomegaly. Bowel sounds positive.  Musculoskeletal: no clubbing / cyanosis. No joint deformity upper and  lower extremities. Good ROM, no contractures. Normal muscle tone.  Skin: no rashes, lesions, ulcers. No induration Neurologic: CN 2-12 grossly intact. Sensation intact, DTR normal.  Power 5 out of 5 in bilateral upper extremities, 3 out of 5 in left lower extremity and 4 out of 5 in right lower extremity. Psychiatric: Normal judgment and insight. Alert and oriented x 3. Normal mood.    Labs on Admission: I have personally reviewed following labs and imaging studies  CBC: Recent Labs  Lab 03/28/19 1457 03/29/19 0806  WBC 9.5 7.5  NEUTROABS 6.9  --   HGB 12.6 11.7*  HCT 40.1 37.2  MCV 94.1 93.7  PLT 277 [id number]   Basic Metabolic Panel: Recent Labs  Lab 03/28/19 1457 03/29/19 0806  NA 140 139  K 4.2 3.8  CL 103 105  CO2 25 26  GLUCOSE 102* 104*  BUN 21 17  CREATININE 0.86 0.71  CALCIUM  9.6 9.1   GFR: Estimated Creatinine Clearance: 59.5 mL/min (by C-G formula based on SCr of 0.71 mg/dL). Liver Function Tests: Recent Labs  Lab 03/28/19 1457 03/29/19 0806  AST 21 12*  ALT 10 8  ALKPHOS 97 79  BILITOT 1.0 0.8  PROT 7.2 6.4*  ALBUMIN 3.7 3.3*   No results for input(s): LIPASE, AMYLASE in the last 168 hours. No results for input(s): AMMONIA in the last 168 hours. Coagulation Profile: Recent Labs  Lab 03/28/19 2114  INR 1.0   Cardiac Enzymes: No results for input(s): CKTOTAL, CKMB, CKMBINDEX, TROPONINI in the last 168 hours. BNP (last 3 results) No results for input(s): PROBNP in the last 8760 hours. HbA1C: Recent Labs    03/29/19 0806  HGBA1C 5.5   CBG: No results for input(s): GLUCAP in the last 168 hours. Lipid Profile: Recent Labs    03/29/19 0806  CHOL 132  HDL 41  LDLCALC 66  TRIG 127  CHOLHDL 3.2   Thyroid Function Tests: No results for input(s): TSH, T4TOTAL, FREET4, T3FREE, THYROIDAB in the last 72 hours. Anemia Panel: No results for input(s): VITAMINB12, FOLATE, FERRITIN, TIBC, IRON, RETICCTPCT in the last 72 hours. Urine analysis:      Component Value Date/Time   COLORURINE YELLOW (A) 03/28/2019 1626   APPEARANCEUR CLEAR (A) 03/28/2019 1626   APPEARANCEUR Cloudy (A) 01/12/2019 0911   LABSPEC 1.016 03/28/2019 1626   LABSPEC 1.018 11/22/2013 1637   PHURINE 5.0 03/28/2019 1626   GLUCOSEU NEGATIVE 03/28/2019 1626   GLUCOSEU Negative 11/22/2013 1637   HGBUR MODERATE (A) 03/28/2019 1626   BILIRUBINUR NEGATIVE 03/28/2019 1626   BILIRUBINUR negative 02/02/2019 1033   BILIRUBINUR Negative 01/12/2019 0911   BILIRUBINUR Negative 11/22/2013 1637   KETONESUR NEGATIVE 03/28/2019 1626   PROTEINUR NEGATIVE 03/28/2019 1626   UROBILINOGEN 0.2 02/02/2019 1033   NITRITE NEGATIVE 03/28/2019 1626   LEUKOCYTESUR NEGATIVE 03/28/2019 1626   LEUKOCYTESUR Negative 11/22/2013 1637    Radiological Exams on Admission: Dg Chest 2 View  Result Date: 03/28/2019 CLINICAL DATA:  Generalized weakness. EXAM: CHEST - 2 VIEW COMPARISON:  02/17/2019 and CT chest 10/13/2018. FINDINGS: Patient is rotated. Heart is enlarged, stable. Thoracic aorta is calcified. Mild bibasilar streaky opacification. No dense airspace consolidation. Tiny left pleural effusion. Left hemidiaphragm is elevated. IMPRESSION: 1. Streaky bibasilar airspace opacification may be due to atelectasis. A viral or atypical pneumonia cannot be excluded. 2. Tiny left pleural effusion. 3.  Aortic atherosclerosis (ICD10-170.0). Electronically Signed   By: Lorin Picket M.D.   On: 03/28/2019 15:38   Ct Head Wo Contrast  Result Date: 03/28/2019 CLINICAL DATA:  Worsening weakness over the last 2 days. EXAM: CT HEAD WITHOUT CONTRAST CT CERVICAL SPINE WITHOUT CONTRAST TECHNIQUE: Multidetector CT imaging of the head and cervical spine was performed following the standard protocol without intravenous contrast. Multiplanar CT image reconstructions of the cervical spine were also generated. COMPARISON:  MRI 02/07/2019.  CT 02/05/2019 FINDINGS: CT HEAD FINDINGS Brain: Chronic atrophy and small-vessel  ischemic change of the white matter. No sign of acute infarction, mass lesion, hemorrhage, obstructive hydrocephalus or extra-axial collection. Ventriculomegaly secondary to central atrophy. Vascular: There is atherosclerotic calcification of the major vessels at the base of the brain. Skull: Negative Sinuses/Orbits: Clear/normal Other: None CT CERVICAL SPINE FINDINGS Alignment: Normal Skull base and vertebrae: No fracture or primary bone lesion. Soft tissues and spinal canal: Negative Disc levels: Degenerative spondylosis from C3-4 through C7-T1 with disc space narrowing and small osteophytes. Facet osteoarthritis on the right at C3-4  and C4-5 and on the left at C4-5. Mild foraminal narrowing on the right at C3-4 and C4-5. No traumatic finding. Upper chest: Negative Other: None IMPRESSION: Head CT: No acute or traumatic finding. Atrophy and chronic small-vessel ischemic changes. Cervical spine CT: No acute or traumatic finding. Degenerative spondylosis and facet osteoarthritis. Electronically Signed   By: Nelson Chimes M.D.   On: 03/28/2019 15:47   Ct Cervical Spine Wo Contrast  Result Date: 03/28/2019 CLINICAL DATA:  Worsening weakness over the last 2 days. EXAM: CT HEAD WITHOUT CONTRAST CT CERVICAL SPINE WITHOUT CONTRAST TECHNIQUE: Multidetector CT imaging of the head and cervical spine was performed following the standard protocol without intravenous contrast. Multiplanar CT image reconstructions of the cervical spine were also generated. COMPARISON:  MRI 02/07/2019.  CT 02/05/2019 FINDINGS: CT HEAD FINDINGS Brain: Chronic atrophy and small-vessel ischemic change of the white matter. No sign of acute infarction, mass lesion, hemorrhage, obstructive hydrocephalus or extra-axial collection. Ventriculomegaly secondary to central atrophy. Vascular: There is atherosclerotic calcification of the major vessels at the base of the brain. Skull: Negative Sinuses/Orbits: Clear/normal Other: None CT CERVICAL SPINE  FINDINGS Alignment: Normal Skull base and vertebrae: No fracture or primary bone lesion. Soft tissues and spinal canal: Negative Disc levels: Degenerative spondylosis from C3-4 through C7-T1 with disc space narrowing and small osteophytes. Facet osteoarthritis on the right at C3-4 and C4-5 and on the left at C4-5. Mild foraminal narrowing on the right at C3-4 and C4-5. No traumatic finding. Upper chest: Negative Other: None IMPRESSION: Head CT: No acute or traumatic finding. Atrophy and chronic small-vessel ischemic changes. Cervical spine CT: No acute or traumatic finding. Degenerative spondylosis and facet osteoarthritis. Electronically Signed   By: Nelson Chimes M.D.   On: 03/28/2019 15:47   Mr Angio Head Wo Contrast  Result Date: 03/29/2019 CLINICAL DATA:  85 year old female with increasing weakness, small acute right middle frontal gyrus infarct on brain MRI yesterday. History of multiple intracranial stenoses. Status post balloon angioplasty of critical right MCA stenosis on 02/05/2019. EXAM: MRA HEAD WITHOUT CONTRAST MRA NECK WITHOUT CONTRAST TECHNIQUE: Angiographic images of the Circle of Willis were obtained using MRA technique without intravenous contrast. Angiographic images of the neck were obtained using MRA technique without intravenous contrast. Carotid stenosis measurements (when applicable) are obtained utilizing NASCET criteria, using the distal internal carotid diameter as the denominator. COMPARISON:  Brain MRI 03/28/2019. Intracranial MRA 02/07/2019, CTA head and neck 02/05/2019, and earlier. FINDINGS: MRA NECK FINDINGS 3D time-of-flight images demonstrate antegrade flow signal in the bilateral cervical carotid arteries and the cervical left vertebral artery to the skull base. Absent flow signal in the cervical right vertebral artery compatible with known right Vertebral artery occlusion. Mild irregularity at both carotid bifurcations appears stable to that on the 02/05/2019 CTA and does not  appear hemodynamically significant. No left vertebral artery stenosis identified in the neck. MRA HEAD FINDINGS Antegrade flow in both ICA siphons appears stable since 02/07/2019, with mild stenosis on the left. Patent ophthalmic and right posterior communicating artery origins. Patent carotid termini, MCA and ACA origins. A 5-6 millimeter segment of lost flow signal has developed in the proximal right MCA M1 (series 9, image 8), where moderate right MCA irregularity and stenosis was present on the post angioplasty MRA. However, flow is reconstituted at the right MCA bifurcation, and the visible right MCA branches appear to remain patent. Left MCA M1 segment, left MCA trifurcation and left MCA branches appear stable and within normal limits. Chronic occlusion of the  left ACA A1. The right A1, anterior communicating artery and visible ACA branches appears stable. Chronic poor flow in the distal right vertebral artery, probably partially reconstituted in a retrograde fashion. Stable antegrade flow signal in the distal left vertebral artery to the vertebrobasilar junction. Stable basilar artery with mild irregularity and mild proximal stenosis. Patent basilar tip, SCA and PCA origins. Right posterior communicating artery is present, the left is diminutive or absent. Bilateral PCA branches appear stable with moderate to severe right P2 segment stenosis. IMPRESSION: 1. Recurrent Critical Stenosis/Near Occlusion of the Right MCA M1 segment which was angioplastied in September. Reconstituted right MCA bifurcation, with no right MCA branch occlusion identified. 2. Otherwise stable intracranial MRA including chronic left A1 occlusion, poor flow in the distal right vertebral artery, and severe right PCA stenosis. 3. Neck MRA redemonstrates chronic right Vertebral artery occlusion, with no significant left vertebral or cervical carotid artery stenosis. Salient findings discussed by telephone with PA Rufina Falco on  03/29/2019 at 0242 hours. Electronically Signed   By: Genevie Ann M.D.   On: 03/29/2019 02:48   Mr Angio Neck Wo Contrast  Result Date: 03/29/2019 CLINICAL DATA:  85 year old female with increasing weakness, small acute right middle frontal gyrus infarct on brain MRI yesterday. History of multiple intracranial stenoses. Status post balloon angioplasty of critical right MCA stenosis on 02/05/2019. EXAM: MRA HEAD WITHOUT CONTRAST MRA NECK WITHOUT CONTRAST TECHNIQUE: Angiographic images of the Circle of Willis were obtained using MRA technique without intravenous contrast. Angiographic images of the neck were obtained using MRA technique without intravenous contrast. Carotid stenosis measurements (when applicable) are obtained utilizing NASCET criteria, using the distal internal carotid diameter as the denominator. COMPARISON:  Brain MRI 03/28/2019. Intracranial MRA 02/07/2019, CTA head and neck 02/05/2019, and earlier. FINDINGS: MRA NECK FINDINGS 3D time-of-flight images demonstrate antegrade flow signal in the bilateral cervical carotid arteries and the cervical left vertebral artery to the skull base. Absent flow signal in the cervical right vertebral artery compatible with known right Vertebral artery occlusion. Mild irregularity at both carotid bifurcations appears stable to that on the 02/05/2019 CTA and does not appear hemodynamically significant. No left vertebral artery stenosis identified in the neck. MRA HEAD FINDINGS Antegrade flow in both ICA siphons appears stable since 02/07/2019, with mild stenosis on the left. Patent ophthalmic and right posterior communicating artery origins. Patent carotid termini, MCA and ACA origins. A 5-6 millimeter segment of lost flow signal has developed in the proximal right MCA M1 (series 9, image 8), where moderate right MCA irregularity and stenosis was present on the post angioplasty MRA. However, flow is reconstituted at the right MCA bifurcation, and the visible right  MCA branches appear to remain patent. Left MCA M1 segment, left MCA trifurcation and left MCA branches appear stable and within normal limits. Chronic occlusion of the left ACA A1. The right A1, anterior communicating artery and visible ACA branches appears stable. Chronic poor flow in the distal right vertebral artery, probably partially reconstituted in a retrograde fashion. Stable antegrade flow signal in the distal left vertebral artery to the vertebrobasilar junction. Stable basilar artery with mild irregularity and mild proximal stenosis. Patent basilar tip, SCA and PCA origins. Right posterior communicating artery is present, the left is diminutive or absent. Bilateral PCA branches appear stable with moderate to severe right P2 segment stenosis. IMPRESSION: 1. Recurrent Critical Stenosis/Near Occlusion of the Right MCA M1 segment which was angioplastied in September. Reconstituted right MCA bifurcation, with no right MCA branch occlusion identified.  2. Otherwise stable intracranial MRA including chronic left A1 occlusion, poor flow in the distal right vertebral artery, and severe right PCA stenosis. 3. Neck MRA redemonstrates chronic right Vertebral artery occlusion, with no significant left vertebral or cervical carotid artery stenosis. Salient findings discussed by telephone with PA Rufina Falco on 03/29/2019 at 0242 hours. Electronically Signed   By: Genevie Ann M.D.   On: 03/29/2019 02:48   Mr Brain Wo Contrast  Result Date: 03/28/2019 CLINICAL DATA:  85 year old female with increasing weakness for 2 days. Status post stroke in September. History of multiple severe intracranial stenoses EXAM: MRI HEAD WITHOUT CONTRAST TECHNIQUE: Multiplanar, multiecho pulse sequences of the brain and surrounding structures were obtained without intravenous contrast. COMPARISON:  Brain MRI and intracranial MRA 02/07/2019 and earlier. FINDINGS: Brain: Expected evolution of the right corona radiata infarct since  September. Small new 4-5 millimeter area of right middle frontal gyrus subcortical white matter restricted diffusion on series 9, image 34 and series 22, image 21. T2 and FLAIR hyperintensity with no hemorrhage or mass effect. Developing cystic encephalomalacia in the right corona radiata. No cortical encephalomalacia identified. Small chronic infarcts in the right PICA territory. Patchy and confluent bilateral periventricular white matter T2 and FLAIR hyperintensity. Chronic microhemorrhages in the right periatrial white matter. Mild superficial siderosis along the left lateral occipital lobe is also stable on series 17, image 24. No midline shift, mass effect, evidence of mass lesion, ventriculomegaly, extra-axial collection or acute intracranial hemorrhage. Cervicomedullary junction and pituitary are within normal limits. Vascular: Major intracranial vascular flow voids are stable, with evidence of decreased flow in the distal right vertebral artery. Skull and upper cervical spine: Negative visible cervical spine. Visualized bone marrow signal is within normal limits. Sinuses/Orbits: Stable and negative. Other: Mastoids remain clear. Scalp and face soft tissues appear negative. IMPRESSION: 1. Small acute lacunar infarct in subcortical white matter of the right middle frontal gyrus. No associated hemorrhage or mass effect. 2. Expected evolution of the right corona radiata infarct since September. Otherwise stable chronic small vessel disease. Mild left occipital lobe superficial siderosis. 3. Chronically abnormal flow in the distal right vertebral artery. Electronically Signed   By: Genevie Ann M.D.   On: 03/28/2019 19:40    EKG: Normal sinus rhythm, no ST elevation or depression noted.  Assessment/Plan Principal Problem:   Ischemic stroke [facility]) Active Problems:   Hypothyroidism   Essential hypertension   Ischemic stroke: -Secondary to recurrent critical stenosis/near occlusion of the right MCA M1  segment. -Reviewed MRI brain, MRA head and neck. -admit forTelemetry monitoring -Allow for permissive hypertension for the first 24-48h - only treat PRN if SBP 123456 mmHg or diastolic blood pressure [id number]. Blood pressures can be gradually normalized to SBP<140 upon discharge. -Last A1c checked on 02/06/2019 which showed 5.6%.  -Frequent neuro checks -Consulted neurology Dr. Pat Patrick will talk to Dr. Estanislado Pandy for endovascular evaluation to assess if she would be a candidate for repeat angioplasty versus stent. -PT/OT eval, Speech consult -Continue aspirin, atorvastatin & Brilinta  Hypertension: We will hold her home blood pressure medications-clonidine and Zebeta at this time to allow permissive hypertension -Monitor blood pressure closely.  Hypothyroidism: Continue levothyroxine.  Mixed hyperlipidemia: Reviewed lipid panel from September 2020.   -Continue statin.  Gout: Stable we will continue allopurinol.  Depression: Cont. Prozac & Seroquel  GERD: cont. PPI.  DVT prophylaxis: TED/SCD/lovenox Code Status: Full code-confirmed with the patient. Family Communication: None present at bedside.  Plan of care discussed with patient in length and  she verbalized understanding and agreed with it. Disposition Plan: TBD Consults called: Neurologist Dr. Cheral Marker  admission status: Inpatient.   Mckinley Jewel MD [facility] Pager [phone]  If 7PM-7AM, please contact night-coverage [email or web address] Password [facility]  03/29/2019, 5:34 PM

## 2019-03-29 NOTE — Progress Notes (Signed)
OT Cancellation Note  Patient Details Name: Christy Carter MRN: [id number] DOB: [birth year]   Cancelled Treatment:    Reason Eval/Treat Not Completed: Medical issues which prohibited therapy;Patient not medically ready. Consult received, chart reviewed. Per chart review patient to be transferred to [facility] for endovascular evaluation to assess if she would be a candidate for a repeat angioplasty versus stent. OT will sign off at this time and await updated OT orders once patient is medically ready for evaluation.   Jeni Salles, MPH, MS, OTR/L ascom [phone] 03/29/19, 12:15 PM

## 2019-03-29 NOTE — ED Notes (Signed)
Awaiting Bed at [facility] for transfer

## 2019-03-29 NOTE — Consult Note (Addendum)
Neurology Consultation  Reason for Consult: Stroke   Referring Physician: Dr. Doristine Bosworth  History is obtained from: Chart  HPI: Christy Carter is a 85 y.o. female with a history of thyroid disease, stroke, shortness of breath, hypertension, hyperlipidemia and carpal tunnel disease. She had a recent stroke work up for left sided weakness in late September and was diagnosed with severe right MCA stenosis that was treated with angioplasty by Dr. Estanislado Pandy   With the current presentation, the patient cannot recall the exact time of symptom onset; however, she did state that she first noticed LLE giveway weakness while ambulating last Thursday.  At that time she noticed that her left leg had weakness to the point where she could not stand on it.  She called her daughter who stated that she should seek medical attention right away. She ultimately presented to [facility] yesterday (11/16) via EMS. Notes document "concerns of increasing weakness for the last 2 days" on presentation to [facility]. The note from the ED presentation at [facility] differs somewhat from the patient's own recollection: "Patient lives with daughter and has home health and PT that come to her house. PT reported to family that patient was unable to get up from bed and ambulate like usual today."   At [facility], she had an MRI which showed a new small acute lacunar infarct in the subcortical white matter of the right middle frontal gyrus. Also noted on MRI was expected evolution of the right corona radiata infarct since September.   While at [facility] regional it was it was recommended to obtain vessel imaging-MRA head and neck were completed which showed a recurrent critical stenosis/near occlusion of the right M1 segment with patent branches past the occlusion. The patient was transferred to [facility] for endovascular re-evaluation given restenosis of the recently angioplastied right M1.   LKW: Last week tpa given?: no, minimal symptoms Premorbid modified Rankin  scale (mRS):1  Past Medical History:  Diagnosis Date  . Arthritis   . Carpal tunnel syndrome   . Complication of anesthesia    Difficulty waking up. Believes it took 2 days to wake up after her surgery.  . Depression   . Hyperlipidemia   . Hypertension   . Shortness of breath   . Stroke ([place])   . Thyroid disease     Family History  Problem Relation Age of Onset  . Heart disease Mother   . Hypertension Mother   . Diverticulitis Mother   . Heart disease Father   . Hypertension Father    Social History:   reports that she quit smoking about 26 years ago. Her smoking use included cigarettes. She has never used smokeless tobacco. She reports that she does not drink alcohol or use drugs.  Medications  Current Facility-Administered Medications:  .   stroke: mapping our early stages of recovery book, , Does not apply, Once, Pahwani, Rinka R, MD .  acetaminophen (TYLENOL) tablet 650 mg, 650 mg, Oral, Q4H PRN **OR** acetaminophen (TYLENOL) 160 MG/5ML solution 650 mg, 650 mg, Per Tube, Q4H PRN **OR** acetaminophen (TYLENOL) suppository 650 mg, 650 mg, Rectal, Q4H PRN, Pahwani, Rinka R, MD .  enoxaparin (LOVENOX) injection 40 mg, 40 mg, Subcutaneous, Q24H, Pahwani, Rinka R, MD   Exam: Current vital signs: BP (!) 177/65 (BP Location: Left Arm)   Pulse 75   Temp 98 F (36.7 C) (Oral)   SpO2 97%  Vital signs in last 24 hours: Temp:  [97.4 F (36.3 C)-98 F (36.7 C)] 98 F (  36.7 C) (11/17 1635) Pulse Rate:  [69-80] 75 (11/17 1635) Resp:  [18-24] 19 (11/17 1538) BP: (141-177)/(47-85) 177/65 (11/17 1635) SpO2:  [94 %-98 %] 97 % (11/17 1635)  ROS:  As per HPI. Comprehensive ROS otherwise negative.    Physical Exam  Constitutional: Appears well-developed and well-nourished.  Psych: Affect appropriate to situation Eyes: No scleral injection HENT: No OP obstrucion Head: Normocephalic.  Cardiovascular: Normal rate and regular rhythm.  Respiratory: Effort normal, non-labored  breathing GI: Soft.  No distension. There is no tenderness.  Skin: WDI  Neuro: Mental Status: Oriented to year, day of week, but not month. She also give inconsistent responses regarding the state and could not recall the city. Unable to name a pinky finger but did name index finger. Fluent speech. No dysarthria. Able to follow all commands. Cranial Nerves: II: Visual Fields are full. PERRL.  III,IV, VI: EOMI without ptosis or diplopia.  V: Facial sensation is symmetric to temperature VII: Facial movement is symmetric.  VIII: hearing is intact to voice X: Palate elevates symmetrically XI: Shoulder shrug is symmetric. XII: tongue is midline without atrophy or fasciculations.  Motor: Tone is normal. Bulk is normal. 5/5 strength was present in all four extremities. Trace knee flexion weakness on the left.  Sensory: Decreased sensation to temperature in left leg. No extinction. Temperature is normal bilateral UE. Fine touch intact UE and LE Deep Tendon Reflexes: 3+ right AJ and 2+ left KJ, 2+ bilateral UE  Cerebellar: FNF slight ataxia left UE.   Labs I have reviewed labs in epic and the results pertinent to this consultation are:   CBC    Component Value Date/Time   WBC 7.5 03/29/2019 0806   RBC 3.97 03/29/2019 0806   HGB 11.7 (L) 03/29/2019 0806   HGB 15.2 12/15/2017 1039   HCT 37.2 03/29/2019 0806   HCT 43.4 12/15/2017 1039   PLT 225 03/29/2019 0806   PLT 254 12/15/2017 1039   MCV 93.7 03/29/2019 0806   MCV 88 12/15/2017 1039   MCV 93 11/22/2013 1637   MCH 29.5 03/29/2019 0806   MCHC 31.5 03/29/2019 0806   RDW 15.5 03/29/2019 0806   RDW 13.9 12/15/2017 1039   RDW 13.9 11/22/2013 1637   LYMPHSABS 1.1 03/28/2019 1457   LYMPHSABS 1.9 12/15/2017 1039   LYMPHSABS 2.1 09/06/2013 1544   MONOABS 0.9 03/28/2019 1457   MONOABS 0.8 09/06/2013 1544   EOSABS 0.5 03/28/2019 1457   EOSABS 0.3 12/15/2017 1039   EOSABS 0.3 09/06/2013 1544   BASOSABS 0.1 03/28/2019 1457    BASOSABS 0.1 12/15/2017 1039   BASOSABS 0.0 09/06/2013 1544    CMP     Component Value Date/Time   NA 139 03/29/2019 0806   NA 143 11/25/2018 1046   NA 140 11/22/2013 1637   K 3.8 03/29/2019 0806   K 3.4 (L) 11/22/2013 1637   CL 105 03/29/2019 0806   CL 106 11/22/2013 1637   CO2 26 03/29/2019 0806   CO2 25 11/22/2013 1637   GLUCOSE 104 (H) 03/29/2019 0806   GLUCOSE 105 (H) 11/22/2013 1637   BUN 17 03/29/2019 0806   BUN 34 (H) 11/25/2018 1046   BUN 35 (H) 11/22/2013 1637   CREATININE 0.71 03/29/2019 0806   CREATININE 1.51 (H) 11/22/2013 1637   CALCIUM 9.1 03/29/2019 0806   CALCIUM 8.3 (L) 11/22/2013 1637   PROT 6.4 (L) 03/29/2019 0806   PROT 6.5 11/25/2018 1046   PROT 6.4 11/22/2013 1637   ALBUMIN 3.3 (L) 03/29/2019  0806   ALBUMIN 3.9 11/25/2018 1046   ALBUMIN 3.4 11/22/2013 1637   AST 12 (L) 03/29/2019 0806   AST 16 11/22/2013 1637   ALT 8 03/29/2019 0806   ALT 18 11/22/2013 1637   ALKPHOS 79 03/29/2019 0806   ALKPHOS 81 11/22/2013 1637   BILITOT 0.8 03/29/2019 0806   BILITOT 0.2 11/25/2018 1046   BILITOT 0.4 11/22/2013 1637   GFRNONAA >60 03/29/2019 0806   GFRNONAA 33 (L) 11/22/2013 1637   GFRAA >60 03/29/2019 0806   GFRAA 38 (L) 11/22/2013 1637    Lipid Panel     Component Value Date/Time   CHOL 132 03/29/2019 0806   CHOL 98 09/07/2013 0358   TRIG 127 03/29/2019 0806   TRIG 63 09/07/2013 0358   HDL 41 03/29/2019 0806   HDL 43 09/07/2013 0358   CHOLHDL 3.2 03/29/2019 0806   VLDL 25 03/29/2019 0806   VLDL 13 09/07/2013 0358   LDLCALC 66 03/29/2019 0806   LDLCALC 42 09/07/2013 0358     Imaging I have reviewed the images obtained:  CT-scan of the brain-no acute or traumatic findings.  Atrophy and chronic small vessel disease noted  MRI examination of the brain-small acute lacunar infarct in subcortical white matter of the right middle frontal gyrus.  No associated hemorrhage or mass-effect.  Expected evolution of the right corona radiata infarct since  September.  MR angio head without contrast-recurrent critical stenosis/near occlusion of the right MCA M1 segment which was angioplastied in September.  Reconstitute right MCA bifurcation, with no right MCA branch occlusion identified.  Otherwise stable intracranial MRA including chronic left A1 occlusion, poor flow in the distal right vertebral artery and severe right PCA stenosis.  Neck MRA-redemonstrated chronic right vertebral artery occlusion, with no significant left vertebral or cervical carotid artery stenosis  Etta Quill PA-C [facility] [phone] 03/29/2019, 5:38 PM    Assessment:  85 year old female re-presenting with stroke symptoms after recent angioplasty of right M1 segment here at [facility] during an admission in late September for an acute stroke at that time.  1. Current presentation is for LLE weakness.  2. MRI shows a subcentimeter acute deep white matter ischemic infarction in the right frontal lobe.  3. MRA of head reveals recurrent critical stenosis/near occlusion of the right MCA M1 segment which was angioplastied in September.   4. Stroke risk factors: Prior stroke, HLD and HTN  Recommendations: -Endovascular evaluation to assess if she would be a candidate for repeat angioplasty with stenting. --Discussed with Dr. Estanislado Pandy, who has been officially consulted and will see the patient in the AM.  --NPO after midnight. --Bolusing 1 L NS followed by 75 cc/hr --Continue DAPT with ASA and Brilinta  --Continue atorvastatin  Electronically signed: Dr. Kerney Elbe

## 2019-03-29 NOTE — ED Notes (Signed)
Patient eating lunch tray at this time. Patient given milk upon request

## 2019-03-29 NOTE — Telephone Encounter (Signed)
Social worker  Called [phone] that she try to visit pt today but pt in the hospital she going try later

## 2019-03-30 MED ORDER — HYDRALAZINE HCL 20 MG/ML IJ SOLN
10.0000 mg | Freq: Four times a day (QID) | INTRAMUSCULAR | Status: DC | PRN
  Administered 2019-03-31 – 2019-04-10 (×5): 10 mg via INTRAVENOUS
  Filled 2019-03-30 (×5): qty 1

## 2019-03-30 MED ORDER — BISOPROLOL FUMARATE 5 MG PO TABS
2.5000 mg | ORAL_TABLET | Freq: Every day | ORAL | Status: DC
  Administered 2019-03-30 – 2019-04-11 (×12): 2.5 mg via ORAL
  Filled 2019-03-30 (×14): qty 0.5

## 2019-03-30 MED ORDER — SODIUM CHLORIDE 0.9 % IV SOLN: INTRAVENOUS | Status: DC

## 2019-03-30 MED ORDER — SODIUM CHLORIDE 0.9 % IV SOLN
INTRAVENOUS | Status: AC
  Administered 2019-03-31: via INTRAVENOUS

## 2019-03-30 NOTE — Progress Notes (Signed)
PROGRESS NOTE  BROOK ZEINER  DOB: Nov 11, 1933  PCP: Lavera Guise, MD [id number]  DOA: 03/29/2019  LOS: 1 day   No chief complaint on file.  Brief narrative: Christy Carter is a 85 y.o. female with PMH of hypertension, gout, hyperlipidemia, arthritis, hypothyroidism, recent ischemic stroke s/p and angioplasty who presented with complaint of increased confusion, unsteady gait and increase left-sided weakness since 1 week.   Stroke work-up was started at [facility]   MRI brain showed small acute lacunar infarct in the subcortical white matter of the right middle frontal gyrus.  MRI head and neck also showed recurrent critical stenosis/near occlusion of the right MCA M1 segment which was angioplastied in September 2020.   Patient was discussed by admitting physician with neurologist on-call Dr. Amie Portland who recommended transfer to [facility] for possible endovascular evaluation to assess if she would be a candidate for repeat angioplasty versus stent. Neurology and vascular surgery consult were obtained.  Subjective: Patient was seen and examined this morning.  Pleasant elderly Caucasian female.  Not in distress.  Assessment/Plan: Acute ischemic stroke: -Secondary to recurrent critical stenosis/near occlusion of the right MCA M1 segment. -MRI brain, MRA head and neck findings as above. -Neurology consultation appreciated.  Neurovascular consultation called as well for probable need of repeat angioplasty versus stenting. -PT/OT eval, Speech consulted -Continue aspirin, atorvastatin & Brilinta  Hypertension -Currently on hold home blood pressure medications-clonidine and Zebeta for permissive hypertension.  Blood pressure seems to be worsening.  Will resume those medicines from tomorrow.    Hypothyroidism: Continue levothyroxine.  Mixed hyperlipidemia: Reviewed lipid panel from September 2020.   -Continue statin.  Gout: Stable we will continue allopurinol.   Depression: Cont. Prozac & Seroquel  GERD: cont. PPI.  Mobility: PT/OT ordered DVT prophylaxis:  Lovenox subcu Code Status:   Code Status: Full Code  Family Communication:  Expected Discharge:  Pending intervention by neuroradiology  Consultants:  Neurology, neuroradiology  Procedures:    Antimicrobials: Anti-infectives (From admission, onward)   None      Diet Order            Diet NPO time specified  Diet effective midnight        DIET DYS 3 Room service appropriate? Yes; Fluid consistency: Thin  Diet effective now              Infusions:  . [START ON 03/31/2019] sodium chloride      Scheduled Meds: . allopurinol  100 mg Oral QHS  . aspirin  81 mg Oral Daily  . atorvastatin  40 mg Oral q1800  . enoxaparin (LOVENOX) injection  40 mg Subcutaneous Q24H  . FLUoxetine  10 mg Oral Daily  . levothyroxine  150 mcg Oral Q0600  . pantoprazole  40 mg Oral Daily  . QUEtiapine  25 mg Oral QHS  . ticagrelor  90 mg Oral BID    PRN meds: acetaminophen **OR** acetaminophen (TYLENOL) oral liquid 160 mg/5 mL **OR** acetaminophen   Objective: Vitals:   03/30/19 1115 03/30/19 1154  BP: (!) 166/57   Pulse: 85   Resp: (!) 25   Temp:  98.6 F (37 C)  SpO2: 98%     Intake/Output Summary (Last 24 hours) at 03/30/2019 1528 Last data filed at 03/30/2019 1157 Gross per 24 hour  Intake 900 ml  Output 450 ml  Net 450 ml   There were no vitals filed for this visit. Weight change:  There is no height or weight on  file to calculate BMI.   Physical Exam: General exam: Appears calm and comfortable.  Skin: No rashes, lesions or ulcers. HEENT: Atraumatic, normocephalic, supple neck, no obvious bleeding Lungs: Clear to auscultation bilaterally CVS: Regular rate and rhythm, no murmur GI/Abd soft, nontender, nondistended, bowel sound present CNS: Alert, cooperative x3 Psychiatry: Mood appropriate Extremities: No pedal edema, no calf tenderness  Data Review: I have  personally reviewed the laboratory data and studies available.  Recent Labs  Lab 03/28/19 1457 03/29/19 0806 03/29/19 1813  WBC 9.5 7.5 8.1  NEUTROABS 6.9  --   --   HGB 12.6 11.7* 13.0  HCT 40.1 37.2 39.4  MCV 94.1 93.7 92.1  PLT 277 225 227   Recent Labs  Lab 03/28/19 1457 03/29/19 0806 03/29/19 1813  NA 140 139  --   K 4.2 3.8  --   CL 103 105  --   CO2 25 26  --   GLUCOSE 102* 104*  --   BUN 21 17  --   CREATININE 0.86 0.71 0.77  CALCIUM 9.6 9.1  --     Terrilee Croak, MD  [facility] 03/30/2019

## 2019-03-30 NOTE — Progress Notes (Signed)
Occupational Therapy Evaluation Patient Details Name: Christy Carter MRN: [id number] DOB: Jul 26, 1933 Today's Date: 03/30/2019    History of Present Illness 85 y.o. female with medical history significant of hypertension, gout, hyperlipidemia, arthritis, hypothyroidism, recent ischemic stroke s/p and angioplasty presented with complaint of increased confusion, unsteady gait and increase left-sided weakness for 1 week.  Stroke work-up was done at [facility].  MRI brain showed small acute lacunar infarct in the subcortical white matter of the right middle frontal gyrus.  MRI head and neck also showed recurrent critical stenosis/near occlusion of the right MCA M1 segment which was angioplastied in September 2020.    Clinical Impression   PTA, pt was living with daughter and required assist for BADLs. Pt currently presents with decreased strength, ROM, activity tolerance, safety awareness, L hemiparesis, and decreased cognition. Pt presenting poor awareness of deficits, decreased attention, and difficulty following simple commands and sequencing through tasks. Pt experienced incontinence of bladder each time she attempted to stand throughout session. Pt required mod A- max A +2 for toileting and LB dressing; required min guard A- min A for UB ADLs. Pt would benefit from continued OT acutely to address deficits and facilitate safe dc. Recommend dc to SNF to increase safe performance of ADLs.      Follow Up Recommendations  SNF;Supervision/Assistance - 24 hour    Equipment Recommendations  Other (comment)(defer to next venue)    Recommendations for Other Services PT consult     Precautions / Restrictions Precautions Precautions: Fall;Other (comment) Precaution Comments: L inattention, left hemiparesis Restrictions Weight Bearing Restrictions: No      Mobility Bed Mobility Overal bed mobility: Needs Assistance Bed Mobility: Supine to Sit;Sit to Supine     Supine to sit: HOB elevated;Min  assist Sit to supine: Min guard   General bed mobility comments: Pt required min A and VCs to scoot hips to come to sitting EOB. Required min guard A to return to supine  Transfers Overall transfer level: Needs assistance Equipment used: Rolling walker (2 wheeled);2 person hand held assist Transfers: Sit to/from [facility] Sit to Stand: Max assist;+2 physical assistance;+2 safety/equipment Stand pivot transfers: Mod assist;+2 physical assistance;+2 safety/equipment       General transfer comment: Pt required max A +2 to pwoer up to stand and gain balance, required mod A +2 to pivot to [facility]    Balance Overall balance assessment: Needs assistance Sitting-balance support: Single extremity supported;Feet unsupported Sitting balance-Leahy Scale: Fair     Standing balance support: Bilateral upper extremity supported;During functional activity Standing balance-Leahy Scale: Poor Standing balance comment: Pt required BUE support on RW and +2 assist to maintain balance                           ADL either performed or assessed with clinical judgement   ADL Overall ADL's : Needs assistance/impaired Eating/Feeding: Supervision/ safety;Sitting   Grooming: Brushing hair;Sitting;Minimal assistance;Supervision/safety Grooming Details (indicate cue type and reason): Pt required supervision, min A to reach to back of her hair.  Upper Body Bathing: Min guard;Sitting   Lower Body Bathing: Cueing for sequencing;Maximal assistance;+2 for physical assistance;+2 for safety/equipment;Sit to/from stand;Moderate assistance Lower Body Bathing Details (indicate cue type and reason): Pt required mod A- max A +2 to wash feet and power up to stand for peri-care Upper Body Dressing : Sitting;Min guard   Lower Body Dressing: Maximal assistance;+2 for physical assistance;+2 for safety/equipment;Sit to/from stand Lower Body Dressing Details (indicate cue type  and reason): Pt required  max A to don/doff socks sitting EOB, and max A +2 to power up to stand Toilet Transfer: Maximal assistance;+2 for physical assistance;+2 for safety/equipment;Moderate assistance;Stand-pivot;BSC;RW Toilet Transfer Details (indicate cue type and reason): Pt required max A +2 to power up to stand, and mod A +2 to pivot to [facility]. Toileting- Clothing Manipulation and Hygiene: Maximal assistance;+2 for physical assistance;+2 for safety/equipment;Sit to/from stand Toileting - Clothing Manipulation Details (indicate cue type and reason): Pt required max A +2 to power up to stand and for peri-care     Functional mobility during ADLs: Moderate assistance;Maximal assistance;+2 for physical assistance;+2 for safety/equipment;Rolling walker General ADL Comments: Pt reported needing to use the bathroom while sitting EOB, had incontinence of bladder at EOB. Pt required mod A- max A +2 for stand pivot to [facility], and had incontinence of bladder each time she stood. Pt required max A +2 for toilet hygiene and LB dressing.      Vision Baseline Vision/History: Wears glasses Wears Glasses: At all times       Perception     Praxis      Pertinent Vitals/Pain Pain Assessment: No/denies pain Pain Intervention(s): Monitored during session     Hand Dominance Right   Extremity/Trunk Assessment Upper Extremity Assessment Upper Extremity Assessment: LUE deficits/detail LUE Deficits / Details: Pt with decreased AROM and strength of LUE   Lower Extremity Assessment Lower Extremity Assessment: Defer to PT evaluation   Cervical / Trunk Assessment Cervical / Trunk Assessment: Normal   Communication Communication Communication: No difficulties   Cognition Arousal/Alertness: Awake/alert Behavior During Therapy: WFL for tasks assessed/performed Overall Cognitive Status: Impaired/Different from baseline Area of Impairment: Attention;Memory;Following commands;Safety/judgement;Awareness;Problem solving                    Current Attention Level: Sustained Memory: Decreased short-term memory Following Commands: Follows one step commands inconsistently;Follows one step commands with increased time Safety/Judgement: Decreased awareness of safety;Decreased awareness of deficits Awareness: Intellectual Problem Solving: Slow processing;Decreased initiation;Difficulty sequencing;Requires verbal cues;Requires tactile cues General Comments: Pt demonstrated poor awareness of safety and deficits, and required increased time for simple commands. Pt had incontinence of bladder each time she attempted to stand. Pt required mod VCs throughout to sequence tasks.   General Comments  pt VSS throughout    Exercises     Shoulder Instructions      Home Living Family/patient expects to be discharged to:: Skilled nursing facility Living Arrangements: Children                                      Prior Functioning/Environment Level of Independence: Needs assistance  Gait / Transfers Assistance Needed: uses RW for functional mobility ADL's / Homemaking Assistance Needed: Pt required assist from daughter for BADLs            OT Problem List: Decreased strength;Decreased range of motion;Decreased activity tolerance;Impaired balance (sitting and/or standing);Decreased coordination;Decreased cognition;Decreased safety awareness;Decreased knowledge of use of DME or AE;Decreased knowledge of precautions      OT Treatment/Interventions: Self-care/ADL training;Therapeutic exercise;Energy conservation;DME and/or AE instruction;Therapeutic activities;Patient/family education    OT Goals(Current goals can be found in the care plan section) Acute Rehab OT Goals Patient Stated Goal: go home OT Goal Formulation: With patient Time For Goal Achievement: 04/13/19 Potential to Achieve Goals: Good  OT Frequency: Min 2X/week   Barriers to D/C:  Co-evaluation PT/OT/SLP  Co-Evaluation/Treatment: Yes Reason for Co-Treatment: Complexity of the patient's impairments (multi-system involvement);To address functional/ADL transfers;For patient/therapist safety PT goals addressed during session: Mobility/safety with mobility OT goals addressed during session: ADL's and self-care      AM-PAC OT "6 Clicks" Daily Activity     Outcome Measure Help from another person eating meals?: None Help from another person taking care of personal grooming?: A Little Help from another person toileting, which includes using toliet, bedpan, or urinal?: A Lot Help from another person bathing (including washing, rinsing, drying)?: A Lot Help from another person to put on and taking off regular upper body clothing?: A Little Help from another person to put on and taking off regular lower body clothing?: A Lot 6 Click Score: 16   End of Session Equipment Utilized During Treatment: Gait belt;Rolling walker Nurse Communication: Mobility status  Activity Tolerance: Patient tolerated treatment well Patient left: in bed;with call bell/phone within reach;with bed alarm set;with restraints reapplied  OT Visit Diagnosis: Unsteadiness on feet (R26.81);Other abnormalities of gait and mobility (R26.89);Muscle weakness (generalized) (M62.81);Other symptoms and signs involving cognitive function;Hemiplegia and hemiparesis Hemiplegia - Right/Left: Left Hemiplegia - dominant/non-dominant: Non-Dominant Hemiplegia - caused by: Cerebral infarction                Time: 0927-1001 OT Time Calculation (min): 34 min Charges:  OT General Charges $OT Visit: 1 Visit OT Evaluation $OT Eval Moderate Complexity: 1 Mod  Gus Rankin, OT Student  Gus Rankin 03/30/2019, 11:42 AM

## 2019-03-30 NOTE — [place] FL2 (Signed)
[place] MEDICAID FL2 LEVEL OF CARE SCREENING TOOL     IDENTIFICATION  Patient Name: Christy Carter Birthdate: Jul 04, 1933 Sex: female Admission Date (Current Location): 03/29/2019  [facility] [place] and [place] Number:  Herbalist and Address:  [facility], [place] [address], [place], [place] [address]      Provider Number: [id number]  Attending Physician Name and Address:  Terrilee Croak, MD  Relative Name and Phone Number:  Earlie Server [phone]    Current Level of Care: Hospital Recommended Level of Care: [place] Prior Approval Number:    Date Approved/Denied:   PASRR Number: [id number]  Discharge Plan: SNF    Current Diagnoses: Patient Active Problem List   Diagnosis Date Noted  . CVA (cerebral vascular accident) ([place]) 03/29/2019  . Ischemic stroke ([place]) 03/29/2019  . Acute CVA (cerebrovascular accident) ([place]) 03/28/2019  . Sleep disturbance   . Benign essential HTN   . Acute lower UTI   . Pseudodementia   . Labile blood pressure   . Metabolic encephalopathy   . Hyperglycemia   . Encephalopathy   . AKI (acute kidney injury) ([place])   . Leukocytosis   . Dysphagia, post-stroke   . Acute bilat watershed infarction [facility]) 02/11/2019  . Agitation requiring sedation protocol 02/09/2019  . Dysphagia 02/09/2019  . Stroke (cerebrum) ([place]) - R watershed d/t large vessel dz & small L temp lobe & R cerebellar infarcts 02/05/2019  . Middle cerebral artery stenosis, right 02/05/2019  . Acute respiratory failure ([place]) 02/03/2019  . Acute respiratory failure with hypoxemia ([place]) 02/03/2019  . Abnormal renal function 01/19/2019  . Pain in left foot 12/19/2018  . Hematuria 08/02/2018  . Influenza A 03/02/2018  . Urinary tract infection without hematuria 03/02/2018  . Sepsis ([place]) 02/11/2018  . CAP (community acquired pneumonia) 02/11/2018  . CKD (chronic kidney disease), stage III 02/11/2018  . Memory loss, short term 02/10/2018  .  Flu vaccine need 02/10/2018  . Chest pain 02/03/2018  . Acute upper respiratory infection 02/03/2018  . Gastroesophageal reflux disease without esophagitis 02/03/2018  . Encounter for general adult medical examination with abnormal findings 01/13/2018  . Chronic low back pain 01/13/2018  . Need for vaccination against Streptococcus pneumoniae using pneumococcal conjugate vaccine 7 01/13/2018  . Dysuria 01/13/2018  . 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 Pelvic pain 10/08/2017  . Gout 06/15/2017  . Mixed hyperlipidemia 06/15/2017  . Inflammatory polyarthropathy ([place]) 06/15/2017  . Neuralgia and neuritis, unspecified 06/15/2017  . Major depressive disorder, recurrent, mild ([place]) 06/15/2017  . Other vitamin B12 deficiency anemias 06/15/2017  . Hypothyroidism 10/17/2008  . OVERWEIGHT/OBESITY 10/17/2008  . SLEEP APNEA, OBSTRUCTIVE 10/17/2008  . Essential hypertension 10/17/2008  . GALLSTONES 10/17/2008  . DYSPNEA 10/17/2008  . Obesity 10/17/2008    Orientation RESPIRATION BLADDER Height & Weight     Self  Normal Incontinent Weight:   Height:     BEHAVIORAL SYMPTOMS/MOOD NEUROLOGICAL BOWEL NUTRITION STATUS      Incontinent Diet(Regular)  AMBULATORY STATUS COMMUNICATION OF NEEDS Skin   Extensive Assist Verbally Skin abrasions(MASD, R/L Breast, Buttock, Leg)                       Personal Care Assistance Level of Assistance  Bathing, Feeding, Dressing Bathing Assistance: Maximum assistance Feeding assistance: Limited assistance Dressing Assistance: Maximum assistance     Functional Limitations Info  Sight Sight Info: Impaired        SPECIAL CARE FACTORS  FREQUENCY  PT (By licensed PT), OT (By licensed OT), Speech therapy     PT Frequency: 5x/week OT Frequency: 5x/week     Speech Therapy Frequency: 2x/week      Contractures Contractures Info: Not present    Additional Factors Info  Code Status, Allergies, Psychotropic Code Status Info:  Full Allergies Info: NKA Psychotropic Info: Prozac 10mg  1x/week, Seroquel 25 mg 1x/day         Current Medications (03/30/2019):  This is the current hospital active medication list Current Facility-Administered Medications  Medication Dose Route Frequency Provider Last Rate Last Dose  . [START ON 03/31/2019] 0.9 %  sodium chloride infusion   Intravenous Continuous Rosalin Hawking, MD      . acetaminophen (TYLENOL) tablet 650 mg  650 mg Oral Q4H PRN Pahwani, Rinka R, MD       Or  . acetaminophen (TYLENOL) 160 MG/5ML solution 650 mg  650 mg Per Tube Q4H PRN Pahwani, Rinka R, MD       Or  . acetaminophen (TYLENOL) suppository 650 mg  650 mg Rectal Q4H PRN Pahwani, Rinka R, MD      . allopurinol (ZYLOPRIM) tablet 100 mg  100 mg Oral QHS Pahwani, Rinka R, MD   100 mg at 03/29/19 2133  . aspirin chewable tablet 81 mg  81 mg Oral Daily Pahwani, Rinka R, MD   81 mg at 03/30/19 1133  . atorvastatin (LIPITOR) tablet 40 mg  40 mg Oral q1800 Pahwani, Rinka R, MD      . enoxaparin (LOVENOX) injection 40 mg  40 mg Subcutaneous Q24H Pahwani, Rinka R, MD   40 mg at 03/29/19 1843  . FLUoxetine (PROZAC) capsule 10 mg  10 mg Oral Daily Pahwani, Rinka R, MD   10 mg at 03/30/19 1133  . levothyroxine (SYNTHROID) tablet 150 mcg  150 mcg Oral Q0600 Pahwani, Rinka R, MD   150 mcg at 03/30/19 0522  . pantoprazole (PROTONIX) EC tablet 40 mg  40 mg Oral Daily Pahwani, Rinka R, MD   40 mg at 03/30/19 1133  . QUEtiapine (SEROQUEL) tablet 25 mg  25 mg Oral QHS Pahwani, Rinka R, MD   25 mg at 03/29/19 2133  . ticagrelor (BRILINTA) tablet 90 mg  90 mg Oral BID Pahwani, Rinka R, MD   90 mg at 03/30/19 1133     Discharge Medications: Please see discharge summary for a list of discharge medications.  Relevant Imaging Results:  Relevant Lab Results:   Additional Information ss# [id number]  Kirstie Peri, Student-Social Work

## 2019-03-30 NOTE — TOC Initial Note (Signed)
Transition of Care [facility]) - Initial/Assessment Note    Patient Details  Name: Christy Carter MRN: [id number] Date of Birth: [birth year]  [facility] CM/SW Contact:    Kirstie Peri, [place] Work Phone Number: 03/30/2019, 1:44 PM  Clinical Narrative:                 MSW Intern spoke with PT daughter, Earlie Server) to discuss discharge planning. Daughter agreeable to SNF and would prefer a placement in [place]. Daughter noted concern that pt is too much for her to care for at home, after rehab. Daughter cites that PT keeps having strokes, is forgetful, and PT tries to get out of bed despite not being able to walk.   MSW supervisor spoke with daughter and advised that we are unable to make permanent placement plans, but the SNF would be able to help with that process. MSW supervisor advised that Medicare would be beneficial to have for PT, so she could eventually go to long term facility. MSW Intern completed initial assessment and FL2. SW will continue to follow.        Patient Goals and CMS Choice        Expected Discharge Plan and Services                                                Prior Living Arrangements/Services                       Activities of Daily Living      Permission Sought/Granted                  Emotional Assessment              Admission diagnosis:  ACUTE CVA Patient Active Problem List   Diagnosis Date Noted  . CVA (cerebral vascular accident) ([place]) 03/29/2019  . Ischemic stroke ([place]) 03/29/2019  . Acute CVA (cerebrovascular accident) ([place]) 03/28/2019  . Sleep disturbance   . Benign essential HTN   . Acute lower UTI   . Pseudodementia   . Labile blood pressure   . Metabolic encephalopathy   . Hyperglycemia   . Encephalopathy   . AKI (acute kidney injury) ([place])   . Leukocytosis   . Dysphagia, post-stroke   . Acute bilat watershed infarction [facility]) 02/11/2019  . Agitation requiring sedation  protocol 02/09/2019  . Dysphagia 02/09/2019  . Stroke (cerebrum) ([place]) - R watershed d/t large vessel dz & small L temp lobe & R cerebellar infarcts 02/05/2019  . Middle cerebral artery stenosis, right 02/05/2019  . Acute respiratory failure ([place]) 02/03/2019  . Acute respiratory failure with hypoxemia ([place]) 02/03/2019  . Abnormal renal function 01/19/2019  . Pain in left foot 12/19/2018  . Hematuria 08/02/2018  . Influenza A 03/02/2018  . Urinary tract infection without hematuria 03/02/2018  . Sepsis ([place]) 02/11/2018  . CAP (community acquired pneumonia) 02/11/2018  . CKD (chronic kidney disease), stage III 02/11/2018  . Memory loss, short term 02/10/2018  . Flu vaccine need 02/10/2018  . Chest pain 02/03/2018  . Acute upper respiratory infection 02/03/2018  . Gastroesophageal reflux disease without esophagitis 02/03/2018  . Encounter for general adult medical examination with abnormal findings 01/13/2018  . Chronic low back pain 01/13/2018  . Need for vaccination against Streptococcus pneumoniae using pneumococcal conjugate vaccine 7  01/13/2018  . Dysuria 01/13/2018  . 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 Pelvic pain 10/08/2017  . Gout 06/15/2017  . Mixed hyperlipidemia 06/15/2017  . Inflammatory polyarthropathy ([place]) 06/15/2017  . Neuralgia and neuritis, unspecified 06/15/2017  . Major depressive disorder, recurrent, mild ([place]) 06/15/2017  . Other vitamin B12 deficiency anemias 06/15/2017  . Hypothyroidism 10/17/2008  . OVERWEIGHT/OBESITY 10/17/2008  . SLEEP APNEA, OBSTRUCTIVE 10/17/2008  . Essential hypertension 10/17/2008  . GALLSTONES 10/17/2008  . DYSPNEA 10/17/2008  . Obesity 10/17/2008   PCP:  Lavera Guise, MD Pharmacy:   [id number] - [place], [place] - [address] [place] [address] Phone: [phone] Fax: [facility] - [place], [place] [address] Phone: [phone] Fax: [phone]     Social Determinants of Health (SDOH) Interventions    Readmission Risk Interventions No flowsheet data found.

## 2019-03-30 NOTE — Consult Note (Signed)
Chief Complaint: Patient was seen in consultation today for cerebra arteriogram at the request of Dr Bruce Donath   Supervising Physician: [name]  Patient Status: [facility] - [facility]-pt  History of Present Illness: Christy Carter is a 85 y.o. female   Known to [place] R MCA revascularization 02/05/19 IMPRESSION: Endovascular revascularization of pre occlusive right middle cerebral artery stenosis with balloon angioplasty as described achieving a TICI 3 revascularization. Residual stenosis of approximately 50% following the balloon angioplasty.  Presented to [facility] 03/28/19 with left leg weakness; general weakness Takes Brilinta 90 mg BID And ASA 81 mg daily  MRA: IMPRESSION: 1. Recurrent Critical Stenosis/Near Occlusion of the Right MCA M1 segment which was angioplastied in September. Reconstituted right MCA bifurcation, with no right MCA branch occlusion identified. 2. Otherwise stable intracranial MRA including chronic left A1 occlusion, poor flow in the distal right vertebral artery, and severe right PCA stenosis. 3. Neck MRA redemonstrates chronic right Vertebral artery occlusion, with no significant left vertebral or cervical carotid artery Stenosis.  MR: IMPRESSION: 1. Small acute lacunar infarct in subcortical white matter of the right middle frontal gyrus. No associated hemorrhage or mass effect. 2. Expected evolution of the right corona radiata infarct since September. Otherwise stable chronic small vessel disease. Mild left occipital lobe superficial siderosis. 3. Chronically abnormal flow in the distal right vertebral artery.  To [facility] for evaluation and possible treatment Request now for cerebral arteriogram Dr Estanislado Pandy aware and approves procedure  Past Medical History:  Diagnosis Date   Arthritis    Carpal tunnel syndrome    Complication of anesthesia    Difficulty waking up. Believes it took 2 days to wake up after her surgery.   Depression     Hyperlipidemia    Hypertension    Shortness of breath    Stroke [facility])    Thyroid disease     Past Surgical History:  Procedure Laterality Date   APPENDECTOMY     BACK SURGERY     CHOLECYSTECTOMY     HERNIA REPAIR     IR CT HEAD LTD  02/05/2019   IR PTA INTRACRANIAL  02/05/2019   RADIOLOGY WITH ANESTHESIA N/A 02/05/2019   Procedure: CODE STROKE;  Surgeon: Radiologist, Medication, MD;  Location: [place];  Service: Radiology;  Laterality: N/A;    Allergies: Patient has no known allergies.  Medications: Prior to Admission medications   Medication Sig Start Date End Date Taking? Authorizing Provider  acetaminophen (TYLENOL) 325 MG tablet Take 2 tablets (650 mg total) by mouth every 4 (four) hours as needed for mild pain (or temp > 37.5 C (99.5 F)). 03/08/19  Yes Angiulli, Lavon Paganini, PA-C  allopurinol (ZYLOPRIM) 100 MG tablet Take 2 tabs at bed time for gout 01/12/19  Yes Ronnell Freshwater, NP  aspirin EC 81 MG EC tablet Take 1 tablet (81 mg total) by mouth daily. 02/11/19  Yes Donzetta Starch, NP  atorvastatin (LIPITOR) 40 MG tablet Take 1 tablet (40 mg total) by mouth daily at 6 PM. 03/08/19  Yes Angiulli, Lavon Paganini, PA-C  bisoprolol (ZEBETA) 5 MG tablet Take 0.5 tablets (2.5 mg total) by mouth daily. 03/08/19  Yes Angiulli, Lavon Paganini, PA-C  cloNIDine (CATAPRES - DOSED IN MG/24 HR) 0.1 mg/24hr patch Place 1 patch (0.1 mg total) onto the skin every Monday. 03/14/19  Yes Angiulli, Lavon Paganini, PA-C  colchicine 0.6 MG tablet Take 1 tablet (0.6 mg total) by mouth 2 (two) times daily. 12/06/18  Yes Ronnell Freshwater,  NP  Cyanocobalamin (B-12) 2500 MCG TABS Take 2,500 mcg by mouth daily as needed (For Lethargy).    Yes [provider]  diclofenac sodium (VOLTAREN) 1 % GEL Apply 4 g topically 4 (four) times daily. Patient taking differently: Apply 4 g topically 4 (four) times daily as needed (For pain).  03/08/19  Yes Angiulli, Lavon Paganini, PA-C  FLUoxetine (PROZAC) 10 MG capsule Take 1  capsule (10 mg total) by mouth daily. 03/08/19  Yes Angiulli, Lavon Paganini, PA-C  levothyroxine (SYNTHROID) 150 MCG tablet Take 1 tablet (150 mcg total) by mouth daily before breakfast. 03/08/19  Yes Angiulli, Lavon Paganini, PA-C  omeprazole (PRILOSEC) 40 MG capsule Take 1 capsule (40 mg total) by mouth daily. 03/08/19  Yes Angiulli, Lavon Paganini, PA-C  QUEtiapine (SEROQUEL) 25 MG tablet Take 1 tablet (25 mg total) by mouth at bedtime. 03/08/19  Yes Angiulli, Lavon Paganini, PA-C  ticagrelor (BRILINTA) 90 MG TABS tablet Take 1 tablet (90 mg total) by mouth 2 (two) times daily. 03/08/19  Yes Angiulli, Lavon Paganini, PA-C  VENTOLIN HFA 108 (90 Base) MCG/ACT inhaler TAKE 2 PUFFS BY MOUTH EVERY 6 HOURS AS NEEDED FOR WHEEZE OR SHORTNESS OF BREATH Patient taking differently: Inhale 2 puffs into the lungs every 6 (six) hours as needed for shortness of breath.  03/19/18  Yes Boscia, Greer Ee, NP  Vitamin D, Ergocalciferol, (DRISDOL) 1.25 MG (50000 UT) CAPS capsule Take 1 capsule (50,000 Units total) by mouth every 7 (seven) days. 02/02/19  Yes Kendell Bane, NP     Family History  Problem Relation Age of Onset   Heart disease Mother    Hypertension Mother    Diverticulitis Mother    Heart disease Father    Hypertension Father     Social History   Socioeconomic History   Marital status: Widowed    Spouse name: Not on file   Number of children: Not on file   Years of education: Not on file   Highest education level: Not on file  Occupational History   Not on file  Social Needs   Financial resource strain: Somewhat hard   Food insecurity    Worry: Sometimes true    Inability: Sometimes true   Transportation needs    Medical: No    Non-medical: No  Tobacco Use   Smoking status: Former Smoker    Types: Cigarettes    Quit date: 08/15/1992    Years since quitting: 26.6   Smokeless tobacco: Never Used  Substance and Sexual Activity   Alcohol use: No   Drug use: No   Sexual activity: Not  Currently  Lifestyle   Physical activity    Days per week: 0 days    Minutes per session: 0 min   Stress: Only a little  Relationships   Social connections    Talks on phone: More than three times a week    Gets together: More than three times a week    Attends religious service: More than 4 times per year    Active member of club or organization: Yes    Attends meetings of clubs or organizations: 1 to 4 times per year    Relationship status: Widowed  Other Topics Concern   Not on file  Social History Narrative   Not on file    Review of Systems: A 12 point ROS discussed and pertinent positives are indicated in the HPI above.  All other systems are negative.  Review of Systems  Constitutional: Positive  for activity change and fatigue. Negative for unexpected weight change.  Respiratory: Negative for cough and shortness of breath.   Gastrointestinal: Negative for abdominal pain.  Musculoskeletal: Positive for gait problem.  Neurological: Positive for weakness. Negative for dizziness, tremors, seizures, syncope, facial asymmetry, speech difficulty, light-headedness, numbness and headaches.  Psychiatric/Behavioral: Negative for confusion.    Vital Signs: BP (!) 112/97 (BP Location: Left Arm)    Pulse 71    Temp 98.3 F (36.8 C) (Oral)    Resp 20    SpO2 98%   Physical Exam Vitals signs reviewed.  HENT:     Head: Atraumatic.  Cardiovascular:     Rate and Rhythm: Normal rate and regular rhythm.     Heart sounds: Normal heart sounds.  Pulmonary:     Effort: Pulmonary effort is normal.     Breath sounds: Normal breath sounds.  Abdominal:     Tenderness: There is no abdominal tenderness.  Musculoskeletal: Normal range of motion.     Comments: Left lower extremity sl weaker than Rt  Skin:    General: Skin is warm and dry.  Neurological:     Mental Status: She is alert and oriented to person, place, and time.  Psychiatric:        Behavior: Behavior normal.      Imaging: Dg Chest 2 View  Result Date: 03/28/2019 CLINICAL DATA:  Generalized weakness. EXAM: CHEST - 2 VIEW COMPARISON:  02/17/2019 and CT chest 10/13/2018. FINDINGS: Patient is rotated. Heart is enlarged, stable. Thoracic aorta is calcified. Mild bibasilar streaky opacification. No dense airspace consolidation. Tiny left pleural effusion. Left hemidiaphragm is elevated. IMPRESSION: 1. Streaky bibasilar airspace opacification may be due to atelectasis. A viral or atypical pneumonia cannot be excluded. 2. Tiny left pleural effusion. 3.  Aortic atherosclerosis (ICD10-170.0). Electronically Signed   By: Lorin Picket M.D.   On: 03/28/2019 15:38   Ct Head Wo Contrast  Result Date: 03/28/2019 CLINICAL DATA:  Worsening weakness over the last 2 days. EXAM: CT HEAD WITHOUT CONTRAST CT CERVICAL SPINE WITHOUT CONTRAST TECHNIQUE: Multidetector CT imaging of the head and cervical spine was performed following the standard protocol without intravenous contrast. Multiplanar CT image reconstructions of the cervical spine were also generated. COMPARISON:  MRI 02/07/2019.  CT 02/05/2019 FINDINGS: CT HEAD FINDINGS Brain: Chronic atrophy and small-vessel ischemic change of the white matter. No sign of acute infarction, mass lesion, hemorrhage, obstructive hydrocephalus or extra-axial collection. Ventriculomegaly secondary to central atrophy. Vascular: There is atherosclerotic calcification of the major vessels at the base of the brain. Skull: Negative Sinuses/Orbits: Clear/normal Other: None CT CERVICAL SPINE FINDINGS Alignment: Normal Skull base and vertebrae: No fracture or primary bone lesion. Soft tissues and spinal canal: Negative Disc levels: Degenerative spondylosis from C3-4 through C7-T1 with disc space narrowing and small osteophytes. Facet osteoarthritis on the right at C3-4 and C4-5 and on the left at C4-5. Mild foraminal narrowing on the right at C3-4 and C4-5. No traumatic finding. Upper chest:  Negative Other: None IMPRESSION: Head CT: No acute or traumatic finding. Atrophy and chronic small-vessel ischemic changes. Cervical spine CT: No acute or traumatic finding. Degenerative spondylosis and facet osteoarthritis. Electronically Signed   By: Nelson Chimes M.D.   On: 03/28/2019 15:47   Ct Cervical Spine Wo Contrast  Result Date: 03/28/2019 CLINICAL DATA:  Worsening weakness over the last 2 days. EXAM: CT HEAD WITHOUT CONTRAST CT CERVICAL SPINE WITHOUT CONTRAST TECHNIQUE: Multidetector CT imaging of the head and cervical spine was performed following  the standard protocol without intravenous contrast. Multiplanar CT image reconstructions of the cervical spine were also generated. COMPARISON:  MRI 02/07/2019.  CT 02/05/2019 FINDINGS: CT HEAD FINDINGS Brain: Chronic atrophy and small-vessel ischemic change of the white matter. No sign of acute infarction, mass lesion, hemorrhage, obstructive hydrocephalus or extra-axial collection. Ventriculomegaly secondary to central atrophy. Vascular: There is atherosclerotic calcification of the major vessels at the base of the brain. Skull: Negative Sinuses/Orbits: Clear/normal Other: None CT CERVICAL SPINE FINDINGS Alignment: Normal Skull base and vertebrae: No fracture or primary bone lesion. Soft tissues and spinal canal: Negative Disc levels: Degenerative spondylosis from C3-4 through C7-T1 with disc space narrowing and small osteophytes. Facet osteoarthritis on the right at C3-4 and C4-5 and on the left at C4-5. Mild foraminal narrowing on the right at C3-4 and C4-5. No traumatic finding. Upper chest: Negative Other: None IMPRESSION: Head CT: No acute or traumatic finding. Atrophy and chronic small-vessel ischemic changes. Cervical spine CT: No acute or traumatic finding. Degenerative spondylosis and facet osteoarthritis. Electronically Signed   By: Nelson Chimes M.D.   On: 03/28/2019 15:47   Mr Angio Head Wo Contrast  Result Date: 03/29/2019 CLINICAL DATA:   85 year old female with increasing weakness, small acute right middle frontal gyrus infarct on brain MRI yesterday. History of multiple intracranial stenoses. Status post balloon angioplasty of critical right MCA stenosis on 02/05/2019. EXAM: MRA HEAD WITHOUT CONTRAST MRA NECK WITHOUT CONTRAST TECHNIQUE: Angiographic images of the Circle of Willis were obtained using MRA technique without intravenous contrast. Angiographic images of the neck were obtained using MRA technique without intravenous contrast. Carotid stenosis measurements (when applicable) are obtained utilizing NASCET criteria, using the distal internal carotid diameter as the denominator. COMPARISON:  Brain MRI 03/28/2019. Intracranial MRA 02/07/2019, CTA head and neck 02/05/2019, and earlier. FINDINGS: MRA NECK FINDINGS 3D time-of-flight images demonstrate antegrade flow signal in the bilateral cervical carotid arteries and the cervical left vertebral artery to the skull base. Absent flow signal in the cervical right vertebral artery compatible with known right Vertebral artery occlusion. Mild irregularity at both carotid bifurcations appears stable to that on the 02/05/2019 CTA and does not appear hemodynamically significant. No left vertebral artery stenosis identified in the neck. MRA HEAD FINDINGS Antegrade flow in both ICA siphons appears stable since 02/07/2019, with mild stenosis on the left. Patent ophthalmic and right posterior communicating artery origins. Patent carotid termini, MCA and ACA origins. A 5-6 millimeter segment of lost flow signal has developed in the proximal right MCA M1 (series 9, image 8), where moderate right MCA irregularity and stenosis was present on the post angioplasty MRA. However, flow is reconstituted at the right MCA bifurcation, and the visible right MCA branches appear to remain patent. Left MCA M1 segment, left MCA trifurcation and left MCA branches appear stable and within normal limits. Chronic occlusion of  the left ACA A1. The right A1, anterior communicating artery and visible ACA branches appears stable. Chronic poor flow in the distal right vertebral artery, probably partially reconstituted in a retrograde fashion. Stable antegrade flow signal in the distal left vertebral artery to the vertebrobasilar junction. Stable basilar artery with mild irregularity and mild proximal stenosis. Patent basilar tip, SCA and PCA origins. Right posterior communicating artery is present, the left is diminutive or absent. Bilateral PCA branches appear stable with moderate to severe right P2 segment stenosis. IMPRESSION: 1. Recurrent Critical Stenosis/Near Occlusion of the Right MCA M1 segment which was angioplastied in September. Reconstituted right MCA bifurcation, with no right  MCA branch occlusion identified. 2. Otherwise stable intracranial MRA including chronic left A1 occlusion, poor flow in the distal right vertebral artery, and severe right PCA stenosis. 3. Neck MRA redemonstrates chronic right Vertebral artery occlusion, with no significant left vertebral or cervical carotid artery stenosis. Salient findings discussed by telephone with PA Rufina Falco on 03/29/2019 at 0242 hours. Electronically Signed   By: Genevie Ann M.D.   On: 03/29/2019 02:48   Mr Angio Neck Wo Contrast  Result Date: 03/29/2019 CLINICAL DATA:  85 year old female with increasing weakness, small acute right middle frontal gyrus infarct on brain MRI yesterday. History of multiple intracranial stenoses. Status post balloon angioplasty of critical right MCA stenosis on 02/05/2019. EXAM: MRA HEAD WITHOUT CONTRAST MRA NECK WITHOUT CONTRAST TECHNIQUE: Angiographic images of the Circle of Willis were obtained using MRA technique without intravenous contrast. Angiographic images of the neck were obtained using MRA technique without intravenous contrast. Carotid stenosis measurements (when applicable) are obtained utilizing NASCET criteria, using the distal  internal carotid diameter as the denominator. COMPARISON:  Brain MRI 03/28/2019. Intracranial MRA 02/07/2019, CTA head and neck 02/05/2019, and earlier. FINDINGS: MRA NECK FINDINGS 3D time-of-flight images demonstrate antegrade flow signal in the bilateral cervical carotid arteries and the cervical left vertebral artery to the skull base. Absent flow signal in the cervical right vertebral artery compatible with known right Vertebral artery occlusion. Mild irregularity at both carotid bifurcations appears stable to that on the 02/05/2019 CTA and does not appear hemodynamically significant. No left vertebral artery stenosis identified in the neck. MRA HEAD FINDINGS Antegrade flow in both ICA siphons appears stable since 02/07/2019, with mild stenosis on the left. Patent ophthalmic and right posterior communicating artery origins. Patent carotid termini, MCA and ACA origins. A 5-6 millimeter segment of lost flow signal has developed in the proximal right MCA M1 (series 9, image 8), where moderate right MCA irregularity and stenosis was present on the post angioplasty MRA. However, flow is reconstituted at the right MCA bifurcation, and the visible right MCA branches appear to remain patent. Left MCA M1 segment, left MCA trifurcation and left MCA branches appear stable and within normal limits. Chronic occlusion of the left ACA A1. The right A1, anterior communicating artery and visible ACA branches appears stable. Chronic poor flow in the distal right vertebral artery, probably partially reconstituted in a retrograde fashion. Stable antegrade flow signal in the distal left vertebral artery to the vertebrobasilar junction. Stable basilar artery with mild irregularity and mild proximal stenosis. Patent basilar tip, SCA and PCA origins. Right posterior communicating artery is present, the left is diminutive or absent. Bilateral PCA branches appear stable with moderate to severe right P2 segment stenosis. IMPRESSION: 1.  Recurrent Critical Stenosis/Near Occlusion of the Right MCA M1 segment which was angioplastied in September. Reconstituted right MCA bifurcation, with no right MCA branch occlusion identified. 2. Otherwise stable intracranial MRA including chronic left A1 occlusion, poor flow in the distal right vertebral artery, and severe right PCA stenosis. 3. Neck MRA redemonstrates chronic right Vertebral artery occlusion, with no significant left vertebral or cervical carotid artery stenosis. Salient findings discussed by telephone with PA Rufina Falco on 03/29/2019 at 0242 hours. Electronically Signed   By: Genevie Ann M.D.   On: 03/29/2019 02:48   Mr Brain Wo Contrast  Result Date: 03/28/2019 CLINICAL DATA:  85 year old female with increasing weakness for 2 days. Status post stroke in September. History of multiple severe intracranial stenoses EXAM: MRI HEAD WITHOUT CONTRAST TECHNIQUE: Multiplanar, multiecho pulse  sequences of the brain and surrounding structures were obtained without intravenous contrast. COMPARISON:  Brain MRI and intracranial MRA 02/07/2019 and earlier. FINDINGS: Brain: Expected evolution of the right corona radiata infarct since September. Small new 4-5 millimeter area of right middle frontal gyrus subcortical white matter restricted diffusion on series 9, image 34 and series 22, image 21. T2 and FLAIR hyperintensity with no hemorrhage or mass effect. Developing cystic encephalomalacia in the right corona radiata. No cortical encephalomalacia identified. Small chronic infarcts in the right PICA territory. Patchy and confluent bilateral periventricular white matter T2 and FLAIR hyperintensity. Chronic microhemorrhages in the right periatrial white matter. Mild superficial siderosis along the left lateral occipital lobe is also stable on series 17, image 24. No midline shift, mass effect, evidence of mass lesion, ventriculomegaly, extra-axial collection or acute intracranial hemorrhage. Cervicomedullary  junction and pituitary are within normal limits. Vascular: Major intracranial vascular flow voids are stable, with evidence of decreased flow in the distal right vertebral artery. Skull and upper cervical spine: Negative visible cervical spine. Visualized bone marrow signal is within normal limits. Sinuses/Orbits: Stable and negative. Other: Mastoids remain clear. Scalp and face soft tissues appear negative. IMPRESSION: 1. Small acute lacunar infarct in subcortical white matter of the right middle frontal gyrus. No associated hemorrhage or mass effect. 2. Expected evolution of the right corona radiata infarct since September. Otherwise stable chronic small vessel disease. Mild left occipital lobe superficial siderosis. 3. Chronically abnormal flow in the distal right vertebral artery. Electronically Signed   By: Genevie Ann M.D.   On: 03/28/2019 19:40    Labs:  CBC: Recent Labs    03/07/19 1259 03/28/19 1457 03/29/19 0806 03/29/19 1813  WBC 6.1 9.5 7.5 8.1  HGB 13.1 12.6 11.7* 13.0  HCT 42.5 40.1 37.2 39.4  PLT 210 277 225 227    COAGS: Recent Labs    03/28/19 2114  INR 1.0  APTT 27    BMP: Recent Labs    03/04/19 0510 03/07/19 1259 03/28/19 1457 03/29/19 0806 03/29/19 1813  NA 141 140 140 139  --   K 3.8 4.3 4.2 3.8  --   CL 110 104 103 105  --   CO2 22 26 25 26   --   GLUCOSE 81 92 102* 104*  --   BUN 13 17 21 17   --   CALCIUM 8.7* 9.3 9.6 9.1  --   CREATININE 1.00 1.23* 0.86 0.71 0.77  GFRNONAA 52* 41* >60 >60 >60  GFRAA >60 47* >60 >60 >60    LIVER FUNCTION TESTS: Recent Labs    11/25/18 1046 02/14/19 0748 03/28/19 1457 03/29/19 0806  BILITOT 0.2 0.9 1.0 0.8  AST 14 23 21  12*  ALT 12 20 10 8   ALKPHOS 90 69 97 79  PROT 6.5 6.8 7.2 6.4*  ALBUMIN 3.9 3.3* 3.7 3.3*    TUMOR MARKERS: No results for input(s): AFPTM, CEA, CA199, CHROMGRNA in the last 8760 hours.  Assessment and Plan:  R MCA angioplasty/revascularization TICI 3 02/05/19 New weakness LLE and  general weakness last week-- New smalll infarct and restenosis of R MCA site. Now scheduled for diagnostic cerebral arteriogram today  Risks and benefits of cerebral angiogram with intervention were discussed with the patient including, but not limited to bleeding, infection, vascular injury, contrast induced renal failure, stroke or even death. This interventional procedure involves the use of X-rays and because of the nature of the planned procedure, it is possible that we will have prolonged use of  X-ray fluoroscopy. Potential radiation risks to you include (but are not limited to) the following: - A slightly elevated risk for cancer  several years later in life. This risk is typically less than 0.5% percent. This risk is low in comparison to the normal incidence of human cancer, which is 33% for women and 50% for men according to the [place]. - Radiation induced injury can include skin redness, resembling a rash, tissue breakdown / ulcers and hair loss (which can be temporary or permanent).   The likelihood of either of these occurring depends on the difficulty of the procedure and whether you are sensitive to radiation due to previous procedures, disease, or genetic conditions.  IF your procedure requires a prolonged use of radiation, you will be notified and given written instructions for further action.  It is your responsibility to monitor the irradiated area for the 2 weeks following the procedure and to notify your physician if you are concerned that you have suffered a radiation induced injury.    All of the patient's questions were answered, patient is agreeable to proceed. Consent signed and in chart.  Thank you for this interesting consult.  I greatly enjoyed meeting Christy Carter and look forward to participating in their care.  A copy of this report was sent to the requesting provider on this date.  Electronically Signed: Lavonia Drafts, PA-C 03/30/2019, 9:15  AM   I spent a total of 20 Minutes    in face to face in clinical consultation, greater than 50% of which was counseling/coordinating care for cerebral arteriogram

## 2019-03-30 NOTE — Evaluation (Signed)
Speech Language Pathology Evaluation Patient Details Name: Christy Carter MRN: [id number] DOB: December 01, 1933 Today's Date: 03/30/2019 Time: 0802-0830 SLP Time Calculation (min) (ACUTE ONLY): 28 min  Problem List:  Patient Active Problem List   Diagnosis Date Noted  . CVA (cerebral vascular accident) ([place]) 03/29/2019  . Ischemic stroke ([place]) 03/29/2019  . Acute CVA (cerebrovascular accident) ([place]) 03/28/2019  . Sleep disturbance   . Benign essential HTN   . Acute lower UTI   . Pseudodementia   . Labile blood pressure   . Metabolic encephalopathy   . Hyperglycemia   . Encephalopathy   . AKI (acute kidney injury) ([place])   . Leukocytosis   . Dysphagia, post-stroke   . Acute bilat watershed infarction [facility]) 02/11/2019  . Agitation requiring sedation protocol 02/09/2019  . Dysphagia 02/09/2019  . Stroke (cerebrum) ([place]) - R watershed d/t large vessel dz & small L temp lobe & R cerebellar infarcts 02/05/2019  . Middle cerebral artery stenosis, right 02/05/2019  . Acute respiratory failure ([place]) 02/03/2019  . Acute respiratory failure with hypoxemia ([place]) 02/03/2019  . Abnormal renal function 01/19/2019  . Pain in left foot 12/19/2018  . Hematuria 08/02/2018  . Influenza A 03/02/2018  . Urinary tract infection without hematuria 03/02/2018  . Sepsis ([place]) 02/11/2018  . CAP (community acquired pneumonia) 02/11/2018  . CKD (chronic kidney disease), stage III 02/11/2018  . Memory loss, short term 02/10/2018  . Flu vaccine need 02/10/2018  . Chest pain 02/03/2018  . Acute upper respiratory infection 02/03/2018  . Gastroesophageal reflux disease without esophagitis 02/03/2018  . Encounter for general adult medical examination with abnormal findings 01/13/2018  . Chronic low back pain 01/13/2018  . Need for vaccination against Streptococcus pneumoniae using pneumococcal conjugate vaccine 7 01/13/2018  . Dysuria 01/13/2018  . 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  Pelvic pain 10/08/2017  . Gout 06/15/2017  . Mixed hyperlipidemia 06/15/2017  . Inflammatory polyarthropathy ([place]) 06/15/2017  . Neuralgia and neuritis, unspecified 06/15/2017  . Major depressive disorder, recurrent, mild ([place]) 06/15/2017  . Other vitamin B12 deficiency anemias 06/15/2017  . Hypothyroidism 10/17/2008  . OVERWEIGHT/OBESITY 10/17/2008  . SLEEP APNEA, OBSTRUCTIVE 10/17/2008  . Essential hypertension 10/17/2008  . GALLSTONES 10/17/2008  . DYSPNEA 10/17/2008  . Obesity 10/17/2008   Past Medical History:  Past Medical History:  Diagnosis Date  . Arthritis   . Carpal tunnel syndrome   . Complication of anesthesia    Difficulty waking up. Believes it took 2 days to wake up after her surgery.  . Depression   . Hyperlipidemia   . Hypertension   . Shortness of breath   . Stroke ([place])   . Thyroid disease    Past Surgical History:  Past Surgical History:  Procedure Laterality Date  . APPENDECTOMY    . BACK SURGERY    . CHOLECYSTECTOMY    . HERNIA REPAIR    . IR CT HEAD LTD  02/05/2019  . IR PTA INTRACRANIAL  02/05/2019  . RADIOLOGY WITH ANESTHESIA N/A 02/05/2019   Procedure: CODE STROKE;  Surgeon: Radiologist, Medication, MD;  Location: [facility];  Service: Radiology;  Laterality: N/A;   HPI:  85 y.o. female with medical history significant of hypertension, gout, hyperlipidemia, arthritis, hypothyroidism, recent ischemic stroke s/p and angioplasty presented with complaint of increased confusion, unsteady gait and increase left-sided weakness for 1 week.  Stroke work-up was done at [facility].  MRI brain showed small acute lacunar infarct in the subcortical white matter of the right  middle frontal gyrus.  MRI head and neck also showed recurrent critical stenosis/near occlusion of the right MCA M1 segment which was angioplastied in September 2020.  Patient was discussed by admitting physician with neurologist on-call Dr. Amie [place] who recommended transfer to [facility] for  possible endovascular evaluation to assess if she would be a candidate for repeat angioplasty versus stent.   Assessment / Plan / Recommendation Clinical Impression  Patient demonstrates severe cognitive impairments that appear exacerbated since discharge from CIR (per chart review), however, family not present to confirm baseline level of cognitive functioning. Upon arrival, patient was yelling out into the hallway with her arm and linens covered in blood secondary to patient pulling out her IV along with several other lines/leads. RN notified and came to room to assess. Patient was extremely confused and required total A for orientation to place, date and situation throughout session. Patient followed 1-step commands during bed mobility but required frequent redirection to task due to decreased sustained attention and problem solving. However, patient did not demonstrate intellectual awareness of confusion by frequently stating, "I am so mixed up right now." Patient's language appeared [facility] without evidence of word-finding and was 100% intelligible at the sentence level. Patient would benefit from skilled SLP intervention to maximize her cognitive functioning prior to discharge.     SLP Assessment  SLP Recommendation/Assessment: Patient needs continued Speech Lanaguage Pathology Services SLP Visit Diagnosis: Attention and concentration deficit Attention and concentration deficit following: Cerebral infarction    Follow Up Recommendations  24 hour supervision/assistance;Skilled Nursing facility vs Inpatient Rehab   Frequency and Duration min 2x/week  2 weeks      SLP Evaluation Cognition  Overall Cognitive Status: History of cognitive impairments - at baseline Arousal/Alertness: Awake/alert Orientation Level: Oriented to person;Disoriented to situation;Disoriented to time;Disoriented to place Attention: Sustained Focused Attention: Appears intact Sustained Attention: Impaired Sustained  Attention Impairment: Verbal basic;Functional basic Memory: Impaired Memory Impairment: Decreased recall of new information;Decreased short term memory;Storage deficit Decreased Short Term Memory: Verbal basic;Functional basic Awareness: Impaired Awareness Impairment: Emergent impairment Problem Solving: Impaired Problem Solving Impairment: Functional basic Sequencing: Impaired Safety/Judgment: Impaired       Comprehension  Auditory Comprehension Overall Auditory Comprehension: Appears within functional limits for tasks assessed Visual Recognition/Discrimination Discrimination: Not tested Reading Comprehension Reading Status: Not tested    Expression Expression Primary Mode of Expression: Verbal Verbal Expression Overall Verbal Expression: Appears within functional limits for tasks assessed Written Expression Dominant Hand: Right Written Expression: Not tested   Oral / Motor  Oral Motor/Sensory Function Overall Oral Motor/Sensory Function: Within functional limits(not formally assessed) Motor Speech Overall Motor Speech: Appears within functional limits for tasks assessed Respiration: Within functional limits Resonance: Within functional limits Articulation: Within functional limitis Intelligibility: Intelligible Motor Planning: Witnin functional limits   GO                    Mashal Slavick 03/30/2019, 8:43 AM  [place], [place], [place]

## 2019-03-30 NOTE — Evaluation (Signed)
Physical Therapy Evaluation Patient Details Name: Christy Carter MRN: [id number] DOB: 1933-11-14 Today's Date: 03/30/2019   History of Present Illness  85 y.o. female with medical history significant of hypertension, gout, hyperlipidemia, arthritis, hypothyroidism, recent ischemic stroke s/p and angioplasty presented with complaint of increased confusion, unsteady gait and increase left-sided weakness for 1 week.  Stroke work-up was done at [facility].  MRI brain showed small acute lacunar infarct in the subcortical white matter of the right middle frontal gyrus.  MRI head and neck also showed recurrent critical stenosis/near occlusion of the right MCA M1 segment which was angioplastied in September 2020.   Clinical Impression  Pt admitted with above diagnosis. Pt presents with decreased functional mobility secondary to weakness, balance impairments, and decreased cognition. Requiring two person mod-maximal assist and walker for performing basic stand pivot transfers. Per pt, prior to admission, pt living with her daughter and modI with household ambulation using walker.Pt currently with functional limitations due to the deficits listed below (see PT Problem List). Pt will benefit from skilled PT to increase their independence and safety with mobility to allow discharge to the venue listed below.       Follow Up Recommendations SNF;Supervision/Assistance - 24 hour    Equipment Recommendations  None recommended by PT    Recommendations for Other Services       Precautions / Restrictions Precautions Precautions: Fall Precaution Comments: L inattention, left hemiparesis Restrictions Weight Bearing Restrictions: No      Mobility  Bed Mobility Overal bed mobility: Needs Assistance Bed Mobility: Supine to Sit;Sit to Supine     Supine to sit: HOB elevated;Min assist Sit to supine: Min guard   General bed mobility comments: Pt required min A and VCs to scoot hips to come to sitting EOB.  Required min guard A to return to supine  Transfers Overall transfer level: Needs assistance Equipment used: Rolling walker (2 wheeled);2 person hand held assist Transfers: Sit to/from [facility] Sit to Stand: Max assist;+2 physical assistance;+2 safety/equipment Stand pivot transfers: Mod assist;+2 physical assistance;+2 safety/equipment       General transfer comment: Pt required max A +2 to power up to stand and gain balance, with cues for rocking forward to gain momentum. required mod A +2 to pivot to [facility], cues for stepping initiation and execution  Ambulation/Gait                Stairs            Wheelchair Mobility    Modified Rankin (Stroke Patients Only) Modified Rankin (Stroke Patients Only) Pre-Morbid Rankin Score: Moderate disability Modified Rankin: Moderately severe disability     Balance Overall balance assessment: Needs assistance Sitting-balance support: Single extremity supported;Feet unsupported Sitting balance-Leahy Scale: Fair     Standing balance support: Bilateral upper extremity supported;During functional activity Standing balance-Leahy Scale: Poor Standing balance comment: Pt required BUE support on RW and +2 assist to maintain balance                             Pertinent Vitals/Pain Pain Assessment: Faces Faces Pain Scale: Hurts little more Pain Location: right foot with weightbearing, tender to palpation along dorsal aspect Pain Descriptors / Indicators: Grimacing;Sore Pain Intervention(s): Monitored during session    Home Living Family/patient expects to be discharged to:: Skilled nursing facility Living Arrangements: Children  Prior Function Level of Independence: Needs assistance   Gait / Transfers Assistance Needed: uses RW for limited household mobility  ADL's / Homemaking Assistance Needed: Pt required assist from daughter for BADLs        Hand Dominance    Dominant Hand: Right    Extremity/Trunk Assessment   Upper Extremity Assessment Upper Extremity Assessment: LUE deficits/detail LUE Deficits / Details: Pt with decreased AROM and strength of LUE    Lower Extremity Assessment Lower Extremity Assessment: Generalized weakness    Cervical / Trunk Assessment Cervical / Trunk Assessment: Normal  Communication   Communication: No difficulties  Cognition Arousal/Alertness: Awake/alert Behavior During Therapy: WFL for tasks assessed/performed Overall Cognitive Status: Impaired/Different from baseline Area of Impairment: Attention;Memory;Following commands;Safety/judgement;Awareness;Problem solving                   Current Attention Level: Sustained Memory: Decreased short-term memory Following Commands: Follows one step commands inconsistently;Follows one step commands with increased time Safety/Judgement: Decreased awareness of safety;Decreased awareness of deficits Awareness: Intellectual Problem Solving: Slow processing;Decreased initiation;Difficulty sequencing;Requires verbal cues;Requires tactile cues General Comments: Pt demonstrated poor awareness of safety and deficits, and required increased time for simple commands. Pt had incontinence of bladder each time she attempted to stand. Pt required mod VCs throughout to sequence tasks.      General Comments General comments (skin integrity, edema, etc.): pt VSS throughout    Exercises     Assessment/Plan    PT Assessment Patient needs continued PT services  PT Problem List Decreased strength;Decreased activity tolerance;Decreased balance;Decreased mobility;Decreased coordination;Pain       PT Treatment Interventions DME instruction;Gait training;Therapeutic activities;Therapeutic exercise;Balance training;Patient/family education    PT Goals (Current goals can be found in the Care Plan section)  Acute Rehab PT Goals Patient Stated Goal: go home PT Goal  Formulation: With patient Time For Goal Achievement: 04/13/19 Potential to Achieve Goals: Fair    Frequency Min 3X/week   Barriers to discharge        Co-evaluation PT/OT/SLP Co-Evaluation/Treatment: Yes Reason for Co-Treatment: Complexity of the patient's impairments (multi-system involvement);Necessary to address cognition/behavior during functional activity;For patient/therapist safety;To address functional/ADL transfers PT goals addressed during session: Mobility/safety with mobility OT goals addressed during session: ADL's and self-care       AM-PAC PT "6 Clicks" Mobility  Outcome Measure Help needed turning from your back to your side while in a flat bed without using bedrails?: A Little Help needed moving from lying on your back to sitting on the side of a flat bed without using bedrails?: A Little Help needed moving to and from a bed to a chair (including a wheelchair)?: A Lot Help needed standing up from a chair using your arms (e.g., wheelchair or bedside chair)?: Total Help needed to walk in hospital room?: A Lot Help needed climbing 3-5 steps with a railing? : Total 6 Click Score: 12    End of Session Equipment Utilized During Treatment: Gait belt Activity Tolerance: Patient tolerated treatment well Patient left: with call bell/phone within reach;in bed;with bed alarm set Nurse Communication: Mobility status PT Visit Diagnosis: Muscle weakness (generalized) (M62.81);Difficulty in walking, not elsewhere classified (R26.2) Pain - Right/Left: Right Pain - part of body: Ankle and joints of foot    Time: 0927-1008 PT Time Calculation (min) (ACUTE ONLY): 41 min   Charges:   PT Evaluation $PT Eval Moderate Complexity: 1 Mod PT Treatments $Therapeutic Activity: 8-22 mins        Ellamae Sia, PT, DPT Acute Rehabilitation  Services Pager [phone] Office [phone]   Willy Eddy 03/30/2019, 12:27 PM

## 2019-03-30 NOTE — Progress Notes (Signed)
STROKE TEAM PROGRESS NOTE   INTERVAL HISTORY Pt lying in bed, has both hand mitten on. As per RN, she was agitated this am, pulled her IV and caused bleeding all over. She, on my exam, fully orientated now, asking for lunch. RN will assist her with lunch. She supposed to have IR today but postponed due to emergency IR cases.    Vitals:   03/30/19 0347 03/30/19 0500 03/30/19 1115 03/30/19 1154  BP: (!) 112/97  (!) 166/57   Pulse: 87 71 85   Resp: 18 20 (!) 25   Temp: 98.3 F (36.8 C)   98.6 F (37 C)  TempSrc: Oral   Oral  SpO2: 93% 98% 98%     CBC:  Recent Labs  Lab 03/28/19 1457 03/29/19 0806 03/29/19 1813  WBC 9.5 7.5 8.1  NEUTROABS 6.9  --   --   HGB 12.6 11.7* 13.0  HCT 40.1 37.2 39.4  MCV 94.1 93.7 92.1  PLT 277 225 [id number]    Basic Metabolic Panel:  Recent Labs  Lab 03/28/19 1457 03/29/19 0806 03/29/19 1813  NA 140 139  --   K 4.2 3.8  --   CL 103 105  --   CO2 25 26  --   GLUCOSE 102* 104*  --   BUN 21 17  --   CREATININE 0.86 0.71 0.77  CALCIUM 9.6 9.1  --    Lipid Panel:     Component Value Date/Time   CHOL 132 03/29/2019 0806   CHOL 98 09/07/2013 0358   TRIG 127 03/29/2019 0806   TRIG 63 09/07/2013 0358   HDL 41 03/29/2019 0806   HDL 43 09/07/2013 0358   CHOLHDL 3.2 03/29/2019 0806   VLDL 25 03/29/2019 0806   VLDL 13 09/07/2013 0358   LDLCALC 66 03/29/2019 0806   LDLCALC 42 09/07/2013 0358   HgbA1c:  Lab Results  Component Value Date   HGBA1C 5.5 03/29/2019   Urine Drug Screen: No results found for: LABOPIA, COCAINSCRNUR, LABBENZ, AMPHETMU, THCU, LABBARB  Alcohol Level No results found for: [facility]  IMAGING Mr Angio Head Wo Contrast  Result Date: 03/29/2019 CLINICAL DATA:  85 year old female with increasing weakness, small acute right middle frontal gyrus infarct on brain MRI yesterday. History of multiple intracranial stenoses. Status post balloon angioplasty of critical right MCA stenosis on 02/05/2019. EXAM: MRA HEAD WITHOUT CONTRAST MRA  NECK WITHOUT CONTRAST TECHNIQUE: Angiographic images of the Circle of Willis were obtained using MRA technique without intravenous contrast. Angiographic images of the neck were obtained using MRA technique without intravenous contrast. Carotid stenosis measurements (when applicable) are obtained utilizing NASCET criteria, using the distal internal carotid diameter as the denominator. COMPARISON:  Brain MRI 03/28/2019. Intracranial MRA 02/07/2019, CTA head and neck 02/05/2019, and earlier. FINDINGS: MRA NECK FINDINGS 3D time-of-flight images demonstrate antegrade flow signal in the bilateral cervical carotid arteries and the cervical left vertebral artery to the skull base. Absent flow signal in the cervical right vertebral artery compatible with known right Vertebral artery occlusion. Mild irregularity at both carotid bifurcations appears stable to that on the 02/05/2019 CTA and does not appear hemodynamically significant. No left vertebral artery stenosis identified in the neck. MRA HEAD FINDINGS Antegrade flow in both ICA siphons appears stable since 02/07/2019, with mild stenosis on the left. Patent ophthalmic and right posterior communicating artery origins. Patent carotid termini, MCA and ACA origins. A 5-6 millimeter segment of lost flow signal has developed in the proximal right MCA M1 (series 9, image  8), where moderate right MCA irregularity and stenosis was present on the post angioplasty MRA. However, flow is reconstituted at the right MCA bifurcation, and the visible right MCA branches appear to remain patent. Left MCA M1 segment, left MCA trifurcation and left MCA branches appear stable and within normal limits. Chronic occlusion of the left ACA A1. The right A1, anterior communicating artery and visible ACA branches appears stable. Chronic poor flow in the distal right vertebral artery, probably partially reconstituted in a retrograde fashion. Stable antegrade flow signal in the distal left vertebral  artery to the vertebrobasilar junction. Stable basilar artery with mild irregularity and mild proximal stenosis. Patent basilar tip, SCA and PCA origins. Right posterior communicating artery is present, the left is diminutive or absent. Bilateral PCA branches appear stable with moderate to severe right P2 segment stenosis. IMPRESSION: 1. Recurrent Critical Stenosis/Near Occlusion of the Right MCA M1 segment which was angioplastied in September. Reconstituted right MCA bifurcation, with no right MCA branch occlusion identified. 2. Otherwise stable intracranial MRA including chronic left A1 occlusion, poor flow in the distal right vertebral artery, and severe right PCA stenosis. 3. Neck MRA redemonstrates chronic right Vertebral artery occlusion, with no significant left vertebral or cervical carotid artery stenosis. Salient findings discussed by telephone with PA Rufina Falco on 03/29/2019 at 0242 hours. Electronically Signed   By: Genevie Ann M.D.   On: 03/29/2019 02:48   Mr Angio Neck Wo Contrast  Result Date: 03/29/2019 CLINICAL DATA:  85 year old female with increasing weakness, small acute right middle frontal gyrus infarct on brain MRI yesterday. History of multiple intracranial stenoses. Status post balloon angioplasty of critical right MCA stenosis on 02/05/2019. EXAM: MRA HEAD WITHOUT CONTRAST MRA NECK WITHOUT CONTRAST TECHNIQUE: Angiographic images of the Circle of Willis were obtained using MRA technique without intravenous contrast. Angiographic images of the neck were obtained using MRA technique without intravenous contrast. Carotid stenosis measurements (when applicable) are obtained utilizing NASCET criteria, using the distal internal carotid diameter as the denominator. COMPARISON:  Brain MRI 03/28/2019. Intracranial MRA 02/07/2019, CTA head and neck 02/05/2019, and earlier. FINDINGS: MRA NECK FINDINGS 3D time-of-flight images demonstrate antegrade flow signal in the bilateral cervical carotid  arteries and the cervical left vertebral artery to the skull base. Absent flow signal in the cervical right vertebral artery compatible with known right Vertebral artery occlusion. Mild irregularity at both carotid bifurcations appears stable to that on the 02/05/2019 CTA and does not appear hemodynamically significant. No left vertebral artery stenosis identified in the neck. MRA HEAD FINDINGS Antegrade flow in both ICA siphons appears stable since 02/07/2019, with mild stenosis on the left. Patent ophthalmic and right posterior communicating artery origins. Patent carotid termini, MCA and ACA origins. A 5-6 millimeter segment of lost flow signal has developed in the proximal right MCA M1 (series 9, image 8), where moderate right MCA irregularity and stenosis was present on the post angioplasty MRA. However, flow is reconstituted at the right MCA bifurcation, and the visible right MCA branches appear to remain patent. Left MCA M1 segment, left MCA trifurcation and left MCA branches appear stable and within normal limits. Chronic occlusion of the left ACA A1. The right A1, anterior communicating artery and visible ACA branches appears stable. Chronic poor flow in the distal right vertebral artery, probably partially reconstituted in a retrograde fashion. Stable antegrade flow signal in the distal left vertebral artery to the vertebrobasilar junction. Stable basilar artery with mild irregularity and mild proximal stenosis. Patent basilar tip, SCA and  PCA origins. Right posterior communicating artery is present, the left is diminutive or absent. Bilateral PCA branches appear stable with moderate to severe right P2 segment stenosis. IMPRESSION: 1. Recurrent Critical Stenosis/Near Occlusion of the Right MCA M1 segment which was angioplastied in September. Reconstituted right MCA bifurcation, with no right MCA branch occlusion identified. 2. Otherwise stable intracranial MRA including chronic left A1 occlusion, poor flow  in the distal right vertebral artery, and severe right PCA stenosis. 3. Neck MRA redemonstrates chronic right Vertebral artery occlusion, with no significant left vertebral or cervical carotid artery stenosis. Salient findings discussed by telephone with PA Rufina Falco on 03/29/2019 at 0242 hours. Electronically Signed   By: Genevie Ann M.D.   On: 03/29/2019 02:48   Mr Brain Wo Contrast  Result Date: 03/28/2019 CLINICAL DATA:  85 year old female with increasing weakness for 2 days. Status post stroke in September. History of multiple severe intracranial stenoses EXAM: MRI HEAD WITHOUT CONTRAST TECHNIQUE: Multiplanar, multiecho pulse sequences of the brain and surrounding structures were obtained without intravenous contrast. COMPARISON:  Brain MRI and intracranial MRA 02/07/2019 and earlier. FINDINGS: Brain: Expected evolution of the right corona radiata infarct since September. Small new 4-5 millimeter area of right middle frontal gyrus subcortical white matter restricted diffusion on series 9, image 34 and series 22, image 21. T2 and FLAIR hyperintensity with no hemorrhage or mass effect. Developing cystic encephalomalacia in the right corona radiata. No cortical encephalomalacia identified. Small chronic infarcts in the right PICA territory. Patchy and confluent bilateral periventricular white matter T2 and FLAIR hyperintensity. Chronic microhemorrhages in the right periatrial white matter. Mild superficial siderosis along the left lateral occipital lobe is also stable on series 17, image 24. No midline shift, mass effect, evidence of mass lesion, ventriculomegaly, extra-axial collection or acute intracranial hemorrhage. Cervicomedullary junction and pituitary are within normal limits. Vascular: Major intracranial vascular flow voids are stable, with evidence of decreased flow in the distal right vertebral artery. Skull and upper cervical spine: Negative visible cervical spine. Visualized bone marrow signal  is within normal limits. Sinuses/Orbits: Stable and negative. Other: Mastoids remain clear. Scalp and face soft tissues appear negative. IMPRESSION: 1. Small acute lacunar infarct in subcortical white matter of the right middle frontal gyrus. No associated hemorrhage or mass effect. 2. Expected evolution of the right corona radiata infarct since September. Otherwise stable chronic small vessel disease. Mild left occipital lobe superficial siderosis. 3. Chronically abnormal flow in the distal right vertebral artery. Electronically Signed   By: Genevie Ann M.D.   On: 03/28/2019 19:40    PHYSICAL EXAM  Temp:  [98 F (36.7 C)-98.6 F (37 C)] 98.6 F (37 C) (11/18 1154) Pulse Rate:  [66-87] 85 (11/18 1115) Resp:  [17-25] 25 (11/18 1115) BP: (112-187)/(42-97) 166/57 (11/18 1115) SpO2:  [92 %-98 %] 98 % (11/18 1115)  General - Well nourished, well developed, in no apparent distress.  Ophthalmologic - fundi not visualized due to noncooperation.  Cardiovascular - Regular rhythm and rate.  Mental Status -  Level of arousal and orientation to year, place, and person were intact, but not to month. Language including expression, naming, repetition, comprehension was assessed and found intact. Fund of Knowledge was assessed and was impaired, does not remember previous presidents  Cranial Nerves II - XII - II - Visual field intact OU. III, IV, VI - Extraocular movements intact. V - Facial sensation intact bilaterally. VII - Facial movement intact bilaterally. VIII - Hearing & vestibular intact bilaterally. X - Palate elevates  symmetrically. XI - Chin turning & shoulder shrug intact bilaterally. XII - Tongue protrusion intact.  Motor Strength - The patient's strength was normal in right upper and lower extremities, however, left UE and LE 4+/5. Bulk was normal and fasciculations were absent.   Motor Tone - Muscle tone was assessed at the neck and appendages and was normal.  Reflexes - The patient's  reflexes were symmetrical in all extremities and she had no pathological reflexes.  Sensory - Light touch, temperature/pinprick were assessed and were symmetrical.    Coordination - The patient had normal movements in the right hand with no ataxia or dysmetria, left FTN mild dysmetria.  Tremor was absent.  Gait and Station - deferred.   ASSESSMENT/PLAN Ms. JANEKA MONOHAN is a 85 y.o. female with history of R MCA infarct s/p R M2 angioplasty in Sept 2020 presenting to [facility] 03/28/2019 with worsening L HP for the past 1 week. MRI w/ new R MCA infarct with recurrent critical stenosis/near occlusion R M1 on MRA. Transferred to [employer].   Stroke:   Small subcortical white matter R frontal lobe infarct in setting of recurrent critical R M1 stenosis/near occlusion   CT head no acute abnormality. Small vessel disease. Atrophy.   CT CS no traumatic finding  MRI small subcortical white matter R frontal lobe infarct. expected evolution R corona radiata infarct. Abnormal flow distal R VA.   MRA Head  Recurrent critical stenosis/near occlusion R M1 s/p angioplasty in 01/2019 w/ reconstituted R MCA. Othewise stable chronic L A1 occlusion, poor flow distal R VA, severe R PCA stenosis.   MRA  Neck stable chronic R VA occlusion   Cerebral angio ([name]) pending in am  LDL 66  HgbA1c 5.5  P2Y12 pending in am  Lovenox 40 mg sq daily for VTE prophylaxis  aspirin 81 mg daily and Brilinta (ticagrelor) 90 mg bid prior to admission, now on aspirin 81 mg daily and Brilinta (ticagrelor) 90 mg bid.   Therapy recommendations:  pending   Disposition:  pending   History of Stroke/TIA  01/2019 - R watershed territory infarct in setting of severe stenosis right M1 segment s/p IR w/ partial revascularization AND possible small left lateral posterior temporal lobe infarct AND R middle cerebellar peduncle in setting of diffuse intracranial stenosis as likely  etiology. EF 65-70%, LDL 139 and A1C 5.6. on ASA and brilinta. D/c to CIR.   08/2013 - TIA, vertigo after toileting during the night w/ AP, pressure and L sided numbness, at [facility])  [phone] - stroke, residual LUE HP per pt. details not available  Baseline dementia  Agitation and sundowning in Sept led to tx w/ Seroquel  Agitated this am, on b/l hand mittens  On seroquel Qhs  Hypertension  Stable . Permissive hypertension (OK if < 220/120) but gradually normalize in 5-7 days . Long-term BP goal 130-160 given right M1 high grade stenosis  Hyperlipidemia  Home meds:  lipitor 40, resumed in hospital  LDL 66, goal < 70  Continue statin at discharge  Other Stroke Risk Factors  Advanced age  Former Cigarette smoker  Obesity, BMI 29.13, recommend weight loss, diet and exercise as appropriate   Mild CAD  Obstructive sleep apnea  Other Active Problems  Hypothyroidism   Gout on allopurinol   Depression on prozac  GERD on PPI  Hospital day # 1  Rosalin Hawking, MD PhD Stroke Neurology 03/30/2019 3:46 PM  To contact Stroke Continuity provider, please refer to [email or web address] After hours, contact General Neurology

## 2019-03-31 ENCOUNTER — Inpatient Hospital Stay (HOSPITAL_COMMUNITY): Payer: Medicare Other

## 2019-03-31 DIAGNOSIS — I63511 Cerebral infarction due to unspecified occlusion or stenosis of right middle cerebral artery: Secondary | ICD-10-CM

## 2019-03-31 HISTORY — PX: IR ANGIO VERTEBRAL SEL SUBCLAVIAN INNOMINATE UNI R MOD SED: IMG5365

## 2019-03-31 HISTORY — PX: IR ANGIO INTRA EXTRACRAN SEL COM CAROTID INNOMINATE BILAT MOD SED: IMG5360

## 2019-03-31 HISTORY — PX: IR US GUIDE VASC ACCESS RIGHT: IMG2390

## 2019-03-31 LAB — PLATELET INHIBITION P2Y12: Platelet Function  P2Y12: 68 [PRU] — ABNORMAL LOW (ref 182–335)

## 2019-03-31 LAB — BASIC METABOLIC PANEL
Anion gap: 13 (ref 5–15)
BUN: 12 mg/dL (ref 8–23)
CO2: 18 mmol/L — ABNORMAL LOW (ref 22–32)
Calcium: 8.9 mg/dL (ref 8.9–10.3)
Chloride: 106 mmol/L (ref 98–111)
Creatinine, Ser: 0.69 mg/dL (ref 0.44–1.00)
GFR calc Af Amer: >60 mL/min (ref >60)
GFR calc non Af Amer: >60 mL/min (ref >60)
Glucose, Bld: 85 mg/dL (ref 70–99)
Potassium: 3.5 mmol/L (ref 3.5–5.1)
Sodium: 137 mmol/L (ref 135–145)

## 2019-03-31 LAB — CBC
HCT: 42.1 % (ref 36.0–46.0)
Hemoglobin: 13 g/dL (ref 12.0–15.0)
MCH: 30.1 pg (ref 26.0–34.0)
MCHC: 30.9 g/dL (ref 30.0–36.0)
MCV: 97.5 fL (ref 80.0–100.0)
Platelets: 262 10*3/uL (ref 150–400)
RBC: 4.32 MIL/uL (ref 3.87–5.11)
RDW: 15.2 % (ref 11.5–15.5)
WBC: 8.3 10*3/uL (ref 4.0–10.5)
nRBC: 0 % (ref 0.0–0.2)

## 2019-03-31 MED ORDER — HEPARIN SODIUM (PORCINE) 1000 UNIT/ML IJ SOLN
INTRAMUSCULAR | Status: AC | PRN
  Administered 2019-03-31: 2000 [IU] via INTRA_ARTERIAL

## 2019-03-31 MED ORDER — VERAPAMIL HCL 2.5 MG/ML IV SOLN
INTRAVENOUS | Status: AC
  Filled 2019-03-31: qty 2

## 2019-03-31 MED ORDER — VERAPAMIL HCL 2.5 MG/ML IV SOLN
INTRAVENOUS | Status: AC | PRN
  Administered 2019-03-31: 2.5 mg via INTRA_ARTERIAL

## 2019-03-31 MED ORDER — NITROGLYCERIN 1 MG/10 ML FOR IR/CATH LAB
INTRA_ARTERIAL | Status: AC | PRN
  Administered 2019-03-31: 200 ug via INTRA_ARTERIAL

## 2019-03-31 MED ORDER — FENTANYL CITRATE (PF) 100 MCG/2ML IJ SOLN
INTRAMUSCULAR | Status: AC
  Filled 2019-03-31: qty 2

## 2019-03-31 MED ORDER — MIDAZOLAM HCL 2 MG/2ML IJ SOLN
INTRAMUSCULAR | Status: AC | PRN
  Administered 2019-03-31: 1 mg via INTRAVENOUS

## 2019-03-31 MED ORDER — SODIUM CHLORIDE 0.9 % IV SOLN
INTRAVENOUS | Status: AC | PRN
  Administered 2019-03-31: 500 mL via INTRAVENOUS

## 2019-03-31 MED ORDER — LIDOCAINE HCL 1 % IJ SOLN
INTRAMUSCULAR | Status: AC
  Filled 2019-03-31: qty 20

## 2019-03-31 MED ORDER — MIDAZOLAM HCL 2 MG/2ML IJ SOLN
INTRAMUSCULAR | Status: AC
  Filled 2019-03-31: qty 2

## 2019-03-31 MED ORDER — FENTANYL CITRATE (PF) 100 MCG/2ML IJ SOLN
INTRAMUSCULAR | Status: AC | PRN
  Administered 2019-03-31: 25 ug via INTRAVENOUS

## 2019-03-31 MED ORDER — SODIUM CHLORIDE 0.9 % IV SOLN
INTRAVENOUS | Status: AC
  Administered 2019-03-31: 14:00:00 via INTRAVENOUS

## 2019-03-31 MED ORDER — HEPARIN SODIUM (PORCINE) 1000 UNIT/ML IJ SOLN
INTRAMUSCULAR | Status: AC
  Filled 2019-03-31: qty 1

## 2019-03-31 MED ORDER — LIDOCAINE HCL 1 % IJ SOLN
INTRAMUSCULAR | Status: AC | PRN
  Administered 2019-03-31: 5 mL

## 2019-03-31 MED ORDER — IOHEXOL 300 MG/ML  SOLN
150.0000 mL | Freq: Once | INTRAMUSCULAR | Status: AC | PRN
  Administered 2019-03-31: 50 mL via INTRA_ARTERIAL

## 2019-03-31 MED ORDER — NITROGLYCERIN 1 MG/10 ML FOR IR/CATH LAB
INTRA_ARTERIAL | Status: AC
  Filled 2019-03-31: qty 10

## 2019-03-31 NOTE — Progress Notes (Signed)
Pt BP 172/85. Not time to give PRN medication. Dr. Pietro Cassis notified. Will continue to monitor. [place]

## 2019-03-31 NOTE — Sedation Documentation (Signed)
BP decreased, Dr. Estanislado Pandy at bedside and aware.  New PIV placed.  NS bolus 559ml started per verbal order from Dr Estanislado Pandy.  Pt is awake and alert to name and place.  Pt denies lightheadedness, dizziness or feelings of faint.  Pt is NOT diaphoretic.  Will cont to monitor.

## 2019-03-31 NOTE — Procedures (Signed)
S.P, bilateral common carotid artreriograms. RT rad approach. Findings. 1Severe preocclusive stenosis of RT MCA M 1 seg. 2.Non opacification of Lt ACA A 1seg. S.Charisma Charlot MD

## 2019-03-31 NOTE — Progress Notes (Signed)
STROKE TEAM PROGRESS NOTE   INTERVAL HISTORY RN and nurse tech at bedside. Pt on bed pan. Christy Carter had cerebral angiogram with Dr. Estanislado Pandy today and showed right MCA severe preocclusive stenosis.  Plan to have right MCA stent early next week.   Vitals:   03/31/19 1240 03/31/19 1245 03/31/19 1250 03/31/19 1255  BP: (!) 120/51 (!) 125/55 (!) 151/63 (!) 154/69  Pulse: 71 71 72 74  Resp: (!) 24 (!) 22 (!) 24 (!) 24  Temp:      TempSrc:      SpO2: 98% 98% 97% 99%    CBC:  Recent Labs  Lab 03/28/19 1457 03/29/19 0806 03/29/19 1813  WBC 9.5 7.5 8.1  NEUTROABS 6.9  --   --   HGB 12.6 11.7* 13.0  HCT 40.1 37.2 39.4  MCV 94.1 93.7 92.1  PLT 277 225 Q000111Q    Basic Metabolic Panel:  Recent Labs  Lab 03/28/19 1457 03/29/19 0806 03/29/19 1813  NA 140 139  --   K 4.2 3.8  --   CL 103 105  --   CO2 25 26  --   GLUCOSE 102* 104*  --   BUN 21 17  --   CREATININE 0.86 0.71 0.77  CALCIUM 9.6 9.1  --    Lipid Panel:     Component Value Date/Time   CHOL 132 03/29/2019 0806   CHOL 98 09/07/2013 0358   TRIG 127 03/29/2019 0806   TRIG 63 09/07/2013 0358   HDL 41 03/29/2019 0806   HDL 43 09/07/2013 0358   CHOLHDL 3.2 03/29/2019 0806   VLDL 25 03/29/2019 0806   VLDL 13 09/07/2013 0358   LDLCALC 66 03/29/2019 0806   LDLCALC 42 09/07/2013 0358   HgbA1c:  Lab Results  Component Value Date   HGBA1C 5.5 03/29/2019   Urine Drug Screen: No results found for: LABOPIA, COCAINSCRNUR, LABBENZ, AMPHETMU, THCU, LABBARB  Alcohol Level No results found for: [facility]  IMAGING past 24h No results found.  PHYSICAL EXAM    Temp:  [97.7 F (36.5 C)-98.8 F (37.1 C)] 98.3 F (36.8 C) (11/19 0753) Pulse Rate:  [71-89] 74 (11/19 1255) Resp:  [16-26] 24 (11/19 1255) BP: (54-186)/(31-84) 154/69 (11/19 1255) SpO2:  [95 %-99 %] 99 % (11/19 1255)  General - Well nourished, well developed, in no apparent distress.  Ophthalmologic - fundi not visualized due to noncooperation.  Cardiovascular -  Regular rhythm and rate.  Mental Status -  Level of arousal and orientation to year, place, and person were intact, but not to month. Language including expression, naming, repetition, comprehension was assessed and found intact. Fund of Knowledge was assessed and was impaired, does not remember previous presidents  Cranial Nerves II - XII - II - Visual field intact OU. III, IV, VI - Extraocular movements intact. V - Facial sensation intact bilaterally. VII - Facial movement intact bilaterally. VIII - Hearing & vestibular intact bilaterally. X - Palate elevates symmetrically. XI - Chin turning & shoulder shrug intact bilaterally. XII - Tongue protrusion intact.  Motor Strength - Christy Carter's strength was normal in right upper and lower extremities, however, left UE and LE 4+/5. Bulk was normal and fasciculations were absent.   Motor Tone - Muscle tone was assessed at Christy neck and appendages and was normal.  Reflexes - Christy Carter's reflexes were symmetrical in all extremities and Christy Carter had no pathological reflexes.  Sensory - Light touch, temperature/pinprick were assessed and were symmetrical.    Coordination - Christy  Carter had normal movements in Christy right hand with no ataxia or dysmetria, left FTN mild dysmetria.  Tremor was absent.  Gait and Station - deferred.   ASSESSMENT/PLAN Christy Carter is a 85 y.o. female with history of R MCA infarct s/p R M2 angioplasty in Sept 2020 presenting to [facility] 03/28/2019 with worsening L HP for Christy past 1 week. MRI w/ new R MCA infarct with recurrent critical stenosis/near occlusion R M1 on MRA. Transferred to [employer] [facility]. [facility].   Stroke:   Small subcortical white matter R frontal lobe infarct in setting of recurrent critical R M1 stenosis/near occlusion   CT head no acute abnormality. Small vessel disease. Atrophy.   CT CS no traumatic finding  MRI small subcortical white matter R frontal lobe  infarct. expected evolution R corona radiata infarct. Abnormal flow distal R VA.   MRA Head  Recurrent critical stenosis/near occlusion R M1 s/p angioplasty in 01/2019 w/ reconstituted R MCA. Othewise stable chronic L A1 occlusion, poor flow distal R VA, severe R PCA stenosis.   MRA  Neck stable chronic R VA occlusion   Cerebral angio ([name]) severe preocclusive stenosis of right M1  Plan for right M1 stent early next week  LDL 66  HgbA1c 5.5  P2Y12 68  Lovenox 40 mg sq daily for VTE prophylaxis  aspirin 81 mg daily and Brilinta (ticagrelor) 90 mg bid prior to admission, now on aspirin 81 mg daily and Brilinta (ticagrelor) 90 mg bid.   Therapy recommendations:  SNF   Disposition:  pending (has bed offers)  History of Stroke/TIA  01/2019 - R watershed territory infarct in setting of severe stenosis right M1 segment s/p IR w/ partial revascularization AND possible small left lateral posterior temporal lobe infarct AND R middle cerebellar peduncle in setting of diffuse intracranial stenosis as likely etiology. EF 65-70%, LDL 139 and A1C 5.6. on ASA and brilinta. D/c to CIR.   08/2013 - TIA, vertigo after toileting during Christy night w/ AP, pressure and L sided numbness, at [facility] [place])  [phone] - stroke, residual LUE HP per pt. details not available  Baseline dementia  Agitation and sundowning in Sept led to tx w/ Seroquel  Agitated this am, on b/l hand mittens  On seroquel Qhs  Hypertension  Stable . Permissive hypertension (OK if < 220/120) but gradually normalize in 5-7 days . Long-term BP goal 130-160 given right M1 high grade stenosis  Hyperlipidemia  Home meds:  lipitor 40, resumed in hospital  LDL 66, goal < 70  Continue statin at discharge  Other Stroke Risk Factors  Advanced age  Former Cigarette smoker  Obesity, BMI 29.13, recommend weight loss, diet and exercise as appropriate   Mild CAD  Obstructive sleep  apnea  Other Active Problems  Hypothyroidism. TSH 17.492  Gout on allopurinol   Depression on prozac  GERD on PPI  Hospital day # 2  Rosalin Hawking, MD PhD Stroke Neurology 03/31/2019 12:59 PM    To contact Stroke Continuity provider, please refer to [email or web address] After hours, contact General Neurology

## 2019-03-31 NOTE — Sedation Documentation (Signed)
Notified Dr Estanislado Pandy that there is oozing at the right radial insertion site.  Verbal order received to put in 3cc of air and to restart the time when air is instilled.

## 2019-03-31 NOTE — Consult Note (Signed)
   [facility] CM Inpatient Consult   03/31/2019  Christy Carter 1933/05/23 [id number]   Patientscreened for less than 7 days and 30 day readmissions, and check forpotential need of [facility] care management services as a benefit under her [employer] plan with 27% high risk score forunplanned readmissionand 4 hospitalizations in the past 6 months.  Patient had [facility] outreach in the past but was closed due to patient's refusal of services.   Review of chart and MD notes show as: [name] is a 85 y.o. female with PMH ofhypertension, gout, hyperlipidemia, arthritis, hypothyroidism, recent ischemic stroke s/p and angioplasty,    presented with complaint of increased confusion, unsteady gait and increase left-sided weakness since 1 week. Stroke work-up was started at [facility].  MRI brain showed small acute lacunar infarct (acute ischemic stroke). Transferred to [facility] for possible endovascular evaluation to assess if she would be a candidate for repeat angioplasty versus stent. Neurology and vascular surgery consulted.  Chart review revealsMD note and inpatient SW note show that patient was recommended for skilled nursing facility (SNF) by therapy.Daughter leaning towards [facility].  No identifiable [facility] Care Management needs at this time,aspatient's care will be met at the skilled level of care.  If there are changes in disposition and needs for appropriate follow-up, please refer to [facility] care management.     For questions and additional information, please call:  Rondarius Kadrmas A. Neiman Roots, BSN, RN-BC [facility] Liaison Cell: [phone]

## 2019-03-31 NOTE — Progress Notes (Addendum)
PROGRESS NOTE  Christy Carter  DOB: 1933-09-24  PCP: Lavera Guise, MD [id number]  DOA: 03/29/2019  LOS: 2 days   Cc: Left-sided weakness for a week  Brief narrative: Christy Carter is a 85 y.o. female with PMH of hypertension, gout, hyperlipidemia, arthritis, hypothyroidism, recent ischemic stroke s/p and angioplasty who presented with complaint of increased confusion, unsteady gait and increase left-sided weakness since 1 week.   Stroke work-up was started at [facility].   MRI brain showed small acute lacunar infarct in the subcortical white matter of the right middle frontal gyrus.  MRI head and neck also showed recurrent critical stenosis/near occlusion of the right MCA M1 segment which was angioplastied in September 2020.   Admitting physician discussed with neurologist on-call Dr. Amie Portland who recommended transfer to [facility] for possible endovascular evaluation to assess if she would be a candidate for repeat angioplasty versus stent. Neurology and vascular surgery consult were obtained.  Subjective: Patient was seen and examined this morning. Patient was upset because he was n.p.o. for procedure today.  No fever in last 24 hours.  Blood pressure elevated up to 186/84. CBC, BMP normal.  Assessment/Plan: Acute ischemic stroke: -Secondary to recurrent critical stenosis/near occlusion of the right MCA M1 segment. -MRI brain, MRA head and neck findings as above. -Neurology consultation appreciated.  Neurovascular consultation called as well for probable need of repeat angioplasty versus stenting. -PT eval appreciated.  SNF versus 24-hour supervision at home recommended. -Continue aspirin, atorvastatin & Brilinta  Intracranial vascular stenosis -MRI head and neck showed recurrent critical stenosis/near occlusion of the right MCA M1 segment which was angioplastied in September 2020. -Neuro intervention radiology consulted. Plan for cerebral arteriogram with possible  intervention  Hypertension -Home meds include clonidine weekly patch and oral Zebeta.  Both resumed.  Hydralazine IV as needed  Hypothyroidism: Continue levothyroxine.  Mixed hyperlipidemia: Reviewed lipid panel from September 2020.   -Continue statin.  Gout: Stable we will continue allopurinol.  Depression: Cont. Prozac & Seroquel  GERD: cont. PPI.  Mobility: PT/OT consultation obtained. SNF recommended. DVT prophylaxis:  Lovenox subcu Code Status:   Code Status: Full Code  Family Communication:  Expected Discharge:  Pending intervention by neuroradiology  Consultants:  Neurology, neuroradiology  Procedures:    Antimicrobials: Anti-infectives (From admission, onward)   None      Diet Order            Diet NPO time specified  Diet effective midnight              Infusions:  . sodium chloride 75 mL/hr at 03/31/19 0000    Scheduled Meds: . allopurinol  100 mg Oral QHS  . aspirin  81 mg Oral Daily  . atorvastatin  40 mg Oral q1800  . bisoprolol  2.5 mg Oral Daily  . enoxaparin (LOVENOX) injection  40 mg Subcutaneous Q24H  . fentaNYL      . FLUoxetine  10 mg Oral Daily  . heparin      . heparin      . levothyroxine  150 mcg Oral Q0600  . lidocaine      . midazolam      . nitroGLYCERIN      . pantoprazole  40 mg Oral Daily  . QUEtiapine  25 mg Oral QHS  . ticagrelor  90 mg Oral BID  . verapamil        PRN meds: acetaminophen **OR** acetaminophen (TYLENOL) oral liquid 160 mg/5 mL **OR** acetaminophen, hydrALAZINE  Objective: Vitals:   03/31/19 1300 03/31/19 1315  BP: (!) 171/67 (!) 178/62  Pulse: 73 74  Resp: (!) 22 (!) 22  Temp:    SpO2: 99% 99%    Intake/Output Summary (Last 24 hours) at 03/31/2019 1342 Last data filed at 03/31/2019 1252 Gross per 24 hour  Intake 224 ml  Output 100 ml  Net 124 ml   There were no vitals filed for this visit. Weight change:  There is no height or weight on file to calculate BMI.   Physical  Exam: General exam: Not in physical distress. Upset because she was n.p.o. Skin: No rashes, lesions or ulcers. HEENT: Atraumatic, normocephalic, supple neck, no obvious bleeding Lungs: Clear to auscultation bilaterally CVS: Regular rate and rhythm, no murmur GI/Abd soft, nontender, nondistended, bowel sound present CNS: Alert, awake, oriented x3 Psychiatry: Upset Extremities: No pedal edema, no calf tenderness  Data Review: I have personally reviewed the laboratory data and studies available.  Recent Labs  Lab 03/28/19 1457 03/29/19 0806 03/29/19 1813  WBC 9.5 7.5 8.1  NEUTROABS 6.9  --   --   HGB 12.6 11.7* 13.0  HCT 40.1 37.2 39.4  MCV 94.1 93.7 92.1  PLT 277 225 227   Recent Labs  Lab 03/28/19 1457 03/29/19 0806 03/29/19 1813  NA 140 139  --   K 4.2 3.8  --   CL 103 105  --   CO2 25 26  --   GLUCOSE 102* 104*  --   BUN 21 17  --   CREATININE 0.86 0.71 0.77  CALCIUM 9.6 9.1  --     Terrilee Croak, MD  [facility] 03/31/2019

## 2019-03-31 NOTE — TOC Progression Note (Addendum)
Transition of Care [facility]) - Progression Note    Patient Details  Name: Christy Carter MRN: [id number] Date of Birth: [birth year]  Transition of Care [facility]) CM/SW [place], [place] Work Phone Number: 03/31/2019, 10:37 AM  Clinical Narrative:    MSW Intern called PT Daughter and provided choice for SNF options. PT was accepted at [facility] and [facility]. PT daughter noted concern about [place], but she wasn't sure if it was the facility she was thinking of. Daughter asked about [facility], but they had declined. MSW Intern told daughter that she would be kept updated if there are any more acceptances. Daughter states that she will research the choices and follow up. Daughter called back to tell MSW Intern that she does not want her to go to [facility], but likes [facility]. She would like to research, and talk to [facility] before making her final decision. She will folllow up with MSW Supervisor tomorrow. SW will continue to follow.   UPDATE: Daughter contacted MSW Intern to confirm that they have chosen [facility]. MSW Intern confirmed the bed with [facility] [facility]. Insurance Josem Kaufmann has been started. SW will follow.  Expected Discharge Plan: Skilled Nursing Facility Barriers to Discharge: Ship broker, Continued Medical Work up  Expected Discharge Plan and Services Expected Discharge Plan: [place] Choice: [place] arrangements for the past 2 months: Single Family Home                 DME Arranged: Air overlay mattress                     Social Determinants of Health (SDOH) Interventions    Readmission Risk Interventions No flowsheet data found.

## 2019-03-31 NOTE — Sedation Documentation (Signed)
Notified Dr. Estanislado Pandy of BP.  Verbal order received for NS bolus to be discontinued now.  Will cont to monitor

## 2019-04-01 DIAGNOSIS — Z8673 Personal history of transient ischemic attack (TIA), and cerebral infarction without residual deficits: Secondary | ICD-10-CM

## 2019-04-01 LAB — PLATELET INHIBITION P2Y12: Platelet Function  P2Y12: 66 [PRU] — ABNORMAL LOW (ref 182–335)

## 2019-04-01 MED ORDER — AMLODIPINE BESYLATE 5 MG PO TABS
5.0000 mg | ORAL_TABLET | Freq: Every day | ORAL | Status: DC
  Administered 2019-04-01 – 2019-04-03 (×3): 5 mg via ORAL
  Filled 2019-04-01 (×3): qty 1

## 2019-04-01 NOTE — Progress Notes (Signed)
STROKE TEAM PROGRESS NOTE   INTERVAL HISTORY Pt sitting in chair, comfortable. No complains, no acute event overnight. She is looking forward to the procedure next week with Dr. Estanislado Pandy.    Vitals:   04/01/19 0840 04/01/19 1000 04/01/19 1100 04/01/19 1144  BP: (!) 141/58 (!) 153/54  (!) 192/70  Pulse: 81   70  Resp: 18 18 18 18   Temp: 98.3 F (36.8 C)     TempSrc: Oral     SpO2:        CBC:  Recent Labs  Lab 03/28/19 1457  03/29/19 1813 03/31/19 1426  WBC 9.5   < > 8.1 8.3  NEUTROABS 6.9  --   --   --   HGB 12.6   < > 13.0 13.0  HCT 40.1   < > 39.4 42.1  MCV 94.1   < > 92.1 97.5  PLT 277   < > 227 262   < > = values in this interval not displayed.    Basic Metabolic Panel:  Recent Labs  Lab 03/29/19 0806 03/29/19 1813 03/31/19 1426  NA 139  --  137  K 3.8  --  3.5  CL 105  --  106  CO2 26  --  18*  GLUCOSE 104*  --  85  BUN 17  --  12  CREATININE 0.71 0.77 0.69  CALCIUM 9.1  --  8.9   Lipid Panel:     Component Value Date/Time   CHOL 132 03/29/2019 0806   CHOL 98 09/07/2013 0358   TRIG 127 03/29/2019 0806   TRIG 63 09/07/2013 0358   HDL 41 03/29/2019 0806   HDL 43 09/07/2013 0358   CHOLHDL 3.2 03/29/2019 0806   VLDL 25 03/29/2019 0806   VLDL 13 09/07/2013 0358   LDLCALC 66 03/29/2019 0806   LDLCALC 42 09/07/2013 0358   HgbA1c:  Lab Results  Component Value Date   HGBA1C 5.5 03/29/2019   Urine Drug Screen: No results found for: LABOPIA, COCAINSCRNUR, LABBENZ, AMPHETMU, THCU, LABBARB  Alcohol Level No results found for: ETH  IMAGING past 24h No results found.  PHYSICAL EXAM    Temp:  [98.3 F (36.8 C)-98.7 F (37.1 C)] 98.3 F (36.8 C) (11/20 0840) Pulse Rate:  [63-95] 70 (11/20 1144) Resp:  [16-24] 18 (11/20 1144) BP: (54-203)/(31-112) 192/70 (11/20 1144) SpO2:  [97 %-100 %] 100 % (11/20 0350)  General - Well nourished, well developed, in no apparent distress.  Ophthalmologic - fundi not visualized due to  noncooperation.  Cardiovascular - Regular rhythm and rate.  Mental Status -  Level of arousal and orientation to year, place, and person were intact, but not to month. Language including expression, naming, repetition, comprehension was assessed and found intact. Fund of Knowledge was assessed and was impaired, does not remember previous presidents  Cranial Nerves II - XII - II - Visual field intact OU. III, IV, VI - Extraocular movements intact. V - Facial sensation intact bilaterally. VII - Facial movement intact bilaterally. VIII - Hearing & vestibular intact bilaterally. X - Palate elevates symmetrically. XI - Chin turning & shoulder shrug intact bilaterally. XII - Tongue protrusion intact.  Motor Strength - The patient's strength was normal in right upper and lower extremities, however, left UE and LE 4+/5. Bulk was normal and fasciculations were absent.   Motor Tone - Muscle tone was assessed at the neck and appendages and was normal.  Reflexes - The patient's reflexes were symmetrical in all extremities and  she had no pathological reflexes.  Sensory - Light touch, temperature/pinprick were assessed and were symmetrical.    Coordination - The patient had normal movements in the right hand with no ataxia or dysmetria, left FTN mild dysmetria.  Tremor was absent.  Gait and Station - deferred.   ASSESSMENT/PLAN [name] is a 85 y.o. female with history of R MCA infarct s/p R M2 angioplasty in Sept 2020 presenting to [facility] 03/28/2019 with worsening L HP for the past 1 week. MRI w/ new R MCA infarct with recurrent critical stenosis/near occlusion R M1 on MRA. Transferred to [facility]. [facility].   Stroke:   Small subcortical white matter R frontal lobe infarct in setting of recurrent critical R M1 stenosis/near occlusion   CT head no acute abnormality. Small vessel disease. Atrophy.   CT CS no traumatic finding  MRI small  subcortical white matter R frontal lobe infarct. expected evolution R corona radiata infarct. Abnormal flow distal R VA.   MRA Head  Recurrent critical stenosis/near occlusion R M1 s/p angioplasty in 01/2019 w/ reconstituted R MCA. Othewise stable chronic L A1 occlusion, poor flow distal R VA, severe R PCA stenosis.   MRA  Neck stable chronic R VA occlusion   Cerebral angio ([name]) severe preocclusive stenosis of right M1  Plan for right M1 stent next Tuesday  LDL 66  HgbA1c 5.5  P2Y12 68  Lovenox 40 mg sq daily for VTE prophylaxis  aspirin 81 mg daily and Brilinta (ticagrelor) 90 mg bid prior to admission, now on aspirin 81 mg daily and Brilinta (ticagrelor) 90 mg bid.   Therapy recommendations:  SNF   Disposition:  pending (has bed offers)  History of Stroke/TIA  01/2019 - R watershed territory infarct in setting of severe stenosis right M1 segment s/p IR w/ partial revascularization AND possible small left lateral posterior temporal lobe infarct AND R middle cerebellar peduncle in setting of diffuse intracranial stenosis as likely etiology. EF 65-70%, LDL 139 and A1C 5.6. on ASA and brilinta. D/c to CIR.   08/2013 - TIA, vertigo after toileting during the night w/ AP, pressure and L sided numbness, at [facility])  [phone] - stroke, residual LUE HP per pt. details not available  Baseline dementia  Agitation and sundowning in Sept led to tx w/ Seroquel  Agitated this am, on b/l hand mittens  On seroquel Qhs  Hypertension  Stable . Permissive hypertension (OK if < 220/120) but gradually normalize in 5-7 days . Long-term BP goal 130-150 given right M1 high grade stenosis  Hyperlipidemia  Home meds:  lipitor 40, resumed in hospital  LDL 66, goal < 70  Continue statin at discharge  Other Stroke Risk Factors  Advanced age  Former Cigarette smoker  Obesity, BMI 29.13, recommend weight loss, diet and exercise as appropriate    Mild CAD  Obstructive sleep apnea  Other Active Problems  Hypothyroidism. TSH 17.492  Gout on allopurinol   Depression on prozac  GERD on PPI  Hospital day # 3  Rosalin Hawking, MD PhD Stroke Neurology 04/01/2019 12:21 PM    To contact Stroke Continuity provider, please refer to [email or web address] After hours, contact General Neurology

## 2019-04-01 NOTE — Progress Notes (Signed)
PROGRESS NOTE  Christy Carter  DOB: 07-Aug-1933  PCP: Lavera Guise, MD [id number]  DOA: 03/29/2019  LOS: 3 days   Cc: Left-sided weakness for a week  Brief narrative: Christy Carter is a 85 y.o. female with PMH of hypertension, gout, hyperlipidemia, arthritis, hypothyroidism, recent ischemic stroke s/p and angioplasty who presented with complaint of increased confusion, unsteady gait and increase left-sided weakness since 1 week.   Stroke work-up was started at [facility].   MRI brain showed small acute lacunar infarct in the subcortical white matter of the right middle frontal gyrus.  MRI head and neck also showed recurrent critical stenosis/near occlusion of the right MCA M1 segment which was angioplastied in September 2020.   Admitting physician discussed with neurologist on-call Dr. Amie Portland who recommended transfer to [facility] for possible endovascular evaluation to assess if she would be a candidate for repeat angioplasty versus stent. Neurology and vascular surgery consult were obtained.  Subjective: Patient was seen and examined this morning.  Elderly Caucasian female.  Propped up in bed.  Not in distress.  No new symptoms.  Underwent diagnostic CT angiogram yesterday.  Plan for procedure next week.   Assessment/Plan: Acute ischemic stroke: -Secondary to recurrent critical stenosis/near occlusion of the right MCA M1 segment. -MRI brain, MRA head and neck findings as above. -Neurology consultation appreciated.  -Continue aspirin, atorvastatin & Brilinta -PT eval was obtained.  Recommend SNF placement.  Intracranial vascular stenosis -MRI head and neck showed recurrent critical stenosis/near occlusion of the right MCA M1 segment which was angioplastied in September 2020. -Seen by neuro intervention radiology Dr. Estanislado Pandy.  Diagnostic CT angiogram on 11/19.  Severe preocclusive stenosis of the right MCA M1 segment was identified.  Patient is planned for stenting of  that lesion next week Tuesday.    Hypertension -Home meds include clonidine weekly patch and oral Zebeta.  Both resumed.  Hydralazine IV as needed.  -Patient blood pressure continues to remain elevated to 150s to 170s.  I will add amlodipine 5 mg daily.  Hypothyroidism: Continue levothyroxine.  Mixed hyperlipidemia: Reviewed lipid panel from September 2020.   -Continue statin.  Gout: Stable we will continue allopurinol.  Depression: Cont. Prozac & Seroquel  GERD: cont. PPI.  Mobility: PT/OT consultation obtained. SNF recommended. DVT prophylaxis:  Lovenox subcu Code Status:   Code Status: Full Code  Family Communication:  Expected Discharge:  Plan for stenting of the right MCA on 11/24.  Consultants:  Neurology, neuroradiology  Procedures:    Antimicrobials: Anti-infectives (From admission, onward)   None      Diet Order            DIET DYS 3 Room service appropriate? Yes; Fluid consistency: Thin  Diet effective now              Infusions:    Scheduled Meds: . allopurinol  100 mg Oral QHS  . aspirin  81 mg Oral Daily  . atorvastatin  40 mg Oral q1800  . bisoprolol  2.5 mg Oral Daily  . enoxaparin (LOVENOX) injection  40 mg Subcutaneous Q24H  . FLUoxetine  10 mg Oral Daily  . levothyroxine  150 mcg Oral Q0600  . pantoprazole  40 mg Oral Daily  . QUEtiapine  25 mg Oral QHS  . ticagrelor  90 mg Oral BID    PRN meds: acetaminophen **OR** acetaminophen (TYLENOL) oral liquid 160 mg/5 mL **OR** acetaminophen, hydrALAZINE   Objective: Vitals:   04/01/19 0350 04/01/19 0840  BP: Marland Kitchen)  151/56 (!) 141/58  Pulse: 82 81  Resp: 18 18  Temp: 98.4 F (36.9 C) 98.3 F (36.8 C)  SpO2: 100%     Intake/Output Summary (Last 24 hours) at 04/01/2019 0857 Last data filed at 04/01/2019 0813 Gross per 24 hour  Intake 1743.16 ml  Output -  Net 1743.16 ml   There were no vitals filed for this visit. Weight change:  There is no height or weight on file to  calculate BMI.   Physical Exam: General exam: Propped up in bed.  Not in distress. Skin: No rashes, lesions or ulcers. HEENT: Atraumatic, normocephalic, supple neck, no obvious bleeding Lungs: Clear to auscultation bilaterally CVS: Regular rate and rhythm, no murmur GI/Abd soft, nontender, nondistended, bowel sound present CNS: Alert, awake, oriented x3 Psychiatry: Upset Extremities: No pedal edema, no calf tenderness.  Right wrist bandaged at angiogram catheter access site  Data Review: I have personally reviewed the laboratory data and studies available.  Recent Labs  Lab 03/28/19 1457 03/29/19 0806 03/29/19 1813 03/31/19 1426  WBC 9.5 7.5 8.1 8.3  NEUTROABS 6.9  --   --   --   HGB 12.6 11.7* 13.0 13.0  HCT 40.1 37.2 39.4 42.1  MCV 94.1 93.7 92.1 97.5  PLT 277 225 227 262   Recent Labs  Lab 03/28/19 1457 03/29/19 0806 03/29/19 1813 03/31/19 1426  NA 140 139  --  137  K 4.2 3.8  --  3.5  CL 103 105  --  106  CO2 25 26  --  18*  GLUCOSE 102* 104*  --  85  BUN 21 17  --  12  CREATININE 0.86 0.71 0.77 0.69  CALCIUM 9.6 9.1  --  8.9    Terrilee Croak, MD  [facility] 04/01/2019

## 2019-04-01 NOTE — Plan of Care (Signed)
Patient progressing towards plan of care goals. 

## 2019-04-01 NOTE — Progress Notes (Signed)
Physical Therapy Treatment Patient Details Name: Christy Carter MRN: [id number] DOB: [birth year] Today's Date: 04/01/2019    History of Present Illness 85 y.o. female with medical history significant of hypertension, gout, hyperlipidemia, arthritis, hypothyroidism, recent ischemic stroke s/p and angioplasty presented with complaint of increased confusion, unsteady gait and increase left-sided weakness for 1 week.  Stroke work-up was done at [facility].  MRI brain showed small acute lacunar infarct in the subcortical white matter of the right middle frontal gyrus.  MRI head and neck also showed recurrent critical stenosis/near occlusion of the right MCA M1 segment which was angioplastied in September 2020.     PT Comments    Pt progressing slowly towards her physical therapy goals. Requiring two person moderate assist to stand with use of Stedy and transfer to chair. Displays weakness, decreased endurance, balance impairments, and decreased cognition. Continue to recommend SNF for ongoing Physical Therapy.       Follow Up Recommendations  SNF;Supervision/Assistance - 24 hour     Equipment Recommendations  None recommended by PT    Recommendations for Other Services       Precautions / Restrictions Precautions Precautions: Fall Restrictions Weight Bearing Restrictions: No    Mobility  Bed Mobility Overal bed mobility: Needs Assistance Bed Mobility: Supine to Sit     Supine to sit: HOB elevated;Mod assist     General bed mobility comments: Pt initiating bringing BLE's off edge of bed, modA for trunk elevation up to sitting  Transfers Overall transfer level: Needs assistance Equipment used: Ambulation equipment used Transfers: Sit to/from Stand Sit to Stand: +2 safety/equipment;Mod assist         General transfer comment: Pt unable to stand from edge of bed. Stood with use of Stedy with modA + 2 after providing demonstration; left lateral lean.  Ambulation/Gait                  Stairs             Wheelchair Mobility    Modified Rankin (Stroke Patients Only) Modified Rankin (Stroke Patients Only) Pre-Morbid Rankin Score: Moderate disability Modified Rankin: Severe disability     Balance Overall balance assessment: Needs assistance Sitting-balance support: Single extremity supported;Feet unsupported Sitting balance-Leahy Scale: Fair Sitting balance - Comments: difficulty with reciprocal scooting   Standing balance support: Bilateral upper extremity supported;During functional activity Standing balance-Leahy Scale: Poor Standing balance comment: Pt required BUE support on RW and +2 assist to maintain balance                            Cognition Arousal/Alertness: Awake/alert Behavior During Therapy: WFL for tasks assessed/performed Overall Cognitive Status: Impaired/Different from baseline Area of Impairment: Attention;Memory;Following commands;Safety/judgement;Awareness;Problem solving                   Current Attention Level: Sustained Memory: Decreased short-term memory Following Commands: Follows one step commands inconsistently;Follows one step commands with increased time Safety/Judgement: Decreased awareness of safety;Decreased awareness of deficits Awareness: Intellectual Problem Solving: Slow processing;Decreased initiation;Difficulty sequencing;Requires verbal cues;Requires tactile cues General Comments: Pt with no recognition of working with therapist two days ago. poor awareness of safety/deficits; stress incontinence.      Exercises General Exercises - Lower Extremity Heel Slides: Both;10 reps;Supine Straight Leg Raises: Both;10 reps;Supine    General Comments        Pertinent Vitals/Pain Pain Assessment: Faces Faces Pain Scale: No hurt    Home Living  Prior Function            PT Goals (current goals can now be found in the care plan section) Acute Rehab PT  Goals Patient Stated Goal: go home PT Goal Formulation: With patient Time For Goal Achievement: 04/13/19 Potential to Achieve Goals: Fair Progress towards PT goals: Progressing toward goals    Frequency    Min 3X/week      PT Plan Current plan remains appropriate    Co-evaluation              AM-PAC PT "6 Clicks" Mobility   Outcome Measure  Help needed turning from your back to your side while in a flat bed without using bedrails?: A Little Help needed moving from lying on your back to sitting on the side of a flat bed without using bedrails?: A Little Help needed moving to and from a bed to a chair (including a wheelchair)?: A Lot Help needed standing up from a chair using your arms (e.g., wheelchair or bedside chair)?: Total Help needed to walk in hospital room?: A Lot Help needed climbing 3-5 steps with a railing? : Total 6 Click Score: 12    End of Session Equipment Utilized During Treatment: Gait belt Activity Tolerance: Patient tolerated treatment well Patient left: in bed;in chair;with chair alarm set Nurse Communication: Mobility status PT Visit Diagnosis: Muscle weakness (generalized) (M62.81);Difficulty in walking, not elsewhere classified (R26.2) Pain - Right/Left: Right Pain - part of body: Ankle and joints of foot     Time: [id number] PT Time Calculation (min) (ACUTE ONLY): 33 min  Charges:  $Therapeutic Activity: 23-37 mins                     Christy Carter, PT, DPT Acute Rehabilitation Services Pager [phone] Office [phone]    Christy Carter 04/01/2019, 5:03 PM

## 2019-04-01 NOTE — Progress Notes (Signed)
  Speech Language Pathology Treatment: Cognitive-Linquistic  Patient Details Name: Christy Carter MRN: [id number] DOB: 1933/12/14 Today's Date: 04/01/2019 Time: [id number] SLP Time Calculation (min) (ACUTE ONLY): 15 min  Assessment / Plan / Recommendation Clinical Impression  Pt was seen for skilled ST targeting cognitive goals.  Upon arrival, pt was oriented to place and situation independently but needed min cues to reorient to month and date.  Pt appears much clearer today in comparison to previous therapy notes during ST evaluation.  Pt did have moments in conversation where she appeared to confuse past and present and she often spoke of family members who had passed away as if they were still alive.  Pt was redirectable though and corrected herself with min assist question cues.  Pt aware that her cognition is altered from her baseline, stating "I just get so confused."  Pt was left in bed with bed alarm set and call bell within reach.  Continue per current plan of care.    HPI HPI: 85 y.o. female with medical history significant of hypertension, gout, hyperlipidemia, arthritis, hypothyroidism, recent ischemic stroke s/p and angioplasty presented with complaint of increased confusion, unsteady gait and increase left-sided weakness for 1 week.  Stroke work-up was done at [facility].  MRI brain showed small acute lacunar infarct in the subcortical white matter of the right middle frontal gyrus.  MRI head and neck also showed recurrent critical stenosis/near occlusion of the right MCA M1 segment which was angioplastied in September 2020.  Patient was discussed by admitting physician with neurologist on-call Dr. Amie Portland who recommended transfer to [facility] for possible endovascular evaluation to assess if she would be a candidate for repeat angioplasty versus stent.      SLP Plan  Continue with current plan of care       Recommendations                   Follow up Recommendations:  24 hour supervision/assistance;Skilled Nursing facility;Inpatient Rehab;Home health SLP SLP Visit Diagnosis: Attention and concentration deficit Attention and concentration deficit following: Cerebral infarction Plan: Continue with current plan of care       GO                Keiarah Orlowski, Selinda Orion 04/01/2019, 11:13 AM

## 2019-04-02 LAB — GLUCOSE, CAPILLARY: Glucose-Capillary: 113 mg/dL — ABNORMAL HIGH (ref 70–99)

## 2019-04-02 NOTE — Plan of Care (Signed)
Patient progressing towards plan of care goals. 

## 2019-04-02 NOTE — Progress Notes (Signed)
PROGRESS NOTE  Christy Carter  DOB: Oct 01, 1933  PCP: Lavera Guise, MD [id number]  DOA: 03/29/2019  LOS: 4 days   Cc: Left-sided weakness for a week  Brief narrative: Christy Carter is a 85 y.o. female with PMH of hypertension, gout, hyperlipidemia, arthritis, hypothyroidism, recent ischemic stroke s/p and angioplasty who presented with complaint of increased confusion, unsteady gait and increase left-sided weakness since 1 week.   Stroke work-up was started at [facility].   MRI brain showed small acute lacunar infarct in the subcortical white matter of the right middle frontal gyrus.  MRI head and neck also showed recurrent critical stenosis/near occlusion of the right MCA M1 segment which was angioplastied in September 2020.   Admitting physician discussed with neurologist on-call Dr. Amie Portland who recommended transfer to [facility] for possible endovascular evaluation to assess if she would be a candidate for repeat angioplasty versus stent. Neurology and vascular surgery consult were obtained.  Subjective: Patient was seen and examined this morning.  Elderly Caucasian female.  Propped up in bed.  Not in distress.  No new symptoms.   Waiting for procedure on Tuesday.  Assessment/Plan: Acute ischemic stroke: -Secondary to recurrent critical stenosis/near occlusion of the right MCA M1 segment. -MRI brain, MRA head and neck findings as above. -Neurology consultation appreciated.  -Continue aspirin, atorvastatin & Brilinta -PT eval was obtained.  Recommend SNF placement.  Intracranial vascular stenosis -MRI head and neck showed recurrent critical stenosis/near occlusion of the right MCA M1 segment which was angioplastied in September 2020. -Seen by neuro intervention radiology Dr. Estanislado Pandy.  Diagnostic CT angiogram on 11/19.  Severe preocclusive stenosis of the right MCA M1 segment was identified.  Patient is planned for stenting of that lesion next week Tuesday, 11/24.   Hypertension -Home meds include clonidine weekly patch and oral Zebeta.  Blood pressure continues to rise despite resumption of both medicines both resumed.   -Added amlodipine 11/20.  Blood pressure improved now.  Continue hydralazine IV as needed.   Hypothyroidism:  -Continue levothyroxine.  Mixed hyperlipidemia:  -Reviewed lipid panel from September 2020.   -Continue statin.  Gout: Stable we will continue allopurinol.  Depression: Cont. Prozac & Seroquel  GERD: cont. PPI.  Mobility: PT/OT consultation obtained. SNF recommended. DVT prophylaxis:  Lovenox subcu Code Status:   Code Status: Full Code  Family Communication:  Expected Discharge:  Plan for stenting of the right MCA on 11/24.  Consultants:  Neurology, neuroradiology  Procedures:    Antimicrobials: Anti-infectives (From admission, onward)   None      Diet Order            Diet NPO time specified Except for: Sips with Meds  Diet effective midnight        DIET DYS 3 Room service appropriate? Yes; Fluid consistency: Thin  Diet effective now              Infusions:    Scheduled Meds: . allopurinol  100 mg Oral QHS  . amLODipine  5 mg Oral Daily  . aspirin  81 mg Oral Daily  . atorvastatin  40 mg Oral q1800  . bisoprolol  2.5 mg Oral Daily  . enoxaparin (LOVENOX) injection  40 mg Subcutaneous Q24H  . FLUoxetine  10 mg Oral Daily  . levothyroxine  150 mcg Oral Q0600  . pantoprazole  40 mg Oral Daily  . QUEtiapine  25 mg Oral QHS  . ticagrelor  90 mg Oral BID    PRN  meds: acetaminophen **OR** acetaminophen (TYLENOL) oral liquid 160 mg/5 mL **OR** acetaminophen, hydrALAZINE   Objective: Vitals:   04/02/19 0846 04/02/19 0918  BP: (!) 138/39 (!) 146/48  Pulse: 83 74  Resp: 18 18  Temp:    SpO2:      Intake/Output Summary (Last 24 hours) at 04/02/2019 [id number] Last data filed at 04/02/2019 0800 Gross per 24 hour  Intake 600 ml  Output 450 ml  Net 150 ml   There were no vitals  filed for this visit. Weight change:  There is no height or weight on file to calculate BMI.   Physical Exam: General exam: Propped up in bed.  Not in distress. Skin: No rashes, lesions or ulcers. HEENT: Atraumatic, normocephalic, supple neck, no obvious bleeding Lungs: Clear to auscultation bilaterally CVS: Regular rate and rhythm, no murmur GI/Abd soft, nontender, nondistended, bowel sound present CNS: Alert, awake, oriented x3 Psychiatry: Mood appropriate Extremities: No pedal edema, no calf tenderness.  Right wrist bandaged at angiogram catheter access site  Data Review: I have personally reviewed the laboratory data and studies available.  Recent Labs  Lab 03/28/19 1457 03/29/19 0806 03/29/19 1813 03/31/19 1426  WBC 9.5 7.5 8.1 8.3  NEUTROABS 6.9  --   --   --   HGB 12.6 11.7* 13.0 13.0  HCT 40.1 37.2 39.4 42.1  MCV 94.1 93.7 92.1 97.5  PLT 277 225 227 262   Recent Labs  Lab 03/28/19 1457 03/29/19 0806 03/29/19 1813 03/31/19 1426  NA 140 139  --  137  K 4.2 3.8  --  3.5  CL 103 105  --  106  CO2 25 26  --  18*  GLUCOSE 102* 104*  --  85  BUN 21 17  --  12  CREATININE 0.86 0.71 0.77 0.69  CALCIUM 9.6 9.1  --  8.9    Terrilee Croak, MD  [facility] 04/02/2019

## 2019-04-03 MED ORDER — AMLODIPINE BESYLATE 10 MG PO TABS
10.0000 mg | ORAL_TABLET | Freq: Every day | ORAL | Status: DC
  Administered 2019-04-04 – 2019-04-11 (×7): 10 mg via ORAL
  Filled 2019-04-03 (×8): qty 1

## 2019-04-03 NOTE — Plan of Care (Signed)
Progressing towards plan of care goals.

## 2019-04-03 NOTE — Progress Notes (Signed)
PROGRESS NOTE  Christy Carter  DOB: 01/08/1934  PCP: Lavera Guise, MD [id number]  DOA: 03/29/2019  LOS: 5 days   Cc: Left-sided weakness for a week  Brief narrative: Christy Carter is a 85 y.o. female with PMH of hypertension, gout, hyperlipidemia, arthritis, hypothyroidism, recent ischemic stroke s/p and angioplasty who presented with complaint of increased confusion, unsteady gait and increase left-sided weakness since 1 week.   Stroke work-up was started at [facility].   MRI brain showed small acute lacunar infarct in the subcortical white matter of the right middle frontal gyrus.  MRI head and neck also showed recurrent critical stenosis/near occlusion of the right MCA M1 segment which was angioplastied in September 2020.   Admitting physician discussed with neurologist on-call Dr. Amie Portland who recommended transfer to [facility] for possible endovascular evaluation to assess if she would be a candidate for repeat angioplasty versus stent. Neurology and vascular surgery consult were obtained.  Subjective: Patient was seen and examined this afternoon.  Lying in bed.  Not in distress.  No new symptoms. Waiting for procedure on Tuesday.  Assessment/Plan: Acute ischemic stroke: -Secondary to recurrent critical stenosis/near occlusion of the right MCA M1 segment. -MRI brain, MRA head and neck findings as above. -Neurology consultation appreciated.  -Continue aspirin, atorvastatin & Brilinta -PT eval was obtained.  Recommend SNF placement.  Intracranial vascular stenosis -MRI head and neck showed recurrent critical stenosis/near occlusion of the right MCA M1 segment which was angioplastied in September 2020. -Seen by neuro intervention radiology Dr. Estanislado Pandy.  Diagnostic CT angiogram on 11/19.  Severe preocclusive stenosis of the right MCA M1 segment was identified.  Patient is planned for stenting of that lesion next week Tuesday, 11/24.  Hypertension -Home meds include  clonidine weekly patch and oral Zebeta.  Blood pressure continued to rise despite resumption of both medicines both resumed.   -Added amlodipine 5 mg 11/20.  Blood pressure still continues to elevate.  I would increase amlodipine dose to 10 mg daily.  Continue hydralazine IV as needed.   Hypothyroidism:  -Continue levothyroxine.  Mixed hyperlipidemia:  -Reviewed lipid panel from September 2020.   -Continue statin.  Gout: Stable we will continue allopurinol.  Depression: Cont. Prozac & Seroquel  GERD: cont. PPI.  Mobility: PT/OT consultation obtained. SNF recommended. DVT prophylaxis:  Lovenox subcu Code Status:   Code Status: Full Code  Family Communication:  Expected Discharge:  Planned for stenting of the right MCA on 11/24.  Consultants:  Neurology, neuroradiology  Procedures:    Antimicrobials: Anti-infectives (From admission, onward)   None      Diet Order            Diet NPO time specified Except for: Sips with Meds  Diet effective midnight        DIET DYS 3 Room service appropriate? Yes; Fluid consistency: Thin  Diet effective now              Infusions:    Scheduled Meds: . allopurinol  100 mg Oral QHS  . [START ON 04/04/2019] amLODipine  10 mg Oral Daily  . aspirin  81 mg Oral Daily  . atorvastatin  40 mg Oral q1800  . bisoprolol  2.5 mg Oral Daily  . enoxaparin (LOVENOX) injection  40 mg Subcutaneous Q24H  . FLUoxetine  10 mg Oral Daily  . levothyroxine  150 mcg Oral Q0600  . pantoprazole  40 mg Oral Daily  . QUEtiapine  25 mg Oral QHS  .  ticagrelor  90 mg Oral BID    PRN meds: acetaminophen **OR** acetaminophen (TYLENOL) oral liquid 160 mg/5 mL **OR** acetaminophen, hydrALAZINE   Objective: Vitals:   04/03/19 1040 04/03/19 1226  BP: (!) 185/68 (!) 159/57  Pulse:  69  Resp: 18 18  Temp:  (!) 97.3 F (36.3 C)  SpO2:  95%    Intake/Output Summary (Last 24 hours) at 04/03/2019 1307 Last data filed at 04/03/2019 1200 Gross per  24 hour  Intake 360 ml  Output -  Net 360 ml   There were no vitals filed for this visit. Weight change:  There is no height or weight on file to calculate BMI.   Physical Exam: General exam: Lying down in bed.  Not in distress.  Skin: No rashes, lesions or ulcers. HEENT: Atraumatic, normocephalic, supple neck, no obvious bleeding Lungs: Clear to auscultation bilaterally CVS: Regular rate and rhythm, no murmur GI/Abd soft, nontender, nondistended, bowel sound present CNS: Alert, awake, oriented x3 Psychiatry: Mood appropriate Extremities: No pedal edema, no calf tenderness.  Data Review: I have personally reviewed the laboratory data and studies available.  Recent Labs  Lab 03/28/19 1457 03/29/19 0806 03/29/19 1813 03/31/19 1426  WBC 9.5 7.5 8.1 8.3  NEUTROABS 6.9  --   --   --   HGB 12.6 11.7* 13.0 13.0  HCT 40.1 37.2 39.4 42.1  MCV 94.1 93.7 92.1 97.5  PLT 277 225 227 262   Recent Labs  Lab 03/28/19 1457 03/29/19 0806 03/29/19 1813 03/31/19 1426  NA 140 139  --  137  K 4.2 3.8  --  3.5  CL 103 105  --  106  CO2 25 26  --  18*  GLUCOSE 102* 104*  --  85  BUN 21 17  --  12  CREATININE 0.86 0.71 0.77 0.69  CALCIUM 9.6 9.1  --  8.9    Terrilee Croak, MD  [facility] 04/03/2019

## 2019-04-03 NOTE — Progress Notes (Signed)
Pt appears sad and tearful. When questioned pt expresses concern over how she's going to get home, her financial situation, and upcoming procedure.   Pt expresses some suicidal ideation saying "I just need this to end" and "I'm going to have to get a bullet." Pt is able to answer orientation questions on occasion but still presents with some confusion. She often wakes up thinking she is at home or at the beach.   On call PA made aware. No new orders at this time. Will continue to monitor closely.

## 2019-04-04 ENCOUNTER — Other Ambulatory Visit (HOSPITAL_COMMUNITY): Payer: Self-pay

## 2019-04-04 LAB — SURGICAL PCR SCREEN
MRSA, PCR: POSITIVE — AB
Staphylococcus aureus: POSITIVE — AB

## 2019-04-04 LAB — PLATELET INHIBITION P2Y12: Platelet Function  P2Y12: 19 [PRU] — ABNORMAL LOW (ref 182–335)

## 2019-04-04 MED ORDER — DIPHENHYDRAMINE HCL 25 MG PO CAPS
25.0000 mg | ORAL_CAPSULE | Freq: Once | ORAL | Status: DC
  Filled 2019-04-04: qty 1

## 2019-04-04 MED ORDER — DIPHENHYDRAMINE HCL 50 MG/ML IJ SOLN
12.5000 mg | Freq: Once | INTRAMUSCULAR | Status: AC
  Administered 2019-04-04: 12.5 mg via INTRAVENOUS
  Filled 2019-04-04: qty 1

## 2019-04-04 MED ORDER — MUPIROCIN 2 % EX OINT
1.0000 "application " | TOPICAL_OINTMENT | Freq: Two times a day (BID) | CUTANEOUS | Status: AC
  Administered 2019-04-04 – 2019-04-09 (×9): 1 via NASAL
  Filled 2019-04-04 (×2): qty 22

## 2019-04-04 NOTE — Progress Notes (Signed)
Physical Therapy Treatment Patient Details Name: Christy Carter MRN: [id number] DOB: [birth year] Today's Date: 04/04/2019    History of Present Illness 85 y.o. female with medical history significant of hypertension, gout, hyperlipidemia, arthritis, hypothyroidism, recent ischemic stroke s/p and angioplasty presented with complaint of increased confusion, unsteady gait and increase left-sided weakness for 1 week.  Stroke work-up was done at [facility].  MRI brain showed small acute lacunar infarct in the subcortical white matter of the right middle frontal gyrus.  MRI head and neck also showed recurrent critical stenosis/near occlusion of the right MCA M1 segment which was angioplastied in September 2020.     PT Comments    Pt performed gt training and functional mobility.  He gt involved 4-5 steps from bed to recliner with +2 moderate assistance and difficulty advancing LLE.  Pt continues to present with confusion.  She remains to benefit from skilled rehaB [facility] before returning home.  Informed nursing of progress with mobility but left lift pad under patient for safe return back to bed.     Follow Up Recommendations  SNF;Supervision/Assistance - 24 hour     Equipment Recommendations  None recommended by PT    Recommendations for Other Services       Precautions / Restrictions Precautions Precautions: Fall Precaution Comments: L inattention, left hemiparesis Restrictions Weight Bearing Restrictions: No    Mobility  Bed Mobility Overal bed mobility: Needs Assistance Bed Mobility: Supine to Sit     Supine to sit: HOB elevated;Mod assist;+2 for physical assistance     General bed mobility comments: Required increased time for initiation and sequencing with bed mobility, Mod+2 to elevate trunk into sitting, advance LEs to edge of bed and to scoot hips closer to edge of bed.  Transfers Overall transfer level: Needs assistance Equipment used: Rolling walker (2 wheeled) Transfers: Sit  to/from Stand Sit to Stand: +2 safety/equipment;Mod assist;+2 physical assistance Stand pivot transfers: Mod assist;+2 physical assistance;+2 safety/equipment       General transfer comment: Sit > stand from EOB with Mod A +2 for weight shift.  Cues for sequencing and step to turn hips towards recliner chair at bed side.  Ambulation/Gait                 Stairs             Wheelchair Mobility    Modified Rankin (Stroke Patients Only)       Balance Overall balance assessment: Needs assistance Sitting-balance support: Single extremity supported;Feet unsupported Sitting balance-Leahy Scale: Fair Sitting balance - Comments: difficulty with reciprocal scooting Postural control: Left lateral lean Standing balance support: Bilateral upper extremity supported;During functional activity Standing balance-Leahy Scale: Poor Standing balance comment: Pt required BUE support on RW and +2 assist to maintain balance                            Cognition Arousal/Alertness: Awake/alert Behavior During Therapy: WFL for tasks assessed/performed Overall Cognitive Status: Impaired/Different from baseline Area of Impairment: Attention;Memory;Following commands;Safety/judgement;Awareness;Problem solving                 Orientation Level: (able to identify [place] after reducating she was in [place] and not [place].) Current Attention Level: Sustained Memory: Decreased short-term memory Following Commands: Follows one step commands with increased time;Follows one step commands inconsistently Safety/Judgement: Decreased awareness of safety;Decreased awareness of deficits Awareness: Intellectual Problem Solving: Slow processing;Decreased initiation;Difficulty sequencing;Requires verbal cues;Requires tactile cues  Exercises      General Comments        Pertinent Vitals/Pain Pain Assessment: No/denies pain Pain Score: 0-No pain Pain Location: right  foot with weightbearing, tender to palpation along dorsal aspect Pain Descriptors / Indicators: Grimacing;Sore Pain Intervention(s): Monitored during session    Home Living                      Prior Function            PT Goals (current goals can now be found in the care plan section) Acute Rehab PT Goals Patient Stated Goal: go home Potential to Achieve Goals: Fair Progress towards PT goals: Progressing toward goals    Frequency    Min 3X/week      PT Plan Current plan remains appropriate    Co-evaluation PT/OT/SLP Co-Evaluation/Treatment: Yes Reason for Co-Treatment: Complexity of the patient's impairments (multi-system involvement) PT goals addressed during session: Mobility/safety with mobility OT goals addressed during session: ADL's and self-care      AM-PAC PT "6 Clicks" Mobility   Outcome Measure  Help needed turning from your back to your side while in a flat bed without using bedrails?: A Lot Help needed moving from lying on your back to sitting on the side of a flat bed without using bedrails?: A Lot Help needed moving to and from a bed to a chair (including a wheelchair)?: A Lot Help needed standing up from a chair using your arms (e.g., wheelchair or bedside chair)?: A Lot Help needed to walk in hospital room?: A Lot Help needed climbing 3-5 steps with a railing? : Total 6 Click Score: 11    End of Session Equipment Utilized During Treatment: Gait belt Activity Tolerance: Patient tolerated treatment well Patient left: in bed;in chair;with chair alarm set Nurse Communication: Mobility status PT Visit Diagnosis: Muscle weakness (generalized) (M62.81);Difficulty in walking, not elsewhere classified (R26.2) Pain - Right/Left: Right Pain - part of body: Ankle and joints of foot     Time: [id number] PT Time Calculation (min) (ACUTE ONLY): 33 min  Charges:  $Therapeutic Activity: 8-22 mins                     Christy Carter , PTA Acute  Rehabilitation Services Pager [phone] Office [phone]     Christy Carter 04/04/2019, 3:43 PM

## 2019-04-04 NOTE — Progress Notes (Signed)
PROGRESS NOTE  Christy Carter  DOB: 11/20/1933  PCP: Lavera Guise, MD GN:[id number]  DOA: 03/29/2019  LOS: 6 days   Cc: Left-sided weakness for a week  Brief narrative: Christy Carter is a 85 y.o. female with PMH of hypertension, gout, hyperlipidemia, arthritis, hypothyroidism, recent ischemic stroke s/p and angioplasty who presented with complaint of increased confusion, unsteady gait and increase left-sided weakness since 1 week.   Stroke work-up was started at [facility].   MRI brain showed small acute lacunar infarct in the subcortical white matter of the right middle frontal gyrus.  MRI head and neck also showed recurrent critical stenosis/near occlusion of the right MCA M1 segment which was angioplastied in September 2020.   Admitting physician discussed with neurologist on-call Dr. Amie Portland who recommended transfer to [facility] for possible endovascular evaluation to assess if she would be a candidate for repeat angioplasty versus stent. Neurology and vascular surgery consult were obtained.  Subjective: Patient was seen and examined this morning.  Sitting up at the edge of the bed.  Not in distress but emotional this morning.  She feels he is not being cared in the hospital.  I think she is also overwhelmed with the procedure tomorrow.    Assessment/Plan: Acute ischemic stroke: -Secondary to recurrent critical stenosis/near occlusion of the right MCA M1 segment. -MRI brain, MRA head and neck findings as above. -Neurology consultation appreciated.  -Continue aspirin, atorvastatin & Brilinta -PT eval was obtained.  Recommend SNF placement.  Intracranial vascular stenosis -MRI head and neck showed recurrent critical stenosis/near occlusion of the right MCA M1 segment which was angioplastied in September 2020. -Seen by neuro intervention radiology Dr. Estanislado Pandy.  Diagnostic CT angiogram on 11/19.  Severe preocclusive stenosis of the right MCA M1 segment was identified.   Patient is planned for stenting of that lesion tomorrow on Tuesday, 11/24.  Hypertension -Home meds include clonidine weekly patch and oral Zebeta.  Blood pressure continued to rise despite resumption of both medicines both resumed.   -Added amlodipine 5 mg 11/20.  Blood pressure still continues to elevate.  I would increase amlodipine dose to 10 mg daily.  Blood pressure seems to be improved mostly [id number] systolic and [id number] diastolic now. Continue hydralazine IV as needed.   Hypothyroidism:  -Continue levothyroxine.  Mixed hyperlipidemia:  -Reviewed lipid panel from September 2020.   -Continue statin.  Gout: Stable we will continue allopurinol.  Depression: Cont. Prozac & Seroquel  GERD: cont. PPI.  Mobility: PT/OT consultation obtained. SNF recommended. DVT prophylaxis:  Lovenox subcu Code Status:   Code Status: Full Code  Family Communication:  Expected Discharge:  Planned for stenting of the right MCA on 11/24.  Consultants:  Neurology, neuroradiology  Procedures:    Antimicrobials: Anti-infectives (From admission, onward)   None      Diet Order            Diet NPO time specified Except for: Sips with Meds  Diet effective midnight        DIET DYS 3 Room service appropriate? Yes; Fluid consistency: Thin  Diet effective now              Infusions:    Scheduled Meds: . allopurinol  100 mg Oral QHS  . amLODipine  10 mg Oral Daily  . aspirin  81 mg Oral Daily  . atorvastatin  40 mg Oral q1800  . bisoprolol  2.5 mg Oral Daily  . FLUoxetine  10 mg Oral Daily  .  levothyroxine  150 mcg Oral Q0600  . pantoprazole  40 mg Oral Daily  . QUEtiapine  25 mg Oral QHS  . ticagrelor  90 mg Oral BID    PRN meds: acetaminophen **OR** acetaminophen (TYLENOL) oral liquid 160 mg/5 mL **OR** acetaminophen, hydrALAZINE   Objective: Vitals:   04/04/19 1200 04/04/19 1602  BP: (!) 146/57 (!) 138/55  Pulse: (!) 58 61  Resp:  16  Temp:  97.8 F (36.6 C)  SpO2:  97%     Intake/Output Summary (Last 24 hours) at 04/04/2019 1622 Last data filed at 04/04/2019 1200 Gross per 24 hour  Intake 480 ml  Output 300 ml  Net 180 ml   There were no vitals filed for this visit. Weight change:  There is no height or weight on file to calculate BMI.   Physical Exam: General exam: Lying down in bed.  Not in physical distress Skin: No rashes, lesions or ulcers. HEENT: Atraumatic, normocephalic, supple neck, no obvious bleeding Lungs: Clear to auscultation bilaterally CVS: Regular rate and rhythm, no murmur GI/Abd soft, nontender, nondistended, bowel sound present CNS: Alert, awake, oriented x3 Psychiatry: Emotional this morning. Extremities: No pedal edema, no calf tenderness.  Data Review: I have personally reviewed the laboratory data and studies available.  Recent Labs  Lab 03/29/19 0806 03/29/19 1813 03/31/19 1426  WBC 7.5 8.1 8.3  HGB 11.7* 13.0 13.0  HCT 37.2 39.4 42.1  MCV 93.7 92.1 97.5  PLT 225 227 262   Recent Labs  Lab 03/29/19 0806 03/29/19 1813 03/31/19 1426  NA 139  --  137  K 3.8  --  3.5  CL 105  --  106  CO2 26  --  18*  GLUCOSE 104*  --  85  BUN 17  --  12  CREATININE 0.71 0.77 0.69  CALCIUM 9.1  --  8.9    Terrilee Croak, MD  [facility] 04/04/2019

## 2019-04-04 NOTE — Progress Notes (Signed)
Pt refusing vital signs and telemetry. Pt becomes combative when staff attempts to replace tele leads or take vitals. On call provider notified.

## 2019-04-04 NOTE — Progress Notes (Signed)
Pt refusing AM meds. On call provider notified.

## 2019-04-04 NOTE — Progress Notes (Signed)
  Speech Language Pathology Treatment: Cognitive-Linquistic  Patient Details Name: Christy Carter MRN: [id number] DOB: [birth year] Today's Date: 04/04/2019 Time: 1502-     Assessment / Plan / Recommendation Clinical Impression  Pt was encountered awake/alert, sitting upright in chair.  Pt was oriented to self, month and city independently.  She reported that she was at a doctor's office and that her surgery was scheduled for tomorrow at a hospital in [place] (she was unsure of which one).  Pt was re-oriented to place.  Treatment session occurred directly after PA visit which may have increased her orientation.   RN reported that pt had only been oriented to self and that she required frequent reorientation throughout the day.  Pt was shown her room clock which stated the day of the week, date, and time.  Pt was able to state the correct date and day of the week given the use of the clock and minimal verbal cues.  Clock was left in the pt's immediate field of vision to help increase orientation.  Pt attempted to call her son with the call bell/remote control.  Pt was redirected to her room phone and she was able to independently state her son's phone number.  SLP dialed the phone for her.  Pt spoke with son and independently recalled that her surgery was scheduled for tomorrow.  Pt was tearful for the remainder of the session following phone call with son.  Recommend continuation of skilled ST targeting cognitive-linguistic deficits.     HPI HPI: 85 y.o. female with medical history significant of hypertension, gout, hyperlipidemia, arthritis, hypothyroidism, recent ischemic stroke s/p and angioplasty presented with complaint of increased confusion, unsteady gait and increase left-sided weakness for 1 week.  Stroke work-up was done at [facility].  MRI brain showed small acute lacunar infarct in the subcortical white matter of the right middle frontal gyrus.  MRI head and neck also showed recurrent critical  stenosis/near occlusion of the right MCA M1 segment which was angioplastied in September 2020.  Patient was discussed by admitting physician with neurologist on-call Dr. Amie Portland who recommended transfer to [facility] for possible endovascular evaluation to assess if she would be a candidate for repeat angioplasty versus stent.      SLP Plan          Recommendations                   Oral Care Recommendations: Oral care BID Follow up Recommendations: 24 hour supervision/assistance;Skilled Nursing facility;Inpatient Rehab;Home health SLP SLP Visit Diagnosis: Cognitive communication deficit [phone])       Haworth., M.S., [place] [facility] Office: [phone]  [place] 04/04/2019, 3:15 PM

## 2019-04-04 NOTE — Progress Notes (Signed)
STROKE TEAM PROGRESS NOTE   INTERVAL HISTORY Patient is lying comfortably in bed.  She has no questions.  Vital signs stable.  Elective right MCA angioplasty stenting is planned for later this week.  Vitals:   04/03/19 1634 04/03/19 1937 04/04/19 0002 04/04/19 0802  BP: (!) 152/58 (!) 154/53 (!) 157/55 (!) 155/58  Pulse: 66 65 68 67  Resp: (!) 21   18  Temp: (!) 97.5 F (36.4 C) 97.7 F (36.5 C) 97.8 F (36.6 C)   TempSrc: Oral Oral Oral   SpO2: 97% 96% 97% 97%    CBC:  Recent Labs  Lab 03/28/19 1457  03/29/19 1813 03/31/19 1426  WBC 9.5   < > 8.1 8.3  NEUTROABS 6.9  --   --   --   HGB 12.6   < > 13.0 13.0  HCT 40.1   < > 39.4 42.1  MCV 94.1   < > 92.1 97.5  PLT 277   < > 227 262   < > = values in this interval not displayed.    Basic Metabolic Panel:  Recent Labs  Lab 03/29/19 0806 03/29/19 1813 03/31/19 1426  NA 139  --  137  K 3.8  --  3.5  CL 105  --  106  CO2 26  --  18*  GLUCOSE 104*  --  85  BUN 17  --  12  CREATININE 0.71 0.77 0.69  CALCIUM 9.1  --  8.9   Lipid Panel:     Component Value Date/Time   CHOL 132 03/29/2019 0806   CHOL 98 09/07/2013 0358   TRIG 127 03/29/2019 0806   TRIG 63 09/07/2013 0358   HDL 41 03/29/2019 0806   HDL 43 09/07/2013 0358   CHOLHDL 3.2 03/29/2019 0806   VLDL 25 03/29/2019 0806   VLDL 13 09/07/2013 0358   LDLCALC 66 03/29/2019 0806   LDLCALC 42 09/07/2013 0358   HgbA1c:  Lab Results  Component Value Date   HGBA1C 5.5 03/29/2019   Urine Drug Screen: No results found for: LABOPIA, COCAINSCRNUR, LABBENZ, AMPHETMU, THCU, LABBARB  Alcohol Level No results found for: ETH  IMAGING past 24h No results found.  PHYSICAL EXAM   General - Well nourished, well developed, in no apparent distress.  Ophthalmologic - fundi not visualized due to noncooperation.  Cardiovascular - Regular rhythm and rate.  Mental Status -  Awake alert oriented to time and place only.  Diminished attention, registration and recall.  Poor  insight into her illness.  Speech is fluent without aphasia or dysarthria.  Follows only simple midline and one-step commands.  Cranial Nerves II - XII - II - Visual field intact OU. III, IV, VI - Extraocular movements intact. V - Facial sensation intact bilaterally. VII - Facial movement intact bilaterally. VIII - Hearing & vestibular intact bilaterally. X - Palate elevates symmetrically. XI - Chin turning & shoulder shrug intact bilaterally. XII - Tongue protrusion intact.  Motor Strength - The patient's strength was normal in right upper and lower extremities, however, left UE and LE 4+/5. Bulk was normal and fasciculations were absent.   Motor Tone - Muscle tone was assessed at the neck and appendages and was normal.  Reflexes - The patient's reflexes were symmetrical in all extremities    Sensory - Light touch, temperature/pinprick were assessed and were symmetrical.    Coordination - The patient had normal movements in the right hand with no ataxia or dysmetria, left FTN mild dysmetria.  Tremor  was absent.  Gait and Station - deferred.   ASSESSMENT/PLAN Ms. GERALDYNE HUNSINGER is a 85 y.o. female with history of R MCA infarct s/p R M2 angioplasty in Sept 2020 presenting to [facility] 03/28/2019 with worsening L HP for the past 1 week. MRI w/ new R MCA infarct with recurrent critical stenosis/near occlusion R M1 on MRA. Transferred to [facility].   Stroke:   Small subcortical white matter R frontal lobe infarct in setting of recurrent critical R M1 stenosis/near occlusion   CT head no acute abnormality. Small vessel disease. Atrophy.   CT CS no traumatic finding  MRI small subcortical white matter R frontal lobe infarct. expected evolution R corona radiata infarct. Abnormal flow distal R VA.   MRA Head  Recurrent critical stenosis/near occlusion R M1 s/p angioplasty in 01/2019 w/ reconstituted R MCA. Othewise stable chronic L A1 occlusion,  poor flow distal R VA, severe R PCA stenosis.   MRA  Neck stable chronic R VA occlusion   Cerebral angio ([name]) severe preocclusive stenosis of right M1  Plan for right M1 stent next Tuesday     LDL 66  HgbA1c 5.5  P2Y12 68  Lovenox 40 mg sq daily for VTE prophylaxis  aspirin 81 mg daily and Brilinta (ticagrelor) 90 mg bid prior to admission, now on aspirin 81 mg daily and Brilinta (ticagrelor) 90 mg bid.   Therapy recommendations:  SNF   Disposition:  pending (has bed offers)  History of Stroke/TIA  01/2019 - R watershed territory infarct in setting of severe stenosis right M1 segment s/p IR w/ partial revascularization AND possible small left lateral posterior temporal lobe infarct AND R middle cerebellar peduncle in setting of diffuse intracranial stenosis as likely etiology. EF 65-70%, LDL 139 and A1C 5.6. on ASA and brilinta. D/c to CIR.   08/2013 - TIA, vertigo after toileting during the night w/ AP, pressure and L sided numbness, at [facility])  [phone] - stroke, residual LUE HP per pt. details not available  Baseline dementia  Agitation and sundowning in Sept led to tx w/ Seroquel  Agitated this am, on b/l hand mittens  On seroquel Qhs  Hypertension  Stable . Permissive hypertension (OK if < 220/120) but gradually normalize in 5-7 days . Long-term BP goal 130-150 given right M1 high grade stenosis  Hyperlipidemia  Home meds:  lipitor 40, resumed in hospital  LDL 66, goal < 70  Continue statin at discharge  Other Stroke Risk Factors  Advanced age  Former Cigarette smoker  Obesity, BMI 29.13, recommend weight loss, diet and exercise as appropriate   Mild CAD  Obstructive sleep apnea  Other Active Problems  Hypothyroidism. TSH 17.492  Gout on allopurinol   Depression on prozac  GERD on PPI  Hospital day # 6  Await elective right MCA repeat angioplasty/stenting by Dr. Estanislado Pandy in the next few days.   Continue ongoing management. Antony Contras, MD Medical Director [facility] Pager: [phone] 04/04/2019 1:48 PM   To contact Stroke Continuity provider, please refer to [email or web address] After hours, contact General Neurology

## 2019-04-04 NOTE — Plan of Care (Signed)
Patient progressing towards plan of care goals. 

## 2019-04-04 NOTE — Progress Notes (Signed)
Chief Complaint: Patient was seen in consultation today for right MCA M1 segment stenosis/endovascular revascularization.  Referring Physician(s): Kerney Elbe  Supervising Physician: [name]  Patient Status: [facility] - In-pt  History of Present Illness: Christy Carter is a 85 y.o. female with a past medical history of hypertension, hyperlipidemia, CVA 01/2019, thyroid disease, arthritis, and depression. She is known to [facility]- she underwent an image-guided cerebral arteriogram with revascularization of right MCA stenosis using balloon angioplasty 02/05/2019 by Dr. Estanislado Pandy. She presented to [facility] ED 03/28/2019 with complaints of increased confusion, unsteady gait, and increased left-sided weakness. In ED, MR brain revealed a small acute lacunar infarct. She was transferred and admitted to [facility] for further management. Neurology was consulted who recommended NIR for possible image-guided diagnostic cerebral arteriogram for further evaluation. She underwent this procedure 03/31/2019 by Dr. Estanislado Pandy which revealed severe pre-occlusive right MCA M1 segment stenosis- thought to be cause of acute CVA. Results were discussed with patient, and it is recommended that patient undergo revascularization of right MCA M1 segment stenosis to prevent future strokes.  Patient awake and alert sitting in chair. Complains of left-sided weakness, stable since admission. Denies fever, chills, chest pain, dyspnea, abdominal pain, or headache.   Past Medical History:  Diagnosis Date   Arthritis    Carpal tunnel syndrome    Complication of anesthesia    Difficulty waking up. Believes it took 2 days to wake up after her surgery.   Depression    Hyperlipidemia    Hypertension    Shortness of breath    Stroke [facility])    Thyroid disease     Past Surgical History:  Procedure Laterality Date   APPENDECTOMY     BACK SURGERY     CHOLECYSTECTOMY     HERNIA REPAIR     IR CT HEAD LTD  02/05/2019     IR PTA INTRACRANIAL  02/05/2019   RADIOLOGY WITH ANESTHESIA N/A 02/05/2019   Procedure: CODE STROKE;  Surgeon: Radiologist, Medication, MD;  Location: [place];  Service: Radiology;  Laterality: N/A;    Allergies: Patient has no known allergies.  Medications: Prior to Admission medications   Medication Sig Start Date End Date Taking? Authorizing Provider  acetaminophen (TYLENOL) 325 MG tablet Take 2 tablets (650 mg total) by mouth every 4 (four) hours as needed for mild pain (or temp > 37.5 C (99.5 F)). 03/08/19  Yes Angiulli, Lavon Paganini, PA-C  allopurinol (ZYLOPRIM) 100 MG tablet Take 2 tabs at bed time for gout 01/12/19  Yes Ronnell Freshwater, NP  aspirin EC 81 MG EC tablet Take 1 tablet (81 mg total) by mouth daily. 02/11/19  Yes Donzetta Starch, NP  atorvastatin (LIPITOR) 40 MG tablet Take 1 tablet (40 mg total) by mouth daily at 6 PM. 03/08/19  Yes Angiulli, Lavon Paganini, PA-C  bisoprolol (ZEBETA) 5 MG tablet Take 0.5 tablets (2.5 mg total) by mouth daily. 03/08/19  Yes Angiulli, Lavon Paganini, PA-C  cloNIDine (CATAPRES - DOSED IN MG/24 HR) 0.1 mg/24hr patch Place 1 patch (0.1 mg total) onto the skin every Monday. 03/14/19  Yes Angiulli, Lavon Paganini, PA-C  colchicine 0.6 MG tablet Take 1 tablet (0.6 mg total) by mouth 2 (two) times daily. 12/06/18  Yes Boscia, Heather E, NP  Cyanocobalamin (B-12) 2500 MCG TABS Take 2,500 mcg by mouth daily as needed (For Lethargy).    Yes [provider]  diclofenac sodium (VOLTAREN) 1 % GEL Apply 4 g topically 4 (four) times daily. Patient taking differently:  Apply 4 g topically 4 (four) times daily as needed (For pain).  03/08/19  Yes Angiulli, Lavon Paganini, PA-C  FLUoxetine (PROZAC) 10 MG capsule Take 1 capsule (10 mg total) by mouth daily. 03/08/19  Yes Angiulli, Lavon Paganini, PA-C  levothyroxine (SYNTHROID) 150 MCG tablet Take 1 tablet (150 mcg total) by mouth daily before breakfast. 03/08/19  Yes Angiulli, Lavon Paganini, PA-C  omeprazole (PRILOSEC) 40 MG capsule Take 1  capsule (40 mg total) by mouth daily. 03/08/19  Yes Angiulli, Lavon Paganini, PA-C  QUEtiapine (SEROQUEL) 25 MG tablet Take 1 tablet (25 mg total) by mouth at bedtime. 03/08/19  Yes Angiulli, Lavon Paganini, PA-C  ticagrelor (BRILINTA) 90 MG TABS tablet Take 1 tablet (90 mg total) by mouth 2 (two) times daily. 03/08/19  Yes Angiulli, Lavon Paganini, PA-C  VENTOLIN HFA 108 (90 Base) MCG/ACT inhaler TAKE 2 PUFFS BY MOUTH EVERY 6 HOURS AS NEEDED FOR WHEEZE OR SHORTNESS OF BREATH Patient taking differently: Inhale 2 puffs into the lungs every 6 (six) hours as needed for shortness of breath.  03/19/18  Yes Boscia, Greer Ee, NP  Vitamin D, Ergocalciferol, (DRISDOL) 1.25 MG (50000 UT) CAPS capsule Take 1 capsule (50,000 Units total) by mouth every 7 (seven) days. 02/02/19  Yes Kendell Bane, NP     Family History  Problem Relation Age of Onset   Heart disease Mother    Hypertension Mother    Diverticulitis Mother    Heart disease Father    Hypertension Father     Social History   Socioeconomic History   Marital status: Widowed    Spouse name: Not on file   Number of children: Not on file   Years of education: Not on file   Highest education level: Not on file  Occupational History   Not on file  Social Needs   Financial resource strain: Somewhat hard   Food insecurity    Worry: Sometimes true    Inability: Sometimes true   Transportation needs    Medical: No    Non-medical: No  Tobacco Use   Smoking status: Former Smoker    Types: Cigarettes    Quit date: 08/15/1992    Years since quitting: 26.6   Smokeless tobacco: Never Used  Substance and Sexual Activity   Alcohol use: No   Drug use: No   Sexual activity: Not Currently  Lifestyle   Physical activity    Days per week: 0 days    Minutes per session: 0 min   Stress: Only a little  Relationships   Social connections    Talks on phone: More than three times a week    Gets together: More than three times a week     Attends religious service: More than 4 times per year    Active member of club or organization: Yes    Attends meetings of clubs or organizations: 1 to 4 times per year    Relationship status: Widowed  Other Topics Concern   Not on file  Social History Narrative   Not on file     Review of Systems: A 12 point ROS discussed and pertinent positives are indicated in the HPI above.  All other systems are negative.  Review of Systems  Constitutional: Negative for chills and fever.  Respiratory: Negative for shortness of breath and wheezing.   Cardiovascular: Negative for chest pain and palpitations.  Gastrointestinal: Negative for abdominal pain.  Neurological: Positive for weakness. Negative for headaches.  Psychiatric/Behavioral: Negative for behavioral  problems and confusion.    Vital Signs: BP (!) 146/57 (BP Location: Right Arm)    Pulse (!) 58    Temp 97.8 F (36.6 C) (Oral)    Resp 18    SpO2 97%   Physical Exam Vitals signs and nursing note reviewed.  Constitutional:      General: She is not in acute distress.    Appearance: Normal appearance.  Cardiovascular:     Rate and Rhythm: Normal rate and regular rhythm.     Heart sounds: Normal heart sounds. No murmur.  Pulmonary:     Effort: Pulmonary effort is normal. No respiratory distress.     Breath sounds: Normal breath sounds. No wheezing.  Skin:    General: Skin is warm and dry.  Neurological:     Mental Status: She is alert and oriented to person, place, and time.      MD Evaluation Airway: WNL Heart: WNL Abdomen: WNL Chest/ Lungs: WNL ASA  Classification: 3 Mallampati/Airway Score: One   Imaging: Dg Chest 2 View  Result Date: 03/28/2019 CLINICAL DATA:  Generalized weakness. EXAM: CHEST - 2 VIEW COMPARISON:  02/17/2019 and CT chest 10/13/2018. FINDINGS: Patient is rotated. Heart is enlarged, stable. Thoracic aorta is calcified. Mild bibasilar streaky opacification. No dense airspace consolidation. Tiny  left pleural effusion. Left hemidiaphragm is elevated. IMPRESSION: 1. Streaky bibasilar airspace opacification may be due to atelectasis. A viral or atypical pneumonia cannot be excluded. 2. Tiny left pleural effusion. 3.  Aortic atherosclerosis (ICD10-170.0). Electronically Signed   By: Lorin Picket M.D.   On: 03/28/2019 15:38   Ct Head Wo Contrast  Result Date: 03/28/2019 CLINICAL DATA:  Worsening weakness over the last 2 days. EXAM: CT HEAD WITHOUT CONTRAST CT CERVICAL SPINE WITHOUT CONTRAST TECHNIQUE: Multidetector CT imaging of the head and cervical spine was performed following the standard protocol without intravenous contrast. Multiplanar CT image reconstructions of the cervical spine were also generated. COMPARISON:  MRI 02/07/2019.  CT 02/05/2019 FINDINGS: CT HEAD FINDINGS Brain: Chronic atrophy and small-vessel ischemic change of the white matter. No sign of acute infarction, mass lesion, hemorrhage, obstructive hydrocephalus or extra-axial collection. Ventriculomegaly secondary to central atrophy. Vascular: There is atherosclerotic calcification of the major vessels at the base of the brain. Skull: Negative Sinuses/Orbits: Clear/normal Other: None CT CERVICAL SPINE FINDINGS Alignment: Normal Skull base and vertebrae: No fracture or primary bone lesion. Soft tissues and spinal canal: Negative Disc levels: Degenerative spondylosis from C3-4 through C7-T1 with disc space narrowing and small osteophytes. Facet osteoarthritis on the right at C3-4 and C4-5 and on the left at C4-5. Mild foraminal narrowing on the right at C3-4 and C4-5. No traumatic finding. Upper chest: Negative Other: None IMPRESSION: Head CT: No acute or traumatic finding. Atrophy and chronic small-vessel ischemic changes. Cervical spine CT: No acute or traumatic finding. Degenerative spondylosis and facet osteoarthritis. Electronically Signed   By: Nelson Chimes M.D.   On: 03/28/2019 15:47   Ct Cervical Spine Wo Contrast  Result  Date: 03/28/2019 CLINICAL DATA:  Worsening weakness over the last 2 days. EXAM: CT HEAD WITHOUT CONTRAST CT CERVICAL SPINE WITHOUT CONTRAST TECHNIQUE: Multidetector CT imaging of the head and cervical spine was performed following the standard protocol without intravenous contrast. Multiplanar CT image reconstructions of the cervical spine were also generated. COMPARISON:  MRI 02/07/2019.  CT 02/05/2019 FINDINGS: CT HEAD FINDINGS Brain: Chronic atrophy and small-vessel ischemic change of the white matter. No sign of acute infarction, mass lesion, hemorrhage, obstructive hydrocephalus or extra-axial  collection. Ventriculomegaly secondary to central atrophy. Vascular: There is atherosclerotic calcification of the major vessels at the base of the brain. Skull: Negative Sinuses/Orbits: Clear/normal Other: None CT CERVICAL SPINE FINDINGS Alignment: Normal Skull base and vertebrae: No fracture or primary bone lesion. Soft tissues and spinal canal: Negative Disc levels: Degenerative spondylosis from C3-4 through C7-T1 with disc space narrowing and small osteophytes. Facet osteoarthritis on the right at C3-4 and C4-5 and on the left at C4-5. Mild foraminal narrowing on the right at C3-4 and C4-5. No traumatic finding. Upper chest: Negative Other: None IMPRESSION: Head CT: No acute or traumatic finding. Atrophy and chronic small-vessel ischemic changes. Cervical spine CT: No acute or traumatic finding. Degenerative spondylosis and facet osteoarthritis. Electronically Signed   By: Nelson Chimes M.D.   On: 03/28/2019 15:47   Mr Angio Head Wo Contrast  Result Date: 03/29/2019 CLINICAL DATA:  85 year old female with increasing weakness, small acute right middle frontal gyrus infarct on brain MRI yesterday. History of multiple intracranial stenoses. Status post balloon angioplasty of critical right MCA stenosis on 02/05/2019. EXAM: MRA HEAD WITHOUT CONTRAST MRA NECK WITHOUT CONTRAST TECHNIQUE: Angiographic images of the  Circle of Willis were obtained using MRA technique without intravenous contrast. Angiographic images of the neck were obtained using MRA technique without intravenous contrast. Carotid stenosis measurements (when applicable) are obtained utilizing NASCET criteria, using the distal internal carotid diameter as the denominator. COMPARISON:  Brain MRI 03/28/2019. Intracranial MRA 02/07/2019, CTA head and neck 02/05/2019, and earlier. FINDINGS: MRA NECK FINDINGS 3D time-of-flight images demonstrate antegrade flow signal in the bilateral cervical carotid arteries and the cervical left vertebral artery to the skull base. Absent flow signal in the cervical right vertebral artery compatible with known right Vertebral artery occlusion. Mild irregularity at both carotid bifurcations appears stable to that on the 02/05/2019 CTA and does not appear hemodynamically significant. No left vertebral artery stenosis identified in the neck. MRA HEAD FINDINGS Antegrade flow in both ICA siphons appears stable since 02/07/2019, with mild stenosis on the left. Patent ophthalmic and right posterior communicating artery origins. Patent carotid termini, MCA and ACA origins. A 5-6 millimeter segment of lost flow signal has developed in the proximal right MCA M1 (series 9, image 8), where moderate right MCA irregularity and stenosis was present on the post angioplasty MRA. However, flow is reconstituted at the right MCA bifurcation, and the visible right MCA branches appear to remain patent. Left MCA M1 segment, left MCA trifurcation and left MCA branches appear stable and within normal limits. Chronic occlusion of the left ACA A1. The right A1, anterior communicating artery and visible ACA branches appears stable. Chronic poor flow in the distal right vertebral artery, probably partially reconstituted in a retrograde fashion. Stable antegrade flow signal in the distal left vertebral artery to the vertebrobasilar junction. Stable basilar  artery with mild irregularity and mild proximal stenosis. Patent basilar tip, SCA and PCA origins. Right posterior communicating artery is present, the left is diminutive or absent. Bilateral PCA branches appear stable with moderate to severe right P2 segment stenosis. IMPRESSION: 1. Recurrent Critical Stenosis/Near Occlusion of the Right MCA M1 segment which was angioplastied in September. Reconstituted right MCA bifurcation, with no right MCA branch occlusion identified. 2. Otherwise stable intracranial MRA including chronic left A1 occlusion, poor flow in the distal right vertebral artery, and severe right PCA stenosis. 3. Neck MRA redemonstrates chronic right Vertebral artery occlusion, with no significant left vertebral or cervical carotid artery stenosis. Salient findings discussed by telephone  with PA Rufina Falco on 03/29/2019 at 0242 hours. Electronically Signed   By: Genevie Ann M.D.   On: 03/29/2019 02:48   Mr Angio Neck Wo Contrast  Result Date: 03/29/2019 CLINICAL DATA:  85 year old female with increasing weakness, small acute right middle frontal gyrus infarct on brain MRI yesterday. History of multiple intracranial stenoses. Status post balloon angioplasty of critical right MCA stenosis on 02/05/2019. EXAM: MRA HEAD WITHOUT CONTRAST MRA NECK WITHOUT CONTRAST TECHNIQUE: Angiographic images of the Circle of Willis were obtained using MRA technique without intravenous contrast. Angiographic images of the neck were obtained using MRA technique without intravenous contrast. Carotid stenosis measurements (when applicable) are obtained utilizing NASCET criteria, using the distal internal carotid diameter as the denominator. COMPARISON:  Brain MRI 03/28/2019. Intracranial MRA 02/07/2019, CTA head and neck 02/05/2019, and earlier. FINDINGS: MRA NECK FINDINGS 3D time-of-flight images demonstrate antegrade flow signal in the bilateral cervical carotid arteries and the cervical left vertebral artery to the  skull base. Absent flow signal in the cervical right vertebral artery compatible with known right Vertebral artery occlusion. Mild irregularity at both carotid bifurcations appears stable to that on the 02/05/2019 CTA and does not appear hemodynamically significant. No left vertebral artery stenosis identified in the neck. MRA HEAD FINDINGS Antegrade flow in both ICA siphons appears stable since 02/07/2019, with mild stenosis on the left. Patent ophthalmic and right posterior communicating artery origins. Patent carotid termini, MCA and ACA origins. A 5-6 millimeter segment of lost flow signal has developed in the proximal right MCA M1 (series 9, image 8), where moderate right MCA irregularity and stenosis was present on the post angioplasty MRA. However, flow is reconstituted at the right MCA bifurcation, and the visible right MCA branches appear to remain patent. Left MCA M1 segment, left MCA trifurcation and left MCA branches appear stable and within normal limits. Chronic occlusion of the left ACA A1. The right A1, anterior communicating artery and visible ACA branches appears stable. Chronic poor flow in the distal right vertebral artery, probably partially reconstituted in a retrograde fashion. Stable antegrade flow signal in the distal left vertebral artery to the vertebrobasilar junction. Stable basilar artery with mild irregularity and mild proximal stenosis. Patent basilar tip, SCA and PCA origins. Right posterior communicating artery is present, the left is diminutive or absent. Bilateral PCA branches appear stable with moderate to severe right P2 segment stenosis. IMPRESSION: 1. Recurrent Critical Stenosis/Near Occlusion of the Right MCA M1 segment which was angioplastied in September. Reconstituted right MCA bifurcation, with no right MCA branch occlusion identified. 2. Otherwise stable intracranial MRA including chronic left A1 occlusion, poor flow in the distal right vertebral artery, and severe right  PCA stenosis. 3. Neck MRA redemonstrates chronic right Vertebral artery occlusion, with no significant left vertebral or cervical carotid artery stenosis. Salient findings discussed by telephone with PA Rufina Falco on 03/29/2019 at 0242 hours. Electronically Signed   By: Genevie Ann M.D.   On: 03/29/2019 02:48   Mr Brain Wo Contrast  Result Date: 03/28/2019 CLINICAL DATA:  85 year old female with increasing weakness for 2 days. Status post stroke in September. History of multiple severe intracranial stenoses EXAM: MRI HEAD WITHOUT CONTRAST TECHNIQUE: Multiplanar, multiecho pulse sequences of the brain and surrounding structures were obtained without intravenous contrast. COMPARISON:  Brain MRI and intracranial MRA 02/07/2019 and earlier. FINDINGS: Brain: Expected evolution of the right corona radiata infarct since September. Small new 4-5 millimeter area of right middle frontal gyrus subcortical white matter restricted diffusion on series  9, image 34 and series 22, image 21. T2 and FLAIR hyperintensity with no hemorrhage or mass effect. Developing cystic encephalomalacia in the right corona radiata. No cortical encephalomalacia identified. Small chronic infarcts in the right PICA territory. Patchy and confluent bilateral periventricular white matter T2 and FLAIR hyperintensity. Chronic microhemorrhages in the right periatrial white matter. Mild superficial siderosis along the left lateral occipital lobe is also stable on series 17, image 24. No midline shift, mass effect, evidence of mass lesion, ventriculomegaly, extra-axial collection or acute intracranial hemorrhage. Cervicomedullary junction and pituitary are within normal limits. Vascular: Major intracranial vascular flow voids are stable, with evidence of decreased flow in the distal right vertebral artery. Skull and upper cervical spine: Negative visible cervical spine. Visualized bone marrow signal is within normal limits. Sinuses/Orbits: Stable and  negative. Other: Mastoids remain clear. Scalp and face soft tissues appear negative. IMPRESSION: 1. Small acute lacunar infarct in subcortical white matter of the right middle frontal gyrus. No associated hemorrhage or mass effect. 2. Expected evolution of the right corona radiata infarct since September. Otherwise stable chronic small vessel disease. Mild left occipital lobe superficial siderosis. 3. Chronically abnormal flow in the distal right vertebral artery. Electronically Signed   By: Genevie Ann M.D.   On: 03/28/2019 19:40    Labs:  CBC: Recent Labs    03/28/19 1457 03/29/19 0806 03/29/19 1813 03/31/19 1426  WBC 9.5 7.5 8.1 8.3  HGB 12.6 11.7* 13.0 13.0  HCT 40.1 37.2 39.4 42.1  PLT 277 225 227 262    COAGS: Recent Labs    03/28/19 2114  INR 1.0  APTT 27    BMP: Recent Labs    03/07/19 1259 03/28/19 1457 03/29/19 0806 03/29/19 1813 03/31/19 1426  NA 140 140 139  --  137  K 4.3 4.2 3.8  --  3.5  CL 104 103 105  --  106  CO2 26 25 26   --  18*  GLUCOSE 92 102* 104*  --  85  BUN 17 21 17   --  12  CALCIUM 9.3 9.6 9.1  --  8.9  CREATININE 1.23* 0.86 0.71 0.77 0.69  GFRNONAA 41* >60 >60 >60 >60  GFRAA 47* >60 >60 >60 >60    LIVER FUNCTION TESTS: Recent Labs    11/25/18 1046 02/14/19 0748 03/28/19 1457 03/29/19 0806  BILITOT 0.2 0.9 1.0 0.8  AST 14 23 21  12*  ALT 12 20 10 8   ALKPHOS 90 69 97 79  PROT 6.5 6.8 7.2 6.4*  ALBUMIN 3.9 3.3* 3.7 3.3*     Assessment and Plan:  Right MCA M1 segment pre-occlusive stenosis. Plan for image-guided cerebral arteriogram with possible revascularization (angioplasty/stent placement) of right MCA M1 segment stenosis tentatively for tomorrow 04/05/2019 in IR with Dr. Estanislado Pandy. Patient will be NPO at midnight. Afebrile and WBCs WNL. Ok to proceed with Brilinta/Aspirin use per Dr. Estanislado Pandy; will hold Lovenox per IR protocol. INR 1.0 03/28/2019. P2Y12 19 PRU today.  Risks and benefits of cerebral arteriogram with  intervention were discussed with the patient including, but not limited to bleeding, infection, vascular injury, contrast induced renal failure, stroke, reperfusion hemorrhage, or even death. This interventional procedure involves the use of X-rays and because of the nature of the planned procedure, it is possible that we will have prolonged use of X-ray fluoroscopy. Potential radiation risks to you include (but are not limited to) the following: - A slightly elevated risk for cancer  several years later in life. This risk is  typically less than 0.5% percent. This risk is low in comparison to the normal incidence of human cancer, which is 33% for women and 50% for men according to the [employer]. - Radiation induced injury can include skin redness, resembling a rash, tissue breakdown / ulcers and hair loss (which can be temporary or permanent).  The likelihood of either of these occurring depends on the difficulty of the procedure and whether you are sensitive to radiation due to previous procedures, disease, or genetic conditions.  IF your procedure requires a prolonged use of radiation, you will be notified and given written instructions for further action.  It is your responsibility to monitor the irradiated area for the 2 weeks following the procedure and to notify your physician if you are concerned that you have suffered a radiation induced injury. All of the patient's questions were answered, patient is agreeable to proceed. Consent signed and in chart.   Thank you for this interesting consult.  I greatly enjoyed meeting Christy Carter and look forward to participating in their care.  A copy of this report was sent to the requesting provider on this date.  Electronically Signed: Earley Abide, PA-C 04/04/2019, 3:51 PM   I spent a total of 40 Minutes in face to face in clinical consultation, greater than 50% of which was counseling/coordinating care for right MCA M1 segment  stenosis/endovascular revascularization.

## 2019-04-04 NOTE — Progress Notes (Signed)
Pt refusing AM labs. On call provider notified. Lab will attempt again later this morning.

## 2019-04-04 NOTE — Progress Notes (Signed)
Occupational Therapy Treatment Patient Details Name: Christy Carter MRN: [id number] DOB: [birth year] Today's Date: 04/04/2019    History of present illness 85 y.o. female with medical history significant of hypertension, gout, hyperlipidemia, arthritis, hypothyroidism, recent ischemic stroke s/p and angioplasty presented with complaint of increased confusion, unsteady gait and increase left-sided weakness for 1 week.  Stroke work-up was done at [facility].  MRI brain showed small acute lacunar infarct in the subcortical white matter of the right middle frontal gyrus.  MRI head and neck also showed recurrent critical stenosis/near occlusion of the right MCA M1 segment which was angioplastied in September 2020.    OT comments  Treatment session with focus on orientation, dynamic sitting balance, sit > stand, and stand pivot transfers.  Pt oriented to [facility] once clarified that pt was in [place] and not [place].  Pt tearful during session, recalling circumstances of her life and feeling underappreciated.  Min guard throughout sitting balance with intermittent tactile cues for weight shifting and sitting balance.  Engaged in washing face and brushing hair with focus on sitting balance and use of LUE as gross assist.  Mod assist +2 for weight shift and sequencing of sit > stand and then stand pivot transfer with RW +2 for safety.   Follow Up Recommendations  SNF;Supervision/Assistance - 24 hour    Equipment Recommendations  Other (comment)(defer to next venue of care)       Precautions / Restrictions Precautions Precautions: Fall Precaution Comments: L inattention, left hemiparesis       Mobility Bed Mobility Overal bed mobility: Needs Assistance Bed Mobility: Supine to Sit     Supine to sit: HOB elevated;Mod assist     General bed mobility comments: Required increased time for initiation and sequencing with bed mobility  Transfers Overall transfer level: Needs  assistance Equipment used: Ambulation equipment used Transfers: Sit to/from Stand Sit to Stand: +2 safety/equipment;Mod assist Stand pivot transfers: Mod assist;+2 physical assistance;+2 safety/equipment       General transfer comment: Sit > stand from EOB with Mod A +2 for weight shift.        ADL either performed or assessed with clinical judgement   ADL Overall ADL's : Needs assistance/impaired     Grooming: Brushing hair;Sitting;Minimal assistance;Supervision/safety;Wash/dry face Grooming Details (indicate cue type and reason): Intermittent min guard for sitting balance and min assist to reach back of her hair Upper Body Bathing: Minimal assistance;Sitting   Lower Body Bathing: Cueing for sequencing;Maximal assistance;+2 for physical assistance;+2 for safety/equipment;Sit to/from stand;Moderate assistance Lower Body Bathing Details (indicate cue type and reason): Pt required Max assist for sit > stand for peri-care with +2 for safety Upper Body Dressing : Sitting;Min guard   Lower Body Dressing: Maximal assistance;+2 for physical assistance;+2 for safety/equipment;Sit to/from stand Lower Body Dressing Details (indicate cue type and reason): Pt required max A to don/doff socks sitting EOB, and max A +2 to power up to stand Toilet Transfer: Maximal assistance;+2 for physical assistance;+2 for safety/equipment;Moderate assistance;Stand-pivot;BSC;RW Toilet Transfer Details (indicate cue type and reason): Pt required max A +2 to power up to stand, and mod A +2 to pivot to recliner         Functional mobility during ADLs: Moderate assistance;Maximal assistance;+2 for physical assistance;+2 for safety/equipment;Rolling walker General ADL Comments: Mod-max assist +2 for sit > stand and then stand pivot with RW to recliner.  Pt very tearful throughout session, recalling life circumstances  Cognition Arousal/Alertness: Awake/alert Behavior During Therapy: WFL for  tasks assessed/performed Overall Cognitive Status: Impaired/Different from baseline Area of Impairment: Attention;Memory;Following commands;Safety/judgement;Awareness;Problem solving                 Orientation Level: (able to identify [facility] when clarified pt in [place]) Current Attention Level: Sustained Memory: Decreased short-term memory Following Commands: Follows one step commands with increased time;Follows one step commands inconsistently Safety/Judgement: Decreased awareness of safety;Decreased awareness of deficits Awareness: Intellectual Problem Solving: Slow processing;Decreased initiation;Difficulty sequencing;Requires verbal cues;Requires tactile cues                     Pertinent Vitals/ Pain       Pain Assessment: No/denies pain         Frequency  Min 2X/week        Progress Toward Goals  OT Goals(current goals can now be found in the care plan section)  Progress towards OT goals: Progressing toward goals  Acute Rehab OT Goals Patient Stated Goal: go home OT Goal Formulation: With patient Time For Goal Achievement: 04/13/19 Potential to Achieve Goals: Good  Plan Discharge plan remains appropriate    Co-evaluation    PT/OT/SLP Co-Evaluation/Treatment: Yes Reason for Co-Treatment: Complexity of the patient's impairments (multi-system involvement);Necessary to address cognition/behavior during functional activity;For patient/therapist safety;To address functional/ADL transfers   OT goals addressed during session: ADL's and self-care      AM-PAC OT "6 Clicks" Daily Activity     Outcome Measure   Help from another person eating meals?: None Help from another person taking care of personal grooming?: A Little Help from another person toileting, which includes using toliet, bedpan, or urinal?: A Lot Help from another person bathing (including washing, rinsing, drying)?: A Lot Help from another person to put on and  taking off regular upper body clothing?: A Little Help from another person to put on and taking off regular lower body clothing?: A Lot 6 Click Score: 16    End of Session Equipment Utilized During Treatment: Gait belt;Rolling walker  OT Visit Diagnosis: Unsteadiness on feet (R26.81);Other abnormalities of gait and mobility (R26.89);Muscle weakness (generalized) (M62.81);Other symptoms and signs involving cognitive function;Hemiplegia and hemiparesis Hemiplegia - Right/Left: Left Hemiplegia - dominant/non-dominant: Non-Dominant Hemiplegia - caused by: Cerebral infarction   Activity Tolerance Patient tolerated treatment well   Patient Left in chair;with call bell/phone within reach;with chair alarm set   Nurse Communication Mobility status        Time: [id number] OT Time Calculation (min): 32 min  Charges: OT General Charges $OT Visit: 1 Visit OT Treatments $Self Care/Home Management : 8-22 mins    Simonne Come [phone] 04/04/2019, 12:21 PM

## 2019-04-05 ENCOUNTER — Telehealth: Payer: Self-pay

## 2019-04-05 ENCOUNTER — Other Ambulatory Visit (HOSPITAL_COMMUNITY): Payer: Medicare Other

## 2019-04-05 ENCOUNTER — Encounter (HOSPITAL_COMMUNITY): Payer: Self-pay | Admitting: Interventional Radiology

## 2019-04-05 LAB — CBC WITH DIFFERENTIAL/PLATELET
Abs Immature Granulocytes: 0.05 10*3/uL (ref 0.00–0.07)
Basophils Absolute: 0.1 10*3/uL (ref 0.0–0.1)
Basophils Relative: 1 %
Eosinophils Absolute: 0.5 10*3/uL (ref 0.0–0.5)
Eosinophils Relative: 8 %
HCT: 39.2 % (ref 36.0–46.0)
Hemoglobin: 12.6 g/dL (ref 12.0–15.0)
Immature Granulocytes: 1 %
Lymphocytes Relative: 22 %
Lymphs Abs: 1.3 10*3/uL (ref 0.7–4.0)
MCH: 29.7 pg (ref 26.0–34.0)
MCHC: 32.1 g/dL (ref 30.0–36.0)
MCV: 92.5 fL (ref 80.0–100.0)
Monocytes Absolute: 0.7 10*3/uL (ref 0.1–1.0)
Monocytes Relative: 11 %
Neutro Abs: 3.4 10*3/uL (ref 1.7–7.7)
Neutrophils Relative %: 57 %
Platelets: 276 10*3/uL (ref 150–400)
RBC: 4.24 MIL/uL (ref 3.87–5.11)
RDW: 15.1 % (ref 11.5–15.5)
WBC: 6 10*3/uL (ref 4.0–10.5)
nRBC: 0 % (ref 0.0–0.2)

## 2019-04-05 LAB — URINALYSIS, COMPLETE (UACMP) WITH MICROSCOPIC
Bilirubin Urine: NEGATIVE
Glucose, UA: NEGATIVE mg/dL
Hgb urine dipstick: NEGATIVE
Ketones, ur: NEGATIVE mg/dL
Leukocytes,Ua: NEGATIVE
Nitrite: NEGATIVE
Protein, ur: NEGATIVE mg/dL
Specific Gravity, Urine: 1.012 (ref 1.005–1.030)
pH: 6 (ref 5.0–8.0)

## 2019-04-05 LAB — APTT: aPTT: 32 seconds (ref 24–36)

## 2019-04-05 MED ORDER — CEFAZOLIN SODIUM-DEXTROSE 2-4 GM/100ML-% IV SOLN
INTRAVENOUS | Status: AC
  Filled 2019-04-05: qty 100

## 2019-04-05 MED ORDER — CEFAZOLIN SODIUM-DEXTROSE 2-4 GM/100ML-% IV SOLN
2.0000 g | INTRAVENOUS | Status: AC
  Filled 2019-04-05 (×2): qty 100

## 2019-04-05 MED ORDER — SODIUM CHLORIDE 0.9 % IV SOLN
INTRAVENOUS | Status: AC
  Administered 2019-04-05: 04:00:00 via INTRAVENOUS

## 2019-04-05 MED ORDER — SODIUM CHLORIDE 0.9 % IV SOLN
INTRAVENOUS | Status: DC
  Administered 2019-04-05: 12:00:00 via INTRAVENOUS

## 2019-04-05 NOTE — Care Management Important Message (Signed)
Important Message  Patient Details  Name: Christy Carter MRN: [id number] Date of Birth: 1934-03-24   Medicare Important Message Given:  Yes     Orbie Pyo 04/05/2019, 8:47 AM

## 2019-04-05 NOTE — Progress Notes (Signed)
STROKE TEAM PROGRESS NOTE   INTERVAL HISTORY Patient is lying comfortably in bed.  She has no questions.  Vital signs stable.  Elective right MCA angioplasty stenting is planned for later today.  Her daughter is at the bedside.  Questions answered.  Vitals:   04/05/19 0332 04/05/19 0339 04/05/19 0900 04/05/19 0932  BP: (!) 125/108 (!) 119/34  (!) 159/43  Pulse: 65 65  71  Resp: 19 18 18  (!) 22  Temp: (!) 97.5 F (36.4 C)   97.6 F (36.4 C)  TempSrc: Oral   Oral  SpO2: 95%   96%    CBC:  Recent Labs  Lab 03/31/19 1426 04/05/19 0307  WBC 8.3 6.0  NEUTROABS  --  3.4  HGB 13.0 12.6  HCT 42.1 39.2  MCV 97.5 92.5  PLT 262 [id number]    Basic Metabolic Panel:  Recent Labs  Lab 03/29/19 1813 03/31/19 1426  NA  --  137  K  --  3.5  CL  --  106  CO2  --  18*  GLUCOSE  --  85  BUN  --  12  CREATININE 0.77 0.69  CALCIUM  --  8.9   Lipid Panel:     Component Value Date/Time   CHOL 132 03/29/2019 0806   CHOL 98 09/07/2013 0358   TRIG 127 03/29/2019 0806   TRIG 63 09/07/2013 0358   HDL 41 03/29/2019 0806   HDL 43 09/07/2013 0358   CHOLHDL 3.2 03/29/2019 0806   VLDL 25 03/29/2019 0806   VLDL 13 09/07/2013 0358   LDLCALC 66 03/29/2019 0806   LDLCALC 42 09/07/2013 0358   HgbA1c:  Lab Results  Component Value Date   HGBA1C 5.5 03/29/2019   Urine Drug Screen: No results found for: LABOPIA, COCAINSCRNUR, LABBENZ, AMPHETMU, THCU, LABBARB  Alcohol Level No results found for: ETH  IMAGING past 24h No results found.  PHYSICAL EXAM   General - Well nourished, well developed, in no apparent distress.  Ophthalmologic - fundi not visualized due to noncooperation.  Cardiovascular - Regular rhythm and rate.  Mental Status -  Awake alert oriented to time and place only.  Diminished attention, registration and recall.  Poor insight into her illness.  Speech is fluent without aphasia or dysarthria.  Follows only simple midline and one-step commands.  Cranial Nerves II - XII  - II - Visual field intact OU. III, IV, VI - Extraocular movements intact. V - Facial sensation intact bilaterally. VII - Facial movement intact bilaterally. VIII - Hearing & vestibular intact bilaterally. X - Palate elevates symmetrically. XI - Chin turning & shoulder shrug intact bilaterally. XII - Tongue protrusion intact.  Motor Strength - The patient's strength was normal in right upper and lower extremities, however, left UE and LE 4+/5. Bulk was normal and fasciculations were absent.   Motor Tone - Muscle tone was assessed at the neck and appendages and was normal.  Reflexes - The patient's reflexes were symmetrical in all extremities    Sensory - Light touch, temperature/pinprick were assessed and were symmetrical.    Coordination - The patient had normal movements in the right hand with no ataxia or dysmetria, left FTN mild dysmetria.  Tremor was absent.  Gait and Station - deferred.   ASSESSMENT/PLAN Christy Carter is a 85 y.o. female with history of R MCA infarct s/p R M2 angioplasty in Sept 2020 presenting to [facility] 03/28/2019 with worsening L HP for the past 1 week. MRI  w/ new R MCA infarct with recurrent critical stenosis/near occlusion R M1 on MRA. Transferred to [facility].   Stroke:   Small subcortical white matter R frontal lobe infarct in setting of recurrent critical R M1 stenosis/near occlusion   CT head no acute abnormality. Small vessel disease. Atrophy.   CT CS no traumatic finding  MRI small subcortical white matter R frontal lobe infarct. expected evolution R corona radiata infarct. Abnormal flow distal R VA.   MRA Head  Recurrent critical stenosis/near occlusion R M1 s/p angioplasty in 01/2019 w/ reconstituted R MCA. Othewise stable chronic L A1 occlusion, poor flow distal R VA, severe R PCA stenosis.   MRA  Neck stable chronic R VA occlusion   Cerebral angio ([name]) severe preocclusive stenosis of  right M1  Plan for right M1 stent next Tuesday     LDL 66  HgbA1c 5.5  P2Y12 68  Lovenox 40 mg sq daily for VTE prophylaxis  aspirin 81 mg daily and Brilinta (ticagrelor) 90 mg bid prior to admission, now on aspirin 81 mg daily and Brilinta (ticagrelor) 90 mg bid.   Therapy recommendations:  SNF   Disposition:  pending (has bed offers)  History of Stroke/TIA  01/2019 - R watershed territory infarct in setting of severe stenosis right M1 segment s/p IR w/ partial revascularization AND possible small left lateral posterior temporal lobe infarct AND R middle cerebellar peduncle in setting of diffuse intracranial stenosis as likely etiology. EF 65-70%, LDL 139 and A1C 5.6. on ASA and brilinta. D/c to CIR.   08/2013 - TIA, vertigo after toileting during the night w/ AP, pressure and L sided numbness, at [facility])  [phone] - stroke, residual LUE HP per pt. details not available  Baseline dementia  Agitation and sundowning in Sept led to tx w/ Seroquel  Agitated this am, on b/l hand mittens  On seroquel Qhs  Hypertension  Stable . Permissive hypertension (OK if < 220/120) but gradually normalize in 5-7 days . Long-term BP goal 130-150 given right M1 high grade stenosis  Hyperlipidemia  Home meds:  lipitor 40, resumed in hospital  LDL 66, goal < 70  Continue statin at discharge  Other Stroke Risk Factors  Advanced age  Former Cigarette smoker  Obesity, BMI 29.13, recommend weight loss, diet and exercise as appropriate   Mild CAD  Obstructive sleep apnea  Other Active Problems  Hypothyroidism. TSH 17.492  Gout on allopurinol   Depression on prozac  GERD on PPI  Hospital day # 7  Await elective right MCA repeat angioplasty/stenting by Dr. Estanislado Pandy today.  Discussed with Dr. Estanislado Pandy, Dr. Pietro Cassis patient and daughter and answered questions..  Continue ongoing management.  Greater than 50% time during this 25-minute visit was  spent on discussion about carotid angioplasty and stenting discussion with care team. Antony Contras, MD Medical Director [place] Pager: [phone] 04/05/2019 11:10 AM   To contact Stroke Continuity provider, please refer to [email or web address] After hours, contact General Neurology

## 2019-04-05 NOTE — Progress Notes (Signed)
NIR.  Patient with history of CVA secondary to right MCA M1 segment stenosis. She had tentative plans for image-guided cerebral arteriogram with possible revascularization (angioplasty/stent placement) of right MCA M1 segment stenosis today with Dr. Estanislado Pandy.  Unfortunately, there was an emergent Code Stroke procedure that required Dr. Arlean Hopping immediate attention at this time. Because of this, patient's procedure will not occur today, and will be rescheduled tentatively for tomorrow 04/06/2019 at 0830 with Dr. Estanislado Pandy. Will restart diet today, patient to be NPO at midnight tonight in anticipation for procedure tomorrow. Ok to proceed with Brilinta/Aspirin use per Dr. Estanislado Pandy. Delsa Sale, RN aware of orders. Informed patient and daughter of above. All questions answered and concerns addressed.  NIR to follow   Ronney Lion, PA-C 04/05/2019, 2:33 PM

## 2019-04-05 NOTE — Progress Notes (Signed)
Physical Therapy Treatment Patient Details Name: Christy Carter MRN: [id number] DOB: 1934-02-09 Today's Date: 04/05/2019    History of Present Illness 85 y.o. female with medical history significant of hypertension, gout, hyperlipidemia, arthritis, hypothyroidism, recent ischemic stroke s/p and angioplasty presented with complaint of increased confusion, unsteady gait and increase left-sided weakness for 1 week.  Stroke work-up was done at [facility].  MRI brain showed small acute lacunar infarct in the subcortical white matter of the right middle frontal gyrus.  MRI head and neck also showed recurrent critical stenosis/near occlusion of the right MCA M1 segment which was angioplastied in September 2020.     PT Comments    Pt performed transfer from bed to commode and commode to recliner with heavy max assistance and Left sided lean.  Pt is slow to progress and continues to present with urinary incontinence with exertional movement.  Based on need for increased assistance this session she continues to benefit from skilled rehab at [facility] before returning home.     Follow Up Recommendations  SNF;Supervision/Assistance - 24 hour     Equipment Recommendations  None recommended by PT    Recommendations for Other Services       Precautions / Restrictions Precautions Precautions: Fall Precaution Comments: L inattention, left hemiparesis Restrictions Weight Bearing Restrictions: No    Mobility  Bed Mobility Overal bed mobility: Needs Assistance Bed Mobility: Supine to Sit     Supine to sit: HOB elevated;Mod assist     General bed mobility comments: Required increased time for initiation and sequencing with bed mobility, Mod+1 to elevate trunk into sitting, advance LEs to edge of bed and to scoot hips closer to edge of bed.  Transfers Overall transfer level: Needs assistance Equipment used: Rolling walker (2 wheeled) Transfers: Sit to/from Stand Sit to Stand: Max assist;+2 physical  assistance Stand pivot transfers: Max assist;+2 physical assistance       General transfer comment: Pt performed transfer from bed to bed side commode and commode to recliner.  Pt with significant L lateral lean which made progressing to pivot difficult.  Also with decreased assistance to mobilize RW and keep L hand on RW for upper extremity support.  Ambulation/Gait                 Stairs             Wheelchair Mobility    Modified Rankin (Stroke Patients Only)       Balance Overall balance assessment: Needs assistance Sitting-balance support: Single extremity supported;Feet unsupported Sitting balance-Leahy Scale: Fair Sitting balance - Comments: difficulty with reciprocal scooting Postural control: Left lateral lean Standing balance support: Bilateral upper extremity supported;During functional activity Standing balance-Leahy Scale: Poor Standing balance comment: Pt required BUE support on RW and +2 assist to maintain balance                            Cognition Arousal/Alertness: Awake/alert Behavior During Therapy: WFL for tasks assessed/performed Overall Cognitive Status: Impaired/Different from baseline Area of Impairment: Attention;Memory;Following commands;Safety/judgement;Awareness;Problem solving                 Orientation Level: Disoriented to;Place;Time;Situation(reports she is in [place]) Current Attention Level: Sustained Memory: Decreased short-term memory Following Commands: Follows one step commands with increased time;Follows one step commands inconsistently Safety/Judgement: Decreased awareness of safety;Decreased awareness of deficits Awareness: Intellectual Problem Solving: Slow processing;Decreased initiation;Difficulty sequencing;Requires verbal cues;Requires tactile cues General Comments: Pt with no recognition of  working with therapist two days ago. poor awareness of safety/deficits; stress incontinence.       Exercises      General Comments        Pertinent Vitals/Pain Pain Assessment: No/denies pain Faces Pain Scale: No hurt Pain Location: right foot with weightbearing, tender to palpation along dorsal aspect Pain Descriptors / Indicators: Grimacing;Sore Pain Intervention(s): Monitored during session;Repositioned    Home Living                      Prior Function            PT Goals (current goals can now be found in the care plan section) Acute Rehab PT Goals Patient Stated Goal: go home Potential to Achieve Goals: Fair Progress towards PT goals: Progressing toward goals    Frequency    Min 3X/week      PT Plan Current plan remains appropriate    Co-evaluation              AM-PAC PT "6 Clicks" Mobility   Outcome Measure  Help needed turning from your back to your side while in a flat bed without using bedrails?: A Lot Help needed moving from lying on your back to sitting on the side of a flat bed without using bedrails?: A Lot Help needed moving to and from a bed to a chair (including a wheelchair)?: A Lot Help needed standing up from a chair using your arms (e.g., wheelchair or bedside chair)?: A Lot Help needed to walk in hospital room?: A Lot Help needed climbing 3-5 steps with a railing? : Total 6 Click Score: 11    End of Session Equipment Utilized During Treatment: Gait belt Activity Tolerance: Patient tolerated treatment well Patient left: in bed;in chair;with chair alarm set Nurse Communication: Mobility status;Need for lift equipment PT Visit Diagnosis: Muscle weakness (generalized) (M62.81);Difficulty in walking, not elsewhere classified (R26.2) Pain - Right/Left: Right Pain - part of body: Ankle and joints of foot     Time: 1459-1533 PT Time Calculation (min) (ACUTE ONLY): 34 min  Charges:  $Therapeutic Activity: 23-37 mins                     Erasmo Leventhal , PTA Acute Rehabilitation Services Pager [phone] Office  [phone]     Christy Carter 04/05/2019, 5:40 PM

## 2019-04-05 NOTE — Progress Notes (Signed)
PROGRESS NOTE  Christy Carter  DOB: 03-07-1934  PCP: Lavera Guise, MD [id number]  DOA: 03/29/2019  LOS: 7 days   Cc: Left-sided weakness for a week  Brief narrative: Christy Carter is a 85 y.o. female with PMH of hypertension, gout, hyperlipidemia, arthritis, hypothyroidism, recent ischemic stroke s/p and angioplasty who presented with complaint of increased confusion, unsteady gait and increase left-sided weakness since 1 week.   Stroke work-up was started at [facility].   MRI brain showed small acute lacunar infarct in the subcortical white matter of the right middle frontal gyrus.  MRI head and neck also showed recurrent critical stenosis/near occlusion of the right MCA M1 segment which was angioplastied in September 2020.   Admitting physician discussed with neurologist on-call Dr. Amie Portland who recommended transfer to [facility] for possible endovascular evaluation to assess if she would be a candidate for repeat angioplasty versus stent. Neurology and vascular surgery consult were obtained.  Subjective: Patient was seen and examined this morning.  Lying down in bed.  Not in distress.  This morning, patient was waiting for the procedure.  Apparently the procedure could not happen because of an emergent code stroke procedure requiring immediate attention.  Procedure is postponed for tomorrow.  Assessment/Plan: Acute ischemic stroke: -Secondary to recurrent critical stenosis/near occlusion of the right MCA M1 segment. -MRI brain, MRA head and neck findings as above. -Neurology consultation appreciated.  -Continue aspirin, atorvastatin & Brilinta -PT eval was obtained.  Recommend SNF placement.  Intracranial vascular stenosis -MRI head and neck showed recurrent critical stenosis/near occlusion of the right MCA M1 segment which was angioplastied in September 2020. -Seen by neuro intervention radiology Dr. Estanislado Pandy.  Diagnostic CT angiogram on 11/19.  Severe preocclusive  stenosis of the right MCA M1 segment was identified.   -Cerebral arteriogram with possible revascularization planned.  Could not happen today because of above-mentioned reason.  Plan for tomorrow.  Hypertension -Home meds include clonidine weekly patch and oral Zebeta.  Blood pressure continued to rise despite resumption of both medicines both resumed.   -Added amlodipine 10 mg daily.  Blood pressure is better controlled now. Continue hydralazine IV as needed.   Hypothyroidism:  -Continue levothyroxine.  Mixed hyperlipidemia:  -Reviewed lipid panel from September 2020.   -Continue statin.  Gout: Stable we will continue allopurinol.  Depression: Cont. Prozac & Seroquel  GERD: cont. PPI.  Mobility: PT/OT consultation obtained. SNF recommended. DVT prophylaxis:  Lovenox subcu Code Status:   Code Status: Full Code  Family Communication:  Expected Discharge:  Planned for stenting of the right MCA on 11/25  Consultants:  Neurology, neuroradiology  Procedures:    Antimicrobials: Anti-infectives (From admission, onward)   Start     Dose/Rate Route Frequency Ordered Stop   04/05/19 1146  ceFAZolin (ANCEF) 2-4 GM/100ML-% IVPB    Note to Pharmacy: Nyoka Cowden   : cabinet override      04/05/19 1146 04/05/19 2359   04/05/19 0845  ceFAZolin (ANCEF) IVPB 2g/100 mL premix     2 g 200 mL/hr over 30 Minutes Intravenous To Radiology 04/05/19 0831 04/06/19 0845      Diet Order            Diet NPO time specified Except for: Sips with Meds  Diet effective midnight        DIET DYS 3 Room service appropriate? Yes; Fluid consistency: Thin  Diet effective now              Infusions:  .  ceFAZolin    .  ceFAZolin (ANCEF) IV      Scheduled Meds: . allopurinol  100 mg Oral QHS  . amLODipine  10 mg Oral Daily  . aspirin  81 mg Oral Daily  . atorvastatin  40 mg Oral q1800  . bisoprolol  2.5 mg Oral Daily  . FLUoxetine  10 mg Oral Daily  . levothyroxine  150 mcg Oral  Q0600  . mupirocin ointment  1 application Nasal BID  . pantoprazole  40 mg Oral Daily  . QUEtiapine  25 mg Oral QHS  . ticagrelor  90 mg Oral BID    PRN meds: acetaminophen **OR** acetaminophen (TYLENOL) oral liquid 160 mg/5 mL **OR** acetaminophen, hydrALAZINE   Objective: Vitals:   04/05/19 0932 04/05/19 1535  BP: (!) 159/43 (!) 128/51  Pulse: 71 65  Resp: (!) 22 18  Temp: 97.6 F (36.4 C) 97.8 F (36.6 C)  SpO2: 96% 98%    Intake/Output Summary (Last 24 hours) at 04/05/2019 1553 Last data filed at 04/05/2019 1100 Gross per 24 hour  Intake 730.54 ml  Output 250 ml  Net 480.54 ml   There were no vitals filed for this visit. Weight change:  There is no height or weight on file to calculate BMI.   Physical Exam: General exam: Lying down in bed.  Not in physical distress Skin: No rashes, lesions or ulcers. HEENT: Atraumatic, normocephalic, supple neck, no obvious bleeding Lungs: Clear to auscultation bilaterally CVS: Regular rate and rhythm, no murmur GI/Abd soft, nontender, nondistended, bowel sound present CNS: Alert, awake, oriented x3 Psychiatry: Mood appropriate Extremities: No pedal edema, no calf tenderness.  Data Review: I have personally reviewed the laboratory data and studies available.  Recent Labs  Lab 03/29/19 1813 03/31/19 1426 04/05/19 0307  WBC 8.1 8.3 6.0  NEUTROABS  --   --  3.4  HGB 13.0 13.0 12.6  HCT 39.4 42.1 39.2  MCV 92.1 97.5 92.5  PLT 227 262 276   Recent Labs  Lab 03/29/19 1813 03/31/19 1426  NA  --  137  K  --  3.5  CL  --  106  CO2  --  18*  GLUCOSE  --  85  BUN  --  12  CREATININE 0.77 0.69  CALCIUM  --  8.9    Terrilee Croak, MD  [facility] 04/05/2019

## 2019-04-05 NOTE — Telephone Encounter (Signed)
PT WAS SCHEDULED FOR AN APPT 04-11-19 AT THE TIME OF CONFIRMING FAMILY MEMBER ADVISED PT HAD ANOTHER STROKE AND ADVISED ME TO CX THE APPOINTMENT.

## 2019-04-05 NOTE — TOC Progression Note (Signed)
Transition of Care [facility]) - Progression Note    Patient Details  Name: Christy Carter MRN: [id number] Date of Birth: Oct 07, 1933  Transition of Care [facility]) CM/SW [place], [place] Work Phone Number: 04/05/2019, 1:17 PM  Clinical Narrative:     MSW Intern re-faxed insurance authorization to [facility] in preparation for her discharge tomorrow. SW will continue to follow.  Expected Discharge Plan: Skilled Nursing Facility Barriers to Discharge: Ship broker, Continued Medical Work up  Expected Discharge Plan and Services Expected Discharge Plan: [place] Choice: [place] arrangements for the past 2 months: Single Family Home                 DME Arranged: Air overlay mattress                     Social Determinants of Health (SDOH) Interventions    Readmission Risk Interventions No flowsheet data found.

## 2019-04-05 NOTE — Anesthesia Preprocedure Evaluation (Addendum)
Anesthesia Evaluation  Patient identified by MRN, date of birth, ID band Patient awake    Reviewed: Allergy & Precautions, NPO status , Patient's Chart, lab work & pertinent test results  Airway Mallampati: II  TM Distance: >3 FB Neck ROM: Full    Dental  (+) Dental Advisory Given   Pulmonary sleep apnea , former smoker,    breath sounds clear to auscultation       Cardiovascular hypertension, Pt. on medications and Pt. on home beta blockers  Rhythm:Regular Rate:Normal  Normal EF. Valves okay   Neuro/Psych CVA    GI/Hepatic Neg liver ROS, GERD  ,  Endo/Other  Hypothyroidism   Renal/GU Renal disease     Musculoskeletal  (+) Arthritis ,   Abdominal   Peds  Hematology negative hematology ROS (+)   Anesthesia Other Findings   Reproductive/Obstetrics                           Lab Results  Component Value Date   WBC 6.0 04/05/2019   HGB 12.6 04/05/2019   HCT 39.2 04/05/2019   MCV 92.5 04/05/2019   PLT 276 04/05/2019   Lab Results  Component Value Date   CREATININE 0.69 03/31/2019   BUN 12 03/31/2019   NA 137 03/31/2019   K 3.5 03/31/2019   CL 106 03/31/2019   CO2 18 (L) 03/31/2019    Anesthesia Physical Anesthesia Plan  ASA: III  Anesthesia Plan: General   Post-op Pain Management:    Induction: Intravenous  PONV Risk Score and Plan: 3 and Dexamethasone, Ondansetron and Treatment may vary due to age or medical condition  Airway Management Planned: Oral ETT  Additional Equipment: Arterial line  Intra-op Plan:   Post-operative Plan: Extubation in OR and Possible Post-op intubation/ventilation  Informed Consent: I have reviewed the patients History and Physical, chart, labs and discussed the procedure including the risks, benefits and alternatives for the proposed anesthesia with the patient or authorized representative who has indicated his/her understanding and acceptance.      Dental advisory given  Plan Discussed with: CRNA  Anesthesia Plan Comments:        Anesthesia Quick Evaluation

## 2019-04-06 ENCOUNTER — Inpatient Hospital Stay (HOSPITAL_COMMUNITY): Payer: Medicare Other

## 2019-04-06 ENCOUNTER — Encounter (HOSPITAL_COMMUNITY)
Admission: EM | Disposition: A | Discharge status: Skilled Nursing Facility | Payer: Self-pay | Source: Other Acute Inpatient Hospital | Attending: Internal Medicine

## 2019-04-06 ENCOUNTER — Encounter (HOSPITAL_COMMUNITY): Payer: Self-pay | Admitting: *Deleted

## 2019-04-06 HISTORY — PX: RADIOLOGY WITH ANESTHESIA: SHX6223

## 2019-04-06 HISTORY — PX: IR INTRA CRAN STENT: IMG2345

## 2019-04-06 LAB — POCT ACTIVATED CLOTTING TIME
Activated Clotting Time: 147 seconds
Activated Clotting Time: 180 seconds

## 2019-04-06 LAB — MRSA PCR SCREENING: MRSA by PCR: NEGATIVE

## 2019-04-06 LAB — APTT: aPTT: 163 seconds (ref 24–36)

## 2019-04-06 LAB — HEPARIN LEVEL (UNFRACTIONATED): Heparin Unfractionated: 0.19 IU/mL — ABNORMAL LOW (ref 0.30–0.70)

## 2019-04-06 SURGERY — RADIOLOGY WITH ANESTHESIA
Anesthesia: General

## 2019-04-06 MED ORDER — GLYCOPYRROLATE 0.2 MG/ML IJ SOLN
INTRAMUSCULAR | Status: DC | PRN
  Administered 2019-04-06: 0.1 mg via INTRAVENOUS

## 2019-04-06 MED ORDER — ACETAMINOPHEN 325 MG PO TABS
650.0000 mg | ORAL_TABLET | ORAL | Status: DC | PRN
  Administered 2019-04-09 – 2019-04-10 (×2): 650 mg via ORAL
  Filled 2019-04-06 (×2): qty 2

## 2019-04-06 MED ORDER — SODIUM CHLORIDE 0.9 % IV SOLN
INTRAVENOUS | Status: DC
  Administered 2019-04-06 (×2): via INTRAVENOUS

## 2019-04-06 MED ORDER — TICAGRELOR 90 MG PO TABS: 90.0000 mg | ORAL_TABLET | Freq: Two times a day (BID) | ORAL | Status: DC

## 2019-04-06 MED ORDER — SODIUM CHLORIDE 0.9 % IV SOLN
INTRAVENOUS | Status: DC
  Administered 2019-04-06 – 2019-04-08 (×4): via INTRAVENOUS

## 2019-04-06 MED ORDER — DEXAMETHASONE SODIUM PHOSPHATE 10 MG/ML IJ SOLN
INTRAMUSCULAR | Status: DC | PRN
  Administered 2019-04-06: 10 mg via INTRAVENOUS

## 2019-04-06 MED ORDER — LIDOCAINE 2% (20 MG/ML) 5 ML SYRINGE
INTRAMUSCULAR | Status: DC | PRN
  Administered 2019-04-06: 40 mg via INTRAVENOUS

## 2019-04-06 MED ORDER — CLEVIDIPINE BUTYRATE 0.5 MG/ML IV EMUL
0.0000 mg/h | INTRAVENOUS | Status: AC
  Administered 2019-04-06: 12:00:00 2 mg/h via INTRAVENOUS
  Administered 2019-04-06: 5 mg/h via INTRAVENOUS
  Administered 2019-04-07: 3 mg/h via INTRAVENOUS
  Administered 2019-04-07: 8 mg/h via INTRAVENOUS
  Administered 2019-04-07: 7 mg/h via INTRAVENOUS
  Filled 2019-04-06 (×4): qty 50

## 2019-04-06 MED ORDER — CEFAZOLIN SODIUM-DEXTROSE 2-3 GM-%(50ML) IV SOLR
INTRAVENOUS | Status: DC | PRN
  Administered 2019-04-06: 2 g via INTRAVENOUS

## 2019-04-06 MED ORDER — NITROGLYCERIN 1 MG/10 ML FOR IR/CATH LAB
INTRA_ARTERIAL | Status: AC
  Filled 2019-04-06: qty 10

## 2019-04-06 MED ORDER — CHLORHEXIDINE GLUCONATE CLOTH 2 % EX PADS
6.0000 | MEDICATED_PAD | Freq: Every day | CUTANEOUS | Status: DC
  Administered 2019-04-06 – 2019-04-11 (×5): 6 via TOPICAL

## 2019-04-06 MED ORDER — ROCURONIUM BROMIDE 10 MG/ML (PF) SYRINGE
PREFILLED_SYRINGE | INTRAVENOUS | Status: DC | PRN
  Administered 2019-04-06 (×2): 50 mg via INTRAVENOUS

## 2019-04-06 MED ORDER — TICAGRELOR 90 MG PO TABS
90.0000 mg | ORAL_TABLET | Freq: Two times a day (BID) | ORAL | Status: DC
  Administered 2019-04-06 – 2019-04-11 (×10): 90 mg via ORAL
  Filled 2019-04-06 (×10): qty 1

## 2019-04-06 MED ORDER — HALOPERIDOL LACTATE 5 MG/ML IJ SOLN
2.0000 mg | Freq: Four times a day (QID) | INTRAMUSCULAR | Status: DC | PRN
  Administered 2019-04-06: 2 mg via INTRAVENOUS
  Filled 2019-04-06: qty 1

## 2019-04-06 MED ORDER — ASPIRIN 81 MG PO CHEW
81.0000 mg | CHEWABLE_TABLET | Freq: Every day | ORAL | Status: DC
  Administered 2019-04-07 – 2019-04-11 (×5): 81 mg via ORAL
  Filled 2019-04-06 (×5): qty 1

## 2019-04-06 MED ORDER — ACETAMINOPHEN 160 MG/5ML PO SOLN: 650.0000 mg | ORAL | Status: DC | PRN

## 2019-04-06 MED ORDER — EPHEDRINE SULFATE-NACL 50-0.9 MG/10ML-% IV SOSY
PREFILLED_SYRINGE | INTRAVENOUS | Status: DC | PRN
  Administered 2019-04-06: 5 mg via INTRAVENOUS
  Administered 2019-04-06: 15 mg via INTRAVENOUS

## 2019-04-06 MED ORDER — IOHEXOL 300 MG/ML  SOLN
150.0000 mL | Freq: Once | INTRAMUSCULAR | Status: AC | PRN
  Administered 2019-04-06: 60 mL via INTRA_ARTERIAL

## 2019-04-06 MED ORDER — EPTIFIBATIDE 20 MG/10ML IV SOLN
INTRAVENOUS | Status: AC
  Filled 2019-04-06: qty 10

## 2019-04-06 MED ORDER — HEPARIN (PORCINE) 25000 UT/250ML-% IV SOLN
500.0000 [IU]/h | INTRAVENOUS | Status: DC
  Administered 2019-04-06: 500 [IU]/h via INTRAVENOUS

## 2019-04-06 MED ORDER — PROPOFOL 10 MG/ML IV BOLUS
INTRAVENOUS | Status: DC | PRN
  Administered 2019-04-06: 110 mg via INTRAVENOUS

## 2019-04-06 MED ORDER — ASPIRIN 81 MG PO CHEW: 81.0000 mg | CHEWABLE_TABLET | Freq: Every day | ORAL | Status: DC

## 2019-04-06 MED ORDER — HEPARIN (PORCINE) 25000 UT/250ML-% IV SOLN
INTRAVENOUS | Status: AC
  Filled 2019-04-06: qty 250

## 2019-04-06 MED ORDER — FENTANYL CITRATE (PF) 100 MCG/2ML IJ SOLN
INTRAMUSCULAR | Status: AC
  Filled 2019-04-06: qty 2

## 2019-04-06 MED ORDER — HEPARIN (PORCINE) 25000 UT/250ML-% IV SOLN
500.0000 [IU]/h | INTRAVENOUS | Status: DC
  Administered 2019-04-06: 15:00:00 500 [IU]/h via INTRAVENOUS

## 2019-04-06 MED ORDER — CLEVIDIPINE BUTYRATE 0.5 MG/ML IV EMUL
INTRAVENOUS | Status: AC
  Filled 2019-04-06: qty 50

## 2019-04-06 MED ORDER — SUGAMMADEX SODIUM 200 MG/2ML IV SOLN
INTRAVENOUS | Status: DC | PRN
  Administered 2019-04-06: 350 mg via INTRAVENOUS

## 2019-04-06 MED ORDER — EPTIFIBATIDE 20 MG/10ML IV SOLN
INTRAVENOUS | Status: DC | PRN
  Administered 2019-04-06 (×4): 1.5 mg

## 2019-04-06 MED ORDER — IOHEXOL 300 MG/ML  SOLN
150.0000 mL | Freq: Once | INTRAMUSCULAR | Status: AC | PRN
  Administered 2019-04-06: 30 mL via INTRA_ARTERIAL

## 2019-04-06 MED ORDER — HEPARIN SODIUM (PORCINE) 1000 UNIT/ML IJ SOLN
INTRAMUSCULAR | Status: DC | PRN
  Administered 2019-04-06: 3000 [IU] via INTRAVENOUS
  Administered 2019-04-06: 1000 [IU] via INTRAVENOUS

## 2019-04-06 MED ORDER — ACETAMINOPHEN 650 MG RE SUPP: 650.0000 mg | RECTAL | Status: DC | PRN

## 2019-04-06 MED ORDER — PHENYLEPHRINE HCL-NACL 10-0.9 MG/250ML-% IV SOLN
INTRAVENOUS | Status: DC | PRN
  Administered 2019-04-06: 50 ug/min via INTRAVENOUS

## 2019-04-06 MED ORDER — FENTANYL CITRATE (PF) 250 MCG/5ML IJ SOLN
INTRAMUSCULAR | Status: DC | PRN
  Administered 2019-04-06 (×2): 50 ug via INTRAVENOUS

## 2019-04-06 MED ORDER — ONDANSETRON HCL 4 MG/2ML IJ SOLN
INTRAMUSCULAR | Status: DC | PRN
  Administered 2019-04-06: 4 mg via INTRAVENOUS

## 2019-04-06 MED ORDER — ONDANSETRON HCL 4 MG/2ML IJ SOLN: 4.0000 mg | Freq: Four times a day (QID) | INTRAMUSCULAR | Status: DC | PRN

## 2019-04-06 NOTE — Procedures (Signed)
S/P RT coomon carotid arteriogram followed by stent assisted angioplasty of  RT MCA M 1 seg with TICI 3 revascularization. S.Jaceion Aday MD

## 2019-04-06 NOTE — Progress Notes (Signed)
1630:  Right Groin site bleeding.  Manual pressure held.  Site stopped bleeding at 1645, new pressure dressing applied.  RN to continue to monitor.

## 2019-04-06 NOTE — Progress Notes (Signed)
Patient ID: Christy Carter, female   DOB: [birth year], 85 y.o.   MRN: [id number] INR. Patient denies any new neurological or systemic complaints.. Aware of the procedure,risks alternatives as per extensive discussions with patient ,daughter and son. See previous notes. On exan .  Alert awake ,Appropriately responsive. Mild Lt sided hemiparesis Arm > led. NO sig Lt facial droop. S.Gregoria Selvy MD

## 2019-04-06 NOTE — Progress Notes (Signed)
PT Cancellation Note  Patient Details Name: Christy Carter MRN: [id number] DOB: 1933-06-30   Cancelled Treatment:    Reason Eval/Treat Not Completed: (P) Medical issues which prohibited therapy;Patient at procedure or test/unavailable(Pt off unit for surgical procedure, will f/u per POC.)   [name] Eli Hose 04/06/2019, 11:26 AM

## 2019-04-06 NOTE — Progress Notes (Addendum)
CRITICAL VALUE ALERT  Critical Value:  APTT 163  Date & Time Notied:  04/06/2019 1325  Provider Notified: Dr. Estanislado Pandy  Orders Received/Actions taken: No new orders at this time.

## 2019-04-06 NOTE — Progress Notes (Signed)
PROGRESS NOTE  Christy Carter  DOB: 1933/07/02  PCP: Lavera Guise, MD [id number]  DOA: 03/29/2019  LOS: 8 days   Cc: Left-sided weakness for a week  Brief narrative: Christy Carter is a 85 y.o. female with PMH of hypertension, gout, hyperlipidemia, arthritis, hypothyroidism, recent ischemic stroke s/p and angioplasty who presented with complaint of increased confusion, unsteady gait and increase left-sided weakness since 1 week.   Stroke work-up was started at [facility].   MRI brain showed small acute lacunar infarct in the subcortical white matter of the right middle frontal gyrus.  MRI head and neck also showed recurrent critical stenosis/near occlusion of the right MCA M1 segment which was angioplastied in September 2020.   Admitting physician discussed with neurologist on-call Dr. Amie Portland who recommended transfer to [facility] for possible endovascular evaluation to assess if she would be a candidate for repeat angioplasty versus stent. Neurology and vascular surgery consult were obtained.  Subjective: Patient was seen and examined this afternoon in neuro ICU.  Patient underwent right common carotid arteriogram followed by stent assisted angioplasty today.  Lying on bed.  Not in distress.  Alert, awake, seems a little tired.  No new complaint.  She repeated to me today that she is always a little weak on the left side than right after previous stroke.  Assessment/Plan: Acute ischemic stroke: -Secondary to recurrent critical stenosis/near occlusion of the right MCA M1 segment. -MRI brain, MRA head ad neck findings as above. -Neurology consultation appreciated.  -Continue aspirin, atorvastatin & Brilinta -PT eval was obtained.  Recommend SNF placement.  Intracranial vascular stenosis -MRI head and neck showed recurrent critical stenosis/near occlusion of the right MCA M1 segment which was angioplastied in September 2020. -Seen by neuro intervention radiology Dr.  Estanislado Pandy.  Diagnostic CT angiogram on 11/19.  Severe preocclusive stenosis of the right MCA M1 segment was identified.   -11/25, patient underwent right common carotid arteriogram followed by stent assisted angioplasty today.    Hypertension -Home meds include clonidine weekly patch and oral Zebeta.  Blood pressure continued to rise despite resumption of both medicines both resumed.   -Added amlodipine 10 mg daily.  Blood pressure is better controlled now. Continue hydralazine IV as needed.   Hypothyroidism:  -Continue levothyroxine.  Mixed hyperlipidemia:  -Reviewed lipid panel from September 2020.   -Continue statin.  Gout: Stable we will continue allopurinol.  Depression: Cont. Prozac & Seroquel  GERD: cont. PPI.  Mobility: PT/OT consultation obtained. SNF recommended. DVT prophylaxis:  Lovenox subcu Code Status:   Code Status: Full Code  Family Communication:  Expected Discharge:  Planned for stenting of the right MCA on 11/25  Consultants:  Neurology, neuroradiology  Procedures:    Antimicrobials: Anti-infectives (From admission, onward)   Start     Dose/Rate Route Frequency Ordered Stop   04/05/19 1146  ceFAZolin (ANCEF) 2-4 GM/100ML-% IVPB    Note to Pharmacy: Nyoka Cowden   : cabinet override      04/05/19 1146 04/05/19 2359   04/05/19 0845  ceFAZolin (ANCEF) IVPB 2g/100 mL premix     2 g 200 mL/hr over 30 Minutes Intravenous To Radiology 04/05/19 0831 04/06/19 0845      Diet Order            Diet NPO time specified  Diet effective now              Infusions:  . sodium chloride 75 mL/hr at 04/06/19 1600  . clevidipine    .  clevidipine 4 mg/hr (04/06/19 1307)  . heparin    . heparin 500 Units/hr (04/06/19 1600)    Scheduled Meds: . allopurinol  100 mg Oral QHS  . amLODipine  10 mg Oral Daily  . [START ON 04/07/2019] aspirin  81 mg Oral Daily   Or  . [START ON 04/07/2019] aspirin  81 mg Per Tube Daily  . atorvastatin  40 mg Oral  q1800  . bisoprolol  2.5 mg Oral Daily  . Chlorhexidine Gluconate Cloth  6 each Topical Daily  . eptifibatide      . FLUoxetine  10 mg Oral Daily  . levothyroxine  150 mcg Oral Q0600  . mupirocin ointment  1 application Nasal BID  . nitroGLYCERIN      . pantoprazole  40 mg Oral Daily  . QUEtiapine  25 mg Oral QHS  . ticagrelor  90 mg Oral BID   Or  . ticagrelor  90 mg Per Tube BID    PRN meds: acetaminophen **OR** acetaminophen (TYLENOL) oral liquid 160 mg/5 mL **OR** acetaminophen, eptifibatide, hydrALAZINE, ondansetron (ZOFRAN) IV   Objective: Vitals:   04/06/19 1530 04/06/19 1600  BP: (!) 110/58 (!) 120/54  Pulse: 79 76  Resp: 15 17  Temp:    SpO2: 93% 95%    Intake/Output Summary (Last 24 hours) at 04/06/2019 1613 Last data filed at 04/06/2019 1600 Gross per 24 hour  Intake 1242.78 ml  Output 675 ml  Net 567.78 ml   Filed Weights   04/06/19 0756  Weight: 84.4 kg   Weight change:  Body mass index is 29.13 kg/m.   Physical Exam: General exam: Lying down in bed.  Not in physical distress Skin: No rashes, lesions or ulcers. HEENT: Atraumatic, normocephalic, supple neck, no obvious bleeding Lungs: Clear to auscultation bilaterally CVS: Regular rate and rhythm, no murmur GI/Abd soft, nontender, nondistended, bowel sound present CNS: Alert, awake, oriented x3 Psychiatry: Mood appropriate Extremities: No pedal edema, no calf tenderness.  Data Review: I have personally reviewed the laboratory data and studies available.  Recent Labs  Lab 03/31/19 1426 04/05/19 0307  WBC 8.3 6.0  NEUTROABS  --  3.4  HGB 13.0 12.6  HCT 42.1 39.2  MCV 97.5 92.5  PLT 262 276   Recent Labs  Lab 03/31/19 1426  NA 137  K 3.5  CL 106  CO2 18*  GLUCOSE 85  BUN 12  CREATININE 0.69  CALCIUM 8.9    Terrilee Croak, MD  [facility] 04/06/2019

## 2019-04-06 NOTE — Progress Notes (Signed)
Patient continues to be very restless and will not keep leg straight.  Neurologist paged and received verbal order.

## 2019-04-06 NOTE — Progress Notes (Signed)
STROKE TEAM PROGRESS NOTE   INTERVAL HISTORY Patient is seen in PACU.  She had successful right MCA angioplasty stenting by Dr. Estanislado Pandy.  She is drowsy but can be aroused.  She has some slurred speech and left hemiparesis.  She follows simple commands..  Blood pressure is adequate Postprocedure CT in IR shows slight right perisylvian hyperdensity unclear as to whether this is contrast pain versus hemorrhage Vitals:   04/06/19 1215 04/06/19 1230 04/06/19 1239 04/06/19 1245  BP: 125/61  (!) 120/53 (!) 140/50  Pulse: 83 81 75 80  Resp: 18 15 14 17   Temp:      TempSrc:      SpO2: 91% 91% (!) 88% 95%  Weight:      Height:        CBC:  Recent Labs  Lab 03/31/19 1426 04/05/19 0307  WBC 8.3 6.0  NEUTROABS  --  3.4  HGB 13.0 12.6  HCT 42.1 39.2  MCV 97.5 92.5  PLT 262 [id number]    Basic Metabolic Panel:  Recent Labs  Lab 03/31/19 1426  NA 137  K 3.5  CL 106  CO2 18*  GLUCOSE 85  BUN 12  CREATININE 0.69  CALCIUM 8.9   Lipid Panel:     Component Value Date/Time   CHOL 132 03/29/2019 0806   CHOL 98 09/07/2013 0358   TRIG 127 03/29/2019 0806   TRIG 63 09/07/2013 0358   HDL 41 03/29/2019 0806   HDL 43 09/07/2013 0358   CHOLHDL 3.2 03/29/2019 0806   VLDL 25 03/29/2019 0806   VLDL 13 09/07/2013 0358   LDLCALC 66 03/29/2019 0806   LDLCALC 42 09/07/2013 0358   HgbA1c:  Lab Results  Component Value Date   HGBA1C 5.5 03/29/2019   Urine Drug Screen: No results found for: LABOPIA, COCAINSCRNUR, LABBENZ, AMPHETMU, THCU, LABBARB  Alcohol Level No results found for: ETH  IMAGING past 24h Ct Head Wo Contrast  Result Date: 04/06/2019 CLINICAL DATA:  Severe headache. Altered mental status. Right MCA stent placed 04/06/2019 EXAM: CT HEAD WITHOUT CONTRAST TECHNIQUE: Contiguous axial images were obtained from the base of the skull through the vertex without intravenous contrast. COMPARISON:  CT head 03/28/2019 FINDINGS: Brain: Interval placement of right MCA stent in good position.  Small area of acute hemorrhage in the right basal ganglia measuring approximately 5 mm. Smaller additional areas of acute hemorrhage also in the right basal ganglia. No subarachnoid hemorrhage. Moderate ventricular enlargement with atrophy unchanged. Chronic ischemic changes in the white matter bilaterally. Vascular: Right MCA stent in good position. Negative for hyperdense vessel Skull: Negative Sinuses/Orbits: Negative Other: Motion degraded study IMPRESSION: Right MCA stent placed today. Small areas of acute hemorrhage in the right basal ganglia have developed since prior studies and appear acute. Atrophy and chronic ischemia.  No acute ischemic infarction. Electronically Signed   By: Franchot Gallo M.D.   On: 04/06/2019 12:18    PHYSICAL EXAM   General -obese elderly Caucasian lady in no apparent distress.  Ophthalmologic - fundi not visualized due to noncooperation.  Cardiovascular - Regular rhythm and rate.  Mental Status -  Drowsy but can be easily aroused.  Diminished attention, registration and recall.  Poor insight into her illness.  Speech is mildly dysarthric.  Follows only simple midline and one-step commands.  Cranial Nerves II - XII - II - Visual field intact OU. III, IV, VI - Extraocular movements intact. V - Facial sensation intact bilaterally. VII - Facial movement intact bilaterally. VIII - Hearing &  vestibular intact bilaterally. X - Palate elevates symmetrically. XI - Chin turning & shoulder shrug intact bilaterally. XII - Tongue protrusion intact.  Motor Strength - The patient's strength was normal in right upper and lower extremities, however, left UE and LE 3/5. Bulk was normal and fasciculations were absent.   Motor Tone - Muscle tone was assessed at the neck and appendages and was normal.  Reflexes - The patient's reflexes were symmetrical in all extremities    Sensory - Light touch, temperature/pinprick were assessed and were symmetrical.    Coordination - The  patient had normal movements in the right hand with no ataxia or dysmetria, left FTN mild dysmetria.  Tremor was absent.  Gait and Station - deferred.   ASSESSMENT/PLAN [name] is a 85 y.o. female with history of R MCA infarct s/p R M2 angioplasty in Sept 2020 presenting to [facility] 03/28/2019 with worsening L HP for the past 1 week. MRI w/ new R MCA infarct with recurrent critical stenosis/near occlusion R M1 on MRA. Transferred to [facility].   Stroke:   Small subcortical white matter R frontal lobe infarct in setting of recurrent critical R M1 stenosis/near occlusion   CT head no acute abnormality. Small vessel disease. Atrophy.   CT CS no traumatic finding  MRI small subcortical white matter R frontal lobe infarct. expected evolution R corona radiata infarct. Abnormal flow distal R VA.   MRA Head  Recurrent critical stenosis/near occlusion R M1 s/p angioplasty in 01/2019 w/ reconstituted R MCA. Othewise stable chronic L A1 occlusion, poor flow distal R VA, severe R PCA stenosis.   MRA  Neck stable chronic R VA occlusion   Cerebral angio ([name]) severe preocclusive stenosis of right M1  Plan for right M1 stent next Tuesday     LDL 66  HgbA1c 5.5  P2Y12 68  Lovenox 40 mg sq daily for VTE prophylaxis  aspirin 81 mg daily and Brilinta (ticagrelor) 90 mg bid prior to admission, now on aspirin 81 mg daily and Brilinta (ticagrelor) 90 mg bid.   Therapy recommendations:  SNF   Disposition:  pending (has bed offers)  History of Stroke/TIA  01/2019 - R watershed territory infarct in setting of severe stenosis right M1 segment s/p IR w/ partial revascularization AND possible small left lateral posterior temporal lobe infarct AND R middle cerebellar peduncle in setting of diffuse intracranial stenosis as likely etiology. EF 65-70%, LDL 139 and A1C 5.6. on ASA and brilinta. D/c to CIR.   08/2013 - TIA, vertigo after toileting  during the night w/ AP, pressure and L sided numbness, at [facility] [place])  [phone] - stroke, residual LUE HP per pt. details not available  Baseline dementia  Agitation and sundowning in Sept led to tx w/ Seroquel  Agitated this am, on b/l hand mittens  On seroquel Qhs  Hypertension  Stable . Permissive hypertension (OK if < 220/120) but gradually normalize in 5-7 days . Long-term BP goal 130-150 given right M1 high grade stenosis  Hyperlipidemia  Home meds:  lipitor 40, resumed in hospital  LDL 66, goal < 70  Continue statin at discharge  Other Stroke Risk Factors  Advanced age  Former Cigarette smoker  Obesity, BMI 29.13, recommend weight loss, diet and exercise as appropriate   Mild CAD  Obstructive sleep apnea  Other Active Problems  Hypothyroidism. TSH 17.492  Gout on allopurinol   Depression on prozac  GERD on PPI  Hospital day # 8  Patient had elective right MCA angioplasty and has obtain slight worsening of left hemiparesis and dysarthria.  Unclear if this is anesthesia effect.  Recommend continue conservative follow-up and maintain systolic blood pressure 0000000 range.  May need to consider repeating CT scan in a few hours if not improved..  Discussed with Dr. Estanislado Pandy, Dr. Pietro Cassis  and answered questions..  Continue ongoing management.  Greater than 50% time during this 35-minute visit was spent on discussion about carotid angioplasty and stenting discussion with care team. Antony Contras, MD Medical Director [place] Pager: [phone] 04/06/2019 12:58 PM   To contact Stroke Continuity provider, please refer to [email or web address] After hours, contact General Neurology

## 2019-04-06 NOTE — Progress Notes (Signed)
Patient ID: NALINA WORK, female   DOB: June 17, 1933, 85 y.o.   MRN: [id number] CT brain  report  noted. 2 mm focus of hyperdensity in the  ant perisylvian region   Hemorrhage v contrast stain. S.Geremiah Fussell MD

## 2019-04-06 NOTE — Progress Notes (Signed)
Patient taken to IR this AM.

## 2019-04-06 NOTE — Anesthesia Postprocedure Evaluation (Signed)
Anesthesia Post Note  Patient: Christy Carter  Procedure(s) Performed: STENTING (N/A )     Patient location during evaluation: PACU Anesthesia Type: General Level of consciousness: awake and alert Pain management: pain level controlled Vital Signs Assessment: post-procedure vital signs reviewed and stable Respiratory status: spontaneous breathing, nonlabored ventilation, respiratory function stable and patient connected to nasal cannula oxygen Cardiovascular status: blood pressure returned to baseline and stable Postop Assessment: no apparent nausea or vomiting Anesthetic complications: no    Last Vitals:  Vitals:   04/06/19 1400 04/06/19 1415  BP:    Pulse: 82 80  Resp: 19 (!) 31  Temp:    SpO2: 93% 95%    Last Pain:  Vitals:   04/06/19 0721  TempSrc: Oral  PainSc:                  Tiajuana Amass

## 2019-04-06 NOTE — Progress Notes (Signed)
ANTICOAGULATION CONSULT NOTE - Initial Consult  Pharmacy Consult for heparin Indication: post MCA stenting  No Known Allergies  Patient Measurements: Height: 5\' 7"  (170.2 cm) Weight: 186 lb (84.4 kg) IBW/kg (Calculated) : 61.6 Heparin Dosing Weight: 79kg  Vital Signs: Temp: 98.3 F (36.8 C) (11/25 2007) Temp Source: Axillary (11/25 2007) BP: 134/57 (11/25 2100) Pulse Rate: 89 (11/25 2100)  Labs: Recent Labs    04/05/19 0307 04/06/19 1401 04/06/19 2050  HGB 12.6  --   --   HCT 39.2  --   --   PLT 276  --   --   APTT 32 163*  --   HEPARINUNFRC  --   --  0.19*    Estimated Creatinine Clearance: 59.5 mL/min (by C-G formula based on SCr of 0.69 mg/dL).   Medical History: Past Medical History:  Diagnosis Date  . Arthritis   . Carpal tunnel syndrome   . Complication of anesthesia    Difficulty waking up. Believes it took 2 days to wake up after her surgery.  . Depression   . Hyperlipidemia   . Hypertension   . Shortness of breath   . Stroke ([place])   . Thyroid disease    Assessment: 89 yof s/p MCA stenting in IR this afternoon. Heparin level within goal range at 0.19. No bleeding issues noted. Heparin off in am.   Goal of Therapy:  Heparin level goal 0.1-0.25 Monitor platelets by anticoagulation protocol: Yes   Plan:  Continue heparin at 500 units/hr Heparin off at [place] PharmD., BCPS Clinical Pharmacist 04/06/2019 9:44 PM

## 2019-04-06 NOTE — Transfer of Care (Signed)
Immediate Anesthesia Transfer of Care Note  Patient: Christy Carter  Procedure(s) Performed: STENTING (N/A )  Patient Location: PACU  Anesthesia Type:General  Level of Consciousness: drowsy and patient cooperative  Airway & Oxygen Therapy: Patient Spontanous Breathing  Post-op Assessment: Report given to RN and Post -op Vital signs reviewed and stable  Post vital signs: Reviewed and stable  Last Vitals:  Vitals Value Taken Time  BP 122/54 04/06/19 1200  Temp 36.7 C 04/06/19 1200  Pulse 62 04/06/19 1204  Resp 18 04/06/19 1207  SpO2 96 % 04/06/19 1204  Vitals shown include unvalidated device data.  Last Pain:  Vitals:   04/06/19 0721  TempSrc: Oral  PainSc:          Complications: No apparent anesthesia complications

## 2019-04-06 NOTE — Progress Notes (Signed)
OR nurse requested that pt get's Zebeta before surgery. Medication not available on the unit, Pharmacy notified.

## 2019-04-06 NOTE — Anesthesia Procedure Notes (Signed)
Procedure Name: Intubation Date/Time: 04/06/2019 8:57 AM Performed by: Lance Coon, CRNA Pre-anesthesia Checklist: Patient identified, Emergency Drugs available, Suction available, Patient being monitored and Timeout performed Patient Re-evaluated:Patient Re-evaluated prior to induction Oxygen Delivery Method: Circle system utilized Preoxygenation: Pre-oxygenation with 100% oxygen Induction Type: IV induction Ventilation: Mask ventilation without difficulty Laryngoscope Size: Miller and 2 Grade View: Grade I Tube type: Oral Tube size: 7.0 mm Number of attempts: 1 Airway Equipment and Method: Stylet Placement Confirmation: ETT inserted through vocal cords under direct vision,  positive ETCO2 and breath sounds checked- equal and bilateral Secured at: 21 cm Tube secured with: Tape Dental Injury: Teeth and Oropharynx as per pre-operative assessment

## 2019-04-06 NOTE — Progress Notes (Signed)
Bedside report given to Dewitt Hoes, RN. Right groin checked, dressing c/d/i, level 0.

## 2019-04-06 NOTE — Progress Notes (Signed)
8 fr angio seal placed into right groib by Dr. Estanislado Pandy. IR tech goin pressure to right groin at this time.

## 2019-04-06 NOTE — Progress Notes (Signed)
SLP Cancellation Note  Patient Details Name: Christy Carter MRN: [id number] DOB: [birth year]   Cancelled treatment:       Reason Eval/Treat Not Completed: Patient at procedure or test/unavailable.  SLP will f/u as schedule allows.    Elvia Collum Sofiah Lyne 04/06/2019, 8:09 AM

## 2019-04-06 NOTE — Progress Notes (Signed)
Patient arrived to unit at 1340.  Noted that right groin site bleeding and patient not compliant with keeping leg straight.  Manual pressure held for 20 minutes.  New dressing applied and educated patient on importance of keeping leg straight.  Dr. Estanislado Pandy paged and notified.  New orders received.  RN to continue to monitor.

## 2019-04-06 NOTE — Progress Notes (Addendum)
Patient ID: Christy Carter, female   DOB: 10/23/1933, 85 y.o.   MRN: [id number] INR . Post procedure . Extubated .  C? O mod headache and nausea. Obeys simple commands appropriately. Able to bend LT knee and lift Lt arm off the bed. RT groin soft. Distal pulses dopplerable DP and PT bilaterally.  Stat CT brain post procedure. S.Jacquette Canales MD  CT brain No ICH seen. Clinically Lt facial droop and LT sided weaknes arm> leg. S.Caylea Foronda MD

## 2019-04-06 NOTE — Progress Notes (Signed)
Referring Physician(s): Dr Royal Hawthorn  Supervising Physician: [name]  Patient Status:  [facility] - In-pt  Chief Complaint:  CVA  Subjective:  RT common carotid arteriogram followed by RT common carotid arteriogram followed by stent assisted angioplasty of  RT MCA M 1 seg with TICI 3 revascularization..  Doing well Moving all 4s to command Does complain of back pain from lying so still  Allergies: Patient has no known allergies.  Medications: Prior to Admission medications   Medication Sig Start Date End Date Taking? Authorizing Provider  acetaminophen (TYLENOL) 325 MG tablet Take 2 tablets (650 mg total) by mouth every 4 (four) hours as needed for mild pain (or temp > 37.5 C (99.5 F)). 03/08/19  Yes Angiulli, Lavon Paganini, PA-C  allopurinol (ZYLOPRIM) 100 MG tablet Take 2 tabs at bed time for gout 01/12/19  Yes Ronnell Freshwater, NP  aspirin EC 81 MG EC tablet Take 1 tablet (81 mg total) by mouth daily. 02/11/19  Yes Donzetta Starch, NP  atorvastatin (LIPITOR) 40 MG tablet Take 1 tablet (40 mg total) by mouth daily at 6 PM. 03/08/19  Yes Angiulli, Lavon Paganini, PA-C  bisoprolol (ZEBETA) 5 MG tablet Take 0.5 tablets (2.5 mg total) by mouth daily. 03/08/19  Yes Angiulli, Lavon Paganini, PA-C  cloNIDine (CATAPRES - DOSED IN MG/24 HR) 0.1 mg/24hr patch Place 1 patch (0.1 mg total) onto the skin every Monday. 03/14/19  Yes Angiulli, Lavon Paganini, PA-C  colchicine 0.6 MG tablet Take 1 tablet (0.6 mg total) by mouth 2 (two) times daily. 12/06/18  Yes Boscia, Heather E, NP  Cyanocobalamin (B-12) 2500 MCG TABS Take 2,500 mcg by mouth daily as needed (For Lethargy).    Yes [provider]  diclofenac sodium (VOLTAREN) 1 % GEL Apply 4 g topically 4 (four) times daily. Patient taking differently: Apply 4 g topically 4 (four) times daily as needed (For pain).  03/08/19  Yes Angiulli, Lavon Paganini, PA-C  FLUoxetine (PROZAC) 10 MG capsule Take 1 capsule (10 mg total) by mouth daily. 03/08/19  Yes Angiulli,  Lavon Paganini, PA-C  levothyroxine (SYNTHROID) 150 MCG tablet Take 1 tablet (150 mcg total) by mouth daily before breakfast. 03/08/19  Yes Angiulli, Lavon Paganini, PA-C  omeprazole (PRILOSEC) 40 MG capsule Take 1 capsule (40 mg total) by mouth daily. 03/08/19  Yes Angiulli, Lavon Paganini, PA-C  QUEtiapine (SEROQUEL) 25 MG tablet Take 1 tablet (25 mg total) by mouth at bedtime. 03/08/19  Yes Angiulli, Lavon Paganini, PA-C  ticagrelor (BRILINTA) 90 MG TABS tablet Take 1 tablet (90 mg total) by mouth 2 (two) times daily. 03/08/19  Yes Angiulli, Lavon Paganini, PA-C  VENTOLIN HFA 108 (90 Base) MCG/ACT inhaler TAKE 2 PUFFS BY MOUTH EVERY 6 HOURS AS NEEDED FOR WHEEZE OR SHORTNESS OF BREATH Patient taking differently: Inhale 2 puffs into the lungs every 6 (six) hours as needed for shortness of breath.  03/19/18  Yes Boscia, Greer Ee, NP  Vitamin D, Ergocalciferol, (DRISDOL) 1.25 MG (50000 UT) CAPS capsule Take 1 capsule (50,000 Units total) by mouth every 7 (seven) days. 02/02/19  Yes Kendell Bane, NP     Vital Signs: BP 114/78   Pulse 80   Temp 97.6 F (36.4 C)   Resp (!) 31   Ht 5\' 7"  (1.702 m)   Wt 186 lb (84.4 kg)   SpO2 95%   BMI 29.13 kg/m   Physical Exam Vitals signs reviewed.  HENT:     Mouth/Throat:  Mouth: Mucous membranes are moist.     Comments: Tongue midline Shows teeth Neck:     Musculoskeletal: Normal range of motion.  Musculoskeletal: Normal range of motion.     Comments: Moving all 4s to command Rt grain NT no bleeding No hematoma  Strength = B   Skin:    General: Skin is warm and dry.  Neurological:     Mental Status: She is alert and oriented to person, place, and time.     Imaging: Ct Head Wo Contrast  Result Date: 04/06/2019 CLINICAL DATA:  Severe headache. Altered mental status. Right MCA stent placed 04/06/2019 EXAM: CT HEAD WITHOUT CONTRAST TECHNIQUE: Contiguous axial images were obtained from the base of the skull through the vertex without intravenous contrast.  COMPARISON:  CT head 03/28/2019 FINDINGS: Brain: Interval placement of right MCA stent in good position. Small area of acute hemorrhage in the right basal ganglia measuring approximately 5 mm. Smaller additional areas of acute hemorrhage also in the right basal ganglia. No subarachnoid hemorrhage. Moderate ventricular enlargement with atrophy unchanged. Chronic ischemic changes in the white matter bilaterally. Vascular: Right MCA stent in good position. Negative for hyperdense vessel Skull: Negative Sinuses/Orbits: Negative Other: Motion degraded study IMPRESSION: Right MCA stent placed today. Small areas of acute hemorrhage in the right basal ganglia have developed since prior studies and appear acute. Atrophy and chronic ischemia.  No acute ischemic infarction. Electronically Signed   By: Franchot Gallo M.D.   On: 04/06/2019 12:18    Labs:  CBC: Recent Labs    03/29/19 0806 03/29/19 1813 03/31/19 1426 04/05/19 0307  WBC 7.5 8.1 8.3 6.0  HGB 11.7* 13.0 13.0 12.6  HCT 37.2 39.4 42.1 39.2  PLT 225 227 262 276    COAGS: Recent Labs    03/28/19 2114 04/05/19 0307  INR 1.0  --   APTT 27 32    BMP: Recent Labs    03/07/19 1259 03/28/19 1457 03/29/19 0806 03/29/19 1813 03/31/19 1426  NA 140 140 139  --  137  K 4.3 4.2 3.8  --  3.5  CL 104 103 105  --  106  CO2 26 25 26   --  18*  GLUCOSE 92 102* 104*  --  85  BUN 17 21 17   --  12  CALCIUM 9.3 9.6 9.1  --  8.9  CREATININE 1.23* 0.86 0.71 0.77 0.69  GFRNONAA 41* >60 >60 >60 >60  GFRAA 47* >60 >60 >60 >60    LIVER FUNCTION TESTS: Recent Labs    11/25/18 1046 02/14/19 0748 03/28/19 1457 03/29/19 0806  BILITOT 0.2 0.9 1.0 0.8  AST 14 23 21  12*  ALT 12 20 10 8   ALKPHOS 90 69 97 79  PROT 6.5 6.8 7.2 6.4*  ALBUMIN 3.9 3.3* 3.7 3.3*    Assessment and Plan:  Stent assisted angioplasty of  RT MCA M 1 seg with TICI 3 revascularization. Doing well Dr Estanislado Pandy has seen and evaluated pt Will follow  Plan per Stroke team    Electronically Signed: Lavonia Drafts, PA-C 04/06/2019, 2:28 PM   I spent a total of 15 Minutes at the the patient's bedside AND on the patient's hospital floor or unit, greater than 50% of which was counseling/coordinating care for Rt MCA revascularization

## 2019-04-07 LAB — BASIC METABOLIC PANEL
Anion gap: 11 (ref 5–15)
BUN: 20 mg/dL (ref 8–23)
CO2: 20 mmol/L — ABNORMAL LOW (ref 22–32)
Calcium: 8.2 mg/dL — ABNORMAL LOW (ref 8.9–10.3)
Chloride: 109 mmol/L (ref 98–111)
Creatinine, Ser: 0.9 mg/dL (ref 0.44–1.00)
GFR calc Af Amer: >60 mL/min (ref >60)
GFR calc non Af Amer: 59 mL/min — ABNORMAL LOW (ref >60)
Glucose, Bld: 117 mg/dL — ABNORMAL HIGH (ref 70–99)
Potassium: 3.7 mmol/L (ref 3.5–5.1)
Sodium: 140 mmol/L (ref 135–145)

## 2019-04-07 LAB — CBC WITH DIFFERENTIAL/PLATELET
Abs Immature Granulocytes: 0.07 10*3/uL (ref 0.00–0.07)
Basophils Absolute: 0 10*3/uL (ref 0.0–0.1)
Basophils Relative: 0 %
Eosinophils Absolute: 0 10*3/uL (ref 0.0–0.5)
Eosinophils Relative: 0 %
HCT: 35 % — ABNORMAL LOW (ref 36.0–46.0)
Hemoglobin: 11 g/dL — ABNORMAL LOW (ref 12.0–15.0)
Immature Granulocytes: 1 %
Lymphocytes Relative: 11 %
Lymphs Abs: 0.9 10*3/uL (ref 0.7–4.0)
MCH: 30.1 pg (ref 26.0–34.0)
MCHC: 31.4 g/dL (ref 30.0–36.0)
MCV: 95.6 fL (ref 80.0–100.0)
Monocytes Absolute: 0.6 10*3/uL (ref 0.1–1.0)
Monocytes Relative: 7 %
Neutro Abs: 7.1 10*3/uL (ref 1.7–7.7)
Neutrophils Relative %: 81 %
Platelets: 293 10*3/uL (ref 150–400)
RBC: 3.66 MIL/uL — ABNORMAL LOW (ref 3.87–5.11)
RDW: 15.1 % (ref 11.5–15.5)
WBC: 8.8 10*3/uL (ref 4.0–10.5)
nRBC: 0 % (ref 0.0–0.2)

## 2019-04-07 NOTE — Progress Notes (Signed)
STROKE TEAM PROGRESS NOTE   INTERVAL HISTORY Patient is patient is awake and more interactive today.  She is lying in bed and is uncomfortable because of right groin dressing and the leg being strapped.  She did have some oozing from the groin yesterday which required pressure to stop.  She is still on IV heparin overnight.  Blood pressure adequately controlled.  CT scan of the head done yesterday showed tiny punctate right basal ganglia hemorrhage but was otherwise unchanged Vitals:   04/07/19 1000 04/07/19 1015 04/07/19 1030 04/07/19 1045  BP: (!) 150/43 125/81 (!) 129/46 (!) 123/53  Pulse: 91 94 88 87  Resp: 16 (!) 32 (!) 25 16  Temp:      TempSrc:      SpO2: 98% 93% 92% 94%  Weight:      Height:        CBC:  Recent Labs  Lab 04/05/19 0307 04/07/19 0544  WBC 6.0 8.8  NEUTROABS 3.4 7.1  HGB 12.6 11.0*  HCT 39.2 35.0*  MCV 92.5 95.6  PLT 276 0000000    Basic Metabolic Panel:  Recent Labs  Lab 03/31/19 1426 04/07/19 0544  NA 137 140  K 3.5 3.7  CL 106 109  CO2 18* 20*  GLUCOSE 85 117*  BUN 12 20  CREATININE 0.69 0.90  CALCIUM 8.9 8.2*   Lipid Panel:     Component Value Date/Time   CHOL 132 03/29/2019 0806   CHOL 98 09/07/2013 0358   TRIG 127 03/29/2019 0806   TRIG 63 09/07/2013 0358   HDL 41 03/29/2019 0806   HDL 43 09/07/2013 0358   CHOLHDL 3.2 03/29/2019 0806   VLDL 25 03/29/2019 0806   VLDL 13 09/07/2013 0358   LDLCALC 66 03/29/2019 0806   LDLCALC 42 09/07/2013 0358   HgbA1c:  Lab Results  Component Value Date   HGBA1C 5.5 03/29/2019   Urine Drug Screen: No results found for: LABOPIA, COCAINSCRNUR, LABBENZ, AMPHETMU, THCU, LABBARB  Alcohol Level No results found for: ETH  IMAGING past 24h Ct Head Wo Contrast  Result Date: 04/06/2019 CLINICAL DATA:  Severe headache. Altered mental status. Right MCA stent placed 04/06/2019 EXAM: CT HEAD WITHOUT CONTRAST TECHNIQUE: Contiguous axial images were obtained from the base of the skull through the vertex  without intravenous contrast. COMPARISON:  CT head 03/28/2019 FINDINGS: Brain: Interval placement of right MCA stent in good position. Small area of acute hemorrhage in the right basal ganglia measuring approximately 5 mm. Smaller additional areas of acute hemorrhage also in the right basal ganglia. No subarachnoid hemorrhage. Moderate ventricular enlargement with atrophy unchanged. Chronic ischemic changes in the white matter bilaterally. Vascular: Right MCA stent in good position. Negative for hyperdense vessel Skull: Negative Sinuses/Orbits: Negative Other: Motion degraded study IMPRESSION: Right MCA stent placed today. Small areas of acute hemorrhage in the right basal ganglia have developed since prior studies and appear acute. Atrophy and chronic ischemia.  No acute ischemic infarction. Electronically Signed   By: Franchot Gallo M.D.   On: 04/06/2019 12:18    PHYSICAL EXAM   General -obese elderly Caucasian lady in no apparent distress.  Ophthalmologic - fundi not visualized due to noncooperation.  Cardiovascular - Regular rhythm and rate.  Mental Status -  She is awake alert and interactive.  Diminished attention, registration and recall.    Speech is clear follows only simple midline and one-step commands.  Cranial Nerves II - XII - II - Visual field intact OU. III, IV, VI - Extraocular  movements intact. V - Facial sensation intact bilaterally. VII - Facial movement intact bilaterally. VIII - Hearing & vestibular intact bilaterally. X - Palate elevates symmetrically. XI - Chin turning & shoulder shrug intact bilaterally. XII - Tongue protrusion intact.  Motor Strength - The patient's strength was normal in right upper and lower extremities, however, left UE and LE 4/5. Bulk was normal and fasciculations were absent.   Motor Tone - Muscle tone was assessed at the neck and appendages and was normal.  Reflexes - The patient's reflexes were symmetrical in all extremities    Sensory -  Light touch, temperature/pinprick were assessed and were symmetrical.    Coordination - The patient had normal movements in the right hand with no ataxia or dysmetria, left FTN mild dysmetria.  Tremor was absent.  Gait and Station - deferred.   ASSESSMENT/PLAN Christy Carter is a 85 y.o. female with history of R MCA infarct s/p R M2 angioplasty in Sept 2020 presenting to [facility] 03/28/2019 with worsening L HP for the past 1 week. MRI w/ new R MCA infarct with recurrent critical stenosis/near occlusion R M1 on MRA. Transferred to [facility]. [facility].   Stroke:   Small subcortical white matter R frontal lobe infarct in setting of recurrent critical R M1 stenosis/near occlusion   CT head no acute abnormality. Small vessel disease. Atrophy.   CT CS no traumatic finding  MRI small subcortical white matter R frontal lobe infarct. expected evolution R corona radiata infarct. Abnormal flow distal R VA.   MRA Head  Recurrent critical stenosis/near occlusion R M1 s/p angioplasty in 01/2019 w/ reconstituted R MCA. Othewise stable chronic L A1 occlusion, poor flow distal R VA, severe R PCA stenosis.   MRA  Neck stable chronic R VA occlusion   Cerebral angio ([name]) severe preocclusive stenosis of right M1  Now s/p right MCA angioplasty stent 04/06/2019  LDL 66  HgbA1c 5.5  P2Y12 68  Lovenox 40 mg sq daily for VTE prophylaxis  aspirin 81 mg daily and Brilinta (ticagrelor) 90 mg bid prior to admission, now on aspirin 81 mg daily and Brilinta (ticagrelor) 90 mg bid.   Therapy recommendations:  SNF   Disposition:  pending (has bed offers)  History of Stroke/TIA  01/2019 - R watershed territory infarct in setting of severe stenosis right M1 segment s/p IR w/ partial revascularization AND possible small left lateral posterior temporal lobe infarct AND R middle cerebellar peduncle in setting of diffuse intracranial stenosis as likely etiology. EF  65-70%, LDL 139 and A1C 5.6. on ASA and brilinta. D/c to CIR.   08/2013 - TIA, vertigo after toileting during the night w/ AP, pressure and L sided numbness, at [facility])  [phone] - stroke, residual LUE HP per pt. details not available  Baseline dementia  Agitation and sundowning in Sept led to tx w/ Seroquel  Agitated this am, on b/l hand mittens  On seroquel Qhs  Hypertension  Stable . Permissive hypertension (OK if < 220/120) but gradually normalize in 5-7 days . Long-term BP goal 130-150 given right M1 high grade stenosis  Hyperlipidemia  Home meds:  lipitor 40, resumed in hospital  LDL 66, goal < 70  Continue statin at discharge  Other Stroke Risk Factors  Advanced age  Former Cigarette smoker  Obesity, BMI 29.13, recommend weight loss, diet and exercise as appropriate   Mild CAD  Obstructive sleep apnea  Other Active Problems  Hypothyroidism.  TSH 17.492  Gout on allopurinol   Depression on prozac  GERD on PPI  Hospital day # 9  Patient is doing better today.  Recommend discontinuing leg restraint and mobilize out of bed.  Discontinue IV heparin drip.  Keep blood pressure goal to systolic below [id number] due to tiny basal ganglia hemorrhage and close neurological monitoring..  Discussed with Dr. Pietro Cassis  and answered questions..  Continue ongoing management. This patient is critically ill and at significant risk of neurological worsening, death and care requires constant monitoring of vital signs, hemodynamics,respiratory and cardiac monitoring, extensive review of multiple databases, frequent neurological assessment, discussion with family, other specialists and medical decision making of high complexity.I have made any additions or clarifications directly to the above note.This critical care time does not reflect procedure time, or teaching time or supervisory time of PA/NP/Med Resident etc but could involve care discussion time.  I  spent 30 minutes of neurocritical care time  in the care of  this patient.    Antony Contras, MD Medical Director [facility] Pager: [phone] 04/07/2019 11:05 AM   To contact Stroke Continuity provider, please refer to [email or web address] After hours, contact General Neurology

## 2019-04-07 NOTE — Progress Notes (Signed)
Referring Physician(s): Sethi,P  Supervising Physician: [name]  Patient Status:  [facility] - In-pt  Chief Complaint: CVA   Subjective: Patient awake, alert, answering questions appropriately, moving all fours okay.  Still a little weak with the left hand.  Only complaint is dry mouth.  Denies headache or worsening weakness or numbness of extremities.   Allergies: Patient has no known allergies.  Medications: Prior to Admission medications   Medication Sig Start Date End Date Taking? Authorizing Provider  acetaminophen (TYLENOL) 325 MG tablet Take 2 tablets (650 mg total) by mouth every 4 (four) hours as needed for mild pain (or temp > 37.5 C (99.5 F)). 03/08/19  Yes Angiulli, Lavon Paganini, PA-C  allopurinol (ZYLOPRIM) 100 MG tablet Take 2 tabs at bed time for gout 01/12/19  Yes Ronnell Freshwater, NP  aspirin EC 81 MG EC tablet Take 1 tablet (81 mg total) by mouth daily. 02/11/19  Yes Donzetta Starch, NP  atorvastatin (LIPITOR) 40 MG tablet Take 1 tablet (40 mg total) by mouth daily at 6 PM. 03/08/19  Yes Angiulli, Lavon Paganini, PA-C  bisoprolol (ZEBETA) 5 MG tablet Take 0.5 tablets (2.5 mg total) by mouth daily. 03/08/19  Yes Angiulli, Lavon Paganini, PA-C  cloNIDine (CATAPRES - DOSED IN MG/24 HR) 0.1 mg/24hr patch Place 1 patch (0.1 mg total) onto the skin every Monday. 03/14/19  Yes Angiulli, Lavon Paganini, PA-C  colchicine 0.6 MG tablet Take 1 tablet (0.6 mg total) by mouth 2 (two) times daily. 12/06/18  Yes Boscia, Heather E, NP  Cyanocobalamin (B-12) 2500 MCG TABS Take 2,500 mcg by mouth daily as needed (For Lethargy).    Yes [provider]  diclofenac sodium (VOLTAREN) 1 % GEL Apply 4 g topically 4 (four) times daily. Patient taking differently: Apply 4 g topically 4 (four) times daily as needed (For pain).  03/08/19  Yes Angiulli, Lavon Paganini, PA-C  FLUoxetine (PROZAC) 10 MG capsule Take 1 capsule (10 mg total) by mouth daily. 03/08/19  Yes Angiulli, Lavon Paganini, PA-C  levothyroxine  (SYNTHROID) 150 MCG tablet Take 1 tablet (150 mcg total) by mouth daily before breakfast. 03/08/19  Yes Angiulli, Lavon Paganini, PA-C  omeprazole (PRILOSEC) 40 MG capsule Take 1 capsule (40 mg total) by mouth daily. 03/08/19  Yes Angiulli, Lavon Paganini, PA-C  QUEtiapine (SEROQUEL) 25 MG tablet Take 1 tablet (25 mg total) by mouth at bedtime. 03/08/19  Yes Angiulli, Lavon Paganini, PA-C  ticagrelor (BRILINTA) 90 MG TABS tablet Take 1 tablet (90 mg total) by mouth 2 (two) times daily. 03/08/19  Yes Angiulli, Lavon Paganini, PA-C  VENTOLIN HFA 108 (90 Base) MCG/ACT inhaler TAKE 2 PUFFS BY MOUTH EVERY 6 HOURS AS NEEDED FOR WHEEZE OR SHORTNESS OF BREATH Patient taking differently: Inhale 2 puffs into the lungs every 6 (six) hours as needed for shortness of breath.  03/19/18  Yes Boscia, Greer Ee, NP  Vitamin D, Ergocalciferol, (DRISDOL) 1.25 MG (50000 UT) CAPS capsule Take 1 capsule (50,000 Units total) by mouth every 7 (seven) days. 02/02/19  Yes Scarboro, Audie Clear, NP     Vital Signs: BP (!) 142/62 Comment: clev titrated up  Pulse 82   Temp 97.8 F (36.6 C) (Oral)   Resp 15   Ht 5\' 7"  (1.702 m)   Wt 186 lb (84.4 kg)   SpO2 100%   BMI 29.13 kg/m   Physical Exam awake, following commands.  Some slight left facial asymmetry.  Slight weakness left hand grip; pupils equal round /reactive ;  puncture site right groin clean and dry, no distinct hematoma.  Imaging: Ct Head Wo Contrast  Result Date: 04/06/2019 CLINICAL DATA:  Severe headache. Altered mental status. Right MCA stent placed 04/06/2019 EXAM: CT HEAD WITHOUT CONTRAST TECHNIQUE: Contiguous axial images were obtained from the base of the skull through the vertex without intravenous contrast. COMPARISON:  CT head 03/28/2019 FINDINGS: Brain: Interval placement of right MCA stent in good position. Small area of acute hemorrhage in the right basal ganglia measuring approximately 5 mm. Smaller additional areas of acute hemorrhage also in the right basal ganglia. No  subarachnoid hemorrhage. Moderate ventricular enlargement with atrophy unchanged. Chronic ischemic changes in the white matter bilaterally. Vascular: Right MCA stent in good position. Negative for hyperdense vessel Skull: Negative Sinuses/Orbits: Negative Other: Motion degraded study IMPRESSION: Right MCA stent placed today. Small areas of acute hemorrhage in the right basal ganglia have developed since prior studies and appear acute. Atrophy and chronic ischemia.  No acute ischemic infarction. Electronically Signed   By: Franchot Gallo M.D.   On: 04/06/2019 12:18    Labs:  CBC: Recent Labs    03/29/19 1813 03/31/19 1426 04/05/19 0307 04/07/19 0544  WBC 8.1 8.3 6.0 8.8  HGB 13.0 13.0 12.6 11.0*  HCT 39.4 42.1 39.2 35.0*  PLT 227 262 276 293    COAGS: Recent Labs    03/28/19 2114 04/05/19 0307 04/06/19 1401  INR 1.0  --   --   APTT 27 32 163*    BMP: Recent Labs    03/28/19 1457 03/29/19 0806 03/29/19 1813 03/31/19 1426 04/07/19 0544  NA 140 139  --  137 140  K 4.2 3.8  --  3.5 3.7  CL 103 105  --  106 109  CO2 25 26  --  18* 20*  GLUCOSE 102* 104*  --  85 117*  BUN 21 17  --  12 20  CALCIUM 9.6 9.1  --  8.9 8.2*  CREATININE 0.86 0.71 0.77 0.69 0.90  GFRNONAA >60 >60 >60 >60 59*  GFRAA >60 >60 >60 >60 >60    LIVER FUNCTION TESTS: Recent Labs    11/25/18 1046 02/14/19 0748 03/28/19 1457 03/29/19 0806  BILITOT 0.2 0.9 1.0 0.8  AST 14 23 21  12*  ALT 12 20 10 8   ALKPHOS 90 69 97 79  PROT 6.5 6.8 7.2 6.4*  ALBUMIN 3.9 3.3* 3.7 3.3*    Assessment and Plan: Status post right common carotid arteriogram with stent assisted angioplasty of right MCA M1 segment with TICI 3 revascularization on 11/25; CT head on 11/25 showed small areas of acute hemorrhage in the right basal ganglia, atrophic and chronic ischemia no acute ischemic infarction; afebrile, creatinine normal, WBC normal, hemoglobin 11 ; on aspirin and Brilinta; further plans as per neurology.     Electronically Signed: D. Rowe Robert, PA-C 04/07/2019, 10:22 AM   I spent a total of 15 minutes at the the patient's bedside AND on the patient's hospital floor or unit, greater than 50% of which was counseling/coordinating care for cerebral arteriogram with endovascular intervention    Patient ID: Christy Carter, female   DOB: 08/01/1933, 85 y.o.   MRN: [id number]

## 2019-04-07 NOTE — Progress Notes (Signed)
PROGRESS NOTE  RABECKA SHATTUCK  DOB: 06-Sep-1933  PCP: Lavera Guise, MD [id number]  DOA: 03/29/2019  LOS: 9 days   Cc: Left-sided weakness for a week  Brief narrative: Christy Carter is a 85 y.o. female with PMH of hypertension, gout, hyperlipidemia, arthritis, hypothyroidism, recent ischemic stroke s/p and angioplasty who presented with complaint of increased confusion, unsteady gait and increase left-sided weakness since 1 week.   Stroke work-up was started at [facility].   MRI brain showed small acute lacunar infarct in the subcortical white matter of the right middle frontal gyrus.  MRI head and neck also showed recurrent critical stenosis/near occlusion of the right MCA M1 segment which was angioplastied in September 2020.   Admitting physician discussed with neurologist on-call Dr. Amie Portland who recommended transfer to [facility] for possible endovascular evaluation to assess if she would be a candidate for repeat angioplasty versus stent. Neurologist and neuro intervention radiologist were consulted. 11/25, patient underwent right common carotid arteriogram followed by stent assisted angioplasty.  Subjective: Patient was seen and examined this morning in neuro ICU.   Opens eyes and follow command.  Not in distress.  Not in pain.  On low-flow oxygen by nasal cannula.   On clevidipine drip.   Assessment/Plan: Acute ischemic stroke: -Secondary to recurrent critical stenosis/near occlusion of the right MCA M1 segment. -MRI brain, MRA head ad neck findings as above. -Neurology consultation appreciated.  -Continue aspirin, Brilinta and atorvastatin -PT eval was obtained. Recommend SNF placement.  Intracranial vascular stenosis -MRI head and neck showed recurrent critical stenosis/near occlusion of the right MCA M1 segment which was angioplastied in September 2020. -Seen by neuro intervention radiology Dr. Estanislado Pandy.  Diagnostic CT angiogram on 11/19.  Severe preocclusive  stenosis of the right MCA M1 segment was identified.   -11/25, patient underwent right common carotid arteriogram followed by stent assisted angioplasty today.  -CT scan postprocedure yesterday showed mild to moderate basal gangia area.  Hypertension -Home meds include clonidine weekly patch and oral Zebeta.   -Added amlodipine 10 mg daily.  Blood pressure is better controlled now. Continue hydralazine IV as needed.   Hypothyroidism:  -Continue levothyroxine.  Mixed hyperlipidemia:  -Reviewed lipid panel from September 2020.   -Continue statin.  Gout: Stable we will continue allopurinol.  Depression: Cont. Prozac & Seroquel  GERD: cont. PPI.  Mobility: PT/OT consultation obtained. SNF recommended. DVT prophylaxis:  Lovenox subcu Code Status:   Code Status: Full Code  Family Communication:  Expected Discharge:  Remains in neuro ICU today.  Ultimate plan for SNF. Consultants:  Neurology, neuroradiology  Procedures:    Antimicrobials: Anti-infectives (From admission, onward)   Start     Dose/Rate Route Frequency Ordered Stop   04/05/19 1146  ceFAZolin (ANCEF) 2-4 GM/100ML-% IVPB    Note to Pharmacy: Nyoka Cowden   : cabinet override      04/05/19 1146 04/05/19 2359   04/05/19 0845  ceFAZolin (ANCEF) IVPB 2g/100 mL premix     2 g 200 mL/hr over 30 Minutes Intravenous To Radiology 04/05/19 0831 04/06/19 0845      Diet Order            DIET DYS 3 Room service appropriate? Yes; Fluid consistency: Thin  Diet effective now              Infusions:  . sodium chloride 75 mL/hr at 04/07/19 1200    Scheduled Meds: . allopurinol  100 mg Oral QHS  . amLODipine  10  mg Oral Daily  . aspirin  81 mg Oral Daily   Or  . aspirin  81 mg Per Tube Daily  . atorvastatin  40 mg Oral q1800  . bisoprolol  2.5 mg Oral Daily  . Chlorhexidine Gluconate Cloth  6 each Topical Daily  . FLUoxetine  10 mg Oral Daily  . levothyroxine  150 mcg Oral Q0600  . mupirocin ointment   1 application Nasal BID  . pantoprazole  40 mg Oral Daily  . QUEtiapine  25 mg Oral QHS  . ticagrelor  90 mg Oral BID   Or  . ticagrelor  90 mg Per Tube BID    PRN meds: acetaminophen **OR** acetaminophen (TYLENOL) oral liquid 160 mg/5 mL **OR** acetaminophen, eptifibatide, haloperidol lactate, hydrALAZINE, ondansetron (ZOFRAN) IV   Objective: Vitals:   04/07/19 1200 04/07/19 1230  BP: (!) 121/49 (!) 132/54  Pulse: 75 89  Resp: 19 (!) 28  Temp:    SpO2: 95% 100%    Intake/Output Summary (Last 24 hours) at 04/07/2019 1341 Last data filed at 04/07/2019 1200 Gross per 24 hour  Intake 1902.85 ml  Output 650 ml  Net 1252.85 ml   Filed Weights   04/06/19 0756  Weight: 84.4 kg   Weight change:  Body mass index is 29.13 kg/m.   Physical Exam: General exam: Lying down in bed.  Not in physical distress.  Skin: No rashes, lesions or ulcers. HEENT: Atraumatic, normocephalic, supple neck, no obvious bleeding Lungs: Clear to auscultation bilaterally CVS: Regular rate and rhythm, no murmur GI/Abd soft, nontender, nondistended, bowel sound present CNS: Open eyes closed, oriented x3. Psychiatry: Mood appropriate Extremities: No pedal edema, no calf tenderness.  Data Review: I have personally reviewed the laboratory data and studies available.  Recent Labs  Lab 03/31/19 1426 04/05/19 0307 04/07/19 0544  WBC 8.3 6.0 8.8  NEUTROABS  --  3.4 7.1  HGB 13.0 12.6 11.0*  HCT 42.1 39.2 35.0*  MCV 97.5 92.5 95.6  PLT 262 276 293   Recent Labs  Lab 03/31/19 1426 04/07/19 0544  NA 137 140  K 3.5 3.7  CL 106 109  CO2 18* 20*  GLUCOSE 85 117*  BUN 12 20  CREATININE 0.69 0.90  CALCIUM 8.9 8.2*    Terrilee Croak, MD  [facility] 04/07/2019

## 2019-04-08 ENCOUNTER — Encounter (HOSPITAL_COMMUNITY): Payer: Self-pay | Admitting: Interventional Radiology

## 2019-04-08 MED ORDER — CLONIDINE HCL 0.1 MG/24HR TD PTWK
0.1000 mg | MEDICATED_PATCH | TRANSDERMAL | Status: DC
  Administered 2019-04-11: 0.1 mg via TRANSDERMAL
  Filled 2019-04-08: qty 1

## 2019-04-08 NOTE — Progress Notes (Signed)
PROGRESS NOTE  Christy Carter  DOB: [birth year]  PCP: Lavera Guise, MD [id number]  DOA: 03/29/2019  LOS: 10 days   Cc: Left-sided weakness for a week  Brief narrative: Christy Carter is a 85 y.o. female with PMH of hypertension, gout, hyperlipidemia, arthritis, hypothyroidism, recent ischemic stroke s/p and angioplasty who presented with complaint of increased confusion, unsteady gait and increase left-sided weakness since 1 week.   Stroke work-up was started at [facility].   MRI brain showed small acute lacunar infarct in the subcortical white matter of the right middle frontal gyrus.  MRI head and neck also showed recurrent critical stenosis/near occlusion of the right MCA M1 segment which was angioplastied in September 2020.   Admitting physician discussed with neurologist on-call Dr. Amie Portland who recommended transfer to [facility] for possible endovascular evaluation to assess if she would be a candidate for repeat angioplasty versus stent. Neurologist and neuro intervention radiologist were consulted. 11/25, patient underwent right common carotid arteriogram followed by stent assisted angioplasty.  Subjective: Patient was seen and examined this morning in neuro ICU.   Sitting up in chair.  Not in distress.  She states she is bored of not doing anything in the hospital.   Assessment/Plan: Acute ischemic stroke: -Secondary to recurrent critical stenosis/near occlusion of the right MCA M1 segment. -MRI brain, MRA head ad neck findings as above. -Neurology consultation appreciated.  -Continue aspirin, Brilinta and atorvastatin -PT eval was obtained. Recommend SNF placement.  Intracranial vascular stenosis -MRI head and neck showed recurrent critical stenosis/near occlusion of the right MCA M1 segment which was angioplastied in September 2020. -Seen by neuro intervention radiology Dr. Estanislado Pandy.  Diagnostic CT angiogram on 11/19. Severe preocclusive stenosis of the right  MCA M1 segment was identified.   -11/25, patient underwent right common carotid arteriogram followed by stent assisted angioplasty today. CT scan postprocedure showed mild to moderate punctate hemorrhage in basal gangia area.  Hypertension -Home meds include clonidine weekly patch and oral Zebeta.   -Continue both.  Amlodipine added as well. Continue hydralazine IV as needed.   Hypothyroidism:  -Continue levothyroxine.  Mixed hyperlipidemia:  -Reviewed lipid panel from September 2020.   -Continue statin.  Gout: Stable, We will continue allopurinol.  Depression: Cont. Prozac & Seroquel  GERD: cont. PPI.  Mobility: PT/OT consultation obtained. SNF recommended. DVT prophylaxis:  Lovenox subcu Code Status:   Code Status: Full Code  Family Communication:  Expected Discharge:  Noted a plan to transfer out of neuro ICU today.  Ultimate plan to SNF when bed available.  Consultants:  Neurology, neuroradiology  Procedures:    Antimicrobials: Anti-infectives (From admission, onward)   Start     Dose/Rate Route Frequency Ordered Stop   04/05/19 1146  ceFAZolin (ANCEF) 2-4 GM/100ML-% IVPB    Note to Pharmacy: Nyoka Cowden   : cabinet override      04/05/19 1146 04/05/19 2359   04/05/19 0845  ceFAZolin (ANCEF) IVPB 2g/100 mL premix     2 g 200 mL/hr over 30 Minutes Intravenous To Radiology 04/05/19 0831 04/06/19 0845      Diet Order            DIET DYS 3 Room service appropriate? Yes; Fluid consistency: Thin  Diet effective now              Infusions:  . sodium chloride 75 mL/hr at 04/08/19 0800    Scheduled Meds: . allopurinol  100 mg Oral QHS  . amLODipine  10  mg Oral Daily  . aspirin  81 mg Oral Daily   Or  . aspirin  81 mg Per Tube Daily  . atorvastatin  40 mg Oral q1800  . bisoprolol  2.5 mg Oral Daily  . Chlorhexidine Gluconate Cloth  6 each Topical Daily  . FLUoxetine  10 mg Oral Daily  . levothyroxine  150 mcg Oral Q0600  . mupirocin ointment   1 application Nasal BID  . pantoprazole  40 mg Oral Daily  . QUEtiapine  25 mg Oral QHS  . ticagrelor  90 mg Oral BID   Or  . ticagrelor  90 mg Per Tube BID    PRN meds: acetaminophen **OR** acetaminophen (TYLENOL) oral liquid 160 mg/5 mL **OR** acetaminophen, eptifibatide, haloperidol lactate, hydrALAZINE, ondansetron (ZOFRAN) IV   Objective: Vitals:   04/08/19 0800 04/08/19 0900  BP: 111/67 (!) 152/77  Pulse: 66   Resp: 16 (!) 22  Temp:    SpO2: 100%     Intake/Output Summary (Last 24 hours) at 04/08/2019 1158 Last data filed at 04/08/2019 0800 Gross per 24 hour  Intake 1563.59 ml  Output 875 ml  Net 688.59 ml   Filed Weights   04/06/19 0756  Weight: 84.4 kg   Weight change:  Body mass index is 29.13 kg/m.   Physical Exam: General exam: Lying down in bed.  Not in physical distress.  Skin: No rashes, lesions or ulcers. HEENT: Atraumatic, normocephalic, supple neck, no obvious bleeding Lungs: Clear to auscultation bilaterally CVS: Regular rate and rhythm, no murmur GI/Abd soft, nontender, nondistended, bowel sound present CNS: Open eyes closed, oriented x3. Psychiatry: Mood appropriate Extremities: No pedal edema, no calf tenderness.  Data Review: I have personally reviewed the laboratory data and studies available.  Recent Labs  Lab 04/05/19 0307 04/07/19 0544  WBC 6.0 8.8  NEUTROABS 3.4 7.1  HGB 12.6 11.0*  HCT 39.2 35.0*  MCV 92.5 95.6  PLT 276 293   Recent Labs  Lab 04/07/19 0544  NA 140  K 3.7  CL 109  CO2 20*  GLUCOSE 117*  BUN 20  CREATININE 0.90  CALCIUM 8.2*    Terrilee Croak, MD  [facility] 04/08/2019

## 2019-04-08 NOTE — Progress Notes (Addendum)
Physical Therapy Treatment Patient Details Name: Christy Carter MRN: [id number] DOB: Jan 07, 1934 Today's Date: 04/08/2019    History of Present Illness 85 y.o. female with PMHx: HTN, gout, hyperlipidemia, arthritis, hypothyroidism, recent ischemic stroke s/p and angioplasty presented with complaint of increased confusion, unsteady gait and increase left-sided weakness for 1 week.  Stroke work-up was done at [facility].  MRI brain showed small acute lacunar infarct in the subcortical white matter of the right middle frontal gyrus.  MRI head and neck also showed recurrent critical stenosis/near occlusion of the right MCA M1 segment which was angioplastied in September 2020. 11/25 stent    PT Comments    Pt pleasant and very eager to move stating her back is sore. Pt with improved sitting balance and moving all extremities weakly with left lean in sitting. Pt with urinary incontinence and benefits from use of purewick during mobility to maximize transfers without limitation. Pt with repeated standing trials this session but required stedy to pivot to chair as unable to unweight feet in standing or achieve fully upright as pt maintains crouched posture. SNF remains appropriate.  SpO2 98% on RA    Follow Up Recommendations  SNF;Supervision/Assistance - 24 hour     Equipment Recommendations  Wheelchair (measurements PT);Wheelchair cushion (measurements PT)    Recommendations for Other Services       Precautions / Restrictions Precautions Precautions: Fall Precaution Comments: L inattention, left hemiparesis    Mobility  Bed Mobility Overal bed mobility: Needs Assistance Bed Mobility: Rolling;Sidelying to Sit   Sidelying to sit: Max assist;+2 for physical assistance       General bed mobility comments: rolling left with max assist with cues for sequence, max +2 to roll right. Physical assist to bring legs off of bed and elevate trunk from sidelying right with HOB 35 degrees. assist to scoot  to EOB  Transfers Overall transfer level: Needs assistance   Transfers: Sit to/from Stand Sit to Stand: Max assist;+2 physical assistance Stand pivot transfers: Mod assist;+2 physical assistance       General transfer comment: pt able to stand from bed x 3 trials with max +2 assist with use of pad to cradle and support sacrum to standing with multimodal cues for UE positioning and support. Pt unable to successfully advance feet or weight shift in standing to pivot to chair with bil UE support. With use of stedy pt able to stand with mod +2 assist and pivot to chair with assist for trunk control due to left lean  Ambulation/Gait             General Gait Details: unable   Stairs             Wheelchair Mobility    Modified Rankin (Stroke Patients Only) Modified Rankin (Stroke Patients Only) Pre-Morbid Rankin Score: Moderate disability Modified Rankin: Severe disability     Balance Overall balance assessment: Needs assistance Sitting-balance support: Single extremity supported;Feet supported Sitting balance-Leahy Scale: Poor Sitting balance - Comments: min assist for sitting balance with left lean Postural control: Left lateral lean Standing balance support: Bilateral upper extremity supported;During functional activity Standing balance-Leahy Scale: Poor Standing balance comment: bil UE support and physical assist to stand                            Cognition Arousal/Alertness: Awake/alert Behavior During Therapy: WFL for tasks assessed/performed Overall Cognitive Status: Impaired/Different from baseline Area of Impairment: Safety/judgement;Problem solving  Following Commands: Follows one step commands consistently Safety/Judgement: Decreased awareness of safety;Decreased awareness of deficits   Problem Solving: Slow processing;Decreased initiation;Difficulty sequencing;Requires verbal cues;Requires tactile  cues General Comments: pt pleasant, joking and following single step commands consistently      Exercises      General Comments        Pertinent Vitals/Pain Pain Assessment: No/denies pain    Home Living                      Prior Function            PT Goals (current goals can now be found in the care plan section) Acute Rehab PT Goals Time For Goal Achievement: 04/22/19 Potential to Achieve Goals: Fair Progress towards PT goals: Goals downgraded-see care plan    Frequency    Min 3X/week      PT Plan Current plan remains appropriate    Co-evaluation              AM-PAC PT "6 Clicks" Mobility   Outcome Measure  Help needed turning from your back to your side while in a flat bed without using bedrails?: A Lot Help needed moving from lying on your back to sitting on the side of a flat bed without using bedrails?: A Lot Help needed moving to and from a bed to a chair (including a wheelchair)?: A Lot Help needed standing up from a chair using your arms (e.g., wheelchair or bedside chair)?: Total Help needed to walk in hospital room?: Total Help needed climbing 3-5 steps with a railing? : Total 6 Click Score: 9    End of Session Equipment Utilized During Treatment: Gait belt Activity Tolerance: Patient tolerated treatment well Patient left: in chair;with call bell/phone within reach;with chair alarm set Nurse Communication: Mobility status;Need for lift equipment PT Visit Diagnosis: Muscle weakness (generalized) (M62.81);Difficulty in walking, not elsewhere classified (R26.2)     Time: [id number] PT Time Calculation (min) (ACUTE ONLY): 26 min  Charges:  $Therapeutic Activity: 23-37 mins                     Oliwia Berzins P, PT Acute Rehabilitation Services Pager: [phone] Office: [place] A[place] Kreeger 04/08/2019, 1:24 PM

## 2019-04-08 NOTE — Progress Notes (Addendum)
STROKE TEAM PROGRESS NOTE   INTERVAL HISTORY Patient is sitting up in bedside chair.  She is more awake alert and interactive today.  Left-sided weakness and slurred speech have improved.  Blood pressure adequately controlled.  No new complaints. Vitals:   04/08/19 0600 04/08/19 0700 04/08/19 0800 04/08/19 0900  BP: 136/67 (!) 129/48 111/67 (!) 152/77  Pulse: 69 74 66   Resp: 20 18 16  (!) 22  Temp:      TempSrc:      SpO2: 99% 96% 100%   Weight:      Height:        CBC:  Recent Labs  Lab 04/05/19 0307 04/07/19 0544  WBC 6.0 8.8  NEUTROABS 3.4 7.1  HGB 12.6 11.0*  HCT 39.2 35.0*  MCV 92.5 95.6  PLT 276 0000000    Basic Metabolic Panel:  Recent Labs  Lab 04/07/19 0544  NA 140  K 3.7  CL 109  CO2 20*  GLUCOSE 117*  BUN 20  CREATININE 0.90  CALCIUM 8.2*   Lipid Panel:     Component Value Date/Time   CHOL 132 03/29/2019 0806   CHOL 98 09/07/2013 0358   TRIG 127 03/29/2019 0806   TRIG 63 09/07/2013 0358   HDL 41 03/29/2019 0806   HDL 43 09/07/2013 0358   CHOLHDL 3.2 03/29/2019 0806   VLDL 25 03/29/2019 0806   VLDL 13 09/07/2013 0358   LDLCALC 66 03/29/2019 0806   LDLCALC 42 09/07/2013 0358   HgbA1c:  Lab Results  Component Value Date   HGBA1C 5.5 03/29/2019   Urine Drug Screen: No results found for: LABOPIA, COCAINSCRNUR, LABBENZ, AMPHETMU, THCU, LABBARB  Alcohol Level No results found for: ETH  IMAGING  Ct Head Wo Contrast 04/06/2019 IMPRESSION:  Right MCA stent placed today. Small areas of acute hemorrhage in the right basal ganglia have developed since prior studies and appear acute. Atrophy and chronic ischemia.  No acute ischemic infarction.     PHYSICAL EXAM   General -obese elderly Caucasian lady in no apparent distress.  Ophthalmologic - fundi not visualized due to noncooperation.  Cardiovascular - Regular rhythm and rate.  Mental Status -  She is awake alert and interactive.  Diminished attention, registration and recall.    Speech is  clear follows only simple midline and one-step commands.  Cranial Nerves II - XII - II - Visual field intact OU. III, IV, VI - Extraocular movements intact. V - Facial sensation intact bilaterally. VII - Facial movement intact bilaterally. VIII - Hearing & vestibular intact bilaterally. X - Palate elevates symmetrically. XI - Chin turning & shoulder shrug intact bilaterally. XII - Tongue protrusion intact.  Motor Strength - The patient's strength was normal in right upper and lower extremities, however, left UE and LE 4/5. Bulk was normal and fasciculations were absent.  Diminished fine finger movements on the left.  Orbits right over left upper extremity. Motor Tone - Muscle tone was assessed at the neck and appendages and was normal.  Reflexes - The patient's reflexes were symmetrical in all extremities    Sensory - Light touch, temperature/pinprick were assessed and were symmetrical.    Coordination - The patient had normal movements in the right hand with no ataxia or dysmetria, left FTN mild dysmetria.  Tremor was absent.  Gait and Station - deferred.   ASSESSMENT/PLAN Christy Carter is a 85 y.o. female with history of R MCA infarct s/p R M2 angioplasty in Sept 2020 presenting to [facility] 03/28/2019 with worsening L HP for the past 1 week. MRI w/ new R MCA infarct with recurrent critical stenosis/near occlusion R M1 on MRA. Transferred to [facility].   Stroke:   Small subcortical white matter R frontal lobe infarct in setting of recurrent critical R M1 stenosis/near occlusion   CT head no acute abnormality. Small vessel disease. Atrophy.   CT Head - 11/25 - Right MCA stent placed today. Small areas of acute hemorrhage in the right basal ganglia have developed since prior studies and appear acute. Atrophy and chronic ischemia.  No acute ischemic infarction.   CT CS no traumatic finding  MRI small subcortical white matter R frontal  lobe infarct. expected evolution R corona radiata infarct. Abnormal flow distal R VA.   MRA Head  Recurrent critical stenosis/near occlusion R M1 s/p angioplasty in 01/2019 w/ reconstituted R MCA. Othewise stable chronic L A1 occlusion, poor flow distal R VA, severe R PCA stenosis.   MRA  Neck stable chronic R VA occlusion   Cerebral angio ([name]) severe preocclusive stenosis of right M1  Now s/p right MCA angioplasty stent 04/06/2019  LDL 66  HgbA1c 5.5  P2Y12 68  Lovenox 40 mg sq daily for VTE prophylaxis  aspirin 81 mg daily and Brilinta (ticagrelor) 90 mg bid prior to admission, now on aspirin 81 mg daily and Brilinta (ticagrelor) 90 mg bid.   Therapy recommendations:  SNF   Disposition:  pending (has bed offers)  History of Stroke/TIA  01/2019 - R watershed territory infarct in setting of severe stenosis right M1 segment s/p IR w/ partial revascularization AND possible small left lateral posterior temporal lobe infarct AND R middle cerebellar peduncle in setting of diffuse intracranial stenosis as likely etiology. EF 65-70%, LDL 139 and A1C 5.6. on ASA and brilinta. D/c to CIR.   08/2013 - TIA, vertigo after toileting during the night w/ AP, pressure and L sided numbness, at [facility])  [phone] - stroke, residual LUE HP per pt. details not available  Baseline dementia  Agitation and sundowning in Sept led to tx w/ Seroquel  Agitated this am, on b/l hand mittens  On seroquel Qhs  Hypertension  Stable . Permissive hypertension (OK if < 220/120) but gradually normalize in 5-7 days . Long-term BP goal 130-150 given right M1 high grade stenosis  Hyperlipidemia  Home meds:  lipitor 40, resumed in hospital  LDL 66, goal < 70  Continue statin at discharge  Other Stroke Risk Factors  Advanced age  Former Cigarette smoker  Obesity, BMI 29.13, recommend weight loss, diet and exercise as appropriate   Mild CAD  Obstructive  sleep apnea  Other Active Problems  Hypothyroidism. TSH 17.492  Gout on allopurinol   Depression on prozac  GERD on PPI  Hospital day # 10  Patient is doing better today.  Recommend mobilize out of bed.  Therapy consults.  Transfer to neurology floor bed and hopefully to rehab in the next few days.. Continue aspirin 81 mg daily and Brilinta 90 mg twice daily.  Follow-up as an outpatient stroke clinic in 6 weeks.  Stroke team will sign off.  Kindly call for questions.  Discussed with Dr. Pietro Cassis  and answered questions.. Greater than 50% time during this 25-minute visit was spent on counseling and coordination of care and discussion with care team  Antony Contras, MD   To contact Stroke Continuity provider, please refer to [email or web address] After hours, contact General Neurology

## 2019-04-08 NOTE — Progress Notes (Signed)
Patient arrived to unit via bed. Patient alert at this time. Vitals stable. Telemetry verified and skin assessed. Oxygen on. Call bell and telephone left within reach. Patient clothes placed in closet. Nurse will continue to monitor.  Gwendolyn Grant, RN

## 2019-04-08 NOTE — Progress Notes (Signed)
Referring Physician(s): Dr. Leonie Man  Supervising Physician: [name]  Patient Status:  [facility] - In-pt  Chief Complaint: Follow up right MCA M1 segment stent assisted angioplasty 04/06/19 with Dr. Estanislado Pandy  Subjective:  Patient sitting up in bed, speech therapy present. She denies any complaints, states she has been eating, drinking and toileting ok. Denies any abnormal bleeding - per chart right groin site with some oozing two nights ago requiring manual pressure to achieve hemostasis.   Allergies: Patient has no known allergies.  Medications: Prior to Admission medications   Medication Sig Start Date End Date Taking? Authorizing Provider  acetaminophen (TYLENOL) 325 MG tablet Take 2 tablets (650 mg total) by mouth every 4 (four) hours as needed for mild pain (or temp > 37.5 C (99.5 F)). 03/08/19  Yes Angiulli, Lavon Paganini, PA-C  allopurinol (ZYLOPRIM) 100 MG tablet Take 2 tabs at bed time for gout 01/12/19  Yes Ronnell Freshwater, NP  aspirin EC 81 MG EC tablet Take 1 tablet (81 mg total) by mouth daily. 02/11/19  Yes Donzetta Starch, NP  atorvastatin (LIPITOR) 40 MG tablet Take 1 tablet (40 mg total) by mouth daily at 6 PM. 03/08/19  Yes Angiulli, Lavon Paganini, PA-C  bisoprolol (ZEBETA) 5 MG tablet Take 0.5 tablets (2.5 mg total) by mouth daily. 03/08/19  Yes Angiulli, Lavon Paganini, PA-C  cloNIDine (CATAPRES - DOSED IN MG/24 HR) 0.1 mg/24hr patch Place 1 patch (0.1 mg total) onto the skin every Monday. 03/14/19  Yes Angiulli, Lavon Paganini, PA-C  colchicine 0.6 MG tablet Take 1 tablet (0.6 mg total) by mouth 2 (two) times daily. 12/06/18  Yes Boscia, Heather E, NP  Cyanocobalamin (B-12) 2500 MCG TABS Take 2,500 mcg by mouth daily as needed (For Lethargy).    Yes [provider]  diclofenac sodium (VOLTAREN) 1 % GEL Apply 4 g topically 4 (four) times daily. Patient taking differently: Apply 4 g topically 4 (four) times daily as needed (For pain).  03/08/19  Yes Angiulli, Lavon Paganini, PA-C   FLUoxetine (PROZAC) 10 MG capsule Take 1 capsule (10 mg total) by mouth daily. 03/08/19  Yes Angiulli, Lavon Paganini, PA-C  levothyroxine (SYNTHROID) 150 MCG tablet Take 1 tablet (150 mcg total) by mouth daily before breakfast. 03/08/19  Yes Angiulli, Lavon Paganini, PA-C  omeprazole (PRILOSEC) 40 MG capsule Take 1 capsule (40 mg total) by mouth daily. 03/08/19  Yes Angiulli, Lavon Paganini, PA-C  QUEtiapine (SEROQUEL) 25 MG tablet Take 1 tablet (25 mg total) by mouth at bedtime. 03/08/19  Yes Angiulli, Lavon Paganini, PA-C  ticagrelor (BRILINTA) 90 MG TABS tablet Take 1 tablet (90 mg total) by mouth 2 (two) times daily. 03/08/19  Yes Angiulli, Lavon Paganini, PA-C  VENTOLIN HFA 108 (90 Base) MCG/ACT inhaler TAKE 2 PUFFS BY MOUTH EVERY 6 HOURS AS NEEDED FOR WHEEZE OR SHORTNESS OF BREATH Patient taking differently: Inhale 2 puffs into the lungs every 6 (six) hours as needed for shortness of breath.  03/19/18  Yes Boscia, Greer Ee, NP  Vitamin D, Ergocalciferol, (DRISDOL) 1.25 MG (50000 UT) CAPS capsule Take 1 capsule (50,000 Units total) by mouth every 7 (seven) days. 02/02/19  Yes Kendell Bane, NP     Vital Signs: BP (!) 152/77   Pulse 66   Temp 98.4 F (36.9 C) (Oral)   Resp (!) 22   Ht 5\' 7"  (1.702 m)   Wt 186 lb (84.4 kg)   SpO2 100%   BMI 29.13 kg/m   Physical Exam Vitals  signs and nursing note reviewed.  Cardiovascular:     Rate and Rhythm: Normal rate and regular rhythm.     Pulses: Normal pulses.  Pulmonary:     Effort: Pulmonary effort is normal.     Breath sounds: Normal breath sounds.  Skin:    General: Skin is warm and dry.     Comments: (+) right CFA puncture site clean, dry, dressed appropriately - soft, no active bleeding. No pain to palpation.   Neurological:     Mental Status: She is alert.   Alert, awake, and oriented x3 Speech and comprehension in tact PERRL bilaterally EOMs without nystagmus or subjective diplopia. Visual fields not assessed No obvious facial asymmetry. Tongue  midline Motor power 4/5 left upper extremity, 5/5 all other extremities  Fine motor and coordination grossly intact Gait not assessed Romberg not assessed Heel to toe not assessed Distal pulses palpable bilaterally   Imaging: Ct Head Wo Contrast  Result Date: 04/06/2019 CLINICAL DATA:  Severe headache. Altered mental status. Right MCA stent placed 04/06/2019 EXAM: CT HEAD WITHOUT CONTRAST TECHNIQUE: Contiguous axial images were obtained from the base of the skull through the vertex without intravenous contrast. COMPARISON:  CT head 03/28/2019 FINDINGS: Brain: Interval placement of right MCA stent in good position. Small area of acute hemorrhage in the right basal ganglia measuring approximately 5 mm. Smaller additional areas of acute hemorrhage also in the right basal ganglia. No subarachnoid hemorrhage. Moderate ventricular enlargement with atrophy unchanged. Chronic ischemic changes in the white matter bilaterally. Vascular: Right MCA stent in good position. Negative for hyperdense vessel Skull: Negative Sinuses/Orbits: Negative Other: Motion degraded study IMPRESSION: Right MCA stent placed today. Small areas of acute hemorrhage in the right basal ganglia have developed since prior studies and appear acute. Atrophy and chronic ischemia.  No acute ischemic infarction. Electronically Signed   By: Franchot Gallo M.D.   On: 04/06/2019 12:18    Labs:  CBC: Recent Labs    03/29/19 1813 03/31/19 1426 04/05/19 0307 04/07/19 0544  WBC 8.1 8.3 6.0 8.8  HGB 13.0 13.0 12.6 11.0*  HCT 39.4 42.1 39.2 35.0*  PLT 227 262 276 293    COAGS: Recent Labs    03/28/19 2114 04/05/19 0307 04/06/19 1401  INR 1.0  --   --   APTT 27 32 163*    BMP: Recent Labs    03/28/19 1457 03/29/19 0806 03/29/19 1813 03/31/19 1426 04/07/19 0544  NA 140 139  --  137 140  K 4.2 3.8  --  3.5 3.7  CL 103 105  --  106 109  CO2 25 26  --  18* 20*  GLUCOSE 102* 104*  --  85 117*  BUN 21 17  --  12 20   CALCIUM 9.6 9.1  --  8.9 8.2*  CREATININE 0.86 0.71 0.77 0.69 0.90  GFRNONAA >60 >60 >60 >60 59*  GFRAA >60 >60 >60 >60 >60    LIVER FUNCTION TESTS: Recent Labs    11/25/18 1046 02/14/19 0748 03/28/19 1457 03/29/19 0806  BILITOT 0.2 0.9 1.0 0.8  AST 14 23 21  12*  ALT 12 20 10 8   ALKPHOS 90 69 97 79  PROT 6.5 6.8 7.2 6.4*  ALBUMIN 3.9 3.3* 3.7 3.3*    Assessment and Plan:  85 y/o F s/p right MCA M1 segment stent assisted angioplasty 04/06/19 with Dr. Estanislado Pandy - some right CFA puncture site oozing reported overnight 11/25 but none since then. Groin soft, no pain to palpation,  no active bleeding/hematoma. Neuro exam remarkable for slight left upper extremity weakness today which is baseline for her.   From [facility] standpoint plan for patient to continue ASA 81 mg QD + Brilinta 90 mg BID while admitted and upon discharge, she will follow up with our service approximately 4 weeks post discharge. Further inpatient plans per neurology service.   Please call with questions or concerns.   Electronically Signed: Joaquim Nam, PA-C 04/08/2019, 9:09 AM   I spent a total of 15 Minutes at the the patient's bedside AND on the patient's hospital floor or unit, greater than 50% of which was counseling/coordinating care for follow up right MCA stent assisted angioplasty.

## 2019-04-08 NOTE — Progress Notes (Signed)
  Speech Language Pathology Treatment: Cognitive-Linquistic  Patient Details Name: Christy Carter MRN: [id number] DOB: 21-Jun-1933 Today's Date: 04/08/2019 Time: [id number] SLP Time Calculation (min) (ACUTE ONLY): 16 min  Assessment / Plan / Recommendation Clinical Impression  Skilled treatment session focused on cognition goals. SLP received pt upright in bed, bilateral mittens in place d/t pt restless over night. Pt presented as irritated. Pt is known to this Probation officer from [employer] stay (this Probation officer was pt's primary SLP). Pt didn't recognize this Probation officer and required Max A cues to Optician, dispensing. Pt required Mod A cues to recall location, month and situation but then she was able to recall after 3 minutes correctly. However, despite being able to recall information, current location doesn't appear comprehended by pt as she asked when were "they going to get rid of the hospital bed" indicating that she thought she was at home. Pt also needed frequent redirecting as she sought to get out bed multiple times during session.  She lacks awareness of current deficits. Pt required Mod A cues to follow 1 step directions and demonstrated poor task tolerance as she frequently voiced frustration with being asked "so many questions." Of note, pt had refused her breakfast but with Mod A encouragement, pt agreeable to consuming thin water with this Probation officer. Pt with no overt s/s of aspiration.  Pt left upright in bed, bed alarm on and mittens off (nursing aware and monitoring pt).  ST to continue to follow.    HPI HPI: 85 y.o. female with medical history significant of hypertension, gout, hyperlipidemia, arthritis, hypothyroidism, recent ischemic stroke s/p and angioplasty presented with complaint of increased confusion, unsteady gait and increase left-sided weakness for 1 week.  Stroke work-up was done at [facility].  MRI brain showed small acute lacunar infarct in the subcortical white matter of the right middle frontal gyrus.  MRI  head and neck also showed recurrent critical stenosis/near occlusion of the right MCA M1 segment which was angioplastied in September 2020.  Patient was discussed by admitting physician with neurologist on-call Dr. Amie [place] who recommended transfer to [facility] for possible endovascular evaluation to assess if she would be a candidate for repeat angioplasty versus stent.      SLP Plan  Continue with current plan of care       Recommendations   SNF                Oral Care Recommendations: Oral care BID Follow up Recommendations: Skilled Nursing facility SLP Visit Diagnosis: Cognitive communication deficit (R41.841) Attention and concentration deficit following: Cerebral infarction Plan: Continue with current plan of care       [place] 04/08/2019, 12:28 PM

## 2019-04-09 LAB — SARS CORONAVIRUS 2 (TAT 6-24 HRS): SARS Coronavirus 2: NEGATIVE

## 2019-04-09 MED ORDER — SENNOSIDES-DOCUSATE SODIUM 8.6-50 MG PO TABS
1.0000 | ORAL_TABLET | Freq: Every evening | ORAL | Status: DC | PRN
  Administered 2019-04-09 – 2019-04-10 (×2): 1 via ORAL
  Filled 2019-04-09 (×2): qty 1

## 2019-04-09 NOTE — TOC Progression Note (Signed)
Transition of Care [facility]) - Progression Note    Patient Details  Name: Christy Carter MRN: [id number] Date of Birth: [birth year]  Transition of Care [facility]) CM/SW [place], [place] Phone Number: 04/09/2019, 2:12 PM  Clinical Narrative:     Josem Kaufmann has been received but no number generated yet. Reference number is [id number]. Josem Kaufmann is starting today and next review date is 04/12/2019. Clinicals can be faxed too [phone].   Patient will need updated COVID. MD informed and will order one today. Plan is for discharge on Sunday morning.   CSW will continue to follow and assist with discharge planning.    Expected Discharge Plan: Skilled Nursing Facility Barriers to Discharge: Ship broker, Continued Medical Work up  Expected Discharge Plan and Services Expected Discharge Plan: [place]: [place] arrangements for the past 2 months: Single Family Home                 DME Arranged: Air overlay mattress                     Social Determinants of Health (SDOH) Interventions    Readmission Risk Interventions No flowsheet data found.

## 2019-04-09 NOTE — Progress Notes (Signed)
PROGRESS NOTE  Christy Carter  DOB: Apr 27, 1934  PCP: Lavera Guise, MD [id number]  DOA: 03/29/2019  LOS: 11 days   Cc: Left-sided weakness for a week  Brief narrative: Christy Carter is a 85 y.o. female with PMH of hypertension, gout, hyperlipidemia, arthritis, hypothyroidism, recent ischemic stroke s/p and angioplasty who presented with complaint of increased confusion, unsteady gait and increase left-sided weakness since 1 week.   Stroke work-up was started at [facility].   MRI brain showed small acute lacunar infarct in the subcortical white matter of the right middle frontal gyrus.  MRI head and neck also showed recurrent critical stenosis/near occlusion of the right MCA M1 segment which was angioplastied in September 2020.   Admitting physician discussed with neurologist on-call Christy Carter who recommended transfer to [facility] for possible endovascular evaluation to assess if she would be a candidate for repeat angioplasty versus stent. Neurologist and neuro intervention radiologist were consulted. 11/25, patient underwent right common carotid arteriogram followed by stent assisted angioplasty.  Subjective: Patient was seen and examined this morning.  Patient daughter at bedside. Transferred out of neuro ICU yesterday. Patient feels bored.  No distress.  Not confused.  On low-flow oxygen.  Assessment/Plan: Acute ischemic stroke: -Presented with 1 week history of left-sided weakness, unsteady gait and confusion.  -Found to have small acute lacunar infarct in the subcortical white matter of the right middle frontal gyrus. -Neurology consultation appreciated.  -Continue aspirin, Brilinta and atorvastatin -PT eval was obtained. Recommend SNF placement.  Intracranial vascular stenosis -MRI head and neck showed recurrent critical stenosis/near occlusion of the right MCA M1 segment which was angioplastied in September 2020. -Seen by neuro intervention radiology Dr.  Estanislado Carter.  Diagnostic CT angiogram on 11/19. Severe preocclusive stenosis of the right MCA M1 segment was identified.   -11/25, patient underwent right common carotid arteriogram followed by stent assisted angioplasty. CT scan postprocedure showed mild to moderate punctate hemorrhage in basal gangia area. -On aspirin, Brilinta and atorvastatin.  Hypertension -Home meds include clonidine weekly patch and oral Zebeta.   -Continue both.  Amlodipine added on this admission. Continue hydralazine IV as needed.   Hypothyroidism:  -Continue levothyroxine.  Mixed hyperlipidemia:  -Reviewed lipid panel from September 2020.   -Continue statin.  Gout: Stable, continue allopurinol.  Depression: Cont. Prozac & Seroquel  GERD: cont. PPI.  Mobility: PT/OT consultation obtained. SNF recommended. DVT prophylaxis:  Lovenox subcu Code Status:   Code Status: Full Code  Family Communication:  Expected Discharge:  Bed available at SNF for discharge tomorrow.  Covid ordered. Consultants:  Neurology, neuroradiology  Procedures:    Antimicrobials: Anti-infectives (From admission, onward)   Start     Dose/Rate Route Frequency Ordered Stop   04/05/19 1146  ceFAZolin (ANCEF) 2-4 GM/100ML-% IVPB    Note to Pharmacy: Nyoka Cowden   : cabinet override      04/05/19 1146 04/05/19 2359   04/05/19 0845  ceFAZolin (ANCEF) IVPB 2g/100 mL premix     2 g 200 mL/hr over 30 Minutes Intravenous To Radiology 04/05/19 0831 04/06/19 0845      Diet Order            DIET DYS 3 Room service appropriate? Yes; Fluid consistency: Thin  Diet effective now              Infusions:    Scheduled Meds: . allopurinol  100 mg Oral QHS  . amLODipine  10 mg Oral Daily  . aspirin  81  mg Oral Daily   Or  . aspirin  81 mg Per Tube Daily  . atorvastatin  40 mg Oral q1800  . bisoprolol  2.5 mg Oral Daily  . Chlorhexidine Gluconate Cloth  6 each Topical Daily  . [START ON 04/11/2019] cloNIDine  0.1 mg  Transdermal Q Mon  . FLUoxetine  10 mg Oral Daily  . levothyroxine  150 mcg Oral Q0600  . mupirocin ointment  1 application Nasal BID  . pantoprazole  40 mg Oral Daily  . QUEtiapine  25 mg Oral QHS  . ticagrelor  90 mg Oral BID   Or  . ticagrelor  90 mg Per Tube BID    PRN meds: acetaminophen **OR** acetaminophen (TYLENOL) oral liquid 160 mg/5 mL **OR** acetaminophen, haloperidol lactate, hydrALAZINE, ondansetron (ZOFRAN) IV, senna-docusate   Objective: Vitals:   04/09/19 0822 04/09/19 1128  BP: (!) 147/51 (!) 125/53  Pulse: 74 65  Resp: 20 20  Temp: 98.5 F (36.9 C) 98.6 F (37 C)  SpO2: 100% 96%    Intake/Output Summary (Last 24 hours) at 04/09/2019 1428 Last data filed at 04/09/2019 0500 Gross per 24 hour  Intake 120 ml  Output 1050 ml  Net -930 ml   Filed Weights   04/06/19 0756  Weight: 84.4 kg   Weight change:  Body mass index is 29.13 kg/m.   Physical Exam: General exam: Lying down in bed.  Not in physical distress. Skin: No rashes, lesions or ulcers. HEENT: Atraumatic, normocephalic, supple neck, no obvious bleeding Lungs: Clear to auscultation bilaterally CVS: Regular rate and rhythm, no murmur GI/Abd soft, nontender, nondistended, bowel sound present CNS: Alert, awake, oriented x3. Psychiatry: Mood appropriate Extremities: No pedal edema, no calf tenderness.  Data Review: I have personally reviewed the laboratory data and studies available.  Recent Labs  Lab 04/05/19 0307 04/07/19 0544  WBC 6.0 8.8  NEUTROABS 3.4 7.1  HGB 12.6 11.0*  HCT 39.2 35.0*  MCV 92.5 95.6  PLT 276 293   Recent Labs  Lab 04/07/19 0544  NA 140  K 3.7  CL 109  CO2 20*  GLUCOSE 117*  BUN 20  CREATININE 0.90  CALCIUM 8.2*    Terrilee Croak, MD  [facility] 04/09/2019

## 2019-04-09 NOTE — Progress Notes (Signed)
Patient weaned off 2L oxygen via nasal cannula. O2 Saturation in mid 90s. Nurse will continue to closely monitor.  Gwendolyn Grant, RN

## 2019-04-09 NOTE — TOC Progression Note (Signed)
Transition of Care [facility]) - Progression Note    Patient Details  Name: Christy Carter MRN: [id number] Date of Birth: [birth year]  Transition of Care [facility]) CM/SW [place], [place] Phone Number: 04/09/2019, 11:36 AM  Clinical Narrative:     CSW finally got someone on the phone from [place]. Authorization expired on 11/25. CSW re-faxed clinicals to [facility] [place].   CSW is awaiting insurance authorization. CSW will continue to follow and assist as needed.   Expected Discharge Plan: Skilled Nursing Facility Barriers to Discharge: Ship broker, Continued Medical Work up  Expected Discharge Plan and Services Expected Discharge Plan: [place] Choice: [place] arrangements for the past 2 months: Single Family Home                 DME Arranged: Air overlay mattress                     Social Determinants of Health (SDOH) Interventions    Readmission Risk Interventions No flowsheet data found.

## 2019-04-09 NOTE — TOC Progression Note (Signed)
Transition of Care [facility]) - Progression Note    Patient Details  Name: OMIA BERTZ MRN: [id number] Date of Birth: 26-Feb-1934  Transition of Care [facility]) CM/SW [place], [place] Phone Number: 04/09/2019, 8:19 AM  Clinical Narrative:     Patient medically ready. CSW called [place] to get insurance determination. CSW left a message and is awaiting a return phone call.   CSW will continue to follow and assist with disposition planning.   Expected Discharge Plan: Skilled Nursing Facility Barriers to Discharge: Ship broker, Continued Medical Work up  Expected Discharge Plan and Services Expected Discharge Plan: [place] Choice: [place] arrangements for the past 2 months: Single Family Home                 DME Arranged: Air overlay mattress                     Social Determinants of Health (SDOH) Interventions    Readmission Risk Interventions No flowsheet data found.

## 2019-04-10 DIAGNOSIS — I10 Essential (primary) hypertension: Secondary | ICD-10-CM

## 2019-04-10 NOTE — Progress Notes (Signed)
PROGRESS NOTE    Christy Carter  [id number] DOB: 08-04-1933 DOA: 03/29/2019 PCP: Lavera Guise, MD   Brief Narrative: Christy Carter is a 85 y.o. female with PMH ofhypertension, gout, hyperlipidemia, arthritis, hypothyroidism, recent ischemic stroke s/p and angioplasty who presented with complaint of increased confusion, unsteady gait and increase left-sided weakness since 1 week.  Stroke work-up was started at [facility].  MRI brain showed small acute lacunar infarct in the subcortical white matter of the right middle frontal gyrus.  MRI head and neck also showed recurrent critical stenosis/near occlusion of the right MCA M1 segment which was angioplastied in September 2020.  Admitting physician discussed with neurologist on-call Dr. Amie [place] who recommended transfer to [facility] for possible endovascular evaluation to assess if she would be a candidate for repeat angioplasty versus stent. Neurologist and neuro intervention radiologist were consulted. 11/25, patient underwent right common carotid arteriogram followed by stent assisted angioplasty Anticipated discharge tomorrow to SNF.  Assessment & Plan:   Principal Problem:   Ischemic stroke [facility]) Active Problems:   Hypothyroidism   Essential hypertension   Middle cerebral artery stenosis, right  Acute ischemic stroke: -Presented with 1 week history of left-sided weakness, unsteady gait and confusion.  -Found to have small acute lacunar infarct in the subcortical white matter of the right middle frontal gyrus. -Neurology consultation appreciated.  -Continue aspirin, Brilinta and atorvastatin -PT eval was obtained. Recommend SNF placement.  Intracranial vascular stenosis -MRI head and neck showed recurrent critical stenosis/near occlusion of the right MCA M1 segment which was angioplastied in September 2020. -Seen by neuro intervention radiology Dr. Estanislado Pandy.  Diagnostic CT angiogram on 11/19. Severe preocclusive  stenosis of the right MCA M1 segment was identified.   -11/25, patient underwent right common carotid arteriogram followed by stent assisted angioplasty. CT scan postprocedure showed mild to moderate punctate hemorrhage in basal gangia area. -On aspirin, Brilinta and atorvastatin.  Hypertension -Home meds include clonidine weekly patch and oral Zebeta.   -Continue both.  Amlodipine added on this admission. Continue hydralazine IV as needed.   Hypothyroidism:  -Continue levothyroxine.  Mixed hyperlipidemia:  -Reviewed lipid panel from September 2020. -Continue statin.  Gout: Stable, continue allopurinol.  Depression: Cont. Prozac & Seroquel  GERD: cont. PPI.  DVT prophylaxis:  Lovenox Subcu Code Status: Full code. Family Communication: Disposition Plan:  SNF  discharge tomorrow. Covid negative.  Consultants:   Neurology, Neuroradiology.  Procedures:  Antimicrobials: Anti-infectives (From admission, onward)   Start     Dose/Rate Route Frequency Ordered Stop   04/05/19 1146  ceFAZolin (ANCEF) 2-4 GM/100ML-% IVPB    Note to Pharmacy: Nyoka Cowden   : cabinet override      04/05/19 1146 04/05/19 2359   04/05/19 0845  ceFAZolin (ANCEF) IVPB 2g/100 mL premix     2 g 200 mL/hr over 30 Minutes Intravenous To Radiology 04/05/19 0831 04/06/19 0845      Subjective: Patient seen and examined this morning.  Patient is lying comfortably without any distress,  does not appear confused. RN reports No overnight events.  Objective: Vitals:   04/09/19 2336 04/10/19 0336 04/10/19 0720 04/10/19 1221  BP: (!) 130/55 (!) 166/64 (!) 171/70 (!) 129/41  Pulse: (!) 59 78 76 67  Resp: 20 18 18 18   Temp: 97.6 F (36.4 C) 98.4 F (36.9 C) 97.8 F (36.6 C) 98.6 F (37 C)  TempSrc: Oral Oral Oral Oral  SpO2: 95% 98% 95% 95%  Weight:      Height:  Intake/Output Summary (Last 24 hours) at 04/10/2019 1406 Last data filed at 04/10/2019 0346 Gross per 24 hour  Intake  240 ml  Output 1000 ml  Net -760 ml   Filed Weights   04/06/19 0756  Weight: 84.4 kg    Examination: General exam: Appears calm and comfortable, not in any distress.  Respiratory system: Clear to auscultation. Respiratory effort normal. Cardiovascular system: S1 & S2 heard, RRR. No JVD, murmurs, rubs, gallops or clicks. No pedal edema. Gastrointestinal system: Abdomen is nondistended, soft and nontender. No organomegaly or masses felt. Normal bowel sounds heard. Central nervous system: Alert and oriented. No focal neurological deficits. Extremities:  No pedal edema,  No calf tenderness. Skin: No rashes, lesions or ulcers Psychiatry: Judgement and insight appear normal. Mood & affect appropriate.   Data Reviewed: I have personally reviewed following labs and imaging studies  CBC: Recent Labs  Lab 04/05/19 0307 04/07/19 0544  WBC 6.0 8.8  NEUTROABS 3.4 7.1  HGB 12.6 11.0*  HCT 39.2 35.0*  MCV 92.5 95.6  PLT 276 0000000   Basic Metabolic Panel: Recent Labs  Lab 04/07/19 0544  NA 140  K 3.7  CL 109  CO2 20*  GLUCOSE 117*  BUN 20  CREATININE 0.90  CALCIUM 8.2*   GFR: Estimated Creatinine Clearance: 52.9 mL/min (by C-G formula based on SCr of 0.9 mg/dL). Liver Function Tests: No results for input(s): AST, ALT, ALKPHOS, BILITOT, PROT, ALBUMIN in the last 168 hours. No results for input(s): LIPASE, AMYLASE in the last 168 hours. No results for input(s): AMMONIA in the last 168 hours. Coagulation Profile: No results for input(s): INR, PROTIME in the last 168 hours. Cardiac Enzymes: No results for input(s): CKTOTAL, CKMB, CKMBINDEX, TROPONINI in the last 168 hours. BNP (last 3 results) No results for input(s): PROBNP in the last 8760 hours. HbA1C: No results for input(s): HGBA1C in the last 72 hours. CBG: No results for input(s): GLUCAP in the last 168 hours. Lipid Profile: No results for input(s): CHOL, HDL, LDLCALC, TRIG, CHOLHDL, LDLDIRECT in the last 72  hours. Thyroid Function Tests: No results for input(s): TSH, T4TOTAL, FREET4, T3FREE, THYROIDAB in the last 72 hours. Anemia Panel: No results for input(s): VITAMINB12, FOLATE, FERRITIN, TIBC, IRON, RETICCTPCT in the last 72 hours. Sepsis Labs: No results for input(s): PROCALCITON, LATICACIDVEN in the last 168 hours.  Recent Results (from the past 240 hour(s))  Surgical PCR screen     Status: Abnormal   Collection Time: 04/04/19  8:24 PM   Specimen: Nasal Mucosa; Nasal Swab  Result Value Ref Range Status   MRSA, PCR POSITIVE (A) NEGATIVE Final   Staphylococcus aureus POSITIVE (A) NEGATIVE Final    Comment: RESULT CALLED TO, READ BACK BY AND VERIFIED WITH: JS RN 04/04/19 2220 JDW (NOTE) The Xpert SA Assay (FDA approved for NASAL specimens in patients 63 years of age and older), is one component of a comprehensive surveillance program. It is not intended to diagnose infection nor to guide or monitor treatment. Performed at [facility], [place] [address], [place], [place] [address]   MRSA PCR Screening     Status: None   Collection Time: 04/06/19  1:48 PM   Specimen: Nasal Mucosa; Nasopharyngeal  Result Value Ref Range Status   MRSA by PCR NEGATIVE NEGATIVE Final    Comment:        The GeneXpert MRSA Assay (FDA approved for NASAL specimens only), is one component of a comprehensive MRSA colonization surveillance program. It is not  intended to diagnose MRSA infection nor to guide or monitor treatment for MRSA infections. Performed at [facility], [place] [address], [place], [place] [address]   SARS CORONAVIRUS 2 (TAT 6-24 HRS) Nasopharyngeal Nasopharyngeal Swab     Status: None   Collection Time: 04/09/19  3:19 PM   Specimen: Nasopharyngeal Swab  Result Value Ref Range Status   SARS Coronavirus 2 NEGATIVE NEGATIVE Final    Comment: (NOTE) SARS-CoV-2 target nucleic acids are NOT DETECTED. The SARS-CoV-2 RNA is generally detectable in upper and lower respiratory  specimens during the acute phase of infection. Negative results do not preclude SARS-CoV-2 infection, do not rule out co-infections with other pathogens, and should not be used as the sole basis for treatment or other patient management decisions. Negative results must be combined with clinical observations, patient history, and epidemiological information. The expected result is Negative. Fact Sheet for Patients: SugarRoll.be Fact Sheet for Healthcare Providers: https://www.woods-mathews.com/ This test is not yet approved or cleared by the [place] FDA and  has been authorized for detection and/or diagnosis of SARS-CoV-2 by FDA under an Emergency Use Authorization (EUA). This EUA will remain  in effect (meaning this test can be used) for the duration of the COVID-19 declaration under Section 56 4(b)(1) of the Act, 21 U.S.C. section 360bbb-3(b)(1), unless the authorization is terminated or revoked sooner. Performed at [facility], [place] [address] [place], [place] [address]      Radiology Studies: No results found.  Scheduled Meds:  allopurinol  100 mg Oral QHS   amLODipine  10 mg Oral Daily   aspirin  81 mg Oral Daily   Or   aspirin  81 mg Per Tube Daily   atorvastatin  40 mg Oral q1800   bisoprolol  2.5 mg Oral Daily   Chlorhexidine Gluconate Cloth  6 each Topical Daily   [START ON 04/11/2019] cloNIDine  0.1 mg Transdermal Q Mon   FLUoxetine  10 mg Oral Daily   levothyroxine  150 mcg Oral Q0600   pantoprazole  40 mg Oral Daily   QUEtiapine  25 mg Oral QHS   ticagrelor  90 mg Oral BID   Or   ticagrelor  90 mg Per Tube BID   Continuous Infusions:   LOS: 12 days    Time spent: [place], MD [facility]   If 7PM-7AM, please contact night-coverage

## 2019-04-10 NOTE — TOC Progression Note (Signed)
Transition of Care [facility]) - Progression Note    Patient Details  Name: Christy Carter MRN: [id number] Date of Birth: March 17, 1934  Transition of Care [facility]) CM/SW [place], [place] Phone Number: 04/10/2019, 2:05 PM  Clinical Narrative:     MD informed CSW that the patient is not medically ready today due to BP being elevated. MD wants to keep overnight for observation.   Patient should be able to discharge to [facility] on Monday.   CSW will continue to follow and assist with TOC needs.   Expected Discharge Plan: Skilled Nursing Facility Barriers to Discharge: Ship broker, Continued Medical Work up  Expected Discharge Plan and Services Expected Discharge Plan: [place] Choice: [place] arrangements for the past 2 months: Single Family Home                 DME Arranged: Air overlay mattress                     Social Determinants of Health (SDOH) Interventions    Readmission Risk Interventions No flowsheet data found.

## 2019-04-11 ENCOUNTER — Ambulatory Visit: Payer: Medicare Other | Admitting: Nurse Practitioner

## 2019-04-11 DIAGNOSIS — Z7902 Long term (current) use of antithrombotics/antiplatelets: Secondary | ICD-10-CM | POA: Diagnosis not present

## 2019-04-11 DIAGNOSIS — M2550 Pain in unspecified joint: Secondary | ICD-10-CM | POA: Diagnosis not present

## 2019-04-11 DIAGNOSIS — S01112A Laceration without foreign body of left eyelid and periocular area, initial encounter: Secondary | ICD-10-CM | POA: Diagnosis not present

## 2019-04-11 DIAGNOSIS — Y939 Activity, unspecified: Secondary | ICD-10-CM | POA: Diagnosis not present

## 2019-04-11 DIAGNOSIS — R911 Solitary pulmonary nodule: Secondary | ICD-10-CM | POA: Diagnosis not present

## 2019-04-11 DIAGNOSIS — E039 Hypothyroidism, unspecified: Secondary | ICD-10-CM | POA: Diagnosis not present

## 2019-04-11 DIAGNOSIS — R6889 Other general symptoms and signs: Secondary | ICD-10-CM | POA: Diagnosis not present

## 2019-04-11 DIAGNOSIS — I69328 Other speech and language deficits following cerebral infarction: Secondary | ICD-10-CM | POA: Diagnosis not present

## 2019-04-11 DIAGNOSIS — I69354 Hemiplegia and hemiparesis following cerebral infarction affecting left non-dominant side: Secondary | ICD-10-CM | POA: Diagnosis not present

## 2019-04-11 DIAGNOSIS — Z1159 Encounter for screening for other viral diseases: Secondary | ICD-10-CM | POA: Diagnosis not present

## 2019-04-11 DIAGNOSIS — W19XXXA Unspecified fall, initial encounter: Secondary | ICD-10-CM | POA: Diagnosis not present

## 2019-04-11 DIAGNOSIS — Z7982 Long term (current) use of aspirin: Secondary | ICD-10-CM | POA: Diagnosis not present

## 2019-04-11 DIAGNOSIS — Z8673 Personal history of transient ischemic attack (TIA), and cerebral infarction without residual deficits: Secondary | ICD-10-CM | POA: Diagnosis not present

## 2019-04-11 DIAGNOSIS — D518 Other vitamin B12 deficiency anemias: Secondary | ICD-10-CM | POA: Diagnosis not present

## 2019-04-11 DIAGNOSIS — I129 Hypertensive chronic kidney disease with stage 1 through stage 4 chronic kidney disease, or unspecified chronic kidney disease: Secondary | ICD-10-CM | POA: Diagnosis not present

## 2019-04-11 DIAGNOSIS — R262 Difficulty in walking, not elsewhere classified: Secondary | ICD-10-CM | POA: Diagnosis not present

## 2019-04-11 DIAGNOSIS — R319 Hematuria, unspecified: Secondary | ICD-10-CM | POA: Diagnosis not present

## 2019-04-11 DIAGNOSIS — I6521 Occlusion and stenosis of right carotid artery: Secondary | ICD-10-CM | POA: Diagnosis not present

## 2019-04-11 DIAGNOSIS — Z79899 Other long term (current) drug therapy: Secondary | ICD-10-CM | POA: Diagnosis not present

## 2019-04-11 DIAGNOSIS — K219 Gastro-esophageal reflux disease without esophagitis: Secondary | ICD-10-CM | POA: Diagnosis not present

## 2019-04-11 DIAGNOSIS — Y92129 Unspecified place in nursing home as the place of occurrence of the external cause: Secondary | ICD-10-CM | POA: Diagnosis not present

## 2019-04-11 DIAGNOSIS — M1000 Idiopathic gout, unspecified site: Secondary | ICD-10-CM | POA: Diagnosis not present

## 2019-04-11 DIAGNOSIS — M6281 Muscle weakness (generalized): Secondary | ICD-10-CM | POA: Diagnosis not present

## 2019-04-11 DIAGNOSIS — R52 Pain, unspecified: Secondary | ICD-10-CM | POA: Diagnosis not present

## 2019-04-11 DIAGNOSIS — Z743 Need for continuous supervision: Secondary | ICD-10-CM | POA: Diagnosis not present

## 2019-04-11 DIAGNOSIS — R0902 Hypoxemia: Secondary | ICD-10-CM | POA: Diagnosis not present

## 2019-04-11 DIAGNOSIS — R58 Hemorrhage, not elsewhere classified: Secondary | ICD-10-CM | POA: Diagnosis not present

## 2019-04-11 DIAGNOSIS — D649 Anemia, unspecified: Secondary | ICD-10-CM | POA: Diagnosis not present

## 2019-04-11 DIAGNOSIS — N390 Urinary tract infection, site not specified: Secondary | ICD-10-CM | POA: Diagnosis not present

## 2019-04-11 DIAGNOSIS — S199XXA Unspecified injury of neck, initial encounter: Secondary | ICD-10-CM | POA: Diagnosis not present

## 2019-04-11 DIAGNOSIS — R2689 Other abnormalities of gait and mobility: Secondary | ICD-10-CM | POA: Diagnosis not present

## 2019-04-11 DIAGNOSIS — R2681 Unsteadiness on feet: Secondary | ICD-10-CM | POA: Diagnosis not present

## 2019-04-11 DIAGNOSIS — G4733 Obstructive sleep apnea (adult) (pediatric): Secondary | ICD-10-CM | POA: Diagnosis not present

## 2019-04-11 DIAGNOSIS — Z7401 Bed confinement status: Secondary | ICD-10-CM | POA: Diagnosis not present

## 2019-04-11 DIAGNOSIS — S0101XA Laceration without foreign body of scalp, initial encounter: Secondary | ICD-10-CM | POA: Diagnosis not present

## 2019-04-11 DIAGNOSIS — S0990XA Unspecified injury of head, initial encounter: Secondary | ICD-10-CM | POA: Diagnosis not present

## 2019-04-11 DIAGNOSIS — R404 Transient alteration of awareness: Secondary | ICD-10-CM | POA: Diagnosis not present

## 2019-04-11 DIAGNOSIS — I6350 Cerebral infarction due to unspecified occlusion or stenosis of unspecified cerebral artery: Secondary | ICD-10-CM | POA: Diagnosis not present

## 2019-04-11 DIAGNOSIS — I639 Cerebral infarction, unspecified: Secondary | ICD-10-CM | POA: Diagnosis not present

## 2019-04-11 DIAGNOSIS — I10 Essential (primary) hypertension: Secondary | ICD-10-CM | POA: Diagnosis not present

## 2019-04-11 DIAGNOSIS — Z87891 Personal history of nicotine dependence: Secondary | ICD-10-CM | POA: Diagnosis not present

## 2019-04-11 DIAGNOSIS — F0390 Unspecified dementia without behavioral disturbance: Secondary | ICD-10-CM | POA: Diagnosis not present

## 2019-04-11 DIAGNOSIS — N183 Chronic kidney disease, stage 3 unspecified: Secondary | ICD-10-CM | POA: Diagnosis not present

## 2019-04-11 DIAGNOSIS — Y999 Unspecified external cause status: Secondary | ICD-10-CM | POA: Diagnosis not present

## 2019-04-11 DIAGNOSIS — R410 Disorientation, unspecified: Secondary | ICD-10-CM | POA: Diagnosis not present

## 2019-04-11 DIAGNOSIS — E569 Vitamin deficiency, unspecified: Secondary | ICD-10-CM | POA: Diagnosis not present

## 2019-04-11 DIAGNOSIS — E785 Hyperlipidemia, unspecified: Secondary | ICD-10-CM | POA: Diagnosis not present

## 2019-04-11 DIAGNOSIS — I63511 Cerebral infarction due to unspecified occlusion or stenosis of right middle cerebral artery: Secondary | ICD-10-CM | POA: Diagnosis not present

## 2019-04-11 DIAGNOSIS — G309 Alzheimer's disease, unspecified: Secondary | ICD-10-CM | POA: Diagnosis not present

## 2019-04-11 DIAGNOSIS — I69391 Dysphagia following cerebral infarction: Secondary | ICD-10-CM | POA: Diagnosis not present

## 2019-04-11 MED ORDER — AMLODIPINE BESYLATE 10 MG PO TABS: 10.0000 mg | ORAL_TABLET | Freq: Every day | ORAL | 0 refills | Status: AC

## 2019-04-11 MED ORDER — ALLOPURINOL 100 MG PO TABS: 100.0000 mg | ORAL_TABLET | Freq: Every day | ORAL | 0 refills | Status: AC

## 2019-04-11 NOTE — Discharge Summary (Signed)
Physician Discharge Summary  Christy Carter [id number] DOB: 01/25/1934 DOA: 03/29/2019  PCP: Lavera Guise, MD  Admit date: 03/29/2019 Discharge date: 04/11/2019  Admitted From:  Home Disposition:  Skilled nursing home.  Recommendations for Outpatient Follow-up:  1. Follow up with PCP in 1-2 weeks 2. Please obtain BMP/CBC in one week 3. Follow up neurology in 6  Weeks. 4. Please follow up on the following pending results:  Home Health: SNF Equipment/Devices: None.  Discharge Condition: Stable CODE STATUS:Full code Diet recommendation: Heart Healthy / Carb Modified / Regular / Dysphagia   Brief/Interim Summary: Christy Carter is a 85 y.o. female with PMH ofhypertension, gout, hyperlipidemia, arthritis, hypothyroidism, recent ischemic stroke s/p and angioplasty who presented with complaint of increased confusion, unsteady gait and increase left-sided weakness since 1 week.  Stroke work-up was started at [facility].  MRI brain showed small acute lacunar infarct in the subcortical white matter of the right middle frontal gyrus.  MRI head and neck also showed recurrent critical stenosis/near occlusion of the right MCA M1 segment which was angioplastied in September 2020.  Admitting physician discussed with neurologist on-call Dr. Amie Portland who recommended transfer to [facility] for possible endovascular evaluation to assess if she would be a candidate for repeat angioplasty versus stent. Neurologist and neuro intervention radiologist were consulted. 11/25, patient underwent right common carotid arteriogram followed by stent assisted angioplasty.  Acute ischemic stroke: -Presented with 1 week history of left-sided weakness, unsteady gait and confusion.  -Found to have small acute lacunar infarct in the subcortical white matter of the right middle frontal gyrus. -Neurology consultation appreciated.  -Continue aspirin, Brilinta and atorvastatin after discharge. -PT eval was  obtained. Recommend SNF placement.  Intracranial vascular stenosis -MRI head and neck showed recurrent critical stenosis/near occlusion of the right MCA M1 segment which was angioplastied in September 2020. -Seen by neuro intervention radiology Dr. Estanislado Pandy.  Diagnostic CT angiogram on 11/19. Severe preocclusive stenosis of the right MCA M1 segment was identified.   -11/25, patient underwent right common carotid arteriogram followed by stent assisted angioplasty. CT scan postprocedure showed mild to moderate punctate hemorrhage in basal gangia area. -On aspirin, Brilinta and atorvastatin.  Hypertension -Home meds include clonidine weekly patch and oral Zebeta.   -Continue both.  Amlodipine added on this admission. Continue hydralazine IV as needed.   Hypothyroidism:  -Continue levothyroxine.  Mixed hyperlipidemia:  -Reviewed lipid panel from September 2020. -Continue statin.  Gout: Stable, continue allopurinol.  Depression: Cont. Prozac & Seroquel  GERD: cont. PPI.  Discharge Diagnoses:  Principal Problem:   Ischemic stroke [facility]) Active Problems:   Hypothyroidism   Essential hypertension   Middle cerebral artery stenosis, right    Discharge Instructions  Discharge Instructions    Call MD for:  difficulty breathing, headache or visual disturbances   Complete by: As directed    Call MD for:  extreme fatigue   Complete by: As directed    Call MD for:  persistant dizziness or light-headedness   Complete by: As directed    Call MD for:  persistant nausea and vomiting   Complete by: As directed    Call MD for:  temperature >100.4   Complete by: As directed    Diet - low sodium heart healthy   Complete by: As directed    Discharge instructions   Complete by: As directed    Follow up PCP in one week. Follow up Neurology in 6 weeks. Will request PCP to check cbc and  BMP.. Continue aspirin, Brilanta and statins.   Increase activity slowly   Complete by: As  directed      Allergies as of 04/11/2019   No Known Allergies     Medication List    STOP taking these medications   colchicine 0.6 MG tablet     TAKE these medications   acetaminophen 325 MG tablet Commonly known as: TYLENOL Take 2 tablets (650 mg total) by mouth every 4 (four) hours as needed for mild pain (or temp > 37.5 C (99.5 F)).   allopurinol 100 MG tablet Commonly known as: ZYLOPRIM Take 1 tablet (100 mg total) by mouth daily. What changed:   how much to take  how to take this  when to take this  additional instructions   amLODipine 10 MG tablet Commonly known as: NORVASC Take 1 tablet (10 mg total) by mouth daily.   aspirin 81 MG EC tablet Take 1 tablet (81 mg total) by mouth daily.   atorvastatin 40 MG tablet Commonly known as: LIPITOR Take 1 tablet (40 mg total) by mouth daily at 6 PM.   B-12 2500 MCG Tabs Take 2,500 mcg by mouth daily as needed (For Lethargy).   bisoprolol 5 MG tablet Commonly known as: ZEBETA Take 0.5 tablets (2.5 mg total) by mouth daily.   cloNIDine 0.1 mg/24hr patch Commonly known as: CATAPRES - Dosed in mg/24 hr Place 1 patch (0.1 mg total) onto the skin every Monday.   diclofenac sodium 1 % Gel Commonly known as: VOLTAREN Apply 4 g topically 4 (four) times daily. What changed:   when to take this  reasons to take this   FLUoxetine 10 MG capsule Commonly known as: PROZAC Take 1 capsule (10 mg total) by mouth daily.   levothyroxine 150 MCG tablet Commonly known as: SYNTHROID Take 1 tablet (150 mcg total) by mouth daily before breakfast.   omeprazole 40 MG capsule Commonly known as: PRILOSEC Take 1 capsule (40 mg total) by mouth daily.   QUEtiapine 25 MG tablet Commonly known as: SEROQUEL Take 1 tablet (25 mg total) by mouth at bedtime.   ticagrelor 90 MG Tabs tablet Commonly known as: BRILINTA Take 1 tablet (90 mg total) by mouth 2 (two) times daily.   Ventolin HFA 108 (90 Base) MCG/ACT  inhaler Generic drug: albuterol TAKE 2 PUFFS BY MOUTH EVERY 6 HOURS AS NEEDED FOR WHEEZE OR SHORTNESS OF BREATH What changed: See the new instructions.   Vitamin D (Ergocalciferol) 1.25 MG (50000 UT) Caps capsule Commonly known as: DRISDOL Take 1 capsule (50,000 Units total) by mouth every 7 (seven) days.       Contact information for follow-up providers    [name], MD Follow up.   Specialties: Interventional Radiology, Radiology Why: IR scheduler will call you with appointment date and time (~4 weeks from discharge). Please call [phone] or [phone] with questions or concerns prior to your appointment. Contact information: [place] [address] [phone]        Lavera Guise, MD Follow up.   Specialty: Internal Medicine Contact information: [address] [place] [address] [phone]        Neurology, [facility] .   Specialty: Neurology Contact information: [id number] [place] [address] [phone]            Contact information for after-discharge care    Destination    [facility] Preferred SNF .   Service: Skilled Nursing Contact information: [phone]  [place] [id number]                 No Known Allergies  Consultations:  Specify Physician/Group   Procedures/Studies: Dg Chest 2 View  Result Date: 03/28/2019 CLINICAL DATA:  Generalized weakness. EXAM: CHEST - 2 VIEW COMPARISON:  02/17/2019 and CT chest 10/13/2018. FINDINGS: Patient is rotated. Heart is enlarged, stable. Thoracic aorta is calcified. Mild bibasilar streaky opacification. No dense airspace consolidation. Tiny left pleural effusion. Left hemidiaphragm is elevated. IMPRESSION: 1. Streaky bibasilar airspace opacification may be due to atelectasis. A viral or atypical pneumonia cannot be excluded. 2. Tiny left pleural effusion. 3.  Aortic atherosclerosis (ICD10-170.0).  Electronically Signed   By: Lorin Picket M.D.   On: 03/28/2019 15:38   Ct Head Wo Contrast  Result Date: 04/06/2019 CLINICAL DATA:  Severe headache. Altered mental status. Right MCA stent placed 04/06/2019 EXAM: CT HEAD WITHOUT CONTRAST TECHNIQUE: Contiguous axial images were obtained from the base of the skull through the vertex without intravenous contrast. COMPARISON:  CT head 03/28/2019 FINDINGS: Brain: Interval placement of right MCA stent in good position. Small area of acute hemorrhage in the right basal ganglia measuring approximately 5 mm. Smaller additional areas of acute hemorrhage also in the right basal ganglia. No subarachnoid hemorrhage. Moderate ventricular enlargement with atrophy unchanged. Chronic ischemic changes in the white matter bilaterally. Vascular: Right MCA stent in good position. Negative for hyperdense vessel Skull: Negative Sinuses/Orbits: Negative Other: Motion degraded study IMPRESSION: Right MCA stent placed today. Small areas of acute hemorrhage in the right basal ganglia have developed since prior studies and appear acute. Atrophy and chronic ischemia.  No acute ischemic infarction. Electronically Signed   By: Franchot Gallo M.D.   On: 04/06/2019 12:18   Ct Head Wo Contrast  Result Date: 03/28/2019 CLINICAL DATA:  Worsening weakness over the last 2 days. EXAM: CT HEAD WITHOUT CONTRAST CT CERVICAL SPINE WITHOUT CONTRAST TECHNIQUE: Multidetector CT imaging of the head and cervical spine was performed following the standard protocol without intravenous contrast. Multiplanar CT image reconstructions of the cervical spine were also generated. COMPARISON:  MRI 02/07/2019.  CT 02/05/2019 FINDINGS: CT HEAD FINDINGS Brain: Chronic atrophy and small-vessel ischemic change of the white matter. No sign of acute infarction, mass lesion, hemorrhage, obstructive hydrocephalus or extra-axial collection. Ventriculomegaly secondary to central atrophy. Vascular: There is atherosclerotic  calcification of the major vessels at the base of the brain. Skull: Negative Sinuses/Orbits: Clear/normal Other: None CT CERVICAL SPINE FINDINGS Alignment: Normal Skull base and vertebrae: No fracture or primary bone lesion. Soft tissues and spinal canal: Negative Disc levels: Degenerative spondylosis from C3-4 through C7-T1 with disc space narrowing and small osteophytes. Facet osteoarthritis on the right at C3-4 and C4-5 and on the left at C4-5. Mild foraminal narrowing on the right at C3-4 and C4-5. No traumatic finding. Upper chest: Negative Other: None IMPRESSION: Head CT: No acute or traumatic finding. Atrophy and chronic small-vessel ischemic changes. Cervical spine CT: No acute or traumatic finding. Degenerative spondylosis and facet osteoarthritis. Electronically Signed   By: Nelson Chimes M.D.   On: 03/28/2019 15:47   Ct Cervical Spine Wo Contrast  Result Date: 03/28/2019 CLINICAL DATA:  Worsening weakness over the last 2 days. EXAM: CT HEAD WITHOUT CONTRAST CT CERVICAL SPINE WITHOUT CONTRAST TECHNIQUE: Multidetector CT imaging of the head and cervical spine was performed following the standard protocol without intravenous contrast. Multiplanar CT image reconstructions of the cervical spine were also generated. COMPARISON:  MRI 02/07/2019.  CT 02/05/2019 FINDINGS: CT HEAD FINDINGS Brain: Chronic atrophy and small-vessel ischemic change of the white matter. No sign of acute infarction, mass lesion, hemorrhage, obstructive hydrocephalus or extra-axial collection. Ventriculomegaly secondary to central atrophy. Vascular: There is atherosclerotic calcification of the major vessels at the base of the brain. Skull: Negative Sinuses/Orbits: Clear/normal Other: None CT CERVICAL SPINE FINDINGS Alignment: Normal Skull base and vertebrae: No fracture or primary bone lesion. Soft tissues and spinal canal: Negative Disc levels: Degenerative spondylosis from C3-4 through C7-T1 with disc space narrowing and small  osteophytes. Facet osteoarthritis on the right at C3-4 and C4-5 and on the left at C4-5. Mild foraminal narrowing on the right at C3-4 and C4-5. No traumatic finding. Upper chest: Negative Other: None IMPRESSION: Head CT: No acute or traumatic finding. Atrophy and chronic small-vessel ischemic changes. Cervical spine CT: No acute or traumatic finding. Degenerative spondylosis and facet osteoarthritis. Electronically Signed   By: Nelson Chimes M.D.   On: 03/28/2019 15:47   Mr Angio Head Wo Contrast  Result Date: 03/29/2019 CLINICAL DATA:  85 year old female with increasing weakness, small acute right middle frontal gyrus infarct on brain MRI yesterday. History of multiple intracranial stenoses. Status post balloon angioplasty of critical right MCA stenosis on 02/05/2019. EXAM: MRA HEAD WITHOUT CONTRAST MRA NECK WITHOUT CONTRAST TECHNIQUE: Angiographic images of the Circle of Willis were obtained using MRA technique without intravenous contrast. Angiographic images of the neck were obtained using MRA technique without intravenous contrast. Carotid stenosis measurements (when applicable) are obtained utilizing NASCET criteria, using the distal internal carotid diameter as the denominator. COMPARISON:  Brain MRI 03/28/2019. Intracranial MRA 02/07/2019, CTA head and neck 02/05/2019, and earlier. FINDINGS: MRA NECK FINDINGS 3D time-of-flight images demonstrate antegrade flow signal in the bilateral cervical carotid arteries and the cervical left vertebral artery to the skull base. Absent flow signal in the cervical right vertebral artery compatible with known right Vertebral artery occlusion. Mild irregularity at both carotid bifurcations appears stable to that on the 02/05/2019 CTA and does not appear hemodynamically significant. No left vertebral artery stenosis identified in the neck. MRA HEAD FINDINGS Antegrade flow in both ICA siphons appears stable since 02/07/2019, with mild stenosis on the left. Patent  ophthalmic and right posterior communicating artery origins. Patent carotid termini, MCA and ACA origins. A 5-6 millimeter segment of lost flow signal has developed in the proximal right MCA M1 (series 9, image 8), where moderate right MCA irregularity and stenosis was present on the post angioplasty MRA. However, flow is reconstituted at the right MCA bifurcation, and the visible right MCA branches appear to remain patent. Left MCA M1 segment, left MCA trifurcation and left MCA branches appear stable and within normal limits. Chronic occlusion of the left ACA A1. The right A1, anterior communicating artery and visible ACA branches appears stable. Chronic poor flow in the distal right vertebral artery, probably partially reconstituted in a retrograde fashion. Stable antegrade flow signal in the distal left vertebral artery to the vertebrobasilar junction. Stable basilar artery with mild irregularity and mild proximal stenosis. Patent basilar tip, SCA and PCA origins. Right posterior communicating artery is present, the left is diminutive or absent. Bilateral PCA branches appear stable with moderate to severe right P2 segment stenosis. IMPRESSION: 1. Recurrent Critical Stenosis/Near Occlusion of the Right MCA M1 segment which was angioplastied in September. Reconstituted right MCA bifurcation, with no right MCA branch occlusion identified. 2. Otherwise stable intracranial MRA including chronic left A1 occlusion, poor flow in the distal right vertebral artery,  and severe right PCA stenosis. 3. Neck MRA redemonstrates chronic right Vertebral artery occlusion, with no significant left vertebral or cervical carotid artery stenosis. Salient findings discussed by telephone with PA Rufina Falco on 03/29/2019 at 0242 hours. Electronically Signed   By: Genevie Ann M.D.   On: 03/29/2019 02:48   Mr Angio Neck Wo Contrast  Result Date: 03/29/2019 CLINICAL DATA:  85 year old female with increasing weakness, small acute right  middle frontal gyrus infarct on brain MRI yesterday. History of multiple intracranial stenoses. Status post balloon angioplasty of critical right MCA stenosis on 02/05/2019. EXAM: MRA HEAD WITHOUT CONTRAST MRA NECK WITHOUT CONTRAST TECHNIQUE: Angiographic images of the Circle of Willis were obtained using MRA technique without intravenous contrast. Angiographic images of the neck were obtained using MRA technique without intravenous contrast. Carotid stenosis measurements (when applicable) are obtained utilizing NASCET criteria, using the distal internal carotid diameter as the denominator. COMPARISON:  Brain MRI 03/28/2019. Intracranial MRA 02/07/2019, CTA head and neck 02/05/2019, and earlier. FINDINGS: MRA NECK FINDINGS 3D time-of-flight images demonstrate antegrade flow signal in the bilateral cervical carotid arteries and the cervical left vertebral artery to the skull base. Absent flow signal in the cervical right vertebral artery compatible with known right Vertebral artery occlusion. Mild irregularity at both carotid bifurcations appears stable to that on the 02/05/2019 CTA and does not appear hemodynamically significant. No left vertebral artery stenosis identified in the neck. MRA HEAD FINDINGS Antegrade flow in both ICA siphons appears stable since 02/07/2019, with mild stenosis on the left. Patent ophthalmic and right posterior communicating artery origins. Patent carotid termini, MCA and ACA origins. A 5-6 millimeter segment of lost flow signal has developed in the proximal right MCA M1 (series 9, image 8), where moderate right MCA irregularity and stenosis was present on the post angioplasty MRA. However, flow is reconstituted at the right MCA bifurcation, and the visible right MCA branches appear to remain patent. Left MCA M1 segment, left MCA trifurcation and left MCA branches appear stable and within normal limits. Chronic occlusion of the left ACA A1. The right A1, anterior communicating artery and  visible ACA branches appears stable. Chronic poor flow in the distal right vertebral artery, probably partially reconstituted in a retrograde fashion. Stable antegrade flow signal in the distal left vertebral artery to the vertebrobasilar junction. Stable basilar artery with mild irregularity and mild proximal stenosis. Patent basilar tip, SCA and PCA origins. Right posterior communicating artery is present, the left is diminutive or absent. Bilateral PCA branches appear stable with moderate to severe right P2 segment stenosis. IMPRESSION: 1. Recurrent Critical Stenosis/Near Occlusion of the Right MCA M1 segment which was angioplastied in September. Reconstituted right MCA bifurcation, with no right MCA branch occlusion identified. 2. Otherwise stable intracranial MRA including chronic left A1 occlusion, poor flow in the distal right vertebral artery, and severe right PCA stenosis. 3. Neck MRA redemonstrates chronic right Vertebral artery occlusion, with no significant left vertebral or cervical carotid artery stenosis. Salient findings discussed by telephone with PA Rufina Falco on 03/29/2019 at 0242 hours. Electronically Signed   By: Genevie Ann M.D.   On: 03/29/2019 02:48   Mr Brain Wo Contrast  Result Date: 03/28/2019 CLINICAL DATA:  85 year old female with increasing weakness for 2 days. Status post stroke in September. History of multiple severe intracranial stenoses EXAM: MRI HEAD WITHOUT CONTRAST TECHNIQUE: Multiplanar, multiecho pulse sequences of the brain and surrounding structures were obtained without intravenous contrast. COMPARISON:  Brain MRI and intracranial MRA 02/07/2019 and earlier.  FINDINGS: Brain: Expected evolution of the right corona radiata infarct since September. Small new 4-5 millimeter area of right middle frontal gyrus subcortical white matter restricted diffusion on series 9, image 34 and series 22, image 21. T2 and FLAIR hyperintensity with no hemorrhage or mass effect. Developing  cystic encephalomalacia in the right corona radiata. No cortical encephalomalacia identified. Small chronic infarcts in the right PICA territory. Patchy and confluent bilateral periventricular white matter T2 and FLAIR hyperintensity. Chronic microhemorrhages in the right periatrial white matter. Mild superficial siderosis along the left lateral occipital lobe is also stable on series 17, image 24. No midline shift, mass effect, evidence of mass lesion, ventriculomegaly, extra-axial collection or acute intracranial hemorrhage. Cervicomedullary junction and pituitary are within normal limits. Vascular: Major intracranial vascular flow voids are stable, with evidence of decreased flow in the distal right vertebral artery. Skull and upper cervical spine: Negative visible cervical spine. Visualized bone marrow signal is within normal limits. Sinuses/Orbits: Stable and negative. Other: Mastoids remain clear. Scalp and face soft tissues appear negative. IMPRESSION: 1. Small acute lacunar infarct in subcortical white matter of the right middle frontal gyrus. No associated hemorrhage or mass effect. 2. Expected evolution of the right corona radiata infarct since September. Otherwise stable chronic small vessel disease. Mild left occipital lobe superficial siderosis. 3. Chronically abnormal flow in the distal right vertebral artery. Electronically Signed   By: Genevie Ann M.D.   On: 03/28/2019 19:40   Ir US Guide Vasc Access Right  Result Date: 03/31/2019 CLINICAL DATA:  Two episodes of left-sided weakness with tingling resolved. History of a right middle cerebral artery severe stenosis.  EXAM: BILATERAL COMMON CAROTID AND INNOMINATE ANGIOGRAPHY  COMPARISON:  Diagnostic arteriogram of February 05, 2019.  MEDICATIONS: Heparin 2000 units IA. No antibiotic was administered within 1 hour of the procedure.  ANESTHESIA/SEDATION: Versed 1 mg IV; Fentanyl 25 mcg IV  Moderate Sedation Time:  39 minutes  The patient was  continuously monitored during the procedure by the interventional radiology nurse under my direct supervision.  CONTRAST:  Isovue 300 approximately 50 mL.  FLUOROSCOPY TIME:  Fluoroscopy Time: 8 minutes 12 seconds (403 mGy).  COMPLICATIONS: None immediate.  TECHNIQUE: Informed written consent was obtained from the patient after a thorough discussion of the procedural risks, benefits and alternatives. All questions were addressed. Maximal Sterile Barrier Technique was utilized including caps, mask, sterile gowns, sterile gloves, sterile drape, hand hygiene and skin antiseptic. A timeout was performed prior to the initiation of the procedure.  The right forearm to the wrist was then prepped and draped in the usual sterile fashion.  A right radial pulse was then palpated. A dorsal palmar anastomosis was then verified. Ultrasound was utilized to evaluate the morphology of the right radial artery and documented.  Using ultrasound guidance, trans radial access into the right radial artery was obtained using a micropuncture needle. Over a 0.018 inch micro guidewire, a 4/5 French radial sheath was then inserted without difficulty. The obturator and the micro guidewire were removed. Good aspiration obtained from the side port of the sheath. A cocktail of 2000 units of heparin, 2.5 mg of verapamil, and 200 mcg of nitroglycerin was then injected through the sheath in a diluted form without immediate difficulties. A right radial angiogram was obtained.  Over a 0.035 inch Roadrunner guidewire, a 5 [place] Simmons 2 diagnostic catheter was then advanced to the aortic arch region, and selectively positioned in the right common carotid artery, the left common carotid artery and the  right subclavian artery.  Following the procedure hemostasis in the right wrist was obtained using a wrist band. The distal right radial pulse was then verified to be present.  FINDINGS: A right common carotid arteriogram demonstrates the right  external carotid artery and its major branches to be widely patent.  The right internal carotid artery at the bulb has a smooth plaque along the posterolateral wall associated with approximately 20% stenosis by the NASCET criteria. No evidence of ulcerations or of intraluminal filling defects is seen. The vessel is, otherwise, seen to opacify to the cranial skull base. The petrous, cavernous and the supraclinoid segments are widely patent.  The right posterior communicating artery is seen opacifying the right posterior cerebral artery distribution.  The right middle cerebral artery demonstrates a focus of severe high-grade stenosis in the mid M1 segment. More distally the right middle cerebral artery trifurcation branches opacify albeit slowly. The right anterior cerebral artery opacifies into the capillary and venous phases. Prompt cross filling via the anterior communicating artery of the left anterior cerebral A2 segment and distally is seen, and also of the distal A1 segment. The right subclavian arteriogram demonstrates attenuated caliber of the right vertebral artery with a suggestion of severe proximal stenosis. More distally reinforcement of this vessel is seen from the ascending cervical branch of the right thyrocervical trunk with opacification of the right posterior-inferior cerebellar artery which demonstrates significant narrowing at its origin. Also suggested is absent opacification distal to the right vertebrobasilar junction.  The left common carotid arteriogram demonstrates the left external carotid artery and its major branches to be widely patent.  The left internal carotid artery at the bulb to the cranial skull base demonstrates wide patency.  The petrous cavernous junction demonstrates a 50% stenosis as does the caval cavernous segment.  There is mild post stenotic dilatation of the supraclinoid left ICA. Opacification is seen of the left middle cerebral artery which demonstrates  approximately 50% stenosis at its origin. The left MCA distal branches opacify into the capillary and the venous phases.  Faint opacification of the left anterior cerebral A1 segment is seen.  IMPRESSION: Severe high-grade stenosis of the right middle cerebral artery M1 segment compared to the immediate post angioplasty caliber of February 05, 2019.  Severe stenosis of the left anterior cerebral A1 segment. Approximately 50% stenosis of the left internal carotid artery in the petrous cavernous junction, and caval cavernous segment.  Approximately 20% stenosis of the right internal carotid artery at the bulb by the NASCET criteria.  PLAN: Findings were reviewed with the patient and the patient's referring neurologist.  Given the severe recurrent stenosis of the right middle cerebral artery, and patient being symptomatic on antiplatelets, the option of revascularization this time with angioplasty and probable stent placement was discussed with the patient.  Patient is agreeable to this. The procedure will be planned for next Tuesday under general anesthesia.   Electronically Signed   By: [name] M.D.   On: 04/01/2019 10:51   Ir Angio Intra Extracran Sel Com Carotid Innominate Bilat Mod Sed  Result Date: 04/05/2019 CLINICAL DATA:  Two episodes of left-sided weakness with tingling resolved. History of a right middle cerebral artery severe stenosis. EXAM: BILATERAL COMMON CAROTID AND INNOMINATE ANGIOGRAPHY COMPARISON:  Diagnostic arteriogram of February 05, 2019. MEDICATIONS: Heparin 2000 units IA. No antibiotic was administered within 1 hour of the procedure. ANESTHESIA/SEDATION: Versed 1 mg IV; Fentanyl 25 mcg IV Moderate Sedation Time:  39 minutes The patient was continuously monitored  during the procedure by the interventional radiology nurse under my direct supervision. CONTRAST:  Isovue 300 approximately 50 mL. FLUOROSCOPY TIME:  Fluoroscopy Time: 8 minutes 12 seconds (403 mGy).  COMPLICATIONS: None immediate. TECHNIQUE: Informed written consent was obtained from the patient after a thorough discussion of the procedural risks, benefits and alternatives. All questions were addressed. Maximal Sterile Barrier Technique was utilized including caps, mask, sterile gowns, sterile gloves, sterile drape, hand hygiene and skin antiseptic. A timeout was performed prior to the initiation of the procedure. The right forearm to the wrist was then prepped and draped in the usual sterile fashion. A right radial pulse was then palpated. A dorsal palmar anastomosis was then verified. Ultrasound was utilized to evaluate the morphology of the right radial artery and documented. Using ultrasound guidance, trans radial access into the right radial artery was obtained using a micropuncture needle. Over a 0.018 inch micro guidewire, a 4/5 French radial sheath was then inserted without difficulty. The obturator and the micro guidewire were removed. Good aspiration obtained from the side port of the sheath. A cocktail of 2000 units of heparin, 2.5 mg of verapamil, and 200 mcg of nitroglycerin was then injected through the sheath in a diluted form without immediate difficulties. A right radial angiogram was obtained. Over a 0.035 inch Roadrunner guidewire, a 5 [place] Simmons 2 diagnostic catheter was then advanced to the aortic arch region, and selectively positioned in the right common carotid artery, the left common carotid artery and the right subclavian artery. Following the procedure hemostasis in the right wrist was obtained using a wrist band. The distal right radial pulse was then verified to be present. FINDINGS: A right common carotid arteriogram demonstrates the right external carotid artery and its major branches to be widely patent. The right internal carotid artery at the bulb has a smooth plaque along the posterolateral wall associated with approximately 20% stenosis by the NASCET criteria. No evidence  of ulcerations or of intraluminal filling defects is seen. The vessel is, otherwise, seen to opacify to the cranial skull base. The petrous, cavernous and the supraclinoid segments are widely patent. The right posterior communicating artery is seen opacifying the right posterior cerebral artery distribution. The right middle cerebral artery demonstrates a focus of severe high-grade stenosis in the mid M1 segment. More distally the right middle cerebral artery trifurcation branches opacify albeit slowly. The right anterior cerebral artery opacifies into the capillary and venous phases. Prompt cross filling via the anterior communicating artery of the left anterior cerebral A2 segment and distally is seen, and also of the distal A1 segment. The right subclavian arteriogram demonstrates attenuated caliber of the right vertebral artery with a suggestion of severe proximal stenosis. More distally reinforcement of this vessel is seen from the ascending cervical branch of the right thyrocervical trunk with opacification of the right posterior-inferior cerebellar artery which demonstrates significant narrowing at its origin. Also suggested is absent opacification distal to the right vertebrobasilar junction. The left common carotid arteriogram demonstrates the left external carotid artery and its major branches to be widely patent. The left internal carotid artery at the bulb to the cranial skull base demonstrates wide patency. The petrous cavernous junction demonstrates a 50% stenosis as does the caval cavernous segment. There is mild post stenotic dilatation of the supraclinoid left ICA. Opacification is seen of the left middle cerebral artery which demonstrates approximately 50% stenosis at its origin. The left MCA distal branches opacify into the capillary and the venous phases. Faint opacification of  the left anterior cerebral A1 segment is seen. IMPRESSION: Severe high-grade stenosis of the right middle cerebral  artery M1 segment compared to the immediate post angioplasty caliber of February 05, 2019. Severe stenosis of the left anterior cerebral A1 segment. Approximately 50% stenosis of the left internal carotid artery in the petrous cavernous junction, and caval cavernous segment. Approximately 20% stenosis of the right internal carotid artery at the bulb by the NASCET criteria. PLAN: Findings were reviewed with the patient and the patient's referring neurologist. Given the severe recurrent stenosis of the right middle cerebral artery, and patient being symptomatic on antiplatelets, the option of revascularization this time with angioplasty and probable stent placement was discussed with the patient. Patient is agreeable to this. The procedure will be planned for next Tuesday under general anesthesia. Electronically Signed   By: [name] M.D.   On: 04/01/2019 10:51   Ir Angio Vertebral Sel Subclavian Innominate Uni R Mod Sed  Result Date: 04/06/2019 CLINICAL DATA: Two episodes of left-sided weakness with tingling resolved. History of a right middle cerebral artery severe stenosis.  EXAM: BILATERAL COMMON CAROTID AND INNOMINATE ANGIOGRAPHY  COMPARISON: Diagnostic arteriogram of February 05, 2019.  MEDICATIONS: Heparin 2000 units IA. No antibiotic was administered within 1 hour of the procedure.  ANESTHESIA/SEDATION: Versed 1 mg IV; Fentanyl 25 mcg IV  Moderate Sedation Time: 39 minutes  The patient was continuously monitored during the procedure by the interventional radiology nurse under my direct supervision.  CONTRAST: Isovue 300 approximately 50 mL.  FLUOROSCOPY TIME: Fluoroscopy Time: 8 minutes 12 seconds (403 mGy).  COMPLICATIONS: None immediate.  TECHNIQUE: Informed written consent was obtained from the patient after a thorough discussion of the procedural risks, benefits and alternatives. All questions were addressed. Maximal Sterile Barrier Technique was utilized including caps,  mask, sterile gowns, sterile gloves, sterile drape, hand hygiene and skin antiseptic. A timeout was performed prior to the initiation of the procedure.  The right forearm to the wrist was then prepped and draped in the usual sterile fashion.  A right radial pulse was then palpated. A dorsal palmar anastomosis was then verified. Ultrasound was utilized to evaluate the morphology of the right radial artery and documented.  Using ultrasound guidance, trans radial access into the right radial artery was obtained using a micropuncture needle. Over a 0.018 inch micro guidewire, a 4/5 French radial sheath was then inserted without difficulty. The obturator and the micro guidewire were removed. Good aspiration obtained from the side port of the sheath. A cocktail of 2000 units of heparin, 2.5 mg of verapamil, and 200 mcg of nitroglycerin was then injected through the sheath in a diluted form without immediate difficulties. A right radial angiogram was obtained.  Over a 0.035 inch Roadrunner guidewire, a 5 [place] Simmons 2 diagnostic catheter was then advanced to the aortic arch region, and selectively positioned in the right common carotid artery, the left common carotid artery and the right subclavian artery.  Following the procedure hemostasis in the right wrist was obtained using a wrist band. The distal right radial pulse was then verified to be present.  FINDINGS: A right common carotid arteriogram demonstrates the right external carotid artery and its major branches to be widely patent.  The right internal carotid artery at the bulb has a smooth plaque along the posterolateral wall associated with approximately 20% stenosis by the NASCET criteria. No evidence of ulcerations or of intraluminal filling defects is seen. The vessel is, otherwise, seen to opacify to the cranial skull  base. The petrous, cavernous and the supraclinoid segments are widely patent.  The right posterior communicating artery is seen  opacifying the right posterior cerebral artery distribution.  The right middle cerebral artery demonstrates a focus of severe high-grade stenosis in the mid M1 segment. More distally the right middle cerebral artery trifurcation branches opacify albeit slowly. The right anterior cerebral artery opacifies into the capillary and venous phases. Prompt cross filling via the anterior communicating artery of the left anterior cerebral A2 segment and distally is seen, and also of the distal A1 segment. The right subclavian arteriogram demonstrates attenuated caliber of the right vertebral artery with a suggestion of severe proximal stenosis. More distally reinforcement of this vessel is seen from the ascending cervical branch of the right thyrocervical trunk with opacification of the right posterior-inferior cerebellar artery which demonstrates significant narrowing at its origin. Also suggested is absent opacification distal to the right vertebrobasilar junction.  The left common carotid arteriogram demonstrates the left external carotid artery and its major branches to be widely patent.  The left internal carotid artery at the bulb to the cranial skull base demonstrates wide patency.  The petrous cavernous junction demonstrates a 50% stenosis as does the caval cavernous segment.  There is mild post stenotic dilatation of the supraclinoid left ICA. Opacification is seen of the left middle cerebral artery which demonstrates approximately 50% stenosis at its origin. The left MCA distal branches opacify into the capillary and the venous phases.  Faint opacification of the left anterior cerebral A1 segment is seen.  IMPRESSION: Severe high-grade stenosis of the right middle cerebral artery M1 segment compared to the immediate post angioplasty caliber of February 05, 2019.  Severe stenosis of the left anterior cerebral A1 segment. Approximately 50% stenosis of the left internal carotid artery in the petrous cavernous  junction, and caval cavernous segment.  Approximately 20% stenosis of the right internal carotid artery at the bulb by the NASCET criteria.  PLAN: Findings were reviewed with the patient and the patient's referring neurologist.  Given the severe recurrent stenosis of the right middle cerebral artery, and patient being symptomatic on antiplatelets, the option of revascularization this time with angioplasty and probable stent placement was discussed with the patient.  Patient is agreeable to this. The procedure will be planned for next Tuesday under general anesthesia.   Electronically Signed By: [name] M.D. On: 04/01/2019 10:51        Subjective: Patient was seen and examined, she feels much better,  Denies any overnight events.   Discharge Exam: Vitals:   04/11/19 0457 04/11/19 0749  BP: (!) 155/64 (!) 156/78  Pulse: 83 82  Resp: 18 18  Temp: 99.1 F (37.3 C) 98.8 F (37.1 C)  SpO2: 95% 95%   Vitals:   04/10/19 2139 04/10/19 2324 04/11/19 0457 04/11/19 0749  BP: (!) 149/53 (!) 136/51 (!) 155/64 (!) 156/78  Pulse: 83 80 83 82  Resp:  18 18 18   Temp:  98.4 F (36.9 C) 99.1 F (37.3 C) 98.8 F (37.1 C)  TempSrc:  Oral Oral Oral  SpO2: 95% 96% 95% 95%  Weight:      Height:        General: Pt is alert, awake, not in acute distress Cardiovascular: RRR, S1/S2 +, no rubs, no gallops Respiratory: CTA bilaterally, no wheezing, no rhonchi Abdominal: Soft, NT, ND, bowel sounds + Extremities: no edema, no cyanosis    The results of significant diagnostics from this hospitalization (including imaging, microbiology, ancillary and  laboratory) are listed below for reference.     Microbiology: Recent Results (from the past 240 hour(s))  Surgical PCR screen     Status: Abnormal   Collection Time: 04/04/19  8:24 PM   Specimen: Nasal Mucosa; Nasal Swab  Result Value Ref Range Status   MRSA, PCR POSITIVE (A) NEGATIVE Final   Staphylococcus aureus POSITIVE (A)  NEGATIVE Final    Comment: RESULT CALLED TO, READ BACK BY AND VERIFIED WITH: JS RN 04/04/19 2220 JDW (NOTE) The Xpert SA Assay (FDA approved for NASAL specimens in patients 36 years of age and older), is one component of a comprehensive surveillance program. It is not intended to diagnose infection nor to guide or monitor treatment. Performed at [facility], [place] [address] [place] [address]   MRSA PCR Screening     Status: None   Collection Time: 04/06/19  1:48 PM   Specimen: Nasal Mucosa; Nasopharyngeal  Result Value Ref Range Status   MRSA by PCR NEGATIVE NEGATIVE Final    Comment:        The GeneXpert MRSA Assay (FDA approved for NASAL specimens only), is one component of a comprehensive MRSA colonization surveillance program. It is not intended to diagnose MRSA infection nor to guide or monitor treatment for MRSA infections. Performed at [facility], [place] [address] [place], [place] [address]   SARS CORONAVIRUS 2 (TAT 6-24 HRS) Nasopharyngeal Nasopharyngeal Swab     Status: None   Collection Time: 04/09/19  3:19 PM   Specimen: Nasopharyngeal Swab  Result Value Ref Range Status   SARS Coronavirus 2 NEGATIVE NEGATIVE Final    Comment: (NOTE) SARS-CoV-2 target nucleic acids are NOT DETECTED. The SARS-CoV-2 RNA is generally detectable in upper and lower respiratory specimens during the acute phase of infection. Negative results do not preclude SARS-CoV-2 infection, do not rule out co-infections with other pathogens, and should not be used as the sole basis for treatment or other patient management decisions. Negative results must be combined with clinical observations, patient history, and epidemiological information. The expected result is Negative. Fact Sheet for Patients: SugarRoll.be Fact Sheet for Healthcare Providers: https://www.woods-mathews.com/ This test is not yet approved or cleared by the  [place] FDA and  has been authorized for detection and/or diagnosis of SARS-CoV-2 by FDA under an Emergency Use Authorization (EUA). This EUA will remain  in effect (meaning this test can be used) for the duration of the COVID-19 declaration under Section 56 4(b)(1) of the Act, 21 U.S.C. section 360bbb-3(b)(1), unless the authorization is terminated or revoked sooner. Performed at [facility], [place] [address], [place], [place] [address]      Labs: BNP (last 3 results) Recent Labs    02/03/19 0942 02/04/19 0401  BNP 314.0* [id number]   Basic Metabolic Panel: Recent Labs  Lab 04/07/19 0544  NA 140  K 3.7  CL 109  CO2 20*  GLUCOSE 117*  BUN 20  CREATININE 0.90  CALCIUM 8.2*   Liver Function Tests: No results for input(s): AST, ALT, ALKPHOS, BILITOT, PROT, ALBUMIN in the last 168 hours. No results for input(s): LIPASE, AMYLASE in the last 168 hours. No results for input(s): AMMONIA in the last 168 hours. CBC: Recent Labs  Lab 04/05/19 0307 04/07/19 0544  WBC 6.0 8.8  NEUTROABS 3.4 7.1  HGB 12.6 11.0*  HCT 39.2 35.0*  MCV 92.5 95.6  PLT 276 293   Cardiac Enzymes: No results for input(s): CKTOTAL, CKMB, CKMBINDEX, TROPONINI in  the last 168 hours. BNP: Invalid input(s): POCBNP CBG: No results for input(s): GLUCAP in the last 168 hours. D-Dimer No results for input(s): DDIMER in the last 72 hours. Hgb A1c No results for input(s): HGBA1C in the last 72 hours. Lipid Profile No results for input(s): CHOL, HDL, LDLCALC, TRIG, CHOLHDL, LDLDIRECT in the last 72 hours. Thyroid function studies No results for input(s): TSH, T4TOTAL, T3FREE, THYROIDAB in the last 72 hours.  Invalid input(s): FREET3 Anemia work up No results for input(s): VITAMINB12, FOLATE, FERRITIN, TIBC, IRON, RETICCTPCT in the last 72 hours. Urinalysis    Component Value Date/Time   COLORURINE YELLOW 04/05/2019 0935   APPEARANCEUR HAZY (A) 04/05/2019 0935   APPEARANCEUR Cloudy (A)  01/12/2019 0911   LABSPEC 1.012 04/05/2019 0935   LABSPEC 1.018 11/22/2013 1637   PHURINE 6.0 04/05/2019 0935   GLUCOSEU NEGATIVE 04/05/2019 0935   GLUCOSEU Negative 11/22/2013 1637   HGBUR NEGATIVE 04/05/2019 0935   BILIRUBINUR NEGATIVE 04/05/2019 0935   BILIRUBINUR negative 02/02/2019 1033   BILIRUBINUR Negative 01/12/2019 0911   BILIRUBINUR Negative 11/22/2013 1637   KETONESUR NEGATIVE 04/05/2019 0935   PROTEINUR NEGATIVE 04/05/2019 0935   UROBILINOGEN 0.2 02/02/2019 1033   NITRITE NEGATIVE 04/05/2019 0935   LEUKOCYTESUR NEGATIVE 04/05/2019 0935   LEUKOCYTESUR Negative 11/22/2013 1637   Sepsis Labs Invalid input(s): PROCALCITONIN,  WBC,  LACTICIDVEN Microbiology Recent Results (from the past 240 hour(s))  Surgical PCR screen     Status: Abnormal   Collection Time: 04/04/19  8:24 PM   Specimen: Nasal Mucosa; Nasal Swab  Result Value Ref Range Status   MRSA, PCR POSITIVE (A) NEGATIVE Final   Staphylococcus aureus POSITIVE (A) NEGATIVE Final    Comment: RESULT CALLED TO, READ BACK BY AND VERIFIED WITH: JS RN 04/04/19 2220 JDW (NOTE) The Xpert SA Assay (FDA approved for NASAL specimens in patients 62 years of age and older), is one component of a comprehensive surveillance program. It is not intended to diagnose infection nor to guide or monitor treatment. Performed at [facility], [place] [address] [place], [place] [address]   MRSA PCR Screening     Status: None   Collection Time: 04/06/19  1:48 PM   Specimen: Nasal Mucosa; Nasopharyngeal  Result Value Ref Range Status   MRSA by PCR NEGATIVE NEGATIVE Final    Comment:        The GeneXpert MRSA Assay (FDA approved for NASAL specimens only), is one component of a comprehensive MRSA colonization surveillance program. It is not intended to diagnose MRSA infection nor to guide or monitor treatment for MRSA infections. Performed at [facility], [place] [address], [place], [place] [address]   SARS CORONAVIRUS  2 (TAT 6-24 HRS) Nasopharyngeal Nasopharyngeal Swab     Status: None   Collection Time: 04/09/19  3:19 PM   Specimen: Nasopharyngeal Swab  Result Value Ref Range Status   SARS Coronavirus 2 NEGATIVE NEGATIVE Final    Comment: (NOTE) SARS-CoV-2 target nucleic acids are NOT DETECTED. The SARS-CoV-2 RNA is generally detectable in upper and lower respiratory specimens during the acute phase of infection. Negative results do not preclude SARS-CoV-2 infection, do not rule out co-infections with other pathogens, and should not be used as the sole basis for treatment or other patient management decisions. Negative results must be combined with clinical observations, patient history, and epidemiological information. The expected result is Negative. Fact Sheet for Patients: SugarRoll.be Fact Sheet for Healthcare Providers: https://www.woods-mathews.com/ This test is not yet approved or cleared  by the [place] and  has been authorized for detection and/or diagnosis of SARS-CoV-2 by FDA under an Emergency Use Authorization (EUA). This EUA will remain  in effect (meaning this test can be used) for the duration of the COVID-19 declaration under Section 56 4(b)(1) of the Act, 21 U.S.C. section 360bbb-3(b)(1), unless the authorization is terminated or revoked sooner. Performed at [facility], [place] [address] [place], [place] [address]      Time coordinating discharge: Over 30 minutes  SIGNED:   Shawna Clamp, MD  [facility] 04/11/2019, 10:06 AM Pager   If 7PM-7AM, please contact night-coverage [email or web address]

## 2019-04-11 NOTE — Plan of Care (Signed)
Max assist adls 

## 2019-04-11 NOTE — Discharge Instructions (Signed)
Follow up PCP in one week. Follow up Neurology in 6 weeks. Will request PCP to check cbc and BMP.. Continue aspirin, Brilanta and statins.

## 2019-04-11 NOTE — TOC Transition Note (Signed)
Transition of Care [facility]) - CM/SW Discharge Note   Patient Details  Name: Christy Carter MRN: [id number] Date of Birth: 1933/11/28  [facility] CM/SW Contact:  Pollie Friar, RN Phone Number: 04/11/2019, 12:16 PM   Clinical Narrative:    Pt is discharging to [facility] today. Daughter is at the bedside and aware of plan. Pt to transport via [facility]. D/c packet at the desk and bedside RN updated.   Room: 806 Number for report: [phone]   Final next level of care: Skilled Nursing Facility Barriers to Discharge: No Barriers Identified   Patient Goals and CMS Choice Patient states their goals for this hospitalization and ongoing recovery are:: Patient unable to participate in goal setting due to confusion. CMS Medicare.gov Compare Post Acute Care list provided to:: Patient Represenative (must comment) Choice offered to / list presented to : Adult Children  Discharge Placement              Patient chooses bed at: [facility] Patient to be transferred to facility by: [facility] Name of family member notified: Daughter is at the bedside Patient and family notified of of transfer: 04/11/19  Discharge Plan and Services     Post Acute Care Choice: [facility]          DME Arranged: Air overlay mattress                    Social Determinants of Health (SDOH) Interventions     Readmission Risk Interventions No flowsheet data found.

## 2019-04-12 ENCOUNTER — Encounter (HOSPITAL_COMMUNITY): Payer: Self-pay | Admitting: Interventional Radiology

## 2019-04-15 DIAGNOSIS — Z7902 Long term (current) use of antithrombotics/antiplatelets: Secondary | ICD-10-CM | POA: Diagnosis not present

## 2019-04-15 DIAGNOSIS — I69354 Hemiplegia and hemiparesis following cerebral infarction affecting left non-dominant side: Secondary | ICD-10-CM | POA: Diagnosis not present

## 2019-04-15 DIAGNOSIS — I10 Essential (primary) hypertension: Secondary | ICD-10-CM | POA: Diagnosis not present

## 2019-04-15 DIAGNOSIS — R2689 Other abnormalities of gait and mobility: Secondary | ICD-10-CM | POA: Diagnosis not present

## 2019-04-15 DIAGNOSIS — Z7982 Long term (current) use of aspirin: Secondary | ICD-10-CM | POA: Diagnosis not present

## 2019-04-22 ENCOUNTER — Ambulatory Visit: Payer: Medicare Other | Attending: Internal Medicine

## 2019-04-23 ENCOUNTER — Emergency Department: Payer: Medicare Other

## 2019-04-23 ENCOUNTER — Encounter: Payer: Self-pay | Admitting: Emergency Medicine

## 2019-04-23 ENCOUNTER — Other Ambulatory Visit: Payer: Self-pay

## 2019-04-23 ENCOUNTER — Emergency Department
Admission: EM | Admit: 2019-04-23 | Discharge: 2019-04-23 | Disposition: A | Discharge status: Home / Self Care | Payer: Medicare Other | Attending: Emergency Medicine | Admitting: Emergency Medicine

## 2019-04-23 DIAGNOSIS — E039 Hypothyroidism, unspecified: Secondary | ICD-10-CM | POA: Insufficient documentation

## 2019-04-23 DIAGNOSIS — Z79899 Other long term (current) drug therapy: Secondary | ICD-10-CM | POA: Diagnosis not present

## 2019-04-23 DIAGNOSIS — I129 Hypertensive chronic kidney disease with stage 1 through stage 4 chronic kidney disease, or unspecified chronic kidney disease: Secondary | ICD-10-CM | POA: Insufficient documentation

## 2019-04-23 DIAGNOSIS — S199XXA Unspecified injury of neck, initial encounter: Secondary | ICD-10-CM | POA: Diagnosis not present

## 2019-04-23 DIAGNOSIS — Z7982 Long term (current) use of aspirin: Secondary | ICD-10-CM | POA: Diagnosis not present

## 2019-04-23 DIAGNOSIS — S0101XA Laceration without foreign body of scalp, initial encounter: Secondary | ICD-10-CM | POA: Diagnosis not present

## 2019-04-23 DIAGNOSIS — N183 Chronic kidney disease, stage 3 unspecified: Secondary | ICD-10-CM | POA: Insufficient documentation

## 2019-04-23 DIAGNOSIS — Z8673 Personal history of transient ischemic attack (TIA), and cerebral infarction without residual deficits: Secondary | ICD-10-CM | POA: Insufficient documentation

## 2019-04-23 DIAGNOSIS — Z87891 Personal history of nicotine dependence: Secondary | ICD-10-CM | POA: Insufficient documentation

## 2019-04-23 DIAGNOSIS — S0990XA Unspecified injury of head, initial encounter: Secondary | ICD-10-CM | POA: Diagnosis not present

## 2019-04-23 DIAGNOSIS — I10 Essential (primary) hypertension: Secondary | ICD-10-CM | POA: Diagnosis not present

## 2019-04-23 DIAGNOSIS — S01112A Laceration without foreign body of left eyelid and periocular area, initial encounter: Secondary | ICD-10-CM | POA: Insufficient documentation

## 2019-04-23 DIAGNOSIS — F0390 Unspecified dementia without behavioral disturbance: Secondary | ICD-10-CM | POA: Insufficient documentation

## 2019-04-23 DIAGNOSIS — Y999 Unspecified external cause status: Secondary | ICD-10-CM | POA: Insufficient documentation

## 2019-04-23 DIAGNOSIS — W19XXXA Unspecified fall, initial encounter: Secondary | ICD-10-CM | POA: Insufficient documentation

## 2019-04-23 DIAGNOSIS — N390 Urinary tract infection, site not specified: Secondary | ICD-10-CM | POA: Insufficient documentation

## 2019-04-23 DIAGNOSIS — Y939 Activity, unspecified: Secondary | ICD-10-CM | POA: Insufficient documentation

## 2019-04-23 DIAGNOSIS — Y92129 Unspecified place in nursing home as the place of occurrence of the external cause: Secondary | ICD-10-CM | POA: Insufficient documentation

## 2019-04-23 LAB — CBC WITH DIFFERENTIAL/PLATELET
Abs Immature Granulocytes: 0.1 10*3/uL — ABNORMAL HIGH (ref 0.00–0.07)
Basophils Absolute: 0.1 10*3/uL (ref 0.0–0.1)
Basophils Relative: 1 %
Eosinophils Absolute: 0.4 10*3/uL (ref 0.0–0.5)
Eosinophils Relative: 3 %
HCT: 41.7 % (ref 36.0–46.0)
Hemoglobin: 13.5 g/dL (ref 12.0–15.0)
Immature Granulocytes: 1 %
Lymphocytes Relative: 8 %
Lymphs Abs: 1 10*3/uL (ref 0.7–4.0)
MCH: 29.2 pg (ref 26.0–34.0)
MCHC: 32.4 g/dL (ref 30.0–36.0)
MCV: 90.1 fL (ref 80.0–100.0)
Monocytes Absolute: 1 10*3/uL (ref 0.1–1.0)
Monocytes Relative: 8 %
Neutro Abs: 10 10*3/uL — ABNORMAL HIGH (ref 1.7–7.7)
Neutrophils Relative %: 79 %
Platelets: 441 10*3/uL — ABNORMAL HIGH (ref 150–400)
RBC: 4.63 MIL/uL (ref 3.87–5.11)
RDW: 15.4 % (ref 11.5–15.5)
WBC: 12.5 10*3/uL — ABNORMAL HIGH (ref 4.0–10.5)
nRBC: 0 % (ref 0.0–0.2)

## 2019-04-23 LAB — URINALYSIS, COMPLETE (UACMP) WITH MICROSCOPIC
Bilirubin Urine: NEGATIVE
Glucose, UA: NEGATIVE mg/dL
Hgb urine dipstick: NEGATIVE
Ketones, ur: NEGATIVE mg/dL
Nitrite: NEGATIVE
Protein, ur: >=300 mg/dL — AB
RBC / HPF: >50 RBC/hpf — ABNORMAL HIGH (ref 0–5)
Specific Gravity, Urine: 1.015 (ref 1.005–1.030)
WBC, UA: >50 WBC/hpf — ABNORMAL HIGH (ref 0–5)
pH: 8 (ref 5.0–8.0)

## 2019-04-23 LAB — BASIC METABOLIC PANEL
Anion gap: 11 (ref 5–15)
BUN: 43 mg/dL — ABNORMAL HIGH (ref 8–23)
CO2: 25 mmol/L (ref 22–32)
Calcium: 9.3 mg/dL (ref 8.9–10.3)
Chloride: 108 mmol/L (ref 98–111)
Creatinine, Ser: 1.2 mg/dL — ABNORMAL HIGH (ref 0.44–1.00)
GFR calc Af Amer: 48 mL/min — ABNORMAL LOW (ref >60)
GFR calc non Af Amer: 42 mL/min — ABNORMAL LOW (ref >60)
Glucose, Bld: 125 mg/dL — ABNORMAL HIGH (ref 70–99)
Potassium: 3.9 mmol/L (ref 3.5–5.1)
Sodium: 144 mmol/L (ref 135–145)

## 2019-04-23 MED ORDER — SODIUM CHLORIDE 0.9 % IV BOLUS
500.0000 mL | Freq: Once | INTRAVENOUS | Status: AC
  Administered 2019-04-23: 22:00:00 500 mL via INTRAVENOUS

## 2019-04-23 MED ORDER — CEPHALEXIN 500 MG PO CAPS: 500.0000 mg | ORAL_CAPSULE | Freq: Four times a day (QID) | ORAL | 0 refills | Status: AC

## 2019-04-23 MED ORDER — LIDOCAINE HCL (PF) 1 % IJ SOLN
5.0000 mL | Freq: Once | INTRAMUSCULAR | Status: AC
  Administered 2019-04-23: 5 mL
  Filled 2019-04-23: qty 5

## 2019-04-23 MED ORDER — SODIUM CHLORIDE 0.9 % IV SOLN
1.0000 g | Freq: Once | INTRAVENOUS | Status: AC
  Administered 2019-04-23: 1 g via INTRAVENOUS
  Filled 2019-04-23: qty 10

## 2019-04-23 NOTE — ED Provider Notes (Signed)
[facility] Emergency Department Provider Note   ____________________________________________   I have reviewed the triage vital signs and the nursing notes.   HISTORY  Chief Complaint Fall   History limited by and level 5 caveat due to: Dementia   HPI Christy Carter is a 85 y.o. female who presents to the emergency department today after suffering a fall. The patient denies passing out but cannot say exactly how she fell. She was apparently found on the ground. She denies any pain. Denies any recent illness or fever.   Records reviewed. Per medical record review patient has a history of CVA,   Past Medical History:  Diagnosis Date  . Arthritis   . Carpal tunnel syndrome   . Complication of anesthesia    Difficulty waking up. Believes it took 2 days to wake up after her surgery.  . Depression   . Hyperlipidemia   . Hypertension   . Shortness of breath   . Stroke ([place])   . Thyroid disease     Patient Active Problem List   Diagnosis Date Noted  . CVA (cerebral vascular accident) ([place]) 03/29/2019  . Ischemic stroke ([place]) 03/29/2019  . Acute CVA (cerebrovascular accident) ([employer]) 03/28/2019  . Sleep disturbance   . Benign essential HTN   . Acute lower UTI   . Pseudodementia   . Labile blood pressure   . Metabolic encephalopathy   . Hyperglycemia   . Encephalopathy   . AKI (acute kidney injury) ([place])   . Leukocytosis   . Dysphagia, post-stroke   . Acute bilat watershed infarction [facility]) 02/11/2019  . Agitation requiring sedation protocol 02/09/2019  . Dysphagia 02/09/2019  . Stroke (cerebrum) ([place]) - R watershed d/t large vessel dz & small L temp lobe & R cerebellar infarcts 02/05/2019  . Middle cerebral artery stenosis, right 02/05/2019  . Acute respiratory failure ([place]) 02/03/2019  . Acute respiratory failure with hypoxemia ([place]) 02/03/2019  . Abnormal renal function 01/19/2019  . Pain in left foot 12/19/2018  . Hematuria 08/02/2018  .  Influenza A 03/02/2018  . Urinary tract infection without hematuria 03/02/2018  . Sepsis ([place]) 02/11/2018  . CAP (community acquired pneumonia) 02/11/2018  . CKD (chronic kidney disease), stage III 02/11/2018  . Memory loss, short term 02/10/2018  . Flu vaccine need 02/10/2018  . Chest pain 02/03/2018  . Acute upper respiratory infection 02/03/2018  . Gastroesophageal reflux disease without esophagitis 02/03/2018  . Encounter for general adult medical examination with abnormal findings 01/13/2018  . Chronic low back pain 01/13/2018  . Need for vaccination against Streptococcus pneumoniae using pneumococcal conjugate vaccine 7 01/13/2018  . Dysuria 01/13/2018  . 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 Pelvic pain 10/08/2017  . Gout 06/15/2017  . Mixed hyperlipidemia 06/15/2017  . Inflammatory polyarthropathy ([place]) 06/15/2017  . Neuralgia and neuritis, unspecified 06/15/2017  . Major depressive disorder, recurrent, mild ([place]) 06/15/2017  . Other vitamin B12 deficiency anemias 06/15/2017  . Hypothyroidism 10/17/2008  . OVERWEIGHT/OBESITY 10/17/2008  . SLEEP APNEA, OBSTRUCTIVE 10/17/2008  . Essential hypertension 10/17/2008  . GALLSTONES 10/17/2008  . DYSPNEA 10/17/2008  . Obesity 10/17/2008    Past Surgical History:  Procedure Laterality Date  . APPENDECTOMY    . BACK SURGERY    . CHOLECYSTECTOMY    . HERNIA REPAIR    . IR ANGIO INTRA EXTRACRAN SEL COM CAROTID INNOMINATE BILAT MOD SED  03/31/2019  . IR ANGIO VERTEBRAL SEL SUBCLAVIAN INNOMINATE UNI R MOD SED  03/31/2019  .  IR CT HEAD LTD  02/05/2019  . IR INTRA CRAN STENT  04/06/2019  . IR PTA INTRACRANIAL  02/05/2019  . IR US GUIDE VASC ACCESS RIGHT  03/31/2019  . RADIOLOGY WITH ANESTHESIA N/A 02/05/2019   Procedure: CODE STROKE;  Surgeon: Radiologist, Medication, MD;  Location: [place];  Service: Radiology;  Laterality: N/A;  . RADIOLOGY WITH ANESTHESIA N/A 04/06/2019   Procedure: STENTING;  Surgeon:  [name], MD;  Location: [place];  Service: Radiology;  Laterality: N/A;    Prior to Admission medications   Medication Sig Start Date End Date Taking? Authorizing Provider  acetaminophen (TYLENOL) 325 MG tablet Take 2 tablets (650 mg total) by mouth every 4 (four) hours as needed for mild pain (or temp > 37.5 C (99.5 F)). 03/08/19   Angiulli, Lavon Paganini, PA-C  allopurinol (ZYLOPRIM) 100 MG tablet Take 1 tablet (100 mg total) by mouth daily. 04/11/19   Shawna Clamp, MD  amLODipine (NORVASC) 10 MG tablet Take 1 tablet (10 mg total) by mouth daily. 04/11/19   Shawna Clamp, MD  aspirin EC 81 MG EC tablet Take 1 tablet (81 mg total) by mouth daily. 02/11/19   Donzetta Starch, NP  atorvastatin (LIPITOR) 40 MG tablet Take 1 tablet (40 mg total) by mouth daily at 6 PM. 03/08/19   Angiulli, Lavon Paganini, PA-C  bisoprolol (ZEBETA) 5 MG tablet Take 0.5 tablets (2.5 mg total) by mouth daily. 03/08/19   Angiulli, Lavon Paganini, PA-C  cloNIDine (CATAPRES - DOSED IN MG/24 HR) 0.1 mg/24hr patch Place 1 patch (0.1 mg total) onto the skin every Monday. 03/14/19   Angiulli, Lavon Paganini, PA-C  Cyanocobalamin (B-12) 2500 MCG TABS Take 2,500 mcg by mouth daily as needed (For Lethargy).     [provider]  diclofenac sodium (VOLTAREN) 1 % GEL Apply 4 g topically 4 (four) times daily. Patient taking differently: Apply 4 g topically 4 (four) times daily as needed (For pain).  03/08/19   Angiulli, Lavon Paganini, PA-C  FLUoxetine (PROZAC) 10 MG capsule Take 1 capsule (10 mg total) by mouth daily. 03/08/19   Angiulli, Lavon Paganini, PA-C  levothyroxine (SYNTHROID) 150 MCG tablet Take 1 tablet (150 mcg total) by mouth daily before breakfast. 03/08/19   Angiulli, Lavon Paganini, PA-C  omeprazole (PRILOSEC) 40 MG capsule Take 1 capsule (40 mg total) by mouth daily. 03/08/19   Angiulli, Lavon Paganini, PA-C  QUEtiapine (SEROQUEL) 25 MG tablet Take 1 tablet (25 mg total) by mouth at bedtime. 03/08/19   Angiulli, Lavon Paganini, PA-C  ticagrelor  (BRILINTA) 90 MG TABS tablet Take 1 tablet (90 mg total) by mouth 2 (two) times daily. 03/08/19   Angiulli, Lavon Paganini, PA-C  VENTOLIN HFA 108 (90 Base) MCG/ACT inhaler TAKE 2 PUFFS BY MOUTH EVERY 6 HOURS AS NEEDED FOR WHEEZE OR SHORTNESS OF BREATH Patient taking differently: Inhale 2 puffs into the lungs every 6 (six) hours as needed for shortness of breath.  03/19/18   Ronnell Freshwater, NP  Vitamin D, Ergocalciferol, (DRISDOL) 1.25 MG (50000 UT) CAPS capsule Take 1 capsule (50,000 Units total) by mouth every 7 (seven) days. 02/02/19   Kendell Bane, NP    Allergies Patient has no known allergies.  Family History  Problem Relation Age of Onset  . Heart disease Mother   . Hypertension Mother   . Diverticulitis Mother   . Heart disease Father   . Hypertension Father     Social History Social History   Tobacco Use  . Smoking  status: Former Smoker    Types: Cigarettes    Quit date: 08/15/1992    Years since quitting: 26.7  . Smokeless tobacco: Never Used  Substance Use Topics  . Alcohol use: No  . Drug use: No    Review of Systems Constitutional: No fever/chills Eyes: No visual changes. ENT: No sore throat. Cardiovascular: Denies chest pain. Respiratory: Denies shortness of breath. Gastrointestinal: No abdominal pain.  No nausea, no vomiting.  No diarrhea.   Genitourinary: Negative for dysuria. Musculoskeletal: Negative for back pain. Skin: Negative for rash. Neurological: Negative for headaches, focal weakness or numbness.  ____________________________________________   PHYSICAL EXAM:  VITAL SIGNS: ED Triage Vitals  Enc Vitals Group     BP 04/23/19 1927 138/71     Pulse Rate 04/23/19 1927 73     Resp 04/23/19 1927 18     Temp 04/23/19 1927 (!) 97.5 F (36.4 C)     Temp Source 04/23/19 1927 Oral     SpO2 04/23/19 1927 97 %     Weight 04/23/19 1928 210 lb (95.3 kg)     Height 04/23/19 1928 5\' 7"  (1.702 m)     Head Circumference --      [place] --      Pain  Score 04/23/19 1928 0   Constitutional: Alert and oriented.  Eyes: Conjunctivae are normal.  ENT      Head: Normocephalic. Hematoma and 1.5 cm laceration to left eyebrow.       Nose: No congestion/rhinnorhea.      Mouth/Throat: Mucous membranes are moist.      Neck: No stridor. Hematological/Lymphatic/Immunilogical: No cervical lymphadenopathy. Cardiovascular: Normal rate, regular rhythm.  No murmurs, rubs, or gallops.  Respiratory: Normal respiratory effort without tachypnea nor retractions. Breath sounds are clear and equal bilaterally. No wheezes/rales/rhonchi. Gastrointestinal: Soft and non tender. No rebound. No guarding.  Genitourinary: Deferred Musculoskeletal: Normal range of motion in all extremities. No lower extremity edema. Neurologic:  Normal speech and language. No gross focal neurologic deficits are appreciated.  Skin:  Skin is warm, dry and intact. No rash noted. Psychiatric: Mood and affect are normal. Speech and behavior are normal. Patient exhibits appropriate insight and judgment.  ____________________________________________    LABS (pertinent positives/negatives)  BMP wnl except glu 125, bun 43, cr 1.20 CBC wbc 12.5, hgb 13.5, plt 441 UA turbid, rbc and wbc >50, few bacteria, moderate leukocytes ____________________________________________   EKG  I, Nance Pear, attending physician, personally viewed and interpreted this EKG  EKG Time: 1942 Rate: 71 Rhythm: sinus rhythm Axis: normal Intervals: qtc 432 QRS: narrow, q waves III, avf ST changes: no st elevation Impression: abnormal ekg   ____________________________________________    RADIOLOGY  CT head/cervical spine No acute abnormality  ____________________________________________   PROCEDURES  Procedures  LACERATION REPAIR Performed by: Nance Pear Authorized by: Nance Pear Consent: Verbal consent obtained. Risks and benefits: risks, benefits and alternatives were  discussed Consent given by: patient Patient identity confirmed: provided demographic data Prepped and Draped in normal sterile fashion Wound explored  Laceration Location: left eyebrow  Laceration Length: 1.5 cm  No Foreign Bodies seen or palpated  Anesthesia: local infiltration  Local anesthetic: lidocaine 1%   Anesthetic total: 2 ml  Irrigation method: syringe Amount of cleaning: standard  Skin closure: 5-0 vicryl rapide  Number of sutures: 4  Technique: simple interrupted  Patient tolerance: Patient tolerated the procedure well with no immediate complications.  ____________________________________________   INITIAL IMPRESSION / ASSESSMENT AND PLAN / ED COURSE  Pertinent labs & imaging results that were available during my care of the patient were reviewed by me and considered in my medical decision making (see chart for details).   Patient presented to the emergency department today because of a fall.  Patient did suffer laceration and hematoma to her left eyebrow.  CT head and cervical spine without any acute abnormalities.  I did suture close the laceration.  Did check blood work and urine.  Urine was consistent with a urinary tract infection.  Will give patient dose of antibiotics here.  Will plan on discharging with further antibiotics. ____________________________________________   FINAL CLINICAL IMPRESSION(S) / ED DIAGNOSES  Final diagnoses:  Fall, initial encounter  Laceration of scalp, initial encounter  Lower urinary tract infectious disease     Note: This dictation was prepared with Dragon dictation. Any transcriptional errors that result from this process are unintentional     Nance Pear, MD 04/23/19 2157

## 2019-04-23 NOTE — ED Notes (Signed)
Janne, [place] Son [phone]    Called att at pt's request

## 2019-04-23 NOTE — ED Notes (Signed)
Pt otf for imaging 

## 2019-04-23 NOTE — Discharge Instructions (Addendum)
The stitches that were placed today will dissolve on their own in about 7-10 days. Please seek medical attention for any high fevers, chest pain, shortness of breath, change in behavior, persistent vomiting, bloody stool or any other new or concerning symptoms.

## 2019-04-23 NOTE — ED Notes (Signed)
EDP at bedside suturing  

## 2019-04-23 NOTE — ED Triage Notes (Signed)
Patient presents to Emergency Department via [facility] from [place] with complaints of unwitnessed fall.  Staff found pt on floor with bleeding lac to left eyebrow.    Pt bleeding was not controlled upon arrival, pressure applied, known daily ASA, hx of CVA, pt reports son is seen in hallway, oriented to self, and situation

## 2019-04-23 NOTE — ED Notes (Signed)
Estill Bamberg informed transport is needed for pt to d/c back to [facility]

## 2019-04-23 NOTE — ED Notes (Signed)
Report given to Airline pilot at [employer]

## 2019-04-23 NOTE — ED Notes (Signed)
Pt's son Mitzi Hansen informed of pt's discharge

## 2019-04-23 NOTE — ED Notes (Signed)
Patient transported to CT 

## 2019-04-24 DIAGNOSIS — M6281 Muscle weakness (generalized): Secondary | ICD-10-CM | POA: Diagnosis not present

## 2019-04-24 DIAGNOSIS — Z7982 Long term (current) use of aspirin: Secondary | ICD-10-CM | POA: Diagnosis not present

## 2019-04-24 DIAGNOSIS — I69354 Hemiplegia and hemiparesis following cerebral infarction affecting left non-dominant side: Secondary | ICD-10-CM | POA: Diagnosis not present

## 2019-04-24 DIAGNOSIS — I10 Essential (primary) hypertension: Secondary | ICD-10-CM | POA: Diagnosis not present

## 2019-04-24 DIAGNOSIS — Z7902 Long term (current) use of antithrombotics/antiplatelets: Secondary | ICD-10-CM | POA: Diagnosis not present

## 2019-04-25 ENCOUNTER — Ambulatory Visit (INDEPENDENT_AMBULATORY_CARE_PROVIDER_SITE_OTHER): Payer: Medicare Other | Admitting: Adult Health

## 2019-04-25 ENCOUNTER — Telehealth: Payer: Self-pay

## 2019-04-25 ENCOUNTER — Encounter: Payer: Self-pay | Admitting: Adult Health

## 2019-04-25 ENCOUNTER — Other Ambulatory Visit: Payer: Self-pay

## 2019-04-25 VITALS — BP 118/80 | HR 84 | Temp 97.8°F | Ht 67.0 in

## 2019-04-25 DIAGNOSIS — I6521 Occlusion and stenosis of right carotid artery: Secondary | ICD-10-CM | POA: Diagnosis not present

## 2019-04-25 DIAGNOSIS — E785 Hyperlipidemia, unspecified: Secondary | ICD-10-CM

## 2019-04-25 DIAGNOSIS — F0151 Vascular dementia with behavioral disturbance: Secondary | ICD-10-CM

## 2019-04-25 DIAGNOSIS — I10 Essential (primary) hypertension: Secondary | ICD-10-CM

## 2019-04-25 DIAGNOSIS — G309 Alzheimer's disease, unspecified: Secondary | ICD-10-CM | POA: Diagnosis not present

## 2019-04-25 DIAGNOSIS — I69354 Hemiplegia and hemiparesis following cerebral infarction affecting left non-dominant side: Secondary | ICD-10-CM

## 2019-04-25 DIAGNOSIS — I63511 Cerebral infarction due to unspecified occlusion or stenosis of right middle cerebral artery: Secondary | ICD-10-CM

## 2019-04-25 DIAGNOSIS — F329 Major depressive disorder, single episode, unspecified: Secondary | ICD-10-CM

## 2019-04-25 DIAGNOSIS — F01518 Vascular dementia, unspecified severity, with other behavioral disturbance: Secondary | ICD-10-CM

## 2019-04-25 NOTE — Telephone Encounter (Signed)
PLAN OF CARE SIGNED AND PLACED IN MAIL 04-25-19.

## 2019-04-25 NOTE — Progress Notes (Signed)
[facility] [address] [place]. [place] [address] [phone]       HOSPITAL FOLLOW UP NOTE  [name] Date of Birth:  12-19-1933 Medical Record Number:  [id number]   Reason for Referral:  hospital stroke follow up    CHIEF COMPLAINT:  Chief Complaint  Patient presents with  . Follow-up    tx room, alone. confused    HPI: Christy Carter being seen today for in office hospital follow-up regarding right MCA stroke.  History obtained from patient and chart review. Reviewed all radiology images and labs personally.  Christy R Ashbyis a 85 y.o.femalewith history of sleep apnea, HTN, HLD, carpal tunnel, previous strokepresented on [facility] with UTI, pneumonia and left sided weakness.Shedid not receive IV t-PA due to late presentation (>4.5 hours from time of onset). MRI found B small infarcts, largest on the R with severe stenosis throughout. Transferred to [facility] for IR for R M1 stenosis. 9/26/2020s/p angioplasty x 3 with APPROX 70 % flow return.  MRA head/neck prior to intervention showed right vertebral artery severely diseased with long segment occlusion and partial reconstitution distally, moderate to severe stenosis right P1, severe stenosis right P2 segment, moderate stenosis left P1 segment, occluded left A1 segment, severe stenosis right M1 segment and mild to moderate stenosis left M1.  CTA head/neck post angioplasty showed severe stenosis with near occlusion of right M1 segment, occlusion of the left moderate 1 P2 junction, 40% stenosis of right carotid bifurcation, right vertebral artery occluded at its origin, with reconstitution of flow and retrograde flow in the distal right vertebral artery and 50% proximal basilar stenosis.  CT perfusion no CBF less than 30% identified with possible reperfusion of the completed infarctions of the deep brain on the right shown by MRI, small focus of T-max greater than 6  seconds in the right parietal region and 110 cc region of T-max greater than 4 seconds throughout the right MCA distribution indicating at risk brain secondary to the near occlusion of the right MCA.  Repeat MRI 02/07/2019 showed new punctate right temporal occipital lobe infarct, subtle right middle cerebral peduncle abnormality possible stroke, right deep white matter infarct along with small vessel disease, atrophy and old right cerebellar lacunes.  Repeat MRA 01/30/2019 showed persistent right M1 focal stenosis, occlusion left A1, right VA with retrograde flow and mild bilateral PCA P1 P2 junction stenosis.  2D echo largely unremarkable without cardiac source of embolus identified.  Initiated aspirin 81 mg daily and Brilinta.  HTN and recommended long-term BP goal normotensive range.  LDL 139 initiated atorvastatin 40 mg daily.  Other stroke risk factors include advanced age, former tobacco use, obesity, history of stroke/TIA and OSA.  Other active problems include UTI, mild leukocytosis resolved, hypokalemia resolved and agitation/sundowning receiving Haldol during admission and discharged on Seroquel.  Residual deficits of dysphagia, dysarthria, left hemiparesis and left hemisensory impairment and discharged to CIR for ongoing therapy. She unfortunately represented to [facility] on 03/28/2019 due to worsening left hemiparesis over the past week.  MRI showed new right MCA infarct with recurrent critical stenosis/near occlusion right M1 on MRA therefore transferred to [facility] for further intervention.  MRI showed acute small subcortical white matter right frontal lobe infarct along with expected evolution right corona radiata infarct.  MRA head showed recurrent critical stenosis/occlusion right M1 status post angioplasty in 01/2019 with reconstituted right MCA otherwise stable chronic left A1 occlusion, poor flow distal right VA and severe right  PCA stenosis.  MRA neck showed stable chronic right VA occlusion.   Underwent cerebral angiogram with Dr. Estanislado Pandy s/p right MCA angioplasty stent on 04/06/2019.  Postprocedure CT scan showed mild to moderate punctate hemorrhage in basal ganglia area.  Recommend continuation of aspirin 81 mg daily along with Brilinta.  HTN stable and recommended long-term BP goal 1 30-1 50 given right M1 high-grade stenosis.  Recommended continuation of atorvastatin with new LDL 66.  She was ultimately discharged to SNF as recommended by therapy evaluations with cognitive impairment and continued left hemiparesis.  Christy Carter is a 85 year old female who is being seen today for hospital follow-up.  She continues to reside at [facility].  She is unaccompanied at today's visit therefore limited history intake and only per chart review.  Residual deficits left hemiparesis and cognitive impairment worsened from baseline dementia.  Unable to determine if patient is ambulatory and currently sitting in wheelchair.  She does endorse continued therapies.  When asked today's date she states "1212" and when asked what month it is she states "22".  When questioned where she is currently residing, she states [place] living with her daughter.  She is able to state her correct birthdate and age.  She does become tearful during visit and she feels as though she is an "invalid" and is fearful of "having everything taken away from me".  Reviewed medications, she has continued on fluoxetine 10 mg daily and Seroquel 25 mg nightly.  She did sustain a recent fall on 04/23/19 with the ED evaluation with CT head no acute abnormalities but did find UTI and treated with course of antibiotics.  Discharged back to the facility.  She does continue to have moderate left orbital bruising and intact stitches above left eyebrow.  No further concerns at this time.    ROS:   14 system review of systems performed and negative with exception of memory loss, confusion,, depression, bruising, anxiety and weakness  PMH:   Past Medical History:  Diagnosis Date  . Arthritis   . Carpal tunnel syndrome   . Complication of anesthesia    Difficulty waking up. Believes it took 2 days to wake up after her surgery.  . Depression   . Hyperlipidemia   . Hypertension   . Shortness of breath   . Stroke ([place])   . Thyroid disease     PSH:  Past Surgical History:  Procedure Laterality Date  . APPENDECTOMY    . BACK SURGERY    . CHOLECYSTECTOMY    . HERNIA REPAIR    . IR ANGIO INTRA EXTRACRAN SEL COM CAROTID INNOMINATE BILAT MOD SED  03/31/2019  . IR ANGIO VERTEBRAL SEL SUBCLAVIAN INNOMINATE UNI R MOD SED  03/31/2019  . IR CT HEAD LTD  02/05/2019  . IR INTRA CRAN STENT  04/06/2019  . IR PTA INTRACRANIAL  02/05/2019  . IR US GUIDE VASC ACCESS RIGHT  03/31/2019  . RADIOLOGY WITH ANESTHESIA N/A 02/05/2019   Procedure: CODE STROKE;  Surgeon: Radiologist, Medication, MD;  Location: [place];  Service: Radiology;  Laterality: N/A;  . RADIOLOGY WITH ANESTHESIA N/A 04/06/2019   Procedure: STENTING;  Surgeon: [name], MD;  Location: [facility];  Service: Radiology;  Laterality: N/A;    Social History:  Social History   Socioeconomic History  . Marital status: Widowed    Spouse name: Not on file  . Number of children: Not on file  . Years of education: Not on file  . Highest education level:  Not on file  Occupational History  . Not on file  Tobacco Use  . Smoking status: Former Smoker    Types: Cigarettes    Quit date: 08/15/1992    Years since quitting: 26.7  . Smokeless tobacco: Never Used  Substance and Sexual Activity  . Alcohol use: No  . Drug use: No  . Sexual activity: Not Currently  Other Topics Concern  . Not on file  Social History Narrative  . Not on file   Social Determinants of Health   Financial Resource Strain:   . Difficulty of Paying Living Expenses: Not on file  Food Insecurity:   . Worried About Charity fundraiser in the Last Year: Not on file  . Ran Out of Food in the Last  Year: Not on file  Transportation Needs:   . Lack of Transportation (Medical): Not on file  . Lack of Transportation (Non-Medical): Not on file  Physical Activity:   . Days of Exercise per Week: Not on file  . Minutes of Exercise per Session: Not on file  Stress:   . Feeling of Stress : Not on file  Social Connections:   . Frequency of Communication with Friends and Family: Not on file  . Frequency of Social Gatherings with Friends and Family: Not on file  . Attends Religious Services: Not on file  . Active Member of Clubs or Organizations: Not on file  . Attends Archivist Meetings: Not on file  . Marital Status: Not on file  Intimate Partner Violence:   . Fear of Current or Ex-Partner: Not on file  . Emotionally Abused: Not on file  . Physically Abused: Not on file  . Sexually Abused: Not on file    Family History:  Family History  Problem Relation Age of Onset  . Heart disease Mother   . Hypertension Mother   . Diverticulitis Mother   . Heart disease Father   . Hypertension Father     Medications:   Current Outpatient Medications on File Prior to Visit  Medication Sig Dispense Refill  . acetaminophen (TYLENOL) 325 MG tablet Take 2 tablets (650 mg total) by mouth every 4 (four) hours as needed for mild pain (or temp > 37.5 C (99.5 F)).    Marland Kitchen amLODipine (NORVASC) 10 MG tablet Take 1 tablet (10 mg total) by mouth daily. 30 tablet 0  . aspirin EC 81 MG EC tablet Take 1 tablet (81 mg total) by mouth daily.    Marland Kitchen atorvastatin (LIPITOR) 40 MG tablet Take 1 tablet (40 mg total) by mouth daily at 6 PM. 30 tablet 0  . bisoprolol (ZEBETA) 5 MG tablet Take 0.5 tablets (2.5 mg total) by mouth daily. 30 tablet 0  . cloNIDine (CATAPRES - DOSED IN MG/24 HR) 0.1 mg/24hr patch Place 1 patch (0.1 mg total) onto the skin every Monday. 4 patch 12  . Cyanocobalamin (B-12) 2500 MCG TABS Take 2,500 mcg by mouth daily as needed (For Lethargy).     Marland Kitchen FLUoxetine (PROZAC) 10 MG capsule Take  1 capsule (10 mg total) by mouth daily. 30 capsule 3  . levothyroxine (SYNTHROID) 150 MCG tablet Take 1 tablet (150 mcg total) by mouth daily before breakfast. 30 tablet 0  . omeprazole (PRILOSEC) 40 MG capsule Take 1 capsule (40 mg total) by mouth daily. 90 capsule 2  . QUEtiapine (SEROQUEL) 25 MG tablet Take 1 tablet (25 mg total) by mouth at bedtime. 30 tablet 0  . ticagrelor (BRILINTA) 90  MG TABS tablet Take 1 tablet (90 mg total) by mouth 2 (two) times daily. 60 tablet 1  . VENTOLIN HFA 108 (90 Base) MCG/ACT inhaler TAKE 2 PUFFS BY MOUTH EVERY 6 HOURS AS NEEDED FOR WHEEZE OR SHORTNESS OF BREATH (Patient taking differently: Inhale 2 puffs into the lungs every 6 (six) hours as needed for shortness of breath. ) 18 Inhaler 1  . Vitamin D, Ergocalciferol, (DRISDOL) 1.25 MG (50000 UT) CAPS capsule Take 1 capsule (50,000 Units total) by mouth every 7 (seven) days. 10 capsule 0  . allopurinol (ZYLOPRIM) 100 MG tablet Take 1 tablet (100 mg total) by mouth daily. 30 tablet 0  . cephALEXin (KEFLEX) 500 MG capsule Take 1 capsule (500 mg total) by mouth 4 (four) times daily for 10 days. 40 capsule 0  . diclofenac sodium (VOLTAREN) 1 % GEL Apply 4 g topically 4 (four) times daily. (Patient taking differently: Apply 4 g topically 4 (four) times daily as needed (For pain). ) 4 g 0   No current facility-administered medications on file prior to visit.    Allergies:  No Known Allergies   Physical Exam  Vitals:   04/25/19 1333  BP: 118/80  Pulse: 84  Temp: 97.8 F (36.6 C)  Height: 5\' 7"  (1.702 m)   Body mass index is 32.89 kg/m. No exam data present   General: well developed, well nourished, seated, in no evident distress Head: head normocephalic and atraumatic.   Neck: supple with no carotid or supraclavicular bruits Cardiovascular: regular rate and rhythm, no murmurs Musculoskeletal: no deformity Skin:  no rash/petichiae; left orbital ecchymosis; left orbital laceration sutures  intact Vascular:  Normal pulses all extremities   Neurologic Exam Mental Status: Awake and fully alert.   Mild dysarthria but unsure if baseline.  Oriented to self disoriented to place and time. Recent and remote memory impaired. Attention span, concentration and fund of knowledge impaired.  Initially patient calm but then became tearful regarding current medical condition Cranial Nerves: Fundoscopic exam reveals sharp disc margins. Pupils equal, briskly reactive to light. Extraocular movements full without nystagmus. Visual fields full to confrontation. Hearing diminished. Facial sensation intact.   Left lower facial droop Motor: Normal bulk and tone.  LUE: 3+/5 LLE: 4/5 Full strength right upper and lower extremity Sensory.: intact to touch , pinprick , position and vibratory sensation.  Coordination: Rapid alternating movements normal in all extremities except decreased left hand. Finger-to-nose and heel-to-shin performed accurately on right side. Gait and Station: Deferred as patient nonambulatory Reflexes: 1+ and symmetric. Toes downgoing.     NIHSS  7 Modified Rankin  3-4    Diagnostic Data (Labs, Imaging, Testing)  Ct Head Wo Contrast 02/03/2019 Atrophy, chronic microvascular disease. No acute intracranial abnormality.  Ct Head Wo Contrast 02/05/2019 No change from yesterday Moderate atrophy. Microvascular ischemic changes are present a white matter right greater than left, stable from prior study.   Mr Angio Head Wo Contrast Mr Angio Neck W Wo Contrast Mr Brain Wo Contrast 02/05/2019 1.Acute infarct deep white matter on the right in a watershed territory, likely due to severe stenosis right M1 segment.  2.Small acute infarct left lateral posterior temporal lobe  3. Carotid bifurcation widely patent in the neck. Mild to moderate stenosis of the left petrous carotid.  4. Left vertebral artery widely patent. Right vertebral artery severely diseased with long segment  occlusion and partial reconstitution distally.  5. Moderate to severe stenosis right P1. Severe stenosis right P2 segment. Moderate stenosis left P1  segment. 6. Occluded left A1 segment. Severe stenosis right M1 segment. Mild to moderate stenosis left M1.   Ct Angio Head W Or Wo Contrast Ct Angio Neck W Or Wo Contrast Ct Cerebral Perfusion W Contrast 02/05/2019 1. CT angiography confirms the MR findings.  2. Severe stenosis with near occlusion of the right M1 segment.  3. Occlusion of the left A1 segment.  4. Moderate stenosis of the right P1 P2 junction. 40% stenosis at the right carotid bifurcation.  5. Right vertebral artery occluded at its origin. Reconstituted flow and retrograde flow in the distal right vertebral artery. 50% proximal basilar stenosis.  6. No CBF less than 30% identified by this study. Possible reperfusion of the completed infarctions of the deep brain on the right shown by MRI.  7. Small focus of T-max greater than 6 seconds in the right parietal region. 110 cc region of T-max greater than 4 seconds throughout the right middle cerebral artery distribution, indicating at risk brain secondary to the near occlusion of the right MCA.   Interventional Radiology 02/05/2019 S/balloon angioplasty x 3 of pre occlusive symptomatic rt mca m1 SEG TO IMPROVED CALIBER TO APPROX 70 %.  Mr Brain Wo Contrast 02/07/2019 1. Significantly motion degraded and limited examination. 2. New punctate acute/early subacute infarct within the right temporal occipital lobe.  3. 11 mm focus of subtle apparent restricted diffusion within the right middle cerebellar peduncle. An acute/early subacute infarct cannot be excluded, although the diffusion abnormality is subtle and this may reflect artifact.  4. Persistent restricted diffusion at site of subacute deep white matter infarct on the right.  5. Atrophy and chronic small vessel ischemic disease. Redemonstrated chronic right cerebellar lacunar  infarcts.  Mr Angio Head Wo Contrast 02/07/2019 1. Significantly motion degraded examination. 2. Persistent apparent severe focal stenosis within the M1 right middle cerebral artery. 3. Redemonstrated occlusion of the A1 left anterior cerebral artery. 4. Suspected retrograde flow within the intracranial right vertebral artery. The right cervical vertebral artery was a occluded on recent prior CTA. 5. Moderate stenoses within the bilateral posterior cerebral arteries at the P1-P2 junctions.  Transthoracic Echocardiogram  02/03/2019 1. Left ventricular ejection fraction, by visual estimation, is 65 to 70%. The left ventricle has hyperdynamic function. There is no left ventricular hypertrophy. 2. Global right ventricle has normal systolic function.The right ventricular size is normal. No increase in right ventricular wall thickness. 3. Left atrial size was normal. 4. Right atrial size was normal. 5. The mitral valve is normal in structure. Trace mitral valve regurgitation. 6. The tricuspid valve is normal in structure. Tricuspid valve regurgitation is trivial. 7. The aortic valve is normal in structure. Aortic valve regurgitation was not visualized by color flow Doppler. 8. The pulmonic valve was not well visualized. Pulmonic valve regurgitation was not assessed by color flow Doppler. 9. The aortic root was not well visualized. 10. The interatrial septum was not well visualized.  CXR 02/08/2019 Interval improvement in diffuse interstitial infiltrates, likely due to decreased interstitial edema. No significant change in bibasilar atelectasis and probable small left pleural effusion. 02/07/2019 1. Increasingly coalescent hazy opacity throughout the lower lungs with some interval development of fissural thickening. Findings could reflect interstitial/alveolar edema superimposed on more chronic interstitial changes. Infection could have a similar appearance and should be excluded  clinically. 2. Possible trace left pleural effusion. 02/04/2019 1. Stable exam from the previous day's study. 2. Vascular congestion with interstitial prominence. No evidence of pneumonia. 02/03/2019 1. Mild pulmonary vascular congestion. 2.  Streaky bibasilar opacities, favor atelectasis.  ECG - ST rate 104 BPM. (See cardiology reading for complete details)  CT HEAD WO CONTRAST 03/28/2019 IMPRESSION: Head CT: No acute or traumatic finding. Atrophy and chronic small-vessel ischemic changes. Cervical spine CT: No acute or traumatic finding. Degenerative spondylosis and facet osteoarthritis.  MR BRAIN WO CONTRAST 03/28/2019 IMPRESSION: 1. Small acute lacunar infarct in subcortical white matter of the right middle frontal gyrus. No associated hemorrhage or mass effect. 2. Expected evolution of the right corona radiata infarct since September. Otherwise stable chronic small vessel disease. Mild left occipital lobe superficial siderosis. 3. Chronically abnormal flow in the distal right vertebral artery.  MR ANGIO HEAD WO CONTRAST MR ANGIO NECK WO CONTRAST 03/29/2019 IMPRESSION: 1. Recurrent Critical Stenosis/Near Occlusion of the Right MCA M1 segment which was angioplastied in September. Reconstituted right MCA bifurcation, with no right MCA branch occlusion identified. 2. Otherwise stable intracranial MRA including chronic left A1 occlusion, poor flow in the distal right vertebral artery, and severe right PCA stenosis. 3. Neck MRA redemonstrates chronic right Vertebral artery occlusion, with no significant left vertebral or cervical carotid artery Stenosis.  CEREBRAL ANGIOGRAM 03/31/2019 IMPRESSION:  Severe high-grade stenosis of the right middle cerebral artery M1  segment compared to the immediate post angioplasty caliber of  February 05, 2019.  Severe stenosis of the left anterior cerebral A1 segment.  Approximately 50% stenosis of the left internal carotid artery in   the petrous cavernous junction, and caval cavernous segment.  Approximately 20% stenosis of the right internal carotid artery at  the bulb by the NASCET criteria.   Intracranial stent 04/06/2019 IMPRESSION: Status post endovascular revascularization of severely symptomatic stenosis of the right middle cerebral artery with stent assisted angioplasty and placement of a 3.5 mm x 20 mm Wingspan stent system.  CT HEAD WO CONTRAST Post procedure 04/06/2019 IMPRESSION: Right MCA stent placed today. Small areas of acute hemorrhage in the right basal ganglia have developed since prior studies and appear acute. Atrophy and chronic ischemia.  No acute ischemic infarction    ASSESSMENT: KEILY ERTZ is a 85 y.o. year old female presented with left-sided weakness, UTI and pneumonia on 02/05/2019 with stroke work-up showing right watershed territory infarct in setting of severe stenosis right M1 segment status post IR with partial revascularization and possible small left lateral posterior temporal lobe infarct and right middle cerebellar peduncle in setting of diffuse intracranial stenosis as likely etiology.  She then returned on 03/29/2019 due to worsening of left hemiparesis with stroke work-up showing new right MCA infarct with recurrent critical stenosis/near occlusion of right M1 therefore underwent right MCA angioplasty stent on 04/06/2019 with Dr. Estanislado Pandy.  Vascular risk factors include severe intracranial and extracranial stenosis, HTN, HLD, dementia, former tobacco use, advanced age, obesity, history of stroke/TIA and OSA.  Residual left hemiparesis, left facial droop and cognitive impairment    PLAN:  1. Right MCA strokes: Continue aspirin 81 mg daily and Brilinta (ticagrelor) 90 mg bid  and atorvastatin for secondary stroke prevention. Maintain strict control of hypertension with blood pressure goal below 130/90, diabetes with hemoglobin A1c goal below 6.5% and cholesterol with LDL  cholesterol (bad cholesterol) goal below 70 mg/dL.  I also advised the patient to eat a healthy diet with plenty of whole grains, cereals, fruits and vegetables, exercise regularly with at least 30 minutes of continuous activity daily and maintain ideal body weight. 2. HTN: Advised to continue current treatment regimen.  Today's BP stable.  Advised to continue  to monitor at home along with continued follow-up with PCP for management 3. HLD: Advised to continue current treatment regimen along with continued follow-up with PCP for future prescribing and monitoring of lipid panel 4. Intra and extracranial stenosis: Continuation of aspirin, Brilinta and statin.  Recommended follow-up 4 weeks post stent placement with Dr. Estanislado Pandy 5. Left hemiparesis: Continuation of therapies at SNF for ongoing improvement 6. Cognitive impairment: Reports of underlying dementia and worsening post stroke.  Tearful during visit with possible underlying untreated depression/anxiety.  Currently on Prozac and Seroquel but unable to determine potential side effects therefore will defer possible dosage increase her medication changes to facility providers.  Continue to work with speech therapy   Follow up in 3 months or call earlier if needed   Greater than 50% of time during this 45 minute visit was spent on counseling, explanation of diagnosis of right MCA strokes with severe intra and extracranial stenosis, reviewing risk factor management of intra and extracranial stenosis, HTN, HLD and dementia, planning of further management along with potential future management, and discussion with patient answering all questions to satisfaction    Frann Rider, AGNP-BC  [facility] [address] [place], [place] [address]  Phone [phone] Fax [phone] Note: This document was prepared with digital dictation and possible smart phrase technology. Any transcriptional errors that result from  this process are unintentional.

## 2019-04-25 NOTE — Patient Instructions (Signed)
Continue aspirin 81 mg daily and Brilinta (ticagrelor) 90 mg bid  and Lipitor for secondary stroke prevention  Continue to follow up with PCP regarding cholesterol and blood pressure management   Follow-up with vascular surgery Dr. Estanislado Pandy in 1 to 2 weeks as recommended 4-week post procedure follow-up  Continue to work with therapies for ongoing improvement  Recommend consideration of increasing depression/anxiety management -unable to adequately assess potential side effects with current medication usage therefore I will defer possible dosage increase or change of antidepressants to SNF providers  Continue to monitor blood pressure at home  Maintain strict control of hypertension with blood pressure goal below 130/90, diabetes with hemoglobin A1c goal below 6.5% and cholesterol with LDL cholesterol (bad cholesterol) goal below 70 mg/dL. I also advised the patient to eat a healthy diet with plenty of whole grains, cereals, fruits and vegetables, exercise regularly and maintain ideal body weight.  Followup in the future with me in 3 months or call earlier if needed       Thank you for coming to see [place] at [facility]. I hope we have been able to provide you high quality care today.  You may receive a patient satisfaction survey over the next few weeks. We would appreciate your feedback and comments so that we may continue to improve ourselves and the health of our patients.

## 2019-04-27 LAB — URINE CULTURE: Culture: >=100000 — AB

## 2019-04-28 ENCOUNTER — Telehealth: Payer: Self-pay

## 2019-04-28 NOTE — Progress Notes (Signed)
Brief Pharmacy Note  Patient is an 85 y/o F with history of HTN, HLD, previous stroke and s/p right MCA stent on 04/06/2019 who presented to [facility] ED from [facility] 12/12 c/o fall with laceration of scalp. CT head and cervical spine with no acute abnormalities. Denied recent illness or fever. ROS genitourinary deferred per chart. WBC 12.5. UA was consistent with urinary tract infection and patient was discharged on cephalexin x 10 days. Urine culture from ED visit has resulted >100k colonies/mL Morganella morganii and >100k colonies/mL Viridans Streptococcus. Discharge antibiotic with empiric coverage of Viridans Streptococcus but does not cover Morganella morganii. Spoke with EDP regarding results with plan to call and assess patient. Reached nurse caring for patient and she said patient is at her baseline with no complaints of urinary symptoms or fevers. She is taking discharge antibiotic. She said that staff member who works with facility provider was present and would review culture and decide with provider if new antibiotic warranted. Faxed culture results to number provided.  [place] Resident 28 April 2019

## 2019-04-28 NOTE — Telephone Encounter (Signed)
Confirmed appointment with patient. [id number]

## 2019-04-30 ENCOUNTER — Other Ambulatory Visit: Payer: Self-pay | Admitting: Adult Health

## 2019-04-30 DIAGNOSIS — E559 Vitamin D deficiency, unspecified: Secondary | ICD-10-CM

## 2019-05-02 ENCOUNTER — Encounter: Payer: Self-pay | Admitting: Internal Medicine

## 2019-05-02 ENCOUNTER — Ambulatory Visit (INDEPENDENT_AMBULATORY_CARE_PROVIDER_SITE_OTHER): Payer: Medicare Other | Admitting: Internal Medicine

## 2019-05-02 ENCOUNTER — Other Ambulatory Visit: Payer: Self-pay

## 2019-05-02 VITALS — HR 59 | Temp 95.0°F | Resp 16 | Ht 67.0 in

## 2019-05-02 DIAGNOSIS — R911 Solitary pulmonary nodule: Secondary | ICD-10-CM | POA: Diagnosis not present

## 2019-05-02 DIAGNOSIS — G4733 Obstructive sleep apnea (adult) (pediatric): Secondary | ICD-10-CM | POA: Diagnosis not present

## 2019-05-02 NOTE — Progress Notes (Signed)
[facility] [place], [place] [address]  Pulmonary Sleep Medicine   Office Visit Note  Patient Name: Christy Carter DOB: Dec 05, 1933 MRN [id number]  Date of Service: 05/02/2019  Complaints/HPI: Pt is here for pulmonary follow up.  Her daughter is in exam room with her.  She reports the patient has had multiple strokes since September.  She has deteriorated a lot, and is now living at [facility] a local facility. Her daughter was trying to care for her at home, but it became to much and [facility]. She is pleasant and cooperative at this visit, however she is alert to Person and place as well as time.  She is talking about some things that are incorrect per her daughter.  She continues to tell me she had a new grand-baby this week, her daughter is refuting this.      ROS  General: (-) fever, (-) chills, (-) night sweats, (-) weakness Skin: (-) rashes, (-) itching,. Eyes: (-) visual changes, (-) redness, (-) itching. Nose and Sinuses: (-) nasal stuffiness or itchiness, (-) postnasal drip, (-) nosebleeds, (-) sinus trouble. Mouth and Throat: (-) sore throat, (-) hoarseness. Neck: (-) swollen glands, (-) enlarged thyroid, (-) neck pain. Respiratory: - cough, (-) bloody sputum, - shortness of breath, - wheezing. Cardiovascular: - ankle swelling, (-) chest pain. Lymphatic: (-) lymph node enlargement. Neurologic: (-) numbness, (-) tingling. Psychiatric: (-) anxiety, (-) depression   Current Medication: Outpatient Encounter Medications as of 05/02/2019  Medication Sig  . acetaminophen (TYLENOL) 325 MG tablet Take 2 tablets (650 mg total) by mouth every 4 (four) hours as needed for mild pain (or temp > 37.5 C (99.5 F)).  Marland Kitchen allopurinol (ZYLOPRIM) 100 MG tablet Take 1 tablet (100 mg total) by mouth daily.  Marland Kitchen amLODipine (NORVASC) 10 MG tablet Take 1 tablet (10 mg total) by mouth daily.  Marland Kitchen aspirin EC 81 MG EC tablet Take 1 tablet (81 mg  total) by mouth daily.  Marland Kitchen atorvastatin (LIPITOR) 40 MG tablet Take 1 tablet (40 mg total) by mouth daily at 6 PM.  . bisoprolol (ZEBETA) 5 MG tablet Take 0.5 tablets (2.5 mg total) by mouth daily.  . cephALEXin (KEFLEX) 500 MG capsule Take 1 capsule (500 mg total) by mouth 4 (four) times daily for 10 days.  . cloNIDine (CATAPRES - DOSED IN MG/24 HR) 0.1 mg/24hr patch Place 1 patch (0.1 mg total) onto the skin every Monday.  . Cyanocobalamin (B-12) 2500 MCG TABS Take 2,500 mcg by mouth daily as needed (For Lethargy).   Marland Kitchen diclofenac sodium (VOLTAREN) 1 % GEL Apply 4 g topically 4 (four) times daily. (Patient taking differently: Apply 4 g topically 4 (four) times daily as needed (For pain). )  . FLUoxetine (PROZAC) 10 MG capsule Take 1 capsule (10 mg total) by mouth daily.  Marland Kitchen levothyroxine (SYNTHROID) 150 MCG tablet Take 1 tablet (150 mcg total) by mouth daily before breakfast.  . omeprazole (PRILOSEC) 40 MG capsule Take 1 capsule (40 mg total) by mouth daily.  . QUEtiapine (SEROQUEL) 25 MG tablet Take 1 tablet (25 mg total) by mouth at bedtime.  . ticagrelor (BRILINTA) 90 MG TABS tablet Take 1 tablet (90 mg total) by mouth 2 (two) times daily.  . VENTOLIN HFA 108 (90 Base) MCG/ACT inhaler TAKE 2 PUFFS BY MOUTH EVERY 6 HOURS AS NEEDED FOR WHEEZE OR SHORTNESS OF BREATH (Patient taking differently: Inhale 2 puffs into the lungs every 6 (  six) hours as needed for shortness of breath. )  . Vitamin D, Ergocalciferol, (DRISDOL) 1.25 MG (50000 UT) CAPS capsule TAKE 1 CAPSULE (50,000 UNITS TOTAL) BY MOUTH EVERY 7 (SEVEN) DAYS.   No facility-administered encounter medications on file as of 05/02/2019.    Surgical History: Past Surgical History:  Procedure Laterality Date  . APPENDECTOMY    . BACK SURGERY    . CHOLECYSTECTOMY    . HERNIA REPAIR    . IR ANGIO INTRA EXTRACRAN SEL COM CAROTID INNOMINATE BILAT MOD SED  03/31/2019  . IR ANGIO VERTEBRAL SEL SUBCLAVIAN INNOMINATE UNI R MOD SED  03/31/2019  .  IR CT HEAD LTD  02/05/2019  . IR INTRA CRAN STENT  04/06/2019  . IR PTA INTRACRANIAL  02/05/2019  . IR US GUIDE VASC ACCESS RIGHT  03/31/2019  . RADIOLOGY WITH ANESTHESIA N/A 02/05/2019   Procedure: CODE STROKE;  Surgeon: Radiologist, Medication, MD;  Location: [place];  Service: Radiology;  Laterality: N/A;  . RADIOLOGY WITH ANESTHESIA N/A 04/06/2019   Procedure: STENTING;  Surgeon: [name], MD;  Location: [place];  Service: Radiology;  Laterality: N/A;    Medical History: Past Medical History:  Diagnosis Date  . Arthritis   . Carpal tunnel syndrome   . Complication of anesthesia    Difficulty waking up. Believes it took 2 days to wake up after her surgery.  . Depression   . Hyperlipidemia   . Hypertension   . Shortness of breath   . Stroke ([place])   . Thyroid disease     Family History: Family History  Problem Relation Age of Onset  . Heart disease Mother   . Hypertension Mother   . Diverticulitis Mother   . Heart disease Father   . Hypertension Father     Social History: Social History   Socioeconomic History  . Marital status: Widowed    Spouse name: Not on file  . Number of children: Not on file  . Years of education: Not on file  . Highest education level: Not on file  Occupational History  . Not on file  Tobacco Use  . Smoking status: Former Smoker    Types: Cigarettes    Quit date: 08/15/1992    Years since quitting: 26.7  . Smokeless tobacco: Never Used  Substance and Sexual Activity  . Alcohol use: No  . Drug use: No  . Sexual activity: Not Currently  Other Topics Concern  . Not on file  Social History Narrative  . Not on file   Social Determinants of Health   Financial Resource Strain:   . Difficulty of Paying Living Expenses: Not on file  Food Insecurity:   . Worried About Charity fundraiser in the Last Year: Not on file  . Ran Out of Food in the Last Year: Not on file  Transportation Needs:   . Lack of Transportation (Medical): Not on  file  . Lack of Transportation (Non-Medical): Not on file  Physical Activity:   . Days of Exercise per Week: Not on file  . Minutes of Exercise per Session: Not on file  Stress:   . Feeling of Stress : Not on file  Social Connections:   . Frequency of Communication with Friends and Family: Not on file  . Frequency of Social Gatherings with Friends and Family: Not on file  . Attends Religious Services: Not on file  . Active Member of Clubs or Organizations: Not on file  . Attends Archivist  Meetings: Not on file  . Marital Status: Not on file  Intimate Partner Violence:   . Fear of Current or Ex-Partner: Not on file  . Emotionally Abused: Not on file  . Physically Abused: Not on file  . Sexually Abused: Not on file    Vital Signs: Pulse (!) 59, temperature (!) 95 F (35 C), resp. rate 16, height 5\' 7"  (1.702 m), SpO2 96 %.  Examination: General Appearance: The patient is well-developed, well-nourished, and in no distress. Skin: Gross inspection of skin unremarkable. Head: normocephalic, no gross deformities. Eyes: no gross deformities noted. ENT: ears appear grossly normal no exudates. Neck: Supple. No thyromegaly. No LAD. Respiratory: clear bilaterally. Cardiovascular: Normal S1 and S2 without murmur or rub. Extremities: No cyanosis. pulses are equal. Neurologic: Alert and oriented. No involuntary movements.  LABS: Recent Results (from the past 2160 hour(s))  POCT Urinalysis Dipstick     Status: Abnormal   Collection Time: 02/02/19 10:33 AM  Result Value Ref Range   Color, UA     Clarity, UA     Glucose, UA Negative Negative   Bilirubin, UA negative    Ketones, UA negative    Spec Grav, UA 1.010 1.010 - 1.025   Blood, UA trace    pH, UA 5.0 5.0 - 8.0   Protein, UA Positive (A) Negative   Urobilinogen, UA 0.2 0.2 or 1.0 E.U./dL   Nitrite, UA positive    Leukocytes, UA Moderate (2+) (A) Negative   Appearance     Odor    CULTURE, URINE COMPREHENSIVE      Status: None   Collection Time: 02/02/19 10:39 AM   Specimen: Urine   URINE  Result Value Ref Range   Urine Culture, Comprehensive Final report    Organism ID, Bacteria Comment     Comment: More than 3 organisms recovered, none predominant. Please submit another culture if clinically indicated. 50,000-100,000 colony forming units per mL   CBC with Differential     Status: Abnormal   Collection Time: 02/03/19  9:42 AM  Result Value Ref Range   WBC 15.8 (H) 4.0 - 10.5 K/uL   RBC 4.75 3.87 - 5.11 MIL/uL   Hemoglobin 13.8 12.0 - 15.0 g/dL   HCT 43.3 36.0 - 46.0 %   MCV 91.2 80.0 - 100.0 fL   MCH 29.1 26.0 - 34.0 pg   MCHC 31.9 30.0 - 36.0 g/dL   RDW 15.2 11.5 - 15.5 %   Platelets 269 150 - 400 K/uL   nRBC 0.0 0.0 - 0.2 %   Neutrophils Relative % 90 %   Neutro Abs 14.4 (H) 1.7 - 7.7 K/uL   Lymphocytes Relative 4 %   Lymphs Abs 0.6 (L) 0.7 - 4.0 K/uL   Monocytes Relative 4 %   Monocytes Absolute 0.6 0.1 - 1.0 K/uL   Eosinophils Relative 1 %   Eosinophils Absolute 0.2 0.0 - 0.5 K/uL   Basophils Relative 1 %   Basophils Absolute 0.1 0.0 - 0.1 K/uL   Immature Granulocytes 0 %   Abs Immature Granulocytes 0.06 0.00 - 0.07 K/uL    Comment: Performed at [facility], [address] [place], [place] [id number]  Basic metabolic panel     Status: Abnormal   Collection Time: 02/03/19  9:42 AM  Result Value Ref Range   Sodium 143 135 - 145 mmol/L   Potassium 3.5 3.5 - 5.1 mmol/L   Chloride 101 98 - 111 mmol/L   CO2 28 22 -  32 mmol/L   Glucose, Bld 122 (H) 70 - 99 mg/dL   BUN 23 8 - 23 mg/dL   Creatinine, Ser 0.92 0.44 - 1.00 mg/dL   Calcium 9.2 8.9 - 10.3 mg/dL   GFR calc non Af Amer 58 (L) >60 mL/min   GFR calc Af Amer >60 >60 mL/min   Anion gap 14 5 - 15    Comment: Performed at [facility], [place], [place] [address]  Troponin I (High Sensitivity)     Status: None   Collection Time: 02/03/19  9:42 AM  Result Value Ref Range   Troponin I  (High Sensitivity) 12 <18 ng/L    Comment: (NOTE) Elevated high sensitivity troponin I (hsTnI) values and significant  changes across serial measurements may suggest ACS but many other  chronic and acute conditions are known to elevate hsTnI results.  Refer to the "Links" section for chest pain algorithms and additional  guidance. Performed at [facility], [address] [place], [place] [address]   CK     Status: Abnormal   Collection Time: 02/03/19  9:42 AM  Result Value Ref Range   Total CK 327 (H) 38 - 234 U/L    Comment: Performed at [facility], [address] [place], [place] [address]  Lactic acid, plasma     Status: None   Collection Time: 02/03/19  9:42 AM  Result Value Ref Range   Lactic Acid, Venous 1.2 0.5 - 1.9 mmol/L    Comment: Performed at [facility], [address] [place], [place] [address]  Brain natriuretic peptide     Status: Abnormal   Collection Time: 02/03/19  9:42 AM  Result Value Ref Range   B Natriuretic Peptide 314.0 (H) 0.0 - 100.0 pg/mL    Comment: Performed at [facility], [address] [place], [place] [address]  Blood culture (routine x 2)     Status: None   Collection Time: 02/03/19  9:43 AM   Specimen: BLOOD  Result Value Ref Range   Specimen Description BLOOD RIGHT ANTECUBITAL    Special Requests Blood Culture adequate volume    Culture      NO GROWTH 5 DAYS Performed at [facility], [address], [place], [place] [address]    Report Status 02/08/2019 FINAL   SARS Coronavirus 2 [facility] order, Performed in [facility] hospital lab) Nasopharyngeal Nasopharyngeal Swab     Status: None   Collection Time: 02/03/19  9:52 AM   Specimen: Nasopharyngeal Swab  Result Value Ref Range   SARS Coronavirus 2 NEGATIVE NEGATIVE    Comment: (NOTE) If result is NEGATIVE SARS-CoV-2 target nucleic acids are NOT DETECTED. The SARS-CoV-2 RNA is generally detectable in upper and lower   respiratory specimens during the acute phase of infection. The lowest  concentration of SARS-CoV-2 viral copies this assay can detect is 250  copies / mL. A negative result does not preclude SARS-CoV-2 infection  and should not be used as the sole basis for treatment or other  patient management decisions.  A negative result may occur with  improper specimen collection / handling, submission of specimen other  than nasopharyngeal swab, presence of viral mutation(s) within the  areas targeted by this assay, and inadequate number of viral copies  (<250 copies / mL). A negative result must be combined with clinical  observations, patient history, and epidemiological information. If result is POSITIVE SARS-CoV-2 target nucleic acids are DETECTED. The SARS-CoV-2 RNA  is generally detectable in upper and lower  respiratory specimens dur ing the acute phase of infection.  Positive  results are indicative of active infection with SARS-CoV-2.  Clinical  correlation with patient history and other diagnostic information is  necessary to determine patient infection status.  Positive results do  not rule out bacterial infection or co-infection with other viruses. If result is PRESUMPTIVE POSTIVE SARS-CoV-2 nucleic acids MAY BE PRESENT.   A presumptive positive result was obtained on the submitted specimen  and confirmed on repeat testing.  While 2019 novel coronavirus  (SARS-CoV-2) nucleic acids may be present in the submitted sample  additional confirmatory testing may be necessary for epidemiological  and / or clinical management purposes  to differentiate between  SARS-CoV-2 and other Sarbecovirus currently known to infect humans.  If clinically indicated additional testing with an alternate test  methodology [phone]) is advised. The SARS-CoV-2 RNA is generally  detectable in upper and lower respiratory sp ecimens during the acute  phase of infection. The expected result is Negative. Fact  Sheet for Patients:  StrictlyIdeas.no Fact Sheet for Healthcare Providers: BankingDealers.co.za This test is not yet approved or cleared by the [place] FDA and has been authorized for detection and/or diagnosis of SARS-CoV-2 by FDA under an Emergency Use Authorization (EUA).  This EUA will remain in effect (meaning this test can be used) for the duration of the COVID-19 declaration under Section 564(b)(1) of the Act, 21 U.S.C. section 360bbb-3(b)(1), unless the authorization is terminated or revoked sooner. Performed at [facility], [address] [place], [place] [address]   Blood culture (routine x 2)     Status: None   Collection Time: 02/03/19  9:52 AM   Specimen: BLOOD  Result Value Ref Range   Specimen Description BLOOD BLOOD LEFT HAND    Special Requests      BOTTLES DRAWN AEROBIC AND ANAEROBIC Blood Culture results may not be optimal due to an inadequate volume of blood received in culture bottles   Culture      NO GROWTH 5 DAYS Performed at [facility], [address] [place], [place] [address]    Report Status 02/08/2019 FINAL   Urinalysis, Complete w Microscopic     Status: Abnormal   Collection Time: 02/03/19 11:22 AM  Result Value Ref Range   Color, Urine YELLOW (A) YELLOW   APPearance CLOUDY (A) CLEAR   Specific Gravity, Urine 1.014 1.005 - 1.030   pH 5.0 5.0 - 8.0   Glucose, UA NEGATIVE NEGATIVE mg/dL   Hgb urine dipstick SMALL (A) NEGATIVE   Bilirubin Urine NEGATIVE NEGATIVE   Ketones, ur 5 (A) NEGATIVE mg/dL   Protein, ur NEGATIVE NEGATIVE mg/dL   Nitrite NEGATIVE NEGATIVE   Leukocytes,Ua SMALL (A) NEGATIVE   RBC / HPF 6-10 0 - 5 RBC/hpf   WBC, UA 11-20 0 - 5 WBC/hpf   Bacteria, UA RARE (A) NONE SEEN   Squamous Epithelial / LPF 0-5 0 - 5   Mucus PRESENT    Hyaline Casts, UA PRESENT     Comment: Performed at [facility], [address] [place], [place] [address]  Urine  Culture     Status: Abnormal   Collection Time: 02/03/19 11:22 AM   Specimen: Urine, Random  Result Value Ref Range   Specimen Description      URINE, RANDOM Performed at [facility] [address] [place], [place] [address]    Special Requests      NONE  Performed at [facility], [address] [place], [place] [address]    Culture MULTIPLE SPECIES PRESENT, SUGGEST RECOLLECTION (A)    Report Status 02/04/2019 FINAL   Blood gas, arterial     Status: Abnormal   Collection Time: 02/03/19  1:07 PM  Result Value Ref Range   FIO2 0.28    Delivery systems BILEVEL POSITIVE AIRWAY PRESSURE    Inspiratory PAP 12    Expiratory PAP 6    pH, Arterial 7.44 7.350 - 7.450   pCO2 arterial 41 32.0 - 48.0 mmHg   pO2, Arterial 76 (L) 83.0 - 108.0 mmHg   Bicarbonate 27.8 20.0 - 28.0 mmol/L   Acid-Base Excess 3.3 (H) 0.0 - 2.0 mmol/L   O2 Saturation 95.6 %   Patient temperature 37.0    Collection site RIGHT RADIAL    Sample type ARTERIAL DRAW    Allens test (pass/fail) PASS PASS    Comment: Performed at [facility], [address] [place], [place] [address]  Glucose, capillary     Status: Abnormal   Collection Time: 02/03/19  2:38 PM  Result Value Ref Range   Glucose-Capillary 113 (H) 70 - 99 mg/dL  MRSA PCR Screening     Status: None   Collection Time: 02/03/19  2:51 PM   Specimen: Nasopharyngeal  Result Value Ref Range   MRSA by PCR NEGATIVE NEGATIVE    Comment:        The GeneXpert MRSA Assay (FDA approved for NASAL specimens only), is one component of a comprehensive MRSA colonization surveillance program. It is not intended to diagnose MRSA infection nor to guide or monitor treatment for MRSA infections. Performed at [facility], [place], [place] [address]   Troponin I (High Sensitivity)     Status: None   Collection Time: 02/03/19  3:10 PM  Result Value Ref Range   Troponin I (High Sensitivity) 14 <18 ng/L     Comment: (NOTE) Elevated high sensitivity troponin I (hsTnI) values and significant  changes across serial measurements may suggest ACS but many other  chronic and acute conditions are known to elevate hsTnI results.  Refer to the "Links" section for chest pain algorithms and additional  guidance. Performed at [facility], [address] [place], [place] [address]   CBC     Status: Abnormal   Collection Time: 02/03/19  3:10 PM  Result Value Ref Range   WBC 21.6 (H) 4.0 - 10.5 K/uL   RBC 4.64 3.87 - 5.11 MIL/uL   Hemoglobin 13.4 12.0 - 15.0 g/dL   HCT 42.3 36.0 - 46.0 %   MCV 91.2 80.0 - 100.0 fL   MCH 28.9 26.0 - 34.0 pg   MCHC 31.7 30.0 - 36.0 g/dL   RDW 15.5 11.5 - 15.5 %   Platelets 270 150 - 400 K/uL   nRBC 0.0 0.0 - 0.2 %    Comment: Performed at [facility], [address] [place], [place] [address]  Creatinine, serum     Status: None   Collection Time: 02/03/19  3:10 PM  Result Value Ref Range   Creatinine, Ser 0.86 0.44 - 1.00 mg/dL   GFR calc non Af Amer >60 >60 mL/min   GFR calc Af Amer >60 >60 mL/min    Comment: Performed at [facility], [address] [place], [place] [address]  Procalcitonin - Baseline     Status: None   Collection Time: 02/03/19  4:10 PM  Result  Value Ref Range   Procalcitonin <0.10 ng/mL    Comment:        Interpretation: PCT (Procalcitonin) <= 0.5 ng/mL: Systemic infection (sepsis) is not likely. Local bacterial infection is possible. (NOTE)       Sepsis PCT Algorithm           Lower Respiratory Tract                                      Infection PCT Algorithm    ----------------------------     ----------------------------         PCT < 0.25 ng/mL                PCT < 0.10 ng/mL         Strongly encourage             Strongly discourage   discontinuation of antibiotics    initiation of antibiotics    ----------------------------     -----------------------------       PCT 0.25 - 0.50 ng/mL             PCT 0.10 - 0.25 ng/mL               OR       >80% decrease in PCT            Discourage initiation of                                            antibiotics      Encourage discontinuation           of antibiotics    ----------------------------     -----------------------------         PCT >= 0.50 ng/mL              PCT 0.26 - 0.50 ng/mL               AND        <80% decrease in PCT             Encourage initiation of                                             antibiotics       Encourage continuation           of antibiotics    ----------------------------     -----------------------------        PCT >= 0.50 ng/mL                  PCT > 0.50 ng/mL               AND         increase in PCT                  Strongly encourage                                      initiation of antibiotics    Strongly encourage escalation  of antibiotics                                     -----------------------------                                           PCT <= 0.25 ng/mL                                                 OR                                        > 80% decrease in PCT                                     Discontinue / Do not initiate                                             antibiotics Performed at [facility], [address] [place], [place] [address]   ECHOCARDIOGRAM COMPLETE     Status: None   Collection Time: 02/03/19  7:28 PM  Result Value Ref Range   Weight 3,679.04 oz   Height 66 in   BP 147/63 mmHg  Basic metabolic panel     Status: Abnormal   Collection Time: 02/04/19  4:01 AM  Result Value Ref Range   Sodium 145 135 - 145 mmol/L   Potassium 3.0 (L) 3.5 - 5.1 mmol/L   Chloride 107 98 - 111 mmol/L   CO2 26 22 - 32 mmol/L   Glucose, Bld 106 (H) 70 - 99 mg/dL   BUN 23 8 - 23 mg/dL   Creatinine, Ser 1.00 0.44 - 1.00 mg/dL   Calcium 8.3 (L) 8.9 - 10.3 mg/dL   GFR calc non Af Amer 52 (L) >60 mL/min   GFR calc Af Amer >60 >60 mL/min   Anion gap  12 5 - 15    Comment: Performed at [facility], [address] [place], [place] [address]  CBC     Status: Abnormal   Collection Time: 02/04/19  4:01 AM  Result Value Ref Range   WBC 14.5 (H) 4.0 - 10.5 K/uL   RBC 4.08 3.87 - 5.11 MIL/uL   Hemoglobin 11.9 (L) 12.0 - 15.0 g/dL   HCT 37.4 36.0 - 46.0 %   MCV 91.7 80.0 - 100.0 fL   MCH 29.2 26.0 - 34.0 pg   MCHC 31.8 30.0 - 36.0 g/dL   RDW 15.7 (H) 11.5 - 15.5 %   Platelets 243 150 - 400 K/uL   nRBC 0.0 0.0 - 0.2 %    Comment: Performed at [facility], [address], [place],  [address]  Magnesium     Status: None   Collection Time: 02/04/19  4:01 AM  Result Value Ref Range   Magnesium 1.9 1.7 - 2.4 mg/dL  Comment: Performed at [facility], [address] [place], [place] [address]  Phosphorus     Status: None   Collection Time: 02/04/19  4:01 AM  Result Value Ref Range   Phosphorus 3.3 2.5 - 4.6 mg/dL    Comment: Performed at [facility], [address] [place], [place] [address]  Brain natriuretic peptide     Status: Abnormal   Collection Time: 02/04/19  4:01 AM  Result Value Ref Range   B Natriuretic Peptide 304.0 (H) 0.0 - 100.0 pg/mL    Comment: Performed at [facility], [address] [place], [place] [address]  Glucose, capillary     Status: Abnormal   Collection Time: 02/04/19 10:59 AM  Result Value Ref Range   Glucose-Capillary 131 (H) 70 - 99 mg/dL  CBC with Differential/Platelet     Status: Abnormal   Collection Time: 02/05/19  5:47 AM  Result Value Ref Range   WBC 9.4 4.0 - 10.5 K/uL   RBC 3.99 3.87 - 5.11 MIL/uL   Hemoglobin 11.5 (L) 12.0 - 15.0 g/dL   HCT 37.1 36.0 - 46.0 %   MCV 93.0 80.0 - 100.0 fL   MCH 28.8 26.0 - 34.0 pg   MCHC 31.0 30.0 - 36.0 g/dL   RDW 15.7 (H) 11.5 - 15.5 %   Platelets 234 150 - 400 K/uL   nRBC 0.0 0.0 - 0.2 %   Neutrophils Relative % 71 %   Neutro Abs 6.7 1.7 - 7.7 K/uL   Lymphocytes Relative 10 %   Lymphs Abs  0.9 0.7 - 4.0 K/uL   Monocytes Relative 10 %   Monocytes Absolute 0.9 0.1 - 1.0 K/uL   Eosinophils Relative 8 %   Eosinophils Absolute 0.8 (H) 0.0 - 0.5 K/uL   Basophils Relative 1 %   Basophils Absolute 0.1 0.0 - 0.1 K/uL   Immature Granulocytes 0 %   Abs Immature Granulocytes 0.04 0.00 - 0.07 K/uL    Comment: Performed at [facility], [address] [place], [place] [id number]  Basic metabolic panel     Status: Abnormal   Collection Time: 02/05/19  5:47 AM  Result Value Ref Range   Sodium 140 135 - 145 mmol/L   Potassium 3.3 (L) 3.5 - 5.1 mmol/L   Chloride 104 98 - 111 mmol/L   CO2 26 22 - 32 mmol/L   Glucose, Bld 103 (H) 70 - 99 mg/dL   BUN 30 (H) 8 - 23 mg/dL   Creatinine, Ser 0.88 0.44 - 1.00 mg/dL   Calcium 8.4 (L) 8.9 - 10.3 mg/dL   GFR calc non Af Amer >60 >60 mL/min   GFR calc Af Amer >60 >60 mL/min   Anion gap 10 5 - 15    Comment: Performed at [facility], [address] [place], [place] [address]  Lipid panel     Status: Abnormal   Collection Time: 02/05/19  5:47 AM  Result Value Ref Range   Cholesterol 196 0 - 200 mg/dL   Triglycerides 146 <150 mg/dL   HDL 39 (L) >40 mg/dL   Total CHOL/HDL Ratio 5.0 RATIO   VLDL 29 0 - 40 mg/dL   LDL Cholesterol 128 (H) 0 - 99 mg/dL    Comment:        Total Cholesterol/HDL:CHD Risk Coronary Heart Disease Risk Table                     Men   Women  1/2 Average Risk   3.4   3.3  Average Risk       5.0   4.4  2 X Average Risk   9.6   7.1  3 X Average Risk  23.4   11.0        Use the calculated Patient Ratio above and the CHD Risk Table to determine the patient's CHD Risk.        ATP III CLASSIFICATION (LDL):  <100     mg/dL   Optimal  100-129  mg/dL   Near or Above                    Optimal  130-159  mg/dL   Borderline  160-189  mg/dL   High  >190     mg/dL   Very High Performed at [facility], [address] [place], [place] [address]   Glucose, capillary     Status: None    Collection Time: 02/05/19  7:44 AM  Result Value Ref Range   Glucose-Capillary 89 70 - 99 mg/dL  POCT Activated clotting time     Status: None   Collection Time: 02/05/19  7:04 PM  Result Value Ref Range   Activated Clotting Time 158 seconds  POCT Activated clotting time     Status: None   Collection Time: 02/05/19  7:39 PM  Result Value Ref Range   Activated Clotting Time 169 seconds  MRSA PCR Screening     Status: None   Collection Time: 02/05/19 10:42 PM   Specimen: Nasal Mucosa; Nasopharyngeal  Result Value Ref Range   MRSA by PCR NEGATIVE NEGATIVE    Comment:        The GeneXpert MRSA Assay (FDA approved for NASAL specimens only), is one component of a comprehensive MRSA colonization surveillance program. It is not intended to diagnose MRSA infection nor to guide or monitor treatment for MRSA infections. Performed at [facility], [place] [address] [place], [place] [address]   Hemoglobin A1c     Status: None   Collection Time: 02/06/19  2:52 AM  Result Value Ref Range   Hgb A1c MFr Bld 5.6 4.8 - 5.6 %    Comment: (NOTE) Pre diabetes:          5.7%-6.4% Diabetes:              >6.4% Glycemic control for   <7.0% adults with diabetes    Mean Plasma Glucose 114.02 mg/dL    Comment: Performed at [facility] [address], [place], [place] [address]  Lipid panel     Status: Abnormal   Collection Time: 02/06/19  2:52 AM  Result Value Ref Range   Cholesterol 213 (H) 0 - 200 mg/dL   Triglycerides 152 (H) <150 mg/dL   HDL 44 >40 mg/dL   Total CHOL/HDL Ratio 4.8 RATIO   VLDL 30 0 - 40 mg/dL   LDL Cholesterol 139 (H) 0 - 99 mg/dL    Comment:        Total Cholesterol/HDL:CHD Risk Coronary Heart Disease Risk Table                     Men   Women  1/2 Average Risk   3.4   3.3  Average Risk       5.0   4.4  2 X Average Risk   9.6   7.1  3 X Average Risk  23.4   11.0  Use the calculated Patient Ratio above and the CHD Risk Table to determine the  patient's CHD Risk.        ATP III CLASSIFICATION (LDL):  <100     mg/dL   Optimal  100-129  mg/dL   Near or Above                    Optimal  130-159  mg/dL   Borderline  160-189  mg/dL   High  >190     mg/dL   Very High Performed at [facility] [address] [place], [place] [id number]   Basic metabolic panel     Status: Abnormal   Collection Time: 02/06/19  2:52 AM  Result Value Ref Range   Sodium 139 135 - 145 mmol/L   Potassium 4.1 3.5 - 5.1 mmol/L   Chloride 105 98 - 111 mmol/L   CO2 23 22 - 32 mmol/L   Glucose, Bld 163 (H) 70 - 99 mg/dL   BUN 21 8 - 23 mg/dL   Creatinine, Ser 0.98 0.44 - 1.00 mg/dL   Calcium 8.8 (L) 8.9 - 10.3 mg/dL   GFR calc non Af Amer 53 (L) >60 mL/min   GFR calc Af Amer >60 >60 mL/min   Anion gap 11 5 - 15    Comment: Performed at [facility], [place] [address], [place], [place] [address]  CBC with Differential/Platelet     Status: Abnormal   Collection Time: 02/06/19  2:52 AM  Result Value Ref Range   WBC 11.5 (H) 4.0 - 10.5 K/uL   RBC 4.21 3.87 - 5.11 MIL/uL   Hemoglobin 12.7 12.0 - 15.0 g/dL   HCT 39.0 36.0 - 46.0 %   MCV 92.6 80.0 - 100.0 fL   MCH 30.2 26.0 - 34.0 pg   MCHC 32.6 30.0 - 36.0 g/dL   RDW 15.6 (H) 11.5 - 15.5 %   Platelets 282 150 - 400 K/uL   nRBC 0.0 0.0 - 0.2 %   Neutrophils Relative % 93 %   Neutro Abs 10.6 (H) 1.7 - 7.7 K/uL   Lymphocytes Relative 4 %   Lymphs Abs 0.5 (L) 0.7 - 4.0 K/uL   Monocytes Relative 2 %   Monocytes Absolute 0.3 0.1 - 1.0 K/uL   Eosinophils Relative 0 %   Eosinophils Absolute 0.0 0.0 - 0.5 K/uL   Basophils Relative 0 %   Basophils Absolute 0.0 0.0 - 0.1 K/uL   Immature Granulocytes 1 %   Abs Immature Granulocytes 0.09 (H) 0.00 - 0.07 K/uL    Comment: Performed at [facility] [address], [place], [place] [id number]  Basic metabolic panel     Status: Abnormal   Collection Time: 02/07/19  5:07 AM  Result Value Ref Range   Sodium 141 135 - 145 mmol/L   Potassium 4.5  3.5 - 5.1 mmol/L   Chloride 110 98 - 111 mmol/L   CO2 22 22 - 32 mmol/L   Glucose, Bld 109 (H) 70 - 99 mg/dL   BUN 21 8 - 23 mg/dL   Creatinine, Ser 0.93 0.44 - 1.00 mg/dL   Calcium 8.6 (L) 8.9 - 10.3 mg/dL   GFR calc non Af Amer 57 (L) >60 mL/min   GFR calc Af Amer >60 >60 mL/min   Anion gap 9 5 - 15    Comment: Performed at [facility], [place] [address] [place], [place] [address]  Triglycerides  Status: Abnormal   Collection Time: 02/07/19  5:07 AM  Result Value Ref Range   Triglycerides 249 (H) <150 mg/dL    Comment: Performed at [facility] [address], [place], [place] [address]  Glucose, capillary     Status: None   Collection Time: 02/07/19 11:27 AM  Result Value Ref Range   Glucose-Capillary 82 70 - 99 mg/dL  Basic metabolic panel     Status: Abnormal   Collection Time: 02/08/19  8:14 AM  Result Value Ref Range   Sodium 140 135 - 145 mmol/L   Potassium 4.0 3.5 - 5.1 mmol/L   Chloride 107 98 - 111 mmol/L   CO2 21 (L) 22 - 32 mmol/L   Glucose, Bld 84 70 - 99 mg/dL   BUN 19 8 - 23 mg/dL   Creatinine, Ser 0.77 0.44 - 1.00 mg/dL   Calcium 8.7 (L) 8.9 - 10.3 mg/dL   GFR calc non Af Amer >60 >60 mL/min   GFR calc Af Amer >60 >60 mL/min   Anion gap 12 5 - 15    Comment: Performed at [facility], [place] [address] [place], [place] [id number]  Basic metabolic panel     Status: Abnormal   Collection Time: 02/09/19  4:06 AM  Result Value Ref Range   Sodium 138 135 - 145 mmol/L   Potassium 3.6 3.5 - 5.1 mmol/L   Chloride 105 98 - 111 mmol/L   CO2 22 22 - 32 mmol/L   Glucose, Bld 90 70 - 99 mg/dL   BUN 18 8 - 23 mg/dL   Creatinine, Ser 0.83 0.44 - 1.00 mg/dL   Calcium 8.5 (L) 8.9 - 10.3 mg/dL   GFR calc non Af Amer >60 >60 mL/min   GFR calc Af Amer >60 >60 mL/min   Anion gap 11 5 - 15    Comment: Performed at [facility], [place] [address] [place], [place] [address]  CBC     Status: None   Collection Time: 02/09/19  4:06 AM  Result Value Ref  Range   WBC 7.3 4.0 - 10.5 K/uL   RBC 4.30 3.87 - 5.11 MIL/uL   Hemoglobin 12.7 12.0 - 15.0 g/dL   HCT 39.6 36.0 - 46.0 %   MCV 92.1 80.0 - 100.0 fL   MCH 29.5 26.0 - 34.0 pg   MCHC 32.1 30.0 - 36.0 g/dL   RDW 15.3 11.5 - 15.5 %   Platelets 327 150 - 400 K/uL   nRBC 0.0 0.0 - 0.2 %    Comment: Performed at [facility], [place] [address], [place], [place] [address]  CBC     Status: None   Collection Time: 02/11/19  4:46 PM  Result Value Ref Range   WBC 9.6 4.0 - 10.5 K/uL   RBC 4.33 3.87 - 5.11 MIL/uL   Hemoglobin 13.0 12.0 - 15.0 g/dL   HCT 40.3 36.0 - 46.0 %   MCV 93.1 80.0 - 100.0 fL   MCH 30.0 26.0 - 34.0 pg   MCHC 32.3 30.0 - 36.0 g/dL   RDW 15.3 11.5 - 15.5 %   Platelets 337 150 - 400 K/uL   nRBC 0.0 0.0 - 0.2 %    Comment: Performed at [facility], [place] [address], [place], [place] [address]  Creatinine, serum     Status: Abnormal   Collection Time: 02/11/19  4:46 PM  Result Value Ref Range   Creatinine, Ser  0.96 0.44 - 1.00 mg/dL   GFR calc non Af Amer 55 (L) >60 mL/min   GFR calc Af Amer >60 >60 mL/min    Comment: Performed at [facility] [address] [place], [place] [address]  CBC WITH DIFFERENTIAL     Status: Abnormal   Collection Time: 02/14/19  7:48 AM  Result Value Ref Range   WBC 10.7 (H) 4.0 - 10.5 K/uL   RBC 4.85 3.87 - 5.11 MIL/uL   Hemoglobin 14.1 12.0 - 15.0 g/dL   HCT 44.5 36.0 - 46.0 %   MCV 91.8 80.0 - 100.0 fL   MCH 29.1 26.0 - 34.0 pg   MCHC 31.7 30.0 - 36.0 g/dL   RDW 15.7 (H) 11.5 - 15.5 %   Platelets 398 150 - 400 K/uL   nRBC 0.0 0.0 - 0.2 %   Neutrophils Relative % 69 %   Neutro Abs 7.3 1.7 - 7.7 K/uL   Lymphocytes Relative 14 %   Lymphs Abs 1.5 0.7 - 4.0 K/uL   Monocytes Relative 9 %   Monocytes Absolute 1.0 0.1 - 1.0 K/uL   Eosinophils Relative 5 %   Eosinophils Absolute 0.6 (H) 0.0 - 0.5 K/uL   Basophils Relative 1 %   Basophils Absolute 0.1 0.0 - 0.1 K/uL   Immature Granulocytes 2 %   Abs Immature  Granulocytes 0.18 (H) 0.00 - 0.07 K/uL    Comment: Performed at [facility], [address] [place], [place] [address]  Comprehensive metabolic panel     Status: Abnormal   Collection Time: 02/14/19  7:48 AM  Result Value Ref Range   Sodium 139 135 - 145 mmol/L   Potassium 4.2 3.5 - 5.1 mmol/L   Chloride 98 98 - 111 mmol/L   CO2 26 22 - 32 mmol/L   Glucose, Bld 105 (H) 70 - 99 mg/dL   BUN 18 8 - 23 mg/dL   Creatinine, Ser 1.03 (H) 0.44 - 1.00 mg/dL   Calcium 9.5 8.9 - 10.3 mg/dL   Total Protein 6.8 6.5 - 8.1 g/dL   Albumin 3.3 (L) 3.5 - 5.0 g/dL   AST 23 15 - 41 U/L   ALT 20 0 - 44 U/L   Alkaline Phosphatase 69 38 - 126 U/L   Total Bilirubin 0.9 0.3 - 1.2 mg/dL   GFR calc non Af Amer 50 (L) >60 mL/min   GFR calc Af Amer 58 (L) >60 mL/min   Anion gap 15 5 - 15    Comment: Performed at [facility] [address] [place], [place] [address]  CBC with Differential/Platelet     Status: Abnormal   Collection Time: 02/22/19  6:33 AM  Result Value Ref Range   WBC 7.7 4.0 - 10.5 K/uL   RBC 4.84 3.87 - 5.11 MIL/uL   Hemoglobin 14.4 12.0 - 15.0 g/dL   HCT 45.1 36.0 - 46.0 %   MCV 93.2 80.0 - 100.0 fL   MCH 29.8 26.0 - 34.0 pg   MCHC 31.9 30.0 - 36.0 g/dL   RDW 15.9 (H) 11.5 - 15.5 %   Platelets 339 150 - 400 K/uL   nRBC 0.0 0.0 - 0.2 %   Neutrophils Relative % 62 %   Neutro Abs 4.8 1.7 - 7.7 K/uL   Lymphocytes Relative 17 %   Lymphs Abs 1.3 0.7 - 4.0 K/uL   Monocytes Relative 10 %   Monocytes Absolute 0.8 0.1 - 1.0 K/uL  Eosinophils Relative 7 %   Eosinophils Absolute 0.5 0.0 - 0.5 K/uL   Basophils Relative 2 %   Basophils Absolute 0.1 0.0 - 0.1 K/uL   Immature Granulocytes 2 %   Abs Immature Granulocytes 0.16 (H) 0.00 - 0.07 K/uL    Comment: Performed at [facility] [address] [place], [place] [id number]  Basic metabolic panel     Status: Abnormal   Collection Time: 02/22/19  9:56 AM  Result Value Ref Range   Sodium 140 135 - 145 mmol/L    Potassium 4.7 3.5 - 5.1 mmol/L   Chloride 103 98 - 111 mmol/L   CO2 24 22 - 32 mmol/L   Glucose, Bld 135 (H) 70 - 99 mg/dL   BUN 44 (H) 8 - 23 mg/dL   Creatinine, Ser 2.17 (H) 0.44 - 1.00 mg/dL   Calcium 9.9 8.9 - 10.3 mg/dL   GFR calc non Af Amer 20 (L) >60 mL/min   GFR calc Af Amer 24 (L) >60 mL/min   Anion gap 13 5 - 15    Comment: Performed at [facility], [place] [address] [place], [place] [address]  Urinalysis, Routine w reflex microscopic     Status: None   Collection Time: 02/22/19 10:15 PM  Result Value Ref Range   Color, Urine YELLOW YELLOW   APPearance CLEAR CLEAR   Specific Gravity, Urine 1.014 1.005 - 1.030   pH 5.0 5.0 - 8.0   Glucose, UA NEGATIVE NEGATIVE mg/dL   Hgb urine dipstick NEGATIVE NEGATIVE   Bilirubin Urine NEGATIVE NEGATIVE   Ketones, ur NEGATIVE NEGATIVE mg/dL   Protein, ur NEGATIVE NEGATIVE mg/dL   Nitrite NEGATIVE NEGATIVE   Leukocytes,Ua NEGATIVE NEGATIVE    Comment: Performed at [facility] [address], [place], [place] [address]  Culture, Urine     Status: Abnormal   Collection Time: 02/22/19 10:51 PM   Specimen: Urine, Random  Result Value Ref Range   Specimen Description URINE, RANDOM    Special Requests      NONE Performed at [facility], [place] [address], [place], [place] [address]    Culture >=100,000 COLONIES/mL ENTEROCOCCUS FAECALIS (A)    Report Status 02/25/2019 FINAL    Organism ID, Bacteria ENTEROCOCCUS FAECALIS (A)       Susceptibility   Enterococcus faecalis - MIC*    AMPICILLIN <=2 SENSITIVE Sensitive     LEVOFLOXACIN 1 SENSITIVE Sensitive     NITROFURANTOIN <=16 SENSITIVE Sensitive     VANCOMYCIN 1 SENSITIVE Sensitive     * >=100,000 COLONIES/mL ENTEROCOCCUS FAECALIS  Basic metabolic panel     Status: Abnormal   Collection Time: 02/23/19 12:03 PM  Result Value Ref Range   Sodium 141 135 - 145 mmol/L   Potassium 4.3 3.5 - 5.1 mmol/L   Chloride 107 98 - 111 mmol/L   CO2 22 22 - 32 mmol/L   Glucose,  Bld 110 (H) 70 - 99 mg/dL   BUN 43 (H) 8 - 23 mg/dL   Creatinine, Ser 1.84 (H) 0.44 - 1.00 mg/dL   Calcium 9.2 8.9 - 10.3 mg/dL   GFR calc non Af Amer 25 (L) >60 mL/min   GFR calc Af Amer 29 (L) >60 mL/min   Anion gap 12 5 - 15    Comment: Performed at [facility], [place] [address], [place],  [id number]  Basic metabolic panel     Status: Abnormal   Collection Time: 02/24/19  5:32 AM  Result Value Ref Range   Sodium 143 135 - 145 mmol/L   Potassium 4.1 3.5 - 5.1 mmol/L   Chloride 111 98 - 111 mmol/L   CO2 24 22 - 32 mmol/L   Glucose, Bld 94 70 - 99 mg/dL   BUN 39 (H) 8 - 23 mg/dL   Creatinine, Ser 1.67 (H) 0.44 - 1.00 mg/dL   Calcium 8.9 8.9 - 10.3 mg/dL   GFR calc non Af Amer 28 (L) >60 mL/min   GFR calc Af Amer 32 (L) >60 mL/min   Anion gap 8 5 - 15    Comment: Performed at [facility] [address], [place], [place] [id number]  Basic metabolic panel     Status: Abnormal   Collection Time: 02/25/19  6:48 AM  Result Value Ref Range   Sodium 141 135 - 145 mmol/L   Potassium 4.1 3.5 - 5.1 mmol/L   Chloride 109 98 - 111 mmol/L   CO2 24 22 - 32 mmol/L   Glucose, Bld 91 70 - 99 mg/dL   BUN 27 (H) 8 - 23 mg/dL   Creatinine, Ser 1.31 (H) 0.44 - 1.00 mg/dL   Calcium 8.9 8.9 - 10.3 mg/dL   GFR calc non Af Amer 38 (L) >60 mL/min   GFR calc Af Amer 44 (L) >60 mL/min   Anion gap 8 5 - 15    Comment: Performed at [facility] [address] [place], [place] [address]  CBC with Differential/Platelet     Status: Abnormal   Collection Time: 02/26/19  6:04 AM  Result Value Ref Range   WBC 7.1 4.0 - 10.5 K/uL   RBC 4.24 3.87 - 5.11 MIL/uL   Hemoglobin 12.6 12.0 - 15.0 g/dL   HCT 40.1 36.0 - 46.0 %   MCV 94.6 80.0 - 100.0 fL   MCH 29.7 26.0 - 34.0 pg   MCHC 31.4 30.0 - 36.0 g/dL   RDW 16.0 (H) 11.5 - 15.5 %   Platelets 244 150 - 400 K/uL   nRBC 0.0 0.0 - 0.2 %   Neutrophils Relative % 58 %   Neutro Abs 4.1 1.7 - 7.7 K/uL   Lymphocytes Relative 19 %    Lymphs Abs 1.4 0.7 - 4.0 K/uL   Monocytes Relative 10 %   Monocytes Absolute 0.7 0.1 - 1.0 K/uL   Eosinophils Relative 11 %   Eosinophils Absolute 0.8 (H) 0.0 - 0.5 K/uL   Basophils Relative 1 %   Basophils Absolute 0.1 0.0 - 0.1 K/uL   Immature Granulocytes 1 %   Abs Immature Granulocytes 0.07 0.00 - 0.07 K/uL    Comment: Performed at [facility] [address] [place], [place] [address]  Urine Culture     Status: Abnormal   Collection Time: 02/26/19  3:55 PM   Specimen: Urine, Clean Catch  Result Value Ref Range   Specimen Description URINE, CLEAN CATCH    Special Requests NONE    Culture (A)     <10,000 COLONIES/mL INSIGNIFICANT GROWTH Performed at [facility], [place] [address] [place], [place] [address]    Report Status 02/27/2019 FINAL   TSH     Status: Abnormal   Collection Time: 02/28/19  7:51 AM  Result Value Ref Range   TSH 17.492 (H) 0.350 - 4.500 uIU/mL    Comment: Performed by a 3rd Generation assay with a functional sensitivity of <=0.01 uIU/mL. Performed at [facility], [address] [place], [place] [id number]   Basic metabolic panel     Status: Abnormal   Collection Time: 02/28/19  3:21 PM  Result Value Ref Range   Sodium 140 135 - 145 mmol/L   Potassium 4.2 3.5 - 5.1 mmol/L   Chloride 107 98 - 111 mmol/L   CO2 23 22 - 32 mmol/L   Glucose, Bld 133 (H) 70 - 99 mg/dL   BUN 13 8 - 23 mg/dL   Creatinine, Ser 1.18 (H) 0.44 - 1.00 mg/dL   Calcium 9.3 8.9 - 10.3 mg/dL   GFR calc non Af Amer 43 (L) >60 mL/min   GFR calc Af Amer 49 (L) >60 mL/min   Anion gap 10 5 - 15    Comment: Performed at [facility] [address] [place], [place] [id number]  Basic metabolic panel     Status: Abnormal   Collection Time: 03/01/19  5:02 AM  Result Value Ref Range   Sodium 142 135 - 145 mmol/L   Potassium 3.8 3.5 - 5.1 mmol/L   Chloride 109 98 - 111 mmol/L   CO2 23 22 - 32 mmol/L   Glucose, Bld 105 (H) 70 - 99 mg/dL   BUN 9 8 - 23 mg/dL    Creatinine, Ser 1.14 (H) 0.44 - 1.00 mg/dL   Calcium 9.4 8.9 - 10.3 mg/dL   GFR calc non Af Amer 44 (L) >60 mL/min   GFR calc Af Amer 51 (L) >60 mL/min   Anion gap 10 5 - 15    Comment: Performed at [facility] [address] [place], [place] [id number]  Basic metabolic panel     Status: Abnormal   Collection Time: 03/04/19  5:10 AM  Result Value Ref Range   Sodium 141 135 - 145 mmol/L   Potassium 3.8 3.5 - 5.1 mmol/L   Chloride 110 98 - 111 mmol/L   CO2 22 22 - 32 mmol/L   Glucose, Bld 81 70 - 99 mg/dL   BUN 13 8 - 23 mg/dL   Creatinine, Ser 1.00 0.44 - 1.00 mg/dL   Calcium 8.7 (L) 8.9 - 10.3 mg/dL   GFR calc non Af Amer 52 (L) >60 mL/min   GFR calc Af Amer >60 >60 mL/min   Anion gap 9 5 - 15    Comment: Performed at [facility], [place] [address] [place], [place] [address]  CBC with Differential/Platelet     Status: Abnormal   Collection Time: 03/07/19 12:59 PM  Result Value Ref Range   WBC 6.1 4.0 - 10.5 K/uL   RBC 4.40 3.87 - 5.11 MIL/uL   Hemoglobin 13.1 12.0 - 15.0 g/dL   HCT 42.5 36.0 - 46.0 %   MCV 96.6 80.0 - 100.0 fL   MCH 29.8 26.0 - 34.0 pg   MCHC 30.8 30.0 - 36.0 g/dL   RDW 17.2 (H) 11.5 - 15.5 %   Platelets 210 150 - 400 K/uL   nRBC 0.0 0.0 - 0.2 %   Neutrophils Relative % 57 %   Neutro Abs 3.5 1.7 - 7.7 K/uL   Lymphocytes Relative 22 %   Lymphs Abs 1.3 0.7 - 4.0 K/uL   Monocytes Relative 9 %   Monocytes Absolute 0.6 0.1 - 1.0 K/uL   Eosinophils Relative 9 %   Eosinophils Absolute 0.6 (H) 0.0 - 0.5 K/uL   Basophils Relative 1 %   Basophils Absolute 0.1 0.0 - 0.1 K/uL  Immature Granulocytes 2 %   Abs Immature Granulocytes 0.09 (H) 0.00 - 0.07 K/uL    Comment: Performed at [facility], [place] [address], [place], [place] [id number]  Basic metabolic panel     Status: Abnormal   Collection Time: 03/07/19 12:59 PM  Result Value Ref Range   Sodium 140 135 - 145 mmol/L   Potassium 4.3 3.5 - 5.1 mmol/L   Chloride 104 98 - 111 mmol/L   CO2 26  22 - 32 mmol/L   Glucose, Bld 92 70 - 99 mg/dL   BUN 17 8 - 23 mg/dL   Creatinine, Ser 1.23 (H) 0.44 - 1.00 mg/dL   Calcium 9.3 8.9 - 10.3 mg/dL   GFR calc non Af Amer 41 (L) >60 mL/min   GFR calc Af Amer 47 (L) >60 mL/min   Anion gap 10 5 - 15    Comment: Performed at [facility] [address] [place], [place] [address]  Comprehensive metabolic panel     Status: Abnormal   Collection Time: 03/28/19  2:57 PM  Result Value Ref Range   Sodium 140 135 - 145 mmol/L   Potassium 4.2 3.5 - 5.1 mmol/L    Comment: HEMOLYSIS AT THIS LEVEL MAY AFFECT RESULT   Chloride 103 98 - 111 mmol/L   CO2 25 22 - 32 mmol/L   Glucose, Bld 102 (H) 70 - 99 mg/dL   BUN 21 8 - 23 mg/dL   Creatinine, Ser 0.86 0.44 - 1.00 mg/dL   Calcium 9.6 8.9 - 10.3 mg/dL   Total Protein 7.2 6.5 - 8.1 g/dL   Albumin 3.7 3.5 - 5.0 g/dL   AST 21 15 - 41 U/L   ALT 10 0 - 44 U/L   Alkaline Phosphatase 97 38 - 126 U/L   Total Bilirubin 1.0 0.3 - 1.2 mg/dL   GFR calc non Af Amer >60 >60 mL/min   GFR calc Af Amer >60 >60 mL/min   Anion gap 12 5 - 15    Comment: Performed at [facility], [address] [place], [place] [address]  CBC with Differential     Status: Abnormal   Collection Time: 03/28/19  2:57 PM  Result Value Ref Range   WBC 9.5 4.0 - 10.5 K/uL   RBC 4.26 3.87 - 5.11 MIL/uL   Hemoglobin 12.6 12.0 - 15.0 g/dL   HCT 40.1 36.0 - 46.0 %   MCV 94.1 80.0 - 100.0 fL   MCH 29.6 26.0 - 34.0 pg   MCHC 31.4 30.0 - 36.0 g/dL   RDW 15.9 (H) 11.5 - 15.5 %   Platelets 277 150 - 400 K/uL   nRBC 0.0 0.0 - 0.2 %   Neutrophils Relative % 72 %   Neutro Abs 6.9 1.7 - 7.7 K/uL   Lymphocytes Relative 11 %   Lymphs Abs 1.1 0.7 - 4.0 K/uL   Monocytes Relative 10 %   Monocytes Absolute 0.9 0.1 - 1.0 K/uL   Eosinophils Relative 5 %   Eosinophils Absolute 0.5 0.0 - 0.5 K/uL   Basophils Relative 1 %   Basophils Absolute 0.1 0.0 - 0.1 K/uL   Immature Granulocytes 1 %   Abs Immature Granulocytes 0.11 (H) 0.00  - 0.07 K/uL    Comment: Performed at [facility], [address] [place],  [address]  Urinalysis, Complete w Microscopic     Status: Abnormal   Collection Time: 03/28/19  4:26 PM  Result Value Ref  Range   Color, Urine YELLOW (A) YELLOW   APPearance CLEAR (A) CLEAR   Specific Gravity, Urine 1.016 1.005 - 1.030   pH 5.0 5.0 - 8.0   Glucose, UA NEGATIVE NEGATIVE mg/dL   Hgb urine dipstick MODERATE (A) NEGATIVE   Bilirubin Urine NEGATIVE NEGATIVE   Ketones, ur NEGATIVE NEGATIVE mg/dL   Protein, ur NEGATIVE NEGATIVE mg/dL   Nitrite NEGATIVE NEGATIVE   Leukocytes,Ua NEGATIVE NEGATIVE   RBC / HPF >50 (H) 0 - 5 RBC/hpf   WBC, UA 0-5 0 - 5 WBC/hpf   Bacteria, UA NONE SEEN NONE SEEN   Squamous Epithelial / LPF 0-5 0 - 5    Comment: Performed at [facility], [address] [place], [place] [address]  Protime-INR     Status: None   Collection Time: 03/28/19  9:14 PM  Result Value Ref Range   Prothrombin Time 13.0 11.4 - 15.2 seconds   INR 1.0 0.8 - 1.2    Comment: (NOTE) INR goal varies based on device and disease states. Performed at [facility], [address] [place], [place] [address]   APTT     Status: None   Collection Time: 03/28/19  9:14 PM  Result Value Ref Range   aPTT 27 24 - 36 seconds    Comment: Performed at [facility], [place], [place] [address]  SARS CORONAVIRUS 2 (TAT 6-24 HRS) Nasopharyngeal Nasopharyngeal Swab     Status: None   Collection Time: 03/28/19  9:14 PM   Specimen: Nasopharyngeal Swab  Result Value Ref Range   SARS Coronavirus 2 NEGATIVE NEGATIVE    Comment: (NOTE) SARS-CoV-2 target nucleic acids are NOT DETECTED. The SARS-CoV-2 RNA is generally detectable in upper and lower respiratory specimens during the acute phase of infection. Negative results do not preclude SARS-CoV-2 infection, do not rule out co-infections with other pathogens, and should not be used as the sole basis for  treatment or other patient management decisions. Negative results must be combined with clinical observations, patient history, and epidemiological information. The expected result is Negative. Fact Sheet for Patients: SugarRoll.be Fact Sheet for Healthcare Providers: https://www.woods-mathews.com/ This test is not yet approved or cleared by the [place] FDA and  has been authorized for detection and/or diagnosis of SARS-CoV-2 by FDA under an Emergency Use Authorization (EUA). This EUA will remain  in effect (meaning this test can be used) for the duration of the COVID-19 declaration under Section 56 4(b)(1) of the Act, 21 U.S.C. section 360bbb-3(b)(1), unless the authorization is terminated or revoked sooner. Performed at [facility], [place] [address], [place], [place] [address]   Hemoglobin A1c     Status: None   Collection Time: 03/29/19  8:06 AM  Result Value Ref Range   Hgb A1c MFr Bld 5.5 4.8 - 5.6 %    Comment: (NOTE) Pre diabetes:          5.7%-6.4% Diabetes:              >6.4% Glycemic control for   <7.0% adults with diabetes    Mean Plasma Glucose 111.15 mg/dL    Comment: Performed at [facility] [address] [place], [place] [address]  Lipid panel     Status: None   Collection Time: 03/29/19  8:06 AM  Result Value Ref Range   Cholesterol 132 0 - 200 mg/dL   Triglycerides 127 <150 mg/dL   HDL 41 >40 mg/dL   Total CHOL/HDL  Ratio 3.2 RATIO   VLDL 25 0 - 40 mg/dL   LDL Cholesterol 66 0 - 99 mg/dL    Comment:        Total Cholesterol/HDL:CHD Risk Coronary Heart Disease Risk Table                     Men   Women  1/2 Average Risk   3.4   3.3  Average Risk       5.0   4.4  2 X Average Risk   9.6   7.1  3 X Average Risk  23.4   11.0        Use the calculated Patient Ratio above and the CHD Risk Table to determine the patient's CHD Risk.        ATP III CLASSIFICATION (LDL):  <100     mg/dL   Optimal   100-129  mg/dL   Near or Above                    Optimal  130-159  mg/dL   Borderline  160-189  mg/dL   High  >190     mg/dL   Very High Performed at [facility], [address] [place], [place] [address]   Comprehensive metabolic panel     Status: Abnormal   Collection Time: 03/29/19  8:06 AM  Result Value Ref Range   Sodium 139 135 - 145 mmol/L   Potassium 3.8 3.5 - 5.1 mmol/L   Chloride 105 98 - 111 mmol/L   CO2 26 22 - 32 mmol/L   Glucose, Bld 104 (H) 70 - 99 mg/dL   BUN 17 8 - 23 mg/dL   Creatinine, Ser 0.71 0.44 - 1.00 mg/dL   Calcium 9.1 8.9 - 10.3 mg/dL   Total Protein 6.4 (L) 6.5 - 8.1 g/dL   Albumin 3.3 (L) 3.5 - 5.0 g/dL   AST 12 (L) 15 - 41 U/L   ALT 8 0 - 44 U/L   Alkaline Phosphatase 79 38 - 126 U/L   Total Bilirubin 0.8 0.3 - 1.2 mg/dL   GFR calc non Af Amer >60 >60 mL/min   GFR calc Af Amer >60 >60 mL/min   Anion gap 8 5 - 15    Comment: Performed at [facility], [address] [place], [place] [address]  CBC     Status: Abnormal   Collection Time: 03/29/19  8:06 AM  Result Value Ref Range   WBC 7.5 4.0 - 10.5 K/uL   RBC 3.97 3.87 - 5.11 MIL/uL   Hemoglobin 11.7 (L) 12.0 - 15.0 g/dL   HCT 37.2 36.0 - 46.0 %   MCV 93.7 80.0 - 100.0 fL   MCH 29.5 26.0 - 34.0 pg   MCHC 31.5 30.0 - 36.0 g/dL   RDW 15.5 11.5 - 15.5 %   Platelets 225 150 - 400 K/uL   nRBC 0.0 0.0 - 0.2 %    Comment: Performed at [facility], [address] [place], [place] [address]  CBC     Status: None   Collection Time: 03/29/19  6:13 PM  Result Value Ref Range   WBC 8.1 4.0 - 10.5 K/uL   RBC 4.28 3.87 - 5.11 MIL/uL   Hemoglobin 13.0 12.0 - 15.0 g/dL   HCT 39.4 36.0 - 46.0 %   MCV 92.1 80.0 - 100.0 fL   MCH 30.4 26.0 - 34.0 pg   MCHC 33.0 30.0 -  36.0 g/dL   RDW 15.5 11.5 - 15.5 %   Platelets 227 150 - 400 K/uL   nRBC 0.0 0.0 - 0.2 %    Comment: Performed at [facility], [place] [address], [place], [place] [address]  Creatinine, serum      Status: None   Collection Time: 03/29/19  6:13 PM  Result Value Ref Range   Creatinine, Ser 0.77 0.44 - 1.00 mg/dL   GFR calc non Af Amer >60 >60 mL/min   GFR calc Af Amer >60 >60 mL/min    Comment: Performed at [place] [address], [place], [place] [address]  CBC     Status: None   Collection Time: 03/31/19  2:26 PM  Result Value Ref Range   WBC 8.3 4.0 - 10.5 K/uL   RBC 4.32 3.87 - 5.11 MIL/uL   Hemoglobin 13.0 12.0 - 15.0 g/dL   HCT 42.1 36.0 - 46.0 %   MCV 97.5 80.0 - 100.0 fL   MCH 30.1 26.0 - 34.0 pg   MCHC 30.9 30.0 - 36.0 g/dL   RDW 15.2 11.5 - 15.5 %   Platelets 262 150 - 400 K/uL   nRBC 0.0 0.0 - 0.2 %    Comment: Performed at [facility], [place] [address] [place], [place] [id number]  Basic metabolic panel     Status: Abnormal   Collection Time: 03/31/19  2:26 PM  Result Value Ref Range   Sodium 137 135 - 145 mmol/L   Potassium 3.5 3.5 - 5.1 mmol/L   Chloride 106 98 - 111 mmol/L   CO2 18 (L) 22 - 32 mmol/L   Glucose, Bld 85 70 - 99 mg/dL   BUN 12 8 - 23 mg/dL   Creatinine, Ser 0.69 0.44 - 1.00 mg/dL   Calcium 8.9 8.9 - 10.3 mg/dL   GFR calc non Af Amer >60 >60 mL/min   GFR calc Af Amer >60 >60 mL/min   Anion gap 13 5 - 15    Comment: Performed at [place] [address] [place], [place] [address]  Platelet inhibition p2y12 (Not at [facility])     Status: Abnormal   Collection Time: 03/31/19  6:07 PM  Result Value Ref Range   Platelet Function  P2Y12 68 (L) 182 - 335 PRU    Comment: Performed at [facility], [place] [address], [place], [place] [address]  Platelet inhibition p2y12 (Not at [facility])     Status: Abnormal   Collection Time: 04/01/19 12:09 PM  Result Value Ref Range   Platelet Function  P2Y12 66 (L) 182 - 335 PRU    Comment: (NOTE) The literature has shown a direct correlation of PRU values over 230 with higher risks of thrombotic events. Lower PRU values are associated with platelet inhibition. Performed at [facility], [place] [address] [place], [place] [address]   Glucose, capillary     Status: Abnormal   Collection Time: 04/02/19  4:43 PM  Result Value Ref Range   Glucose-Capillary 113 (H) 70 - 99 mg/dL  Platelet inhibition p2y12 (Not at [facility])     Status: Abnormal   Collection Time: 04/04/19 10:41 AM  Result Value Ref Range   Platelet Function  P2Y12 19 (L) 182 - 335 PRU    Comment: (NOTE) The literature has shown a direct correlation of PRU values over 230 with higher risks of thrombotic events. Lower PRU values are associated with platelet inhibition.  Performed at [facility], [place] [address], [place], [place] [address]   Surgical PCR screen     Status: Abnormal   Collection Time: 04/04/19  8:24 PM   Specimen: Nasal Mucosa; Nasal Swab  Result Value Ref Range   MRSA, PCR POSITIVE (A) NEGATIVE   Staphylococcus aureus POSITIVE (A) NEGATIVE    Comment: RESULT CALLED TO, READ BACK BY AND VERIFIED WITH: JS RN 04/04/19 2220 JDW (NOTE) The Xpert SA Assay (FDA approved for NASAL specimens in patients 31 years of age and older), is one component of a comprehensive surveillance program. It is not intended to diagnose infection nor to guide or monitor treatment. Performed at [facility], [place] [address], [place], [place] [address]   CBC with Differential/Platelet     Status: None   Collection Time: 04/05/19  3:07 AM  Result Value Ref Range   WBC 6.0 4.0 - 10.5 K/uL   RBC 4.24 3.87 - 5.11 MIL/uL   Hemoglobin 12.6 12.0 - 15.0 g/dL   HCT 39.2 36.0 - 46.0 %   MCV 92.5 80.0 - 100.0 fL   MCH 29.7 26.0 - 34.0 pg   MCHC 32.1 30.0 - 36.0 g/dL   RDW 15.1 11.5 - 15.5 %   Platelets 276 150 - 400 K/uL   nRBC 0.0 0.0 - 0.2 %   Neutrophils Relative % 57 %   Neutro Abs 3.4 1.7 - 7.7 K/uL   Lymphocytes Relative 22 %   Lymphs Abs 1.3 0.7 - 4.0 K/uL   Monocytes Relative 11 %   Monocytes Absolute 0.7 0.1 - 1.0 K/uL   Eosinophils Relative 8 %   Eosinophils Absolute 0.5 0.0 - 0.5 K/uL    Basophils Relative 1 %   Basophils Absolute 0.1 0.0 - 0.1 K/uL   Immature Granulocytes 1 %   Abs Immature Granulocytes 0.05 0.00 - 0.07 K/uL    Comment: Performed at [facility], [address] [place], [place] [address]  APTT     Status: None   Collection Time: 04/05/19  3:07 AM  Result Value Ref Range   aPTT 32 24 - 36 seconds    Comment: Performed at [facility] [address] [place] [address], [place], [place] [address]  Urinalysis, Complete w Microscopic     Status: Abnormal   Collection Time: 04/05/19  9:35 AM  Result Value Ref Range   Color, Urine YELLOW YELLOW   APPearance HAZY (A) CLEAR   Specific Gravity, Urine 1.012 1.005 - 1.030   pH 6.0 5.0 - 8.0   Glucose, UA NEGATIVE NEGATIVE mg/dL   Hgb urine dipstick NEGATIVE NEGATIVE   Bilirubin Urine NEGATIVE NEGATIVE   Ketones, ur NEGATIVE NEGATIVE mg/dL   Protein, ur NEGATIVE NEGATIVE mg/dL   Nitrite NEGATIVE NEGATIVE   Leukocytes,Ua NEGATIVE NEGATIVE   RBC / HPF 0-5 0 - 5 RBC/hpf   WBC, UA 0-5 0 - 5 WBC/hpf   Bacteria, UA RARE (A) NONE SEEN   Squamous Epithelial / LPF 0-5 0 - 5    Comment: Performed at [facility] [address], [place],  [address]  POCT Activated clotting time     Status: None   Collection Time: 04/06/19 10:38 AM  Result Value Ref Range   Activated Clotting Time 147 seconds  POCT Activated clotting time     Status: None   Collection Time: 04/06/19 11:07 AM  Result Value Ref Range   Activated Clotting Time 180 seconds  MRSA  PCR Screening     Status: None   Collection Time: 04/06/19  1:48 PM   Specimen: Nasal Mucosa; Nasopharyngeal  Result Value Ref Range   MRSA by PCR NEGATIVE NEGATIVE    Comment:        The GeneXpert MRSA Assay (FDA approved for NASAL specimens only), is one component of a comprehensive MRSA colonization surveillance program. It is not intended to diagnose MRSA infection nor to guide or monitor treatment for MRSA infections. Performed at [place], [place] [address], [place], [place] [address]   APTT     Status: Abnormal   Collection Time: 04/06/19  2:01 PM  Result Value Ref Range   aPTT 163 (HH) 24 - 36 seconds    Comment:        IF BASELINE aPTT IS ELEVATED, SUGGEST PATIENT RISK ASSESSMENT BE USED TO DETERMINE APPROPRIATE ANTICOAGULANT THERAPY. REPEATED TO VERIFY CRITICAL RESULT CALLED TO, READ BACK BY AND VERIFIED WITH: Nyoka Cowden [id number] 04/06/2019 WBOND Performed at [facility], [place] [address], [place], [place] [address]   Heparin level (unfractionated)     Status: Abnormal   Collection Time: 04/06/19  8:50 PM  Result Value Ref Range   Heparin Unfractionated 0.19 (L) 0.30 - 0.70 IU/mL    Comment: (NOTE) If heparin results are below expected values, and patient dosage has  been confirmed, suggest follow up testing of antithrombin III levels. Performed at [facility], [place] [address], [place], [place] [address]   CBC with Differential/Platelet     Status: Abnormal   Collection Time: 04/07/19  5:44 AM  Result Value Ref Range   WBC 8.8 4.0 - 10.5 K/uL   RBC 3.66 (L) 3.87 - 5.11 MIL/uL   Hemoglobin 11.0 (L) 12.0 - 15.0 g/dL   HCT 35.0 (L) 36.0 - 46.0 %   MCV 95.6 80.0 - 100.0 fL   MCH 30.1 26.0 - 34.0 pg   MCHC 31.4 30.0 - 36.0 g/dL   RDW 15.1 11.5 - 15.5 %   Platelets 293 150 - 400 K/uL   nRBC 0.0 0.0 - 0.2 %   Neutrophils Relative % 81 %   Neutro Abs 7.1 1.7 - 7.7 K/uL   Lymphocytes Relative 11 %   Lymphs Abs 0.9 0.7 - 4.0 K/uL   Monocytes Relative 7 %   Monocytes Absolute 0.6 0.1 - 1.0 K/uL   Eosinophils Relative 0 %   Eosinophils Absolute 0.0 0.0 - 0.5 K/uL   Basophils Relative 0 %   Basophils Absolute 0.0 0.0 - 0.1 K/uL   Immature Granulocytes 1 %   Abs Immature Granulocytes 0.07 0.00 - 0.07 K/uL    Comment: Performed at [facility] [address] [place], [place] [id number]  Basic metabolic panel     Status: Abnormal   Collection Time: 04/07/19  5:44 AM  Result Value Ref Range    Sodium 140 135 - 145 mmol/L   Potassium 3.7 3.5 - 5.1 mmol/L   Chloride 109 98 - 111 mmol/L   CO2 20 (L) 22 - 32 mmol/L   Glucose, Bld 117 (H) 70 - 99 mg/dL   BUN 20 8 - 23 mg/dL   Creatinine, Ser 0.90 0.44 - 1.00 mg/dL   Calcium 8.2 (L) 8.9 - 10.3 mg/dL   GFR calc non Af Amer 59 (L) >60 mL/min   GFR calc Af Amer >60 >60 mL/min   Anion gap 11 5 - 15  Comment: Performed at [facility], [place] [address], [place], [place] [address]  SARS CORONAVIRUS 2 (TAT 6-24 HRS) Nasopharyngeal Nasopharyngeal Swab     Status: None   Collection Time: 04/09/19  3:19 PM   Specimen: Nasopharyngeal Swab  Result Value Ref Range   SARS Coronavirus 2 NEGATIVE NEGATIVE    Comment: (NOTE) SARS-CoV-2 target nucleic acids are NOT DETECTED. The SARS-CoV-2 RNA is generally detectable in upper and lower respiratory specimens during the acute phase of infection. Negative results do not preclude SARS-CoV-2 infection, do not rule out co-infections with other pathogens, and should not be used as the sole basis for treatment or other patient management decisions. Negative results must be combined with clinical observations, patient history, and epidemiological information. The expected result is Negative. Fact Sheet for Patients: SugarRoll.be Fact Sheet for Healthcare Providers: https://www.woods-mathews.com/ This test is not yet approved or cleared by the [place] FDA and  has been authorized for detection and/or diagnosis of SARS-CoV-2 by FDA under an Emergency Use Authorization (EUA). This EUA will remain  in effect (meaning this test can be used) for the duration of the COVID-19 declaration under Section 56 4(b)(1) of the Act, 21 U.S.C. section 360bbb-3(b)(1), unless the authorization is terminated or revoked sooner. Performed at [facility], [place] [address], [place], [place] [address]   CBC with Differential     Status: Abnormal   Collection Time:  04/23/19  8:16 PM  Result Value Ref Range   WBC 12.5 (H) 4.0 - 10.5 K/uL   RBC 4.63 3.87 - 5.11 MIL/uL   Hemoglobin 13.5 12.0 - 15.0 g/dL   HCT 41.7 36.0 - 46.0 %   MCV 90.1 80.0 - 100.0 fL   MCH 29.2 26.0 - 34.0 pg   MCHC 32.4 30.0 - 36.0 g/dL   RDW 15.4 11.5 - 15.5 %   Platelets 441 (H) 150 - 400 K/uL   nRBC 0.0 0.0 - 0.2 %   Neutrophils Relative % 79 %   Neutro Abs 10.0 (H) 1.7 - 7.7 K/uL   Lymphocytes Relative 8 %   Lymphs Abs 1.0 0.7 - 4.0 K/uL   Monocytes Relative 8 %   Monocytes Absolute 1.0 0.1 - 1.0 K/uL   Eosinophils Relative 3 %   Eosinophils Absolute 0.4 0.0 - 0.5 K/uL   Basophils Relative 1 %   Basophils Absolute 0.1 0.0 - 0.1 K/uL   Immature Granulocytes 1 %   Abs Immature Granulocytes 0.10 (H) 0.00 - 0.07 K/uL    Comment: Performed at [facility], [address] [place], [place] [id number]  Basic metabolic panel     Status: Abnormal   Collection Time: 04/23/19  8:16 PM  Result Value Ref Range   Sodium 144 135 - 145 mmol/L   Potassium 3.9 3.5 - 5.1 mmol/L   Chloride 108 98 - 111 mmol/L   CO2 25 22 - 32 mmol/L   Glucose, Bld 125 (H) 70 - 99 mg/dL   BUN 43 (H) 8 - 23 mg/dL   Creatinine, Ser 1.20 (H) 0.44 - 1.00 mg/dL   Calcium 9.3 8.9 - 10.3 mg/dL   GFR calc non Af Amer 42 (L) >60 mL/min   GFR calc Af Amer 48 (L) >60 mL/min   Anion gap 11 5 - 15    Comment: Performed at [facility], [address] [place], [place] [address]  Urinalysis, Complete w Microscopic     Status: Abnormal   Collection Time: 04/23/19  8:55 PM  Result Value Ref Range   Color, Urine AMBER (A) YELLOW    Comment: BIOCHEMICALS MAY BE AFFECTED BY COLOR   APPearance TURBID (A) CLEAR   Specific Gravity, Urine 1.015 1.005 - 1.030   pH 8.0 5.0 - 8.0   Glucose, UA NEGATIVE NEGATIVE mg/dL   Hgb urine dipstick NEGATIVE NEGATIVE   Bilirubin Urine NEGATIVE NEGATIVE   Ketones, ur NEGATIVE NEGATIVE mg/dL   Protein, ur >=300 (A) NEGATIVE mg/dL   Nitrite NEGATIVE NEGATIVE    Leukocytes,Ua MODERATE (A) NEGATIVE   RBC / HPF >50 (H) 0 - 5 RBC/hpf   WBC, UA >50 (H) 0 - 5 WBC/hpf   Bacteria, UA FEW (A) NONE SEEN   Squamous Epithelial / LPF 0-5 0 - 5   WBC Clumps PRESENT     Comment: Performed at [facility], [address] [place], [place] [address]  Urine culture     Status: Abnormal   Collection Time: 04/23/19  8:55 PM   Specimen: Urine, Catheterized  Result Value Ref Range   Specimen Description      URINE, CATHETERIZED Performed at [facility], [address] [place], [place] [address]    Special Requests      NONE Performed at [facility], [place], [place] [address]    Culture (A)     >=100,000 COLONIES/mL MORGANELLA MORGANII >=100,000 COLONIES/mL VIRIDANS STREPTOCOCCUS Standardized susceptibility testing for this organism is not available. Performed at [facility], [place] [address], [place], [place] [address]    Report Status 04/27/2019 FINAL    Organism ID, Bacteria MORGANELLA MORGANII (A)       Susceptibility   Morganella morganii - MIC*    AMPICILLIN >=32 RESISTANT Resistant     CEFAZOLIN >=64 RESISTANT Resistant     CEFTRIAXONE <=1 SENSITIVE Sensitive     CIPROFLOXACIN <=0.25 SENSITIVE Sensitive     GENTAMICIN <=1 SENSITIVE Sensitive     IMIPENEM 4 SENSITIVE Sensitive     NITROFURANTOIN 128 RESISTANT Resistant     TRIMETH/SULFA <=20 SENSITIVE Sensitive     AMPICILLIN/SULBACTAM >=32 RESISTANT Resistant     PIP/TAZO <=4 SENSITIVE Sensitive     * >=100,000 COLONIES/mL MORGANELLA MORGANII    Radiology: CT Head Wo Contrast  Result Date: 04/23/2019 CLINICAL DATA:  Un witnessed fall with left eyebrow laceration, initial encounter EXAM: CT HEAD WITHOUT CONTRAST CT CERVICAL SPINE WITHOUT CONTRAST TECHNIQUE: Multidetector CT imaging of the head and cervical spine was performed following the standard protocol without intravenous contrast. Multiplanar CT image reconstructions of the cervical  spine were also generated. COMPARISON:  04/06/2019 FINDINGS: CT HEAD FINDINGS Brain: Chronic atrophic and ischemic changes are again seen and stable. Some decreased attenuation is noted in the subinsular region on the right new from the prior exam likely related to the prior ischemic event. Right MCA stent is noted in place. The previously seen hemorrhage in the right basal ganglia has resolved. No new infarct or acute hemorrhage is seen. Vascular: Right MCA stent is noted stable in appearance from the prior exam. Skull: Normal. Negative for fracture or focal lesion. Sinuses/Orbits: No acute finding. Other: Soft tissue swelling is noted over the left orbit consistent with the recent injury. No underlying fracture is seen. CT CERVICAL SPINE FINDINGS Alignment: Within normal limits. Skull base and vertebrae: 7 cervical segments are well visualized. Vertebral body height is well maintained. Mild osteophytes are noted in the cervical spine. More marked osteophytes are noted in the  upper thoracic spine. No acute fracture or acute facet abnormality is noted. Facet hypertrophic changes are seen particularly on the right. Soft tissues and spinal canal: The surrounding soft tissues demonstrate vascular calcifications. No mass lesion is seen. No focal hematoma is noted. Upper chest: Visualized lung apices are within normal limits. Other: None IMPRESSION: CT of the head: Status post right MCA stent placement. New areas of decreased attenuation are noted in the subinsular region on the right consistent with the previous subacute ischemia. No acute infarct is noted. Previously seen parenchymal hemorrhage has resolved in the interval. CT of the cervical spine: Multilevel degenerative change without acute abnormality. Electronically Signed   By: Inez Catalina M.D.   On: 04/23/2019 20:17   CT Cervical Spine Wo Contrast  Result Date: 04/23/2019 CLINICAL DATA:  Un witnessed fall with left eyebrow laceration, initial encounter  EXAM: CT HEAD WITHOUT CONTRAST CT CERVICAL SPINE WITHOUT CONTRAST TECHNIQUE: Multidetector CT imaging of the head and cervical spine was performed following the standard protocol without intravenous contrast. Multiplanar CT image reconstructions of the cervical spine were also generated. COMPARISON:  04/06/2019 FINDINGS: CT HEAD FINDINGS Brain: Chronic atrophic and ischemic changes are again seen and stable. Some decreased attenuation is noted in the subinsular region on the right new from the prior exam likely related to the prior ischemic event. Right MCA stent is noted in place. The previously seen hemorrhage in the right basal ganglia has resolved. No new infarct or acute hemorrhage is seen. Vascular: Right MCA stent is noted stable in appearance from the prior exam. Skull: Normal. Negative for fracture or focal lesion. Sinuses/Orbits: No acute finding. Other: Soft tissue swelling is noted over the left orbit consistent with the recent injury. No underlying fracture is seen. CT CERVICAL SPINE FINDINGS Alignment: Within normal limits. Skull base and vertebrae: 7 cervical segments are well visualized. Vertebral body height is well maintained. Mild osteophytes are noted in the cervical spine. More marked osteophytes are noted in the upper thoracic spine. No acute fracture or acute facet abnormality is noted. Facet hypertrophic changes are seen particularly on the right. Soft tissues and spinal canal: The surrounding soft tissues demonstrate vascular calcifications. No mass lesion is seen. No focal hematoma is noted. Upper chest: Visualized lung apices are within normal limits. Other: None IMPRESSION: CT of the head: Status post right MCA stent placement. New areas of decreased attenuation are noted in the subinsular region on the right consistent with the previous subacute ischemia. No acute infarct is noted. Previously seen parenchymal hemorrhage has resolved in the interval. CT of the cervical spine: Multilevel  degenerative change without acute abnormality. Electronically Signed   By: Inez Catalina M.D.   On: 04/23/2019 20:17    No results found.  CT Head Wo Contrast  Result Date: 04/23/2019 CLINICAL DATA:  Un witnessed fall with left eyebrow laceration, initial encounter EXAM: CT HEAD WITHOUT CONTRAST CT CERVICAL SPINE WITHOUT CONTRAST TECHNIQUE: Multidetector CT imaging of the head and cervical spine was performed following the standard protocol without intravenous contrast. Multiplanar CT image reconstructions of the cervical spine were also generated. COMPARISON:  04/06/2019 FINDINGS: CT HEAD FINDINGS Brain: Chronic atrophic and ischemic changes are again seen and stable. Some decreased attenuation is noted in the subinsular region on the right new from the prior exam likely related to the prior ischemic event. Right MCA stent is noted in place. The previously seen hemorrhage in the right basal ganglia has resolved. No new infarct or acute hemorrhage is seen.  Vascular: Right MCA stent is noted stable in appearance from the prior exam. Skull: Normal. Negative for fracture or focal lesion. Sinuses/Orbits: No acute finding. Other: Soft tissue swelling is noted over the left orbit consistent with the recent injury. No underlying fracture is seen. CT CERVICAL SPINE FINDINGS Alignment: Within normal limits. Skull base and vertebrae: 7 cervical segments are well visualized. Vertebral body height is well maintained. Mild osteophytes are noted in the cervical spine. More marked osteophytes are noted in the upper thoracic spine. No acute fracture or acute facet abnormality is noted. Facet hypertrophic changes are seen particularly on the right. Soft tissues and spinal canal: The surrounding soft tissues demonstrate vascular calcifications. No mass lesion is seen. No focal hematoma is noted. Upper chest: Visualized lung apices are within normal limits. Other: None IMPRESSION: CT of the head: Status post right MCA stent  placement. New areas of decreased attenuation are noted in the subinsular region on the right consistent with the previous subacute ischemia. No acute infarct is noted. Previously seen parenchymal hemorrhage has resolved in the interval. CT of the cervical spine: Multilevel degenerative change without acute abnormality. Electronically Signed   By: Inez Catalina M.D.   On: 04/23/2019 20:17   CT HEAD WO CONTRAST  Result Date: 04/06/2019 CLINICAL DATA:  Severe headache. Altered mental status. Right MCA stent placed 04/06/2019 EXAM: CT HEAD WITHOUT CONTRAST TECHNIQUE: Contiguous axial images were obtained from the base of the skull through the vertex without intravenous contrast. COMPARISON:  CT head 03/28/2019 FINDINGS: Brain: Interval placement of right MCA stent in good position. Small area of acute hemorrhage in the right basal ganglia measuring approximately 5 mm. Smaller additional areas of acute hemorrhage also in the right basal ganglia. No subarachnoid hemorrhage. Moderate ventricular enlargement with atrophy unchanged. Chronic ischemic changes in the white matter bilaterally. Vascular: Right MCA stent in good position. Negative for hyperdense vessel Skull: Negative Sinuses/Orbits: Negative Other: Motion degraded study IMPRESSION: Right MCA stent placed today. Small areas of acute hemorrhage in the right basal ganglia have developed since prior studies and appear acute. Atrophy and chronic ischemia.  No acute ischemic infarction. Electronically Signed   By: Franchot Gallo M.D.   On: 04/06/2019 12:18   CT Cervical Spine Wo Contrast  Result Date: 04/23/2019 CLINICAL DATA:  Un witnessed fall with left eyebrow laceration, initial encounter EXAM: CT HEAD WITHOUT CONTRAST CT CERVICAL SPINE WITHOUT CONTRAST TECHNIQUE: Multidetector CT imaging of the head and cervical spine was performed following the standard protocol without intravenous contrast. Multiplanar CT image reconstructions of the cervical spine  were also generated. COMPARISON:  04/06/2019 FINDINGS: CT HEAD FINDINGS Brain: Chronic atrophic and ischemic changes are again seen and stable. Some decreased attenuation is noted in the subinsular region on the right new from the prior exam likely related to the prior ischemic event. Right MCA stent is noted in place. The previously seen hemorrhage in the right basal ganglia has resolved. No new infarct or acute hemorrhage is seen. Vascular: Right MCA stent is noted stable in appearance from the prior exam. Skull: Normal. Negative for fracture or focal lesion. Sinuses/Orbits: No acute finding. Other: Soft tissue swelling is noted over the left orbit consistent with the recent injury. No underlying fracture is seen. CT CERVICAL SPINE FINDINGS Alignment: Within normal limits. Skull base and vertebrae: 7 cervical segments are well visualized. Vertebral body height is well maintained. Mild osteophytes are noted in the cervical spine. More marked osteophytes are noted in the upper thoracic spine. No  acute fracture or acute facet abnormality is noted. Facet hypertrophic changes are seen particularly on the right. Soft tissues and spinal canal: The surrounding soft tissues demonstrate vascular calcifications. No mass lesion is seen. No focal hematoma is noted. Upper chest: Visualized lung apices are within normal limits. Other: None IMPRESSION: CT of the head: Status post right MCA stent placement. New areas of decreased attenuation are noted in the subinsular region on the right consistent with the previous subacute ischemia. No acute infarct is noted. Previously seen parenchymal hemorrhage has resolved in the interval. CT of the cervical spine: Multilevel degenerative change without acute abnormality. Electronically Signed   By: Inez Catalina M.D.   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 EXAM: INTRACRANIAL STENT  (INCL PTA) COMPARISON:  Diagnostic catheter arteriogram of March 31, 2019. MEDICATIONS: Heparin 4,000 units IV; Ancef 2 g IV antibiotic was administered within 1 hour of the procedure. ANESTHESIA/SEDATION: General anesthesia. CONTRAST:  Isovue 300 approximately 75 mL. FLUOROSCOPY TIME:  Fluoroscopy Time: 15 minutes 18 seconds (2032 mGy). COMPLICATIONS: None immediate. TECHNIQUE: Informed written consent was obtained from the patient after a thorough discussion of the procedural risks, benefits and alternatives. All questions were addressed. Maximal Sterile Barrier Technique was utilized including caps, mask, sterile gowns, sterile gloves, sterile drape, hand hygiene and skin antiseptic. A timeout was performed prior to the initiation of the procedure. The right groin was prepped and draped in the usual sterile fashion. Thereafter using modified Seldinger technique, transfemoral access into the right common femoral artery was obtained without difficulty. Over a 0.035 inch guidewire, a 5 French Pinnacle sheath was inserted. Through this, and also over 0.035 inch guidewire, a 5 [place] JB 1 catheter was advanced to the aortic arch region and selectively positioned in innominate artery, and the right common carotid artery. FINDINGS: The innominate arteriogram demonstrates the origin of the right subclavian artery and the right common carotid artery to be widely patent. The right common carotid arteriogram demonstrates the right external carotid artery and its major branches to be widely patent. The right internal carotid artery at the bulb has a smooth shallow plaque along the posterior wall without evidence of ulcerations or of intraluminal filling defects associated with approximately 10% stenosis by the NASCET criteria. More distally the right internal carotid artery is seen to opacify to the cranial skull base. The petrous, cavernous and the supraclinoid segments are widely patent. Mild atherosclerotic  irregularity is seen in the caval cavernous segment of the right internal carotid artery. The right posterior communicating artery is seen to opacify the right posterior cerebral artery distribution. The right middle cerebral artery and just distal to its origin demonstrated diffuse severe 95% stenosis approaching the right MCA trifurcation branches. Slow antegrade flow seen in the right middle cerebral artery distribution compressing the right anterior cerebral artery which opacifies normally into the capillary and venous phases. Prompt cross filling via the anterior communicating artery of the left anterior cerebral A2 segment and distally is seen. ENDOVASCULAR REVASCULARIZATION OF THE SEVERE SYMPTOMATIC RIGHT MIDDLE CEREBRAL ARTERY STENOSIS WITH STENT ASSISTED ANGIOPLASTY The diagnostic JB 1 catheter in the right common carotid artery was exchanged over a 0.035 inch 300 cm Rosen exchange guidewire for an 8 French 80 cm Neuron Max sheath. The guidewire was removed. Good aspiration obtained from the hub of the Neuron Max sheath. Gentle contrast injection demonstrated no evidence of spasms, dissections or of  intraluminal filling defects. Over a 0.035 Roadrunner guidewire, a 5 French 115 cm Catalyst guide catheter was then advanced to the horizontal petrous segment of the right internal carotid artery. The guidewire was removed. Good aspiration was obtained from the hub of the 5 [place] Catalyst guide catheter. A control arteriogram performed through this demonstrated no change in the intracranial circulation. The Neuron Max sheath was then advanced to the proximal right internal carotid artery at this time. Measurements were performed of the right middle cerebral artery and the most normal distant and proximal segments. It was elected to proceed first with balloon angioplasty with a 2 mm x 15 mm Gateway balloon microcatheter. This was then prepped and purged with 75% contrast and 25% heparinized saline infusion  followed by advancement over a 0.014 inch standard Synchro micro guidewire with a J configuration to avoid spasm or induce dissection. The micro guidewire was then gently manipulated with a torque device with the Catalyst guide catheter and the caval cavernous segment of the right internal carotid artery across the severe stenosis of the right middle cerebral artery into the distal M2 M3 region of the inferior division. Thereafter, slow advancement of the Gateway balloon was then advanced and positioned optimally across the stenosis of the right middle cerebral artery such that the distal marker and the proximal markers were in the near normal segments. Thereafter control inflation of the balloon was performed using micro inflation syringe device via micro tubing. Slow increments of atmospheres was performed until balloon had been inflated to approximately 2.3 atmospheres where it was maintained for approximately 90 seconds. Balloon was then deflated. A control arteriogram performed through the Catalyst guide catheter in the right internal carotid artery demonstrated significantly improved caliber and flow through the angioplastied segment. The balloon was then advanced into the M2 region of the inferior division. Another control arteriogram through the Catalyst guide catheter in the significantly improved caliber and flow through the angioplastied segment. Balloon in the M2 M3 region of the inferior division of the right middle cerebral artery was then replaced with a 300 cm 014 inch [place] exchange micro guidewire. The distal end of the wire had a J configuration to avoid dissections or inducing spasm. A 3.5 mm x 20 mm Wingspan stent apparatus was then prepped and purged with heparinized saline infusion through the delivery microcatheter, and also the inner core. The stent was gently advanced with the inner core and was then slightly retrieved. This then mounted on the exchange micro guidewire and advanced with  the distal end of the exchange micro guidewire in a stable position. The microcatheter with the stent was then advanced such that the distal marker covered the distal aspect of the middle cerebral artery just proximal to the trifurcation. Once there, the O ring on the delivery microcatheter was then loosened. With gentle constant pressure on the proximal stent with the inner core, the microcatheter was retrieved unsheathing distally and then the proximal portion of the stent. The combination of the wire and the delivery apparatus was then retrieved and removed. Control arteriogram performed through the 5 [place] Catalyst guide catheter in the right internal carotid artery demonstrated excellent apposition of the stent across the high-grade stenosis with excellent improved flow through the right MCA distribution. No evidence of extravasation or mass-effect was noted. The patient's neurological status and hemodynamic status remained stable. A control arteriogram was then performed at 15 and 35 minutes post Wingspan stent deployment. These continued to demonstrate excellent flow through the stented segment  into the right MCA distribution having achieved a TICI 3 revascularization. A final control arteriogram performed through the Neuron Max sheath in the right common carotid artery following removal of the 5 [place] Catalyst guide catheter demonstrated excellent flow through the extracranial and intracranial internal carotid artery, and also the right middle cerebral artery and the right and anterior cerebral artery distributions. The patient's ACT was maintained in the region of approximately 180 seconds throughout the procedure. The Neuron Max sheath was then removed with the placement of an 8 French Angio-Seal closure device with hemostasis in the right groin puncture site. The patient's distal pulses remained Dopplerable in the dorsalis pedis, and the posterior tibial arteries bilaterally unchanged from prior to the  procedure. The patient was then extubated without difficulty. Upon recovery, the patient demonstrated slow recovery of the left arm motor function. The patient also complained of a moderate headache with nausea. This prompted a CT of the brain which demonstrated no gross hemorrhages, mass-effect or midline shift. There was a question of a mild hyperdensity in the anterior right perisylvian area which was felt to represent combination of possible contrast stain versus less likely a micro hemorrhage. However, the patient remained stable. Over the next 30 minutes, the patient's left arm and left leg function returned to the preprocedure levels. The patient was alert, awake and oriented with a right groin appearing soft. She was then transferred to the neuro ICU where she remained neurologically stable enough. The IV heparin was stopped and the patient was switched to aspirin 81 mg a day, and Brilinta 90 mg b.i.d. The patient's right groin appeared soft with distal pulses being Dopplerable bilaterally unchanged. The patient was then transferred to the admitting team. IMPRESSION: Status post endovascular revascularization of severely symptomatic stenosis of the right middle cerebral artery with stent assisted angioplasty and placement of a 3.5 mm x 20 mm Wingspan stent system. PLAN: Follow-up in the clinic 4 weeks post discharge. Electronically Signed   By: [name] M.D.   On: 04/08/2019 10:47      Assessment and Plan: Patient Active Problem List   Diagnosis Date Noted  . CVA (cerebral vascular accident) ([place]) 03/29/2019  . Ischemic stroke ([place]) 03/29/2019  . Acute CVA (cerebrovascular accident) ([place]) 03/28/2019  . Sleep disturbance   . Benign essential HTN   . Acute lower UTI   . Pseudodementia   . Labile blood pressure   . Metabolic encephalopathy   . Hyperglycemia   . Encephalopathy   . AKI (acute kidney injury) ([place])   . Leukocytosis   . Dysphagia, post-stroke   . Acute bilat watershed  infarction [facility]) 02/11/2019  . Agitation requiring sedation protocol 02/09/2019  . Dysphagia 02/09/2019  . Stroke (cerebrum) ([place]) - R watershed d/t large vessel dz & small L temp lobe & R cerebellar infarcts 02/05/2019  . Middle cerebral artery stenosis, right 02/05/2019  . Acute respiratory failure ([place]) 02/03/2019  . Acute respiratory failure with hypoxemia ([place]) 02/03/2019  . Abnormal renal function 01/19/2019  . Pain in left foot 12/19/2018  . Hematuria 08/02/2018  . Influenza A 03/02/2018  . Urinary tract infection without hematuria 03/02/2018  . Sepsis ([place]) 02/11/2018  . CAP (community acquired pneumonia) 02/11/2018  . CKD (chronic kidney disease), stage III 02/11/2018  . Memory loss, short term 02/10/2018  . Flu vaccine need 02/10/2018  . Chest pain 02/03/2018  . Acute upper respiratory infection 02/03/2018  . Gastroesophageal reflux disease without esophagitis 02/03/2018  . Encounter for general adult  medical examination with abnormal findings 01/13/2018  . Chronic low back pain 01/13/2018  . Need for vaccination against Streptococcus pneumoniae using pneumococcal conjugate vaccine 7 01/13/2018  . Dysuria 01/13/2018  . 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 Pelvic pain 10/08/2017  . Gout 06/15/2017  . Mixed hyperlipidemia 06/15/2017  . Inflammatory polyarthropathy ([place]) 06/15/2017  . Neuralgia and neuritis, unspecified 06/15/2017  . Major depressive disorder, recurrent, mild ([place]) 06/15/2017  . Other vitamin B12 deficiency anemias 06/15/2017  . Hypothyroidism 10/17/2008  . OVERWEIGHT/OBESITY 10/17/2008  . SLEEP APNEA, OBSTRUCTIVE 10/17/2008  . Essential hypertension 10/17/2008  . GALLSTONES 10/17/2008  . DYSPNEA 10/17/2008  . Obesity 10/17/2008    1. Pulmonary nodule, left Pts daughter does not wish to continue with CT for pulmonary nodule.  She understands the need, but does not feel like it is important at this time given her mothers decline.   She agrees that if things improve, she will let us know and we will re-order the CT. She will call office to schedule pulmonary follow up as needed.     2. OSA (obstructive sleep apnea) Does not wear cpap. Unable to tolerate.  General Counseling: I have discussed the findings of the evaluation and examination with [facility].  I have also discussed any further diagnostic evaluation thatmay be needed or ordered today. Donya verbalizes understanding of the findings of todays visit. We also reviewed her medications today and discussed drug interactions and side effects including but not limited excessive drowsiness and altered mental states. We also discussed that there is always a risk not just to her but also people around her. she has been encouraged to call the office with any questions or concerns that should arise related to todays visit.  No orders of the defined types were placed in this encounter.    Time spent: 25 This patient was seen by Orson Gear AGNP-C in Collaboration with Dr. Devona Konig as a part of collaborative care agreement.   I have personally obtained a history, examined the patient, evaluated laboratory and imaging results, formulated the assessment and plan and placed orders.    Allyne Gee, MD [facility] Pulmonary and Critical Care Sleep medicine

## 2019-05-09 DIAGNOSIS — R569 Unspecified convulsions: Secondary | ICD-10-CM | POA: Diagnosis not present

## 2019-05-09 DIAGNOSIS — I6389 Other cerebral infarction: Secondary | ICD-10-CM | POA: Diagnosis not present

## 2019-05-09 DIAGNOSIS — G9340 Encephalopathy, unspecified: Secondary | ICD-10-CM | POA: Diagnosis not present

## 2019-05-10 ENCOUNTER — Other Ambulatory Visit: Payer: Self-pay

## 2019-05-10 ENCOUNTER — Telehealth (HOSPITAL_COMMUNITY): Payer: Self-pay

## 2019-05-10 ENCOUNTER — Ambulatory Visit (HOSPITAL_COMMUNITY)
Admission: RE | Admit: 2019-05-10 | Discharge: 2019-05-10 | Disposition: A | Discharge status: Home / Self Care | Payer: Medicare Other | Source: Ambulatory Visit | Attending: Physician Assistant | Admitting: Physician Assistant

## 2019-05-10 DIAGNOSIS — Z1159 Encounter for screening for other viral diseases: Secondary | ICD-10-CM | POA: Diagnosis not present

## 2019-05-10 DIAGNOSIS — I6601 Occlusion and stenosis of right middle cerebral artery: Secondary | ICD-10-CM

## 2019-05-10 NOTE — Progress Notes (Signed)
Chief Complaint: Patient was seen in consultation today for right MCA M1 segment stenosis s/p revascularization.  Referring Physician(s): Kerney Elbe  Supervising Physician: [name]  Patient Status: [facility] - Out-pt  History of Present Illness: Christy Carter is a 85 y.o. female with a past medical history as below, with pertinent past medical history including hypertension, hyperlipidemia, CVA 01/2019, thyroid disease, arthritis, and depression. She is known to [facility] and has been followed by Dr. Estanislado Pandy since 01/2019. She first presented to our department at the request of Dr. Cheral Marker for management of right MCA M1 segment stenosis, thought to be cause of CVA 01/2019. She underwent an image-guided cerebral arteriogram with revascularization of right MCA stenosis using balloon angioplasty 02/05/2019 by Dr. Estanislado Pandy. In 03/2019, she presented to [facility] ED with stroke-like symptoms and was found to have recurrent stenosis of right MCA M1 segment. She then underwent an image-guided cerebral arteriogram with revascularization of right MCA M1 segment stenosis using stent assisted angioplasty 04/06/2019 by Dr. Estanislado Pandy. She was discharged to [facility] 04/11/2019 in stable condition.  Patient presents today for follow-up regarding her recent procedure 04/06/2019. Patient awake and alert sitting in wheelchair. Accompanied by CNA from her SNF. Complains of left-sided weakness, improved since discharge. States she sees PT for this. Denies headache, numbness/tingling, dizziness, vision changes, hearing changes, tinnitus, or speech difficulty.  Currently taking Brilinta 90 mg twice daily and Aspirin 81 mg once daily.   Past Medical History:  Diagnosis Date  . Arthritis   . Carpal tunnel syndrome   . Complication of anesthesia    Difficulty waking up. Believes it took 2 days to wake up after her surgery.  . Depression   . Hyperlipidemia   . Hypertension   . Shortness of breath   . Stroke ([place])    . Thyroid disease     Past Surgical History:  Procedure Laterality Date  . APPENDECTOMY    . BACK SURGERY    . CHOLECYSTECTOMY    . HERNIA REPAIR    . IR ANGIO INTRA EXTRACRAN SEL COM CAROTID INNOMINATE BILAT MOD SED  03/31/2019  . IR ANGIO VERTEBRAL SEL SUBCLAVIAN INNOMINATE UNI R MOD SED  03/31/2019  . IR CT HEAD LTD  02/05/2019  . IR INTRA CRAN STENT  04/06/2019  . IR PTA INTRACRANIAL  02/05/2019  . IR US GUIDE VASC ACCESS RIGHT  03/31/2019  . RADIOLOGY WITH ANESTHESIA N/A 02/05/2019   Procedure: CODE STROKE;  Surgeon: Radiologist, Medication, MD;  Location: [facility];  Service: Radiology;  Laterality: N/A;  . RADIOLOGY WITH ANESTHESIA N/A 04/06/2019   Procedure: STENTING;  Surgeon: [name], MD;  Location: [facility];  Service: Radiology;  Laterality: N/A;    Allergies: Patient has no known allergies.  Medications: Prior to Admission medications   Medication Sig Start Date End Date Taking? Authorizing Provider  acetaminophen (TYLENOL) 325 MG tablet Take 2 tablets (650 mg total) by mouth every 4 (four) hours as needed for mild pain (or temp > 37.5 C (99.5 F)). 03/08/19   Angiulli, Lavon Paganini, PA-C  allopurinol (ZYLOPRIM) 100 MG tablet Take 1 tablet (100 mg total) by mouth daily. 04/11/19   Shawna Clamp, MD  amLODipine (NORVASC) 10 MG tablet Take 1 tablet (10 mg total) by mouth daily. 04/11/19   Shawna Clamp, MD  aspirin EC 81 MG EC tablet Take 1 tablet (81 mg total) by mouth daily. 02/11/19   Donzetta Starch, NP  atorvastatin (LIPITOR) 40 MG tablet Take 1 tablet (40 mg  total) by mouth daily at 6 PM. 03/08/19   Angiulli, Lavon Paganini, PA-C  bisoprolol (ZEBETA) 5 MG tablet Take 0.5 tablets (2.5 mg total) by mouth daily. 03/08/19   Angiulli, Lavon Paganini, PA-C  cloNIDine (CATAPRES - DOSED IN MG/24 HR) 0.1 mg/24hr patch Place 1 patch (0.1 mg total) onto the skin every Monday. 03/14/19   Angiulli, Lavon Paganini, PA-C  Cyanocobalamin (B-12) 2500 MCG TABS Take 2,500 mcg by mouth daily as needed (For  Lethargy).     [provider]  diclofenac sodium (VOLTAREN) 1 % GEL Apply 4 g topically 4 (four) times daily. Patient taking differently: Apply 4 g topically 4 (four) times daily as needed (For pain).  03/08/19   Angiulli, Lavon Paganini, PA-C  FLUoxetine (PROZAC) 10 MG capsule Take 1 capsule (10 mg total) by mouth daily. 03/08/19   Angiulli, Lavon Paganini, PA-C  levothyroxine (SYNTHROID) 150 MCG tablet Take 1 tablet (150 mcg total) by mouth daily before breakfast. 03/08/19   Angiulli, Lavon Paganini, PA-C  omeprazole (PRILOSEC) 40 MG capsule Take 1 capsule (40 mg total) by mouth daily. 03/08/19   Angiulli, Lavon Paganini, PA-C  QUEtiapine (SEROQUEL) 25 MG tablet Take 1 tablet (25 mg total) by mouth at bedtime. 03/08/19   Angiulli, Lavon Paganini, PA-C  ticagrelor (BRILINTA) 90 MG TABS tablet Take 1 tablet (90 mg total) by mouth 2 (two) times daily. 03/08/19   Angiulli, Lavon Paganini, PA-C  VENTOLIN HFA 108 (90 Base) MCG/ACT inhaler TAKE 2 PUFFS BY MOUTH EVERY 6 HOURS AS NEEDED FOR WHEEZE OR SHORTNESS OF BREATH Patient taking differently: Inhale 2 puffs into the lungs every 6 (six) hours as needed for shortness of breath.  03/19/18   Ronnell Freshwater, NP  Vitamin D, Ergocalciferol, (DRISDOL) 1.25 MG (50000 UT) CAPS capsule TAKE 1 CAPSULE (50,000 UNITS TOTAL) BY MOUTH EVERY 7 (SEVEN) DAYS. 05/02/19   Lavera Guise, MD     Family History  Problem Relation Age of Onset  . Heart disease Mother   . Hypertension Mother   . Diverticulitis Mother   . Heart disease Father   . Hypertension Father     Social History   Socioeconomic History  . Marital status: Widowed    Spouse name: Not on file  . Number of children: Not on file  . Years of education: Not on file  . Highest education level: Not on file  Occupational History  . Not on file  Tobacco Use  . Smoking status: Former Smoker    Types: Cigarettes    Quit date: 08/15/1992    Years since quitting: 26.7  . Smokeless tobacco: Never Used  Substance and Sexual  Activity  . Alcohol use: No  . Drug use: No  . Sexual activity: Not Currently  Other Topics Concern  . Not on file  Social History Narrative  . Not on file   Social Determinants of Health   Financial Resource Strain:   . Difficulty of Paying Living Expenses: Not on file  Food Insecurity:   . Worried About Charity fundraiser in the Last Year: Not on file  . Ran Out of Food in the Last Year: Not on file  Transportation Needs:   . Lack of Transportation (Medical): Not on file  . Lack of Transportation (Non-Medical): Not on file  Physical Activity:   . Days of Exercise per Week: Not on file  . Minutes of Exercise per Session: Not on file  Stress:   . Feeling of Stress :  Not on file  Social Connections:   . Frequency of Communication with Friends and Family: Not on file  . Frequency of Social Gatherings with Friends and Family: Not on file  . Attends Religious Services: Not on file  . Active Member of Clubs or Organizations: Not on file  . Attends Archivist Meetings: Not on file  . Marital Status: Not on file     Review of Systems: A 12 point ROS discussed and pertinent positives are indicated in the HPI above.  All other systems are negative.  Review of Systems  Constitutional: Negative for chills and fever.  HENT: Negative for hearing loss and tinnitus.   Eyes: Negative for visual disturbance.  Respiratory: Negative for shortness of breath and wheezing.   Cardiovascular: Negative for chest pain and palpitations.  Neurological: Positive for weakness. Negative for dizziness, speech difficulty, numbness and headaches.  Psychiatric/Behavioral: Negative for behavioral problems and confusion.    Physical Exam Constitutional:      General: She is not in acute distress.    Appearance: Normal appearance.  Pulmonary:     Effort: Pulmonary effort is normal. No respiratory distress.  Skin:    General: Skin is warm and dry.  Neurological:     Mental Status: She is  alert and oriented to person, place, and time.     Comments: Alert and oriented x3. No pronator drift. Can spontaneously move all extremities with mild left sided weakness.  Psychiatric:        Mood and Affect: Mood normal.        Behavior: Behavior normal.      Imaging: CT Head Wo Contrast  Result Date: 04/23/2019 CLINICAL DATA:  Un witnessed fall with left eyebrow laceration, initial encounter EXAM: CT HEAD WITHOUT CONTRAST CT CERVICAL SPINE WITHOUT CONTRAST TECHNIQUE: Multidetector CT imaging of the head and cervical spine was performed following the standard protocol without intravenous contrast. Multiplanar CT image reconstructions of the cervical spine were also generated. COMPARISON:  04/06/2019 FINDINGS: CT HEAD FINDINGS Brain: Chronic atrophic and ischemic changes are again seen and stable. Some decreased attenuation is noted in the subinsular region on the right new from the prior exam likely related to the prior ischemic event. Right MCA stent is noted in place. The previously seen hemorrhage in the right basal ganglia has resolved. No new infarct or acute hemorrhage is seen. Vascular: Right MCA stent is noted stable in appearance from the prior exam. Skull: Normal. Negative for fracture or focal lesion. Sinuses/Orbits: No acute finding. Other: Soft tissue swelling is noted over the left orbit consistent with the recent injury. No underlying fracture is seen. CT CERVICAL SPINE FINDINGS Alignment: Within normal limits. Skull base and vertebrae: 7 cervical segments are well visualized. Vertebral body height is well maintained. Mild osteophytes are noted in the cervical spine. More marked osteophytes are noted in the upper thoracic spine. No acute fracture or acute facet abnormality is noted. Facet hypertrophic changes are seen particularly on the right. Soft tissues and spinal canal: The surrounding soft tissues demonstrate vascular calcifications. No mass lesion is seen. No focal hematoma is  noted. Upper chest: Visualized lung apices are within normal limits. Other: None IMPRESSION: CT of the head: Status post right MCA stent placement. New areas of decreased attenuation are noted in the subinsular region on the right consistent with the previous subacute ischemia. No acute infarct is noted. Previously seen parenchymal hemorrhage has resolved in the interval. CT of the cervical spine: Multilevel degenerative change  without acute abnormality. Electronically Signed   By: Inez Catalina M.D.   On: 04/23/2019 20:17   CT Cervical Spine Wo Contrast  Result Date: 04/23/2019 CLINICAL DATA:  Un witnessed fall with left eyebrow laceration, initial encounter EXAM: CT HEAD WITHOUT CONTRAST CT CERVICAL SPINE WITHOUT CONTRAST TECHNIQUE: Multidetector CT imaging of the head and cervical spine was performed following the standard protocol without intravenous contrast. Multiplanar CT image reconstructions of the cervical spine were also generated. COMPARISON:  04/06/2019 FINDINGS: CT HEAD FINDINGS Brain: Chronic atrophic and ischemic changes are again seen and stable. Some decreased attenuation is noted in the subinsular region on the right new from the prior exam likely related to the prior ischemic event. Right MCA stent is noted in place. The previously seen hemorrhage in the right basal ganglia has resolved. No new infarct or acute hemorrhage is seen. Vascular: Right MCA stent is noted stable in appearance from the prior exam. Skull: Normal. Negative for fracture or focal lesion. Sinuses/Orbits: No acute finding. Other: Soft tissue swelling is noted over the left orbit consistent with the recent injury. No underlying fracture is seen. CT CERVICAL SPINE FINDINGS Alignment: Within normal limits. Skull base and vertebrae: 7 cervical segments are well visualized. Vertebral body height is well maintained. Mild osteophytes are noted in the cervical spine. More marked osteophytes are noted in the upper thoracic spine.  No acute fracture or acute facet abnormality is noted. Facet hypertrophic changes are seen particularly on the right. Soft tissues and spinal canal: The surrounding soft tissues demonstrate vascular calcifications. No mass lesion is seen. No focal hematoma is noted. Upper chest: Visualized lung apices are within normal limits. Other: None IMPRESSION: CT of the head: Status post right MCA stent placement. New areas of decreased attenuation are noted in the subinsular region on the right consistent with the previous subacute ischemia. No acute infarct is noted. Previously seen parenchymal hemorrhage has resolved in the interval. CT of the cervical spine: Multilevel degenerative change without acute abnormality. Electronically Signed   By: Inez Catalina M.D.   On: 04/23/2019 20:17    Labs:  CBC: Recent Labs    03/31/19 1426 04/05/19 0307 04/07/19 0544 04/23/19 2016  WBC 8.3 6.0 8.8 12.5*  HGB 13.0 12.6 11.0* 13.5  HCT 42.1 39.2 35.0* 41.7  PLT 262 276 293 441*    COAGS: Recent Labs    03/28/19 2114 04/05/19 0307 04/06/19 1401  INR 1.0  --   --   APTT 27 32 163*    BMP: Recent Labs    03/29/19 0806 03/29/19 1813 03/31/19 1426 04/07/19 0544 04/23/19 2016  NA 139  --  137 140 144  K 3.8  --  3.5 3.7 3.9  CL 105  --  106 109 108  CO2 26  --  18* 20* 25  GLUCOSE 104*  --  85 117* 125*  BUN 17  --  12 20 43*  CALCIUM 9.1  --  8.9 8.2* 9.3  CREATININE 0.71 0.77 0.69 0.90 1.20*  GFRNONAA >60 >60 >60 59* 42*  GFRAA >60 >60 >60 >60 48*    LIVER FUNCTION TESTS: Recent Labs    11/25/18 1046 02/14/19 0748 03/28/19 1457 03/29/19 0806  BILITOT 0.2 0.9 1.0 0.8  AST 14 23 21  12*  ALT 12 20 10 8   ALKPHOS 90 69 97 79  PROT 6.5 6.8 7.2 6.4*  ALBUMIN 3.9 3.3* 3.7 3.3*     Assessment and Plan:  Right MCA M1 segment s/p  revascularization using balloon angioplasty 02/05/2019 by Dr. Estanislado Pandy; with recurrent right MCA M1 segment stenosis s/p revascularization using stent assisted  angioplasty 04/06/2019 by Dr. Estanislado Pandy. Dr. Estanislado Pandy was present for consultation. Discussed patient's ADLs. Patient states that she feeds herself and bathes herself, however she needs assistance with ambulation. States that she walks with walker when ambulating (although this is only a few times a day, otherwise uses wheelchair). Recommended patient continue PT. Discussed right MCA stenosis. Explained the best way to monitor her stenosis is with routine imaging scans to monitor for changes.  Plan for follow-up with CTA head/neck (with contrast) 3 months from procedure 04/06/2019. Informed patient that our schedulers will call her to set up this imaging scan. Instructed patient to continue taking Brilinta 90 mg twice daily and Aspirin 81 mg once daily.  All questions answered and concerns addressed. Patient conveys understanding and agrees with plan.  Thank you for this interesting consult.  I greatly enjoyed meeting ALBERT NESSETH and look forward to participating in their care.  A copy of this report was sent to the requesting provider on this date.  Electronically Signed: Earley Abide, PA-C 05/10/2019, 12:54 PM   I spent a total of 40 Minutes in face to face in clinical consultation, greater than 50% of which was counseling/coordinating care for right MCA M1 segment stenosis s/p revascularization.

## 2019-05-10 NOTE — Telephone Encounter (Signed)
Called [facility] to have pt come in earlier. Left vm for transportation to return call. AW

## 2019-05-11 DIAGNOSIS — I69354 Hemiplegia and hemiparesis following cerebral infarction affecting left non-dominant side: Secondary | ICD-10-CM | POA: Diagnosis not present

## 2019-05-11 DIAGNOSIS — E785 Hyperlipidemia, unspecified: Secondary | ICD-10-CM | POA: Diagnosis not present

## 2019-05-11 DIAGNOSIS — E039 Hypothyroidism, unspecified: Secondary | ICD-10-CM | POA: Diagnosis not present

## 2019-05-12 DIAGNOSIS — G459 Transient cerebral ischemic attack, unspecified: Secondary | ICD-10-CM | POA: Diagnosis not present

## 2019-05-12 DIAGNOSIS — R262 Difficulty in walking, not elsewhere classified: Secondary | ICD-10-CM | POA: Diagnosis not present

## 2019-05-12 DIAGNOSIS — M1000 Idiopathic gout, unspecified site: Secondary | ICD-10-CM | POA: Diagnosis not present

## 2019-05-12 DIAGNOSIS — E039 Hypothyroidism, unspecified: Secondary | ICD-10-CM | POA: Diagnosis not present

## 2019-05-12 DIAGNOSIS — R52 Pain, unspecified: Secondary | ICD-10-CM | POA: Diagnosis not present

## 2019-05-12 DIAGNOSIS — D518 Other vitamin B12 deficiency anemias: Secondary | ICD-10-CM | POA: Diagnosis not present

## 2019-05-12 DIAGNOSIS — I10 Essential (primary) hypertension: Secondary | ICD-10-CM | POA: Diagnosis not present

## 2019-05-12 DIAGNOSIS — I639 Cerebral infarction, unspecified: Secondary | ICD-10-CM | POA: Diagnosis not present

## 2019-05-12 DIAGNOSIS — K219 Gastro-esophageal reflux disease without esophagitis: Secondary | ICD-10-CM | POA: Diagnosis not present

## 2019-05-12 DIAGNOSIS — I69328 Other speech and language deficits following cerebral infarction: Secondary | ICD-10-CM | POA: Diagnosis not present

## 2019-05-12 DIAGNOSIS — R2681 Unsteadiness on feet: Secondary | ICD-10-CM | POA: Diagnosis not present

## 2019-05-12 DIAGNOSIS — E569 Vitamin deficiency, unspecified: Secondary | ICD-10-CM | POA: Diagnosis not present

## 2019-05-12 DIAGNOSIS — E785 Hyperlipidemia, unspecified: Secondary | ICD-10-CM | POA: Diagnosis not present

## 2019-05-12 DIAGNOSIS — I6350 Cerebral infarction due to unspecified occlusion or stenosis of unspecified cerebral artery: Secondary | ICD-10-CM | POA: Diagnosis not present

## 2019-05-13 DIAGNOSIS — R1312 Dysphagia, oropharyngeal phase: Secondary | ICD-10-CM | POA: Diagnosis not present

## 2019-05-13 DIAGNOSIS — E039 Hypothyroidism, unspecified: Secondary | ICD-10-CM | POA: Diagnosis not present

## 2019-05-13 DIAGNOSIS — I69391 Dysphagia following cerebral infarction: Secondary | ICD-10-CM | POA: Diagnosis not present

## 2019-05-13 DIAGNOSIS — R52 Pain, unspecified: Secondary | ICD-10-CM | POA: Diagnosis not present

## 2019-05-13 DIAGNOSIS — K219 Gastro-esophageal reflux disease without esophagitis: Secondary | ICD-10-CM | POA: Diagnosis not present

## 2019-05-13 DIAGNOSIS — M1000 Idiopathic gout, unspecified site: Secondary | ICD-10-CM | POA: Diagnosis not present

## 2019-05-13 DIAGNOSIS — G459 Transient cerebral ischemic attack, unspecified: Secondary | ICD-10-CM | POA: Diagnosis not present

## 2019-05-13 DIAGNOSIS — I6350 Cerebral infarction due to unspecified occlusion or stenosis of unspecified cerebral artery: Secondary | ICD-10-CM | POA: Diagnosis not present

## 2019-05-13 DIAGNOSIS — D518 Other vitamin B12 deficiency anemias: Secondary | ICD-10-CM | POA: Diagnosis not present

## 2019-05-13 DIAGNOSIS — I69328 Other speech and language deficits following cerebral infarction: Secondary | ICD-10-CM | POA: Diagnosis not present

## 2019-05-13 DIAGNOSIS — E569 Vitamin deficiency, unspecified: Secondary | ICD-10-CM | POA: Diagnosis not present

## 2019-05-13 DIAGNOSIS — E785 Hyperlipidemia, unspecified: Secondary | ICD-10-CM | POA: Diagnosis not present

## 2019-05-13 DIAGNOSIS — I10 Essential (primary) hypertension: Secondary | ICD-10-CM | POA: Diagnosis not present

## 2019-05-17 ENCOUNTER — Other Ambulatory Visit: Payer: Self-pay

## 2019-05-17 DIAGNOSIS — I69328 Other speech and language deficits following cerebral infarction: Secondary | ICD-10-CM | POA: Diagnosis not present

## 2019-05-17 DIAGNOSIS — I10 Essential (primary) hypertension: Secondary | ICD-10-CM | POA: Diagnosis not present

## 2019-05-17 DIAGNOSIS — G459 Transient cerebral ischemic attack, unspecified: Secondary | ICD-10-CM | POA: Diagnosis not present

## 2019-05-17 DIAGNOSIS — E569 Vitamin deficiency, unspecified: Secondary | ICD-10-CM | POA: Diagnosis not present

## 2019-05-17 DIAGNOSIS — E039 Hypothyroidism, unspecified: Secondary | ICD-10-CM | POA: Diagnosis not present

## 2019-05-17 DIAGNOSIS — I69959 Hemiplegia and hemiparesis following unspecified cerebrovascular disease affecting unspecified side: Secondary | ICD-10-CM | POA: Diagnosis not present

## 2019-05-17 DIAGNOSIS — Z1159 Encounter for screening for other viral diseases: Secondary | ICD-10-CM | POA: Diagnosis not present

## 2019-05-17 DIAGNOSIS — I69391 Dysphagia following cerebral infarction: Secondary | ICD-10-CM | POA: Diagnosis not present

## 2019-05-17 DIAGNOSIS — K219 Gastro-esophageal reflux disease without esophagitis: Secondary | ICD-10-CM | POA: Diagnosis not present

## 2019-05-17 DIAGNOSIS — I6350 Cerebral infarction due to unspecified occlusion or stenosis of unspecified cerebral artery: Secondary | ICD-10-CM | POA: Diagnosis not present

## 2019-05-17 DIAGNOSIS — E785 Hyperlipidemia, unspecified: Secondary | ICD-10-CM | POA: Diagnosis not present

## 2019-05-17 DIAGNOSIS — R52 Pain, unspecified: Secondary | ICD-10-CM | POA: Diagnosis not present

## 2019-05-17 DIAGNOSIS — M1000 Idiopathic gout, unspecified site: Secondary | ICD-10-CM | POA: Diagnosis not present

## 2019-05-17 DIAGNOSIS — D518 Other vitamin B12 deficiency anemias: Secondary | ICD-10-CM | POA: Diagnosis not present

## 2019-05-17 DIAGNOSIS — R1312 Dysphagia, oropharyngeal phase: Secondary | ICD-10-CM | POA: Diagnosis not present

## 2019-05-17 DIAGNOSIS — E538 Deficiency of other specified B group vitamins: Secondary | ICD-10-CM | POA: Diagnosis not present

## 2019-05-17 NOTE — Patient Outreach (Signed)
Telephone outreach to patient to obtain mRS was successfully completed. Spoke with caregiver. MRS=4

## 2019-05-18 DIAGNOSIS — I6350 Cerebral infarction due to unspecified occlusion or stenosis of unspecified cerebral artery: Secondary | ICD-10-CM | POA: Diagnosis not present

## 2019-05-18 DIAGNOSIS — M1000 Idiopathic gout, unspecified site: Secondary | ICD-10-CM | POA: Diagnosis not present

## 2019-05-18 DIAGNOSIS — E785 Hyperlipidemia, unspecified: Secondary | ICD-10-CM | POA: Diagnosis not present

## 2019-05-18 DIAGNOSIS — I69328 Other speech and language deficits following cerebral infarction: Secondary | ICD-10-CM | POA: Diagnosis not present

## 2019-05-18 DIAGNOSIS — I10 Essential (primary) hypertension: Secondary | ICD-10-CM | POA: Diagnosis not present

## 2019-05-18 DIAGNOSIS — E569 Vitamin deficiency, unspecified: Secondary | ICD-10-CM | POA: Diagnosis not present

## 2019-05-18 DIAGNOSIS — D518 Other vitamin B12 deficiency anemias: Secondary | ICD-10-CM | POA: Diagnosis not present

## 2019-05-18 DIAGNOSIS — G459 Transient cerebral ischemic attack, unspecified: Secondary | ICD-10-CM | POA: Diagnosis not present

## 2019-05-18 DIAGNOSIS — E039 Hypothyroidism, unspecified: Secondary | ICD-10-CM | POA: Diagnosis not present

## 2019-05-18 DIAGNOSIS — K219 Gastro-esophageal reflux disease without esophagitis: Secondary | ICD-10-CM | POA: Diagnosis not present

## 2019-05-18 DIAGNOSIS — R1312 Dysphagia, oropharyngeal phase: Secondary | ICD-10-CM | POA: Diagnosis not present

## 2019-05-18 DIAGNOSIS — I69391 Dysphagia following cerebral infarction: Secondary | ICD-10-CM | POA: Diagnosis not present

## 2019-05-18 DIAGNOSIS — R52 Pain, unspecified: Secondary | ICD-10-CM | POA: Diagnosis not present

## 2019-05-19 DIAGNOSIS — R52 Pain, unspecified: Secondary | ICD-10-CM | POA: Diagnosis not present

## 2019-05-19 DIAGNOSIS — D518 Other vitamin B12 deficiency anemias: Secondary | ICD-10-CM | POA: Diagnosis not present

## 2019-05-19 DIAGNOSIS — I69391 Dysphagia following cerebral infarction: Secondary | ICD-10-CM | POA: Diagnosis not present

## 2019-05-19 DIAGNOSIS — E569 Vitamin deficiency, unspecified: Secondary | ICD-10-CM | POA: Diagnosis not present

## 2019-05-19 DIAGNOSIS — G459 Transient cerebral ischemic attack, unspecified: Secondary | ICD-10-CM | POA: Diagnosis not present

## 2019-05-19 DIAGNOSIS — M1000 Idiopathic gout, unspecified site: Secondary | ICD-10-CM | POA: Diagnosis not present

## 2019-05-19 DIAGNOSIS — I6350 Cerebral infarction due to unspecified occlusion or stenosis of unspecified cerebral artery: Secondary | ICD-10-CM | POA: Diagnosis not present

## 2019-05-19 DIAGNOSIS — E039 Hypothyroidism, unspecified: Secondary | ICD-10-CM | POA: Diagnosis not present

## 2019-05-19 DIAGNOSIS — R1312 Dysphagia, oropharyngeal phase: Secondary | ICD-10-CM | POA: Diagnosis not present

## 2019-05-19 DIAGNOSIS — E785 Hyperlipidemia, unspecified: Secondary | ICD-10-CM | POA: Diagnosis not present

## 2019-05-19 DIAGNOSIS — I69328 Other speech and language deficits following cerebral infarction: Secondary | ICD-10-CM | POA: Diagnosis not present

## 2019-05-19 DIAGNOSIS — K219 Gastro-esophageal reflux disease without esophagitis: Secondary | ICD-10-CM | POA: Diagnosis not present

## 2019-05-19 DIAGNOSIS — I10 Essential (primary) hypertension: Secondary | ICD-10-CM | POA: Diagnosis not present

## 2019-05-20 DIAGNOSIS — I69391 Dysphagia following cerebral infarction: Secondary | ICD-10-CM | POA: Diagnosis not present

## 2019-05-20 DIAGNOSIS — I6350 Cerebral infarction due to unspecified occlusion or stenosis of unspecified cerebral artery: Secondary | ICD-10-CM | POA: Diagnosis not present

## 2019-05-20 DIAGNOSIS — E785 Hyperlipidemia, unspecified: Secondary | ICD-10-CM | POA: Diagnosis not present

## 2019-05-20 DIAGNOSIS — G459 Transient cerebral ischemic attack, unspecified: Secondary | ICD-10-CM | POA: Diagnosis not present

## 2019-05-20 DIAGNOSIS — E039 Hypothyroidism, unspecified: Secondary | ICD-10-CM | POA: Diagnosis not present

## 2019-05-20 DIAGNOSIS — I69328 Other speech and language deficits following cerebral infarction: Secondary | ICD-10-CM | POA: Diagnosis not present

## 2019-05-20 DIAGNOSIS — K219 Gastro-esophageal reflux disease without esophagitis: Secondary | ICD-10-CM | POA: Diagnosis not present

## 2019-05-20 DIAGNOSIS — I10 Essential (primary) hypertension: Secondary | ICD-10-CM | POA: Diagnosis not present

## 2019-05-20 DIAGNOSIS — R1312 Dysphagia, oropharyngeal phase: Secondary | ICD-10-CM | POA: Diagnosis not present

## 2019-05-20 DIAGNOSIS — R52 Pain, unspecified: Secondary | ICD-10-CM | POA: Diagnosis not present

## 2019-05-20 DIAGNOSIS — E569 Vitamin deficiency, unspecified: Secondary | ICD-10-CM | POA: Diagnosis not present

## 2019-05-20 DIAGNOSIS — D518 Other vitamin B12 deficiency anemias: Secondary | ICD-10-CM | POA: Diagnosis not present

## 2019-05-20 DIAGNOSIS — M1000 Idiopathic gout, unspecified site: Secondary | ICD-10-CM | POA: Diagnosis not present

## 2019-05-21 DIAGNOSIS — I10 Essential (primary) hypertension: Secondary | ICD-10-CM | POA: Diagnosis not present

## 2019-05-21 DIAGNOSIS — I69328 Other speech and language deficits following cerebral infarction: Secondary | ICD-10-CM | POA: Diagnosis not present

## 2019-05-21 DIAGNOSIS — I6350 Cerebral infarction due to unspecified occlusion or stenosis of unspecified cerebral artery: Secondary | ICD-10-CM | POA: Diagnosis not present

## 2019-05-21 DIAGNOSIS — E569 Vitamin deficiency, unspecified: Secondary | ICD-10-CM | POA: Diagnosis not present

## 2019-05-21 DIAGNOSIS — E785 Hyperlipidemia, unspecified: Secondary | ICD-10-CM | POA: Diagnosis not present

## 2019-05-21 DIAGNOSIS — D518 Other vitamin B12 deficiency anemias: Secondary | ICD-10-CM | POA: Diagnosis not present

## 2019-05-21 DIAGNOSIS — E039 Hypothyroidism, unspecified: Secondary | ICD-10-CM | POA: Diagnosis not present

## 2019-05-21 DIAGNOSIS — R1312 Dysphagia, oropharyngeal phase: Secondary | ICD-10-CM | POA: Diagnosis not present

## 2019-05-21 DIAGNOSIS — R52 Pain, unspecified: Secondary | ICD-10-CM | POA: Diagnosis not present

## 2019-05-21 DIAGNOSIS — M1000 Idiopathic gout, unspecified site: Secondary | ICD-10-CM | POA: Diagnosis not present

## 2019-05-21 DIAGNOSIS — K219 Gastro-esophageal reflux disease without esophagitis: Secondary | ICD-10-CM | POA: Diagnosis not present

## 2019-05-21 DIAGNOSIS — G459 Transient cerebral ischemic attack, unspecified: Secondary | ICD-10-CM | POA: Diagnosis not present

## 2019-05-21 DIAGNOSIS — I69391 Dysphagia following cerebral infarction: Secondary | ICD-10-CM | POA: Diagnosis not present

## 2019-05-23 ENCOUNTER — Telehealth: Payer: Self-pay

## 2019-05-23 DIAGNOSIS — M1000 Idiopathic gout, unspecified site: Secondary | ICD-10-CM | POA: Diagnosis not present

## 2019-05-23 DIAGNOSIS — E785 Hyperlipidemia, unspecified: Secondary | ICD-10-CM | POA: Diagnosis not present

## 2019-05-23 DIAGNOSIS — G459 Transient cerebral ischemic attack, unspecified: Secondary | ICD-10-CM | POA: Diagnosis not present

## 2019-05-23 DIAGNOSIS — I69328 Other speech and language deficits following cerebral infarction: Secondary | ICD-10-CM | POA: Diagnosis not present

## 2019-05-23 DIAGNOSIS — D518 Other vitamin B12 deficiency anemias: Secondary | ICD-10-CM | POA: Diagnosis not present

## 2019-05-23 DIAGNOSIS — R52 Pain, unspecified: Secondary | ICD-10-CM | POA: Diagnosis not present

## 2019-05-23 DIAGNOSIS — R1312 Dysphagia, oropharyngeal phase: Secondary | ICD-10-CM | POA: Diagnosis not present

## 2019-05-23 DIAGNOSIS — I6350 Cerebral infarction due to unspecified occlusion or stenosis of unspecified cerebral artery: Secondary | ICD-10-CM | POA: Diagnosis not present

## 2019-05-23 DIAGNOSIS — E569 Vitamin deficiency, unspecified: Secondary | ICD-10-CM | POA: Diagnosis not present

## 2019-05-23 DIAGNOSIS — E039 Hypothyroidism, unspecified: Secondary | ICD-10-CM | POA: Diagnosis not present

## 2019-05-23 DIAGNOSIS — K219 Gastro-esophageal reflux disease without esophagitis: Secondary | ICD-10-CM | POA: Diagnosis not present

## 2019-05-23 DIAGNOSIS — I69391 Dysphagia following cerebral infarction: Secondary | ICD-10-CM | POA: Diagnosis not present

## 2019-05-23 DIAGNOSIS — I10 Essential (primary) hypertension: Secondary | ICD-10-CM | POA: Diagnosis not present

## 2019-05-23 NOTE — Telephone Encounter (Signed)
HOME HEALTH PLAN OF CARE SIGNED AND PLACED IN [facility] FOLDER.

## 2019-05-23 NOTE — Telephone Encounter (Signed)
HOME HEALTH ORDER SIGNED,FAXED AND PLACED IN [facility] FOLDER.

## 2019-05-24 DIAGNOSIS — R52 Pain, unspecified: Secondary | ICD-10-CM | POA: Diagnosis not present

## 2019-05-24 DIAGNOSIS — I69391 Dysphagia following cerebral infarction: Secondary | ICD-10-CM | POA: Diagnosis not present

## 2019-05-24 DIAGNOSIS — I69959 Hemiplegia and hemiparesis following unspecified cerebrovascular disease affecting unspecified side: Secondary | ICD-10-CM | POA: Diagnosis not present

## 2019-05-24 DIAGNOSIS — I10 Essential (primary) hypertension: Secondary | ICD-10-CM | POA: Diagnosis not present

## 2019-05-24 DIAGNOSIS — K219 Gastro-esophageal reflux disease without esophagitis: Secondary | ICD-10-CM | POA: Diagnosis not present

## 2019-05-24 DIAGNOSIS — I6350 Cerebral infarction due to unspecified occlusion or stenosis of unspecified cerebral artery: Secondary | ICD-10-CM | POA: Diagnosis not present

## 2019-05-24 DIAGNOSIS — Z1159 Encounter for screening for other viral diseases: Secondary | ICD-10-CM | POA: Diagnosis not present

## 2019-05-24 DIAGNOSIS — E569 Vitamin deficiency, unspecified: Secondary | ICD-10-CM | POA: Diagnosis not present

## 2019-05-24 DIAGNOSIS — E039 Hypothyroidism, unspecified: Secondary | ICD-10-CM | POA: Diagnosis not present

## 2019-05-24 DIAGNOSIS — G4700 Insomnia, unspecified: Secondary | ICD-10-CM | POA: Diagnosis not present

## 2019-05-24 DIAGNOSIS — E538 Deficiency of other specified B group vitamins: Secondary | ICD-10-CM | POA: Diagnosis not present

## 2019-05-24 DIAGNOSIS — R1312 Dysphagia, oropharyngeal phase: Secondary | ICD-10-CM | POA: Diagnosis not present

## 2019-05-24 DIAGNOSIS — M1000 Idiopathic gout, unspecified site: Secondary | ICD-10-CM | POA: Diagnosis not present

## 2019-05-24 DIAGNOSIS — E785 Hyperlipidemia, unspecified: Secondary | ICD-10-CM | POA: Diagnosis not present

## 2019-05-24 DIAGNOSIS — D518 Other vitamin B12 deficiency anemias: Secondary | ICD-10-CM | POA: Diagnosis not present

## 2019-05-24 DIAGNOSIS — G459 Transient cerebral ischemic attack, unspecified: Secondary | ICD-10-CM | POA: Diagnosis not present

## 2019-05-24 DIAGNOSIS — I69328 Other speech and language deficits following cerebral infarction: Secondary | ICD-10-CM | POA: Diagnosis not present

## 2019-05-25 DIAGNOSIS — E039 Hypothyroidism, unspecified: Secondary | ICD-10-CM | POA: Diagnosis not present

## 2019-05-25 DIAGNOSIS — R52 Pain, unspecified: Secondary | ICD-10-CM | POA: Diagnosis not present

## 2019-05-25 DIAGNOSIS — R319 Hematuria, unspecified: Secondary | ICD-10-CM | POA: Diagnosis not present

## 2019-05-25 DIAGNOSIS — K219 Gastro-esophageal reflux disease without esophagitis: Secondary | ICD-10-CM | POA: Diagnosis not present

## 2019-05-25 DIAGNOSIS — E569 Vitamin deficiency, unspecified: Secondary | ICD-10-CM | POA: Diagnosis not present

## 2019-05-25 DIAGNOSIS — I6350 Cerebral infarction due to unspecified occlusion or stenosis of unspecified cerebral artery: Secondary | ICD-10-CM | POA: Diagnosis not present

## 2019-05-25 DIAGNOSIS — R569 Unspecified convulsions: Secondary | ICD-10-CM | POA: Diagnosis not present

## 2019-05-25 DIAGNOSIS — G459 Transient cerebral ischemic attack, unspecified: Secondary | ICD-10-CM | POA: Diagnosis not present

## 2019-05-25 DIAGNOSIS — I69328 Other speech and language deficits following cerebral infarction: Secondary | ICD-10-CM | POA: Diagnosis not present

## 2019-05-25 DIAGNOSIS — E785 Hyperlipidemia, unspecified: Secondary | ICD-10-CM | POA: Diagnosis not present

## 2019-05-25 DIAGNOSIS — I10 Essential (primary) hypertension: Secondary | ICD-10-CM | POA: Diagnosis not present

## 2019-05-25 DIAGNOSIS — N390 Urinary tract infection, site not specified: Secondary | ICD-10-CM | POA: Diagnosis not present

## 2019-05-25 DIAGNOSIS — D649 Anemia, unspecified: Secondary | ICD-10-CM | POA: Diagnosis not present

## 2019-05-25 DIAGNOSIS — M1000 Idiopathic gout, unspecified site: Secondary | ICD-10-CM | POA: Diagnosis not present

## 2019-05-25 DIAGNOSIS — R1312 Dysphagia, oropharyngeal phase: Secondary | ICD-10-CM | POA: Diagnosis not present

## 2019-05-25 DIAGNOSIS — I69391 Dysphagia following cerebral infarction: Secondary | ICD-10-CM | POA: Diagnosis not present

## 2019-05-25 DIAGNOSIS — D518 Other vitamin B12 deficiency anemias: Secondary | ICD-10-CM | POA: Diagnosis not present

## 2019-05-26 DIAGNOSIS — K219 Gastro-esophageal reflux disease without esophagitis: Secondary | ICD-10-CM | POA: Diagnosis not present

## 2019-05-26 DIAGNOSIS — R52 Pain, unspecified: Secondary | ICD-10-CM | POA: Diagnosis not present

## 2019-05-26 DIAGNOSIS — E039 Hypothyroidism, unspecified: Secondary | ICD-10-CM | POA: Diagnosis not present

## 2019-05-26 DIAGNOSIS — G459 Transient cerebral ischemic attack, unspecified: Secondary | ICD-10-CM | POA: Diagnosis not present

## 2019-05-26 DIAGNOSIS — I6350 Cerebral infarction due to unspecified occlusion or stenosis of unspecified cerebral artery: Secondary | ICD-10-CM | POA: Diagnosis not present

## 2019-05-26 DIAGNOSIS — R1312 Dysphagia, oropharyngeal phase: Secondary | ICD-10-CM | POA: Diagnosis not present

## 2019-05-26 DIAGNOSIS — E785 Hyperlipidemia, unspecified: Secondary | ICD-10-CM | POA: Diagnosis not present

## 2019-05-26 DIAGNOSIS — I10 Essential (primary) hypertension: Secondary | ICD-10-CM | POA: Diagnosis not present

## 2019-05-26 DIAGNOSIS — M1000 Idiopathic gout, unspecified site: Secondary | ICD-10-CM | POA: Diagnosis not present

## 2019-05-26 DIAGNOSIS — D518 Other vitamin B12 deficiency anemias: Secondary | ICD-10-CM | POA: Diagnosis not present

## 2019-05-26 DIAGNOSIS — I69391 Dysphagia following cerebral infarction: Secondary | ICD-10-CM | POA: Diagnosis not present

## 2019-05-26 DIAGNOSIS — E569 Vitamin deficiency, unspecified: Secondary | ICD-10-CM | POA: Diagnosis not present

## 2019-05-26 DIAGNOSIS — I69328 Other speech and language deficits following cerebral infarction: Secondary | ICD-10-CM | POA: Diagnosis not present

## 2019-05-27 DIAGNOSIS — M1000 Idiopathic gout, unspecified site: Secondary | ICD-10-CM | POA: Diagnosis not present

## 2019-05-27 DIAGNOSIS — D518 Other vitamin B12 deficiency anemias: Secondary | ICD-10-CM | POA: Diagnosis not present

## 2019-05-27 DIAGNOSIS — I6350 Cerebral infarction due to unspecified occlusion or stenosis of unspecified cerebral artery: Secondary | ICD-10-CM | POA: Diagnosis not present

## 2019-05-27 DIAGNOSIS — K219 Gastro-esophageal reflux disease without esophagitis: Secondary | ICD-10-CM | POA: Diagnosis not present

## 2019-05-27 DIAGNOSIS — R52 Pain, unspecified: Secondary | ICD-10-CM | POA: Diagnosis not present

## 2019-05-27 DIAGNOSIS — R1312 Dysphagia, oropharyngeal phase: Secondary | ICD-10-CM | POA: Diagnosis not present

## 2019-05-27 DIAGNOSIS — E039 Hypothyroidism, unspecified: Secondary | ICD-10-CM | POA: Diagnosis not present

## 2019-05-27 DIAGNOSIS — G459 Transient cerebral ischemic attack, unspecified: Secondary | ICD-10-CM | POA: Diagnosis not present

## 2019-05-27 DIAGNOSIS — I69328 Other speech and language deficits following cerebral infarction: Secondary | ICD-10-CM | POA: Diagnosis not present

## 2019-05-27 DIAGNOSIS — I69391 Dysphagia following cerebral infarction: Secondary | ICD-10-CM | POA: Diagnosis not present

## 2019-05-27 DIAGNOSIS — I10 Essential (primary) hypertension: Secondary | ICD-10-CM | POA: Diagnosis not present

## 2019-05-27 DIAGNOSIS — E569 Vitamin deficiency, unspecified: Secondary | ICD-10-CM | POA: Diagnosis not present

## 2019-05-27 DIAGNOSIS — E785 Hyperlipidemia, unspecified: Secondary | ICD-10-CM | POA: Diagnosis not present

## 2019-05-30 DIAGNOSIS — Z79899 Other long term (current) drug therapy: Secondary | ICD-10-CM | POA: Diagnosis not present

## 2019-05-30 DIAGNOSIS — R319 Hematuria, unspecified: Secondary | ICD-10-CM | POA: Diagnosis not present

## 2019-05-30 DIAGNOSIS — D518 Other vitamin B12 deficiency anemias: Secondary | ICD-10-CM | POA: Diagnosis not present

## 2019-05-30 DIAGNOSIS — E569 Vitamin deficiency, unspecified: Secondary | ICD-10-CM | POA: Diagnosis not present

## 2019-05-30 DIAGNOSIS — G459 Transient cerebral ischemic attack, unspecified: Secondary | ICD-10-CM | POA: Diagnosis not present

## 2019-05-30 DIAGNOSIS — R1312 Dysphagia, oropharyngeal phase: Secondary | ICD-10-CM | POA: Diagnosis not present

## 2019-05-30 DIAGNOSIS — N390 Urinary tract infection, site not specified: Secondary | ICD-10-CM | POA: Diagnosis not present

## 2019-05-30 DIAGNOSIS — I69391 Dysphagia following cerebral infarction: Secondary | ICD-10-CM | POA: Diagnosis not present

## 2019-05-30 DIAGNOSIS — E785 Hyperlipidemia, unspecified: Secondary | ICD-10-CM | POA: Diagnosis not present

## 2019-05-30 DIAGNOSIS — K219 Gastro-esophageal reflux disease without esophagitis: Secondary | ICD-10-CM | POA: Diagnosis not present

## 2019-05-30 DIAGNOSIS — R52 Pain, unspecified: Secondary | ICD-10-CM | POA: Diagnosis not present

## 2019-05-30 DIAGNOSIS — E039 Hypothyroidism, unspecified: Secondary | ICD-10-CM | POA: Diagnosis not present

## 2019-05-30 DIAGNOSIS — I6350 Cerebral infarction due to unspecified occlusion or stenosis of unspecified cerebral artery: Secondary | ICD-10-CM | POA: Diagnosis not present

## 2019-05-30 DIAGNOSIS — I69328 Other speech and language deficits following cerebral infarction: Secondary | ICD-10-CM | POA: Diagnosis not present

## 2019-05-30 DIAGNOSIS — M1000 Idiopathic gout, unspecified site: Secondary | ICD-10-CM | POA: Diagnosis not present

## 2019-05-30 DIAGNOSIS — I10 Essential (primary) hypertension: Secondary | ICD-10-CM | POA: Diagnosis not present

## 2019-05-31 DIAGNOSIS — D518 Other vitamin B12 deficiency anemias: Secondary | ICD-10-CM | POA: Diagnosis not present

## 2019-05-31 DIAGNOSIS — G459 Transient cerebral ischemic attack, unspecified: Secondary | ICD-10-CM | POA: Diagnosis not present

## 2019-05-31 DIAGNOSIS — E039 Hypothyroidism, unspecified: Secondary | ICD-10-CM | POA: Diagnosis not present

## 2019-05-31 DIAGNOSIS — I10 Essential (primary) hypertension: Secondary | ICD-10-CM | POA: Diagnosis not present

## 2019-05-31 DIAGNOSIS — E785 Hyperlipidemia, unspecified: Secondary | ICD-10-CM | POA: Diagnosis not present

## 2019-05-31 DIAGNOSIS — R52 Pain, unspecified: Secondary | ICD-10-CM | POA: Diagnosis not present

## 2019-05-31 DIAGNOSIS — R1312 Dysphagia, oropharyngeal phase: Secondary | ICD-10-CM | POA: Diagnosis not present

## 2019-05-31 DIAGNOSIS — I6350 Cerebral infarction due to unspecified occlusion or stenosis of unspecified cerebral artery: Secondary | ICD-10-CM | POA: Diagnosis not present

## 2019-05-31 DIAGNOSIS — I69391 Dysphagia following cerebral infarction: Secondary | ICD-10-CM | POA: Diagnosis not present

## 2019-05-31 DIAGNOSIS — Z1159 Encounter for screening for other viral diseases: Secondary | ICD-10-CM | POA: Diagnosis not present

## 2019-05-31 DIAGNOSIS — E538 Deficiency of other specified B group vitamins: Secondary | ICD-10-CM | POA: Diagnosis not present

## 2019-05-31 DIAGNOSIS — M1000 Idiopathic gout, unspecified site: Secondary | ICD-10-CM | POA: Diagnosis not present

## 2019-05-31 DIAGNOSIS — I69959 Hemiplegia and hemiparesis following unspecified cerebrovascular disease affecting unspecified side: Secondary | ICD-10-CM | POA: Diagnosis not present

## 2019-05-31 DIAGNOSIS — K219 Gastro-esophageal reflux disease without esophagitis: Secondary | ICD-10-CM | POA: Diagnosis not present

## 2019-05-31 DIAGNOSIS — E569 Vitamin deficiency, unspecified: Secondary | ICD-10-CM | POA: Diagnosis not present

## 2019-05-31 DIAGNOSIS — G4700 Insomnia, unspecified: Secondary | ICD-10-CM | POA: Diagnosis not present

## 2019-05-31 DIAGNOSIS — I69328 Other speech and language deficits following cerebral infarction: Secondary | ICD-10-CM | POA: Diagnosis not present

## 2019-06-01 DIAGNOSIS — R52 Pain, unspecified: Secondary | ICD-10-CM | POA: Diagnosis not present

## 2019-06-01 DIAGNOSIS — G459 Transient cerebral ischemic attack, unspecified: Secondary | ICD-10-CM | POA: Diagnosis not present

## 2019-06-01 DIAGNOSIS — E785 Hyperlipidemia, unspecified: Secondary | ICD-10-CM | POA: Diagnosis not present

## 2019-06-01 DIAGNOSIS — M1000 Idiopathic gout, unspecified site: Secondary | ICD-10-CM | POA: Diagnosis not present

## 2019-06-01 DIAGNOSIS — R1312 Dysphagia, oropharyngeal phase: Secondary | ICD-10-CM | POA: Diagnosis not present

## 2019-06-01 DIAGNOSIS — I69328 Other speech and language deficits following cerebral infarction: Secondary | ICD-10-CM | POA: Diagnosis not present

## 2019-06-01 DIAGNOSIS — K219 Gastro-esophageal reflux disease without esophagitis: Secondary | ICD-10-CM | POA: Diagnosis not present

## 2019-06-01 DIAGNOSIS — I6350 Cerebral infarction due to unspecified occlusion or stenosis of unspecified cerebral artery: Secondary | ICD-10-CM | POA: Diagnosis not present

## 2019-06-01 DIAGNOSIS — I69391 Dysphagia following cerebral infarction: Secondary | ICD-10-CM | POA: Diagnosis not present

## 2019-06-01 DIAGNOSIS — D518 Other vitamin B12 deficiency anemias: Secondary | ICD-10-CM | POA: Diagnosis not present

## 2019-06-01 DIAGNOSIS — I10 Essential (primary) hypertension: Secondary | ICD-10-CM | POA: Diagnosis not present

## 2019-06-01 DIAGNOSIS — I69354 Hemiplegia and hemiparesis following cerebral infarction affecting left non-dominant side: Secondary | ICD-10-CM | POA: Diagnosis not present

## 2019-06-01 DIAGNOSIS — E039 Hypothyroidism, unspecified: Secondary | ICD-10-CM | POA: Diagnosis not present

## 2019-06-01 DIAGNOSIS — E569 Vitamin deficiency, unspecified: Secondary | ICD-10-CM | POA: Diagnosis not present

## 2019-06-02 DIAGNOSIS — I10 Essential (primary) hypertension: Secondary | ICD-10-CM | POA: Diagnosis not present

## 2019-06-02 DIAGNOSIS — E785 Hyperlipidemia, unspecified: Secondary | ICD-10-CM | POA: Diagnosis not present

## 2019-06-02 DIAGNOSIS — K219 Gastro-esophageal reflux disease without esophagitis: Secondary | ICD-10-CM | POA: Diagnosis not present

## 2019-06-02 DIAGNOSIS — E569 Vitamin deficiency, unspecified: Secondary | ICD-10-CM | POA: Diagnosis not present

## 2019-06-02 DIAGNOSIS — I69328 Other speech and language deficits following cerebral infarction: Secondary | ICD-10-CM | POA: Diagnosis not present

## 2019-06-02 DIAGNOSIS — D518 Other vitamin B12 deficiency anemias: Secondary | ICD-10-CM | POA: Diagnosis not present

## 2019-06-02 DIAGNOSIS — E039 Hypothyroidism, unspecified: Secondary | ICD-10-CM | POA: Diagnosis not present

## 2019-06-02 DIAGNOSIS — G459 Transient cerebral ischemic attack, unspecified: Secondary | ICD-10-CM | POA: Diagnosis not present

## 2019-06-02 DIAGNOSIS — I69391 Dysphagia following cerebral infarction: Secondary | ICD-10-CM | POA: Diagnosis not present

## 2019-06-02 DIAGNOSIS — R1312 Dysphagia, oropharyngeal phase: Secondary | ICD-10-CM | POA: Diagnosis not present

## 2019-06-02 DIAGNOSIS — R52 Pain, unspecified: Secondary | ICD-10-CM | POA: Diagnosis not present

## 2019-06-02 DIAGNOSIS — I6350 Cerebral infarction due to unspecified occlusion or stenosis of unspecified cerebral artery: Secondary | ICD-10-CM | POA: Diagnosis not present

## 2019-06-02 DIAGNOSIS — M1000 Idiopathic gout, unspecified site: Secondary | ICD-10-CM | POA: Diagnosis not present

## 2019-06-07 DIAGNOSIS — Z1159 Encounter for screening for other viral diseases: Secondary | ICD-10-CM | POA: Diagnosis not present

## 2019-06-08 DIAGNOSIS — E039 Hypothyroidism, unspecified: Secondary | ICD-10-CM | POA: Diagnosis not present

## 2019-06-08 DIAGNOSIS — G459 Transient cerebral ischemic attack, unspecified: Secondary | ICD-10-CM | POA: Diagnosis not present

## 2019-06-08 DIAGNOSIS — D518 Other vitamin B12 deficiency anemias: Secondary | ICD-10-CM | POA: Diagnosis not present

## 2019-06-08 DIAGNOSIS — D649 Anemia, unspecified: Secondary | ICD-10-CM | POA: Diagnosis not present

## 2019-06-08 DIAGNOSIS — Z79899 Other long term (current) drug therapy: Secondary | ICD-10-CM | POA: Diagnosis not present

## 2019-06-08 DIAGNOSIS — E785 Hyperlipidemia, unspecified: Secondary | ICD-10-CM | POA: Diagnosis not present

## 2019-06-08 DIAGNOSIS — R319 Hematuria, unspecified: Secondary | ICD-10-CM | POA: Diagnosis not present

## 2019-06-08 DIAGNOSIS — I69959 Hemiplegia and hemiparesis following unspecified cerebrovascular disease affecting unspecified side: Secondary | ICD-10-CM | POA: Diagnosis not present

## 2019-06-08 DIAGNOSIS — E569 Vitamin deficiency, unspecified: Secondary | ICD-10-CM | POA: Diagnosis not present

## 2019-06-08 DIAGNOSIS — G4700 Insomnia, unspecified: Secondary | ICD-10-CM | POA: Diagnosis not present

## 2019-06-08 DIAGNOSIS — K219 Gastro-esophageal reflux disease without esophagitis: Secondary | ICD-10-CM | POA: Diagnosis not present

## 2019-06-08 DIAGNOSIS — N390 Urinary tract infection, site not specified: Secondary | ICD-10-CM | POA: Diagnosis not present

## 2019-06-08 DIAGNOSIS — R52 Pain, unspecified: Secondary | ICD-10-CM | POA: Diagnosis not present

## 2019-06-08 DIAGNOSIS — I10 Essential (primary) hypertension: Secondary | ICD-10-CM | POA: Diagnosis not present

## 2019-06-08 DIAGNOSIS — I69328 Other speech and language deficits following cerebral infarction: Secondary | ICD-10-CM | POA: Diagnosis not present

## 2019-06-08 DIAGNOSIS — M1000 Idiopathic gout, unspecified site: Secondary | ICD-10-CM | POA: Diagnosis not present

## 2019-06-08 DIAGNOSIS — R6889 Other general symptoms and signs: Secondary | ICD-10-CM | POA: Diagnosis not present

## 2019-06-08 DIAGNOSIS — I6350 Cerebral infarction due to unspecified occlusion or stenosis of unspecified cerebral artery: Secondary | ICD-10-CM | POA: Diagnosis not present

## 2019-06-08 DIAGNOSIS — R1312 Dysphagia, oropharyngeal phase: Secondary | ICD-10-CM | POA: Diagnosis not present

## 2019-06-08 DIAGNOSIS — E538 Deficiency of other specified B group vitamins: Secondary | ICD-10-CM | POA: Diagnosis not present

## 2019-06-08 DIAGNOSIS — I69391 Dysphagia following cerebral infarction: Secondary | ICD-10-CM | POA: Diagnosis not present

## 2019-06-09 DIAGNOSIS — G9340 Encephalopathy, unspecified: Secondary | ICD-10-CM | POA: Diagnosis not present

## 2019-06-09 DIAGNOSIS — I6389 Other cerebral infarction: Secondary | ICD-10-CM | POA: Diagnosis not present

## 2019-06-09 DIAGNOSIS — I10 Essential (primary) hypertension: Secondary | ICD-10-CM | POA: Diagnosis not present

## 2019-06-09 DIAGNOSIS — D649 Anemia, unspecified: Secondary | ICD-10-CM | POA: Diagnosis not present

## 2019-06-10 DIAGNOSIS — R6889 Other general symptoms and signs: Secondary | ICD-10-CM | POA: Diagnosis not present

## 2019-06-10 DIAGNOSIS — D649 Anemia, unspecified: Secondary | ICD-10-CM | POA: Diagnosis not present

## 2019-06-10 DIAGNOSIS — I10 Essential (primary) hypertension: Secondary | ICD-10-CM | POA: Diagnosis not present

## 2019-06-13 DIAGNOSIS — R6889 Other general symptoms and signs: Secondary | ICD-10-CM | POA: Diagnosis not present

## 2019-06-13 DIAGNOSIS — D649 Anemia, unspecified: Secondary | ICD-10-CM | POA: Diagnosis not present

## 2019-06-14 DIAGNOSIS — Z1159 Encounter for screening for other viral diseases: Secondary | ICD-10-CM | POA: Diagnosis not present

## 2019-06-16 DIAGNOSIS — I10 Essential (primary) hypertension: Secondary | ICD-10-CM | POA: Diagnosis not present

## 2019-06-16 DIAGNOSIS — D649 Anemia, unspecified: Secondary | ICD-10-CM | POA: Diagnosis not present

## 2019-06-16 DIAGNOSIS — N390 Urinary tract infection, site not specified: Secondary | ICD-10-CM | POA: Diagnosis not present

## 2019-06-16 DIAGNOSIS — R319 Hematuria, unspecified: Secondary | ICD-10-CM | POA: Diagnosis not present

## 2019-06-16 DIAGNOSIS — R6889 Other general symptoms and signs: Secondary | ICD-10-CM | POA: Diagnosis not present

## 2019-06-19 DIAGNOSIS — I10 Essential (primary) hypertension: Secondary | ICD-10-CM | POA: Diagnosis not present

## 2019-06-19 DIAGNOSIS — G4700 Insomnia, unspecified: Secondary | ICD-10-CM | POA: Diagnosis not present

## 2019-06-19 DIAGNOSIS — I6350 Cerebral infarction due to unspecified occlusion or stenosis of unspecified cerebral artery: Secondary | ICD-10-CM | POA: Diagnosis not present

## 2019-06-21 DIAGNOSIS — Z1159 Encounter for screening for other viral diseases: Secondary | ICD-10-CM | POA: Diagnosis not present

## 2019-06-21 DIAGNOSIS — D518 Other vitamin B12 deficiency anemias: Secondary | ICD-10-CM | POA: Diagnosis not present

## 2019-06-21 DIAGNOSIS — G4700 Insomnia, unspecified: Secondary | ICD-10-CM | POA: Diagnosis not present

## 2019-06-21 DIAGNOSIS — I69959 Hemiplegia and hemiparesis following unspecified cerebrovascular disease affecting unspecified side: Secondary | ICD-10-CM | POA: Diagnosis not present

## 2019-07-10 DIAGNOSIS — I6389 Other cerebral infarction: Secondary | ICD-10-CM | POA: Diagnosis not present

## 2019-07-10 DIAGNOSIS — G9340 Encephalopathy, unspecified: Secondary | ICD-10-CM | POA: Diagnosis not present

## 2019-07-11 DEATH — deceased

## 2019-07-25 ENCOUNTER — Telehealth: Payer: Self-pay | Admitting: Adult Health

## 2019-07-25 NOTE — Telephone Encounter (Signed)
Thank you  for update

## 2019-07-25 NOTE — Telephone Encounter (Signed)
[name],[name](daughter) called to report pt passed on 10-Aug-2019

## 2019-07-28 ENCOUNTER — Ambulatory Visit: Payer: Self-pay | Admitting: Adult Health

## 2020-01-17 ENCOUNTER — Ambulatory Visit: Payer: Medicare Other | Admitting: Nurse Practitioner

## 2021-09-03 IMAGING — CR DG CHEST 2V
2 series · 2 of 2 positions shown · non-contrast
Comparison: PA and lateral chest 02/08/2019. Single-view of the
chest 02/28/2018.

CLINICAL DATA: Shortness of breath and chest tightness today.

EXAM:
CHEST - 2 VIEW

[chest lat]
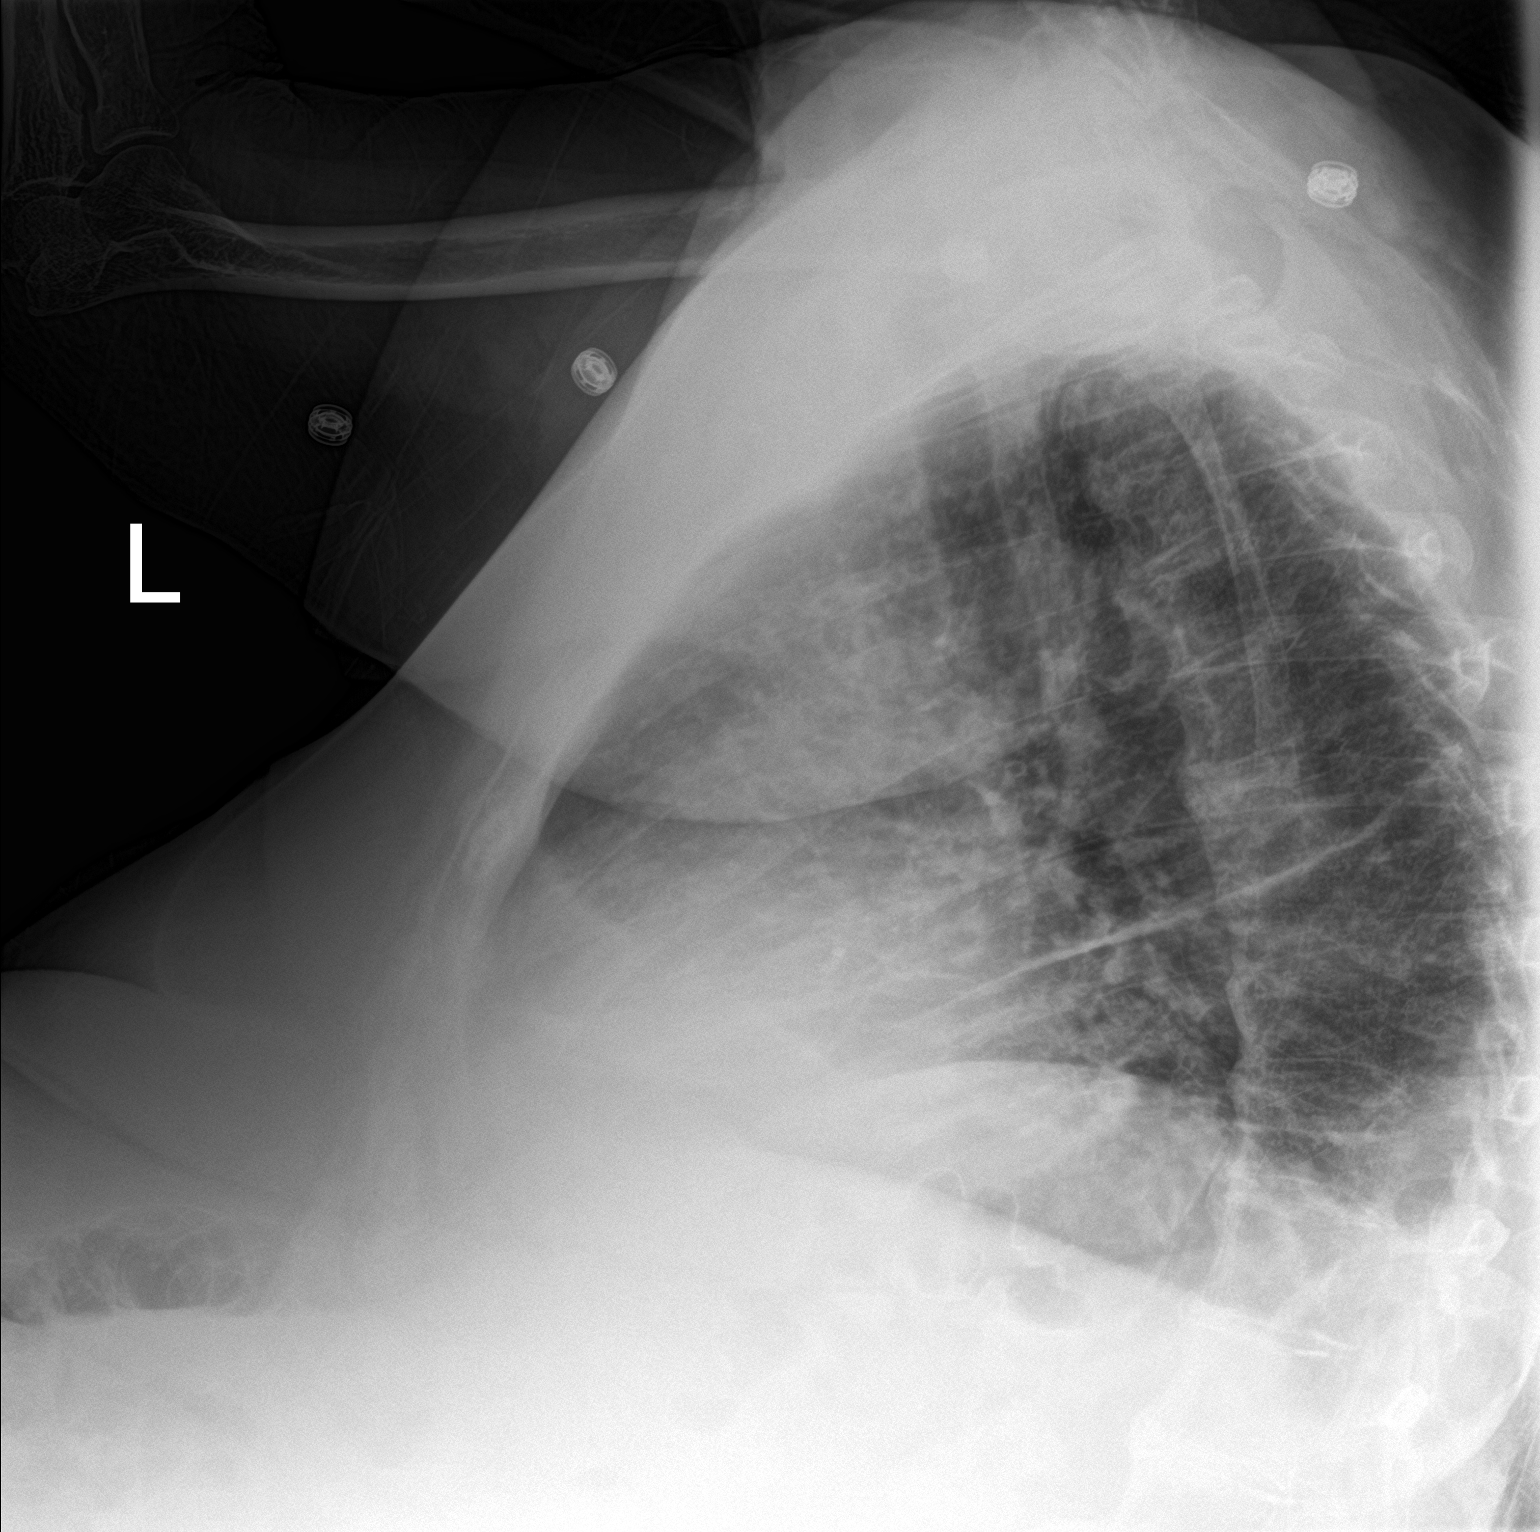

[chest ap]
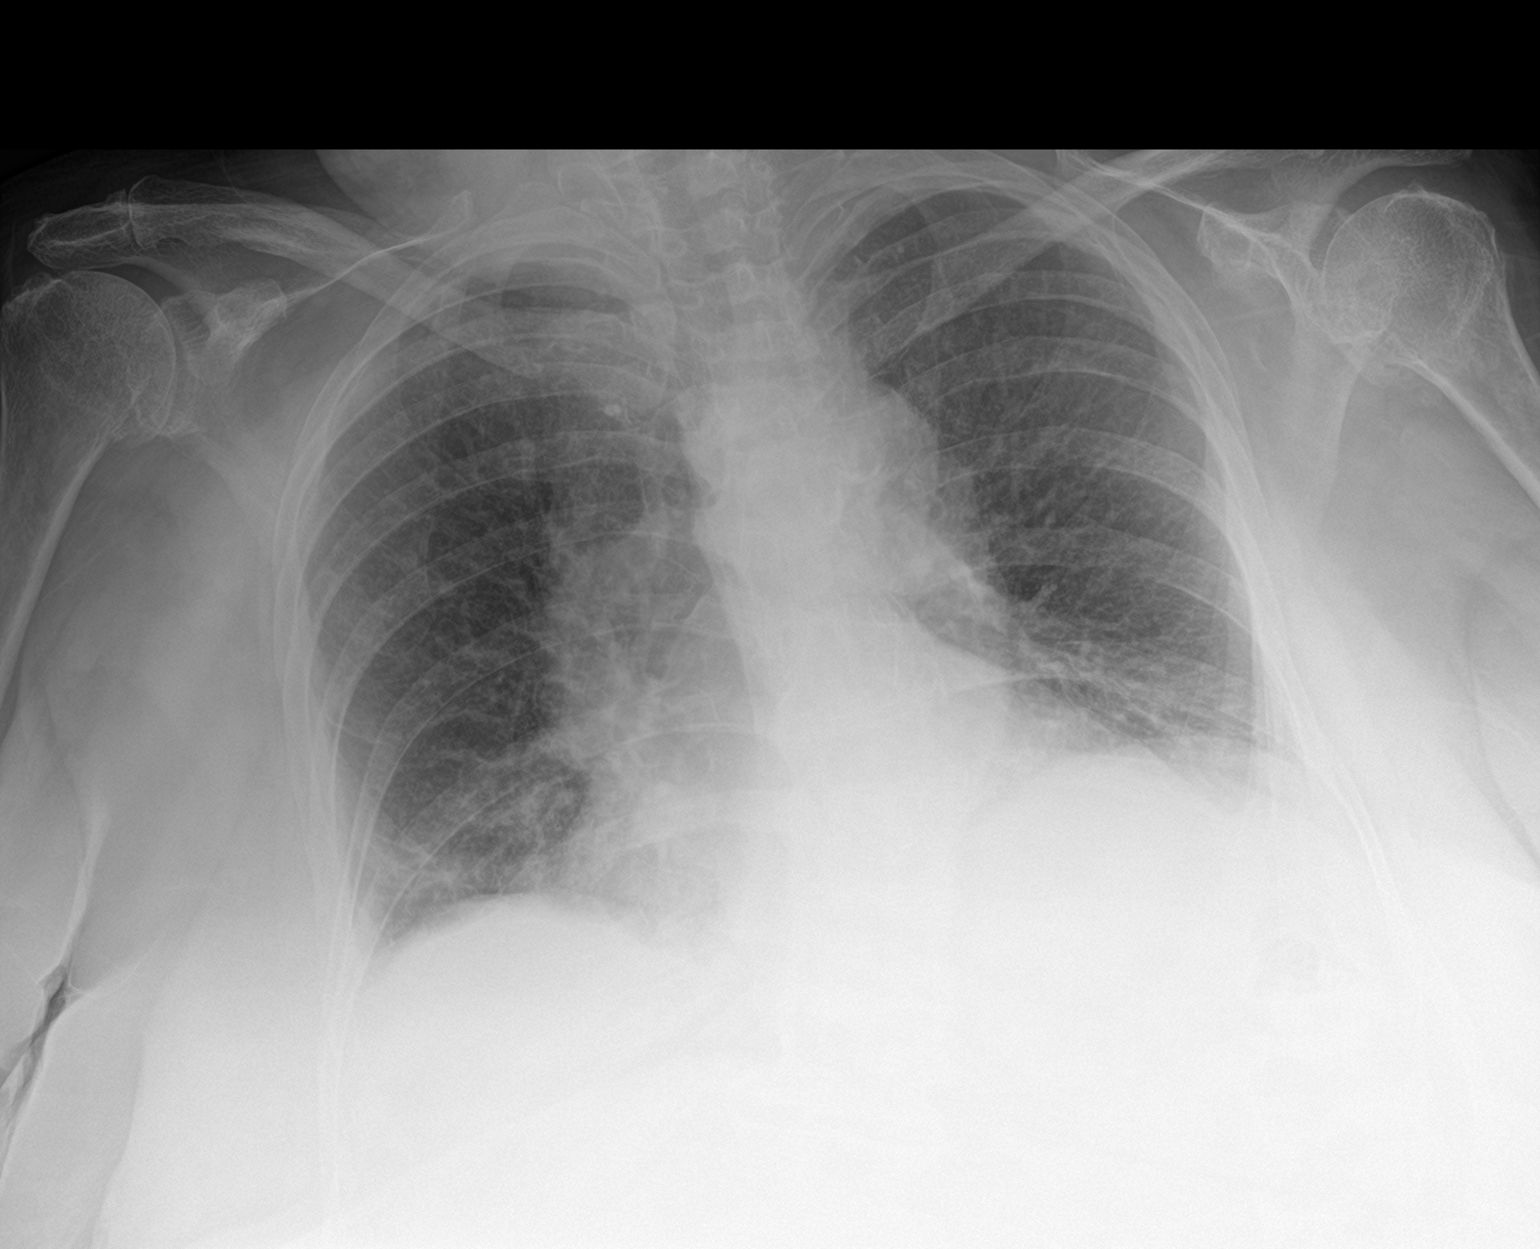

[2 of 2 positions shown; findings below may reference images not displayed]

FINDINGS: There is cardiomegaly and mild interstitial edema. Small bilateral
pleural effusions are seen. No acute or focal bony abnormality.
IMPRESSION: Cardiomegaly and mild interstitial edema with associated small
pleural effusions.

## 2021-09-09 IMAGING — CR DG CHEST 2V
2 series · 2 of 2 positions shown · non-contrast
Comparison: Radiographs February 11, 2019.

CLINICAL DATA: Stroke.

EXAM:
CHEST - 2 VIEW

[chest pa]
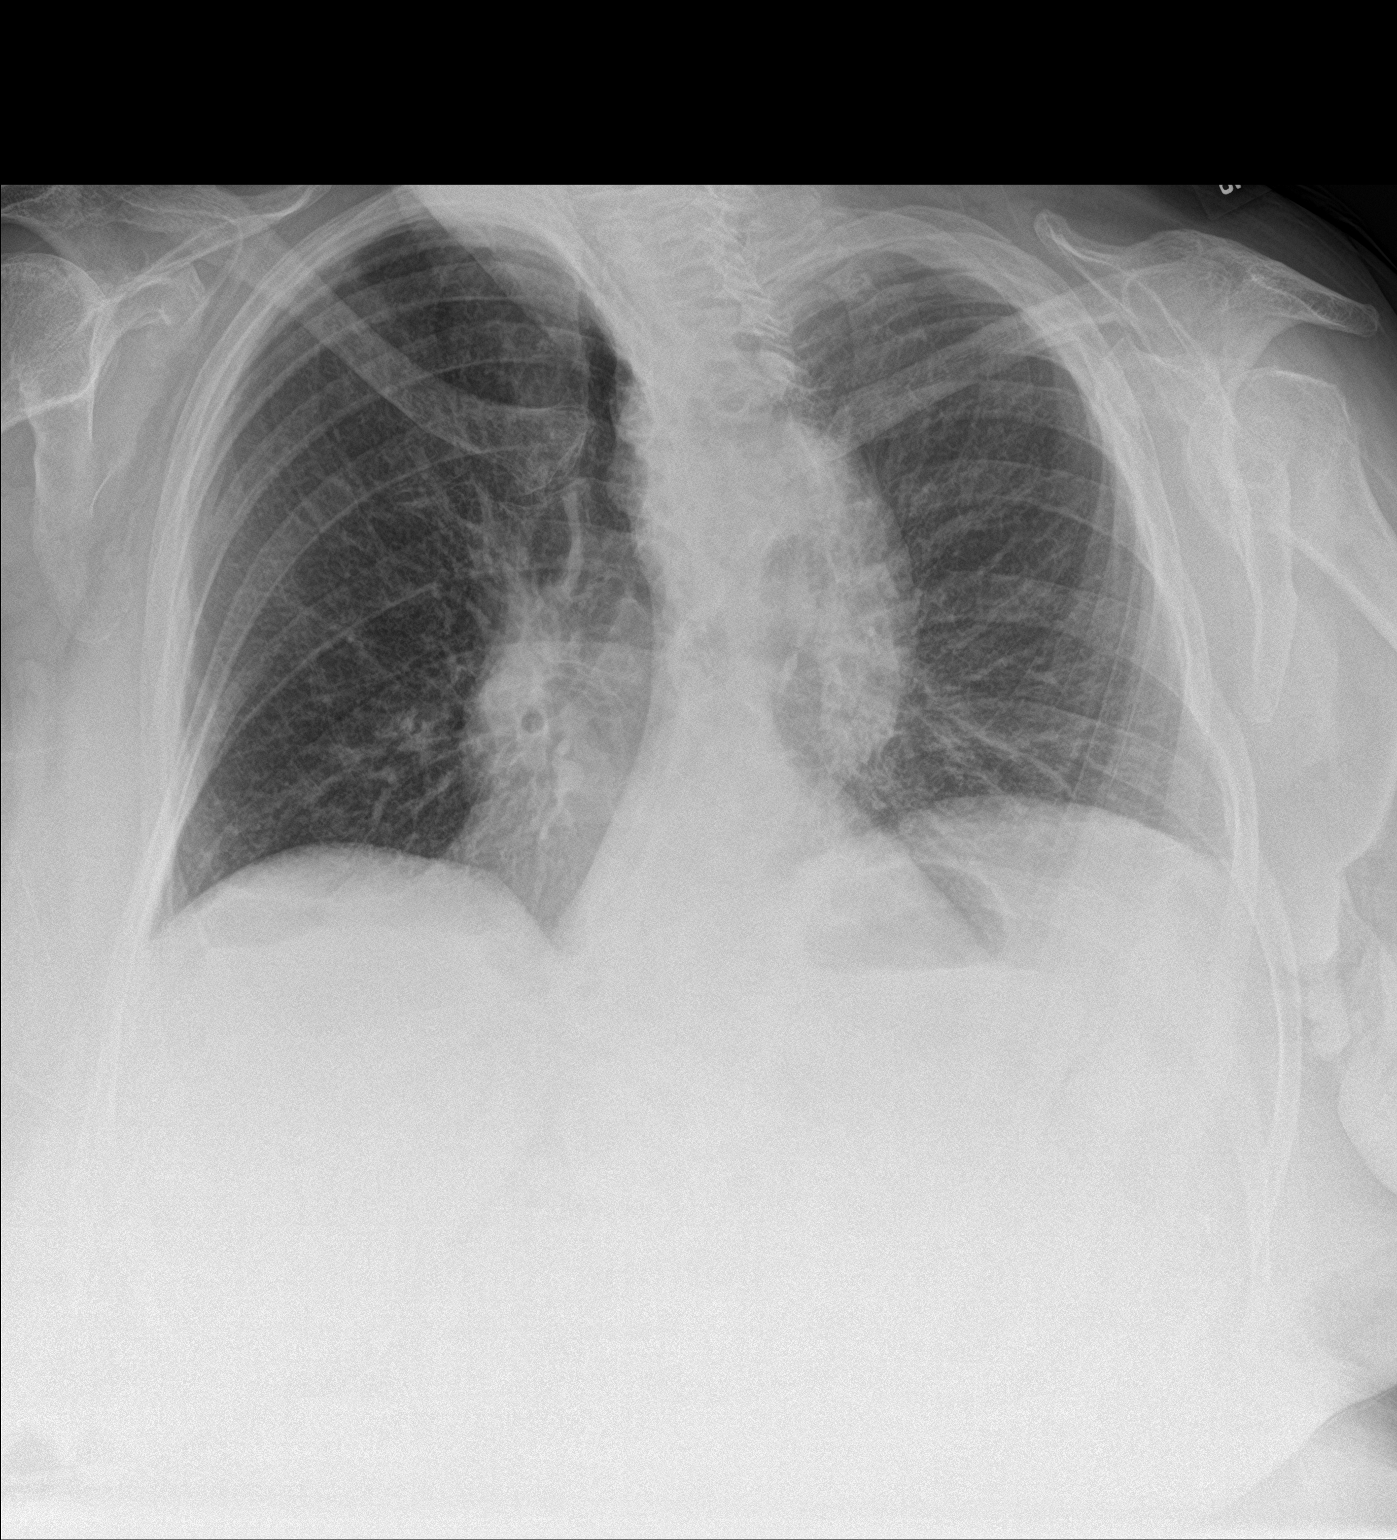

[chest lat]
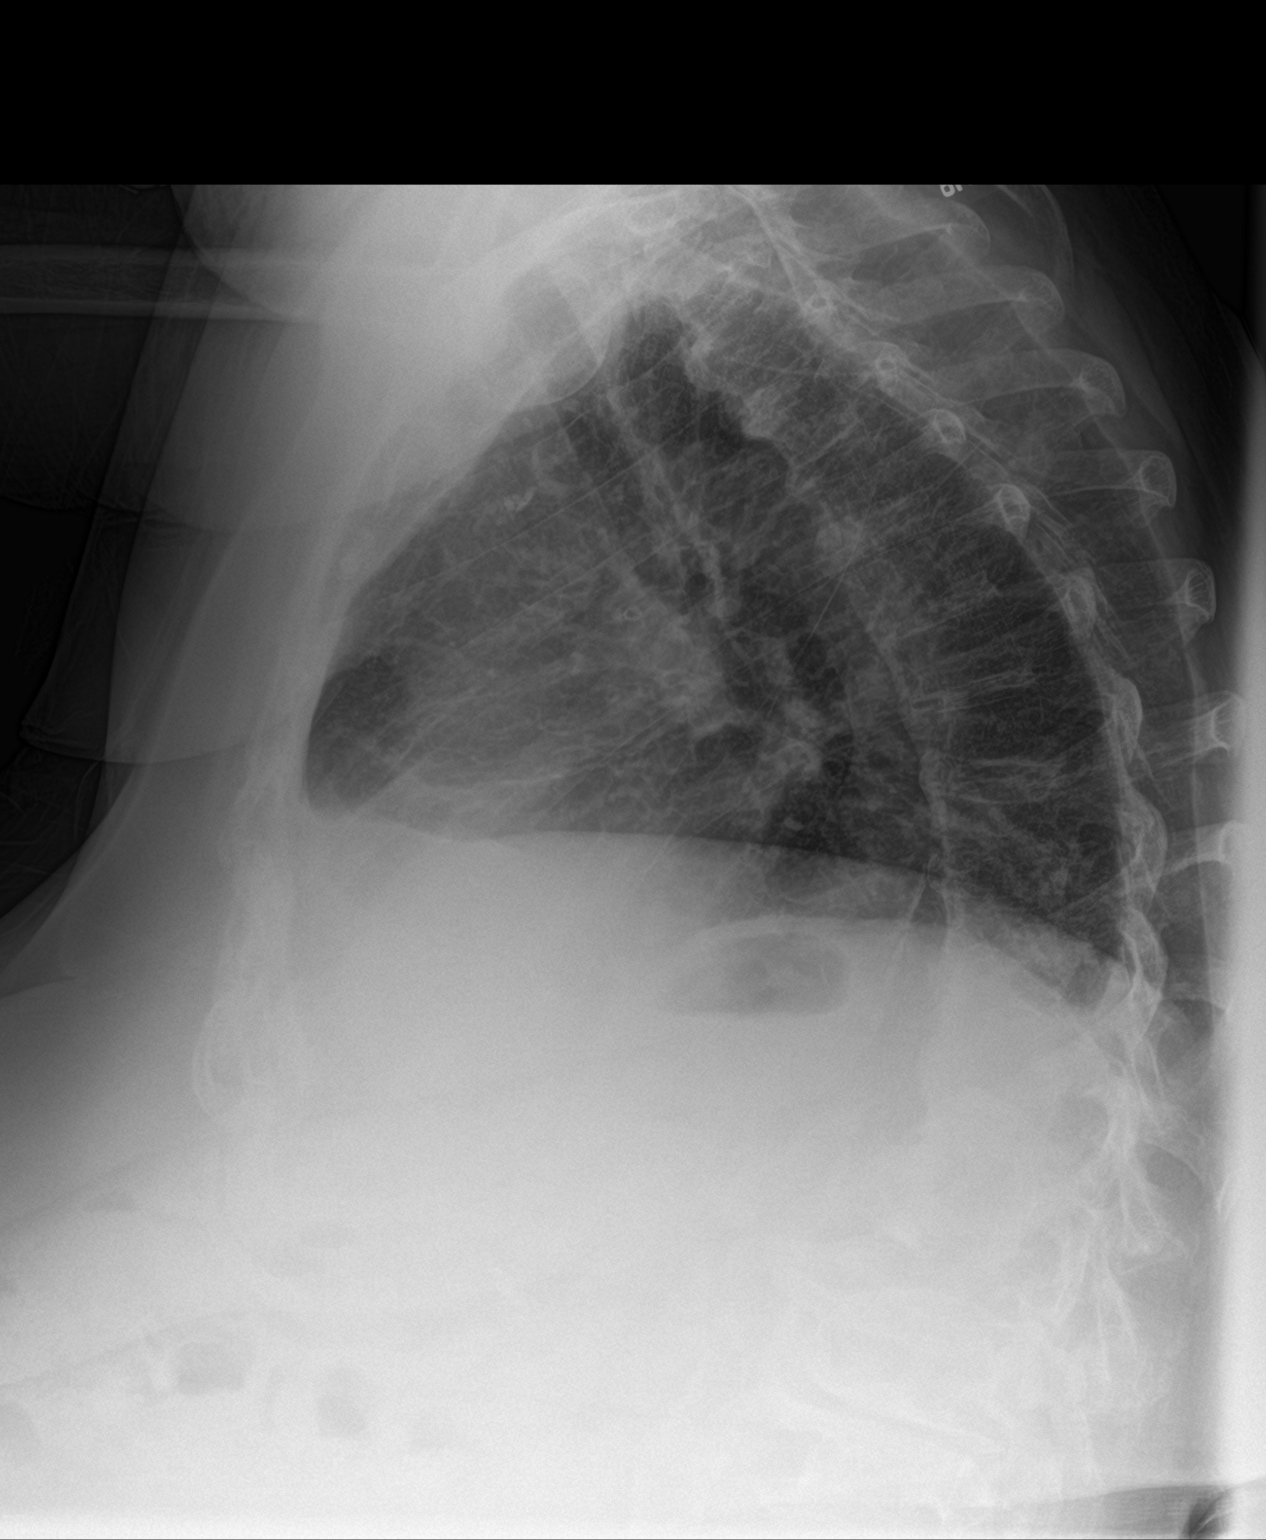

[2 of 2 positions shown; findings below may reference images not displayed]

FINDINGS: The heart size and mediastinal contours are within normal limits.
Both lungs are clear. The visualized skeletal structures are
unremarkable.
IMPRESSION: No active cardiopulmonary disease.

## 2021-11-13 IMAGING — CT CT HEAD W/O CM
3 series · 15 of 47 positions shown, 18 images · non-contrast
Comparison: 04/06/2019

CLINICAL DATA: Un witnessed fall with left eyebrow laceration,
initial encounter

EXAM:
CT HEAD WITHOUT CONTRAST
CT CERVICAL SPINE WITHOUT CONTRAST
TECHNIQUE: Multidetector CT imaging of the head and cervical spine was
performed following the standard protocol without intravenous
contrast. Multiplanar CT image reconstructions of the cervical spine
were also generated.

[Series 3: head wo · axial · 0.47mm/px · z∈[+466,+596]mm · 9 of 32 slices shown, 12 images]
[im 3/32  brain]
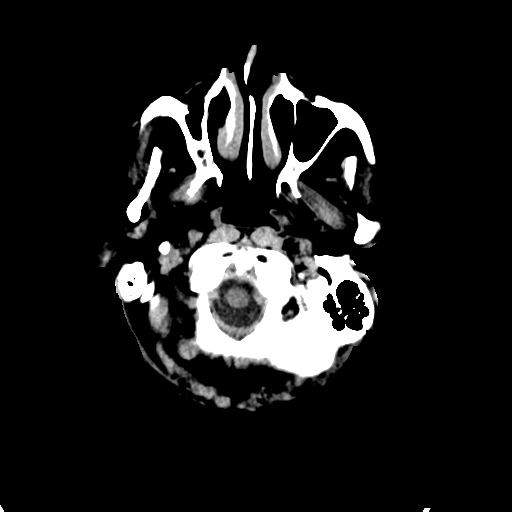
[im 3/32  bone]
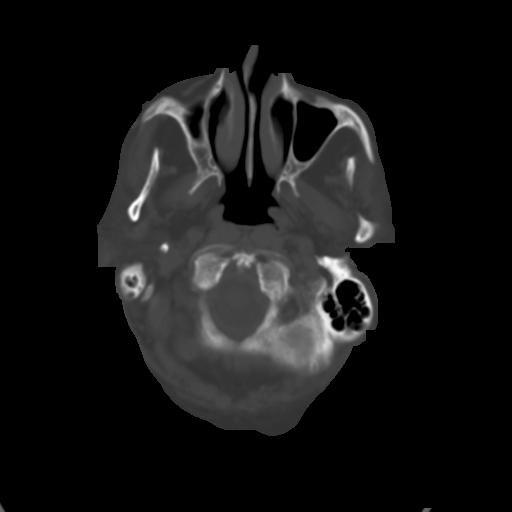
[im 6/32  brain]
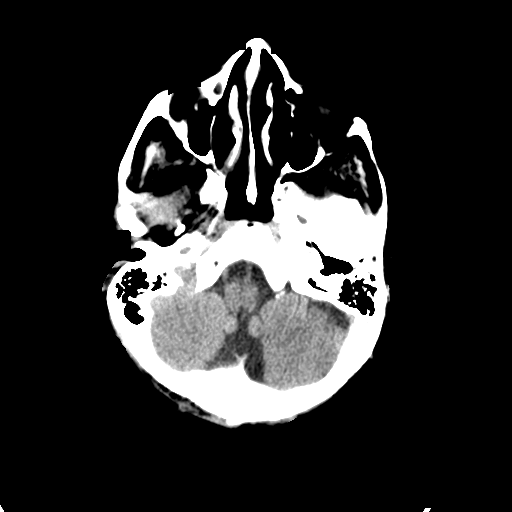
[im 9/32  brain]
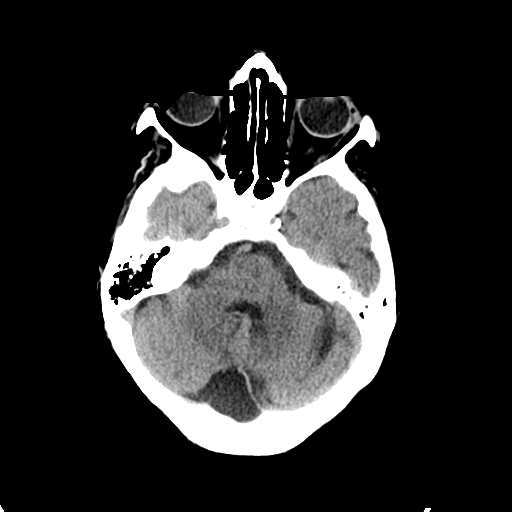
[im 12/32  brain]
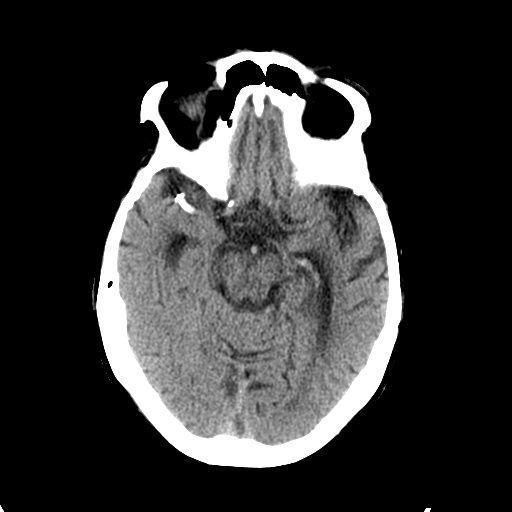
[im 17/32  brain]
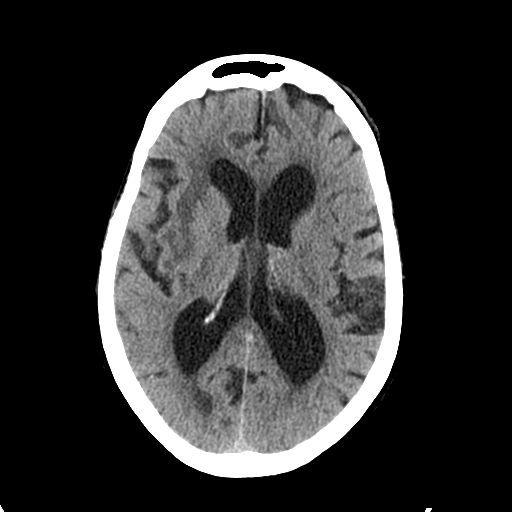
[im 17/32  bone]
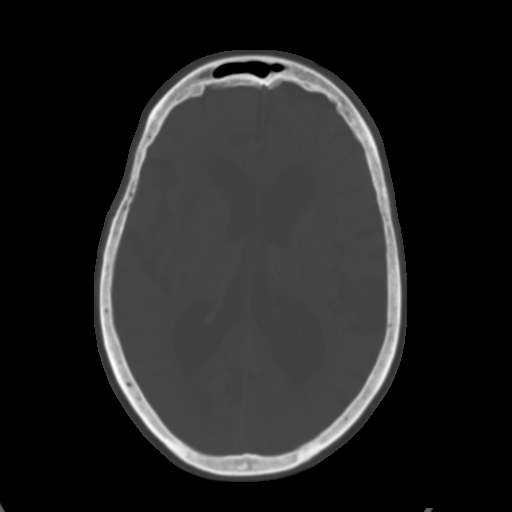
[im 20/32  brain]
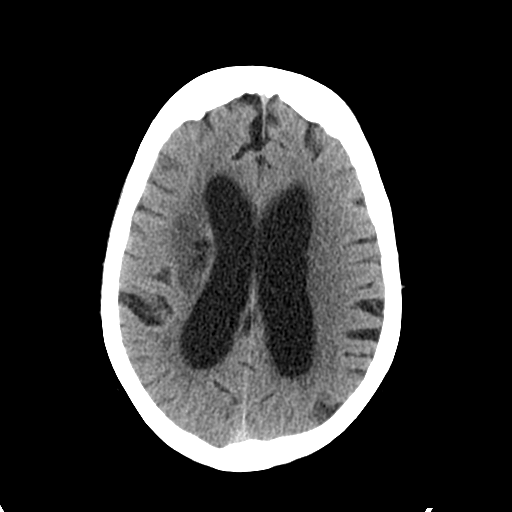
[im 23/32  brain]
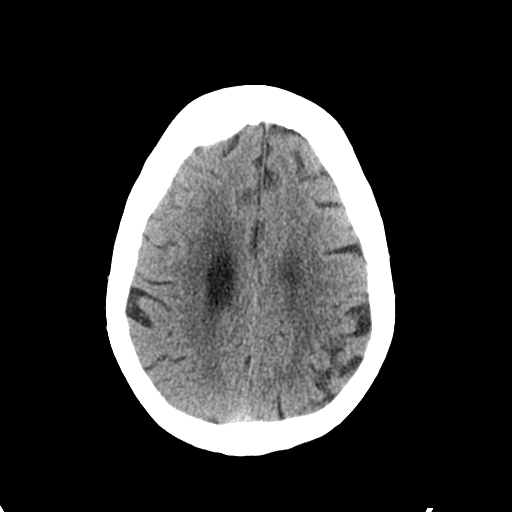
[im 26/32  brain]
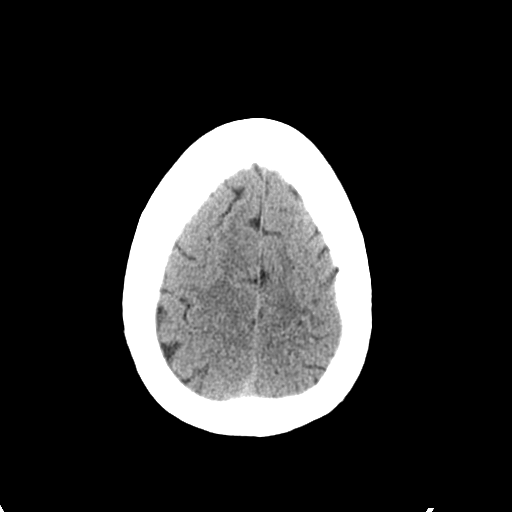
[im 29/32  brain]
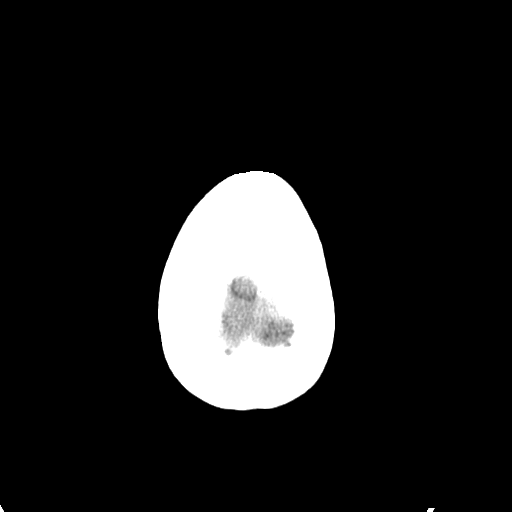
[im 29/32  bone]
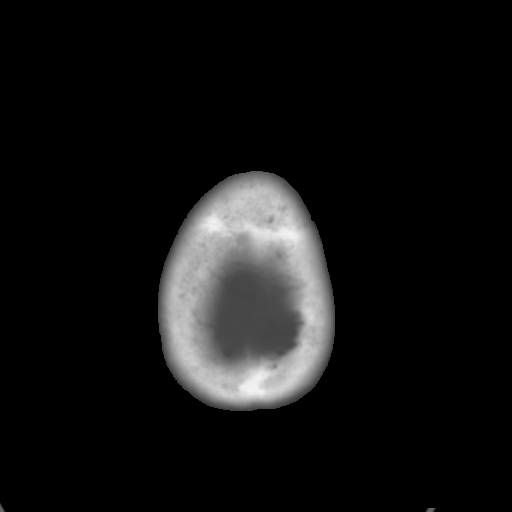

[Series 4: coronal soft tissue · coronal · 0.30mm/px · 3 of 69 slices shown]
[im 26/69  brain]
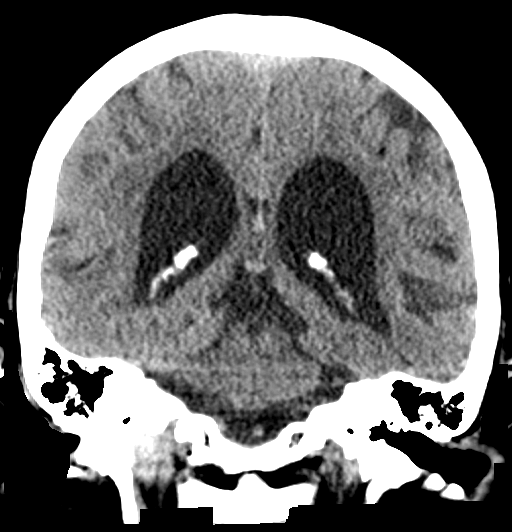
[im 32/69  brain]
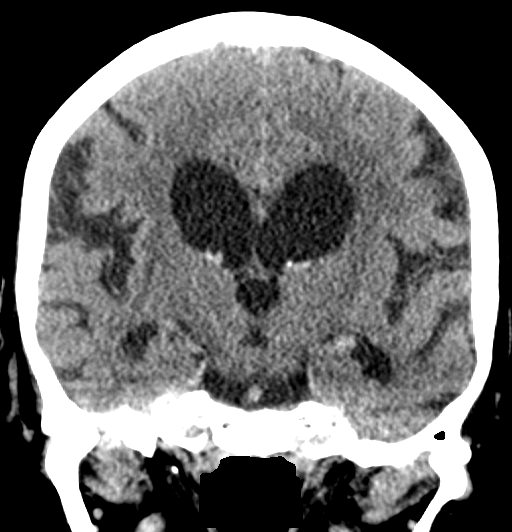
[im 38/69  brain]
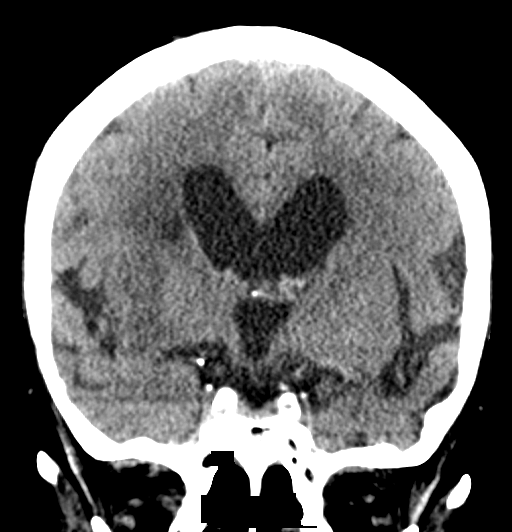

[Series 5: sagittal soft tissue · sagittal · 0.31mm/px · 3 of 52 slices shown]
[im 18/52  brain]
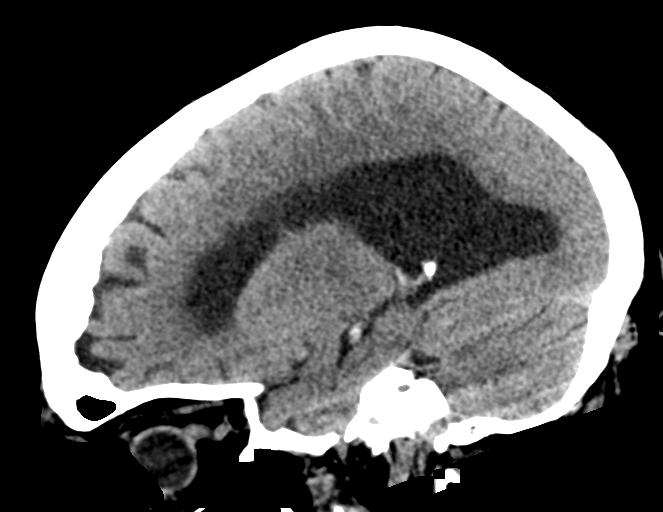
[im 26/52  brain]
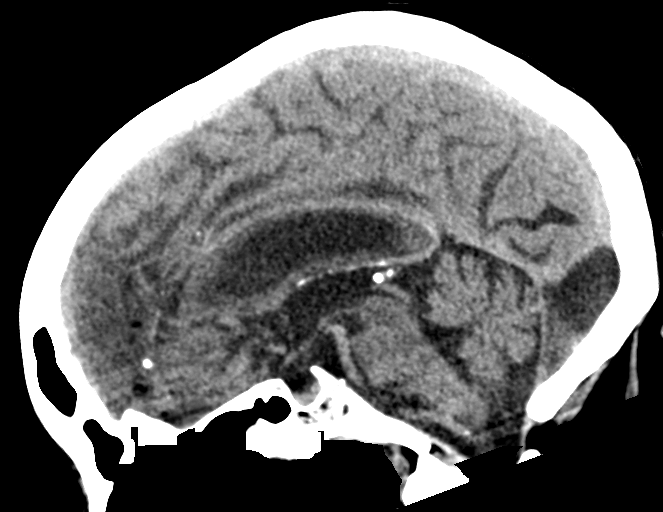
[im 35/52  brain]
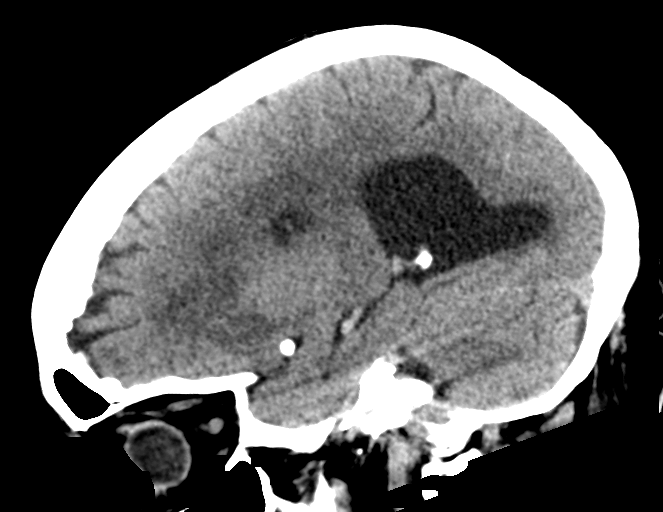

[15 of 47 positions shown; findings below may reference images not displayed]

FINDINGS: CT HEAD FINDINGS

Brain: Chronic atrophic and ischemic changes are again seen and
stable. Some decreased attenuation is noted in the subinsular region
on the right new from the prior exam likely related to the prior
ischemic event. Right MCA stent is noted in place. The previously
seen hemorrhage in the right basal ganglia has resolved. No new
infarct or acute hemorrhage is seen.

Vascular: Right MCA stent is noted stable in appearance from the
prior exam.

Skull: Normal. Negative for fracture or focal lesion.

Sinuses/Orbits: No acute finding.

Other: Soft tissue swelling is noted over the left orbit consistent
with the recent injury. No underlying fracture is seen.

CT CERVICAL SPINE FINDINGS

Alignment: Within normal limits.

Skull base and vertebrae: 7 cervical segments are well visualized.
Vertebral body height is well maintained. Mild osteophytes are noted
in the cervical spine. More marked osteophytes are noted in the
upper thoracic spine. No acute fracture or acute facet abnormality
is noted. Facet hypertrophic changes are seen particularly on the
right.

Soft tissues and spinal canal: The surrounding soft tissues
demonstrate vascular calcifications. No mass lesion is seen. No
focal hematoma is noted.

Upper chest: Visualized lung apices are within normal limits.

Other: None
IMPRESSION: CT of the head: Status post right MCA stent placement.

New areas of decreased attenuation are noted in the subinsular
region on the right consistent with the previous subacute ischemia.
No acute infarct is noted.

Previously seen parenchymal hemorrhage has resolved in the interval.

CT of the cervical spine: Multilevel degenerative change without
acute abnormality.
